# Patient Record
Sex: Male | Born: 1940
Health system: Southern US, Community
[De-identification: ages and names within clinical notes are randomized; demographics above are authoritative.]

## PROBLEM LIST (undated history)

## (undated) DIAGNOSIS — N289 Disorder of kidney and ureter, unspecified: Secondary | ICD-10-CM

## (undated) DIAGNOSIS — Z992 Dependence on renal dialysis: Secondary | ICD-10-CM

## (undated) DIAGNOSIS — I10 Essential (primary) hypertension: Secondary | ICD-10-CM

## (undated) DIAGNOSIS — I509 Heart failure, unspecified: Secondary | ICD-10-CM

## (undated) DIAGNOSIS — K219 Gastro-esophageal reflux disease without esophagitis: Secondary | ICD-10-CM

## (undated) DIAGNOSIS — I639 Cerebral infarction, unspecified: Secondary | ICD-10-CM

## (undated) DIAGNOSIS — I219 Acute myocardial infarction, unspecified: Secondary | ICD-10-CM

## (undated) DIAGNOSIS — H409 Unspecified glaucoma: Secondary | ICD-10-CM

## (undated) DIAGNOSIS — H3530 Unspecified macular degeneration: Secondary | ICD-10-CM

## (undated) DIAGNOSIS — N186 End stage renal disease: Secondary | ICD-10-CM

## (undated) HISTORY — PX: APPENDECTOMY: SHX54

## (undated) HISTORY — PX: OTHER SURGICAL HISTORY: SHX169

## (undated) HISTORY — DX: Unspecified glaucoma: H40.9

## (undated) HISTORY — PX: CORONARY ARTERY BYPASS GRAFT: SHX141

## (undated) HISTORY — DX: Unspecified macular degeneration: H35.30

---

## 1998-01-10 HISTORY — PX: CORONARY ARTERY BYPASS GRAFT: SHX141

## 2010-02-10 HISTORY — PX: ABDOMINAL AORTIC ANEURYSM REPAIR: SUR1152

## 2014-05-08 ENCOUNTER — Other Ambulatory Visit (HOSPITAL_COMMUNITY): Payer: Self-pay | Admitting: Nephrology

## 2014-05-08 DIAGNOSIS — N289 Disorder of kidney and ureter, unspecified: Secondary | ICD-10-CM

## 2014-05-22 ENCOUNTER — Ambulatory Visit (HOSPITAL_COMMUNITY)
Admission: RE | Admit: 2014-05-22 | Discharge: 2014-05-22 | Disposition: A | Discharge status: Home / Self Care | Payer: Medicare Other | Source: Ambulatory Visit | Attending: Nephrology | Admitting: Nephrology

## 2014-05-22 DIAGNOSIS — N400 Enlarged prostate without lower urinary tract symptoms: Secondary | ICD-10-CM | POA: Diagnosis not present

## 2014-05-22 DIAGNOSIS — N289 Disorder of kidney and ureter, unspecified: Secondary | ICD-10-CM

## 2014-05-22 DIAGNOSIS — N183 Chronic kidney disease, stage 3 (moderate): Secondary | ICD-10-CM | POA: Insufficient documentation

## 2014-06-14 DIAGNOSIS — R809 Proteinuria, unspecified: Secondary | ICD-10-CM | POA: Diagnosis not present

## 2014-06-14 DIAGNOSIS — I10 Essential (primary) hypertension: Secondary | ICD-10-CM | POA: Diagnosis not present

## 2014-06-14 DIAGNOSIS — N184 Chronic kidney disease, stage 4 (severe): Secondary | ICD-10-CM | POA: Diagnosis not present

## 2014-06-23 DIAGNOSIS — I10 Essential (primary) hypertension: Secondary | ICD-10-CM | POA: Diagnosis not present

## 2014-06-23 DIAGNOSIS — R972 Elevated prostate specific antigen [PSA]: Secondary | ICD-10-CM | POA: Diagnosis not present

## 2014-06-23 DIAGNOSIS — N189 Chronic kidney disease, unspecified: Secondary | ICD-10-CM | POA: Diagnosis not present

## 2014-06-23 DIAGNOSIS — E785 Hyperlipidemia, unspecified: Secondary | ICD-10-CM | POA: Diagnosis not present

## 2014-06-26 DIAGNOSIS — N184 Chronic kidney disease, stage 4 (severe): Secondary | ICD-10-CM | POA: Diagnosis not present

## 2014-06-26 DIAGNOSIS — N429 Disorder of prostate, unspecified: Secondary | ICD-10-CM | POA: Diagnosis not present

## 2014-06-26 DIAGNOSIS — E785 Hyperlipidemia, unspecified: Secondary | ICD-10-CM | POA: Diagnosis not present

## 2014-06-26 DIAGNOSIS — I10 Essential (primary) hypertension: Secondary | ICD-10-CM | POA: Diagnosis not present

## 2014-09-25 DIAGNOSIS — Z79899 Other long term (current) drug therapy: Secondary | ICD-10-CM | POA: Diagnosis not present

## 2014-09-25 DIAGNOSIS — R809 Proteinuria, unspecified: Secondary | ICD-10-CM | POA: Diagnosis not present

## 2014-09-25 DIAGNOSIS — N183 Chronic kidney disease, stage 3 (moderate): Secondary | ICD-10-CM | POA: Diagnosis not present

## 2014-09-25 DIAGNOSIS — I10 Essential (primary) hypertension: Secondary | ICD-10-CM | POA: Diagnosis not present

## 2014-09-25 DIAGNOSIS — N400 Enlarged prostate without lower urinary tract symptoms: Secondary | ICD-10-CM | POA: Diagnosis not present

## 2014-09-25 DIAGNOSIS — E559 Vitamin D deficiency, unspecified: Secondary | ICD-10-CM | POA: Diagnosis not present

## 2014-09-27 DIAGNOSIS — I10 Essential (primary) hypertension: Secondary | ICD-10-CM | POA: Diagnosis not present

## 2014-09-27 DIAGNOSIS — N184 Chronic kidney disease, stage 4 (severe): Secondary | ICD-10-CM | POA: Diagnosis not present

## 2014-09-27 DIAGNOSIS — R809 Proteinuria, unspecified: Secondary | ICD-10-CM | POA: Diagnosis not present

## 2014-10-30 DIAGNOSIS — I10 Essential (primary) hypertension: Secondary | ICD-10-CM | POA: Diagnosis not present

## 2014-10-30 DIAGNOSIS — E782 Mixed hyperlipidemia: Secondary | ICD-10-CM | POA: Diagnosis not present

## 2014-11-01 DIAGNOSIS — E782 Mixed hyperlipidemia: Secondary | ICD-10-CM | POA: Diagnosis not present

## 2014-11-01 DIAGNOSIS — R972 Elevated prostate specific antigen [PSA]: Secondary | ICD-10-CM | POA: Diagnosis not present

## 2014-11-01 DIAGNOSIS — K210 Gastro-esophageal reflux disease with esophagitis: Secondary | ICD-10-CM | POA: Diagnosis not present

## 2014-11-01 DIAGNOSIS — I10 Essential (primary) hypertension: Secondary | ICD-10-CM | POA: Diagnosis not present

## 2014-11-29 DIAGNOSIS — I10 Essential (primary) hypertension: Secondary | ICD-10-CM | POA: Diagnosis not present

## 2014-11-29 DIAGNOSIS — N400 Enlarged prostate without lower urinary tract symptoms: Secondary | ICD-10-CM | POA: Diagnosis not present

## 2014-11-29 DIAGNOSIS — N184 Chronic kidney disease, stage 4 (severe): Secondary | ICD-10-CM | POA: Diagnosis not present

## 2014-11-29 DIAGNOSIS — R972 Elevated prostate specific antigen [PSA]: Secondary | ICD-10-CM | POA: Diagnosis not present

## 2014-12-06 ENCOUNTER — Ambulatory Visit: Payer: Medicare Other | Admitting: Urology

## 2015-01-01 DIAGNOSIS — R809 Proteinuria, unspecified: Secondary | ICD-10-CM | POA: Diagnosis not present

## 2015-01-01 DIAGNOSIS — N183 Chronic kidney disease, stage 3 (moderate): Secondary | ICD-10-CM | POA: Diagnosis not present

## 2015-01-01 DIAGNOSIS — E559 Vitamin D deficiency, unspecified: Secondary | ICD-10-CM | POA: Diagnosis not present

## 2015-01-01 DIAGNOSIS — D509 Iron deficiency anemia, unspecified: Secondary | ICD-10-CM | POA: Diagnosis not present

## 2015-01-01 DIAGNOSIS — D649 Anemia, unspecified: Secondary | ICD-10-CM | POA: Diagnosis not present

## 2015-01-03 DIAGNOSIS — N184 Chronic kidney disease, stage 4 (severe): Secondary | ICD-10-CM | POA: Diagnosis not present

## 2015-01-03 DIAGNOSIS — I10 Essential (primary) hypertension: Secondary | ICD-10-CM | POA: Diagnosis not present

## 2015-01-03 DIAGNOSIS — E559 Vitamin D deficiency, unspecified: Secondary | ICD-10-CM | POA: Diagnosis not present

## 2015-01-03 DIAGNOSIS — R809 Proteinuria, unspecified: Secondary | ICD-10-CM | POA: Diagnosis not present

## 2015-03-02 DIAGNOSIS — E785 Hyperlipidemia, unspecified: Secondary | ICD-10-CM | POA: Diagnosis not present

## 2015-03-02 DIAGNOSIS — I10 Essential (primary) hypertension: Secondary | ICD-10-CM | POA: Diagnosis not present

## 2015-04-07 DIAGNOSIS — E559 Vitamin D deficiency, unspecified: Secondary | ICD-10-CM | POA: Diagnosis not present

## 2015-04-07 DIAGNOSIS — Z79899 Other long term (current) drug therapy: Secondary | ICD-10-CM | POA: Diagnosis not present

## 2015-04-07 DIAGNOSIS — R809 Proteinuria, unspecified: Secondary | ICD-10-CM | POA: Diagnosis not present

## 2015-04-07 DIAGNOSIS — I10 Essential (primary) hypertension: Secondary | ICD-10-CM | POA: Diagnosis not present

## 2015-04-07 DIAGNOSIS — N183 Chronic kidney disease, stage 3 (moderate): Secondary | ICD-10-CM | POA: Diagnosis not present

## 2015-04-11 DIAGNOSIS — E559 Vitamin D deficiency, unspecified: Secondary | ICD-10-CM | POA: Diagnosis not present

## 2015-04-11 DIAGNOSIS — R809 Proteinuria, unspecified: Secondary | ICD-10-CM | POA: Diagnosis not present

## 2015-04-11 DIAGNOSIS — N184 Chronic kidney disease, stage 4 (severe): Secondary | ICD-10-CM | POA: Diagnosis not present

## 2015-04-11 DIAGNOSIS — I10 Essential (primary) hypertension: Secondary | ICD-10-CM | POA: Diagnosis not present

## 2015-05-05 DIAGNOSIS — N184 Chronic kidney disease, stage 4 (severe): Secondary | ICD-10-CM | POA: Diagnosis not present

## 2015-05-05 DIAGNOSIS — I10 Essential (primary) hypertension: Secondary | ICD-10-CM | POA: Diagnosis not present

## 2015-05-05 DIAGNOSIS — K219 Gastro-esophageal reflux disease without esophagitis: Secondary | ICD-10-CM | POA: Diagnosis not present

## 2015-05-05 DIAGNOSIS — E785 Hyperlipidemia, unspecified: Secondary | ICD-10-CM | POA: Diagnosis not present

## 2015-07-23 DIAGNOSIS — Z79899 Other long term (current) drug therapy: Secondary | ICD-10-CM | POA: Diagnosis not present

## 2015-07-23 DIAGNOSIS — E559 Vitamin D deficiency, unspecified: Secondary | ICD-10-CM | POA: Diagnosis not present

## 2015-07-23 DIAGNOSIS — D509 Iron deficiency anemia, unspecified: Secondary | ICD-10-CM | POA: Diagnosis not present

## 2015-07-23 DIAGNOSIS — R809 Proteinuria, unspecified: Secondary | ICD-10-CM | POA: Diagnosis not present

## 2015-07-23 DIAGNOSIS — N183 Chronic kidney disease, stage 3 (moderate): Secondary | ICD-10-CM | POA: Diagnosis not present

## 2015-07-25 DIAGNOSIS — I10 Essential (primary) hypertension: Secondary | ICD-10-CM | POA: Diagnosis not present

## 2015-07-25 DIAGNOSIS — N184 Chronic kidney disease, stage 4 (severe): Secondary | ICD-10-CM | POA: Diagnosis not present

## 2015-07-25 DIAGNOSIS — R809 Proteinuria, unspecified: Secondary | ICD-10-CM | POA: Diagnosis not present

## 2015-07-25 DIAGNOSIS — E559 Vitamin D deficiency, unspecified: Secondary | ICD-10-CM | POA: Diagnosis not present

## 2015-08-30 DIAGNOSIS — K4020 Bilateral inguinal hernia, without obstruction or gangrene, not specified as recurrent: Secondary | ICD-10-CM | POA: Diagnosis not present

## 2015-08-30 DIAGNOSIS — K4090 Unilateral inguinal hernia, without obstruction or gangrene, not specified as recurrent: Secondary | ICD-10-CM | POA: Diagnosis not present

## 2015-09-11 DIAGNOSIS — K4090 Unilateral inguinal hernia, without obstruction or gangrene, not specified as recurrent: Secondary | ICD-10-CM | POA: Diagnosis not present

## 2015-09-12 ENCOUNTER — Encounter (HOSPITAL_COMMUNITY)
Admission: RE | Admit: 2015-09-12 | Discharge: 2015-09-12 | Disposition: A | Discharge status: Home / Self Care | Payer: Medicare Other | Source: Ambulatory Visit | Attending: Surgery | Admitting: Surgery

## 2015-09-12 ENCOUNTER — Encounter (HOSPITAL_COMMUNITY): Payer: Self-pay

## 2015-09-12 DIAGNOSIS — Z01812 Encounter for preprocedural laboratory examination: Secondary | ICD-10-CM | POA: Insufficient documentation

## 2015-09-12 DIAGNOSIS — Z01810 Encounter for preprocedural cardiovascular examination: Secondary | ICD-10-CM | POA: Diagnosis not present

## 2015-09-12 HISTORY — DX: Cerebral infarction, unspecified: I63.9

## 2015-09-12 HISTORY — DX: Gastro-esophageal reflux disease without esophagitis: K21.9

## 2015-09-12 HISTORY — DX: Essential (primary) hypertension: I10

## 2015-09-12 HISTORY — DX: Acute myocardial infarction, unspecified: I21.9

## 2015-09-12 LAB — COMPREHENSIVE METABOLIC PANEL
ALK PHOS: 63 U/L (ref 38–126)
ALT: 17 U/L (ref 17–63)
ANION GAP: 5 (ref 5–15)
AST: 16 U/L (ref 15–41)
Albumin: 3.7 g/dL (ref 3.5–5.0)
BILIRUBIN TOTAL: 0.5 mg/dL (ref 0.3–1.2)
BUN: 30 mg/dL — ABNORMAL HIGH (ref 6–20)
CALCIUM: 8.9 mg/dL (ref 8.9–10.3)
CO2: 25 mmol/L (ref 22–32)
Chloride: 107 mmol/L (ref 101–111)
Creatinine, Ser: 2.11 mg/dL — ABNORMAL HIGH (ref 0.61–1.24)
GFR, EST AFRICAN AMERICAN: 34 mL/min — AB (ref >60)
GFR, EST NON AFRICAN AMERICAN: 29 mL/min — AB (ref >60)
Glucose, Bld: 84 mg/dL (ref 65–99)
POTASSIUM: 4 mmol/L (ref 3.5–5.1)
Sodium: 137 mmol/L (ref 135–145)
TOTAL PROTEIN: 6.8 g/dL (ref 6.5–8.1)

## 2015-09-12 LAB — CBC WITH DIFFERENTIAL/PLATELET
BASOS PCT: 0 %
Basophils Absolute: 0 10*3/uL (ref 0.0–0.1)
EOS ABS: 0.2 10*3/uL (ref 0.0–0.7)
Eosinophils Relative: 3 %
HEMATOCRIT: 42.3 % (ref 39.0–52.0)
HEMOGLOBIN: 14.4 g/dL (ref 13.0–17.0)
LYMPHS ABS: 1.3 10*3/uL (ref 0.7–4.0)
Lymphocytes Relative: 19 %
MCH: 30.1 pg (ref 26.0–34.0)
MCHC: 34 g/dL (ref 30.0–36.0)
MCV: 88.3 fL (ref 78.0–100.0)
MONO ABS: 0.6 10*3/uL (ref 0.1–1.0)
MONOS PCT: 8 %
NEUTROS ABS: 4.6 10*3/uL (ref 1.7–7.7)
NEUTROS PCT: 70 %
Platelets: 151 10*3/uL (ref 150–400)
RBC: 4.79 MIL/uL (ref 4.22–5.81)
RDW: 14.2 % (ref 11.5–15.5)
WBC: 6.7 10*3/uL (ref 4.0–10.5)

## 2015-09-12 NOTE — Pre-Procedure Instructions (Signed)
Patient in for PAT. He is on plavix and states he was no instructed to stop this. Dr Rosana Hoes called and states he did not know patient was on plavix. Patient took plavix this morning and will need to be rescheduled to next week due to this. Surgery rescheduled until 09/20/2015.  Patient and wife given verbal and written instructions to stop plavix and take no more prior to surgery. Both verbalized understanding of this.

## 2015-09-12 NOTE — Pre-Procedure Instructions (Signed)
Patient given information to sign up for my chart at home. 

## 2015-09-12 NOTE — Patient Instructions (Addendum)
Cornellius Limpert Knickerbocker  09/12/2015     @PREFPERIOPPHARMACY @   Your procedure is scheduled on  8/10 /2017   Report to [facility] at  615  A.M.  Call this number if you have problems the morning of surgery:  [phone]   Remember:  Do not eat food or drink liquids after midnight.  Take these medicines the morning of surgery with A SIP OF WATER :norvasc, coreg, lisinpril, protonix, vesicare, ultram. DO NOT take any more plavix before surgery.   Do not wear jewelry, make-up or nail polish.  Do not wear lotions, powders, or perfumes.  You may wear deoderant.  Do not shave 48 hours prior to surgery.  Men may shave face and neck.  Do not bring valuables to the hospital.  [facility] is not responsible for any belongings or valuables.  Contacts, dentures or bridgework may not be worn into surgery.  Leave your suitcase in the car.  After surgery it may be brought to your room.  For patients admitted to the hospital, discharge time will be determined by your treatment team.  Patients discharged the day of surgery will not be allowed to drive home.   Name and phone number of your driver:   family Special instructions:  none  Please read over the following fact sheets that you were given. Coughing and Deep Breathing, Surgical Site Infection Prevention, Anesthesia Post-op Instructions and Care and Recovery After Surgery      Open Hernia Repair Open hernia repair is surgery to fix a hernia. A hernia occurs when an internal organ or tissue pushes out through a weak spot in the abdominal wall muscles. Hernias commonly occur in the groin and around the navel. Most hernias tend to get worse over time. Surgery is often done to prevent the hernia from getting bigger, becoming uncomfortable, or becoming an emergency. Emergency surgery may be needed if abdominal contents get stuck in the opening (incarcerated hernia) or the blood supply gets cut off (strangulated hernia). In an open  repair, a large cut (incision) is made in the abdomen to perform the surgery. LET [facility] CARE PROVIDER KNOW ABOUT:  Any allergies you have.  All medicines you are taking, including vitamins, herbs, eye drops, creams, and over-the-counter medicines.  Previous problems you or members of your family have had with the use of anesthetics.  Any blood disorders you have.  Previous surgeries you have had.  Medical conditions you have. RISKS AND COMPLICATIONS Generally, this is a safe procedure. However, as with any procedure, complications can occur. Possible complications include:  Infection.  Bleeding.  Nerve injury.  Chronic pain.  The hernia can come back.  Injury to the intestines. BEFORE THE PROCEDURE  Ask your health care provider about changing or stopping any regular medicines. Avoid taking aspirin or blood thinners as directed by your health care provider.  Do noteat or drink anything after midnight the night before surgery.  If you smoke, do not smoke for at least 2 weeks before your surgery.  Do not drink alcohol the day before your surgery.  Let your health care provider know if you develop a cold or any infection before your surgery.  Arrange for someone to drive you home after the procedure or after your hospital stay. Also arrange for someone to help you with activities during recovery. PROCEDURE   Small monitors will be put on your body. They are used to check your heart, blood pressure,  and oxygen level.   An IV access tube will be put into one of your veins. Medicine will be able to flow directly into your body through this IV tube.   You might be given a medicine to help you relax (sedative).   You will be given a medicine to make you sleep (general anesthetic). A breathing tube may be placed into your lungs during the procedure.  A cut (incision) is made over the hernia defect, and the contents are pushed back into the abdomen.  If the hernia  is small, stitches may be used to bring the muscle edges back together.  Typically, a surgeon will place a mesh patch made of man-made material (synthetic) to cover the defect. The mesh is sewn to healthy muscle. This reduces the risk of the hernia coming back.  The tissue and skin over the hernia are then closed with stitches or staples.  If the hernia was large, a drain may be left in place to collect excess fluid where the hernia used to be.  Bandages (dressings) are used to cover the incision. AFTER THE PROCEDURE  You will be taken to a recovery area where your progress will be monitored.  If the hernia was small or in the groin (inguinal) region, you will likely be allowed to go home once you are awake, stable, and taking fluids well.  If the hernia was large, you may have to wait for your bowel function to return. You may need to stay in the hospital for 2-3 days until you can eat and your pain is controlled. A drain may be left in place for 5-7 days. You will be taught how to care for the drain.   This information is not intended to replace advice given to you by your health care provider. Make sure you discuss any questions you have with your health care provider.   Document Released: 07/23/2000 Document Revised: 11/17/2012 Document Reviewed: 09/08/2012 Elsevier Interactive Patient Education 2016 [place].  Open Hernia Repair, Care After These instructions give you information about caring for yourself after your procedure. Your doctor may also give you more specific instructions. Call your doctor if you have any problems or questions after your procedure. HOME CARE  Keep the cut (incision) area clean and dry. You may gently wash the incision area with soap and water 48 hours after surgery. To dry the incision area, gently blot or dab it.  Do not take baths, swim, or use a hot tub for 10 days or until your doctor approves.  Change bandages (dressings) as told by your  doctor.  Check your incision area every day for signs of infection. Watch for:  Redness, swelling, or pain.  Fluid , blood, or pus.  Eat plenty of fruits and vegetables. This helps to prevent constipation.  Drink enough fluid to keep your pee (urine) clear or pale yellow. This also helps to prevent constipation.  Do not drive or operate heavy machinery until your doctor says it is okay.  Do not lift anything that is heavier than 10 lb (4.5 kg) until your doctor approves.  Do not play contact sports for 4 weeks or until your doctor approves.  Take medicines only as told by your doctor.  Keep all follow-up visits as told by your doctor. This is important. Ask your doctor when to make an appointment to have your stitches (sutures) or staples removed. GET HELP IF:  The incision is bleeding more than before.  You have blood  in your poop (stool).  The incision hurts more than before.  You have redness, swelling, or pain in your incision area.  You have fluid, blood, or pus coming from your incision.  You have a fever.  You notice a bad smell coming from the incision area or the dressing. GET HELP RIGHT AWAY IF:  You have a rash.  Your chest hurts.  You are short of breath.  You feel light-headed.  You feel weak and dizzy (feel faint).   This information is not intended to replace advice given to you by your health care provider. Make sure you discuss any questions you have with your health care provider.   Document Released: 02/17/2014 Document Reviewed: 02/17/2014 Elsevier Interactive Patient Education 2016 [employer] PATIENT INSTRUCTIONS POST-ANESTHESIA  IMMEDIATELY FOLLOWING SURGERY:  Do not drive or operate machinery for the first twenty four hours after surgery.  Do not make any important decisions for twenty four hours after surgery or while taking narcotic pain medications or sedatives.  If you develop intractable nausea and vomiting or a severe headache  please notify your doctor immediately.  FOLLOW-UP:  Please make an appointment with your surgeon as instructed. You do not need to follow up with anesthesia unless specifically instructed to do so.  WOUND CARE INSTRUCTIONS (if applicable):  Keep a dry clean dressing on the anesthesia/puncture wound site if there is drainage.  Once the wound has quit draining you may leave it open to air.  Generally you should leave the bandage intact for twenty four hours unless there is drainage.  If the epidural site drains for more than 36-48 hours please call the anesthesia department.  QUESTIONS?:  Please feel free to call your physician or the hospital operator if you have any questions, and they will be happy to assist you.

## 2015-09-18 NOTE — H&P (Signed)
Surgical History and Physical  Patient Name: Ronald [name] Date of Birth: 1940/04/02  Subjective: This 75 year old male presents for followup. During his first (previous) visit for evaluation of a painful Right inguinal hernia, he expressed relief to know and to be shown that his hernia could easily be reduced and self-reduced. Since that visit, patient reports he's been reducing his hernia frequently and hasn't experienced any further pain associated with the hernia, but he says that he is tired of reducing it so frequently and just wants it fixed. Patient denies any change in bowel function or fever/chills. Though he underwent CABG 18 years ago, he denies any CP or SOB with walking his dog for what he describes to be up to 1-2 miles and describes that he hasn't seen a cardiologist for >3 years.   (Prior Subjective) 75 year old male presents for Right groin pain x 3 days that began after some heavy lifting in his yard. He reports that he's never before experienced pain like this and that nothing has helped improve the pain, while straining, moving, and lifting all worsen the pain. He denies any nausea/vomiting or fever/chills during this time and reports +flatus and normal BM. Patient saw his PCP this morning and was referred for further evaluation.Patient denies any constipation or straining with bowel movements and denies any recent coughing. He does report some recent straining with urination, but states that it resolved with an over-the-counter medication of which he coulnd't remember its name. He also reports that he has never had a colonoscopy.   Review of Symptoms:  Constitutional:No fevers, chills, or unexplained weight loss Head:Atraumatic; no masses; no abnormalities Eyes:No visual changes or eye pain Nose/Mouth/Throat:No nasal congestion, rhinorrhea, oral lesions, postnasal drip or sore throat  Cardiovascular: No chest pain or palpitations.  Respiratory:No cough,  shortness of breath or wheezing   Right groin pain as per HPI Genitourinary:No urinary frequency, hematuria, incontinence, or dysuria Musculoskeletal:No arthalgias, myalgias or joint swelling Skin:No rash or bothersome skin lesions Hematolgic/Lymphatic:No easy bruising, easy bleeding or swollen glands   Past Medical History:Reviewed  Past Medical History  Surgical History:  CABG (1999), an unspecified catheter-based vascular procedure through B/L groin incisions that was reportedly aborted after patient reportedly experienced an intra-procedural stroke Medical Problems:  CAD, CKD (stage 4), HTN, hyperlipidemia, GERD, BPH, elevated PSA   Social History:Reviewed  Social History  Preferred Language: English Race:  White Ethnicity: Not Hispanic / Latino Age: 37 year Marital Status:  M Smoking Status: Current every day smoker reviewed on 08/30/2015 Started Date: [birth year]30/1953  Functional Status ------------------------------------------------ Bathing: Normal Cooking: Normal Dressing: Normal Driving: Normal Eating: Normal Managing Meds: Normal Oral Care: Normal Shopping: Normal Toileting: Normal Transferring: Normal Walking: Normal  Cognitive Status ------------------------------------------------ Attention: Normal Decision Making: Normal Language: Normal Memory: Normal Motor: Normal Perception: Normal Problem Solving: Normal Visual and Spatial: Normal   Family History:Reviewed  Family Health History Both of patient's parents are deceased, but Family History is otherwise unknown  Vital Signs as of [id number]:  Systolic [id number]: Diastolic 70: Heart Rate 59: Temp 36.89C (Temporal) Height 185CM: Weight 82.1KG: Pain Level 2   BMI: 23.88 kg/m2  Physical Exam: General:Well appearing, well nourished in no distress. Skin:no rash or prominent lesions Head:Atraumatic; no masses; no abnormalities Eyes:conjunctiva clear, EOM intact, PERRL Mouth:Mucous  membranes moist, no mucosal lesions. Throat:no erythema, exudates or lesions. Neck:Supple without lymphadenopathy.  Heart:RRR, no murmur Lungs:CTA bilaterally, no wheezes, rhonchi, rales.  Breathing unlabored. Abdomen:Soft, NT/ND, no HSM, no masses.Easily reduced Right inguinal hernia,  tender prior to reduction, completely non-tender even with firm pressure applied following reduction, felt on exam to be mostly likely consistent with an indirect hernia Extremities:No deformities, clubbing, cyanosis, or edema.   Assessment:  75 year old Male with symptomatic (painful), though easily reducible, Right inguinal hernia, complicated by history including CAD s/p CABG 18 years ago, but no CP, SOB, or recent cardiac or coronary studies or interventions.  Plan:       - screening colonoscopy long overdue and recommended      - hold Plavix x 5 - 7 days prior to elective inguinal hernia repair      - call office or go to ED if can't reduce hernia or if pain + loss of bowel function      - all risks, benefits, and alternatives to above elective procedure were discussed with the patient, who elects to proceed, all of his questions were answered to his expressed satisfaction, and informed consent was obtained      - patient states that he smokes very little any longer, but complete smoking cessation again reiterated, both for long-term health benefits and to reduce peri-operative risk of pulmonary and wound complications      - will plan for Right inguinal hernia repair with mesh next week      - surgical follow-up 2 weeks after planned surgery  -- Corene Cornea E. Rosana Hoes, MD, [place]: [place] [facility] Office #: [phone]

## 2015-09-20 ENCOUNTER — Ambulatory Visit (HOSPITAL_COMMUNITY)
Admission: RE | Admit: 2015-09-20 | Discharge: 2015-09-20 | Disposition: A | Discharge status: Home / Self Care | Payer: Medicare Other | Source: Ambulatory Visit | Attending: Surgery | Admitting: Surgery

## 2015-09-20 ENCOUNTER — Encounter (HOSPITAL_COMMUNITY)
Admission: RE | Disposition: A | Discharge status: Home / Self Care | Payer: Self-pay | Source: Ambulatory Visit | Attending: Surgery

## 2015-09-20 ENCOUNTER — Ambulatory Visit (HOSPITAL_COMMUNITY): Payer: Medicare Other | Admitting: Anesthesiology

## 2015-09-20 ENCOUNTER — Encounter (HOSPITAL_COMMUNITY): Payer: Self-pay | Admitting: *Deleted

## 2015-09-20 DIAGNOSIS — I129 Hypertensive chronic kidney disease with stage 1 through stage 4 chronic kidney disease, or unspecified chronic kidney disease: Secondary | ICD-10-CM | POA: Insufficient documentation

## 2015-09-20 DIAGNOSIS — N184 Chronic kidney disease, stage 4 (severe): Secondary | ICD-10-CM | POA: Insufficient documentation

## 2015-09-20 DIAGNOSIS — I252 Old myocardial infarction: Secondary | ICD-10-CM | POA: Insufficient documentation

## 2015-09-20 DIAGNOSIS — K4090 Unilateral inguinal hernia, without obstruction or gangrene, not specified as recurrent: Secondary | ICD-10-CM | POA: Diagnosis not present

## 2015-09-20 DIAGNOSIS — I2510 Atherosclerotic heart disease of native coronary artery without angina pectoris: Secondary | ICD-10-CM | POA: Insufficient documentation

## 2015-09-20 DIAGNOSIS — F17210 Nicotine dependence, cigarettes, uncomplicated: Secondary | ICD-10-CM | POA: Insufficient documentation

## 2015-09-20 DIAGNOSIS — K219 Gastro-esophageal reflux disease without esophagitis: Secondary | ICD-10-CM | POA: Insufficient documentation

## 2015-09-20 DIAGNOSIS — I739 Peripheral vascular disease, unspecified: Secondary | ICD-10-CM | POA: Insufficient documentation

## 2015-09-20 DIAGNOSIS — E785 Hyperlipidemia, unspecified: Secondary | ICD-10-CM | POA: Diagnosis not present

## 2015-09-20 DIAGNOSIS — Z951 Presence of aortocoronary bypass graft: Secondary | ICD-10-CM | POA: Diagnosis not present

## 2015-09-20 DIAGNOSIS — Z7902 Long term (current) use of antithrombotics/antiplatelets: Secondary | ICD-10-CM | POA: Diagnosis not present

## 2015-09-20 DIAGNOSIS — Z8673 Personal history of transient ischemic attack (TIA), and cerebral infarction without residual deficits: Secondary | ICD-10-CM | POA: Insufficient documentation

## 2015-09-20 HISTORY — PX: INGUINAL HERNIA REPAIR: SHX194

## 2015-09-20 SURGERY — REPAIR, HERNIA, INGUINAL, ADULT
Anesthesia: General | Site: Groin | Laterality: Right

## 2015-09-20 MED ORDER — BUPIVACAINE HCL (PF) 0.5 % IJ SOLN
INTRAMUSCULAR | Status: AC
  Filled 2015-09-20: qty 30

## 2015-09-20 MED ORDER — FENTANYL CITRATE (PF) 100 MCG/2ML IJ SOLN
INTRAMUSCULAR | Status: AC
  Filled 2015-09-20: qty 2

## 2015-09-20 MED ORDER — HYDRALAZINE HCL 20 MG/ML IJ SOLN
INTRAMUSCULAR | Status: AC
  Filled 2015-09-20: qty 1

## 2015-09-20 MED ORDER — LIDOCAINE HCL (CARDIAC) 20 MG/ML IV SOLN
INTRAVENOUS | Status: DC | PRN
  Administered 2015-09-20: 25 mg via INTRAVENOUS

## 2015-09-20 MED ORDER — ONDANSETRON HCL 4 MG/2ML IJ SOLN: 4.0000 mg | Freq: Once | INTRAMUSCULAR | Status: DC | PRN

## 2015-09-20 MED ORDER — SUCCINYLCHOLINE CHLORIDE 20 MG/ML IJ SOLN
INTRAMUSCULAR | Status: AC
  Filled 2015-09-20: qty 1

## 2015-09-20 MED ORDER — LACTATED RINGERS IV SOLN
INTRAVENOUS | Status: DC
  Administered 2015-09-20: 1000 mL via INTRAVENOUS

## 2015-09-20 MED ORDER — SODIUM CHLORIDE 0.9 % IR SOLN
Status: DC | PRN
  Administered 2015-09-20: 500 mL

## 2015-09-20 MED ORDER — CHLORHEXIDINE GLUCONATE CLOTH 2 % EX PADS: 6.0000 | MEDICATED_PAD | Freq: Once | CUTANEOUS | Status: DC

## 2015-09-20 MED ORDER — FENTANYL CITRATE (PF) 100 MCG/2ML IJ SOLN
25.0000 ug | INTRAMUSCULAR | Status: DC | PRN
  Administered 2015-09-20 (×2): 50 ug via INTRAVENOUS

## 2015-09-20 MED ORDER — CEFAZOLIN SODIUM-DEXTROSE 2-4 GM/100ML-% IV SOLN
2.0000 g | INTRAVENOUS | Status: AC
  Administered 2015-09-20: 2 g via INTRAVENOUS

## 2015-09-20 MED ORDER — HYDRALAZINE HCL 20 MG/ML IJ SOLN
5.0000 mg | Freq: Once | INTRAMUSCULAR | Status: AC
  Administered 2015-09-20: 5 mg via INTRAVENOUS

## 2015-09-20 MED ORDER — LABETALOL HCL 5 MG/ML IV SOLN
5.0000 mg | Freq: Once | INTRAVENOUS | Status: AC
  Administered 2015-09-20: 5 mg via INTRAVENOUS

## 2015-09-20 MED ORDER — EPHEDRINE SULFATE 50 MG/ML IJ SOLN
INTRAMUSCULAR | Status: AC
  Filled 2015-09-20: qty 1

## 2015-09-20 MED ORDER — GLYCOPYRROLATE 0.2 MG/ML IJ SOLN
INTRAMUSCULAR | Status: DC | PRN
  Administered 2015-09-20: 0.2 mg via INTRAVENOUS

## 2015-09-20 MED ORDER — PHENYLEPHRINE 40 MCG/ML (10ML) SYRINGE FOR IV PUSH (FOR BLOOD PRESSURE SUPPORT)
PREFILLED_SYRINGE | INTRAVENOUS | Status: AC
  Filled 2015-09-20: qty 10

## 2015-09-20 MED ORDER — TRAMADOL HCL 50 MG PO TABS: 50.0000 mg | ORAL_TABLET | Freq: Four times a day (QID) | ORAL | 0 refills | Status: DC | PRN

## 2015-09-20 MED ORDER — CEFAZOLIN SODIUM-DEXTROSE 2-4 GM/100ML-% IV SOLN
INTRAVENOUS | Status: AC
  Filled 2015-09-20: qty 100

## 2015-09-20 MED ORDER — MIDAZOLAM HCL 2 MG/2ML IJ SOLN
INTRAMUSCULAR | Status: AC
  Filled 2015-09-20: qty 2

## 2015-09-20 MED ORDER — PROPOFOL 10 MG/ML IV BOLUS
INTRAVENOUS | Status: AC
  Filled 2015-09-20: qty 40

## 2015-09-20 MED ORDER — LABETALOL HCL 5 MG/ML IV SOLN
INTRAVENOUS | Status: AC
  Filled 2015-09-20: qty 4

## 2015-09-20 MED ORDER — LIDOCAINE HCL (PF) 1 % IJ SOLN
INTRAMUSCULAR | Status: AC
  Filled 2015-09-20: qty 5

## 2015-09-20 MED ORDER — SODIUM CHLORIDE 0.9 % IJ SOLN
INTRAMUSCULAR | Status: AC
  Filled 2015-09-20: qty 10

## 2015-09-20 MED ORDER — LACTATED RINGERS IV SOLN
INTRAVENOUS | Status: DC | PRN
  Administered 2015-09-20: 07:00:00 via INTRAVENOUS

## 2015-09-20 MED ORDER — FENTANYL CITRATE (PF) 100 MCG/2ML IJ SOLN
INTRAMUSCULAR | Status: DC | PRN
  Administered 2015-09-20 (×2): 25 ug via INTRAVENOUS
  Administered 2015-09-20: 50 ug via INTRAVENOUS

## 2015-09-20 MED ORDER — LIDOCAINE HCL 1 % IJ SOLN
INTRAMUSCULAR | Status: DC | PRN
  Administered 2015-09-20: 20 mL via INTRAMUSCULAR

## 2015-09-20 MED ORDER — PROPOFOL 10 MG/ML IV BOLUS
INTRAVENOUS | Status: DC | PRN
  Administered 2015-09-20: 130 mg via INTRAVENOUS

## 2015-09-20 MED ORDER — LIDOCAINE HCL (PF) 1 % IJ SOLN
INTRAMUSCULAR | Status: AC
  Filled 2015-09-20: qty 30

## 2015-09-20 MED ORDER — MIDAZOLAM HCL 2 MG/2ML IJ SOLN
1.0000 mg | INTRAMUSCULAR | Status: DC | PRN
Start: 2015-09-20 — End: 2015-09-22
  Administered 2015-09-20: 2 mg via INTRAVENOUS

## 2015-09-20 MED ORDER — FENTANYL CITRATE (PF) 100 MCG/2ML IJ SOLN
25.0000 ug | INTRAMUSCULAR | Status: AC | PRN
  Administered 2015-09-20 (×2): 25 ug via INTRAVENOUS

## 2015-09-20 SURGICAL SUPPLY — 42 items
BAG HAMPER (MISCELLANEOUS) ×2 IMPLANT
BLADE SURG SZ10 CARB STEEL (BLADE) ×2 IMPLANT
CHLORAPREP W/TINT 26ML (MISCELLANEOUS) ×4 IMPLANT
CLOTH BEACON ORANGE TIMEOUT ST (SAFETY) ×2 IMPLANT
COVER LIGHT HANDLE STERIS (MISCELLANEOUS) ×4 IMPLANT
DECANTER SPIKE VIAL GLASS SM (MISCELLANEOUS) ×4 IMPLANT
DERMABOND ADVANCED (GAUZE/BANDAGES/DRESSINGS) ×1
DERMABOND ADVANCED .7 DNX12 (GAUZE/BANDAGES/DRESSINGS) ×1 IMPLANT
DRAIN PENROSE 18X.75 LTX STRL (MISCELLANEOUS) ×2 IMPLANT
ELECT REM PT RETURN 9FT ADLT (ELECTROSURGICAL) ×2
ELECTRODE REM PT RTRN 9FT ADLT (ELECTROSURGICAL) ×1 IMPLANT
FORMALIN 10 PREFIL 120ML (MISCELLANEOUS) ×2 IMPLANT
GLOVE BIOGEL PI IND STRL 7.0 (GLOVE) ×1 IMPLANT
GLOVE BIOGEL PI IND STRL 7.5 (GLOVE) ×1 IMPLANT
GLOVE BIOGEL PI INDICATOR 7.0 (GLOVE) ×1
GLOVE BIOGEL PI INDICATOR 7.5 (GLOVE) ×1
GLOVE ECLIPSE 6.5 STRL STRAW (GLOVE) ×2 IMPLANT
GLOVE ECLIPSE 7.0 STRL STRAW (GLOVE) ×2 IMPLANT
GLOVE ECLIPSE 8.0 STRL XLNG CF (GLOVE) ×2 IMPLANT
GOWN STRL REUS W/TWL LRG LVL3 (GOWN DISPOSABLE) ×6 IMPLANT
INST SET MINOR GENERAL (KITS) ×2 IMPLANT
KIT ROOM TURNOVER APOR (KITS) ×2 IMPLANT
MANIFOLD NEPTUNE II (INSTRUMENTS) ×2 IMPLANT
MESH MARLEX PLUG MEDIUM (Mesh General) ×2 IMPLANT
NS IRRIG 1000ML POUR BTL (IV SOLUTION) ×2 IMPLANT
PACK MINOR (CUSTOM PROCEDURE TRAY) ×2 IMPLANT
PAD ARMBOARD 7.5X6 YLW CONV (MISCELLANEOUS) ×2 IMPLANT
SET BASIN LINEN APH (SET/KITS/TRAYS/PACK) ×2 IMPLANT
STRIP CLOSURE SKIN 1/2X4 (GAUZE/BANDAGES/DRESSINGS) IMPLANT
SUT ETHIBOND CT1 BRD 2-0 30IN (SUTURE) IMPLANT
SUT ETHIBOND NAB MO 7 #0 18IN (SUTURE) ×2 IMPLANT
SUT MNCRL AB 4-0 PS2 18 (SUTURE) IMPLANT
SUT SILK 2 0 (SUTURE)
SUT SILK 2-0 18XBRD TIE 12 (SUTURE) IMPLANT
SUT VIC AB 2-0 CT1 27 (SUTURE) ×1
SUT VIC AB 2-0 CT1 TAPERPNT 27 (SUTURE) ×1 IMPLANT
SUT VIC AB 3-0 SH 27 (SUTURE) ×1
SUT VIC AB 3-0 SH 27X BRD (SUTURE) ×1 IMPLANT
SUT VIC AB 4-0 PS2 27 (SUTURE) ×2 IMPLANT
SUT VICRYL AB 3 0 TIES (SUTURE) IMPLANT
SYR BULB IRRIGATION 50ML (SYRINGE) IMPLANT
SYR CONTROL 10ML LL (SYRINGE) ×2 IMPLANT

## 2015-09-20 NOTE — Transfer of Care (Signed)
Immediate Anesthesia Transfer of Care Note  Patient: Ronald [name]  Procedure(s) Performed: Procedure(s): REPAIR RIGHT INGUINAL HERNIA  WITH MESH (Right)  Patient Location: PACU  Anesthesia Type:General  Level of Consciousness: awake, alert , oriented and patient cooperative  Airway & Oxygen Therapy: Patient Spontanous Breathing and Patient connected to face mask oxygen  Post-op Assessment: Report given to RN and Post -op Vital signs reviewed and stable  Post vital signs: Reviewed and stable  Last Vitals:  Vitals:   09/20/15 0640  BP: (!) 180/77  Pulse: (!) 57  Resp: (!) 21  Temp: 36.4 C    Last Pain:  Vitals:   09/20/15 0643  TempSrc:   PainSc: 2          Complications: No apparent anesthesia complications

## 2015-09-20 NOTE — Discharge Instructions (Signed)
In addition to included general post-operative instructions for Inguinal Hernia Repair with Mesh,  Diet: Resume home heart healthy diet.   Activity: No heavy lifting (children, pets, laundry) or strenuous activity until follow-up, but light activity and walking are encouraged. Do not drive or drink alcohol if taking narcotic pain medications.   Wound care: 2 days after surgery (Saturday, 8/12), may shower/get incision wet with soapy water and pat dry (do not rub incisions), but no baths or submerging incision underwater until follow-up.   Medications: Resume all home medications. For mild to moderate pain: acetaminophen (Tylenol) or ibuprofen (if no kidney disease). Narcotic pain medications, if prescribed, can be used for severe pain, though may cause nausea, constipation, and drowsiness. Do not combine Tylenol and Percocet within a 6 hour period as Percocet contains Tylenol.  Call office [phone]) at any time if any questions, worsening pain, fevers/chills, bleeding, drainage from incision site, or other concerns.

## 2015-09-20 NOTE — Op Note (Signed)
SURGICAL OPERATIVE REPORT  DATE OF PROCEDURE: 09/20/2015  ATTENDING Surgeon(s): Vickie Epley, MD   ANESTHESIA: GETA  PRE-OPERATIVE DIAGNOSIS: Symptomatic (painful) reducible Right inguinal hernia  POST-OPERATIVE DIAGNOSIS: Symptomatic (painful) reducible Right inguinal hernia  PROCEDURE(S):  1.) Repair of symptomatic (painful) reducible Right inguinal hernia with mesh  INTRAOPERATIVE FINDINGS: Right inguinal hernia containing peritoneal fat at the time of surgery  INTRAVENOUS FLUIDS: 600 mL crystalloid   ESTIMATED BLOOD LOSS: Minimal (<20 mL)  URINE OUTPUT: No foley  SPECIMENS: None  IMPLANTS: Bard PerFix mesh plug and Bard Soft polypropylene mesh patch  DRAINS: None   COMPLICATIONS: None apparent   CONDITION AT END OF PROCEDURE: Hemodynamically stable and extubated   DISPOSITION OF PATIENT: PACU  INDICATIONS FOR PROCEDURE:   Patient is a 75 y.o. male who presented with Right groin pain x many years that has recently been causing him more discomfort. On exam, an easily reducible Right inguinal hernia was identified, contributing factors were assessed, patient was taught to self-reduce the hernia, and operative repair was offered. Patient is also now scheduled for a long-overdue colonoscopy. All risks, benefits, and alternatives to above procedure were discussed with the patient, all of patient's questions were answered to his expressed satisfaction, and informed consent was obtained and documented.  DETAILS OF PROCEDURE: Patient was brought to the operating suite and appropriately identified. General anesthesia was administered along with appropriate pre-operative antibiotics, and endotracheal intubation was performed by anesthetist. In supine position, operative site was prepped and draped in the usual sterile fashion, and following a brief time out, local anesthetic was injected over the planned incision site and initial transverse skin incision was made along a  natural skin crease using a #15 blade scalpel 1-2 cm superior to the inguinal ligament as determined by identifying and marking the anterior superior iliac spine and pubic tubercle. This incision was then extended deep through subcutaneous tissues, Scarpa's and Camper's fascia, using electrocautery until external oblique aponeurosis was encountered and the external ring was exposed. A small incision was made in the mid- external oblique aponeurosis in the direction of its fibers, elevated off underlying structures, and extended medially and laterally. The ilioinguinal nerve was identified and protected throughout the remainder of dissection and procedure, and superior and inferior external oblique flaps were developed bluntly.  The spermatic cord was safely encircled with a penrose drain and carefully dissected free from the anteromedial hernia sac, which was reduced. Attention was then directed to the floor of the inguinal canal, which appeared only somewhat weakened without a well-defined defect. A polypropylene mesh plug was inserted into the indirect hernia defect, and an appropriately fashioned mesh patch was sutured inferiorly to the shelving edge of the inguinal ligament, starting medially at the pubic tubercle, using interrupted Ethibond suture and superiorly interrupted Ethibond suture(s) to the conjoint tendon/transversalis muscle. Care was taken to assure the mesh was placed in a relaxed fashion without excessive tension and without any neurovascular structures incorporated into the repair. Laterally, the tails of the mesh were then crossed to recreate the internal ring. Hemostasis was confirmed, and the incision was closed in multiple layers using a running 2-0 Vicryl suture to re-approximate the external oblique aponeurosis, running 3-0 Vicryl suture to re-approximate Scarpa's fascia, interrupted 3-0 Vicryl deep dermal sutures, and running 4-0 Vicryl subcuticular suture to re-approximate epidermis.  The skin was then cleaned, dried, and sterile skin glue was applied. Patient was then safely able to be extubated, awaken, and transferred to PACU for post-operative monitoring and care.  I was present for all aspects of the above procedure, and there were no complications apparent.

## 2015-09-20 NOTE — Anesthesia Postprocedure Evaluation (Signed)
Anesthesia Post Note  Patient: Ronald [name]  Procedure(s) Performed: Procedure(s) (LRB): REPAIR RIGHT INGUINAL HERNIA  WITH MESH (Right)  Patient location during evaluation: PACU Level of consciousness: awake and alert and oriented Pain management: pain level controlled Vital Signs Assessment: post-procedure vital signs reviewed and stable Respiratory status: spontaneous breathing and patient connected to face mask oxygen Cardiovascular status: stable Postop Assessment: no signs of nausea or vomiting Anesthetic complications: no    Last Vitals:  Vitals:   09/20/15 0640 09/20/15 0930  BP: (!) 180/77   Pulse: (!) 57   Resp: (!) 21   Temp: 36.4 C 36.4 C    Last Pain:  Vitals:   09/20/15 0930  TempSrc:   PainSc: 5                  ADAMS, AMY A

## 2015-09-20 NOTE — Interval H&P Note (Deleted)
History and Physical Interval Note:  09/20/2015 7:26 AM  Ronald [name]  has presented today for surgery, with the diagnosis of inguinal hernia  The various methods of treatment have been discussed with the patient and family. After consideration of risks, benefits and other options for treatment, the patient has consented to  Procedure(s): REPAIR RIGHT INGUINAL HERNIA  WITH MESH (Right) as a surgical intervention .  The patient's history has been reviewed, patient examined, no change in status, stable for surgery.  I have reviewed the patient's chart and labs.  Questions were answered to the patient's satisfaction.     Vickie Epley

## 2015-09-20 NOTE — Interval H&P Note (Signed)
History and Physical Interval Note:  09/20/2015 7:26 AM  Ronald [name]  has presented today for surgery, with the diagnosis of inguinal hernia  The various methods of treatment have been discussed with the patient and family. After consideration of risks, benefits and other options for treatment, the patient has consented to  Procedure(s): REPAIR RIGHT INGUINAL HERNIA  WITH MESH (Right) as a surgical intervention .  The patient's history has been reviewed, patient examined, no change in status, stable for surgery.  I have reviewed the patient's chart and labs.  Questions were answered to the patient's satisfaction.     Vickie Epley

## 2015-09-20 NOTE — Anesthesia Preprocedure Evaluation (Signed)
Anesthesia Evaluation  Patient identified by MRN, date of birth, ID band Patient awake    Reviewed: Allergy & Precautions, NPO status , Patient's Chart, lab work & pertinent test results  Airway Mallampati: II  TM Distance: >3 FB     Dental  (+) Poor Dentition   Pulmonary Current Smoker (100 pk-yrs),    breath sounds clear to auscultation       Cardiovascular hypertension, + CAD, + Past MI, + CABG and + Peripheral Vascular Disease   Rhythm:Regular Rate:Bradycardia     Neuro/Psych CVA, No Residual Symptoms    GI/Hepatic GERD  Controlled and Medicated,  Endo/Other    Renal/GU      Musculoskeletal   Abdominal   Peds  Hematology   Anesthesia Other Findings   Reproductive/Obstetrics                             Anesthesia Physical Anesthesia Plan  ASA: III  Anesthesia Plan: General   Post-op Pain Management:    Induction: Intravenous  Airway Management Planned: LMA  Additional Equipment:   Intra-op Plan:   Post-operative Plan: Extubation in OR  Informed Consent: I have reviewed the patients History and Physical, chart, labs and discussed the procedure including the risks, benefits and alternatives for the proposed anesthesia with the patient or authorized representative who has indicated his/her understanding and acceptance.     Plan Discussed with:   Anesthesia Plan Comments:         Anesthesia Quick Evaluation

## 2015-09-20 NOTE — Anesthesia Procedure Notes (Signed)
Procedure Name: LMA Insertion Date/Time: 09/20/2015 7:44 AM Performed by: Andree Elk, AMY A Pre-anesthesia Checklist: Patient identified, Timeout performed, Emergency Drugs available, Suction available and Patient being monitored Patient Re-evaluated:Patient Re-evaluated prior to inductionOxygen Delivery Method: Circle system utilized Preoxygenation: Pre-oxygenation with 100% oxygen Intubation Type: IV induction Ventilation: Mask ventilation without difficulty LMA: LMA inserted LMA Size: 5.0 Number of attempts: 1 Placement Confirmation: positive ETCO2 and breath sounds checked- equal and bilateral Tube secured with: Tape Dental Injury: Teeth and Oropharynx as per pre-operative assessment

## 2015-09-25 ENCOUNTER — Encounter (HOSPITAL_COMMUNITY): Payer: Self-pay | Admitting: Surgery

## 2015-11-08 DIAGNOSIS — R972 Elevated prostate specific antigen [PSA]: Secondary | ICD-10-CM | POA: Diagnosis not present

## 2015-11-08 DIAGNOSIS — I10 Essential (primary) hypertension: Secondary | ICD-10-CM | POA: Diagnosis not present

## 2015-11-21 DIAGNOSIS — G4700 Insomnia, unspecified: Secondary | ICD-10-CM | POA: Diagnosis not present

## 2015-11-21 DIAGNOSIS — I10 Essential (primary) hypertension: Secondary | ICD-10-CM | POA: Diagnosis not present

## 2015-11-21 DIAGNOSIS — E785 Hyperlipidemia, unspecified: Secondary | ICD-10-CM | POA: Diagnosis not present

## 2015-11-21 DIAGNOSIS — N184 Chronic kidney disease, stage 4 (severe): Secondary | ICD-10-CM | POA: Diagnosis not present

## 2015-11-21 DIAGNOSIS — N400 Enlarged prostate without lower urinary tract symptoms: Secondary | ICD-10-CM | POA: Diagnosis not present

## 2015-11-21 DIAGNOSIS — Z0000 Encounter for general adult medical examination without abnormal findings: Secondary | ICD-10-CM | POA: Diagnosis not present

## 2015-12-03 DIAGNOSIS — R809 Proteinuria, unspecified: Secondary | ICD-10-CM | POA: Diagnosis not present

## 2015-12-03 DIAGNOSIS — I10 Essential (primary) hypertension: Secondary | ICD-10-CM | POA: Diagnosis not present

## 2015-12-03 DIAGNOSIS — D509 Iron deficiency anemia, unspecified: Secondary | ICD-10-CM | POA: Diagnosis not present

## 2015-12-03 DIAGNOSIS — E559 Vitamin D deficiency, unspecified: Secondary | ICD-10-CM | POA: Diagnosis not present

## 2015-12-03 DIAGNOSIS — N183 Chronic kidney disease, stage 3 (moderate): Secondary | ICD-10-CM | POA: Diagnosis not present

## 2015-12-03 DIAGNOSIS — Z79899 Other long term (current) drug therapy: Secondary | ICD-10-CM | POA: Diagnosis not present

## 2015-12-19 DIAGNOSIS — E870 Hyperosmolality and hypernatremia: Secondary | ICD-10-CM | POA: Diagnosis not present

## 2015-12-19 DIAGNOSIS — R809 Proteinuria, unspecified: Secondary | ICD-10-CM | POA: Diagnosis not present

## 2015-12-19 DIAGNOSIS — N184 Chronic kidney disease, stage 4 (severe): Secondary | ICD-10-CM | POA: Diagnosis not present

## 2015-12-19 DIAGNOSIS — E559 Vitamin D deficiency, unspecified: Secondary | ICD-10-CM | POA: Diagnosis not present

## 2016-02-19 DIAGNOSIS — H5203 Hypermetropia, bilateral: Secondary | ICD-10-CM | POA: Diagnosis not present

## 2016-02-19 DIAGNOSIS — H524 Presbyopia: Secondary | ICD-10-CM | POA: Diagnosis not present

## 2016-03-17 DIAGNOSIS — N183 Chronic kidney disease, stage 3 (moderate): Secondary | ICD-10-CM | POA: Diagnosis not present

## 2016-03-17 DIAGNOSIS — R809 Proteinuria, unspecified: Secondary | ICD-10-CM | POA: Diagnosis not present

## 2016-03-17 DIAGNOSIS — I10 Essential (primary) hypertension: Secondary | ICD-10-CM | POA: Diagnosis not present

## 2016-03-17 DIAGNOSIS — E559 Vitamin D deficiency, unspecified: Secondary | ICD-10-CM | POA: Diagnosis not present

## 2016-03-17 DIAGNOSIS — Z79899 Other long term (current) drug therapy: Secondary | ICD-10-CM | POA: Diagnosis not present

## 2016-03-17 DIAGNOSIS — D509 Iron deficiency anemia, unspecified: Secondary | ICD-10-CM | POA: Diagnosis not present

## 2016-03-19 DIAGNOSIS — R809 Proteinuria, unspecified: Secondary | ICD-10-CM | POA: Diagnosis not present

## 2016-03-19 DIAGNOSIS — I10 Essential (primary) hypertension: Secondary | ICD-10-CM | POA: Diagnosis not present

## 2016-03-19 DIAGNOSIS — N184 Chronic kidney disease, stage 4 (severe): Secondary | ICD-10-CM | POA: Diagnosis not present

## 2016-03-19 DIAGNOSIS — L559 Sunburn, unspecified: Secondary | ICD-10-CM | POA: Diagnosis not present

## 2016-04-07 DIAGNOSIS — I10 Essential (primary) hypertension: Secondary | ICD-10-CM | POA: Diagnosis not present

## 2016-04-07 DIAGNOSIS — G4700 Insomnia, unspecified: Secondary | ICD-10-CM | POA: Diagnosis not present

## 2016-04-07 DIAGNOSIS — N184 Chronic kidney disease, stage 4 (severe): Secondary | ICD-10-CM | POA: Diagnosis not present

## 2016-06-27 DIAGNOSIS — E785 Hyperlipidemia, unspecified: Secondary | ICD-10-CM | POA: Diagnosis not present

## 2016-07-01 DIAGNOSIS — I10 Essential (primary) hypertension: Secondary | ICD-10-CM | POA: Diagnosis not present

## 2016-07-01 DIAGNOSIS — R972 Elevated prostate specific antigen [PSA]: Secondary | ICD-10-CM | POA: Diagnosis not present

## 2016-07-01 DIAGNOSIS — D696 Thrombocytopenia, unspecified: Secondary | ICD-10-CM | POA: Diagnosis not present

## 2016-07-01 DIAGNOSIS — F5101 Primary insomnia: Secondary | ICD-10-CM | POA: Diagnosis not present

## 2016-07-01 DIAGNOSIS — N184 Chronic kidney disease, stage 4 (severe): Secondary | ICD-10-CM | POA: Diagnosis not present

## 2016-07-02 ENCOUNTER — Other Ambulatory Visit (HOSPITAL_COMMUNITY): Payer: Self-pay | Admitting: Nephrology

## 2016-07-02 DIAGNOSIS — N183 Chronic kidney disease, stage 3 unspecified: Secondary | ICD-10-CM

## 2016-07-02 DIAGNOSIS — N184 Chronic kidney disease, stage 4 (severe): Secondary | ICD-10-CM | POA: Diagnosis not present

## 2016-07-02 DIAGNOSIS — N179 Acute kidney failure, unspecified: Secondary | ICD-10-CM | POA: Diagnosis not present

## 2016-07-02 DIAGNOSIS — Z720 Tobacco use: Secondary | ICD-10-CM | POA: Diagnosis not present

## 2016-07-02 DIAGNOSIS — I10 Essential (primary) hypertension: Secondary | ICD-10-CM | POA: Diagnosis not present

## 2016-07-02 DIAGNOSIS — R809 Proteinuria, unspecified: Secondary | ICD-10-CM

## 2016-07-14 ENCOUNTER — Ambulatory Visit (HOSPITAL_COMMUNITY)
Admission: RE | Admit: 2016-07-14 | Discharge: 2016-07-14 | Disposition: A | Discharge status: Home / Self Care | Payer: Medicare Other | Source: Ambulatory Visit | Attending: Nephrology | Admitting: Nephrology

## 2016-07-14 DIAGNOSIS — N183 Chronic kidney disease, stage 3 unspecified: Secondary | ICD-10-CM

## 2016-07-14 DIAGNOSIS — R809 Proteinuria, unspecified: Secondary | ICD-10-CM | POA: Diagnosis not present

## 2016-07-14 DIAGNOSIS — N189 Chronic kidney disease, unspecified: Secondary | ICD-10-CM | POA: Insufficient documentation

## 2016-07-14 DIAGNOSIS — N281 Cyst of kidney, acquired: Secondary | ICD-10-CM | POA: Diagnosis not present

## 2016-07-14 DIAGNOSIS — I10 Essential (primary) hypertension: Secondary | ICD-10-CM | POA: Diagnosis not present

## 2016-07-14 DIAGNOSIS — E559 Vitamin D deficiency, unspecified: Secondary | ICD-10-CM | POA: Diagnosis not present

## 2016-07-14 DIAGNOSIS — Z79899 Other long term (current) drug therapy: Secondary | ICD-10-CM | POA: Diagnosis not present

## 2016-07-16 DIAGNOSIS — R809 Proteinuria, unspecified: Secondary | ICD-10-CM | POA: Diagnosis not present

## 2016-07-16 DIAGNOSIS — N179 Acute kidney failure, unspecified: Secondary | ICD-10-CM | POA: Diagnosis not present

## 2016-07-16 DIAGNOSIS — E559 Vitamin D deficiency, unspecified: Secondary | ICD-10-CM | POA: Diagnosis not present

## 2016-07-16 DIAGNOSIS — N184 Chronic kidney disease, stage 4 (severe): Secondary | ICD-10-CM | POA: Diagnosis not present

## 2016-07-28 NOTE — Progress Notes (Deleted)
Cardiology Office Note   Date:  07/28/2016   ID:  Ronald [name], DOB 03-24-1940, MRN [id number]  PCP:  Celene Squibb, MD  Cardiologist:   Jenkins Rouge, MD   No chief complaint on file.     History of Present Illness: Ronald [name] is a 76 y.o. male who presents for evaluation of CAD. Referred by Dr [place] Crane History Of CRF Cr around 3.2 Smoking and HTN.  There are vague notes in primary note regarding CAD with stroke During catheterization but PMH indicates stroke occurred trying to put stents in legs AAA repair in [place] 2012  CABG over 15 years ago Has yet to f/u with Dr Hinda Lenis renal Concern to make sure heart stable as patient May need dialysis in near future   Past Medical History:  Diagnosis Date  . GERD (gastroesophageal reflux disease)   . Hypertension   . Myocardial infarction    1999  . Stroke [facility])    no deficits; had stroke while trying to place "stents in legs".    Past Surgical History:  Procedure Laterality Date  . ABDOMINAL AORTIC ANEURYSM REPAIR  2012   IN [place]  . APPENDECTOMY    . CORONARY ARTERY BYPASS GRAFT     3 vessels 18 years ago  . INGUINAL HERNIA REPAIR Right 09/20/2015   Procedure: REPAIR RIGHT INGUINAL HERNIA  WITH MESH;  Surgeon: Vickie Epley, MD;  Location: [facility];  Service: General;  Laterality: Right;  . 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 amLODipine (NORVASC) 10 MG tablet Take 5 mg by mouth daily.    Marland Kitchen Bioflavonoid Products (ESTER C PO) Take 3,000 mg by mouth daily.    . carvedilol (COREG) 12.5 MG tablet Take 12.5 mg by mouth 2 (two) times daily with a meal.    . CHANTIX STARTING MONTH PAK 0.5 MG X 11 & 1 MG X 42 tablet Take 0.5 mg by mouth daily.    . clopidogrel (PLAVIX) 75 MG tablet Take 37.5 mg by mouth daily.    Marland Kitchen lisinopril (PRINIVIL,ZESTRIL) 10 MG tablet Take 10 mg by mouth daily.    . pantoprazole (PROTONIX) 40 MG tablet Take 40 mg by mouth  daily.    . rosuvastatin (CRESTOR) 20 MG tablet Take 10 mg by mouth daily.    . solifenacin (VESICARE) 5 MG tablet Take 5 mg by mouth daily.    . traMADol (ULTRAM) 50 MG tablet Take 50 mg by mouth every 6 (six) hours as needed for moderate pain.    . traMADol (ULTRAM) 50 MG tablet Take 1 tablet (50 mg total) by mouth every 6 (six) hours as needed for severe pain. 30 tablet 0  . Zn-Pyg Afri-Nettle-Saw Palmet (SAW PALMETTO COMPLEX PO) Take 1 tablet by mouth daily.    Marland Kitchen zolpidem (AMBIEN) 10 MG tablet Take 10 mg by mouth at bedtime.     No current facility-administered medications for this visit.     Allergies:   Patient has no known allergies.    Social History:  The patient  reports that he has been smoking Cigarettes.  He has a 100.00 pack-year smoking history. He has never used smokeless tobacco. He reports that he does not drink alcohol or use drugs.   Family History:  The patient's family history is not on file.    ROS:  Please see the history of present illness.   Otherwise,  review of systems are positive for none.   All other systems are reviewed and negative.    PHYSICAL EXAM: VS:  There were no vitals taken for this visit. , BMI There is no height or weight on file to calculate BMI. Affect appropriate Healthy:  appears stated age 81: normal Neck supple with no adenopathy JVP normal no bruits no thyromegaly Lungs clear with no wheezing and good diaphragmatic motion Heart:  S1/S2 no murmur, no rub, gallop or click PMI normal Abdomen: benighn, BS positve, no tenderness, no AAA no bruit.  No HSM or HJR Distal pulses intact with no bruits No edema Neuro non-focal Skin warm and dry No muscular weakness    EKG:  09/12/15 IVCD SR no acute ST changes    Recent Labs: 09/12/2015: ALT 17; BUN 30; Creatinine, Ser 2.11; Hemoglobin 14.4; Platelets 151; Potassium 4.0; Sodium 137    Lipid Panel No results found for: CHOL, TRIG, HDL, CHOLHDL, VLDL, LDLCALC, LDLDIRECT    Wt  Readings from Last 3 Encounters:  09/12/15 85.7 kg (189 lb)      Other studies Reviewed: Additional studies/ records that were reviewed today include: Notes Dr [place] Crane primary Epic Notes ECG .    ASSESSMENT AND PLAN:  1.  CAD/CABG:  Distant  2. CRF 3. HTN 4. Cholesterol 5. AAA 6. Smoking    Current medicines are reviewed at length with the patient today.  The patient does not have concerns regarding medicines.  The following changes have been made:  no change  Labs/ tests ordered today include: *** No orders of the defined types were placed in this encounter.    Disposition:   FU with ***     Signed, Jenkins Rouge, MD  07/28/2016 12:06 PM    [facility] [address], [place], [place]  [address] Phone: [phone]; Fax: [phone]

## 2016-07-29 ENCOUNTER — Ambulatory Visit: Payer: Medicare Other | Admitting: Cardiovascular Disease

## 2016-08-06 ENCOUNTER — Encounter: Payer: Self-pay | Admitting: *Deleted

## 2016-08-06 NOTE — Progress Notes (Signed)
Cardiology Office Note   Date:  08/07/2016   ID:  Ronald [name], DOB Dec 23, 1940, MRN [id number]  PCP:  Celene Squibb, MD  Cardiologist:   Jenkins Rouge, MD   No chief complaint on file.     History of Present Illness: Ronald [name] is a 76 y.o. male who presents for consultation regarding CAD. Referred by Dr [place] Crane [facility] includes CRF with baseline Cr around 3.2 sees [facility] at [facility]. ? 7 years ago had cath with aborted intervention because of CVA during procedure. Recently quit smoking using Chantix Review of over 20 pages of records from [facility]. AAA repair 6.4 cm aneurysm 01/29/12 Noted op report small type 2 endoleak at end of procedure Used a Gore excluder Device In regard to CAD minimal records. CABG 1999 Cath note 2009 notes LIMA patent SVG circumflex patent with native circumflex to RCA collaterals with total occlusion of native coronary arteries   He understands he will likely need dialysis. Very little urine output Cr did improve slightly off ACE  He is ordinally from [place] Lived in [place] before coming here Had a limousine surface for years and still likes cars Has a daughter in [place] and a son who is a Engineer, structural in [place]  Past Medical History:  Diagnosis Date  . GERD (gastroesophageal reflux disease)   . Hypertension   . Myocardial infarction ([place])    1999  . Stroke [facility])    no deficits; had stroke while trying to place "stents in legs".    Past Surgical History:  Procedure Laterality Date  . ABDOMINAL AORTIC ANEURYSM REPAIR  2012   IN [place]  . APPENDECTOMY    . CORONARY ARTERY BYPASS GRAFT     3 vessels 18 years ago  . INGUINAL HERNIA REPAIR Right 09/20/2015   Procedure: REPAIR RIGHT INGUINAL HERNIA  WITH MESH;  Surgeon: Vickie Epley, MD;  Location: [facility];  Service: General;  Laterality: Right;  . 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 amLODipine (NORVASC) 10  MG tablet Take 5 mg by mouth daily.    Marland Kitchen Bioflavonoid Products (ESTER C PO) Take 3,000 mg by mouth daily.    . carvedilol (COREG) 12.5 MG tablet Take 12.5 mg by mouth 2 (two) times daily with a meal.    . clopidogrel (PLAVIX) 75 MG tablet Take 37.5 mg by mouth daily.    . pantoprazole (PROTONIX) 40 MG tablet Take 40 mg by mouth daily.    . rosuvastatin (CRESTOR) 20 MG tablet Take 10 mg by mouth daily.    . solifenacin (VESICARE) 5 MG tablet Take 5 mg by mouth daily.    . traMADol (ULTRAM) 50 MG tablet Take 50 mg by mouth every 6 (six) hours as needed for moderate pain.    Marland Kitchen Zn-Pyg Afri-Nettle-Saw Palmet (SAW PALMETTO COMPLEX PO) Take 1 tablet by mouth daily.    Marland Kitchen zolpidem (AMBIEN) 10 MG tablet Take 10 mg by mouth at bedtime.     No current facility-administered medications for this visit.     Allergies:   Patient has no known allergies.    Social History:  The patient  reports that he quit smoking about 3 months ago. His smoking use included Cigarettes. He started smoking about 59 years ago. He has a 100.00 pack-year smoking history. He has never used smokeless tobacco. He reports that he does not drink alcohol or  use drugs.   Family History:  The patient's family history includes Cancer in his father.    ROS:  Please see the history of present illness.   Otherwise, review of systems are positive for none.   All other systems are reviewed and negative.    PHYSICAL EXAM: VS:  BP 140/62 (BP Location: Right Arm)   Pulse (!) 57   Ht 6\' 2"  (1.88 m)   Wt 86.2 kg (190 lb)   SpO2 97%   BMI 24.39 kg/m  , BMI Body mass index is 24.39 kg/m. Affect appropriate Healthy:  appears stated age 60: normal Neck supple with no adenopathy JVP normal no bruits no thyromegaly Lungs clear with no wheezing and good diaphragmatic motion Heart:  S1/S2 no murmur, no rub, gallop or click PMI normal Abdomen: benighn, BS positve, no tenderness, no AAA no bruit.  No HSM or HJR Distal pulses intact  with no bruits No edema Neuro non-focal Skin warm and dry No muscular weakness    EKG:   NSR LAD normal ST segments    Recent Labs: 09/12/2015: ALT 17; BUN 30; Creatinine, Ser 2.11; Hemoglobin 14.4; Platelets 151; Potassium 4.0; Sodium 137    Lipid Panel No results found for: CHOL, TRIG, HDL, CHOLHDL, VLDL, LDLCALC, LDLDIRECT    Wt Readings from Last 3 Encounters:  08/07/16 86.2 kg (190 lb)  09/12/15 85.7 kg (189 lb)      Other studies Reviewed: Additional studies/ records that were reviewed today include: Notes from Dr [place] Crane Primary notes [facility] AAA surgical report and cath report.    ASSESSMENT AND PLAN:  1.  CAD/CABG distant likely need for dialysis and surgery for fistula f/u exercise myvoue  2. AAA Endovascular Repair no palpable mass f/u [facility] for CT to follow endoleak/ residual AAA 3. CRF f/u Dr Hinda Lenis ACE stopped  4. HTN Well controlled.  Continue current medications and low sodium Dash type diet.   5. Smoking counseled on cessation  6. Cholesterol continue statin labs with primary    Current medicines are reviewed at length with the patient today.  The patient does not have concerns regarding medicines.  The following changes have been made:  no change  Labs/ tests ordered today include: Ex Myovue   Orders Placed This Encounter  Procedures  . NM Myocar Multi W/Spect W/Wall Motion / EF  . EKG 12-Lead     Disposition:   FU with cardiology in 6 months      Signed, Jenkins Rouge, MD  08/07/2016 3:36 PM    [facility] [address], [place], [place]  [address] Phone: [phone]; Fax: [phone]

## 2016-08-07 ENCOUNTER — Telehealth: Payer: Self-pay | Admitting: Cardiovascular Disease

## 2016-08-07 ENCOUNTER — Encounter: Payer: Self-pay | Admitting: Cardiovascular Disease

## 2016-08-07 ENCOUNTER — Ambulatory Visit (INDEPENDENT_AMBULATORY_CARE_PROVIDER_SITE_OTHER): Payer: Medicare Other | Admitting: Cardiovascular Disease

## 2016-08-07 VITALS — BP 140/62 | HR 57 | Ht 74.0 in | Wt 190.0 lb

## 2016-08-07 DIAGNOSIS — I2510 Atherosclerotic heart disease of native coronary artery without angina pectoris: Secondary | ICD-10-CM | POA: Diagnosis not present

## 2016-08-07 DIAGNOSIS — I10 Essential (primary) hypertension: Secondary | ICD-10-CM | POA: Diagnosis not present

## 2016-08-07 DIAGNOSIS — Z01818 Encounter for other preprocedural examination: Secondary | ICD-10-CM

## 2016-08-07 NOTE — Patient Instructions (Addendum)
Your physician wants you to follow-up in: 6 months with Dr Blima Singer will receive a reminder letter in the mail two months in advance. If you don't receive a letter, please call our office to schedule the follow-up appointment.   Your physician has requested that you have en exercise stress myoview. For further information please visit [email or web address]. Please follow instruction sheet, as given.HOLD THE COREG THE NIGHT BEFORE AND THE MORNING OF TEST    Your physician recommends that you continue on your current medications as directed. Please refer to the Current Medication list given to you today.     Thank you for choosing [facility] !

## 2016-08-07 NOTE — Telephone Encounter (Signed)
Pre-cert Verification for the following procedure   Exercise myoview   CP  Dr. Johnsie Cancel set for 08/12/16 at [facility]

## 2016-08-12 ENCOUNTER — Encounter (HOSPITAL_COMMUNITY)
Admission: RE | Admit: 2016-08-12 | Discharge: 2016-08-12 | Disposition: A | Discharge status: Home / Self Care | Payer: Medicare Other | Source: Ambulatory Visit | Attending: Cardiovascular Disease | Admitting: Cardiovascular Disease

## 2016-08-12 ENCOUNTER — Encounter (HOSPITAL_COMMUNITY): Payer: Self-pay

## 2016-08-12 ENCOUNTER — Encounter (HOSPITAL_BASED_OUTPATIENT_CLINIC_OR_DEPARTMENT_OTHER)
Admission: RE | Admit: 2016-08-12 | Discharge: 2016-08-12 | Disposition: A | Discharge status: Home / Self Care | Payer: Medicare Other | Source: Ambulatory Visit | Attending: Cardiovascular Disease | Admitting: Cardiovascular Disease

## 2016-08-12 DIAGNOSIS — Z01818 Encounter for other preprocedural examination: Secondary | ICD-10-CM | POA: Diagnosis not present

## 2016-08-12 DIAGNOSIS — I2510 Atherosclerotic heart disease of native coronary artery without angina pectoris: Secondary | ICD-10-CM | POA: Insufficient documentation

## 2016-08-12 HISTORY — DX: Disorder of kidney and ureter, unspecified: N28.9

## 2016-08-12 LAB — NM MYOCAR MULTI W/SPECT W/WALL MOTION / EF
CHL CUP NUCLEAR SDS: 0
CHL CUP NUCLEAR SRS: 2
CHL CUP NUCLEAR SSS: 2
CSEPED: 7 min
CSEPEDS: 56 s
CSEPEW: 10.1 METS
CSEPPHR: 118 {beats}/min
LV dias vol: 128 mL (ref 62–150)
LVSYSVOL: 56 mL
MPHR: 146 {beats}/min
NUC STRESS TID: 0.92
Percent HR: 80 %
RATE: 0.34
RPE: 17
Rest HR: 50 {beats}/min

## 2016-08-12 MED ORDER — TECHNETIUM TC 99M TETROFOSMIN IV KIT
30.0000 | PACK | Freq: Once | INTRAVENOUS | Status: AC | PRN
  Administered 2016-08-12: 28.9 via INTRAVENOUS

## 2016-08-12 MED ORDER — REGADENOSON 0.4 MG/5ML IV SOLN
INTRAVENOUS | Status: AC
  Administered 2016-08-12: 0.4 mg via INTRAVENOUS
  Filled 2016-08-12: qty 5

## 2016-08-12 MED ORDER — SODIUM CHLORIDE 0.9% FLUSH
INTRAVENOUS | Status: AC
  Administered 2016-08-12: 10 mL via INTRAVENOUS
  Filled 2016-08-12: qty 10

## 2016-08-12 MED ORDER — TECHNETIUM TC 99M TETROFOSMIN IV KIT
10.0000 | PACK | Freq: Once | INTRAVENOUS | Status: AC | PRN
  Administered 2016-08-12: 10.3 via INTRAVENOUS

## 2016-09-29 DIAGNOSIS — D509 Iron deficiency anemia, unspecified: Secondary | ICD-10-CM | POA: Diagnosis not present

## 2016-09-29 DIAGNOSIS — N183 Chronic kidney disease, stage 3 (moderate): Secondary | ICD-10-CM | POA: Diagnosis not present

## 2016-09-29 DIAGNOSIS — Z79899 Other long term (current) drug therapy: Secondary | ICD-10-CM | POA: Diagnosis not present

## 2016-09-29 DIAGNOSIS — I10 Essential (primary) hypertension: Secondary | ICD-10-CM | POA: Diagnosis not present

## 2016-09-29 DIAGNOSIS — E559 Vitamin D deficiency, unspecified: Secondary | ICD-10-CM | POA: Diagnosis not present

## 2016-09-29 DIAGNOSIS — R809 Proteinuria, unspecified: Secondary | ICD-10-CM | POA: Diagnosis not present

## 2016-10-01 DIAGNOSIS — N184 Chronic kidney disease, stage 4 (severe): Secondary | ICD-10-CM | POA: Diagnosis not present

## 2016-10-01 DIAGNOSIS — R809 Proteinuria, unspecified: Secondary | ICD-10-CM | POA: Diagnosis not present

## 2016-10-01 DIAGNOSIS — I10 Essential (primary) hypertension: Secondary | ICD-10-CM | POA: Diagnosis not present

## 2016-10-01 DIAGNOSIS — N250 Renal osteodystrophy: Secondary | ICD-10-CM | POA: Diagnosis not present

## 2017-01-05 DIAGNOSIS — I10 Essential (primary) hypertension: Secondary | ICD-10-CM | POA: Diagnosis not present

## 2017-01-05 DIAGNOSIS — E782 Mixed hyperlipidemia: Secondary | ICD-10-CM | POA: Diagnosis not present

## 2017-01-14 DIAGNOSIS — D696 Thrombocytopenia, unspecified: Secondary | ICD-10-CM | POA: Diagnosis not present

## 2017-01-14 DIAGNOSIS — N184 Chronic kidney disease, stage 4 (severe): Secondary | ICD-10-CM | POA: Diagnosis not present

## 2017-01-23 DIAGNOSIS — E870 Hyperosmolality and hypernatremia: Secondary | ICD-10-CM | POA: Diagnosis not present

## 2017-01-23 DIAGNOSIS — N184 Chronic kidney disease, stage 4 (severe): Secondary | ICD-10-CM | POA: Diagnosis not present

## 2017-01-23 DIAGNOSIS — I10 Essential (primary) hypertension: Secondary | ICD-10-CM | POA: Diagnosis not present

## 2017-01-23 DIAGNOSIS — R809 Proteinuria, unspecified: Secondary | ICD-10-CM | POA: Diagnosis not present

## 2017-04-28 DIAGNOSIS — E785 Hyperlipidemia, unspecified: Secondary | ICD-10-CM | POA: Diagnosis not present

## 2017-04-28 DIAGNOSIS — R972 Elevated prostate specific antigen [PSA]: Secondary | ICD-10-CM | POA: Diagnosis not present

## 2017-04-28 DIAGNOSIS — I10 Essential (primary) hypertension: Secondary | ICD-10-CM | POA: Diagnosis not present

## 2017-04-30 DIAGNOSIS — M545 Low back pain: Secondary | ICD-10-CM | POA: Diagnosis not present

## 2017-04-30 DIAGNOSIS — I10 Essential (primary) hypertension: Secondary | ICD-10-CM | POA: Diagnosis not present

## 2017-04-30 DIAGNOSIS — N184 Chronic kidney disease, stage 4 (severe): Secondary | ICD-10-CM | POA: Diagnosis not present

## 2017-04-30 DIAGNOSIS — F5101 Primary insomnia: Secondary | ICD-10-CM | POA: Diagnosis not present

## 2017-04-30 DIAGNOSIS — R972 Elevated prostate specific antigen [PSA]: Secondary | ICD-10-CM | POA: Diagnosis not present

## 2017-05-19 DIAGNOSIS — H40013 Open angle with borderline findings, low risk, bilateral: Secondary | ICD-10-CM | POA: Diagnosis not present

## 2017-05-19 DIAGNOSIS — H401130 Primary open-angle glaucoma, bilateral, stage unspecified: Secondary | ICD-10-CM | POA: Diagnosis not present

## 2017-05-22 DIAGNOSIS — H401132 Primary open-angle glaucoma, bilateral, moderate stage: Secondary | ICD-10-CM | POA: Diagnosis not present

## 2017-06-01 DIAGNOSIS — I10 Essential (primary) hypertension: Secondary | ICD-10-CM | POA: Diagnosis not present

## 2017-06-01 DIAGNOSIS — N183 Chronic kidney disease, stage 3 (moderate): Secondary | ICD-10-CM | POA: Diagnosis not present

## 2017-06-01 DIAGNOSIS — Z79899 Other long term (current) drug therapy: Secondary | ICD-10-CM | POA: Diagnosis not present

## 2017-06-01 DIAGNOSIS — D509 Iron deficiency anemia, unspecified: Secondary | ICD-10-CM | POA: Diagnosis not present

## 2017-06-01 DIAGNOSIS — E559 Vitamin D deficiency, unspecified: Secondary | ICD-10-CM | POA: Diagnosis not present

## 2017-06-03 DIAGNOSIS — N184 Chronic kidney disease, stage 4 (severe): Secondary | ICD-10-CM | POA: Diagnosis not present

## 2017-06-03 DIAGNOSIS — I10 Essential (primary) hypertension: Secondary | ICD-10-CM | POA: Diagnosis not present

## 2017-06-03 DIAGNOSIS — R809 Proteinuria, unspecified: Secondary | ICD-10-CM | POA: Diagnosis not present

## 2017-06-03 DIAGNOSIS — E559 Vitamin D deficiency, unspecified: Secondary | ICD-10-CM | POA: Diagnosis not present

## 2017-06-11 DIAGNOSIS — I10 Essential (primary) hypertension: Secondary | ICD-10-CM | POA: Diagnosis not present

## 2017-06-11 DIAGNOSIS — R972 Elevated prostate specific antigen [PSA]: Secondary | ICD-10-CM | POA: Diagnosis not present

## 2017-06-11 DIAGNOSIS — N184 Chronic kidney disease, stage 4 (severe): Secondary | ICD-10-CM | POA: Diagnosis not present

## 2017-06-11 DIAGNOSIS — M545 Low back pain: Secondary | ICD-10-CM | POA: Diagnosis not present

## 2017-06-11 DIAGNOSIS — K5900 Constipation, unspecified: Secondary | ICD-10-CM | POA: Diagnosis not present

## 2017-06-23 ENCOUNTER — Ambulatory Visit: Payer: Medicare Other | Admitting: Urology

## 2017-06-23 DIAGNOSIS — R972 Elevated prostate specific antigen [PSA]: Secondary | ICD-10-CM

## 2017-06-23 DIAGNOSIS — C61 Malignant neoplasm of prostate: Secondary | ICD-10-CM | POA: Diagnosis not present

## 2017-06-25 DIAGNOSIS — H2513 Age-related nuclear cataract, bilateral: Secondary | ICD-10-CM | POA: Diagnosis not present

## 2017-06-25 DIAGNOSIS — H401132 Primary open-angle glaucoma, bilateral, moderate stage: Secondary | ICD-10-CM | POA: Diagnosis not present

## 2017-07-14 ENCOUNTER — Encounter (HOSPITAL_COMMUNITY): Payer: Self-pay

## 2017-07-14 ENCOUNTER — Other Ambulatory Visit: Payer: Self-pay | Admitting: Urology

## 2017-07-14 ENCOUNTER — Ambulatory Visit (HOSPITAL_COMMUNITY)
Admission: RE | Admit: 2017-07-14 | Discharge: 2017-07-14 | Disposition: A | Discharge status: Home / Self Care | Payer: Medicare Other | Source: Ambulatory Visit | Attending: Urology | Admitting: Urology

## 2017-07-14 DIAGNOSIS — C61 Malignant neoplasm of prostate: Secondary | ICD-10-CM | POA: Diagnosis not present

## 2017-07-14 DIAGNOSIS — R972 Elevated prostate specific antigen [PSA]: Secondary | ICD-10-CM | POA: Diagnosis not present

## 2017-07-14 MED ORDER — GENTAMICIN SULFATE 40 MG/ML IJ SOLN
160.0000 mg | Freq: Once | INTRAMUSCULAR | Status: AC
  Administered 2017-07-14: 160 mg via INTRAMUSCULAR

## 2017-07-14 MED ORDER — LIDOCAINE HCL (PF) 2 % IJ SOLN
10.0000 mL | Freq: Once | INTRAMUSCULAR | Status: AC
  Administered 2017-07-14: 10 mL

## 2017-07-14 MED ORDER — GENTAMICIN SULFATE 40 MG/ML IJ SOLN
INTRAMUSCULAR | Status: AC
  Administered 2017-07-14: 160 mg via INTRAMUSCULAR
  Filled 2017-07-14: qty 4

## 2017-07-14 MED ORDER — LIDOCAINE HCL (PF) 2 % IJ SOLN
INTRAMUSCULAR | Status: AC
  Administered 2017-07-14: 10 mL
  Filled 2017-07-14: qty 10

## 2017-07-14 NOTE — Consent Form (Signed)
CLINICAL DATA:    Ultrasound was provided for use by the ordering physician, and a technical  charge was applied by the performing facility.  No radiologist  interpretation/professional services rendered.

## 2017-07-20 DIAGNOSIS — H2512 Age-related nuclear cataract, left eye: Secondary | ICD-10-CM | POA: Diagnosis not present

## 2017-07-20 DIAGNOSIS — Z01818 Encounter for other preprocedural examination: Secondary | ICD-10-CM | POA: Diagnosis not present

## 2017-07-20 DIAGNOSIS — H25812 Combined forms of age-related cataract, left eye: Secondary | ICD-10-CM | POA: Diagnosis not present

## 2017-07-20 DIAGNOSIS — H2511 Age-related nuclear cataract, right eye: Secondary | ICD-10-CM | POA: Diagnosis not present

## 2017-08-03 DIAGNOSIS — H35352 Cystoid macular degeneration, left eye: Secondary | ICD-10-CM | POA: Diagnosis not present

## 2017-08-03 DIAGNOSIS — H2511 Age-related nuclear cataract, right eye: Secondary | ICD-10-CM | POA: Diagnosis not present

## 2017-08-03 DIAGNOSIS — H25811 Combined forms of age-related cataract, right eye: Secondary | ICD-10-CM | POA: Diagnosis not present

## 2017-08-04 ENCOUNTER — Other Ambulatory Visit: Payer: Self-pay | Admitting: Urology

## 2017-08-04 DIAGNOSIS — C61 Malignant neoplasm of prostate: Secondary | ICD-10-CM

## 2017-08-07 ENCOUNTER — Encounter (HOSPITAL_COMMUNITY)
Admission: RE | Admit: 2017-08-07 | Discharge: 2017-08-07 | Disposition: A | Discharge status: Home / Self Care | Payer: Medicare Other | Source: Ambulatory Visit | Attending: Urology | Admitting: Urology

## 2017-08-07 DIAGNOSIS — C61 Malignant neoplasm of prostate: Secondary | ICD-10-CM | POA: Insufficient documentation

## 2017-08-07 MED ORDER — TECHNETIUM TC 99M MEDRONATE IV KIT
20.0000 | PACK | Freq: Once | INTRAVENOUS | Status: AC | PRN
  Administered 2017-08-07: 20 via INTRAVENOUS

## 2017-08-18 DIAGNOSIS — H35032 Hypertensive retinopathy, left eye: Secondary | ICD-10-CM | POA: Diagnosis not present

## 2017-08-18 DIAGNOSIS — H35352 Cystoid macular degeneration, left eye: Secondary | ICD-10-CM | POA: Diagnosis not present

## 2017-08-28 ENCOUNTER — Ambulatory Visit (HOSPITAL_COMMUNITY): Payer: Medicare HMO

## 2017-09-04 ENCOUNTER — Ambulatory Visit (HOSPITAL_COMMUNITY)
Admission: RE | Admit: 2017-09-04 | Discharge: 2017-09-04 | Disposition: A | Discharge status: Home / Self Care | Payer: Medicare HMO | Source: Ambulatory Visit | Attending: Urology | Admitting: Urology

## 2017-09-04 ENCOUNTER — Other Ambulatory Visit: Payer: Self-pay | Admitting: Urology

## 2017-09-04 DIAGNOSIS — N400 Enlarged prostate without lower urinary tract symptoms: Secondary | ICD-10-CM | POA: Insufficient documentation

## 2017-09-04 DIAGNOSIS — Z95828 Presence of other vascular implants and grafts: Secondary | ICD-10-CM | POA: Diagnosis not present

## 2017-09-04 DIAGNOSIS — I700 Atherosclerosis of aorta: Secondary | ICD-10-CM | POA: Insufficient documentation

## 2017-09-04 DIAGNOSIS — I714 Abdominal aortic aneurysm, without rupture: Secondary | ICD-10-CM | POA: Diagnosis not present

## 2017-09-04 DIAGNOSIS — C61 Malignant neoplasm of prostate: Secondary | ICD-10-CM | POA: Diagnosis not present

## 2017-09-04 DIAGNOSIS — K4090 Unilateral inguinal hernia, without obstruction or gangrene, not specified as recurrent: Secondary | ICD-10-CM | POA: Diagnosis not present

## 2017-09-04 LAB — POCT I-STAT CREATININE: Creatinine, Ser: 3.7 mg/dL — ABNORMAL HIGH (ref 0.61–1.24)

## 2017-09-04 MED ORDER — IOPAMIDOL (ISOVUE-300) INJECTION 61%: 100.0000 mL | Freq: Once | INTRAVENOUS | Status: DC | PRN

## 2017-10-05 DIAGNOSIS — R809 Proteinuria, unspecified: Secondary | ICD-10-CM | POA: Diagnosis not present

## 2017-10-05 DIAGNOSIS — N183 Chronic kidney disease, stage 3 (moderate): Secondary | ICD-10-CM | POA: Diagnosis not present

## 2017-10-05 DIAGNOSIS — E559 Vitamin D deficiency, unspecified: Secondary | ICD-10-CM | POA: Diagnosis not present

## 2017-10-05 DIAGNOSIS — Z79899 Other long term (current) drug therapy: Secondary | ICD-10-CM | POA: Diagnosis not present

## 2017-10-05 DIAGNOSIS — D509 Iron deficiency anemia, unspecified: Secondary | ICD-10-CM | POA: Diagnosis not present

## 2017-10-05 DIAGNOSIS — I10 Essential (primary) hypertension: Secondary | ICD-10-CM | POA: Diagnosis not present

## 2017-10-16 DIAGNOSIS — G4700 Insomnia, unspecified: Secondary | ICD-10-CM | POA: Diagnosis not present

## 2017-10-16 DIAGNOSIS — Z720 Tobacco use: Secondary | ICD-10-CM | POA: Diagnosis not present

## 2017-10-16 DIAGNOSIS — I10 Essential (primary) hypertension: Secondary | ICD-10-CM | POA: Diagnosis not present

## 2017-10-16 DIAGNOSIS — R972 Elevated prostate specific antigen [PSA]: Secondary | ICD-10-CM | POA: Diagnosis not present

## 2017-10-16 DIAGNOSIS — Z6825 Body mass index (BMI) 25.0-25.9, adult: Secondary | ICD-10-CM | POA: Diagnosis not present

## 2017-10-16 DIAGNOSIS — N184 Chronic kidney disease, stage 4 (severe): Secondary | ICD-10-CM | POA: Diagnosis not present

## 2017-10-27 ENCOUNTER — Institutional Professional Consult (permissible substitution) (INDEPENDENT_AMBULATORY_CARE_PROVIDER_SITE_OTHER): Payer: Medicare HMO | Admitting: Urology

## 2017-10-27 DIAGNOSIS — C61 Malignant neoplasm of prostate: Secondary | ICD-10-CM | POA: Diagnosis not present

## 2017-10-29 DIAGNOSIS — C61 Malignant neoplasm of prostate: Secondary | ICD-10-CM | POA: Diagnosis not present

## 2017-11-10 DIAGNOSIS — I2510 Atherosclerotic heart disease of native coronary artery without angina pectoris: Secondary | ICD-10-CM | POA: Diagnosis not present

## 2017-11-10 DIAGNOSIS — Z955 Presence of coronary angioplasty implant and graft: Secondary | ICD-10-CM | POA: Diagnosis not present

## 2017-11-10 DIAGNOSIS — R69 Illness, unspecified: Secondary | ICD-10-CM | POA: Diagnosis not present

## 2017-11-10 DIAGNOSIS — I252 Old myocardial infarction: Secondary | ICD-10-CM | POA: Diagnosis not present

## 2017-11-10 DIAGNOSIS — N289 Disorder of kidney and ureter, unspecified: Secondary | ICD-10-CM | POA: Diagnosis not present

## 2017-11-10 DIAGNOSIS — C61 Malignant neoplasm of prostate: Secondary | ICD-10-CM | POA: Insufficient documentation

## 2017-11-10 DIAGNOSIS — I509 Heart failure, unspecified: Secondary | ICD-10-CM | POA: Diagnosis not present

## 2017-11-24 ENCOUNTER — Other Ambulatory Visit: Payer: Self-pay | Admitting: Urology

## 2017-11-24 DIAGNOSIS — C61 Malignant neoplasm of prostate: Secondary | ICD-10-CM

## 2017-12-15 ENCOUNTER — Ambulatory Visit (HOSPITAL_COMMUNITY)
Admission: RE | Admit: 2017-12-15 | Discharge: 2017-12-15 | Disposition: A | Discharge status: Home / Self Care | Payer: Medicare HMO | Source: Ambulatory Visit | Attending: Urology | Admitting: Urology

## 2017-12-15 ENCOUNTER — Ambulatory Visit (INDEPENDENT_AMBULATORY_CARE_PROVIDER_SITE_OTHER): Payer: Medicare HMO | Admitting: Urology

## 2017-12-15 DIAGNOSIS — C61 Malignant neoplasm of prostate: Secondary | ICD-10-CM

## 2017-12-15 MED ORDER — GENTAMICIN SULFATE 40 MG/ML IJ SOLN
160.0000 mg | Freq: Once | INTRAMUSCULAR | Status: AC
  Administered 2017-12-15: 160 mg via INTRAMUSCULAR

## 2017-12-15 MED ORDER — LIDOCAINE HCL (PF) 2 % IJ SOLN
INTRAMUSCULAR | Status: AC
  Administered 2017-12-15: 10 mL
  Filled 2017-12-15: qty 10

## 2017-12-15 MED ORDER — GENTAMICIN SULFATE 40 MG/ML IJ SOLN
INTRAMUSCULAR | Status: AC
  Administered 2017-12-15: 160 mg via INTRAMUSCULAR
  Filled 2017-12-15: qty 4

## 2017-12-16 DIAGNOSIS — E785 Hyperlipidemia, unspecified: Secondary | ICD-10-CM | POA: Diagnosis not present

## 2017-12-16 DIAGNOSIS — N184 Chronic kidney disease, stage 4 (severe): Secondary | ICD-10-CM | POA: Diagnosis not present

## 2017-12-16 DIAGNOSIS — I10 Essential (primary) hypertension: Secondary | ICD-10-CM | POA: Diagnosis not present

## 2017-12-16 DIAGNOSIS — K219 Gastro-esophageal reflux disease without esophagitis: Secondary | ICD-10-CM | POA: Diagnosis not present

## 2018-01-11 DIAGNOSIS — E785 Hyperlipidemia, unspecified: Secondary | ICD-10-CM | POA: Diagnosis not present

## 2018-01-11 DIAGNOSIS — I10 Essential (primary) hypertension: Secondary | ICD-10-CM | POA: Diagnosis not present

## 2018-01-13 DIAGNOSIS — C61 Malignant neoplasm of prostate: Secondary | ICD-10-CM | POA: Diagnosis not present

## 2018-01-23 DIAGNOSIS — N400 Enlarged prostate without lower urinary tract symptoms: Secondary | ICD-10-CM | POA: Diagnosis not present

## 2018-01-23 DIAGNOSIS — E785 Hyperlipidemia, unspecified: Secondary | ICD-10-CM | POA: Diagnosis not present

## 2018-01-23 DIAGNOSIS — I10 Essential (primary) hypertension: Secondary | ICD-10-CM | POA: Diagnosis not present

## 2018-01-23 DIAGNOSIS — R972 Elevated prostate specific antigen [PSA]: Secondary | ICD-10-CM | POA: Diagnosis not present

## 2018-01-25 DIAGNOSIS — G4700 Insomnia, unspecified: Secondary | ICD-10-CM | POA: Diagnosis not present

## 2018-01-25 DIAGNOSIS — I10 Essential (primary) hypertension: Secondary | ICD-10-CM | POA: Diagnosis not present

## 2018-01-25 DIAGNOSIS — I2510 Atherosclerotic heart disease of native coronary artery without angina pectoris: Secondary | ICD-10-CM | POA: Diagnosis not present

## 2018-01-25 DIAGNOSIS — C61 Malignant neoplasm of prostate: Secondary | ICD-10-CM | POA: Diagnosis not present

## 2018-01-25 DIAGNOSIS — N400 Enlarged prostate without lower urinary tract symptoms: Secondary | ICD-10-CM | POA: Diagnosis not present

## 2018-01-25 DIAGNOSIS — R000 Tachycardia, unspecified: Secondary | ICD-10-CM | POA: Diagnosis not present

## 2018-01-25 DIAGNOSIS — Z955 Presence of coronary angioplasty implant and graft: Secondary | ICD-10-CM | POA: Diagnosis not present

## 2018-01-25 DIAGNOSIS — I252 Old myocardial infarction: Secondary | ICD-10-CM | POA: Diagnosis not present

## 2018-01-25 DIAGNOSIS — Z720 Tobacco use: Secondary | ICD-10-CM | POA: Diagnosis not present

## 2018-01-25 DIAGNOSIS — R69 Illness, unspecified: Secondary | ICD-10-CM | POA: Diagnosis not present

## 2018-01-25 DIAGNOSIS — I509 Heart failure, unspecified: Secondary | ICD-10-CM | POA: Diagnosis not present

## 2018-01-25 DIAGNOSIS — E782 Mixed hyperlipidemia: Secondary | ICD-10-CM | POA: Diagnosis not present

## 2018-01-25 DIAGNOSIS — M545 Low back pain: Secondary | ICD-10-CM | POA: Diagnosis not present

## 2018-01-25 DIAGNOSIS — N289 Disorder of kidney and ureter, unspecified: Secondary | ICD-10-CM | POA: Diagnosis not present

## 2018-01-25 DIAGNOSIS — N184 Chronic kidney disease, stage 4 (severe): Secondary | ICD-10-CM | POA: Diagnosis not present

## 2018-01-26 ENCOUNTER — Ambulatory Visit: Payer: Medicare HMO | Admitting: Urology

## 2018-01-26 DIAGNOSIS — C61 Malignant neoplasm of prostate: Secondary | ICD-10-CM | POA: Diagnosis not present

## 2018-02-08 DIAGNOSIS — C61 Malignant neoplasm of prostate: Secondary | ICD-10-CM | POA: Diagnosis not present

## 2018-02-09 DIAGNOSIS — N289 Disorder of kidney and ureter, unspecified: Secondary | ICD-10-CM | POA: Diagnosis not present

## 2018-02-09 DIAGNOSIS — I509 Heart failure, unspecified: Secondary | ICD-10-CM | POA: Diagnosis not present

## 2018-02-09 DIAGNOSIS — I2510 Atherosclerotic heart disease of native coronary artery without angina pectoris: Secondary | ICD-10-CM | POA: Diagnosis not present

## 2018-02-09 DIAGNOSIS — I252 Old myocardial infarction: Secondary | ICD-10-CM | POA: Diagnosis not present

## 2018-02-09 DIAGNOSIS — R69 Illness, unspecified: Secondary | ICD-10-CM | POA: Diagnosis not present

## 2018-02-09 DIAGNOSIS — Z955 Presence of coronary angioplasty implant and graft: Secondary | ICD-10-CM | POA: Diagnosis not present

## 2018-02-09 DIAGNOSIS — C61 Malignant neoplasm of prostate: Secondary | ICD-10-CM | POA: Diagnosis not present

## 2018-02-10 DIAGNOSIS — C61 Malignant neoplasm of prostate: Secondary | ICD-10-CM

## 2018-02-10 HISTORY — DX: Malignant neoplasm of prostate: C61

## 2018-02-12 DIAGNOSIS — Z510 Encounter for antineoplastic radiation therapy: Secondary | ICD-10-CM | POA: Diagnosis not present

## 2018-02-16 DIAGNOSIS — Z510 Encounter for antineoplastic radiation therapy: Secondary | ICD-10-CM | POA: Diagnosis not present

## 2018-02-17 DIAGNOSIS — Z510 Encounter for antineoplastic radiation therapy: Secondary | ICD-10-CM | POA: Diagnosis not present

## 2018-02-18 DIAGNOSIS — Z510 Encounter for antineoplastic radiation therapy: Secondary | ICD-10-CM | POA: Diagnosis not present

## 2018-02-19 DIAGNOSIS — Z510 Encounter for antineoplastic radiation therapy: Secondary | ICD-10-CM | POA: Diagnosis not present

## 2018-02-20 DIAGNOSIS — N183 Chronic kidney disease, stage 3 (moderate): Secondary | ICD-10-CM | POA: Diagnosis not present

## 2018-02-20 DIAGNOSIS — Z79899 Other long term (current) drug therapy: Secondary | ICD-10-CM | POA: Diagnosis not present

## 2018-02-20 DIAGNOSIS — I10 Essential (primary) hypertension: Secondary | ICD-10-CM | POA: Diagnosis not present

## 2018-02-20 DIAGNOSIS — D509 Iron deficiency anemia, unspecified: Secondary | ICD-10-CM | POA: Diagnosis not present

## 2018-02-20 DIAGNOSIS — R809 Proteinuria, unspecified: Secondary | ICD-10-CM | POA: Diagnosis not present

## 2018-02-20 DIAGNOSIS — E559 Vitamin D deficiency, unspecified: Secondary | ICD-10-CM | POA: Diagnosis not present

## 2018-02-22 DIAGNOSIS — Z510 Encounter for antineoplastic radiation therapy: Secondary | ICD-10-CM | POA: Diagnosis not present

## 2018-02-23 DIAGNOSIS — Z510 Encounter for antineoplastic radiation therapy: Secondary | ICD-10-CM | POA: Diagnosis not present

## 2018-02-24 DIAGNOSIS — N184 Chronic kidney disease, stage 4 (severe): Secondary | ICD-10-CM | POA: Diagnosis not present

## 2018-02-24 DIAGNOSIS — R809 Proteinuria, unspecified: Secondary | ICD-10-CM | POA: Diagnosis not present

## 2018-02-24 DIAGNOSIS — Z510 Encounter for antineoplastic radiation therapy: Secondary | ICD-10-CM | POA: Diagnosis not present

## 2018-02-24 DIAGNOSIS — E559 Vitamin D deficiency, unspecified: Secondary | ICD-10-CM | POA: Diagnosis not present

## 2018-02-25 DIAGNOSIS — Z510 Encounter for antineoplastic radiation therapy: Secondary | ICD-10-CM | POA: Diagnosis not present

## 2018-02-26 DIAGNOSIS — Z510 Encounter for antineoplastic radiation therapy: Secondary | ICD-10-CM | POA: Diagnosis not present

## 2018-03-02 DIAGNOSIS — Z510 Encounter for antineoplastic radiation therapy: Secondary | ICD-10-CM | POA: Diagnosis not present

## 2018-03-03 DIAGNOSIS — Z510 Encounter for antineoplastic radiation therapy: Secondary | ICD-10-CM | POA: Diagnosis not present

## 2018-03-04 DIAGNOSIS — Z510 Encounter for antineoplastic radiation therapy: Secondary | ICD-10-CM | POA: Diagnosis not present

## 2018-03-05 DIAGNOSIS — Z510 Encounter for antineoplastic radiation therapy: Secondary | ICD-10-CM | POA: Diagnosis not present

## 2018-03-08 DIAGNOSIS — Z510 Encounter for antineoplastic radiation therapy: Secondary | ICD-10-CM | POA: Diagnosis not present

## 2018-03-09 DIAGNOSIS — Z510 Encounter for antineoplastic radiation therapy: Secondary | ICD-10-CM | POA: Diagnosis not present

## 2018-03-10 DIAGNOSIS — Z510 Encounter for antineoplastic radiation therapy: Secondary | ICD-10-CM | POA: Diagnosis not present

## 2018-03-11 DIAGNOSIS — Z510 Encounter for antineoplastic radiation therapy: Secondary | ICD-10-CM | POA: Diagnosis not present

## 2018-03-12 DIAGNOSIS — Z510 Encounter for antineoplastic radiation therapy: Secondary | ICD-10-CM | POA: Diagnosis not present

## 2018-03-16 DIAGNOSIS — Z510 Encounter for antineoplastic radiation therapy: Secondary | ICD-10-CM | POA: Diagnosis not present

## 2018-03-17 DIAGNOSIS — Z510 Encounter for antineoplastic radiation therapy: Secondary | ICD-10-CM | POA: Diagnosis not present

## 2018-03-18 DIAGNOSIS — Z510 Encounter for antineoplastic radiation therapy: Secondary | ICD-10-CM | POA: Diagnosis not present

## 2018-03-19 DIAGNOSIS — Z510 Encounter for antineoplastic radiation therapy: Secondary | ICD-10-CM | POA: Diagnosis not present

## 2018-03-22 DIAGNOSIS — Z510 Encounter for antineoplastic radiation therapy: Secondary | ICD-10-CM | POA: Diagnosis not present

## 2018-03-23 DIAGNOSIS — Z510 Encounter for antineoplastic radiation therapy: Secondary | ICD-10-CM | POA: Diagnosis not present

## 2018-03-24 DIAGNOSIS — Z510 Encounter for antineoplastic radiation therapy: Secondary | ICD-10-CM | POA: Diagnosis not present

## 2018-03-25 ENCOUNTER — Ambulatory Visit (INDEPENDENT_AMBULATORY_CARE_PROVIDER_SITE_OTHER): Payer: Medicare HMO | Admitting: Internal Medicine

## 2018-03-25 DIAGNOSIS — Z510 Encounter for antineoplastic radiation therapy: Secondary | ICD-10-CM | POA: Diagnosis not present

## 2018-03-26 DIAGNOSIS — Z510 Encounter for antineoplastic radiation therapy: Secondary | ICD-10-CM | POA: Diagnosis not present

## 2018-03-29 ENCOUNTER — Ambulatory Visit (INDEPENDENT_AMBULATORY_CARE_PROVIDER_SITE_OTHER): Payer: Medicare Other | Admitting: Internal Medicine

## 2018-03-29 ENCOUNTER — Encounter (INDEPENDENT_AMBULATORY_CARE_PROVIDER_SITE_OTHER): Payer: Self-pay | Admitting: Internal Medicine

## 2018-03-29 ENCOUNTER — Ambulatory Visit (HOSPITAL_COMMUNITY)
Admission: RE | Admit: 2018-03-29 | Discharge: 2018-03-29 | Disposition: A | Discharge status: Home / Self Care | Payer: Medicare Other | Source: Ambulatory Visit | Attending: Internal Medicine | Admitting: Internal Medicine

## 2018-03-29 VITALS — BP 153/70 | HR 76 | Temp 97.7°F | Ht 73.0 in | Wt 192.0 lb

## 2018-03-29 DIAGNOSIS — K5900 Constipation, unspecified: Secondary | ICD-10-CM | POA: Insufficient documentation

## 2018-03-29 DIAGNOSIS — Z510 Encounter for antineoplastic radiation therapy: Secondary | ICD-10-CM | POA: Diagnosis not present

## 2018-03-29 NOTE — Patient Instructions (Signed)
Will get a KUB.

## 2018-03-29 NOTE — Progress Notes (Addendum)
Subjective:    Patient ID: Ronald [name], male    DOB: 03-11-1940, 78 y.o.   MRN: [id number]  HPI Referred by Dr. Hinda Lenis for constipation.   He is undergoing radiation 5 days a week in [place] for his prostate cancer. This is week 7 of 9.  He states he usually has 1-2 BM a week. His last BM was 2 weeks ago. He however did have diarrhea 1 week ago. He has a lot of gas and takes Gas X. He is drinking a lot of water.  His appetite is okay. No weight loss. He states he does not have the desire to have a BM.  Did use an enema the first 2 weeks of radiation with no results.  Eating 3 meals a day.  Hx of constipation.    Never undergone a colonoscopy in the past.    Hx of CKD stage 4. Hx of Prostate cancer, stage 3.  Followed by Dr. Diona Fanti.  Hx of CAD, CABG x 3 in [place] in 2000.     Review of Systems  Past Medical History:  Diagnosis Date  . GERD (gastroesophageal reflux disease)   . Hypertension   . Myocardial infarction ([place])    1999  . Renal insufficiency   . Stroke [facility])    no deficits; had stroke while trying to place "stents in legs".    Past Surgical History:  Procedure Laterality Date  . ABDOMINAL AORTIC ANEURYSM REPAIR  2012   IN [place]  . APPENDECTOMY    . CORONARY ARTERY BYPASS GRAFT     3 vessels 18 years ago  . INGUINAL HERNIA REPAIR Right 09/20/2015   Procedure: REPAIR RIGHT INGUINAL HERNIA  WITH MESH;  Surgeon: Vickie Epley, MD;  Location: [facility];  Service: General;  Laterality: Right;  . 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 amLODipine (NORVASC) 10 MG tablet Take 5 mg by mouth daily.    . carvedilol (COREG) 12.5 MG tablet Take 12.5 mg by mouth 2 (two) times daily with a meal.    . clopidogrel (PLAVIX) 75 MG tablet Take 37.5 mg by mouth daily.    . pantoprazole (PROTONIX) 40 MG tablet Take 40 mg by mouth daily.    . rosuvastatin  (CRESTOR) 20 MG tablet Take 10 mg by mouth daily.    . tamsulosin (FLOMAX) 0.4 MG CAPS capsule Take 0.4 mg by mouth.    . traMADol (ULTRAM) 50 MG tablet Take by mouth every 6 (six) hours as needed.    . zolpidem (AMBIEN) 10 MG tablet Take 10 mg by mouth at bedtime.     No current facility-administered medications on file prior to visit.         Objective:   Physical Exam Blood pressure (!) 153/70, pulse 76, temperature 97.7 F (36.5 C), height 6\' 1"  (1.854 m), weight 192 lb (87.1 kg). Alert and oriented. Skin warm and dry. Oral mucosa is moist.   . Sclera anicteric, conjunctivae is pink. Thyroid not enlarged. No cervical lymphadenopathy. Lungs clear. Heart regular rate and rhythm.  Abdomen is soft. Bowel sounds are positive. No hepatomegaly. No abdominal masses felt. No tenderness.  No edema to lower extremities.  Rectal exam. No masses. No stool in rectum.         Assessment & Plan:  Constipation. Am going to get a KUB. Further recommendations  to follow.

## 2018-03-30 DIAGNOSIS — Z510 Encounter for antineoplastic radiation therapy: Secondary | ICD-10-CM | POA: Diagnosis not present

## 2018-03-31 DIAGNOSIS — Z510 Encounter for antineoplastic radiation therapy: Secondary | ICD-10-CM | POA: Diagnosis not present

## 2018-04-01 DIAGNOSIS — Z510 Encounter for antineoplastic radiation therapy: Secondary | ICD-10-CM | POA: Diagnosis not present

## 2018-04-02 DIAGNOSIS — Z510 Encounter for antineoplastic radiation therapy: Secondary | ICD-10-CM | POA: Diagnosis not present

## 2018-04-05 DIAGNOSIS — Z510 Encounter for antineoplastic radiation therapy: Secondary | ICD-10-CM | POA: Diagnosis not present

## 2018-04-06 DIAGNOSIS — Z510 Encounter for antineoplastic radiation therapy: Secondary | ICD-10-CM | POA: Diagnosis not present

## 2018-04-07 DIAGNOSIS — Z510 Encounter for antineoplastic radiation therapy: Secondary | ICD-10-CM | POA: Diagnosis not present

## 2018-04-08 DIAGNOSIS — Z510 Encounter for antineoplastic radiation therapy: Secondary | ICD-10-CM | POA: Diagnosis not present

## 2018-04-09 DIAGNOSIS — Z510 Encounter for antineoplastic radiation therapy: Secondary | ICD-10-CM | POA: Diagnosis not present

## 2018-04-12 DIAGNOSIS — Z955 Presence of coronary angioplasty implant and graft: Secondary | ICD-10-CM | POA: Diagnosis not present

## 2018-04-12 DIAGNOSIS — I252 Old myocardial infarction: Secondary | ICD-10-CM | POA: Diagnosis not present

## 2018-04-12 DIAGNOSIS — N289 Disorder of kidney and ureter, unspecified: Secondary | ICD-10-CM | POA: Diagnosis not present

## 2018-04-12 DIAGNOSIS — I509 Heart failure, unspecified: Secondary | ICD-10-CM | POA: Diagnosis not present

## 2018-04-12 DIAGNOSIS — I2510 Atherosclerotic heart disease of native coronary artery without angina pectoris: Secondary | ICD-10-CM | POA: Diagnosis not present

## 2018-04-13 DIAGNOSIS — Z955 Presence of coronary angioplasty implant and graft: Secondary | ICD-10-CM | POA: Diagnosis not present

## 2018-04-13 DIAGNOSIS — I509 Heart failure, unspecified: Secondary | ICD-10-CM | POA: Diagnosis not present

## 2018-04-13 DIAGNOSIS — N289 Disorder of kidney and ureter, unspecified: Secondary | ICD-10-CM | POA: Diagnosis not present

## 2018-04-13 DIAGNOSIS — I252 Old myocardial infarction: Secondary | ICD-10-CM | POA: Diagnosis not present

## 2018-04-13 DIAGNOSIS — I2510 Atherosclerotic heart disease of native coronary artery without angina pectoris: Secondary | ICD-10-CM | POA: Diagnosis not present

## 2018-04-14 DIAGNOSIS — I509 Heart failure, unspecified: Secondary | ICD-10-CM | POA: Diagnosis not present

## 2018-04-14 DIAGNOSIS — I252 Old myocardial infarction: Secondary | ICD-10-CM | POA: Diagnosis not present

## 2018-04-14 DIAGNOSIS — N289 Disorder of kidney and ureter, unspecified: Secondary | ICD-10-CM | POA: Diagnosis not present

## 2018-04-14 DIAGNOSIS — Z955 Presence of coronary angioplasty implant and graft: Secondary | ICD-10-CM | POA: Diagnosis not present

## 2018-04-14 DIAGNOSIS — I2510 Atherosclerotic heart disease of native coronary artery without angina pectoris: Secondary | ICD-10-CM | POA: Diagnosis not present

## 2018-04-19 DIAGNOSIS — I509 Heart failure, unspecified: Secondary | ICD-10-CM | POA: Diagnosis not present

## 2018-04-19 DIAGNOSIS — Z955 Presence of coronary angioplasty implant and graft: Secondary | ICD-10-CM | POA: Diagnosis not present

## 2018-04-19 DIAGNOSIS — I2510 Atherosclerotic heart disease of native coronary artery without angina pectoris: Secondary | ICD-10-CM | POA: Diagnosis not present

## 2018-04-19 DIAGNOSIS — I252 Old myocardial infarction: Secondary | ICD-10-CM | POA: Diagnosis not present

## 2018-04-19 DIAGNOSIS — N289 Disorder of kidney and ureter, unspecified: Secondary | ICD-10-CM | POA: Diagnosis not present

## 2018-04-20 DIAGNOSIS — N289 Disorder of kidney and ureter, unspecified: Secondary | ICD-10-CM | POA: Diagnosis not present

## 2018-04-20 DIAGNOSIS — Z955 Presence of coronary angioplasty implant and graft: Secondary | ICD-10-CM | POA: Diagnosis not present

## 2018-04-20 DIAGNOSIS — I509 Heart failure, unspecified: Secondary | ICD-10-CM | POA: Diagnosis not present

## 2018-04-20 DIAGNOSIS — I252 Old myocardial infarction: Secondary | ICD-10-CM | POA: Diagnosis not present

## 2018-04-20 DIAGNOSIS — I2510 Atherosclerotic heart disease of native coronary artery without angina pectoris: Secondary | ICD-10-CM | POA: Diagnosis not present

## 2018-04-21 DIAGNOSIS — I509 Heart failure, unspecified: Secondary | ICD-10-CM | POA: Diagnosis not present

## 2018-04-21 DIAGNOSIS — N289 Disorder of kidney and ureter, unspecified: Secondary | ICD-10-CM | POA: Diagnosis not present

## 2018-04-21 DIAGNOSIS — Z955 Presence of coronary angioplasty implant and graft: Secondary | ICD-10-CM | POA: Diagnosis not present

## 2018-04-21 DIAGNOSIS — I252 Old myocardial infarction: Secondary | ICD-10-CM | POA: Diagnosis not present

## 2018-04-21 DIAGNOSIS — I2510 Atherosclerotic heart disease of native coronary artery without angina pectoris: Secondary | ICD-10-CM | POA: Diagnosis not present

## 2018-04-22 DIAGNOSIS — I2510 Atherosclerotic heart disease of native coronary artery without angina pectoris: Secondary | ICD-10-CM | POA: Diagnosis not present

## 2018-04-22 DIAGNOSIS — Z955 Presence of coronary angioplasty implant and graft: Secondary | ICD-10-CM | POA: Diagnosis not present

## 2018-04-22 DIAGNOSIS — N289 Disorder of kidney and ureter, unspecified: Secondary | ICD-10-CM | POA: Diagnosis not present

## 2018-04-22 DIAGNOSIS — I252 Old myocardial infarction: Secondary | ICD-10-CM | POA: Diagnosis not present

## 2018-04-22 DIAGNOSIS — I509 Heart failure, unspecified: Secondary | ICD-10-CM | POA: Diagnosis not present

## 2018-04-23 DIAGNOSIS — Z955 Presence of coronary angioplasty implant and graft: Secondary | ICD-10-CM | POA: Diagnosis not present

## 2018-04-23 DIAGNOSIS — I2510 Atherosclerotic heart disease of native coronary artery without angina pectoris: Secondary | ICD-10-CM | POA: Diagnosis not present

## 2018-04-23 DIAGNOSIS — I252 Old myocardial infarction: Secondary | ICD-10-CM | POA: Diagnosis not present

## 2018-04-23 DIAGNOSIS — N289 Disorder of kidney and ureter, unspecified: Secondary | ICD-10-CM | POA: Diagnosis not present

## 2018-04-23 DIAGNOSIS — I509 Heart failure, unspecified: Secondary | ICD-10-CM | POA: Diagnosis not present

## 2018-05-24 DIAGNOSIS — M545 Low back pain: Secondary | ICD-10-CM | POA: Diagnosis not present

## 2018-05-24 DIAGNOSIS — G894 Chronic pain syndrome: Secondary | ICD-10-CM | POA: Diagnosis not present

## 2018-05-24 DIAGNOSIS — K5900 Constipation, unspecified: Secondary | ICD-10-CM | POA: Diagnosis not present

## 2018-06-08 ENCOUNTER — Ambulatory Visit (INDEPENDENT_AMBULATORY_CARE_PROVIDER_SITE_OTHER): Payer: Medicare Other | Admitting: Urology

## 2018-06-08 DIAGNOSIS — C61 Malignant neoplasm of prostate: Secondary | ICD-10-CM | POA: Diagnosis not present

## 2018-06-08 DIAGNOSIS — Z79899 Other long term (current) drug therapy: Secondary | ICD-10-CM | POA: Diagnosis not present

## 2018-06-16 DIAGNOSIS — N184 Chronic kidney disease, stage 4 (severe): Secondary | ICD-10-CM | POA: Diagnosis not present

## 2018-06-16 DIAGNOSIS — I10 Essential (primary) hypertension: Secondary | ICD-10-CM | POA: Diagnosis not present

## 2018-06-16 DIAGNOSIS — E785 Hyperlipidemia, unspecified: Secondary | ICD-10-CM | POA: Diagnosis not present

## 2018-06-16 DIAGNOSIS — K219 Gastro-esophageal reflux disease without esophagitis: Secondary | ICD-10-CM | POA: Diagnosis not present

## 2018-06-17 DIAGNOSIS — Z0000 Encounter for general adult medical examination without abnormal findings: Secondary | ICD-10-CM | POA: Diagnosis not present

## 2018-08-25 DIAGNOSIS — I2510 Atherosclerotic heart disease of native coronary artery without angina pectoris: Secondary | ICD-10-CM | POA: Diagnosis not present

## 2018-08-25 DIAGNOSIS — I509 Heart failure, unspecified: Secondary | ICD-10-CM | POA: Diagnosis not present

## 2018-08-25 DIAGNOSIS — Z955 Presence of coronary angioplasty implant and graft: Secondary | ICD-10-CM | POA: Diagnosis not present

## 2018-08-25 DIAGNOSIS — N289 Disorder of kidney and ureter, unspecified: Secondary | ICD-10-CM | POA: Diagnosis not present

## 2018-08-25 DIAGNOSIS — I252 Old myocardial infarction: Secondary | ICD-10-CM | POA: Diagnosis not present

## 2018-10-12 ENCOUNTER — Ambulatory Visit (INDEPENDENT_AMBULATORY_CARE_PROVIDER_SITE_OTHER): Payer: Medicare Other | Admitting: Urology

## 2018-10-12 DIAGNOSIS — R3912 Poor urinary stream: Secondary | ICD-10-CM

## 2018-10-12 DIAGNOSIS — N401 Enlarged prostate with lower urinary tract symptoms: Secondary | ICD-10-CM | POA: Diagnosis not present

## 2018-10-12 DIAGNOSIS — C61 Malignant neoplasm of prostate: Secondary | ICD-10-CM | POA: Diagnosis not present

## 2018-10-31 DIAGNOSIS — N184 Chronic kidney disease, stage 4 (severe): Secondary | ICD-10-CM | POA: Diagnosis not present

## 2018-10-31 DIAGNOSIS — K219 Gastro-esophageal reflux disease without esophagitis: Secondary | ICD-10-CM | POA: Diagnosis not present

## 2018-10-31 DIAGNOSIS — I10 Essential (primary) hypertension: Secondary | ICD-10-CM | POA: Diagnosis not present

## 2018-10-31 DIAGNOSIS — E785 Hyperlipidemia, unspecified: Secondary | ICD-10-CM | POA: Diagnosis not present

## 2018-12-21 DIAGNOSIS — E785 Hyperlipidemia, unspecified: Secondary | ICD-10-CM | POA: Diagnosis not present

## 2018-12-21 DIAGNOSIS — N184 Chronic kidney disease, stage 4 (severe): Secondary | ICD-10-CM | POA: Diagnosis not present

## 2018-12-21 DIAGNOSIS — I10 Essential (primary) hypertension: Secondary | ICD-10-CM | POA: Diagnosis not present

## 2018-12-27 DIAGNOSIS — M545 Low back pain: Secondary | ICD-10-CM | POA: Diagnosis not present

## 2018-12-27 DIAGNOSIS — I10 Essential (primary) hypertension: Secondary | ICD-10-CM | POA: Diagnosis not present

## 2018-12-27 DIAGNOSIS — N184 Chronic kidney disease, stage 4 (severe): Secondary | ICD-10-CM | POA: Diagnosis not present

## 2018-12-27 DIAGNOSIS — E782 Mixed hyperlipidemia: Secondary | ICD-10-CM | POA: Diagnosis not present

## 2018-12-29 DIAGNOSIS — E785 Hyperlipidemia, unspecified: Secondary | ICD-10-CM | POA: Diagnosis not present

## 2018-12-29 DIAGNOSIS — N184 Chronic kidney disease, stage 4 (severe): Secondary | ICD-10-CM | POA: Diagnosis not present

## 2018-12-29 DIAGNOSIS — I10 Essential (primary) hypertension: Secondary | ICD-10-CM | POA: Diagnosis not present

## 2018-12-29 DIAGNOSIS — K219 Gastro-esophageal reflux disease without esophagitis: Secondary | ICD-10-CM | POA: Diagnosis not present

## 2019-01-26 DIAGNOSIS — E785 Hyperlipidemia, unspecified: Secondary | ICD-10-CM | POA: Diagnosis not present

## 2019-01-26 DIAGNOSIS — N184 Chronic kidney disease, stage 4 (severe): Secondary | ICD-10-CM | POA: Diagnosis not present

## 2019-01-26 DIAGNOSIS — K219 Gastro-esophageal reflux disease without esophagitis: Secondary | ICD-10-CM | POA: Diagnosis not present

## 2019-01-26 DIAGNOSIS — I129 Hypertensive chronic kidney disease with stage 1 through stage 4 chronic kidney disease, or unspecified chronic kidney disease: Secondary | ICD-10-CM | POA: Diagnosis not present

## 2019-02-01 ENCOUNTER — Encounter: Payer: Self-pay | Admitting: Urology

## 2019-03-01 ENCOUNTER — Telehealth: Payer: Self-pay | Admitting: Urology

## 2019-03-01 NOTE — Telephone Encounter (Signed)
Note made in error

## 2019-03-11 DIAGNOSIS — E785 Hyperlipidemia, unspecified: Secondary | ICD-10-CM | POA: Diagnosis not present

## 2019-03-11 DIAGNOSIS — K219 Gastro-esophageal reflux disease without esophagitis: Secondary | ICD-10-CM | POA: Diagnosis not present

## 2019-03-11 DIAGNOSIS — N184 Chronic kidney disease, stage 4 (severe): Secondary | ICD-10-CM | POA: Diagnosis not present

## 2019-03-11 DIAGNOSIS — I129 Hypertensive chronic kidney disease with stage 1 through stage 4 chronic kidney disease, or unspecified chronic kidney disease: Secondary | ICD-10-CM | POA: Diagnosis not present

## 2019-03-15 ENCOUNTER — Other Ambulatory Visit: Payer: Self-pay

## 2019-03-15 ENCOUNTER — Ambulatory Visit (INDEPENDENT_AMBULATORY_CARE_PROVIDER_SITE_OTHER): Payer: Medicare Other | Admitting: Urology

## 2019-03-15 VITALS — BP 128/78 | HR 65 | Temp 98.2°F | Ht 74.0 in | Wt 200.0 lb

## 2019-03-15 DIAGNOSIS — C61 Malignant neoplasm of prostate: Secondary | ICD-10-CM

## 2019-03-15 DIAGNOSIS — L299 Pruritus, unspecified: Secondary | ICD-10-CM

## 2019-03-15 LAB — POCT URINALYSIS DIPSTICK
Bilirubin, UA: NEGATIVE
Glucose, UA: NEGATIVE
Ketones, UA: NEGATIVE
Leukocytes, UA: NEGATIVE
Nitrite, UA: NEGATIVE
Protein, UA: POSITIVE — AB
Spec Grav, UA: 1.025 (ref 1.010–1.025)
Urobilinogen, UA: 0.2 E.U./dL
pH, UA: 5 (ref 5.0–8.0)

## 2019-03-15 MED ORDER — CIPROFLOXACIN HCL 500 MG PO TABS
500.0000 mg | ORAL_TABLET | Freq: Once | ORAL | Status: AC
  Administered 2019-03-15: 16:00:00 500 mg via ORAL

## 2019-03-15 NOTE — Progress Notes (Signed)
Urological Symptom Review  Patient is experiencing the following symptoms: Get up at night to urinate   Review of Systems  Gastrointestinal (upper)  : Negative for upper GI symptoms  Gastrointestinal (lower) : Negative for lower GI symptoms  Constitutional : Negative for symptoms  Skin: Itching  Eyes: Negative for eye symptoms  Ear/Nose/Throat : Negative for Ear/Nose/Throat symptoms  Hematologic/Lymphatic: Bruise/bleed easily  Cardiovascular : Negative for cardiovascular symptoms  Respiratory : Negative for respiratory symptoms  Endocrine: Negative for endocrine symptoms  Musculoskeletal: Negative for musculoskeletal symptoms  Neurological: Negative for neurological symptoms  Psychologic: Negative for psychiatric symptoms

## 2019-03-15 NOTE — Progress Notes (Signed)
theH&P  Chief Complaint: Prostate Cancer  History of Present Illness:   2.2.2021: Here today for follow-up. He has some difficulties reporting his interval hx as his memory has gotten significantly worse since last visit. This impairment to his memory is becoming very bothersome and has become a big worry for him. Additionally, he has been having very frequent hot flashes and sudden episodes of severe itching. He has been so far self-medicating w/ pain medication. Of note, his kidney fxn has apparently been very poor -- he especially c/o the build up of fluid on his legs even early in the day. He denies any blood per urine or stool. He also continues to c/o weak stream and other urinary sx's despite continuing on tamsulosin. He states that it would take him 15 minutes to even fill up a cup.   (below copied from [place] records):  Ronald [name] is a 79 year-old male established patient who is here evaluation for treatment of prostate cancer.  His prostate cancer was diagnosed 07/14/2017. H ae does have the pathology report from his biopsy. His most recent PSA is 21.3.   He has undergone External Beam Radiation Therapy for treatment. He has undergone Hormonal Therapy for treatment.   He does not have urinary incontinence. He has not recently had unwanted weight loss. He is not having pain in new locations.   ~ 20 years ago in [place] had what sounds like a TURP. He is not sure if PCa was diagnosed at that time, but no real treatments were needed in the few years he followed up after that. PSA in 2016 was around 8, recently 21.3 in March, 2019.  6.4.2019: TRUS/Bx --volume 61 ml. 11/12 cores positive;  7 with GS 4+3 pattern, 4 with GS 3+4 pattern.  CT (7.26)/bone scan(6.28) negative for metastatic disease.   IPSS 14  QOL score 3   11.5.2019: He was initiated on ADT with Firmagon 240 mg. Fiducial markers were placed in advance of EBRT by Dr Harlen Labs.   12.17.2019: Here for 4 mo Lupron. PSA 1.2. T  < 10.   4.28.2020: He completed EBRT by Dr Francesca Jewett  (Prostate + SV + Pelvic LNs IMRT 6 MV 180 cGy 25 4500 cGy 01.07.2020-  02.13.2020 Prostate + SV IMRT 6 MV 180 cGy 19 3420 cGy 02.14.2020-03.13.2020). PSA 0.1, T<10.   9.1.2020: Worsened LUTS. Only on 1 flomax/day. Does c/o low energy. No blood in urine or stool. Most recent PSA 0.1, testosterone level less than 10.   Previous Labs:    06/08/18 01/26/18 10/29/17 05/08/17 09/24/13  PSA  Total PSA 0.1 ng/dl 1.2 ng/dl 24.8 ng/dl 21.3 ng/dl 8.31 ng/dl     06/08/18 01/26/18  Hormones  Testosterone, Total < 10 pg/dL < 10 pg/dL    Past Medical History:  Diagnosis Date  . GERD (gastroesophageal reflux disease)   . Hypertension   . Myocardial infarction ([place])    1999  . Renal insufficiency   . Stroke [facility])    no deficits; had stroke while trying to place "stents in legs".    Past Surgical History:  Procedure Laterality Date  . ABDOMINAL AORTIC ANEURYSM REPAIR  2012   IN [place]  . APPENDECTOMY    . CORONARY ARTERY BYPASS GRAFT     3 vessels 18 years ago  . INGUINAL HERNIA REPAIR Right 09/20/2015   Procedure: REPAIR RIGHT INGUINAL HERNIA  WITH MESH;  Surgeon: Vickie Epley, MD;  Location: [facility];  Service: General;  Laterality: Right;  .  tumor removal from bladder      Home Medications:  Allergies as of 03/15/2019   No Known Allergies     Medication List       Accurate as of March 15, 2019  2:57 PM. If you have any questions, ask your nurse or doctor.        amLODipine 10 MG tablet Commonly known as: NORVASC Take 5 mg by mouth daily.   amLODipine 10 MG tablet Commonly known as: NORVASC Take by mouth.   carvedilol 12.5 MG tablet Commonly known as: COREG Take 12.5 mg by mouth 2 (two) times daily with a meal.   carvedilol 12.5 MG tablet Commonly known as: COREG Take by mouth.   Chantix Continuing Month Pak 1 MG tablet Generic drug: varenicline Take 1 mg by mouth 2 (two) times daily.   clopidogrel  75 MG tablet Commonly known as: PLAVIX Take 37.5 mg by mouth daily.   clopidogrel 75 MG tablet Commonly known as: PLAVIX Take by mouth.   latanoprost 0.005 % ophthalmic solution Commonly known as: XALATAN PLACE 1 DROP INTO BOTH EYES Q EVENING   pantoprazole 40 MG tablet Commonly known as: PROTONIX Take 40 mg by mouth daily.   pantoprazole 40 MG tablet Commonly known as: PROTONIX Take by mouth.   rosuvastatin 20 MG tablet Commonly known as: CRESTOR Take 10 mg by mouth daily.   rosuvastatin 20 MG tablet Commonly known as: CRESTOR Take by mouth.   Simbrinza 1-0.2 % Susp Generic drug: Brinzolamide-Brimonidine PLACE 1 DROP INTO BOTH EYES BID   solifenacin 5 MG tablet Commonly known as: VESICARE Take by mouth.   tamsulosin 0.4 MG Caps capsule Commonly known as: FLOMAX Take 0.4 mg by mouth.   tamsulosin 0.4 MG Caps capsule Commonly known as: FLOMAX Take by mouth.   traMADol 50 MG tablet Commonly known as: ULTRAM Take by mouth every 6 (six) hours as needed.   traMADol 50 MG tablet Commonly known as: ULTRAM Take by mouth.   zolpidem 10 MG tablet Commonly known as: AMBIEN Take 10 mg by mouth at bedtime.   zolpidem 10 MG tablet Commonly known as: AMBIEN TAKE 1 TABLET BY MOUTH IN THE EVENING       Allergies: No Known Allergies  Family History  Problem Relation Age of Onset  . Cancer Father     Social History:  reports that he quit smoking about 2 years ago. His smoking use included cigarettes. He started smoking about 61 years ago. He has a 100.00 pack-year smoking history. He has never used smokeless tobacco. He reports that he does not drink alcohol or use drugs.  ROS: A complete review of systems was performed.  All systems are negative except for pertinent findings as noted.  Physical Exam:  Vital signs in last 24 hours: BP 128/78   Pulse 65   Temp 98.2 F (36.8 C)   Ht 6\' 2"  (1.88 m)   Wt 200 lb (90.7 kg)   BMI 25.68 kg/m  Constitutional:   Alert and oriented, No acute distress Cardiovascular: Regular rate  Respiratory: Normal respiratory effort GI: Abdomen is soft, nontender, nondistended, no abdominal masses. No CVAT.  Genitourinary: Normal male phallus, testes are descended bilaterally and non-tender and without masses, scrotum is normal in appearance without lesions or masses, perineum is normal on inspection. Lymphatic: No lymphadenopathy Neurologic: Grossly intact, no focal deficits. Memory impairment.  Psychiatric: Normal mood and affect  Laboratory Data:  No results for input(s): WBC, HGB, HCT, PLT in the  last 72 hours.  No results for input(s): NA, K, CL, GLUCOSE, BUN, CALCIUM, CREATININE in the last 72 hours.  Invalid input(s): CO3   Results for orders placed or performed in visit on 03/15/19 (from the past 24 hour(s))  POCT urinalysis dipstick     Status: Abnormal   Collection Time: 03/15/19  2:40 PM  Result Value Ref Range   Color, UA yellow    Clarity, UA     Glucose, UA Negative Negative   Bilirubin, UA neg    Ketones, UA neg    Spec Grav, UA 1.025 1.010 - 1.025   Blood, UA +    pH, UA 5.0 5.0 - 8.0   Protein, UA Positive (A) Negative   Urobilinogen, UA 0.2 0.2 or 1.0 E.U./dL   Nitrite, UA neg    Leukocytes, UA Negative Negative   Appearance clear    Odor     No results found for this or any previous visit (from the past 240 hour(s)).  Renal Function: No results for input(s): CREATININE in the last 168 hours. CrCl cannot be calculated (Patient's most recent lab result is older than the maximum 21 days allowed.).  Radiologic Imaging: No results found.  Impression/Assessment:  He is due labs today. He seems to be struggling quite a bit of cognitive decline that could be due to being on ADT -- we will not administer a shot today, check labs, and consider this course of STADT complete if his labs seem okay today.   He is still having very bothersome urinary sx's. Cysto today shows prostatic  obstruction -- tamsulosin does not seem to be helping him all that much. Prostate 2 yrs ago was 61 mL. His best options at this point, outside of remaining on current medication, would be either to repeat a TURP or try him on a urolift.  Plan:  1. PSA and testosterone today -- will forward results.   2. If his labs look good today, we will not administer another 4 mo lupron and consider this course of STADT completed.   3. Return for OV in 6 mo's w/ labs prior.   4. Recommend that he consult w/ dermatologist about his episodes of severely "itchty" skin -- will set up consult for him.  5. Information given on TURP and urolift -- he will return in 6 wks to check-in with him and to discuss scheduling one of these if desired.

## 2019-03-19 LAB — TESTOSTERONE: Testosterone: <10 ng/dL — ABNORMAL LOW (ref 250–827)

## 2019-03-19 LAB — TEST AUTHORIZATION

## 2019-03-19 LAB — PSA: PSA: <0.1 ng/mL (ref <=4.0)

## 2019-03-21 ENCOUNTER — Encounter: Payer: Self-pay | Admitting: Urology

## 2019-03-22 ENCOUNTER — Telehealth: Payer: Self-pay

## 2019-03-22 NOTE — Telephone Encounter (Signed)
Wife notified of results and will relay message to pt.

## 2019-03-22 NOTE — Telephone Encounter (Signed)
-----   Message from Franchot Gallo, MD sent at 03/21/2019  8:51 AM EST -----  Notify patient that his PSA is 0

## 2019-03-24 DIAGNOSIS — E785 Hyperlipidemia, unspecified: Secondary | ICD-10-CM | POA: Diagnosis not present

## 2019-03-24 DIAGNOSIS — K219 Gastro-esophageal reflux disease without esophagitis: Secondary | ICD-10-CM | POA: Diagnosis not present

## 2019-03-24 DIAGNOSIS — I129 Hypertensive chronic kidney disease with stage 1 through stage 4 chronic kidney disease, or unspecified chronic kidney disease: Secondary | ICD-10-CM | POA: Diagnosis not present

## 2019-03-24 DIAGNOSIS — N184 Chronic kidney disease, stage 4 (severe): Secondary | ICD-10-CM | POA: Diagnosis not present

## 2019-03-28 DIAGNOSIS — I10 Essential (primary) hypertension: Secondary | ICD-10-CM | POA: Diagnosis not present

## 2019-03-28 DIAGNOSIS — K4020 Bilateral inguinal hernia, without obstruction or gangrene, not specified as recurrent: Secondary | ICD-10-CM | POA: Diagnosis not present

## 2019-03-28 DIAGNOSIS — N184 Chronic kidney disease, stage 4 (severe): Secondary | ICD-10-CM | POA: Diagnosis not present

## 2019-03-28 DIAGNOSIS — E785 Hyperlipidemia, unspecified: Secondary | ICD-10-CM | POA: Diagnosis not present

## 2019-03-28 DIAGNOSIS — E782 Mixed hyperlipidemia: Secondary | ICD-10-CM | POA: Diagnosis not present

## 2019-03-28 DIAGNOSIS — M545 Low back pain: Secondary | ICD-10-CM | POA: Diagnosis not present

## 2019-03-28 DIAGNOSIS — K5900 Constipation, unspecified: Secondary | ICD-10-CM | POA: Diagnosis not present

## 2019-04-29 DIAGNOSIS — D631 Anemia in chronic kidney disease: Secondary | ICD-10-CM | POA: Diagnosis not present

## 2019-04-29 DIAGNOSIS — R809 Proteinuria, unspecified: Secondary | ICD-10-CM | POA: Diagnosis not present

## 2019-04-29 DIAGNOSIS — Z79899 Other long term (current) drug therapy: Secondary | ICD-10-CM | POA: Diagnosis not present

## 2019-04-29 DIAGNOSIS — E559 Vitamin D deficiency, unspecified: Secondary | ICD-10-CM | POA: Diagnosis not present

## 2019-04-29 DIAGNOSIS — N184 Chronic kidney disease, stage 4 (severe): Secondary | ICD-10-CM | POA: Diagnosis not present

## 2019-05-04 DIAGNOSIS — I10 Essential (primary) hypertension: Secondary | ICD-10-CM | POA: Insufficient documentation

## 2019-05-04 DIAGNOSIS — N184 Chronic kidney disease, stage 4 (severe): Secondary | ICD-10-CM | POA: Insufficient documentation

## 2019-05-06 ENCOUNTER — Other Ambulatory Visit (HOSPITAL_COMMUNITY): Payer: Self-pay | Admitting: Nephrology

## 2019-05-06 DIAGNOSIS — E211 Secondary hyperparathyroidism, not elsewhere classified: Secondary | ICD-10-CM

## 2019-05-06 DIAGNOSIS — N184 Chronic kidney disease, stage 4 (severe): Secondary | ICD-10-CM | POA: Diagnosis not present

## 2019-05-06 DIAGNOSIS — R809 Proteinuria, unspecified: Secondary | ICD-10-CM | POA: Diagnosis not present

## 2019-05-06 DIAGNOSIS — E559 Vitamin D deficiency, unspecified: Secondary | ICD-10-CM | POA: Diagnosis not present

## 2019-05-06 DIAGNOSIS — D631 Anemia in chronic kidney disease: Secondary | ICD-10-CM | POA: Diagnosis not present

## 2019-05-11 ENCOUNTER — Ambulatory Visit (HOSPITAL_COMMUNITY)
Admission: RE | Admit: 2019-05-11 | Discharge: 2019-05-11 | Disposition: A | Discharge status: Home / Self Care | Payer: Medicare Other | Source: Ambulatory Visit | Attending: Nephrology | Admitting: Nephrology

## 2019-05-11 ENCOUNTER — Other Ambulatory Visit: Payer: Self-pay

## 2019-05-11 ENCOUNTER — Other Ambulatory Visit (HOSPITAL_COMMUNITY): Payer: Medicare Other

## 2019-05-11 DIAGNOSIS — I119 Hypertensive heart disease without heart failure: Secondary | ICD-10-CM | POA: Insufficient documentation

## 2019-05-11 DIAGNOSIS — Z8673 Personal history of transient ischemic attack (TIA), and cerebral infarction without residual deficits: Secondary | ICD-10-CM | POA: Diagnosis not present

## 2019-05-11 DIAGNOSIS — E211 Secondary hyperparathyroidism, not elsewhere classified: Secondary | ICD-10-CM | POA: Insufficient documentation

## 2019-05-11 DIAGNOSIS — N289 Disorder of kidney and ureter, unspecified: Secondary | ICD-10-CM | POA: Diagnosis not present

## 2019-05-11 DIAGNOSIS — I351 Nonrheumatic aortic (valve) insufficiency: Secondary | ICD-10-CM | POA: Diagnosis not present

## 2019-05-11 DIAGNOSIS — K219 Gastro-esophageal reflux disease without esophagitis: Secondary | ICD-10-CM | POA: Diagnosis not present

## 2019-05-11 DIAGNOSIS — I252 Old myocardial infarction: Secondary | ICD-10-CM | POA: Insufficient documentation

## 2019-05-11 NOTE — Progress Notes (Signed)
*  PRELIMINARY RESULTS* Echocardiogram 2D Echocardiogram has been performed.  Ronald [name] 05/11/2019, 9:50 AM

## 2019-05-17 ENCOUNTER — Other Ambulatory Visit: Payer: Self-pay

## 2019-05-17 ENCOUNTER — Encounter: Payer: Self-pay | Admitting: Urology

## 2019-05-17 ENCOUNTER — Ambulatory Visit (INDEPENDENT_AMBULATORY_CARE_PROVIDER_SITE_OTHER): Payer: Medicare Other | Admitting: Urology

## 2019-05-17 VITALS — BP 152/73 | HR 68 | Temp 96.4°F | Ht 74.0 in | Wt 203.8 lb

## 2019-05-17 DIAGNOSIS — C61 Malignant neoplasm of prostate: Secondary | ICD-10-CM | POA: Diagnosis not present

## 2019-05-17 LAB — POCT URINALYSIS DIPSTICK
Bilirubin, UA: NEGATIVE
Blood, UA: NEGATIVE
Glucose, UA: NEGATIVE
Ketones, UA: NEGATIVE
Leukocytes, UA: NEGATIVE
Nitrite, UA: NEGATIVE
Protein, UA: POSITIVE — AB
Spec Grav, UA: 1.01 (ref 1.010–1.025)
Urobilinogen, UA: 0.2 E.U./dL
pH, UA: 5 (ref 5.0–8.0)

## 2019-05-17 NOTE — Progress Notes (Signed)

## 2019-05-17 NOTE — Progress Notes (Signed)
H&P  Chief Complaint: Prostate Cancer  History of Present Illness:   4.6.2021: Here today for follow-up. Most recent PSA undetectable with T levels remaining castrate. He remains on tamsulosin 1x daily. He reports today that he is no longer having issues with urination and is no longer considering surgical management.   IPSS Questionnaire (AUA-7): Over the past month.   1)  How often have you had a sensation of not emptying your bladder completely after you finish urinating?  0 - Not at all  2)  How often have you had to urinate again less than two hours after you finished urinating? 0 - Not at all  3)  How often have you found you stopped and started again several times when you urinated?  2 - Less than half the time  4) How difficult have you found it to postpone urination?  0 - Not at all  5) How often have you had a weak urinary stream?  1 - Less than 1 time in 5  6) How often have you had to push or strain to begin urination?  0 - Not at all  7) How many times did you most typically get up to urinate from the time you went to bed until the time you got up in the morning?  3 - 3 times  Total score:  0-7 mildly symptomatic   8-19 moderately symptomatic   20-35 severely symptomatic   Total: 6 QoL: 1  Past Medical History:  Diagnosis Date  . GERD (gastroesophageal reflux disease)   . Hypertension   . Myocardial infarction ([place])    1999  . Renal insufficiency   . Stroke [facility])    no deficits; had stroke while trying to place "stents in legs".    Past Surgical History:  Procedure Laterality Date  . ABDOMINAL AORTIC ANEURYSM REPAIR  2012   IN [place]  . APPENDECTOMY    . CORONARY ARTERY BYPASS GRAFT     3 vessels 18 years ago  . INGUINAL HERNIA REPAIR Right 09/20/2015   Procedure: REPAIR RIGHT INGUINAL HERNIA  WITH MESH;  Surgeon: Vickie Epley, MD;  Location: [facility];  Service: General;  Laterality: Right;  . tumor removal from bladder      Home Medications:    Allergies as of 05/17/2019   No Known Allergies     Medication List       Accurate as of May 17, 2019  9:25 AM. If you have any questions, ask your nurse or doctor.        amLODipine 10 MG tablet Commonly known as: NORVASC Take 5 mg by mouth daily. What changed: Another medication with the same name was removed. Continue taking this medication, and follow the directions you see here. Changed by: Jorja Loa, MD   carvedilol 12.5 MG tablet Commonly known as: COREG Take 12.5 mg by mouth 2 (two) times daily with a meal. What changed: Another medication with the same name was removed. Continue taking this medication, and follow the directions you see here. Changed by: Jorja Loa, MD   Chantix Continuing Month Pak 1 MG tablet Generic drug: varenicline Take 1 mg by mouth 2 (two) times daily. What changed: Another medication with the same name was removed. Continue taking this medication, and follow the directions you see here. Changed by: Jorja Loa, MD   clopidogrel 75 MG tablet Commonly known as: PLAVIX Take 37.5 mg by mouth daily.   clopidogrel 75 MG  tablet Commonly known as: PLAVIX Take by mouth.   ergocalciferol 1.25 MG (50000 UT) capsule Commonly known as: VITAMIN D2 Take by mouth.   Vitamin D (Ergocalciferol) 1.25 MG (50000 UNIT) Caps capsule Commonly known as: DRISDOL Take 50,000 Units by mouth once a week.   furosemide 40 MG tablet Commonly known as: LASIX Take 40 mg by mouth 2 (two) times daily.   latanoprost 0.005 % ophthalmic solution Commonly known as: XALATAN PLACE 1 DROP INTO BOTH EYES Q EVENING   pantoprazole 40 MG tablet Commonly known as: PROTONIX Take 40 mg by mouth daily.   pantoprazole 40 MG tablet Commonly known as: PROTONIX Take by mouth.   rosuvastatin 20 MG tablet Commonly known as: CRESTOR Take 10 mg by mouth daily. What changed: Another medication with the same name was removed. Continue taking this  medication, and follow the directions you see here. Changed by: Jorja Loa, MD   Simbrinza 1-0.2 % Susp Generic drug: Brinzolamide-Brimonidine PLACE 1 DROP INTO BOTH EYES BID   solifenacin 5 MG tablet Commonly known as: VESICARE Take by mouth.   tamsulosin 0.4 MG Caps capsule Commonly known as: FLOMAX Take 0.4 mg by mouth. What changed: Another medication with the same name was removed. Continue taking this medication, and follow the directions you see here. Changed by: Jorja Loa, MD   traMADol 50 MG tablet Commonly known as: ULTRAM Take by mouth every 6 (six) hours as needed. What changed: Another medication with the same name was removed. Continue taking this medication, and follow the directions you see here. Changed by: Jorja Loa, MD   zolpidem 10 MG tablet Commonly known as: AMBIEN Take 10 mg by mouth at bedtime. What changed: Another medication with the same name was removed. Continue taking this medication, and follow the directions you see here. Changed by: Jorja Loa, MD       Allergies: No Known Allergies  Family History  Problem Relation Age of Onset  . Cancer Father     Social History:  reports that he quit smoking about 3 years ago. His smoking use included cigarettes. He started smoking about 61 years ago. He has a 100.00 pack-year smoking history. He has never used smokeless tobacco. He reports that he does not drink alcohol or use drugs.  ROS: A complete review of systems was performed.  All systems are negative except for pertinent findings as noted.  Physical Exam:  Vital signs in last 24 hours: BP (!) 152/73   Pulse 68   Temp (!) 96.4 F (35.8 C)   Ht 6\' 2"  (1.88 m)   Wt 203 lb 12.8 oz (92.4 kg)   BMI 26.17 kg/m  Constitutional:  Alert and oriented, No acute distress Cardiovascular: Regular rate  Respiratory: Normal respiratory effort Genitourinary: Not completed Neurologic: Grossly intact, no focal  deficits Psychiatric: Normal mood and affect  Laboratory Data:  No results for input(s): WBC, HGB, HCT, PLT in the last 72 hours.  No results for input(s): NA, K, CL, GLUCOSE, BUN, CALCIUM, CREATININE in the last 72 hours.  Invalid input(s): CO3   Results for orders placed or performed in visit on 05/17/19 (from the past 24 hour(s))  POCT urinalysis dipstick     Status: Abnormal   Collection Time: 05/17/19  9:17 AM  Result Value Ref Range   Color, UA yellow    Clarity, UA     Glucose, UA Negative Negative   Bilirubin, UA neg    Ketones, UA neg  Spec Grav, UA 1.010 1.010 - 1.025   Blood, UA neg    pH, UA 5.0 5.0 - 8.0   Protein, UA Positive (A) Negative   Urobilinogen, UA 0.2 0.2 or 1.0 E.U./dL   Nitrite, UA neg    Leukocytes, UA Negative Negative   Appearance clear    Odor     No results found for this or any previous visit (from the past 240 hour(s)).  I have reviewed prior pt notes  I have reviewed notes from referring/previous physicians  I have reviewed urinalysis results  I have reviewed prior PSA results  Impression/Assessment:  He is no longer having any difficulties with urination, which is quite a significant change from last visit. I do not think he requires any further management of this at present.   His PSA and testosterone have also remained stable and low -- we will continue to see him on a q 4 mo basis and gradually space this out pending response.  Plan:  1. Return in 4 mo for OV w/ labs prior (keep scheduled appointment).  2. We discussed options for management of his urinary sx's -- he is no longer interested in surgical management.  3. Continue on tamsulosin.

## 2019-05-19 DIAGNOSIS — E559 Vitamin D deficiency, unspecified: Secondary | ICD-10-CM | POA: Diagnosis not present

## 2019-05-19 DIAGNOSIS — N189 Chronic kidney disease, unspecified: Secondary | ICD-10-CM | POA: Diagnosis not present

## 2019-05-19 DIAGNOSIS — N184 Chronic kidney disease, stage 4 (severe): Secondary | ICD-10-CM | POA: Diagnosis not present

## 2019-05-19 DIAGNOSIS — E211 Secondary hyperparathyroidism, not elsewhere classified: Secondary | ICD-10-CM | POA: Diagnosis not present

## 2019-05-19 DIAGNOSIS — R809 Proteinuria, unspecified: Secondary | ICD-10-CM | POA: Diagnosis not present

## 2019-05-20 DIAGNOSIS — N170 Acute kidney failure with tubular necrosis: Secondary | ICD-10-CM | POA: Diagnosis not present

## 2019-05-20 DIAGNOSIS — N184 Chronic kidney disease, stage 4 (severe): Secondary | ICD-10-CM | POA: Diagnosis not present

## 2019-05-20 DIAGNOSIS — I5032 Chronic diastolic (congestive) heart failure: Secondary | ICD-10-CM | POA: Diagnosis not present

## 2019-05-20 DIAGNOSIS — D631 Anemia in chronic kidney disease: Secondary | ICD-10-CM | POA: Diagnosis not present

## 2019-05-20 DIAGNOSIS — R001 Bradycardia, unspecified: Secondary | ICD-10-CM | POA: Diagnosis not present

## 2019-05-27 ENCOUNTER — Other Ambulatory Visit: Payer: Self-pay

## 2019-05-27 DIAGNOSIS — C61 Malignant neoplasm of prostate: Secondary | ICD-10-CM

## 2019-05-27 DIAGNOSIS — R001 Bradycardia, unspecified: Secondary | ICD-10-CM | POA: Diagnosis not present

## 2019-05-27 DIAGNOSIS — N170 Acute kidney failure with tubular necrosis: Secondary | ICD-10-CM | POA: Diagnosis not present

## 2019-05-27 DIAGNOSIS — N189 Chronic kidney disease, unspecified: Secondary | ICD-10-CM | POA: Diagnosis not present

## 2019-05-27 DIAGNOSIS — N184 Chronic kidney disease, stage 4 (severe): Secondary | ICD-10-CM | POA: Diagnosis not present

## 2019-05-27 DIAGNOSIS — I5032 Chronic diastolic (congestive) heart failure: Secondary | ICD-10-CM | POA: Diagnosis not present

## 2019-05-27 MED ORDER — TAMSULOSIN HCL 0.4 MG PO CAPS: 0.4000 mg | ORAL_CAPSULE | Freq: Every day | ORAL | 3 refills | Status: DC

## 2019-05-27 MED ORDER — TAMSULOSIN HCL 0.4 MG PO CAPS: 0.4000 mg | ORAL_CAPSULE | Freq: Every day | ORAL | 11 refills | Status: DC

## 2019-06-09 DIAGNOSIS — D631 Anemia in chronic kidney disease: Secondary | ICD-10-CM | POA: Diagnosis not present

## 2019-06-09 DIAGNOSIS — I5032 Chronic diastolic (congestive) heart failure: Secondary | ICD-10-CM | POA: Diagnosis not present

## 2019-06-09 DIAGNOSIS — N185 Chronic kidney disease, stage 5: Secondary | ICD-10-CM | POA: Diagnosis not present

## 2019-06-09 DIAGNOSIS — N170 Acute kidney failure with tubular necrosis: Secondary | ICD-10-CM | POA: Diagnosis not present

## 2019-06-09 DIAGNOSIS — E211 Secondary hyperparathyroidism, not elsewhere classified: Secondary | ICD-10-CM | POA: Diagnosis not present

## 2019-06-13 NOTE — Progress Notes (Addendum)
Cardiology Office Note  Date: 06/14/2019   ID: Ronald [name], DOB 08-23-1940, MRN [id number]  PCP:  Celene Squibb, MD  Cardiologist:  Jenkins Rouge, MD Electrophysiologist:  None   Chief Complaint: F/U CAD (status post CABG 1999), CVA, S/P AAA with repair 6.4 cm aneurysm 01/29/2012, PAD  History of Present Illness: Ronald [name] is a 79 y.o. male with a history of CAD (status post CABG 1999.  Cardiac catheterization 2009 noted LIMA patent, SVG to circumflex patent with native circumflex to RCA collaterals total occlusion of native coronary arteries).  Near ESRD and was seeing Dr. Lowanda Foster previously but now sees Dr. Theador Hawthorne.  Hemodialysis was anticipated in the near future.  100-pack-year smoking history. Hx of PAD. S/P CVA after attempted peripheral vascular catheterization / PCI.  Last seen by Dr. Johnsie Cancel 08/07/2016.  Recommendation was to have follow-up exercise Myoview.  He was to follow-up with vein and vascular surgery per CT to follow endoleak 2/2 abdominal aortic aneurysm repair.  Blood pressure was well controlled.  Patient was counseled on smoking cessation.  He was continuing statin medication and follow-up labs with primary care provider.  Patient has end-stage renal disease and has a pending appointment for vascular surgery consult for placement of AV fistula to start hemodialysis.  Patient has not been seen in quite some time since June 2018.  He denies any particular issues.  States he is not very active at all.  He does have some bilateral lower extremity edema.  He takes maximum dose of amlodipine.  This may be contributing to the edema.  He denies any anginal or exertional symptoms but is very inactive.  Denies any orthostatic symptoms, presyncopal or syncopal symptoms, CVA or TIA-like symptoms, palpitations or arrhythmias, blood in stool or urine.  No claudication-like issues, DVT or PE-like symptoms.    Had a recent echocardiogram ordered by Dr. Theador Hawthorne which showed  an LVEF of 55 to 60%.  Mild concentric LVH, grade 2 diastolic dysfunction, left atrium mild to moderately dilated, trivial mitral regurgitation, mild aortic valve regurgitation.  Past Medical History:  Diagnosis Date  . GERD (gastroesophageal reflux disease)   . Hypertension   . Myocardial infarction ([place])    1999  . Renal insufficiency   . Stroke [facility])    no deficits; had stroke while trying to place "stents in legs".    Past Surgical History:  Procedure Laterality Date  . ABDOMINAL AORTIC ANEURYSM REPAIR  2012   IN [place]  . APPENDECTOMY    . CORONARY ARTERY BYPASS GRAFT     3 vessels 18 years ago  . INGUINAL HERNIA REPAIR Right 09/20/2015   Procedure: REPAIR RIGHT INGUINAL HERNIA  WITH MESH;  Surgeon: Vickie Epley, MD;  Location: [facility];  Service: General;  Laterality: Right;  . 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 carvedilol (COREG) 12.5 MG tablet Take 6.25 mg by mouth 2 (two) times daily.    . clopidogrel (PLAVIX) 75 MG tablet Take 75 mg by mouth daily.     . furosemide (LASIX) 40 MG tablet Take 40 mg by mouth daily.     . rosuvastatin (CRESTOR) 20 MG tablet Take 20 mg by mouth daily.     . tamsulosin (FLOMAX) 0.4 MG CAPS capsule Take 1 capsule (0.4 mg total) by mouth daily after supper. 90 capsule 3  . traMADol (ULTRAM) 50 MG tablet Take by mouth every 6 (  six) hours as needed.    . Vitamin D, Ergocalciferol, (DRISDOL) 1.25 MG (50000 UNIT) CAPS capsule Take 50,000 Units by mouth once a week.    . zolpidem (AMBIEN) 10 MG tablet Take 10 mg by mouth at bedtime.    Marland Kitchen losartan (COZAAR) 25 MG tablet Take 1 tablet (25 mg total) by mouth daily. 30 tablet 1   No current facility-administered medications for this visit.   Allergies:  Patient has no known allergies.   Social History: The patient  reports that he has been smoking cigarettes. He started smoking about 61 years ago. He has a 100.00 pack-year smoking  history. He has never used smokeless tobacco. He reports that he does not drink alcohol or use drugs.   Family History: The patient's family history includes Cancer in his father.   ROS:  Please see the history of present illness. Otherwise, complete review of systems is positive for none.  All other systems are reviewed and negative.   Physical Exam: VS:  BP (!) 150/60   Pulse 60   Ht 6\' 2"  (1.88 m)   Wt 202 lb 12.8 oz (92 kg)   SpO2 98%   BMI 26.04 kg/m , BMI Body mass index is 26.04 kg/m.  Wt Readings from Last 3 Encounters:  06/14/19 202 lb 12.8 oz (92 kg)  05/17/19 203 lb 12.8 oz (92.4 kg)  03/15/19 200 lb (90.7 kg)    General: Patient appears comfortable at rest. Neck: Supple, no elevated JVP or carotid bruits, no thyromegaly. Lungs: Clear to auscultation, nonlabored breathing at rest. Cardiac: Regular rate and rhythm, no S3 or significant systolic murmur, no pericardial rub. Extremities: Bilateral 1+ pitting edema, distal pulses 2+. Skin: Warm and dry. Musculoskeletal: No kyphosis. Neuropsychiatric: Alert and oriented x3, affect grossly appropriate.  ECG:  An ECG dated 06/14/2019 was personally reviewed today and demonstrated:  Sinus rhythm rate of 66 occasional PAC  Recent Labwork: No results found for requested labs within last 8760 hours.  No results found for: CHOL, TRIG, HDL, CHOLHDL, VLDL, LDLCALC, LDLDIRECT  Other Studies Reviewed Today:   Nuclear stress Myoview 08/22/2016  Blood pressure demonstrated a hypertensive response to exercise.  There was no ST segment deviation noted during stress.  Defect 1: There is a medium defect of mild severity present in the basal inferior, mid inferior and apical inferior location. This appears to be due to myocardial scar. No ischemic territories.  This is a low risk study.  Nuclear stress EF: 56%.  Echocardiogram 05/11/2019 ordered by Dr. Theador Hawthorne  1. Left ventricular ejection fraction, by estimation, is 55 to 60%.  The left ventricle has normal function. The left ventricle has no regional wall motion abnormalities. There is mild concentric left ventricular hypertrophy. Left ventricular diastolic parameters are consistent with Grade II diastolic dysfunction (pseudonormalization). 2. Right ventricular systolic function is low normal. The right ventricular size is normal. There is normal pulmonary artery systolic pressure. 3. Left atrial size was mild to moderately dilated. 4. The mitral valve is grossly normal. Trivial mitral valve regurgitation. 5. The aortic valve is tricuspid. Aortic valve regurgitation is mild. No aortic stenosis is present. 6. The inferior vena cava is dilated in size with >50% respiratory variability, suggesting right atrial pressure of 8 mmHg.  Assessment and Plan:  1. CAD in native artery   2. S/P AAA (abdominal aortic aneurysm) repair   3. CRF (chronic renal failure), stage 5 ([place])   4. Essential hypertension   5. Mixed hyperlipidemia  1. CAD in native artery Status post bypass in 1999.  Patient denies any recent progressive anginal or exertional symptoms.  Continue carvedilol 12.5 mg p.o. twice daily, Plavix 75 mg daily.  2. S/P AAA (abdominal aortic aneurysm) repair Status post aortic aneurysm repair of a 6.4 cm aneurysm in 01/29/2012.  Patient having no issues.  3. CRF (chronic renal failure), stage 5 (HCC) Patient has end-stage renal disease and sees Dr. Theador Hawthorne.  He has a pending vascular consult for AV fistula placement to start hemodialysis.  4. Essential hypertension Blood pressure today is 150/60 with a heart rate of 60.  He has some lower extremity edema which may be attributed to amlodipine at Ronald dosage of 10 mg.  We will stop amlodipine and start losartan 25 mg daily.  He is to start taking his blood pressures once daily after starting the medication.  Follow-up in 2 weeks with a nursing visit and bring a log of blood pressures with him during that visit.    5. Mixed hyperlipidemia Continue statin therapy with Crestor 20 mg daily.  No recent lipid panel available.  We will attempt to obtain a recent lipid study from PCP.  Medication Adjustments/Labs and Tests Ordered: Current medicines are reviewed at length with the patient today.  Concerns regarding medicines are outlined above.   Disposition: Follow-up with Dr. Johnsie Cancel or APP in 1 month  Signed, Levell July, NP 06/14/2019 1:00 PM    [facility], [place], [place] [address] Phone: [phone]; Fax: [phone]

## 2019-06-14 ENCOUNTER — Encounter: Payer: Self-pay | Admitting: Family Medicine

## 2019-06-14 ENCOUNTER — Ambulatory Visit (INDEPENDENT_AMBULATORY_CARE_PROVIDER_SITE_OTHER): Payer: Medicare Other | Admitting: Family Medicine

## 2019-06-14 ENCOUNTER — Other Ambulatory Visit: Payer: Self-pay

## 2019-06-14 VITALS — BP 150/60 | HR 60 | Ht 74.0 in | Wt 202.8 lb

## 2019-06-14 DIAGNOSIS — I2510 Atherosclerotic heart disease of native coronary artery without angina pectoris: Secondary | ICD-10-CM | POA: Diagnosis not present

## 2019-06-14 DIAGNOSIS — Z9889 Other specified postprocedural states: Secondary | ICD-10-CM

## 2019-06-14 DIAGNOSIS — Z8679 Personal history of other diseases of the circulatory system: Secondary | ICD-10-CM

## 2019-06-14 DIAGNOSIS — I10 Essential (primary) hypertension: Secondary | ICD-10-CM | POA: Diagnosis not present

## 2019-06-14 DIAGNOSIS — N185 Chronic kidney disease, stage 5: Secondary | ICD-10-CM

## 2019-06-14 DIAGNOSIS — E782 Mixed hyperlipidemia: Secondary | ICD-10-CM | POA: Diagnosis not present

## 2019-06-14 MED ORDER — LOSARTAN POTASSIUM 25 MG PO TABS: 25.0000 mg | ORAL_TABLET | Freq: Every day | ORAL | 1 refills | Status: DC

## 2019-06-14 MED ORDER — LOSARTAN POTASSIUM 25 MG PO TABS: 25.0000 mg | ORAL_TABLET | Freq: Every day | ORAL | 3 refills | Status: DC

## 2019-06-14 NOTE — Patient Instructions (Addendum)
Medication Instructions:   Your physician has recommended you make the following change in your medication:   Stop amlodipine  Start losartan 25 mg by mouth daily  Continue other medications the same  Labwork:  NONE  Testing/Procedures:  NONE  Follow-Up:  Your physician recommends that you schedule a follow-up appointment in: 1 month (office)  Your physician recommends that you schedule a follow-up appointment in: 2 weeks for a nurse visit to recheck your blood pressure.  Any Other Special Instructions Will Be Listed Below (If Applicable). Your physician has requested that you regularly monitor and record your blood pressure readings at home. Please use the same machine at the same time of day to check your readings and record them to bring to your nurse visit.  If you need a refill on your cardiac medications before your next appointment, please call your pharmacy.

## 2019-06-27 DIAGNOSIS — N189 Chronic kidney disease, unspecified: Secondary | ICD-10-CM | POA: Diagnosis not present

## 2019-06-27 DIAGNOSIS — E211 Secondary hyperparathyroidism, not elsewhere classified: Secondary | ICD-10-CM | POA: Diagnosis not present

## 2019-06-27 DIAGNOSIS — I5032 Chronic diastolic (congestive) heart failure: Secondary | ICD-10-CM | POA: Diagnosis not present

## 2019-06-27 DIAGNOSIS — N185 Chronic kidney disease, stage 5: Secondary | ICD-10-CM | POA: Diagnosis not present

## 2019-06-27 DIAGNOSIS — N170 Acute kidney failure with tubular necrosis: Secondary | ICD-10-CM | POA: Diagnosis not present

## 2019-06-29 ENCOUNTER — Ambulatory Visit: Payer: Medicare Other | Admitting: *Deleted

## 2019-06-29 ENCOUNTER — Other Ambulatory Visit: Payer: Self-pay

## 2019-06-29 DIAGNOSIS — I10 Essential (primary) hypertension: Secondary | ICD-10-CM

## 2019-06-29 NOTE — Progress Notes (Signed)
Can you increase dose of Losartan to 50 mg daily. His BP needs to come down.  Tell him to do the same thing with his blood pressure after he starts taking the new dose and bring a log of the blood pressures in 2 weeks just like he did today to see if we need to adjust or add on another medication.  Thanks

## 2019-06-29 NOTE — Progress Notes (Signed)
Pt here for BP check per recent office appt with Katina Dung, NP - pt stopped amlodipine and started losartan 25 mg daily - denies any symptoms or complaints - has been taking losartan 25 mg daily - brought in home BP readings - BP by nurse check is 148/78 HR 61   Home BP readings given to provider - pt says he check BP 1-2 hours after taking medications

## 2019-06-30 DIAGNOSIS — E559 Vitamin D deficiency, unspecified: Secondary | ICD-10-CM | POA: Diagnosis not present

## 2019-06-30 DIAGNOSIS — D631 Anemia in chronic kidney disease: Secondary | ICD-10-CM | POA: Diagnosis not present

## 2019-06-30 DIAGNOSIS — N185 Chronic kidney disease, stage 5: Secondary | ICD-10-CM | POA: Diagnosis not present

## 2019-06-30 DIAGNOSIS — I5032 Chronic diastolic (congestive) heart failure: Secondary | ICD-10-CM | POA: Diagnosis not present

## 2019-06-30 DIAGNOSIS — E211 Secondary hyperparathyroidism, not elsewhere classified: Secondary | ICD-10-CM | POA: Diagnosis not present

## 2019-06-30 NOTE — Addendum Note (Signed)
Addended by: Julian Hy T on: 06/30/2019 11:34 AM   Modules accepted: Orders

## 2019-06-30 NOTE — Progress Notes (Signed)
Pt voiced understanding - updated medication list and pt will call or bring in BP readings in 2 weeks

## 2019-07-04 DIAGNOSIS — Z0001 Encounter for general adult medical examination with abnormal findings: Secondary | ICD-10-CM | POA: Diagnosis not present

## 2019-07-04 DIAGNOSIS — M545 Low back pain: Secondary | ICD-10-CM | POA: Diagnosis not present

## 2019-07-04 DIAGNOSIS — E782 Mixed hyperlipidemia: Secondary | ICD-10-CM | POA: Diagnosis not present

## 2019-07-04 DIAGNOSIS — N184 Chronic kidney disease, stage 4 (severe): Secondary | ICD-10-CM | POA: Diagnosis not present

## 2019-07-04 DIAGNOSIS — I10 Essential (primary) hypertension: Secondary | ICD-10-CM | POA: Diagnosis not present

## 2019-07-05 DIAGNOSIS — N184 Chronic kidney disease, stage 4 (severe): Secondary | ICD-10-CM | POA: Diagnosis not present

## 2019-07-05 DIAGNOSIS — I10 Essential (primary) hypertension: Secondary | ICD-10-CM | POA: Diagnosis not present

## 2019-07-05 DIAGNOSIS — I129 Hypertensive chronic kidney disease with stage 1 through stage 4 chronic kidney disease, or unspecified chronic kidney disease: Secondary | ICD-10-CM | POA: Diagnosis not present

## 2019-07-05 DIAGNOSIS — E785 Hyperlipidemia, unspecified: Secondary | ICD-10-CM | POA: Diagnosis not present

## 2019-07-05 DIAGNOSIS — K219 Gastro-esophageal reflux disease without esophagitis: Secondary | ICD-10-CM | POA: Diagnosis not present

## 2019-07-06 ENCOUNTER — Other Ambulatory Visit: Payer: Self-pay | Admitting: Family Medicine

## 2019-07-06 ENCOUNTER — Other Ambulatory Visit: Payer: Self-pay | Admitting: *Deleted

## 2019-07-06 ENCOUNTER — Telehealth: Payer: Self-pay | Admitting: Family Medicine

## 2019-07-06 MED ORDER — LOSARTAN POTASSIUM 50 MG PO TABS: 50.0000 mg | ORAL_TABLET | Freq: Every day | ORAL | 6 refills | Status: DC

## 2019-07-15 ENCOUNTER — Ambulatory Visit: Payer: Medicare Other | Admitting: Family Medicine

## 2019-07-15 DIAGNOSIS — E785 Hyperlipidemia, unspecified: Secondary | ICD-10-CM | POA: Diagnosis not present

## 2019-07-15 DIAGNOSIS — I10 Essential (primary) hypertension: Secondary | ICD-10-CM | POA: Diagnosis not present

## 2019-07-15 DIAGNOSIS — N184 Chronic kidney disease, stage 4 (severe): Secondary | ICD-10-CM | POA: Diagnosis not present

## 2019-07-15 DIAGNOSIS — I129 Hypertensive chronic kidney disease with stage 1 through stage 4 chronic kidney disease, or unspecified chronic kidney disease: Secondary | ICD-10-CM | POA: Diagnosis not present

## 2019-07-15 DIAGNOSIS — K219 Gastro-esophageal reflux disease without esophagitis: Secondary | ICD-10-CM | POA: Diagnosis not present

## 2019-07-26 ENCOUNTER — Other Ambulatory Visit: Payer: Self-pay | Admitting: *Deleted

## 2019-07-26 DIAGNOSIS — N184 Chronic kidney disease, stage 4 (severe): Secondary | ICD-10-CM

## 2019-08-03 ENCOUNTER — Other Ambulatory Visit (HOSPITAL_COMMUNITY): Payer: Medicare Other

## 2019-08-03 ENCOUNTER — Encounter: Payer: Medicare Other | Admitting: Vascular Surgery

## 2019-08-03 ENCOUNTER — Encounter (HOSPITAL_COMMUNITY): Payer: Medicare Other

## 2019-08-10 ENCOUNTER — Other Ambulatory Visit: Payer: Self-pay

## 2019-08-10 ENCOUNTER — Other Ambulatory Visit (HOSPITAL_COMMUNITY)
Admission: RE | Admit: 2019-08-10 | Discharge: 2019-08-10 | Disposition: A | Discharge status: Home / Self Care | Payer: Medicare Other | Source: Ambulatory Visit | Attending: Nephrology | Admitting: Nephrology

## 2019-08-10 DIAGNOSIS — I5032 Chronic diastolic (congestive) heart failure: Secondary | ICD-10-CM | POA: Insufficient documentation

## 2019-08-10 DIAGNOSIS — N185 Chronic kidney disease, stage 5: Secondary | ICD-10-CM | POA: Insufficient documentation

## 2019-08-10 LAB — CBC
HCT: 40.6 % (ref 39.0–52.0)
Hemoglobin: 12.9 g/dL — ABNORMAL LOW (ref 13.0–17.0)
MCH: 28.7 pg (ref 26.0–34.0)
MCHC: 31.8 g/dL (ref 30.0–36.0)
MCV: 90.4 fL (ref 80.0–100.0)
Platelets: 160 10*3/uL (ref 150–400)
RBC: 4.49 MIL/uL (ref 4.22–5.81)
RDW: 14.6 % (ref 11.5–15.5)
WBC: 4.7 10*3/uL (ref 4.0–10.5)
nRBC: 0 % (ref 0.0–0.2)

## 2019-08-10 LAB — RENAL FUNCTION PANEL
Albumin: 3.5 g/dL (ref 3.5–5.0)
Anion gap: 11 (ref 5–15)
BUN: 47 mg/dL — ABNORMAL HIGH (ref 8–23)
CO2: 25 mmol/L (ref 22–32)
Calcium: 8.5 mg/dL — ABNORMAL LOW (ref 8.9–10.3)
Chloride: 104 mmol/L (ref 98–111)
Creatinine, Ser: 4.15 mg/dL — ABNORMAL HIGH (ref 0.61–1.24)
GFR calc Af Amer: 15 mL/min — ABNORMAL LOW (ref >60)
GFR calc non Af Amer: 13 mL/min — ABNORMAL LOW (ref >60)
Glucose, Bld: 105 mg/dL — ABNORMAL HIGH (ref 70–99)
Phosphorus: 4.4 mg/dL (ref 2.5–4.6)
Potassium: 3.6 mmol/L (ref 3.5–5.1)
Sodium: 140 mmol/L (ref 135–145)

## 2019-08-10 LAB — PROTEIN / CREATININE RATIO, URINE
Creatinine, Urine: 117.04 mg/dL
Protein Creatinine Ratio: 1.27 mg/mg{Cre} — ABNORMAL HIGH (ref 0.00–0.15)
Total Protein, Urine: 149 mg/dL

## 2019-08-10 LAB — VITAMIN D 25 HYDROXY (VIT D DEFICIENCY, FRACTURES): Vit D, 25-Hydroxy: 30.38 ng/mL (ref 30–100)

## 2019-08-11 DIAGNOSIS — I5032 Chronic diastolic (congestive) heart failure: Secondary | ICD-10-CM | POA: Diagnosis not present

## 2019-08-11 DIAGNOSIS — R809 Proteinuria, unspecified: Secondary | ICD-10-CM | POA: Diagnosis not present

## 2019-08-11 DIAGNOSIS — D631 Anemia in chronic kidney disease: Secondary | ICD-10-CM | POA: Diagnosis not present

## 2019-08-11 DIAGNOSIS — E211 Secondary hyperparathyroidism, not elsewhere classified: Secondary | ICD-10-CM | POA: Diagnosis not present

## 2019-08-11 DIAGNOSIS — N185 Chronic kidney disease, stage 5: Secondary | ICD-10-CM | POA: Diagnosis not present

## 2019-08-18 DIAGNOSIS — K219 Gastro-esophageal reflux disease without esophagitis: Secondary | ICD-10-CM | POA: Diagnosis not present

## 2019-08-18 DIAGNOSIS — N184 Chronic kidney disease, stage 4 (severe): Secondary | ICD-10-CM | POA: Diagnosis not present

## 2019-08-18 DIAGNOSIS — E785 Hyperlipidemia, unspecified: Secondary | ICD-10-CM | POA: Diagnosis not present

## 2019-08-18 DIAGNOSIS — I129 Hypertensive chronic kidney disease with stage 1 through stage 4 chronic kidney disease, or unspecified chronic kidney disease: Secondary | ICD-10-CM | POA: Diagnosis not present

## 2019-09-08 ENCOUNTER — Other Ambulatory Visit: Payer: Self-pay

## 2019-09-08 DIAGNOSIS — C61 Malignant neoplasm of prostate: Secondary | ICD-10-CM

## 2019-09-13 NOTE — Addendum Note (Signed)
Addended by: Tommy Rainwater on: 09/13/2019 01:15 PM   Modules accepted: Orders

## 2019-09-14 ENCOUNTER — Other Ambulatory Visit: Payer: Self-pay | Admitting: *Deleted

## 2019-09-14 DIAGNOSIS — N184 Chronic kidney disease, stage 4 (severe): Secondary | ICD-10-CM

## 2019-09-19 ENCOUNTER — Other Ambulatory Visit: Payer: Medicare Other

## 2019-09-20 ENCOUNTER — Ambulatory Visit: Payer: Medicare Other | Admitting: Urology

## 2019-09-23 ENCOUNTER — Other Ambulatory Visit: Payer: Self-pay

## 2019-09-23 ENCOUNTER — Other Ambulatory Visit: Payer: Medicare Other

## 2019-09-23 DIAGNOSIS — C61 Malignant neoplasm of prostate: Secondary | ICD-10-CM

## 2019-09-24 LAB — TESTOSTERONE: Testosterone: 139 ng/dL — ABNORMAL LOW (ref 264–916)

## 2019-09-24 LAB — PSA: Prostate Specific Ag, Serum: <0.1 ng/mL (ref 0.0–4.0)

## 2019-09-26 DIAGNOSIS — E559 Vitamin D deficiency, unspecified: Secondary | ICD-10-CM | POA: Diagnosis not present

## 2019-09-27 ENCOUNTER — Encounter: Payer: Self-pay | Admitting: Urology

## 2019-09-27 ENCOUNTER — Other Ambulatory Visit: Payer: Self-pay

## 2019-09-27 ENCOUNTER — Ambulatory Visit (INDEPENDENT_AMBULATORY_CARE_PROVIDER_SITE_OTHER): Payer: Medicare Other | Admitting: Urology

## 2019-09-27 VITALS — BP 154/78 | HR 62 | Temp 97.9°F | Ht 74.0 in | Wt 202.8 lb

## 2019-09-27 DIAGNOSIS — I5032 Chronic diastolic (congestive) heart failure: Secondary | ICD-10-CM | POA: Diagnosis not present

## 2019-09-27 DIAGNOSIS — R809 Proteinuria, unspecified: Secondary | ICD-10-CM | POA: Diagnosis not present

## 2019-09-27 DIAGNOSIS — C61 Malignant neoplasm of prostate: Secondary | ICD-10-CM

## 2019-09-27 DIAGNOSIS — N185 Chronic kidney disease, stage 5: Secondary | ICD-10-CM | POA: Diagnosis not present

## 2019-09-27 DIAGNOSIS — E211 Secondary hyperparathyroidism, not elsewhere classified: Secondary | ICD-10-CM | POA: Diagnosis not present

## 2019-09-27 DIAGNOSIS — N189 Chronic kidney disease, unspecified: Secondary | ICD-10-CM | POA: Diagnosis not present

## 2019-09-27 LAB — MICROSCOPIC EXAMINATION
Bacteria, UA: NONE SEEN
Epithelial Cells (non renal): NONE SEEN /hpf (ref 0–10)
Renal Epithel, UA: NONE SEEN /hpf
WBC, UA: NONE SEEN /hpf (ref 0–5)

## 2019-09-27 LAB — URINALYSIS, ROUTINE W REFLEX MICROSCOPIC
Bilirubin, UA: NEGATIVE
Glucose, UA: NEGATIVE
Ketones, UA: NEGATIVE
Leukocytes,UA: NEGATIVE
Nitrite, UA: NEGATIVE
Specific Gravity, UA: 1.02 (ref 1.005–1.030)
Urobilinogen, Ur: 0.2 mg/dL (ref 0.2–1.0)
pH, UA: 6 (ref 5.0–7.5)

## 2019-09-27 IMAGING — CT CT PELVIS W/O CM
2 of 3 series · 14 of 46 positions shown, 16 images · non-contrast
Comparison: 08/07/2017 bone scan.

CLINICAL DATA: Malignant neoplasm of prostate.

EXAM:
CT PELVIS WITHOUT CONTRAST
TECHNIQUE: Multidetector CT imaging of the pelvis was performed following the
standard protocol without intravenous contrast. Elevated creatinine
3.7.

[Series 3: axial st · axial · 0.81mm/px · z∈[-571,-323]mm · 11 of 144 slices shown, 13 images]
[im 10/144  soft-tissue]
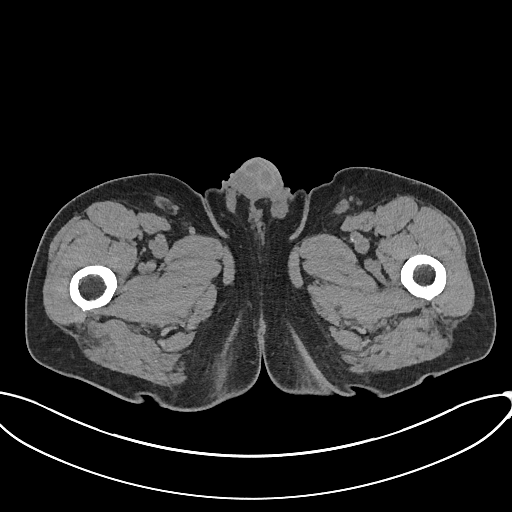
[im 10/144  bone]
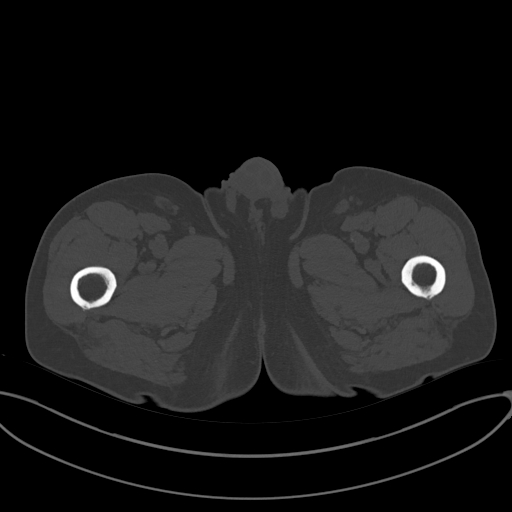
[im 24/144  soft-tissue]
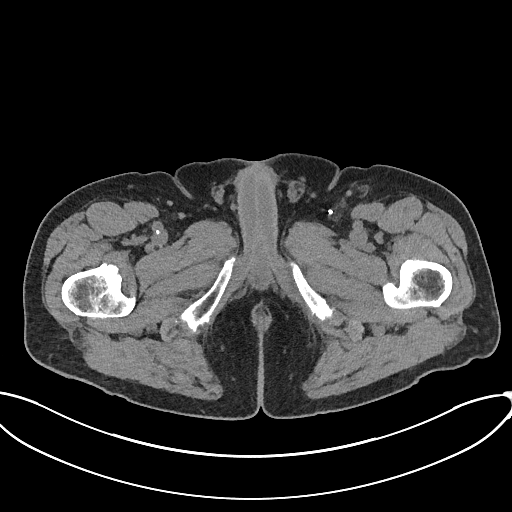
[im 33/144  soft-tissue]
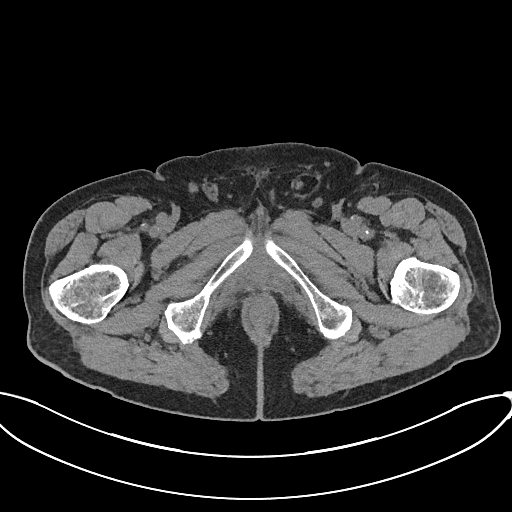
[im 47/144  soft-tissue]
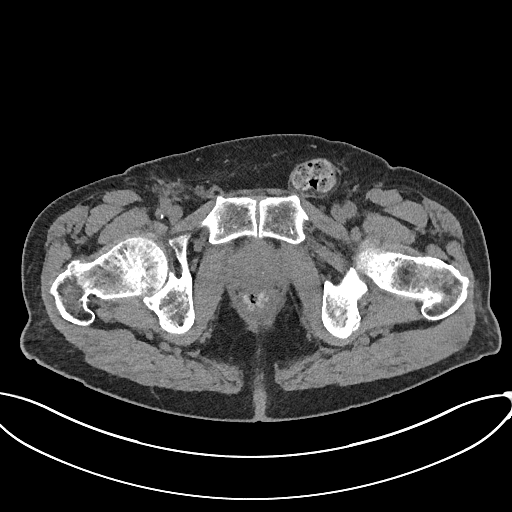
[im 60/144  soft-tissue]
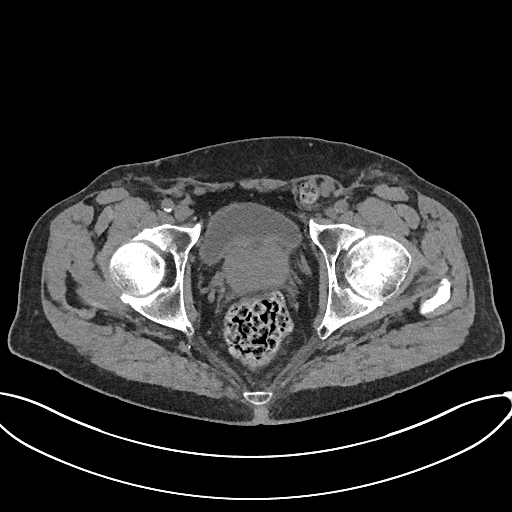
[im 74/144  soft-tissue]
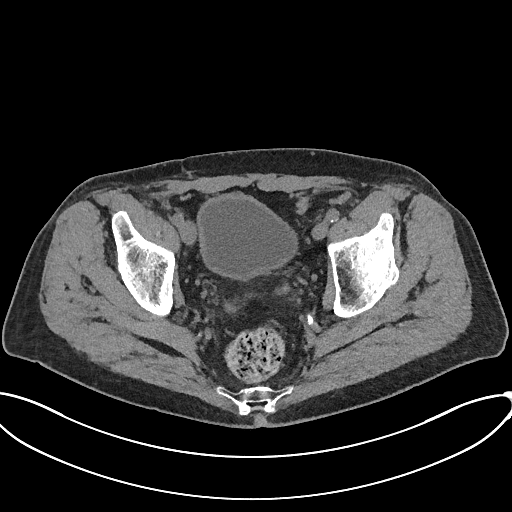
[im 84/144  soft-tissue]
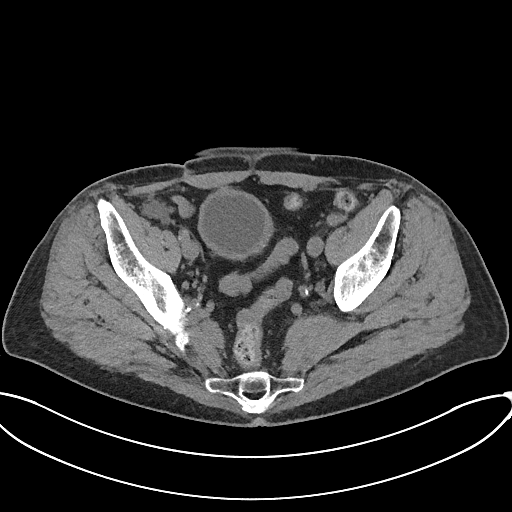
[im 97/144  soft-tissue]
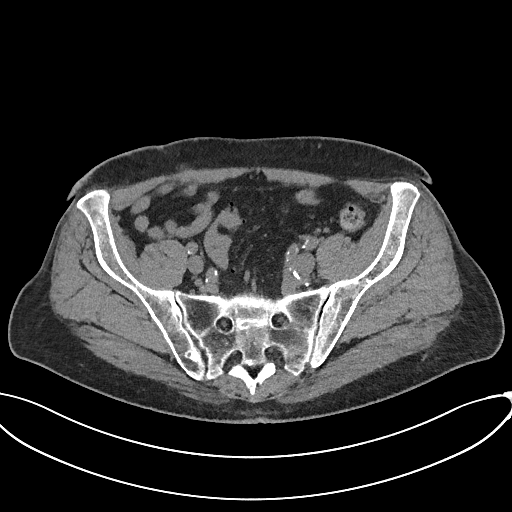
[im 111/144  soft-tissue]
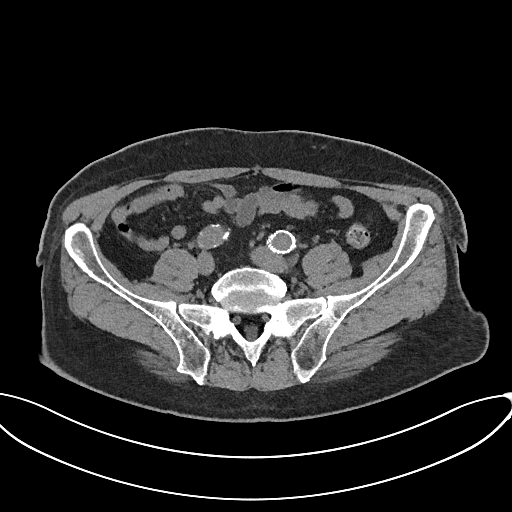
[im 111/144  bone]
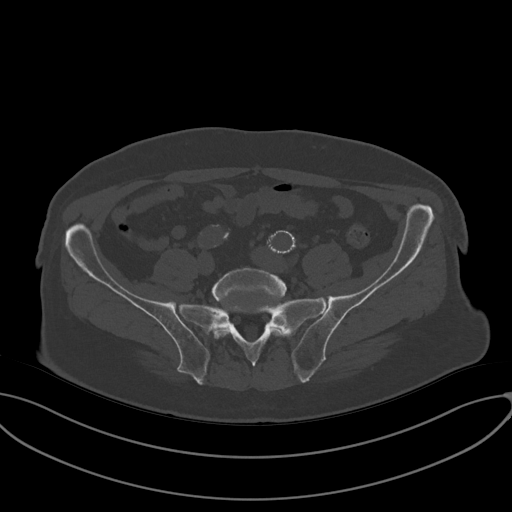
[im 120/144  soft-tissue]
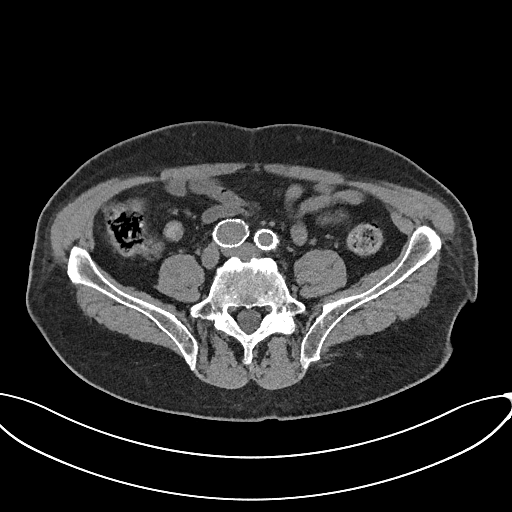
[im 134/144  soft-tissue]
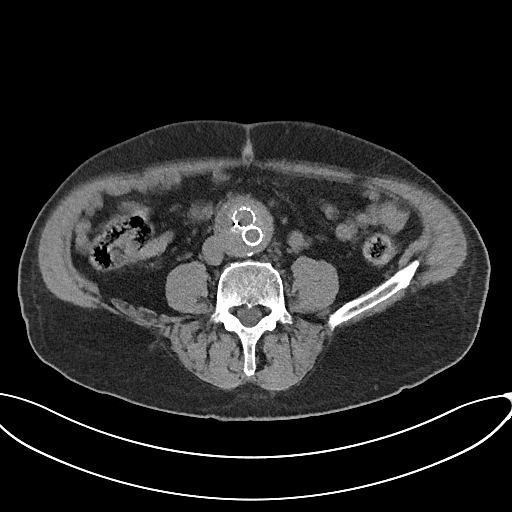

[Series 8: coronal st · coronal · 0.56mm/px · 3 of 122 slices shown]
[im 41/122  soft-tissue]
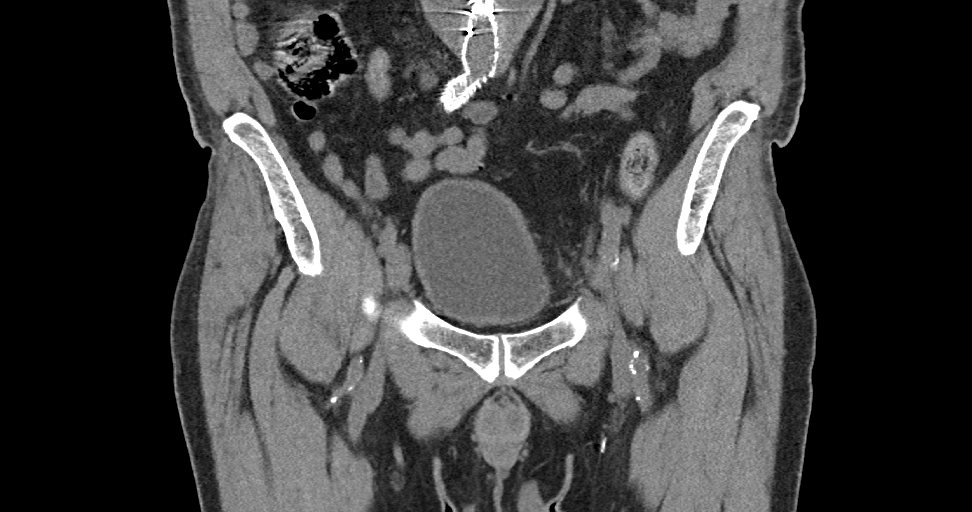
[im 54/122  soft-tissue]
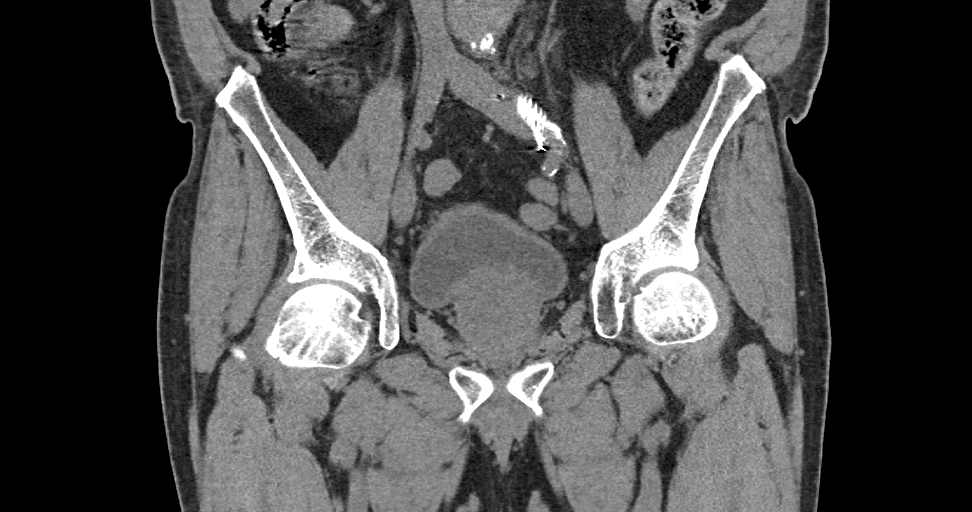
[im 68/122  soft-tissue]
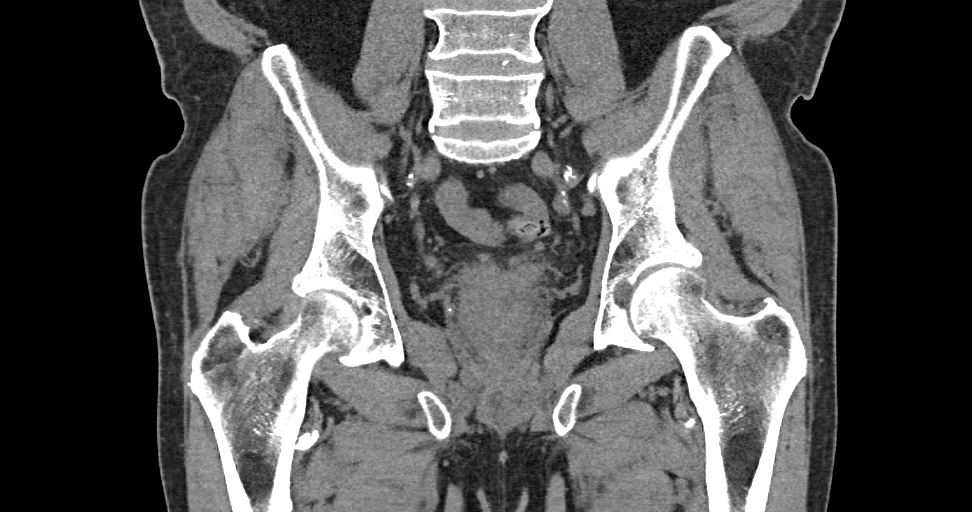

[14 of 46 positions shown; findings below may reference images not displayed]

FINDINGS: Urinary Tract: Mild diffuse bladder wall thickening. Bladder mildly
distended.

Bowel: Moderate left inguinal hernia contains a portion of the
proximal sigmoid colon. No bowel wall thickening, dilatation or
pneumatosis.

Vascular/Lymphatic: Abdominal aortic and iliofemoral
atherosclerosis. Partially visualized infrarenal abdominal aortic
aneurysm measuring at least 7.0 cm, with partially visualized aorto
bi-iliac stent graft. No pathologically enlarged pelvic lymph nodes.

Reproductive: Moderately enlarged prostate. Prostate dimensions
x 4.6 x 5.5 cm (volume = 61 cm^3). No discrete prostatic mass on
this noncontrast scan.

Other:  No pneumoperitoneum, ascites or focal fluid collection.

Musculoskeletal: Mild lower lumbar spondylosis. No aggressive
appearing focal osseous lesions.
IMPRESSION: 1. No lymphadenopathy or other findings of metastatic disease in the
pelvis.
2. Moderately enlarged prostate.
3. Nonspecific mild diffuse bladder wall thickening, probably due to
chronic bladder outlet obstruction by the enlarged prostate.
4. Moderate left inguinal hernia contains a portion of the proximal
sigmoid colon, with no evidence of bowel complication.
5.  Aortic Atherosclerosis ([id number]-R28.8).
6. Partially visualized abdominal aortic aneurysm status post aorto
bi-iliac stent graft repair.

## 2019-09-27 NOTE — Progress Notes (Signed)

## 2019-09-27 NOTE — Progress Notes (Signed)
H&P  Chief Complaint: Prostate Cancer  History of Present Illness:  8.17.2021: PSA <0.01 Testosterone: 140.   Pt denies any gross hematuria or blood in stool. Pt reports that he is no longer experiencing hot flashes. Pt submits no prostate related concerns or complaints at this time. Pt is concerned about his diminishing kidney function.  IPSS Questionnaire (AUA-7): Over the past month   1)  How often have you had a sensation of not emptying your bladder completely after you finish urinating?  0 - Not at all  2)  How often have you had to urinate again less than two hours after you finished urinating? 1 - Less than 1 time in 5  3)  How often have you found you stopped and started again several times when you urinated?  2 - Less than half the time  4) How difficult have you found it to postpone urination?  0 - Not at all  5) How often have you had a weak urinary stream?  4 - More than half the time  6) How often have you had to push or strain to begin urination?  0 - Not at all  7) How many times did you most typically get up to urinate from the time you went to bed until the time you got up in the morning?  3 - 3 times  Total score:  0-7 mildly symptomatic   8-19 moderately symptomatic   20-35 severely symptomatic   QOL score: 1  (below copied from [facility] records):  Roosevelt Eimers is a 79 year-old male established patient who is here evaluation for treatment of prostate cancer.  His prostate cancer was diagnosed 07/14/2017. He does have the pathology report from his biopsy. His most recent PSA is 21.3.   He has undergone External Beam Radiation Therapy for treatment. He has undergone Hormonal Therapy for treatment.   He does not have urinary incontinence. He has not recently had unwanted weight loss. He is not having pain in new locations.   ~ 20 years ago in [place] had what sounds like a TURP. He is not sure if PCa was diagnosed at that time, but no real treatments were needed in the  few years he followed up after that. PSA in 2016 was around 8, recently 21.3 in March, 2019.  6.4.2019: TRUS/Bx --volume 61 ml. 11/12 cores positive;  7 with GS 4+3 pattern, 4 with GS 3+4 pattern.  CT (7.26)/bone scan(6.28) negative for metastatic disease.   IPSS 14  QOL score 3   11.5.2019: He was initiated on ADT with Firmagon 240 mg. Fiducial markers were placed in advance of EBRT by Dr Harlen Labs.   12.17.2019: Here for 4 mo Lupron. PSA 1.2. T < 10.   4.28.2020: He completed EBRT by Dr Francesca Jewett  (Prostate + SV + Pelvic LNs IMRT 6 MV 180 cGy 25 4500 cGy 01.07.2020-  02.13.2020 Prostate + SV IMRT 6 MV 180 cGy 19 3420 cGy 02.14.2020-03.13.2020). PSA 0.1, T<10.   9.1.2020: Worsened LUTS. Only on 1 flomax/day. Does c/o low energy. No blood in urine or stool. Most recent PSA 0.1, testosterone level less than 10.   4.6.2021: Here today for follow-up. Most recent PSA undetectable with T levels remaining castrate. He remains on tamsulosin 1x daily. He reports today that he is no longer having issues with urination and is no longer considering surgical management.    Past Medical History:  Diagnosis Date   GERD (gastroesophageal reflux disease)    Hypertension  Myocardial infarction [facility])    1999   Renal insufficiency    Stroke [facility])    no deficits; had stroke while trying to place "stents in legs".    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 If you have any questions, ask  your nurse or doctor.        carvedilol 12.5 MG tablet Commonly known as: COREG Take 6.25 mg by mouth 2 (two) times daily.   clopidogrel 75 MG tablet Commonly known as: PLAVIX Take 75 mg by mouth daily.   furosemide 40 MG tablet Commonly known as: LASIX Take 40 mg by mouth daily.   losartan 50 MG tablet Commonly known as: COZAAR Take 1 tablet (50 mg total) by mouth daily.   rosuvastatin 20 MG tablet Commonly known as: CRESTOR Take 20 mg by mouth daily.   tamsulosin 0.4 MG Caps capsule Commonly known as: FLOMAX Take 1 capsule (0.4 mg total) by mouth daily after supper.   traMADol 50 MG tablet Commonly known as: ULTRAM Take by mouth every 6 (six) hours as needed.   Vitamin D (Ergocalciferol) 1.25 MG (50000 UNIT) Caps capsule Commonly known as: DRISDOL Take 50,000 Units by mouth once a week.   zolpidem 10 MG tablet Commonly known as: AMBIEN Take 10 mg by mouth at bedtime.       Allergies: No Known Allergies  Family History  Problem Relation Age of Onset   Cancer Father     Social History:  reports that he has been smoking cigarettes. He started smoking about 62 years ago. He has a 100.00 pack-year smoking history. He has never used smokeless tobacco. He reports that he does not drink alcohol and does not use drugs.  ROS: A complete review of systems was performed.  All systems are negative except for pertinent findings as noted.  Physical Exam:  Vital signs in last 24 hours: There were no vitals taken for this visit. Constitutional:  Alert and oriented, No acute distress Cardiovascular: Regular rate  Respiratory: Normal respiratory effort GI: Abdomen is soft, nontender, nondistended, no abdominal masses. No CVAT. No hernias Genitourinary: Uncircumcised male phallus, Atrophic testes are descended bilaterally and non-tender and without masses, scrotum is normal in appearance without lesions or masses, perineum is normal on inspection. Prostate is smooth and  flat. Lymphatic: No lymphadenopathy Neurologic: Grossly intact, no focal deficits Psychiatric: Normal mood and affect  I have reviewed prior pt notes  I have reviewed notes from referring/previous physicians  I have reviewed urinalysis results  I have independently reviewed prior surgical pathology  I have reviewed prior PSA/testosterone results   Impression/Assessment:  Prostate cancer, unfavorable intermediate risk group, status post combination external beam radiotherapy and long-term androgen deprivation therapy with excellent PSA response with minimal sequelae from radiotherapy  Plan:  1. Pt advised regarding his diminished kidney function and the prospect of dialysis.  2. F/U in 6 months for OV, PSA, and Total Testosterone.  CC: Dr. [place] Crane

## 2019-09-28 ENCOUNTER — Encounter (HOSPITAL_COMMUNITY): Payer: Medicare Other

## 2019-09-28 ENCOUNTER — Encounter: Payer: Medicare Other | Admitting: Vascular Surgery

## 2019-09-28 ENCOUNTER — Inpatient Hospital Stay (HOSPITAL_COMMUNITY): Admission: RE | Admit: 2019-09-28 | Payer: Medicare Other | Source: Ambulatory Visit

## 2019-09-29 DIAGNOSIS — D631 Anemia in chronic kidney disease: Secondary | ICD-10-CM | POA: Diagnosis not present

## 2019-09-29 DIAGNOSIS — I129 Hypertensive chronic kidney disease with stage 1 through stage 4 chronic kidney disease, or unspecified chronic kidney disease: Secondary | ICD-10-CM | POA: Diagnosis not present

## 2019-09-29 DIAGNOSIS — N185 Chronic kidney disease, stage 5: Secondary | ICD-10-CM | POA: Diagnosis not present

## 2019-09-29 DIAGNOSIS — E211 Secondary hyperparathyroidism, not elsewhere classified: Secondary | ICD-10-CM | POA: Diagnosis not present

## 2019-09-29 DIAGNOSIS — I5032 Chronic diastolic (congestive) heart failure: Secondary | ICD-10-CM | POA: Diagnosis not present

## 2019-10-06 DIAGNOSIS — Z0000 Encounter for general adult medical examination without abnormal findings: Secondary | ICD-10-CM | POA: Diagnosis not present

## 2019-10-06 DIAGNOSIS — D692 Other nonthrombocytopenic purpura: Secondary | ICD-10-CM | POA: Diagnosis not present

## 2019-10-06 DIAGNOSIS — I129 Hypertensive chronic kidney disease with stage 1 through stage 4 chronic kidney disease, or unspecified chronic kidney disease: Secondary | ICD-10-CM | POA: Diagnosis not present

## 2019-10-06 DIAGNOSIS — M545 Low back pain: Secondary | ICD-10-CM | POA: Diagnosis not present

## 2019-10-11 DIAGNOSIS — E782 Mixed hyperlipidemia: Secondary | ICD-10-CM | POA: Diagnosis not present

## 2019-10-11 DIAGNOSIS — M545 Low back pain: Secondary | ICD-10-CM | POA: Diagnosis not present

## 2019-10-11 DIAGNOSIS — N184 Chronic kidney disease, stage 4 (severe): Secondary | ICD-10-CM | POA: Diagnosis not present

## 2019-10-11 DIAGNOSIS — I10 Essential (primary) hypertension: Secondary | ICD-10-CM | POA: Diagnosis not present

## 2019-11-10 DIAGNOSIS — M545 Low back pain: Secondary | ICD-10-CM | POA: Diagnosis not present

## 2019-11-10 DIAGNOSIS — I129 Hypertensive chronic kidney disease with stage 1 through stage 4 chronic kidney disease, or unspecified chronic kidney disease: Secondary | ICD-10-CM | POA: Diagnosis not present

## 2019-11-10 DIAGNOSIS — E782 Mixed hyperlipidemia: Secondary | ICD-10-CM | POA: Diagnosis not present

## 2019-11-10 DIAGNOSIS — N184 Chronic kidney disease, stage 4 (severe): Secondary | ICD-10-CM | POA: Diagnosis not present

## 2019-11-11 DIAGNOSIS — E211 Secondary hyperparathyroidism, not elsewhere classified: Secondary | ICD-10-CM | POA: Diagnosis not present

## 2019-11-11 DIAGNOSIS — N185 Chronic kidney disease, stage 5: Secondary | ICD-10-CM | POA: Diagnosis not present

## 2019-11-11 DIAGNOSIS — I5032 Chronic diastolic (congestive) heart failure: Secondary | ICD-10-CM | POA: Diagnosis not present

## 2019-11-11 DIAGNOSIS — N189 Chronic kidney disease, unspecified: Secondary | ICD-10-CM | POA: Diagnosis not present

## 2019-11-11 DIAGNOSIS — I129 Hypertensive chronic kidney disease with stage 1 through stage 4 chronic kidney disease, or unspecified chronic kidney disease: Secondary | ICD-10-CM | POA: Diagnosis not present

## 2019-11-14 DIAGNOSIS — D631 Anemia in chronic kidney disease: Secondary | ICD-10-CM | POA: Diagnosis not present

## 2019-11-14 DIAGNOSIS — I120 Hypertensive chronic kidney disease with stage 5 chronic kidney disease or end stage renal disease: Secondary | ICD-10-CM | POA: Diagnosis not present

## 2019-11-14 DIAGNOSIS — N185 Chronic kidney disease, stage 5: Secondary | ICD-10-CM | POA: Diagnosis not present

## 2019-11-17 ENCOUNTER — Other Ambulatory Visit: Payer: Self-pay

## 2019-11-17 DIAGNOSIS — I129 Hypertensive chronic kidney disease with stage 1 through stage 4 chronic kidney disease, or unspecified chronic kidney disease: Secondary | ICD-10-CM | POA: Diagnosis not present

## 2019-11-17 DIAGNOSIS — E211 Secondary hyperparathyroidism, not elsewhere classified: Secondary | ICD-10-CM | POA: Diagnosis not present

## 2019-11-17 DIAGNOSIS — I5032 Chronic diastolic (congestive) heart failure: Secondary | ICD-10-CM | POA: Diagnosis not present

## 2019-11-17 DIAGNOSIS — N184 Chronic kidney disease, stage 4 (severe): Secondary | ICD-10-CM

## 2019-11-17 DIAGNOSIS — N185 Chronic kidney disease, stage 5: Secondary | ICD-10-CM | POA: Diagnosis not present

## 2019-11-17 DIAGNOSIS — D631 Anemia in chronic kidney disease: Secondary | ICD-10-CM | POA: Diagnosis not present

## 2019-11-24 ENCOUNTER — Ambulatory Visit (HOSPITAL_COMMUNITY): Admission: RE | Admit: 2019-11-24 | Payer: Medicare Other | Source: Ambulatory Visit

## 2019-11-28 ENCOUNTER — Ambulatory Visit: Payer: Medicare Other | Admitting: Vascular Surgery

## 2019-11-28 ENCOUNTER — Encounter: Payer: Self-pay | Admitting: Vascular Surgery

## 2019-11-28 ENCOUNTER — Other Ambulatory Visit: Payer: Self-pay

## 2019-11-28 VITALS — BP 147/80 | HR 52 | Temp 97.7°F | Resp 14 | Ht 74.0 in | Wt 199.0 lb

## 2019-11-28 DIAGNOSIS — N184 Chronic kidney disease, stage 4 (severe): Secondary | ICD-10-CM | POA: Diagnosis not present

## 2019-11-28 NOTE — Progress Notes (Signed)
[facility] [place]  Patient name: Ronald [name] MRN: [id number] DOB: [birth year] Sex: male  REASON FOR CONSULT: Discuss access for hemodialysis  HPI: BAYDEN [name] is a 79 y.o. male, here today for discussion of access for hemodialysis.  He is here today with his wife.  He has progressive renal insufficiency and is approaching need for dialysis.  He is stage V.  He has no history of pacemaker.  No history of anticoagulation and has had no prior arm access.  Past Medical History:  Diagnosis Date  . GERD (gastroesophageal reflux disease)   . Hypertension   . Myocardial infarction ([place])    1999  . Renal insufficiency   . Stroke [facility])    no deficits; had stroke while trying to place "stents in legs".    Family History  Problem Relation Age of Onset  . Cancer Father     SOCIAL HISTORY: Social History   Socioeconomic History  . Marital status: Married    Spouse name: Not on file  . Number of children: Not on file  . Years of education: Not on file  . Highest education level: Not on file  Occupational History  . Not on file  Tobacco Use  . Smoking status: Current Every Day Smoker    Packs/day: 2.00    Years: 50.00    Pack years: 100.00    Types: Cigarettes    Start date: 08/07/1957  . Smokeless tobacco: Never Used  . Tobacco comment: on chantix now  Vaping Use  . Vaping Use: Never used  Substance and Sexual Activity  . Alcohol use: No  . Drug use: No  . Sexual activity: Yes  Other Topics Concern  . Not on file  Social History Narrative  . Not on file   Social Determinants of Health   Financial Resource Strain:   . Difficulty of Paying Living Expenses: Not on file  Food Insecurity:   . Worried About Charity fundraiser in the Last Year: Not on file  . Ran Out of Food in the Last Year: Not on file  Transportation Needs:   . Lack of Transportation (Medical): Not on file  . Lack of Transportation (Non-Medical): Not on  file  Physical Activity:   . Days of Exercise per Week: Not on file  . Minutes of Exercise per Session: Not on file  Stress:   . Feeling of Stress : Not on file  Social Connections:   . Frequency of Communication with Friends and Family: Not on file  . Frequency of Social Gatherings with Friends and Family: Not on file  . Attends Religious Services: Not on file  . Active Member of Clubs or Organizations: Not on file  . Attends Archivist Meetings: Not on file  . Marital Status: Not on file  Intimate Partner Violence:   . Fear of Current or Ex-Partner: Not on file  . Emotionally Abused: Not on file  . Physically Abused: Not on file  . Sexually Abused: Not on file    No Known Allergies  Current Outpatient Medications  Medication Sig Dispense Refill  . atorvastatin (LIPITOR) 80 MG tablet Take 80 mg by mouth daily.    . carvedilol (COREG) 12.5 MG tablet Take 6.25 mg by mouth 2 (two) times daily.    . clopidogrel (PLAVIX) 75 MG tablet Take 75 mg by mouth daily.     . furosemide (LASIX) 40 MG tablet Take 40 mg by  mouth daily.     Marland Kitchen losartan (COZAAR) 50 MG tablet Take 1 tablet (50 mg total) by mouth daily. 30 tablet 6  . rosuvastatin (CRESTOR) 20 MG tablet Take 20 mg by mouth daily.     . tamsulosin (FLOMAX) 0.4 MG CAPS capsule Take 1 capsule (0.4 mg total) by mouth daily after supper. 90 capsule 3  . traMADol (ULTRAM) 50 MG tablet Take by mouth every 6 (six) hours as needed.    . traZODone (DESYREL) 50 MG tablet TAKE 1 TABLET (50 MG TOTAL) BY MOUTH EVERY NIGHT    . Vitamin D, Ergocalciferol, (DRISDOL) 1.25 MG (50000 UNIT) CAPS capsule Take 50,000 Units by mouth once a week.    . zolpidem (AMBIEN) 10 MG tablet Take 10 mg by mouth at bedtime.     No current facility-administered medications for this visit.    REVIEW OF SYSTEMS:  [X]  denotes positive finding, [ ]  denotes negative finding Cardiac  Comments:  Chest pain or chest pressure:    Shortness of breath upon  exertion:    Short of breath when lying flat:    Irregular heart rhythm:        Vascular    Pain in calf, thigh, or hip brought on by ambulation:    Pain in feet at night that wakes you up from your sleep:     Blood clot in your veins:    Leg swelling:         Pulmonary    Oxygen at home:    Productive cough:     Wheezing:         Neurologic    Sudden weakness in arms or legs:     Sudden numbness in arms or legs:     Sudden onset of difficulty speaking or slurred speech:    Temporary loss of vision in one eye:     Problems with dizziness:         Gastrointestinal    Blood in stool:     Vomited blood:         Genitourinary    Burning when urinating:     Blood in urine:        Psychiatric    Major depression:         Hematologic    Bleeding problems:    Problems with blood clotting too easily:        Skin    Rashes or ulcers:        Constitutional    Fever or chills:      PHYSICAL EXAM: Vitals:   11/28/19 1130  BP: (!) 147/80  Pulse: (!) 52  Resp: 14  Temp: 97.7 F (36.5 C)  TempSrc: Other (Comment)  SpO2: 96%  Weight: 199 lb (90.3 kg)  Height: 6\' 2"  (1.88 m)    GENERAL: The patient is a well-nourished male, in no acute distress. The vital signs are documented above. CARDIAC: There is a regular rate and rhythm.  VASCULAR: 2+ radial pulses bilaterally.  Moderate cephalic vein size at both wrists and left forearm. PULMONARY: There is good air exchange bilaterally without wheezing or rales. ABDOMEN: Soft and non-tender with normal pitched bowel sounds.  MUSCULOSKELETAL: There are no major deformities or cyanosis. NEUROLOGIC: No focal weakness or paresthesias are detected. SKIN: There are no ulcers or rashes noted. PSYCHIATRIC: The patient has a normal affect.  DATA:   He did not have a formal venous duplex prior to seeing me.  I did image his  left arm veins with SonoSite ultrasound.  This reveals good caliber cephalic vein throughout his left forearm  and left upper arm.  He does have branching above the wrist with 2 moderate sized arteries and then becoming larger above the branch.  MEDICAL ISSUES:  Had a long discussion with the patient and his wife.  Discussed options for catheter for acute hemodialysis and also AV graft and AV fistula.  Discussed the advantages and disadvantages of each of these.  He does appear to be a candidate for a left arm AV fistula since he is right arm dominant.  Does appear that he would be a candidate for a radiocephalic fistula.  He does have some branching but does appear to have adequate size for left arm AV fistula.  He reports that he does have an appointment to see Dr.Bhutani in December.  He feels that his renal function has been fluctuating and he wishes to defer placement of his access until his December visit.  We are certainly available for access placement at any time at [facility].  I explained that he does not need to necessarily see me again in the office unless he wishes to.  We can simply schedule left arm access when he is agreeable   Curt Jews [facility] [place] phone [phone]

## 2019-12-05 DIAGNOSIS — N184 Chronic kidney disease, stage 4 (severe): Secondary | ICD-10-CM | POA: Diagnosis not present

## 2019-12-05 DIAGNOSIS — E782 Mixed hyperlipidemia: Secondary | ICD-10-CM | POA: Diagnosis not present

## 2019-12-05 DIAGNOSIS — I129 Hypertensive chronic kidney disease with stage 1 through stage 4 chronic kidney disease, or unspecified chronic kidney disease: Secondary | ICD-10-CM | POA: Diagnosis not present

## 2020-01-03 DIAGNOSIS — I10 Essential (primary) hypertension: Secondary | ICD-10-CM | POA: Diagnosis not present

## 2020-01-03 DIAGNOSIS — I129 Hypertensive chronic kidney disease with stage 1 through stage 4 chronic kidney disease, or unspecified chronic kidney disease: Secondary | ICD-10-CM | POA: Diagnosis not present

## 2020-01-03 DIAGNOSIS — E785 Hyperlipidemia, unspecified: Secondary | ICD-10-CM | POA: Diagnosis not present

## 2020-01-03 DIAGNOSIS — K219 Gastro-esophageal reflux disease without esophagitis: Secondary | ICD-10-CM | POA: Diagnosis not present

## 2020-01-03 DIAGNOSIS — N184 Chronic kidney disease, stage 4 (severe): Secondary | ICD-10-CM | POA: Diagnosis not present

## 2020-01-12 DIAGNOSIS — I129 Hypertensive chronic kidney disease with stage 1 through stage 4 chronic kidney disease, or unspecified chronic kidney disease: Secondary | ICD-10-CM | POA: Diagnosis not present

## 2020-01-12 DIAGNOSIS — Z0000 Encounter for general adult medical examination without abnormal findings: Secondary | ICD-10-CM | POA: Diagnosis not present

## 2020-01-12 DIAGNOSIS — E211 Secondary hyperparathyroidism, not elsewhere classified: Secondary | ICD-10-CM | POA: Diagnosis not present

## 2020-01-12 DIAGNOSIS — H9193 Unspecified hearing loss, bilateral: Secondary | ICD-10-CM | POA: Diagnosis not present

## 2020-01-12 DIAGNOSIS — D692 Other nonthrombocytopenic purpura: Secondary | ICD-10-CM | POA: Diagnosis not present

## 2020-01-12 DIAGNOSIS — N185 Chronic kidney disease, stage 5: Secondary | ICD-10-CM | POA: Diagnosis not present

## 2020-01-12 DIAGNOSIS — I5032 Chronic diastolic (congestive) heart failure: Secondary | ICD-10-CM | POA: Diagnosis not present

## 2020-01-12 DIAGNOSIS — N189 Chronic kidney disease, unspecified: Secondary | ICD-10-CM | POA: Diagnosis not present

## 2020-01-18 DIAGNOSIS — I10 Essential (primary) hypertension: Secondary | ICD-10-CM | POA: Diagnosis not present

## 2020-01-18 DIAGNOSIS — E782 Mixed hyperlipidemia: Secondary | ICD-10-CM | POA: Diagnosis not present

## 2020-01-18 DIAGNOSIS — I5032 Chronic diastolic (congestive) heart failure: Secondary | ICD-10-CM | POA: Diagnosis not present

## 2020-01-18 DIAGNOSIS — N185 Chronic kidney disease, stage 5: Secondary | ICD-10-CM | POA: Diagnosis not present

## 2020-01-18 DIAGNOSIS — E211 Secondary hyperparathyroidism, not elsewhere classified: Secondary | ICD-10-CM | POA: Diagnosis not present

## 2020-01-18 DIAGNOSIS — N184 Chronic kidney disease, stage 4 (severe): Secondary | ICD-10-CM | POA: Diagnosis not present

## 2020-01-18 DIAGNOSIS — I129 Hypertensive chronic kidney disease with stage 1 through stage 4 chronic kidney disease, or unspecified chronic kidney disease: Secondary | ICD-10-CM | POA: Diagnosis not present

## 2020-01-18 DIAGNOSIS — D631 Anemia in chronic kidney disease: Secondary | ICD-10-CM | POA: Diagnosis not present

## 2020-02-01 ENCOUNTER — Other Ambulatory Visit: Payer: Self-pay

## 2020-02-16 DIAGNOSIS — N184 Chronic kidney disease, stage 4 (severe): Secondary | ICD-10-CM | POA: Diagnosis not present

## 2020-02-16 DIAGNOSIS — D692 Other nonthrombocytopenic purpura: Secondary | ICD-10-CM | POA: Diagnosis not present

## 2020-02-16 DIAGNOSIS — Z720 Tobacco use: Secondary | ICD-10-CM | POA: Diagnosis not present

## 2020-02-16 DIAGNOSIS — N2581 Secondary hyperparathyroidism of renal origin: Secondary | ICD-10-CM | POA: Diagnosis not present

## 2020-02-16 DIAGNOSIS — R600 Localized edema: Secondary | ICD-10-CM | POA: Diagnosis not present

## 2020-02-16 DIAGNOSIS — R000 Tachycardia, unspecified: Secondary | ICD-10-CM | POA: Diagnosis not present

## 2020-02-16 DIAGNOSIS — G4700 Insomnia, unspecified: Secondary | ICD-10-CM | POA: Diagnosis not present

## 2020-02-16 DIAGNOSIS — H9193 Unspecified hearing loss, bilateral: Secondary | ICD-10-CM | POA: Diagnosis not present

## 2020-02-16 DIAGNOSIS — I10 Essential (primary) hypertension: Secondary | ICD-10-CM | POA: Diagnosis not present

## 2020-02-16 DIAGNOSIS — E782 Mixed hyperlipidemia: Secondary | ICD-10-CM | POA: Diagnosis not present

## 2020-02-20 NOTE — Patient Instructions (Signed)
Your procedure is scheduled on: 02/23/2020  Report to [facility] at   6:15  AM.  Call this number if you have problems the morning of surgery: [phone]   Remember:   Do not Eat or Drink after midnight         No Smoking the morning of surgery  :  Take these medicines the morning of surgery with A SIP OF WATER: Carvedilol, flomax, and tramadol if needed   Do not wear jewelry, make-up or nail polish.  Do not wear lotions, powders, or perfumes. You may wear deodorant.  Do not shave 48 hours prior to surgery. Men may shave face and neck.  Do not bring valuables to the hospital.  Contacts, dentures or bridgework may not be worn into surgery.  Leave suitcase in the car. After surgery it may be brought to your room.  For patients admitted to the hospital, checkout time is 11:00 AM the day of discharge.   Patients discharged the day of surgery will not be allowed to drive home.    Special Instructions: Shower using CHG night before surgery and shower the day of surgery use CHG.  Use special wash - you have one bottle of CHG for all showers.  You should use approximately 1/2 of the bottle for each shower.  How to Use Chlorhexidine for Bathing Chlorhexidine gluconate (CHG) is a germ-killing (antiseptic) solution that is used to clean the skin. It can get rid of the bacteria that normally live on the skin and can keep them away for about 24 hours. To clean your skin with CHG, you may be given:  A CHG solution to use in the shower or as part of a sponge bath.  A prepackaged cloth that contains CHG. Cleaning your skin with CHG may help lower the risk for infection:  While you are staying in the intensive care unit of the hospital.  If you have a vascular access, such as a central line, to provide short-term or long-term access to your veins.  If you have a catheter to drain urine from your bladder.  If you are on a ventilator. A ventilator is a machine that helps you breathe by moving  air in and out of your lungs.  After surgery. What are the risks? Risks of using CHG include:  A skin reaction.  Hearing loss, if CHG gets in your ears.  Eye injury, if CHG gets in your eyes and is not rinsed out.  The CHG product catching fire. Make sure that you avoid smoking and flames after applying CHG to your skin. Do not use CHG:  If you have a chlorhexidine allergy or have previously reacted to chlorhexidine.  On babies younger than 35 months of age. How to use CHG solution  Use CHG only as told by your health care provider, and follow the instructions on the label.  Use the full amount of CHG as directed. Usually, this is one bottle. During a shower Follow these steps when using CHG solution during a shower (unless your health care provider gives you different instructions): 1. Start the shower. 2. Use your normal soap and shampoo to wash your face and hair. 3. Turn off the shower or move out of the shower stream. 4. Pour the CHG onto a clean washcloth. Do not use any type of brush or rough-edged sponge. 5. Starting at your neck, lather your body down to your toes. Make sure you follow these instructions: ? If you will be  having surgery, pay special attention to the part of your body where you will be having surgery. Scrub this area for at least 1 minute. ? Do not use CHG on your head or face. If the solution gets into your ears or eyes, rinse them well with water. ? Avoid your genital area. ? Avoid any areas of skin that have broken skin, cuts, or scrapes. ? Scrub your back and under your arms. Make sure to wash skin folds. 6. Let the lather sit on your skin for 1-2 minutes or as long as told by your health care provider. 7. Thoroughly rinse your entire body in the shower. Make sure that all body creases and crevices are rinsed well. 8. Dry off with a clean towel. Do not put any substances on your body afterward--such as powder, lotion, or perfume--unless you are told  to do so by your health care provider. Only use lotions that are recommended by the manufacturer. 9. Put on clean clothes or pajamas. 10. If it is the night before your surgery, sleep in clean sheets.   During a sponge bath Follow these steps when using CHG solution during a sponge bath (unless your health care provider gives you different instructions): 1. Use your normal soap and shampoo to wash your face and hair. 2. Pour the CHG onto a clean washcloth. 3. Starting at your neck, lather your body down to your toes. Make sure you follow these instructions: ? If you will be having surgery, pay special attention to the part of your body where you will be having surgery. Scrub this area for at least 1 minute. ? Do not use CHG on your head or face. If the solution gets into your ears or eyes, rinse them well with water. ? Avoid your genital area. ? Avoid any areas of skin that have broken skin, cuts, or scrapes. ? Scrub your back and under your arms. Make sure to wash skin folds. 4. Let the lather sit on your skin for 1-2 minutes or as long as told by your health care provider. 5. Using a different clean, wet washcloth, thoroughly rinse your entire body. Make sure that all body creases and crevices are rinsed well. 6. Dry off with a clean towel. Do not put any substances on your body afterward--such as powder, lotion, or perfume--unless you are told to do so by your health care provider. Only use lotions that are recommended by the manufacturer. 7. Put on clean clothes or pajamas. 8. If it is the night before your surgery, sleep in clean sheets. How to use CHG prepackaged cloths  Only use CHG cloths as told by your health care provider, and follow the instructions on the label.  Use the CHG cloth on clean, dry skin.  Do not use the CHG cloth on your head or face unless your health care provider tells you to.  When washing with the CHG cloth: ? Avoid your genital area. ? Avoid any areas of  skin that have broken skin, cuts, or scrapes. Before surgery Follow these steps when using a CHG cloth to clean before surgery (unless your health care provider gives you different instructions): 1. Using the CHG cloth, vigorously scrub the part of your body where you will be having surgery. Scrub using a back-and-forth motion for 3 minutes. The area on your body should be completely wet with CHG when you are done scrubbing. 2. Do not rinse. Discard the cloth and let the area air-dry. Do not put  any substances on the area afterward, such as powder, lotion, or perfume. 3. Put on clean clothes or pajamas. 4. If it is the night before your surgery, sleep in clean sheets.   For general bathing Follow these steps when using CHG cloths for general bathing (unless your health care provider gives you different instructions). 1. Use a separate CHG cloth for each area of your body. Make sure you wash between any folds of skin and between your fingers and toes. Wash your body in the following order, switching to a new cloth after each step: ? The front of your neck, shoulders, and chest. ? Both of your arms, under your arms, and your hands. ? Your stomach and groin area, avoiding the genitals. ? Your right leg and foot. ? Your left leg and foot. ? The back of your neck, your back, and your buttocks. 2. Do not rinse. Discard the cloth and let the area air-dry. Do not put any substances on your body afterward--such as powder, lotion, or perfume--unless you are told to do so by your health care provider. Only use lotions that are recommended by the manufacturer. 3. Put on clean clothes or pajamas. Contact a health care provider if:  Your skin gets irritated after scrubbing.  You have questions about using your solution or cloth. Get help right away if:  Your eyes become very red or swollen.  Your eyes itch badly.  Your skin itches badly and is red or swollen.  Your hearing changes.  You have  trouble seeing.  You have swelling or tingling in your mouth or throat.  You have trouble breathing.  You swallow any chlorhexidine. Summary  Chlorhexidine gluconate (CHG) is a germ-killing (antiseptic) solution that is used to clean the skin. Cleaning your skin with CHG may help to lower your risk for infection.  You may be given CHG to use for bathing. It may be in a bottle or in a prepackaged cloth to use on your skin. Carefully follow your health care provider's instructions and the instructions on the product label.  Do not use CHG if you have a chlorhexidine allergy.  Contact your health care provider if your skin gets irritated after scrubbing. This information is not intended to replace advice given to you by your health care provider. Make sure you discuss any questions you have with your health care provider. Document Revised: 07/15/2019 Document Reviewed: 07/15/2019 Elsevier Patient Education  2021 [place].  AV Fistula Placement, Care After The following information offers guidance on how to care for yourself after your procedure. Your health care provider may also give you more specific instructions. If you have problems or questions, contact your health care provider. What can I expect after the procedure? After the procedure, it is common to have:  Soreness at the fistula site.  Vibration (thrill) over the fistula. Follow these instructions at home: Medicines  Take over-the-counter and prescription medicines only as told by your health care provider.  Ask your health care provider if the medicine prescribed to you can cause constipation. You may need to take these actions to prevent or treat constipation: ? Drink enough fluid to keep your urine pale yellow. ? Take over-the-counter or prescription medicines. ? Eat foods that are high in fiber, such as beans, whole grains, and fresh fruits and vegetables. ? Limit foods that are high in fat and processed sugars,  such as fried or sweet foods. Incision care Follow instructions from your health care provider about how  to take care of your incision. Make sure you:  Wash your hands with soap and water for at least 20 seconds before and after you change your bandage (dressing). If soap and water are not available, use hand sanitizer.  Change your dressing as told by your health care provider.  Leave stitches (sutures), skin glue, or adhesive strips in place. These skin closures may need to stay in place for 2 weeks or longer. If adhesive strip edges start to loosen and curl up, you may trim the loose edges. Do not remove adhesive strips completely unless your health care provider tells you to do that.   Fistula care  Check your fistula site every day to make sure the thrill feels the same.  Check your fistula site every day for signs of infection. Check for: ? More redness, swelling, or pain. ? Fluid or blood. ? Warmth. ? Pus or a bad smell.  Raise (elevate) the affected area above the level of your heart while you are sitting or lying down.  Do not lift anything that is heavier than 10 lb (4.5 kg), or the limit that you are told, until your health care provider says that it is safe.  Do not lie down on your fistula arm.  Do not let anyone draw blood or take a blood pressure reading on your fistula arm. This is important.  Do not wear tight jewelry or clothing over your fistula arm.   Bathing  Do not take baths, swim, or use a hot tub until your health care provider approves. Ask your health care provider if you may take showers. You may only be allowed to take sponge baths.  Keep the area around your incision clean and dry. General instructions  Rest at home for a day or two.  If you were given a sedative during the procedure, it can affect you for several hours. Do not drive or operate machinery until your health care provider says that it is safe.  Return to your normal activities as  told. Ask your health care provider what activities are safe for you.  Keep all follow-up visits. This is important. Contact a health care provider if:  You have more redness, swelling, or pain around your fistula site.  Your fistula site feels warm to the touch.  You have pus or a bad smell coming from your fistula site.  You have a fever or chills.  You feel numb or cold in your arm or your fistula site.  You feel a decrease or a change in the thrill over the fistula. Get help right away if:  You have bleeding from your fistula site that will not stop.  You have chest pain.  You have trouble breathing. These symptoms may represent a serious problem that is an emergency. Do not wait to see if the symptoms will go away. Get medical help right away. Call your local emergency services (911 in the [place] Do not drive yourself to the hospital. Summary  Follow instructions from your health care provider about how to take care of your incision.  Do not let anyone draw blood or take a blood pressure reading on your fistula arm. This is important.  Contact a health care provider if you have a change in the thrill or have any signs of infection at your fistula site.  Keep all follow-up visits. This is important. This information is not intended to replace advice given to you by your health care provider. Make sure  you discuss any questions you have with your health care provider. Document Revised: 09/07/2019 Document Reviewed: 09/07/2019 Elsevier Patient Education  2021 [place] After This sheet gives you information about how to care for yourself after your procedure. Your health care provider may also give you more specific instructions. If you have problems or questions, contact your health care provider. What can I expect after the procedure? After the procedure, it is common to have:  Tiredness.  Forgetfulness about what happened after  the procedure.  Impaired judgment for important decisions.  Nausea or vomiting.  Some difficulty with balance. Follow these instructions at home: For the time period you were told by your health care provider:  Rest as needed.  Do not participate in activities where you could fall or become injured.  Do not drive or use machinery.  Do not drink alcohol.  Do not take sleeping pills or medicines that cause drowsiness.  Do not make important decisions or sign legal documents.  Do not take care of children on your own.      Eating and drinking  Follow the diet that is recommended by your health care provider.  Drink enough fluid to keep your urine pale yellow.  If you vomit: ? Drink water, juice, or soup when you can drink without vomiting. ? Make sure you have little or no nausea before eating solid foods. General instructions  Have a responsible adult stay with you for the time you are told. It is important to have someone help care for you until you are awake and alert.  Take over-the-counter and prescription medicines only as told by your health care provider.  If you have sleep apnea, surgery and certain medicines can increase your risk for breathing problems. Follow instructions from your health care provider about wearing your sleep device: ? Anytime you are sleeping, including during daytime naps. ? While taking prescription pain medicines, sleeping medicines, or medicines that make you drowsy.  Avoid smoking.  Keep all follow-up visits as told by your health care provider. This is important. Contact a health care provider if:  You keep feeling nauseous or you keep vomiting.  You feel light-headed.  You are still sleepy or having trouble with balance after 24 hours.  You develop a rash.  You have a fever.  You have redness or swelling around the IV site. Get help right away if:  You have trouble breathing.  You have new-onset confusion at  home. Summary  For several hours after your procedure, you may feel tired. You may also be forgetful and have poor judgment.  Have a responsible adult stay with you for the time you are told. It is important to have someone help care for you until you are awake and alert.  Rest as told. Do not drive or operate machinery. Do not drink alcohol or take sleeping pills.  Get help right away if you have trouble breathing, or if you suddenly become confused. This information is not intended to replace advice given to you by your health care provider. Make sure you discuss any questions you have with your health care provider. Document Revised: 10/13/2019 Document Reviewed: 12/30/2018 Elsevier Patient Education  2021 [employer].

## 2020-02-21 ENCOUNTER — Telehealth: Payer: Self-pay

## 2020-02-21 ENCOUNTER — Encounter (HOSPITAL_COMMUNITY)
Admission: RE | Admit: 2020-02-21 | Discharge: 2020-02-21 | Disposition: A | Discharge status: Home / Self Care | Payer: Medicare Other | Source: Ambulatory Visit | Attending: Vascular Surgery | Admitting: Vascular Surgery

## 2020-02-21 ENCOUNTER — Other Ambulatory Visit (HOSPITAL_COMMUNITY): Admission: RE | Admit: 2020-02-21 | Payer: Medicare Other | Source: Ambulatory Visit

## 2020-02-21 ENCOUNTER — Encounter (HOSPITAL_COMMUNITY): Payer: Medicare Other

## 2020-02-21 ENCOUNTER — Encounter (HOSPITAL_COMMUNITY): Payer: Self-pay

## 2020-02-21 NOTE — Telephone Encounter (Signed)
Received a call from Maudie Mercury, nurse at [facility] reporting pt no showed for covid test and pre-admission testing for upcoming surgery on 02/23/20.  Spoke with pt's wife who stated she called last week to cancel surgery appt due to rise in covid cases. Stated she will call back when pt ready to reschedule.

## 2020-02-21 NOTE — Pre-Procedure Instructions (Signed)
Left message on Dr Luther Parody schedulers answering machine that patient was a no show foe his preop.

## 2020-02-23 ENCOUNTER — Encounter (HOSPITAL_COMMUNITY): Admission: RE | Payer: Self-pay | Source: Home / Self Care

## 2020-02-23 ENCOUNTER — Ambulatory Visit (HOSPITAL_COMMUNITY): Admission: RE | Admit: 2020-02-23 | Payer: Medicare Other | Source: Home / Self Care | Admitting: Vascular Surgery

## 2020-02-23 SURGERY — ARTERIOVENOUS (AV) FISTULA CREATION
Anesthesia: Choice | Laterality: Left

## 2020-03-10 DIAGNOSIS — N184 Chronic kidney disease, stage 4 (severe): Secondary | ICD-10-CM | POA: Diagnosis not present

## 2020-03-10 DIAGNOSIS — R000 Tachycardia, unspecified: Secondary | ICD-10-CM | POA: Diagnosis not present

## 2020-03-10 DIAGNOSIS — D692 Other nonthrombocytopenic purpura: Secondary | ICD-10-CM | POA: Diagnosis not present

## 2020-03-10 DIAGNOSIS — R600 Localized edema: Secondary | ICD-10-CM | POA: Diagnosis not present

## 2020-03-10 DIAGNOSIS — Z720 Tobacco use: Secondary | ICD-10-CM | POA: Diagnosis not present

## 2020-03-10 DIAGNOSIS — E782 Mixed hyperlipidemia: Secondary | ICD-10-CM | POA: Diagnosis not present

## 2020-03-10 DIAGNOSIS — G4700 Insomnia, unspecified: Secondary | ICD-10-CM | POA: Diagnosis not present

## 2020-03-10 DIAGNOSIS — I10 Essential (primary) hypertension: Secondary | ICD-10-CM | POA: Diagnosis not present

## 2020-03-10 DIAGNOSIS — H9193 Unspecified hearing loss, bilateral: Secondary | ICD-10-CM | POA: Diagnosis not present

## 2020-03-10 DIAGNOSIS — N2581 Secondary hyperparathyroidism of renal origin: Secondary | ICD-10-CM | POA: Diagnosis not present

## 2020-03-22 DIAGNOSIS — I10 Essential (primary) hypertension: Secondary | ICD-10-CM | POA: Diagnosis not present

## 2020-03-22 DIAGNOSIS — G4452 New daily persistent headache (NDPH): Secondary | ICD-10-CM | POA: Diagnosis not present

## 2020-03-22 DIAGNOSIS — Z79899 Other long term (current) drug therapy: Secondary | ICD-10-CM | POA: Diagnosis not present

## 2020-03-26 ENCOUNTER — Other Ambulatory Visit: Payer: Medicare Other

## 2020-03-26 ENCOUNTER — Other Ambulatory Visit: Payer: Self-pay

## 2020-03-26 DIAGNOSIS — C61 Malignant neoplasm of prostate: Secondary | ICD-10-CM

## 2020-03-27 ENCOUNTER — Encounter: Payer: Self-pay | Admitting: Urology

## 2020-03-27 ENCOUNTER — Ambulatory Visit (INDEPENDENT_AMBULATORY_CARE_PROVIDER_SITE_OTHER): Payer: Medicare Other | Admitting: Urology

## 2020-03-27 VITALS — BP 180/74 | HR 84 | Temp 99.1°F | Ht 74.0 in | Wt 203.0 lb

## 2020-03-27 DIAGNOSIS — N3001 Acute cystitis with hematuria: Secondary | ICD-10-CM | POA: Diagnosis not present

## 2020-03-27 DIAGNOSIS — C61 Malignant neoplasm of prostate: Secondary | ICD-10-CM

## 2020-03-27 DIAGNOSIS — R310 Gross hematuria: Secondary | ICD-10-CM | POA: Diagnosis not present

## 2020-03-27 LAB — MICROSCOPIC EXAMINATION
RBC, Urine: >30 /hpf — AB (ref 0–2)
Renal Epithel, UA: NONE SEEN /hpf
WBC, UA: >30 /hpf — AB (ref 0–5)

## 2020-03-27 LAB — URINALYSIS, ROUTINE W REFLEX MICROSCOPIC
Bilirubin, UA: NEGATIVE
Glucose, UA: NEGATIVE
Ketones, UA: NEGATIVE
Nitrite, UA: NEGATIVE
Specific Gravity, UA: 1.02 (ref 1.005–1.030)
Urobilinogen, Ur: 0.2 mg/dL (ref 0.2–1.0)
pH, UA: 5.5 (ref 5.0–7.5)

## 2020-03-27 LAB — PSA: Prostate Specific Ag, Serum: 0.2 ng/mL (ref 0.0–4.0)

## 2020-03-27 LAB — BLADDER SCAN AMB NON-IMAGING: Scan Result: 24

## 2020-03-27 LAB — TESTOSTERONE: Testosterone: 124 ng/dL — ABNORMAL LOW (ref 264–916)

## 2020-03-27 MED ORDER — CEPHALEXIN 250 MG PO CAPS: 250.0000 mg | ORAL_CAPSULE | Freq: Two times a day (BID) | ORAL | 0 refills | Status: DC

## 2020-03-27 NOTE — Progress Notes (Signed)
H&P  Chief Complaint: PCa  History of Present Illness:  Ronald [name] is a 80 year-old male established patient who is here evaluation for treatment of prostate cancer.  His prostate cancer was diagnosed 07/14/2017. He does have the pathology report from his biopsy. His most recent PSA is 21.3.   He has undergone External Beam Radiation Therapy for treatment. He has undergone Hormonal Therapy for treatment.   He does not have urinary incontinence. He has not recently had unwanted weight loss. He is not having pain in new locations.   ~ 20 years ago in [place] had what sounds like a TURP. He is not sure if PCa was diagnosed at that time, but no real treatments were needed in the few years he followed up after that. PSA in 2016 was around 8, recently 21.3 in March, 2019.  6.4.2019: TRUS/Bx --volume 61 ml. 11/12 cores positive;  7 with GS 4+3 pattern, 4 with GS 3+4 pattern.  CT (7.26)/bone scan(6.28) negative for metastatic disease.   11.5.2019: He was initiated on ADT with Firmagon 240 mg. Fiducial markers were placed in advance of EBRT by Dr Harlen Labs.   12.17.2019: Here for 4 mo Lupron. PSA 1.2. T < 10.   4.28.2020: He completed EBRT by Dr Francesca Jewett  (Prostate + SV + Pelvic LNs IMRT 6 MV 180 cGy 25 4500 cGy 01.07.2020-  02.13.2020 Prostate + SV IMRT 6 MV 180 cGy 19 3420 cGy 02.14.2020-03.13.2020). PSA 0.1, T<10.   9.1.2020: Worsened LUTS. Only on 1 flomax/day. Does c/o low energy. No blood in urine or stool. Most recent PSA 0.1, testosterone level less than 10.   4.6.2021: Here today for follow-up. Most recent PSA undetectable with T levels remaining castrate. He remains on tamsulosin 1x daily. He reports today that he is no longer having issues with urination and is no longer considering surgical management.  8.17.2021: PSA <0.01 Testosterone: 140.   Pt denies any gross hematuria or blood in stool. Pt reports that he is no longer experiencing hot flashes. Pt submits no  prostate related concerns or complaints at this time. Pt is concerned about his diminishing kidney function.  2.15.2022: PSA 0.2 Testosterone: 120 Ronald [name] is here for f/u of his urinary symptoms. On Sunday (2.13.2022) he experienced an episode of gross hematuria w/ clots. His symptoms were accompanied by pain, frequency, and urgency, but no foul smell. His gross hematuria resolved after approximately 1 day and he reports that prior to this episode, his urinary symptoms were minimal. He feels that he is able to empty his bladder completely. His PSA remains low, but his testosterone is recovering.  IPSS Questionnaire (AUA-7): Over the past month.   1)  How often have you had a sensation of not emptying your bladder completely after you finish urinating?  5 - Almost always  2)  How often have you had to urinate again less than two hours after you finished urinating? 5 - Almost always  3)  How often have you found you stopped and started again several times when you urinated?  5 - Almost always  4) How difficult have you found it to postpone urination?  5 - Almost always  5) How often have you had a weak urinary stream?  5 - Almost always  6) How often have you had to push or strain to begin urination?  5 - Almost always  7) How many times did you most typically get up to urinate from the time you went to bed until the  time you got up in the morning?  5 - 5+ times  Total score:  0-7 mildly symptomatic   8-19 moderately symptomatic   20-35 severely symptomatic  QoL Score: 6   Past Medical History:  Diagnosis Date  . GERD (gastroesophageal reflux disease)   . Hypertension   . Myocardial infarction ([place])    1999  . Renal insufficiency   . Stroke [facility])    no deficits; had stroke while trying to place "stents in legs".    Past Surgical History:  Procedure Laterality Date  . ABDOMINAL AORTIC ANEURYSM REPAIR  2012   IN [place]  . APPENDECTOMY    . CORONARY ARTERY BYPASS GRAFT     3  vessels 18 years ago  . INGUINAL HERNIA REPAIR Right 09/20/2015   Procedure: REPAIR RIGHT INGUINAL HERNIA  WITH MESH;  Surgeon: Vickie Epley, MD;  Location: [facility];  Service: General;  Laterality: Right;  . tumor removal from bladder      Home Medications:  Allergies as of 03/27/2020   No Known Allergies     Medication List       Accurate as of March 27, 2020  9:01 AM. If you have any questions, ask your nurse or doctor.        atorvastatin 80 MG tablet Commonly known as: LIPITOR Take 80 mg by mouth daily.   carvedilol 12.5 MG tablet Commonly known as: COREG Take 6.25 mg by mouth 2 (two) times daily.   clopidogrel 75 MG tablet Commonly known as: PLAVIX Take 75 mg by mouth daily.   furosemide 40 MG tablet Commonly known as: LASIX Take 40 mg by mouth daily.   losartan 50 MG tablet Commonly known as: COZAAR Take 1 tablet (50 mg total) by mouth daily.   rosuvastatin 20 MG tablet Commonly known as: CRESTOR Take 20 mg by mouth daily.   tamsulosin 0.4 MG Caps capsule Commonly known as: FLOMAX Take 1 capsule (0.4 mg total) by mouth daily after supper.   traMADol 50 MG tablet Commonly known as: ULTRAM Take by mouth every 6 (six) hours as needed.   traZODone 50 MG tablet Commonly known as: DESYREL TAKE 1 TABLET (50 MG TOTAL) BY MOUTH EVERY NIGHT   Vitamin D (Ergocalciferol) 1.25 MG (50000 UNIT) Caps capsule Commonly known as: DRISDOL Take 50,000 Units by mouth once a week.   zolpidem 10 MG tablet Commonly known as: AMBIEN Take 10 mg by mouth at bedtime.       Allergies: No Known Allergies  Family History  Problem Relation Age of Onset  . Cancer Father     Social History:  reports that he has been smoking cigarettes. He started smoking about 62 years ago. He has a 100.00 pack-year smoking history. He has never used smokeless tobacco. He reports that he does not drink alcohol and does not use drugs.  ROS: A complete review of systems was performed.   All systems are negative except for pertinent findings as noted.  Physical Exam:  Vital signs in last 24 hours: There were no vitals taken for this visit. Constitutional:  Alert and oriented, No acute distress Cardiovascular: Regular rate  Neurologic: Grossly intact, no focal deficits Psychiatric: Normal mood and affect  I have reviewed prior pt notes  I have reviewed notes from referring/previous physicians  I have reviewed urinalysis results  I have reviewed prior PSA results  PVR 27 mL      Impression/Assessment:  Cystitis (w/ hematuria) - Pt UA  reviewed and symptomatic bacterial presence noted.  PCA--s/p EBRT/ADT w/ adequate PSA response  Plan:  1. Pt urine sent for culture. He was started on cephalexin (7 day regimen). Regimen will be adjusted appropriately pending culture results.  2. F/U in 1 month for OV and symptom recheck.  CC: Dr. Allyn Kenner

## 2020-03-27 NOTE — Progress Notes (Signed)
Urological Symptom Review  Patient is experiencing the following symptoms: none   Review of Systems  Gastrointestinal (upper)  : Negative for upper GI symptoms  Gastrointestinal (lower) : Negative for lower GI symptoms  Constitutional : Negative for symptoms  Skin: Itching  Eyes: Negative for eye symptoms  Ear/Nose/Throat : Negative for Ear/Nose/Throat symptoms  Hematologic/Lymphatic: Negative for Hematologic/Lymphatic symptoms  Cardiovascular : Negative for cardiovascular symptoms  Respiratory : Negative for respiratory symptoms  Endocrine: Negative for endocrine symptoms  Musculoskeletal: Negative for musculoskeletal symptoms  Neurological: Negative for neurological symptoms  Psychologic: Negative for psychiatric symptoms

## 2020-03-30 LAB — URINE CULTURE

## 2020-03-30 NOTE — Progress Notes (Signed)
Letter sent via mail 

## 2020-04-03 ENCOUNTER — Telehealth: Payer: Self-pay

## 2020-04-03 NOTE — Telephone Encounter (Signed)
Spoke with pts wife and notified her urine culture positive.She states pt finished antibiotics last night. Will call if needed.

## 2020-04-03 NOTE — Telephone Encounter (Signed)
-----   Message from Franchot Gallo, MD sent at 04/02/2020  8:12 PM EST ----- Notify pt--culture + cont all of prescribed abx ----- Message ----- From: Dorisann Frames, RN Sent: 04/02/2020   8:42 AM EST To: Franchot Gallo, MD  Please review

## 2020-04-03 NOTE — Telephone Encounter (Signed)
Patient wife called and made aware.

## 2020-04-09 ENCOUNTER — Telehealth: Payer: Self-pay

## 2020-04-09 DIAGNOSIS — R600 Localized edema: Secondary | ICD-10-CM | POA: Diagnosis not present

## 2020-04-09 DIAGNOSIS — R000 Tachycardia, unspecified: Secondary | ICD-10-CM | POA: Diagnosis not present

## 2020-04-09 DIAGNOSIS — E782 Mixed hyperlipidemia: Secondary | ICD-10-CM | POA: Diagnosis not present

## 2020-04-09 DIAGNOSIS — G4700 Insomnia, unspecified: Secondary | ICD-10-CM | POA: Diagnosis not present

## 2020-04-09 DIAGNOSIS — H9193 Unspecified hearing loss, bilateral: Secondary | ICD-10-CM | POA: Diagnosis not present

## 2020-04-09 DIAGNOSIS — I10 Essential (primary) hypertension: Secondary | ICD-10-CM | POA: Diagnosis not present

## 2020-04-09 DIAGNOSIS — M545 Low back pain, unspecified: Secondary | ICD-10-CM | POA: Diagnosis not present

## 2020-04-09 DIAGNOSIS — N3001 Acute cystitis with hematuria: Secondary | ICD-10-CM

## 2020-04-09 DIAGNOSIS — N184 Chronic kidney disease, stage 4 (severe): Secondary | ICD-10-CM | POA: Diagnosis not present

## 2020-04-09 DIAGNOSIS — D692 Other nonthrombocytopenic purpura: Secondary | ICD-10-CM | POA: Diagnosis not present

## 2020-04-09 DIAGNOSIS — Z720 Tobacco use: Secondary | ICD-10-CM | POA: Diagnosis not present

## 2020-04-09 DIAGNOSIS — N2581 Secondary hyperparathyroidism of renal origin: Secondary | ICD-10-CM | POA: Diagnosis not present

## 2020-04-10 MED ORDER — NITROFURANTOIN MONOHYD MACRO 100 MG PO CAPS: 100.0000 mg | ORAL_CAPSULE | Freq: Two times a day (BID) | ORAL | 0 refills | Status: DC

## 2020-04-10 NOTE — Telephone Encounter (Signed)
Wife called this morning to check on status of treatment. Dr. Diona Fanti made aware and N.O. sent for Macrobid bid x 7 days. Wife called and made aware.

## 2020-04-12 ENCOUNTER — Telehealth: Payer: Self-pay

## 2020-04-12 NOTE — Telephone Encounter (Signed)
Pts wife called saying pt last urinated this morning and it was only a few drops. She said he is in pain. I explained our doctor had left for the day so if he was hurting a lot he needed to go to the ER. If they could wait until morning to call back about 8:30 and we would see him.

## 2020-04-13 ENCOUNTER — Encounter: Payer: Self-pay | Admitting: Urology

## 2020-04-13 ENCOUNTER — Ambulatory Visit (INDEPENDENT_AMBULATORY_CARE_PROVIDER_SITE_OTHER): Payer: Medicare Other | Admitting: Urology

## 2020-04-13 ENCOUNTER — Other Ambulatory Visit: Payer: Self-pay

## 2020-04-13 ENCOUNTER — Telehealth: Payer: Self-pay

## 2020-04-13 VITALS — BP 139/60 | HR 69 | Ht 72.0 in | Wt 190.0 lb

## 2020-04-13 DIAGNOSIS — N410 Acute prostatitis: Secondary | ICD-10-CM

## 2020-04-13 DIAGNOSIS — R310 Gross hematuria: Secondary | ICD-10-CM | POA: Diagnosis not present

## 2020-04-13 LAB — BLADDER SCAN AMB NON-IMAGING

## 2020-04-13 MED ORDER — CEFTRIAXONE SODIUM 1 G IJ SOLR: 1.0000 g | Freq: Once | INTRAMUSCULAR | Status: DC

## 2020-04-13 MED ORDER — CEFTRIAXONE SODIUM 500 MG IJ SOLR
1000.0000 mg | Freq: Once | INTRAMUSCULAR | Status: AC
  Administered 2020-04-13: 1000 mg via INTRAMUSCULAR

## 2020-04-13 MED ORDER — SULFAMETHOXAZOLE-TRIMETHOPRIM 800-160 MG PO TABS: 1.0000 | ORAL_TABLET | Freq: Two times a day (BID) | ORAL | 0 refills | Status: DC

## 2020-04-13 NOTE — Progress Notes (Signed)
04/13/2020 10:15 AM   Ronald [name] Jan 19, 1941 [id number]  Referring provider: Celene Squibb, MD [address] [name],  [place] [address]  Gross hematuria  HPI: [name] is a 80yo here for evaluation of difficulty urinating a gross hematuria. His hematuria started 3 weeks ago. He was evaluated on 03/27/2020 and was placed on kelfex and then macrobid. He has worsening pelvic and perineal pain. He has a weak urinary stream, urinary urgency, frequency and dribbling. He has pelvic pain with sitting. No other complaints today   PMH: Past Medical History:  Diagnosis Date  . GERD (gastroesophageal reflux disease)   . Hypertension   . Myocardial infarction ([place])    1999  . Renal insufficiency   . Stroke [facility])    no deficits; had stroke while trying to place "stents in legs".    Surgical History: Past Surgical History:  Procedure Laterality Date  . ABDOMINAL AORTIC ANEURYSM REPAIR  2012   IN [place]  . APPENDECTOMY    . CORONARY ARTERY BYPASS GRAFT     3 vessels 18 years ago  . INGUINAL HERNIA REPAIR Right 09/20/2015   Procedure: REPAIR RIGHT INGUINAL HERNIA  WITH MESH;  Surgeon: Vickie Epley, MD;  Location: [facility];  Service: General;  Laterality: Right;  . tumor removal from bladder      Home Medications:  Allergies as of 04/13/2020   No Known Allergies     Medication List       Accurate as of April 13, 2020 10:15 AM. If you have any questions, ask your nurse or doctor.        ascorbic acid 1000 MG tablet Commonly known as: VITAMIN C Take by mouth.   atorvastatin 80 MG tablet Commonly known as: LIPITOR Take 80 mg by mouth daily.   carvedilol 12.5 MG tablet Commonly known as: COREG Take 6.25 mg by mouth 2 (two) times daily.   cephALEXin 250 MG capsule Commonly known as: Keflex Take 1 capsule (250 mg total) by mouth 2 (two) times daily.   clopidogrel 75 MG tablet Commonly known as: PLAVIX Take 75 mg by mouth daily.   furosemide 40 MG  tablet Commonly known as: LASIX Take 40 mg by mouth daily.   losartan 50 MG tablet Commonly known as: COZAAR Take 1 tablet (50 mg total) by mouth daily.   nitrofurantoin (macrocrystal-monohydrate) 100 MG capsule Commonly known as: MACROBID Take 1 capsule (100 mg total) by mouth 2 (two) times daily.   rosuvastatin 20 MG tablet Commonly known as: CRESTOR Take 20 mg by mouth daily.   tamsulosin 0.4 MG Caps capsule Commonly known as: FLOMAX Take 1 capsule (0.4 mg total) by mouth daily after supper.   topiramate 25 MG tablet Commonly known as: TOPAMAX Take 25 mg by mouth 2 (two) times daily.   traMADol 50 MG tablet Commonly known as: ULTRAM Take by mouth every 6 (six) hours as needed.   traZODone 50 MG tablet Commonly known as: DESYREL TAKE 1 TABLET (50 MG TOTAL) BY MOUTH EVERY NIGHT   varenicline 1 MG tablet Commonly known as: CHANTIX   Vitamin D (Ergocalciferol) 1.25 MG (50000 UNIT) Caps capsule Commonly known as: DRISDOL Take 50,000 Units by mouth once a week.   zolpidem 10 MG tablet Commonly known as: AMBIEN Take 10 mg by mouth at bedtime.       Allergies: No Known Allergies  Family History: Family History  Problem Relation Age of Onset  . Cancer Father  Social History:  reports that he has been smoking cigarettes. He started smoking about 62 years ago. He has a 100.00 pack-year smoking history. He has never used smokeless tobacco. He reports that he does not drink alcohol and does not use drugs.  ROS: All other review of systems were reviewed and are negative except what is noted above in HPI  Physical Exam: BP 139/60   Pulse 69   Ht 6' (1.829 m)   Wt 190 lb (86.2 kg)   BMI 25.77 kg/m   Constitutional:  Alert and oriented, No acute distress. HEENT: [place] AT, moist mucus membranes.  Trachea midline, no masses. Cardiovascular: No clubbing, cyanosis, or edema. Respiratory: Normal respiratory effort, no increased work of breathing. GI: Abdomen is  soft, nontender, nondistended, no abdominal masses GU: No CVA tenderness. Circumcised phallus. No masses/lesions on penis, testis, scrotum. Prostate 30g smooth no nodules. tender  Lymph: No cervical or inguinal lymphadenopathy. Skin: No rashes, bruises or suspicious lesions. Neurologic: Grossly intact, no focal deficits, moving all 4 extremities. Psychiatric: Normal mood and affect.  Laboratory Data: Lab Results  Component Value Date   WBC 4.7 08/10/2019   HGB 12.9 (L) 08/10/2019   HCT 40.6 08/10/2019   MCV 90.4 08/10/2019   PLT 160 08/10/2019    Lab Results  Component Value Date   CREATININE 4.15 (H) 08/10/2019    Lab Results  Component Value Date   PSA <0.1 03/15/2019    Lab Results  Component Value Date   TESTOSTERONE 124 (L) 03/26/2020    No results found for: HGBA1C  Urinalysis    Component Value Date/Time   APPEARANCEUR Cloudy (A) 03/27/2020 0923   GLUCOSEU Negative 03/27/2020 0923   BILIRUBINUR Negative 03/27/2020 0923   PROTEINUR 3+ (A) 03/27/2020 0923   UROBILINOGEN 0.2 05/17/2019 0917   NITRITE Negative 03/27/2020 0923   LEUKOCYTESUR 3+ (A) 03/27/2020 0923    Lab Results  Component Value Date   LABMICR See below: 03/27/2020   WBCUA >30 (A) 03/27/2020   LABEPIT 0-10 03/27/2020   MUCUS Present 03/27/2020   BACTERIA Many (A) 03/27/2020    Pertinent Imaging:  Results for orders placed during the hospital encounter of 03/29/18  DG Abd 1 View  Narrative CLINICAL DATA:  Constipation for a few weeks.  EXAM: ABDOMEN - 1 VIEW  COMPARISON:  None.  FINDINGS: Normal bowel gas pattern without evidence of obstruction.  Mild generalized increased colonic stool burden.  No evidence of renal or ureteral stones.  Aortic stent graft extends from the lower aspect of L2 to common iliac artery arms over the upper sacrum.  Skeletal structures intact.  IMPRESSION: 1. No acute findings.  No evidence of bowel obstruction. 2. Mild generalized increased  colonic stool burden.   Electronically Signed By: Lajean Manes M.D. On: 03/30/2018 08:52  No results found for this or any previous visit.  No results found for this or any previous visit.  No results found for this or any previous visit.  Results for orders placed during the hospital encounter of 07/14/16  US Renal  Narrative CLINICAL DATA:  Protein urea, chronic renal insufficiency, assess postvoid residual volume  EXAM: RENAL / URINARY TRACT ULTRASOUND COMPLETE  COMPARISON:  Renal ultrasound of December 22, 2014  FINDINGS: Right Kidney:  Length: 12.2 cm. The renal cortical echotexture is somewhat greater than that of the adjacent liver. Multiple cysts of present 2.7 in diameter. There is no hydronephrosis.  Left Kidney:  Length: 11.3 cm. The renal cortical echotexture is  similar to that of the right kidney. There are multiple cystic structures measuring up to 1.4 cm in diameter. There is no hydronephrosis.  Bladder:  Urinary bladder exhibits diffuse wall thickening. The prostate gland produces a prominent impression upon the bladder base. The prevoid volume was 187 cc. The postvoid volume is 28 cc.  IMPRESSION: Multiple simple appearing renal cysts more conspicuous than on the previous study. Increased renal cortical echotexture consistent with medical renal disease. No hydronephrosis.  Thickened gallbladder wall.  28 cc postvoid residual volume.   Electronically Signed By: David  Martinique M.D. On: 07/14/2016 11:40  No results found for this or any previous visit.  No results found for this or any previous visit.  No results found for this or any previous visit.   Assessment & Plan:    1. Gross hematuria -likely related to acute prostatitis and cystitis - BLADDER SCAN AMB NON-IMAGING  2. Acute prostatitis -rocephin 1g today. Bactrim DS BID for 21 days   No follow-ups on file.  Nicolette Bang, MD  [facility] [place]

## 2020-04-13 NOTE — Patient Instructions (Signed)
Prostatitis  Prostatitis is swelling of the prostate gland, also called the prostate. This gland is about 1.5 inches wide and 1 inch high, and it helps to make a fluid called semen. The prostate is below a man's bladder, in front of the butt (rectum). There are different types of prostatitis. What are the causes? One type of prostatitis is caused by an infection from germs (bacteria). Another type is not caused by germs. It may be caused by:  Things having to do with the nervous system. This system includes thebrain, spinal cord, and nerves.  An autoimmune response. This happens when the body's disease-fighting system attacks healthy tissue in the body by mistake.  Psychological factors. These have to do with how the mind works. The causes of other types of prostatitis are normally not known. What are the signs or symptoms? Symptoms of this condition depend on the type of prostatitis you have. If your condition is caused by germs:  You may feel pain or burning when you pee (urinate).  You may pee often and all of a sudden.  You may have problems starting to pee.  You may have trouble emptying your bladder when you pee.  You may have fever or chills.  You may feel pain in your muscles, joints, low back, or lower belly. If you have other types of prostatitis:  You may pee often or all of a sudden.  You may have trouble starting to pee.  You may have a weak flow when you pee.  You may leak pee after using the bathroom.  You may have other problems, such as: ? Abnormal fluid coming from the penis. ? Pain in the testicles or penis. ? Pain between the butt and the testicles. ? Pain when fluid comes out of the penis during sex. How is this treated? Treatment for this condition depends on the type of prostatitis. Treatment may include:  Medicines. These may treat pain or swelling, or they may help relax muscles.  Exercises to help you move better or get stronger (physical  therapy).  Heat therapy.  Techniques to help you control some of the ways that your body works.  Exercises to help you relax.  Antibiotic medicine, if your condition is caused by germs.  Warm water baths (sitz baths) to relax muscles. Follow these instructions at home: Medicines  Take over-the-counter and prescription medicines only as told by your doctor.  If you were prescribed an antibiotic medicine, take it as told by your doctor. Do not stop using the antibiotic even if you start to feel better. Managing pain and swelling  Take sitz baths as told by your doctor. For a sitz bath, sit in warm water that is deep enough to cover your hips and butt.  If told, put heat on the painful area. Do this as often as told by your doctor. Use the heat source that your doctor recommends, such as a moist heat pack or a heating pad. ? Place a towel between your skin and the heat source. ? Leave the heat on for 20-30 minutes. ? Take off the heat if your skin turns bright red. This is very important if you are unable to feel pain, heat, or cold. You may have a greater risk of getting burned.   General instructions  Do exercises as told by your doctor, if your doctor prescribed them.  Keep all follow-up visits as told by your doctor. This is important. Where to find more information  [employer]  of Diabetes and Digestive and Kidney Diseases: http://www.bass.com/ Contact a doctor if:  Your symptoms get worse.  You have a fever. Get help right away if:  You have chills.  You feel light-headed.  You feel like you may faint.  You cannot pee.  You have blood or clumps of blood (blood clots) in your pee. Summary  Prostatitis is swelling of the prostate gland.  There are different types of prostatitis. Treatment depends on the type that you have.  Take over-the-counter and prescription medicines only as told by your doctor.  Get help right away of you have chills, feel  light-headed, or feel like you may faint. Also get help right away if you cannot pee or you have blood or clumps of blood in your pee. This information is not intended to replace advice given to you by your health care provider. Make sure you discuss any questions you have with your health care provider. Document Revised: 03/04/2019 Document Reviewed: 03/04/2019 Elsevier Patient Education  2021 [employer].

## 2020-04-13 NOTE — Progress Notes (Signed)
Urological Symptom Review  Patient is experiencing the following symptoms: Blood in urine   Review of Systems  Gastrointestinal (upper)  : Nausea  Gastrointestinal (lower) : Negative for lower GI symptoms  Constitutional : Fatigue  Skin: Negative for skin symptoms  Eyes: Negative for eye symptoms  Ear/Nose/Throat : Negative for Ear/Nose/Throat symptoms  Hematologic/Lymphatic: Negative for Hematologic/Lymphatic symptoms  Cardiovascular : Negative for cardiovascular symptoms  Respiratory : Negative for respiratory symptoms  Endocrine: Negative for endocrine symptoms  Musculoskeletal: Negative for musculoskeletal symptoms  Neurological: Headaches  Psychologic: Negative for psychiatric symptoms

## 2020-04-13 NOTE — Progress Notes (Signed)
IM Injection  Patient is present today for an IM Injection  Drug: Rocephin Dose:1GM (500mg  x 2) Location: Left and right buttocks upper outer  Lot: [id number] Exp:05/2022 Patient tolerated well, no complications were noted  Preformed by: Fonnie Jarvis, CMA

## 2020-04-16 NOTE — Telephone Encounter (Signed)
Pt was seen next day.

## 2020-04-20 ENCOUNTER — Other Ambulatory Visit: Payer: Self-pay

## 2020-04-20 ENCOUNTER — Ambulatory Visit (INDEPENDENT_AMBULATORY_CARE_PROVIDER_SITE_OTHER): Payer: Medicare Other | Admitting: Urology

## 2020-04-20 ENCOUNTER — Encounter: Payer: Self-pay | Admitting: Urology

## 2020-04-20 VITALS — BP 174/73 | HR 61 | Temp 98.3°F | Ht 72.0 in | Wt 190.0 lb

## 2020-04-20 DIAGNOSIS — N410 Acute prostatitis: Secondary | ICD-10-CM | POA: Diagnosis not present

## 2020-04-20 DIAGNOSIS — C61 Malignant neoplasm of prostate: Secondary | ICD-10-CM

## 2020-04-20 LAB — URINALYSIS, ROUTINE W REFLEX MICROSCOPIC
Bilirubin, UA: NEGATIVE
Glucose, UA: NEGATIVE
Ketones, UA: NEGATIVE
Leukocytes,UA: NEGATIVE
Nitrite, UA: NEGATIVE
Specific Gravity, UA: 1.015 (ref 1.005–1.030)
Urobilinogen, Ur: 0.2 mg/dL (ref 0.2–1.0)
pH, UA: 6.5 (ref 5.0–7.5)

## 2020-04-20 LAB — MICROSCOPIC EXAMINATION

## 2020-04-20 LAB — BLADDER SCAN AMB NON-IMAGING: Scan Result: 96

## 2020-04-20 MED ORDER — TAMSULOSIN HCL 0.4 MG PO CAPS: 0.4000 mg | ORAL_CAPSULE | Freq: Every day | ORAL | 3 refills | Status: DC

## 2020-04-20 NOTE — Progress Notes (Signed)
04/20/2020 10:11 AM   Ronald [name] October 16, 1940 [id number]  Referring provider: Celene Squibb, MD [address] Ronald [name],  [place] [address]  followup prostatitis  HPI: [name] is a 80yo here for followup for acute prostatitis. He is currently on bactrim DS BID and has 1 week of the medication left. His dysuria and pelvic pain has resolved. The urine stream is stronger. Nocturia, urinary urgency, urinary frequency resolved. Overall he is very happy with his repsonse to therapy. No complaints today.    PMH: Past Medical History:  Diagnosis Date  . GERD (gastroesophageal reflux disease)   . Hypertension   . Myocardial infarction ([place])    1999  . Renal insufficiency   . Stroke [facility])    no deficits; had stroke while trying to place "stents in legs".    Surgical History: Past Surgical History:  Procedure Laterality Date  . ABDOMINAL AORTIC ANEURYSM REPAIR  2012   IN [place]  . APPENDECTOMY    . CORONARY ARTERY BYPASS GRAFT     3 vessels 18 years ago  . INGUINAL HERNIA REPAIR Right 09/20/2015   Procedure: REPAIR RIGHT INGUINAL HERNIA  WITH MESH;  Surgeon: Vickie Epley, MD;  Location: [facility];  Service: General;  Laterality: Right;  . tumor removal from bladder      Home Medications:  Allergies as of 04/20/2020   No Known Allergies     Medication List       Accurate as of April 20, 2020 10:11 AM. If you have any questions, ask your nurse or doctor.        amLODipine 5 MG tablet Commonly known as: NORVASC Take 5 mg by mouth daily.   ascorbic acid 1000 MG tablet Commonly known as: VITAMIN C Take by mouth.   atorvastatin 80 MG tablet Commonly known as: LIPITOR Take 80 mg by mouth daily.   carvedilol 12.5 MG tablet Commonly known as: COREG Take 6.25 mg by mouth 2 (two) times daily.   cephALEXin 250 MG capsule Commonly known as: Keflex Take 1 capsule (250 mg total) by mouth 2 (two) times daily.   clopidogrel 75 MG tablet Commonly known  as: PLAVIX Take 75 mg by mouth daily.   furosemide 40 MG tablet Commonly known as: LASIX Take 40 mg by mouth daily.   losartan 50 MG tablet Commonly known as: COZAAR Take 1 tablet (50 mg total) by mouth daily.   nitrofurantoin (macrocrystal-monohydrate) 100 MG capsule Commonly known as: MACROBID Take 1 capsule (100 mg total) by mouth 2 (two) times daily.   rosuvastatin 20 MG tablet Commonly known as: CRESTOR Take 20 mg by mouth daily.   sulfamethoxazole-trimethoprim 800-160 MG tablet Commonly known as: BACTRIM DS Take 1 tablet by mouth 2 (two) times daily.   tamsulosin 0.4 MG Caps capsule Commonly known as: FLOMAX Take 1 capsule (0.4 mg total) by mouth daily after supper.   topiramate 25 MG tablet Commonly known as: TOPAMAX Take 25 mg by mouth 2 (two) times daily.   traMADol 50 MG tablet Commonly known as: ULTRAM Take by mouth every 6 (six) hours as needed.   traZODone 50 MG tablet Commonly known as: DESYREL TAKE 1 TABLET (50 MG TOTAL) BY MOUTH EVERY NIGHT   varenicline 1 MG tablet Commonly known as: CHANTIX   Vitamin D (Ergocalciferol) 1.25 MG (50000 UNIT) Caps capsule Commonly known as: DRISDOL Take 50,000 Units by mouth once a week.   zolpidem 10 MG tablet Commonly known as: AMBIEN  Take 10 mg by mouth at bedtime.       Allergies: No Known Allergies  Family History: Family History  Problem Relation Age of Onset  . Cancer Father     Social History:  reports that he has been smoking cigarettes. He started smoking about 62 years ago. He has a 100.00 pack-year smoking history. He has never used smokeless tobacco. He reports that he does not drink alcohol and does not use drugs.  ROS: All other review of systems were reviewed and are negative except what is noted above in HPI  Physical Exam: BP (!) 174/73   Pulse 61   Temp 98.3 F (36.8 C)   Ht 6' (1.829 m)   Wt 190 lb (86.2 kg)   BMI 25.77 kg/m   Constitutional:  Alert and oriented, No acute  distress. HEENT:  AT, moist mucus membranes.  Trachea midline, no masses. Cardiovascular: No clubbing, cyanosis, or edema. Respiratory: Normal respiratory effort, no increased work of breathing. GI: Abdomen is soft, nontender, nondistended, no abdominal masses GU: No CVA tenderness.  Lymph: No cervical or inguinal lymphadenopathy. Skin: No rashes, bruises or suspicious lesions. Neurologic: Grossly intact, no focal deficits, moving all 4 extremities. Psychiatric: Normal mood and affect.  Laboratory Data: Lab Results  Component Value Date   WBC 4.7 08/10/2019   HGB 12.9 (L) 08/10/2019   HCT 40.6 08/10/2019   MCV 90.4 08/10/2019   PLT 160 08/10/2019    Lab Results  Component Value Date   CREATININE 4.15 (H) 08/10/2019    Lab Results  Component Value Date   PSA <0.1 03/15/2019    Lab Results  Component Value Date   TESTOSTERONE 124 (L) 03/26/2020    No results found for: HGBA1C  Urinalysis    Component Value Date/Time   APPEARANCEUR Cloudy (A) 03/27/2020 0923   GLUCOSEU Negative 03/27/2020 0923   BILIRUBINUR Negative 03/27/2020 0923   PROTEINUR 3+ (A) 03/27/2020 0923   UROBILINOGEN 0.2 05/17/2019 0917   NITRITE Negative 03/27/2020 0923   LEUKOCYTESUR 3+ (A) 03/27/2020 0923    Lab Results  Component Value Date   LABMICR See below: 03/27/2020   WBCUA >30 (A) 03/27/2020   LABEPIT 0-10 03/27/2020   MUCUS Present 03/27/2020   BACTERIA Many (A) 03/27/2020    Pertinent Imaging:  Results for orders placed during the hospital encounter of 03/29/18  DG Abd 1 View  Narrative CLINICAL DATA:  Constipation for a few weeks.  EXAM: ABDOMEN - 1 VIEW  COMPARISON:  None.  FINDINGS: Normal bowel gas pattern without evidence of obstruction.  Mild generalized increased colonic stool burden.  No evidence of renal or ureteral stones.  Aortic stent graft extends from the lower aspect of L2 to common iliac artery arms over the upper sacrum.  Skeletal structures  intact.  IMPRESSION: 1. No acute findings.  No evidence of bowel obstruction. 2. Mild generalized increased colonic stool burden.   Electronically Signed By: Lajean Manes M.D. On: 03/30/2018 08:52  No results found for this or any previous visit.  No results found for this or any previous visit.  No results found for this or any previous visit.  Results for orders placed during the hospital encounter of 07/14/16  US Renal  Narrative CLINICAL DATA:  Protein urea, chronic renal insufficiency, assess postvoid residual volume  EXAM: RENAL / URINARY TRACT ULTRASOUND COMPLETE  COMPARISON:  Renal ultrasound of December 22, 2014  FINDINGS: Right Kidney:  Length: 12.2 cm. The renal cortical echotexture is somewhat  greater than that of the adjacent liver. Multiple cysts of present 2.7 in diameter. There is no hydronephrosis.  Left Kidney:  Length: 11.3 cm. The renal cortical echotexture is similar to that of the right kidney. There are multiple cystic structures measuring up to 1.4 cm in diameter. There is no hydronephrosis.  Bladder:  Urinary bladder exhibits diffuse wall thickening. The prostate gland produces a prominent impression upon the bladder base. The prevoid volume was 187 cc. The postvoid volume is 28 cc.  IMPRESSION: Multiple simple appearing renal cysts more conspicuous than on the previous study. Increased renal cortical echotexture consistent with medical renal disease. No hydronephrosis.  Thickened gallbladder wall.  28 cc postvoid residual volume.   Electronically Signed By: David  Martinique M.D. On: 07/14/2016 11:40  No results found for this or any previous visit.  No results found for this or any previous visit.  No results found for this or any previous visit.   Assessment & Plan:    1. Acute prostatitis -resolved - Urinalysis, Routine w reflex microscopic - BLADDER SCAN AMB NON-IMAGING -continue flomax 0.4mg  daily   No follow-ups  on file.  Nicolette Bang, MD  [facility] [place]

## 2020-04-20 NOTE — Patient Instructions (Signed)
Prostatitis  Prostatitis is swelling of the prostate gland, also called the prostate. This gland is about 1.5 inches wide and 1 inch high, and it helps to make a fluid called semen. The prostate is below a man's bladder, in front of the butt (rectum). There are different types of prostatitis. What are the causes? One type of prostatitis is caused by an infection from germs (bacteria). Another type is not caused by germs. It may be caused by:  Things having to do with the nervous system. This system includes thebrain, spinal cord, and nerves.  An autoimmune response. This happens when the body's disease-fighting system attacks healthy tissue in the body by mistake.  Psychological factors. These have to do with how the mind works. The causes of other types of prostatitis are normally not known. What are the signs or symptoms? Symptoms of this condition depend on the type of prostatitis you have. If your condition is caused by germs:  You may feel pain or burning when you pee (urinate).  You may pee often and all of a sudden.  You may have problems starting to pee.  You may have trouble emptying your bladder when you pee.  You may have fever or chills.  You may feel pain in your muscles, joints, low back, or lower belly. If you have other types of prostatitis:  You may pee often or all of a sudden.  You may have trouble starting to pee.  You may have a weak flow when you pee.  You may leak pee after using the bathroom.  You may have other problems, such as: ? Abnormal fluid coming from the penis. ? Pain in the testicles or penis. ? Pain between the butt and the testicles. ? Pain when fluid comes out of the penis during sex. How is this treated? Treatment for this condition depends on the type of prostatitis. Treatment may include:  Medicines. These may treat pain or swelling, or they may help relax muscles.  Exercises to help you move better or get stronger (physical  therapy).  Heat therapy.  Techniques to help you control some of the ways that your body works.  Exercises to help you relax.  Antibiotic medicine, if your condition is caused by germs.  Warm water baths (sitz baths) to relax muscles. Follow these instructions at home: Medicines  Take over-the-counter and prescription medicines only as told by your doctor.  If you were prescribed an antibiotic medicine, take it as told by your doctor. Do not stop using the antibiotic even if you start to feel better. Managing pain and swelling  Take sitz baths as told by your doctor. For a sitz bath, sit in warm water that is deep enough to cover your hips and butt.  If told, put heat on the painful area. Do this as often as told by your doctor. Use the heat source that your doctor recommends, such as a moist heat pack or a heating pad. ? Place a towel between your skin and the heat source. ? Leave the heat on for 20-30 minutes. ? Take off the heat if your skin turns bright red. This is very important if you are unable to feel pain, heat, or cold. You may have a greater risk of getting burned.   General instructions  Do exercises as told by your doctor, if your doctor prescribed them.  Keep all follow-up visits as told by your doctor. This is important. Where to find more information  [employer]  of Diabetes and Digestive and Kidney Diseases: http://www.bass.com/ Contact a doctor if:  Your symptoms get worse.  You have a fever. Get help right away if:  You have chills.  You feel light-headed.  You feel like you may faint.  You cannot pee.  You have blood or clumps of blood (blood clots) in your pee. Summary  Prostatitis is swelling of the prostate gland.  There are different types of prostatitis. Treatment depends on the type that you have.  Take over-the-counter and prescription medicines only as told by your doctor.  Get help right away of you have chills, feel  light-headed, or feel like you may faint. Also get help right away if you cannot pee or you have blood or clumps of blood in your pee. This information is not intended to replace advice given to you by your health care provider. Make sure you discuss any questions you have with your health care provider. Document Revised: 03/04/2019 Document Reviewed: 03/04/2019 Elsevier Patient Education  2021 [employer].

## 2020-04-20 NOTE — Progress Notes (Signed)
Bladder Scan Patient can void: 96 ml Performed By: Durenda Guthrie, lpn    Urological Symptom Review  Patient is experiencing the following symptoms: none   Review of Systems  Gastrointestinal (upper)  : Negative for upper GI symptoms  Gastrointestinal (lower) : Negative for lower GI symptoms  Constitutional : Fatigue  Skin: Negative for skin symptoms  Eyes: Negative for eye symptoms  Ear/Nose/Throat : Negative for Ear/Nose/Throat symptoms  Hematologic/Lymphatic: Negative for Hematologic/Lymphatic symptoms  Cardiovascular : Negative for cardiovascular symptoms  Respiratory : Negative for respiratory symptoms  Endocrine: Negative for endocrine symptoms  Musculoskeletal: Negative for musculoskeletal symptoms  Neurological: Negative for neurological symptoms  Psychologic: Negative for psychiatric symptoms

## 2020-04-30 DIAGNOSIS — N2581 Secondary hyperparathyroidism of renal origin: Secondary | ICD-10-CM | POA: Diagnosis not present

## 2020-04-30 DIAGNOSIS — E785 Hyperlipidemia, unspecified: Secondary | ICD-10-CM | POA: Diagnosis not present

## 2020-04-30 DIAGNOSIS — I10 Essential (primary) hypertension: Secondary | ICD-10-CM | POA: Diagnosis not present

## 2020-04-30 DIAGNOSIS — I519 Heart disease, unspecified: Secondary | ICD-10-CM | POA: Diagnosis not present

## 2020-04-30 DIAGNOSIS — I129 Hypertensive chronic kidney disease with stage 1 through stage 4 chronic kidney disease, or unspecified chronic kidney disease: Secondary | ICD-10-CM | POA: Diagnosis not present

## 2020-05-03 DIAGNOSIS — G4700 Insomnia, unspecified: Secondary | ICD-10-CM | POA: Diagnosis not present

## 2020-05-03 DIAGNOSIS — R600 Localized edema: Secondary | ICD-10-CM | POA: Diagnosis not present

## 2020-05-03 DIAGNOSIS — E782 Mixed hyperlipidemia: Secondary | ICD-10-CM | POA: Diagnosis not present

## 2020-05-03 DIAGNOSIS — I10 Essential (primary) hypertension: Secondary | ICD-10-CM | POA: Diagnosis not present

## 2020-05-03 DIAGNOSIS — R000 Tachycardia, unspecified: Secondary | ICD-10-CM | POA: Diagnosis not present

## 2020-05-03 DIAGNOSIS — H9193 Unspecified hearing loss, bilateral: Secondary | ICD-10-CM | POA: Diagnosis not present

## 2020-05-03 DIAGNOSIS — D692 Other nonthrombocytopenic purpura: Secondary | ICD-10-CM | POA: Diagnosis not present

## 2020-05-03 DIAGNOSIS — N184 Chronic kidney disease, stage 4 (severe): Secondary | ICD-10-CM | POA: Diagnosis not present

## 2020-05-03 DIAGNOSIS — Z720 Tobacco use: Secondary | ICD-10-CM | POA: Diagnosis not present

## 2020-05-03 DIAGNOSIS — N2581 Secondary hyperparathyroidism of renal origin: Secondary | ICD-10-CM | POA: Diagnosis not present

## 2020-05-08 ENCOUNTER — Ambulatory Visit: Payer: Medicare Other | Admitting: Urology

## 2020-05-09 DIAGNOSIS — Z720 Tobacco use: Secondary | ICD-10-CM | POA: Diagnosis not present

## 2020-05-09 DIAGNOSIS — N185 Chronic kidney disease, stage 5: Secondary | ICD-10-CM | POA: Diagnosis not present

## 2020-05-09 DIAGNOSIS — I5032 Chronic diastolic (congestive) heart failure: Secondary | ICD-10-CM | POA: Diagnosis not present

## 2020-05-09 DIAGNOSIS — I129 Hypertensive chronic kidney disease with stage 1 through stage 4 chronic kidney disease, or unspecified chronic kidney disease: Secondary | ICD-10-CM | POA: Diagnosis not present

## 2020-05-09 DIAGNOSIS — I10 Essential (primary) hypertension: Secondary | ICD-10-CM | POA: Diagnosis not present

## 2020-05-09 DIAGNOSIS — N189 Chronic kidney disease, unspecified: Secondary | ICD-10-CM | POA: Diagnosis not present

## 2020-05-09 DIAGNOSIS — M545 Low back pain, unspecified: Secondary | ICD-10-CM | POA: Diagnosis not present

## 2020-05-09 DIAGNOSIS — R600 Localized edema: Secondary | ICD-10-CM | POA: Diagnosis not present

## 2020-05-09 DIAGNOSIS — E782 Mixed hyperlipidemia: Secondary | ICD-10-CM | POA: Diagnosis not present

## 2020-05-09 DIAGNOSIS — E872 Acidosis: Secondary | ICD-10-CM | POA: Diagnosis not present

## 2020-05-09 DIAGNOSIS — D631 Anemia in chronic kidney disease: Secondary | ICD-10-CM | POA: Diagnosis not present

## 2020-05-09 DIAGNOSIS — N2581 Secondary hyperparathyroidism of renal origin: Secondary | ICD-10-CM | POA: Diagnosis not present

## 2020-05-09 DIAGNOSIS — N184 Chronic kidney disease, stage 4 (severe): Secondary | ICD-10-CM | POA: Diagnosis not present

## 2020-05-09 DIAGNOSIS — N170 Acute kidney failure with tubular necrosis: Secondary | ICD-10-CM | POA: Diagnosis not present

## 2020-05-09 DIAGNOSIS — E211 Secondary hyperparathyroidism, not elsewhere classified: Secondary | ICD-10-CM | POA: Diagnosis not present

## 2020-05-09 DIAGNOSIS — G4700 Insomnia, unspecified: Secondary | ICD-10-CM | POA: Diagnosis not present

## 2020-05-09 DIAGNOSIS — D692 Other nonthrombocytopenic purpura: Secondary | ICD-10-CM | POA: Diagnosis not present

## 2020-05-09 DIAGNOSIS — H9193 Unspecified hearing loss, bilateral: Secondary | ICD-10-CM | POA: Diagnosis not present

## 2020-05-17 ENCOUNTER — Telehealth: Payer: Self-pay

## 2020-05-17 NOTE — Telephone Encounter (Signed)
Spoke with patient's wife who says patient does not wish to schedule access surgery currently. He was last seen by Dr. Donnetta Hutching 11/28/2019. He will likely need an OV prior to scheduling surgery when he wishes to do so.

## 2020-05-30 DIAGNOSIS — N189 Chronic kidney disease, unspecified: Secondary | ICD-10-CM | POA: Diagnosis not present

## 2020-05-30 DIAGNOSIS — I5032 Chronic diastolic (congestive) heart failure: Secondary | ICD-10-CM | POA: Diagnosis not present

## 2020-05-30 DIAGNOSIS — E211 Secondary hyperparathyroidism, not elsewhere classified: Secondary | ICD-10-CM | POA: Diagnosis not present

## 2020-05-30 DIAGNOSIS — N185 Chronic kidney disease, stage 5: Secondary | ICD-10-CM | POA: Diagnosis not present

## 2020-05-30 DIAGNOSIS — I129 Hypertensive chronic kidney disease with stage 1 through stage 4 chronic kidney disease, or unspecified chronic kidney disease: Secondary | ICD-10-CM | POA: Diagnosis not present

## 2020-06-06 ENCOUNTER — Other Ambulatory Visit: Payer: Self-pay

## 2020-06-06 ENCOUNTER — Encounter: Payer: Self-pay | Admitting: Urology

## 2020-06-06 ENCOUNTER — Telehealth (INDEPENDENT_AMBULATORY_CARE_PROVIDER_SITE_OTHER): Payer: Medicare Other | Admitting: Urology

## 2020-06-06 DIAGNOSIS — N138 Other obstructive and reflux uropathy: Secondary | ICD-10-CM

## 2020-06-06 DIAGNOSIS — C61 Malignant neoplasm of prostate: Secondary | ICD-10-CM

## 2020-06-06 DIAGNOSIS — N185 Chronic kidney disease, stage 5: Secondary | ICD-10-CM | POA: Diagnosis not present

## 2020-06-06 DIAGNOSIS — I129 Hypertensive chronic kidney disease with stage 1 through stage 4 chronic kidney disease, or unspecified chronic kidney disease: Secondary | ICD-10-CM | POA: Diagnosis not present

## 2020-06-06 DIAGNOSIS — I5032 Chronic diastolic (congestive) heart failure: Secondary | ICD-10-CM | POA: Diagnosis not present

## 2020-06-06 DIAGNOSIS — N401 Enlarged prostate with lower urinary tract symptoms: Secondary | ICD-10-CM

## 2020-06-06 DIAGNOSIS — R3912 Poor urinary stream: Secondary | ICD-10-CM

## 2020-06-06 DIAGNOSIS — N189 Chronic kidney disease, unspecified: Secondary | ICD-10-CM | POA: Diagnosis not present

## 2020-06-06 DIAGNOSIS — D631 Anemia in chronic kidney disease: Secondary | ICD-10-CM | POA: Diagnosis not present

## 2020-06-06 DIAGNOSIS — E211 Secondary hyperparathyroidism, not elsewhere classified: Secondary | ICD-10-CM | POA: Diagnosis not present

## 2020-06-06 MED ORDER — TAMSULOSIN HCL 0.4 MG PO CAPS: 0.4000 mg | ORAL_CAPSULE | Freq: Every day | ORAL | 3 refills | Status: DC

## 2020-06-06 NOTE — Progress Notes (Signed)
06/06/2020 10:10 AM   Ronald [name] [birth year] [id number]  Referring provider: Celene Squibb, MD [address] [name],  [place] [address]  Patient location: home Physician location: office I connected with  Ronald [name] on 06/19/20 by a video enabled telemedicine application and verified that I am speaking with the correct person using two identifiers.   I discussed the limitations of evaluation and management by telemedicine. The patient expressed understanding and agreed to proceed.   Followup BPH  HPI: [name] is a 80yo here for followup for BPH. No worsening LUTS since last visit. No dysuria. No straining to urinate. Overall he is happy with his progress after antibiotic and alpha blocker therapy. He remains on flomax 0.4mg  daily   PMH: Past Medical History:  Diagnosis Date  . GERD (gastroesophageal reflux disease)   . Hypertension   . Myocardial infarction ([place])    1999  . Renal insufficiency   . Stroke [facility])    no deficits; had stroke while trying to place "stents in legs".    Surgical History: Past Surgical History:  Procedure Laterality Date  . ABDOMINAL AORTIC ANEURYSM REPAIR  2012   IN [place]  . APPENDECTOMY    . CORONARY ARTERY BYPASS GRAFT     3 vessels 18 years ago  . INGUINAL HERNIA REPAIR Right 09/20/2015   Procedure: REPAIR RIGHT INGUINAL HERNIA  WITH MESH;  Surgeon: Vickie Epley, MD;  Location: [facility];  Service: General;  Laterality: Right;  . tumor removal from bladder      Home Medications:  Allergies as of 06/06/2020   No Known Allergies     Medication List       Accurate as of June 06, 2020 10:10 AM. If you have any questions, ask your nurse or doctor.        amLODipine 5 MG tablet Commonly known as: NORVASC Take 5 mg by mouth daily.   ascorbic acid 1000 MG tablet Commonly known as: VITAMIN C Take by mouth.   atorvastatin 80 MG tablet Commonly known as: LIPITOR Take 80 mg by mouth daily.    carvedilol 12.5 MG tablet Commonly known as: COREG Take 6.25 mg by mouth 2 (two) times daily.   cephALEXin 250 MG capsule Commonly known as: Keflex Take 1 capsule (250 mg total) by mouth 2 (two) times daily.   clopidogrel 75 MG tablet Commonly known as: PLAVIX Take 75 mg by mouth daily.   furosemide 40 MG tablet Commonly known as: LASIX Take 40 mg by mouth daily.   losartan 50 MG tablet Commonly known as: COZAAR Take 1 tablet (50 mg total) by mouth daily.   nitrofurantoin (macrocrystal-monohydrate) 100 MG capsule Commonly known as: MACROBID Take 1 capsule (100 mg total) by mouth 2 (two) times daily.   rosuvastatin 20 MG tablet Commonly known as: CRESTOR Take 20 mg by mouth daily.   sulfamethoxazole-trimethoprim 800-160 MG tablet Commonly known as: BACTRIM DS Take 1 tablet by mouth 2 (two) times daily.   tamsulosin 0.4 MG Caps capsule Commonly known as: FLOMAX Take 1 capsule (0.4 mg total) by mouth daily after supper.   topiramate 25 MG tablet Commonly known as: TOPAMAX Take 25 mg by mouth 2 (two) times daily.   traMADol 50 MG tablet Commonly known as: ULTRAM Take by mouth every 6 (six) hours as needed.   traZODone 50 MG tablet Commonly known as: DESYREL TAKE 1 TABLET (50 MG TOTAL) BY MOUTH EVERY NIGHT   varenicline 1  MG tablet Commonly known as: CHANTIX   Vitamin D (Ergocalciferol) 1.25 MG (50000 UNIT) Caps capsule Commonly known as: DRISDOL Take 50,000 Units by mouth once a week.   zolpidem 10 MG tablet Commonly known as: AMBIEN Take 10 mg by mouth at bedtime.       Allergies: No Known Allergies  Family History: Family History  Problem Relation Age of Onset  . Cancer Father     Social History:  reports that he has been smoking cigarettes. He started smoking about 62 years ago. He has a 100.00 pack-year smoking history. He has never used smokeless tobacco. He reports that he does not drink alcohol and does not use drugs.  ROS: All other  review of systems were reviewed and are negative except what is noted above in HPI  Physical Exam: There were no vitals taken for this visit.  Constitutional:  Alert and oriented, No acute distress. HEENT: [place] AT, moist mucus membranes.  Trachea midline, no masses. Cardiovascular: No clubbing, cyanosis, or edema. Respiratory: Normal respiratory effort, no increased work of breathing. GI: Abdomen is soft, nontender, nondistended, no abdominal masses GU: No CVA tenderness.  Lymph: No cervical or inguinal lymphadenopathy. Skin: No rashes, bruises or suspicious lesions. Neurologic: Grossly intact, no focal deficits, moving all 4 extremities. Psychiatric: Normal mood and affect.  Laboratory Data: Lab Results  Component Value Date   WBC 4.7 08/10/2019   HGB 12.9 (L) 08/10/2019   HCT 40.6 08/10/2019   MCV 90.4 08/10/2019   PLT 160 08/10/2019    Lab Results  Component Value Date   CREATININE 4.15 (H) 08/10/2019    Lab Results  Component Value Date   PSA <0.1 03/15/2019    Lab Results  Component Value Date   TESTOSTERONE 124 (L) 03/26/2020    No results found for: HGBA1C  Urinalysis    Component Value Date/Time   APPEARANCEUR Clear 04/20/2020 0952   GLUCOSEU Negative 04/20/2020 0952   BILIRUBINUR Negative 04/20/2020 0952   PROTEINUR 3+ (A) 04/20/2020 0952   UROBILINOGEN 0.2 05/17/2019 0917   NITRITE Negative 04/20/2020 0952   LEUKOCYTESUR Negative 04/20/2020 0952    Lab Results  Component Value Date   LABMICR See below: 04/20/2020   WBCUA 6-10 (A) 04/20/2020   LABEPIT 0-10 04/20/2020   MUCUS Present 03/27/2020   BACTERIA Few 04/20/2020    Pertinent Imaging:  Results for orders placed during the hospital encounter of 03/29/18  DG Abd 1 View  Narrative CLINICAL DATA:  Constipation for a few weeks.  EXAM: ABDOMEN - 1 VIEW  COMPARISON:  None.  FINDINGS: Normal bowel gas pattern without evidence of obstruction.  Mild generalized increased colonic stool  burden.  No evidence of renal or ureteral stones.  Aortic stent graft extends from the lower aspect of L2 to common iliac artery arms over the upper sacrum.  Skeletal structures intact.  IMPRESSION: 1. No acute findings.  No evidence of bowel obstruction. 2. Mild generalized increased colonic stool burden.   Electronically Signed By: Lajean Manes M.D. On: 03/30/2018 08:52  No results found for this or any previous visit.  No results found for this or any previous visit.  No results found for this or any previous visit.  Results for orders placed during the hospital encounter of 07/14/16  US Renal  Narrative CLINICAL DATA:  Protein urea, chronic renal insufficiency, assess postvoid residual volume  EXAM: RENAL / URINARY TRACT ULTRASOUND COMPLETE  COMPARISON:  Renal ultrasound of December 22, 2014  FINDINGS: Right  Kidney:  Length: 12.2 cm. The renal cortical echotexture is somewhat greater than that of the adjacent liver. Multiple cysts of present 2.7 in diameter. There is no hydronephrosis.  Left Kidney:  Length: 11.3 cm. The renal cortical echotexture is similar to that of the right kidney. There are multiple cystic structures measuring up to 1.4 cm in diameter. There is no hydronephrosis.  Bladder:  Urinary bladder exhibits diffuse wall thickening. The prostate gland produces a prominent impression upon the bladder base. The prevoid volume was 187 cc. The postvoid volume is 28 cc.  IMPRESSION: Multiple simple appearing renal cysts more conspicuous than on the previous study. Increased renal cortical echotexture consistent with medical renal disease. No hydronephrosis.  Thickened gallbladder wall.  28 cc postvoid residual volume.   Electronically Signed By: David  Martinique M.D. On: 07/14/2016 11:40  No results found for this or any previous visit.  No results found for this or any previous visit.  No results found for this or any previous  visit.   Assessment & Plan:    1. Benign prostatic hyperplasia with urinary obstruction -continue flomax 0.99mng daily  2. Weak urinary stream -continue flomax 0.4mg  daily  Followup 6 months with a PSA   No follow-ups on file.  Nicolette Bang, MD  [facility] [place]

## 2020-06-06 NOTE — Patient Instructions (Signed)

## 2020-06-28 DIAGNOSIS — N185 Chronic kidney disease, stage 5: Secondary | ICD-10-CM | POA: Diagnosis not present

## 2020-06-28 DIAGNOSIS — N189 Chronic kidney disease, unspecified: Secondary | ICD-10-CM | POA: Diagnosis not present

## 2020-06-28 DIAGNOSIS — I5032 Chronic diastolic (congestive) heart failure: Secondary | ICD-10-CM | POA: Diagnosis not present

## 2020-06-28 DIAGNOSIS — E211 Secondary hyperparathyroidism, not elsewhere classified: Secondary | ICD-10-CM | POA: Diagnosis not present

## 2020-07-05 DIAGNOSIS — N185 Chronic kidney disease, stage 5: Secondary | ICD-10-CM | POA: Diagnosis not present

## 2020-07-05 DIAGNOSIS — I5032 Chronic diastolic (congestive) heart failure: Secondary | ICD-10-CM | POA: Diagnosis not present

## 2020-07-05 DIAGNOSIS — I129 Hypertensive chronic kidney disease with stage 1 through stage 4 chronic kidney disease, or unspecified chronic kidney disease: Secondary | ICD-10-CM | POA: Diagnosis not present

## 2020-07-05 DIAGNOSIS — N189 Chronic kidney disease, unspecified: Secondary | ICD-10-CM | POA: Diagnosis not present

## 2020-07-05 DIAGNOSIS — E211 Secondary hyperparathyroidism, not elsewhere classified: Secondary | ICD-10-CM | POA: Diagnosis not present

## 2020-07-05 DIAGNOSIS — D631 Anemia in chronic kidney disease: Secondary | ICD-10-CM | POA: Diagnosis not present

## 2020-07-12 ENCOUNTER — Other Ambulatory Visit: Payer: Self-pay

## 2020-07-12 DIAGNOSIS — N184 Chronic kidney disease, stage 4 (severe): Secondary | ICD-10-CM

## 2020-07-16 ENCOUNTER — Ambulatory Visit (INDEPENDENT_AMBULATORY_CARE_PROVIDER_SITE_OTHER): Payer: Medicare Other | Admitting: Vascular Surgery

## 2020-07-16 ENCOUNTER — Other Ambulatory Visit: Payer: Self-pay

## 2020-07-16 ENCOUNTER — Encounter: Payer: Self-pay | Admitting: Vascular Surgery

## 2020-07-16 ENCOUNTER — Ambulatory Visit (HOSPITAL_COMMUNITY)
Admission: RE | Admit: 2020-07-16 | Discharge: 2020-07-16 | Disposition: A | Discharge status: Home / Self Care | Payer: Medicare Other | Source: Ambulatory Visit | Attending: Vascular Surgery | Admitting: Vascular Surgery

## 2020-07-16 VITALS — BP 164/76 | HR 63 | Temp 97.6°F | Resp 14 | Ht 72.0 in | Wt 198.0 lb

## 2020-07-16 DIAGNOSIS — N184 Chronic kidney disease, stage 4 (severe): Secondary | ICD-10-CM

## 2020-07-16 DIAGNOSIS — Z01818 Encounter for other preprocedural examination: Secondary | ICD-10-CM | POA: Diagnosis not present

## 2020-07-16 NOTE — H&P (View-Only) (Signed)
[facility] [place]  Patient name: Ronald [name] MRN: [id number] DOB: 06-Oct-1940 Sex: male  REASON FOR VISIT: Discuss access for hemodialysis  Nephrologist: Theador Hawthorne  HPI: Ronald [name] is a 80 y.o. male here for discussion of access for hemodialysis.  He is here today with his wife.  I had an office visit with him in the follow-up 2021.  At that time he was stage IV kidney disease.  He had been scheduled for a left arm AV fistula creation.  He canceled this in January due to concerns of COVID resurgence.  He now is seen again.  He has had progression and is now approaching need for hemodialysis.  We have been asked to place a catheter and permanent access.  He denies any uremic symptoms.  He is not sick to his stomach.  He does have some swelling in his lower extremities.  He denies any shortness of breath.  Past Medical History:  Diagnosis Date  . GERD (gastroesophageal reflux disease)   . Hypertension   . Myocardial infarction ([place])    1999  . Renal insufficiency   . Stroke [facility])    no deficits; had stroke while trying to place "stents in legs".    Family History  Problem Relation Age of Onset  . Cancer Father     SOCIAL HISTORY: Social History   Tobacco Use  . Smoking status: Current Every Day Smoker    Packs/day: 2.00    Years: 50.00    Pack years: 100.00    Types: Cigarettes    Start date: 08/07/1957  . Smokeless tobacco: Never Used  . Tobacco comment: on chantix now  Substance Use Topics  . Alcohol use: No    No Known Allergies  Current Outpatient Medications  Medication Sig Dispense Refill  . amLODipine (NORVASC) 5 MG tablet Take 5 mg by mouth daily.    Marland Kitchen ascorbic acid (VITAMIN C) 1000 MG tablet Take by mouth.    Marland Kitchen atorvastatin (LIPITOR) 80 MG tablet Take 80 mg by mouth daily.    . carvedilol (COREG) 12.5 MG tablet Take 6.25 mg by mouth 2 (two) times daily.    . clopidogrel (PLAVIX) 75  MG tablet Take 75 mg by mouth daily.     . furosemide (LASIX) 40 MG tablet Take 40 mg by mouth daily.     . tamsulosin (FLOMAX) 0.4 MG CAPS capsule Take 1 capsule (0.4 mg total) by mouth daily after supper. 90 capsule 3  . traMADol (ULTRAM) 50 MG tablet Take by mouth every 6 (six) hours as needed.    . varenicline (CHANTIX) 1 MG tablet     . zolpidem (AMBIEN) 10 MG tablet Take 10 mg by mouth at bedtime.     No current facility-administered medications for this visit.    REVIEW OF SYSTEMS:  [X]  denotes positive finding, [ ]  denotes negative finding Cardiac  Comments:  Chest pain or chest pressure:    Shortness of breath upon exertion:    Short of breath when lying flat:    Irregular heart rhythm:        Vascular    Pain in calf, thigh, or hip brought on by ambulation:    Pain in feet at night that wakes you up from your sleep:     Blood clot in your veins:    Leg swelling:  x         PHYSICAL EXAM: Vitals:   07/16/20 1419  BP: Marland Kitchen)  164/76  Pulse: 63  Resp: 14  Temp: 97.6 F (36.4 C)  TempSrc: Other (Comment)  SpO2: 96%  Weight: 198 lb (89.8 kg)  Height: 6' (1.829 m)    GENERAL: The patient is a well-nourished male, in no acute distress. The vital signs are documented above. CARDIOVASCULAR: 2+ radial pulses bilaterally.  Very well-developed left arm service vein and the cephalic vein.  This does extend in the usual course but then more on the dorsum of his wrist. PULMONARY: There is good air exchange  MUSCULOSKELETAL: There are no major deformities or cyanosis. NEUROLOGIC: No focal weakness or paresthesias are detected. SKIN: There are no ulcers or rashes noted.  Multiple bruises on both upper extremities PSYCHIATRIC: The patient has a normal affect.  DATA:  None  MEDICAL ISSUES: I again discussed the need for hemodialysis access.  He now is progressing to need for hemodialysis and we have been quested to place catheter and fistula.  I discussed both of these at length  with the patient and his wife including of potential limitations with infection of the catheter and also not maturation and thromboses of the fistula.  He does have a very nice vein so I think he has a high likelihood of success.  We will communicate with Dr. Toya Smothers office.  We will plan for surgery on August 01, 2020 at [facility].  This is the next available date at [facility].  If he needs surgery prior to this, can proceed at [place], MD [facility] Tel [phone]  Note: Portions of this report may have been transcribed using voice recognition software.  Every effort has been made to ensure accuracy; however, inadvertent computerized transcription errors may still be present.

## 2020-07-16 NOTE — Progress Notes (Signed)
[facility] [place]  Patient name: Ronald [name] MRN: [id number] DOB: [birth year] Sex: male  REASON FOR VISIT: Discuss access for hemodialysis  Nephrologist: Theador Hawthorne  HPI: Ronald [name] is a 80 y.o. male here for discussion of access for hemodialysis.  He is here today with his wife.  I had an office visit with him in the follow-up 2021.  At that time he was stage IV kidney disease.  He had been scheduled for a left arm AV fistula creation.  He canceled this in January due to concerns of COVID resurgence.  He now is seen again.  He has had progression and is now approaching need for hemodialysis.  We have been asked to place a catheter and permanent access.  He denies any uremic symptoms.  He is not sick to his stomach.  He does have some swelling in his lower extremities.  He denies any shortness of breath.  Past Medical History:  Diagnosis Date  . GERD (gastroesophageal reflux disease)   . Hypertension   . Myocardial infarction ([place])    1999  . Renal insufficiency   . Stroke [facility])    no deficits; had stroke while trying to place "stents in legs".    Family History  Problem Relation Age of Onset  . Cancer Father     SOCIAL HISTORY: Social History   Tobacco Use  . Smoking status: Current Every Day Smoker    Packs/day: 2.00    Years: 50.00    Pack years: 100.00    Types: Cigarettes    Start date: 08/07/1957  . Smokeless tobacco: Never Used  . Tobacco comment: on chantix now  Substance Use Topics  . Alcohol use: No    No Known Allergies  Current Outpatient Medications  Medication Sig Dispense Refill  . amLODipine (NORVASC) 5 MG tablet Take 5 mg by mouth daily.    Marland Kitchen ascorbic acid (VITAMIN C) 1000 MG tablet Take by mouth.    Marland Kitchen atorvastatin (LIPITOR) 80 MG tablet Take 80 mg by mouth daily.    . carvedilol (COREG) 12.5 MG tablet Take 6.25 mg by mouth 2 (two) times daily.    . clopidogrel (PLAVIX) 75  MG tablet Take 75 mg by mouth daily.     . furosemide (LASIX) 40 MG tablet Take 40 mg by mouth daily.     . tamsulosin (FLOMAX) 0.4 MG CAPS capsule Take 1 capsule (0.4 mg total) by mouth daily after supper. 90 capsule 3  . traMADol (ULTRAM) 50 MG tablet Take by mouth every 6 (six) hours as needed.    . varenicline (CHANTIX) 1 MG tablet     . zolpidem (AMBIEN) 10 MG tablet Take 10 mg by mouth at bedtime.     No current facility-administered medications for this visit.    REVIEW OF SYSTEMS:  [X]  denotes positive finding, [ ]  denotes negative finding Cardiac  Comments:  Chest pain or chest pressure:    Shortness of breath upon exertion:    Short of breath when lying flat:    Irregular heart rhythm:        Vascular    Pain in calf, thigh, or hip brought on by ambulation:    Pain in feet at night that wakes you up from your sleep:     Blood clot in your veins:    Leg swelling:  x         PHYSICAL EXAM: Vitals:   07/16/20 1419  BP: Marland Kitchen)  164/76  Pulse: 63  Resp: 14  Temp: 97.6 F (36.4 C)  TempSrc: Other (Comment)  SpO2: 96%  Weight: 198 lb (89.8 kg)  Height: 6' (1.829 m)    GENERAL: The patient is a well-nourished male, in no acute distress. The vital signs are documented above. CARDIOVASCULAR: 2+ radial pulses bilaterally.  Very well-developed left arm service vein and the cephalic vein.  This does extend in the usual course but then more on the dorsum of his wrist. PULMONARY: There is good air exchange  MUSCULOSKELETAL: There are no major deformities or cyanosis. NEUROLOGIC: No focal weakness or paresthesias are detected. SKIN: There are no ulcers or rashes noted.  Multiple bruises on both upper extremities PSYCHIATRIC: The patient has a normal affect.  DATA:  None  MEDICAL ISSUES: I again discussed the need for hemodialysis access.  He now is progressing to need for hemodialysis and we have been quested to place catheter and fistula.  I discussed both of these at length  with the patient and his wife including of potential limitations with infection of the catheter and also not maturation and thromboses of the fistula.  He does have a very nice vein so I think he has a high likelihood of success.  We will communicate with Dr. Toya Smothers office.  We will plan for surgery on August 01, 2020 at [facility].  This is the next available date at [facility].  If he needs surgery prior to this, can proceed at [place], MD [facility] Tel [phone]  Note: Portions of this report may have been transcribed using voice recognition software.  Every effort has been made to ensure accuracy; however, inadvertent computerized transcription errors may still be present.

## 2020-07-17 ENCOUNTER — Other Ambulatory Visit: Payer: Self-pay

## 2020-07-30 ENCOUNTER — Other Ambulatory Visit: Payer: Self-pay

## 2020-07-30 ENCOUNTER — Ambulatory Visit
Admission: EM | Admit: 2020-07-30 | Discharge: 2020-07-30 | Disposition: A | Discharge status: Home / Self Care | Payer: Medicare Other | Attending: Family Medicine | Admitting: Family Medicine

## 2020-07-30 ENCOUNTER — Encounter (HOSPITAL_COMMUNITY)
Admission: RE | Admit: 2020-07-30 | Discharge: 2020-07-30 | Disposition: A | Discharge status: Home / Self Care | Payer: Medicare Other | Source: Ambulatory Visit | Attending: Vascular Surgery | Admitting: Vascular Surgery

## 2020-07-30 ENCOUNTER — Encounter (HOSPITAL_COMMUNITY): Payer: Self-pay

## 2020-07-30 DIAGNOSIS — Z1152 Encounter for screening for COVID-19: Secondary | ICD-10-CM

## 2020-07-30 NOTE — ED Triage Notes (Signed)
Needs covid test for procedure  

## 2020-07-31 LAB — NOVEL CORONAVIRUS, NAA: SARS-CoV-2, NAA: NOT DETECTED

## 2020-07-31 LAB — SARS-COV-2, NAA 2 DAY TAT

## 2020-08-02 ENCOUNTER — Ambulatory Visit (HOSPITAL_COMMUNITY): Payer: Medicare Other | Admitting: Anesthesiology

## 2020-08-02 ENCOUNTER — Ambulatory Visit (HOSPITAL_COMMUNITY)
Admission: RE | Admit: 2020-08-02 | Discharge: 2020-08-02 | Disposition: A | Discharge status: Home / Self Care | Payer: Medicare Other | Attending: Vascular Surgery | Admitting: Vascular Surgery

## 2020-08-02 ENCOUNTER — Other Ambulatory Visit: Payer: Self-pay

## 2020-08-02 ENCOUNTER — Encounter (HOSPITAL_COMMUNITY): Payer: Self-pay | Admitting: Vascular Surgery

## 2020-08-02 ENCOUNTER — Ambulatory Visit (HOSPITAL_COMMUNITY): Payer: Medicare Other

## 2020-08-02 ENCOUNTER — Encounter (HOSPITAL_COMMUNITY)
Admission: RE | Disposition: A | Discharge status: Home / Self Care | Payer: Self-pay | Source: Home / Self Care | Attending: Vascular Surgery

## 2020-08-02 ENCOUNTER — Telehealth: Payer: Self-pay | Admitting: Cardiovascular Disease

## 2020-08-02 DIAGNOSIS — Z7902 Long term (current) use of antithrombotics/antiplatelets: Secondary | ICD-10-CM | POA: Diagnosis not present

## 2020-08-02 DIAGNOSIS — F17210 Nicotine dependence, cigarettes, uncomplicated: Secondary | ICD-10-CM | POA: Diagnosis not present

## 2020-08-02 DIAGNOSIS — I120 Hypertensive chronic kidney disease with stage 5 chronic kidney disease or end stage renal disease: Secondary | ICD-10-CM | POA: Diagnosis not present

## 2020-08-02 DIAGNOSIS — Z79899 Other long term (current) drug therapy: Secondary | ICD-10-CM | POA: Insufficient documentation

## 2020-08-02 DIAGNOSIS — Z4902 Encounter for fitting and adjustment of peritoneal dialysis catheter: Secondary | ICD-10-CM

## 2020-08-02 DIAGNOSIS — I4891 Unspecified atrial fibrillation: Secondary | ICD-10-CM | POA: Insufficient documentation

## 2020-08-02 DIAGNOSIS — N186 End stage renal disease: Secondary | ICD-10-CM

## 2020-08-02 DIAGNOSIS — Z452 Encounter for adjustment and management of vascular access device: Secondary | ICD-10-CM | POA: Diagnosis not present

## 2020-08-02 HISTORY — PX: INSERTION OF DIALYSIS CATHETER: SHX1324

## 2020-08-02 LAB — POCT I-STAT, CHEM 8
BUN: 76 mg/dL — ABNORMAL HIGH (ref 8–23)
Calcium, Ion: 1 mmol/L — ABNORMAL LOW (ref 1.15–1.40)
Chloride: 104 mmol/L (ref 98–111)
Creatinine, Ser: 7.7 mg/dL — ABNORMAL HIGH (ref 0.61–1.24)
Glucose, Bld: 93 mg/dL (ref 70–99)
HCT: 34 % — ABNORMAL LOW (ref 39.0–52.0)
Hemoglobin: 11.6 g/dL — ABNORMAL LOW (ref 13.0–17.0)
Potassium: 3.8 mmol/L (ref 3.5–5.1)
Sodium: 139 mmol/L (ref 135–145)
TCO2: 21 mmol/L — ABNORMAL LOW (ref 22–32)

## 2020-08-02 SURGERY — INSERTION OF DIALYSIS CATHETER
Anesthesia: General | Laterality: Right

## 2020-08-02 MED ORDER — ONDANSETRON HCL 4 MG/2ML IJ SOLN: 4.0000 mg | Freq: Once | INTRAMUSCULAR | Status: DC | PRN

## 2020-08-02 MED ORDER — FENTANYL CITRATE (PF) 100 MCG/2ML IJ SOLN
INTRAMUSCULAR | Status: DC | PRN
  Administered 2020-08-02 (×2): 50 ug via INTRAVENOUS

## 2020-08-02 MED ORDER — LIDOCAINE-EPINEPHRINE 0.5 %-1:200000 IJ SOLN
INTRAMUSCULAR | Status: AC
  Filled 2020-08-02: qty 1

## 2020-08-02 MED ORDER — HEPARIN SODIUM (PORCINE) 1000 UNIT/ML IJ SOLN
INTRAMUSCULAR | Status: DC | PRN
  Administered 2020-08-02: 4000 [IU]

## 2020-08-02 MED ORDER — LIDOCAINE-EPINEPHRINE 0.5 %-1:200000 IJ SOLN
INTRAMUSCULAR | Status: DC | PRN
  Administered 2020-08-02: 3 mL

## 2020-08-02 MED ORDER — CHLORHEXIDINE GLUCONATE 4 % EX LIQD: 60.0000 mL | Freq: Once | CUTANEOUS | Status: DC

## 2020-08-02 MED ORDER — PROPOFOL 10 MG/ML IV BOLUS
INTRAVENOUS | Status: DC | PRN
  Administered 2020-08-02: 30 mg via INTRAVENOUS
  Administered 2020-08-02: 50 mg via INTRAVENOUS

## 2020-08-02 MED ORDER — FENTANYL CITRATE (PF) 100 MCG/2ML IJ SOLN
INTRAMUSCULAR | Status: AC
  Filled 2020-08-02: qty 2

## 2020-08-02 MED ORDER — ORAL CARE MOUTH RINSE: 15.0000 mL | Freq: Once | OROMUCOSAL | Status: AC

## 2020-08-02 MED ORDER — 0.9 % SODIUM CHLORIDE (POUR BTL) OPTIME
TOPICAL | Status: DC | PRN
  Administered 2020-08-02: 1000 mL

## 2020-08-02 MED ORDER — SODIUM CHLORIDE 0.9 % IV SOLN
INTRAVENOUS | Status: DC | PRN
  Administered 2020-08-02: 500 mL

## 2020-08-02 MED ORDER — PHENYLEPHRINE HCL-NACL 10-0.9 MG/250ML-% IV SOLN
INTRAVENOUS | Status: AC
  Filled 2020-08-02: qty 250

## 2020-08-02 MED ORDER — CEFAZOLIN SODIUM-DEXTROSE 2-4 GM/100ML-% IV SOLN
2.0000 g | INTRAVENOUS | Status: AC
  Administered 2020-08-02: 2 g via INTRAVENOUS
  Filled 2020-08-02: qty 100

## 2020-08-02 MED ORDER — MIDAZOLAM HCL 2 MG/2ML IJ SOLN
INTRAMUSCULAR | Status: AC
  Filled 2020-08-02: qty 2

## 2020-08-02 MED ORDER — PROPOFOL 10 MG/ML IV BOLUS
INTRAVENOUS | Status: AC
  Filled 2020-08-02: qty 40

## 2020-08-02 MED ORDER — LACTATED RINGERS IV SOLN: INTRAVENOUS | Status: DC

## 2020-08-02 MED ORDER — SODIUM CHLORIDE 0.9 % IV SOLN: INTRAVENOUS | Status: DC

## 2020-08-02 MED ORDER — FENTANYL CITRATE (PF) 100 MCG/2ML IJ SOLN: 25.0000 ug | INTRAMUSCULAR | Status: DC | PRN

## 2020-08-02 MED ORDER — ONDANSETRON HCL 4 MG/2ML IJ SOLN
INTRAMUSCULAR | Status: AC
  Filled 2020-08-02: qty 2

## 2020-08-02 MED ORDER — MIDAZOLAM HCL 5 MG/5ML IJ SOLN
INTRAMUSCULAR | Status: DC | PRN
  Administered 2020-08-02: 1 mg via INTRAVENOUS

## 2020-08-02 MED ORDER — CHLORHEXIDINE GLUCONATE 0.12 % MT SOLN
15.0000 mL | Freq: Once | OROMUCOSAL | Status: AC
  Administered 2020-08-02: 15 mL via OROMUCOSAL

## 2020-08-02 MED ORDER — HEPARIN SODIUM (PORCINE) 1000 UNIT/ML IJ SOLN
INTRAMUSCULAR | Status: AC
  Filled 2020-08-02: qty 6

## 2020-08-02 SURGICAL SUPPLY — 55 items
ADH SKN CLS APL DERMABOND .7 (GAUZE/BANDAGES/DRESSINGS) ×3
ARMBAND PINK RESTRICT EXTREMIT (MISCELLANEOUS) ×4 IMPLANT
BAG DECANTER FOR FLEXI CONT (MISCELLANEOUS) ×4 IMPLANT
BAG HAMPER (MISCELLANEOUS) ×4 IMPLANT
BIOPATCH RED 1 DISK 7.0 (GAUZE/BANDAGES/DRESSINGS) ×4 IMPLANT
CANNULA VESSEL 3MM 2 BLNT TIP (CANNULA) ×4 IMPLANT
CATH PALINDROME-P 23CM W/VT (CATHETERS) ×4 IMPLANT
CLIP LIGATING EXTRA MED SLVR (CLIP) ×4 IMPLANT
CLIP LIGATING EXTRA SM BLUE (MISCELLANEOUS) ×4 IMPLANT
COVER LIGHT HANDLE STERIS (MISCELLANEOUS) ×8 IMPLANT
COVER MAYO STAND XLG (MISCELLANEOUS) ×4 IMPLANT
COVER PROBE U/S 5X48 (MISCELLANEOUS) ×4 IMPLANT
COVER PROBE W GEL 5X96 (DRAPES) ×4 IMPLANT
COVER SURGICAL LIGHT HANDLE (MISCELLANEOUS) ×4 IMPLANT
COVER WAND RF STERILE (DRAPES) ×4 IMPLANT
DECANTER SPIKE VIAL GLASS SM (MISCELLANEOUS) ×4 IMPLANT
DERMABOND ADVANCED (GAUZE/BANDAGES/DRESSINGS) ×1
DERMABOND ADVANCED .7 DNX12 (GAUZE/BANDAGES/DRESSINGS) ×3 IMPLANT
DRAPE C-ARM FOLDED MOBILE STRL (DRAPES) ×4 IMPLANT
DRAPE CHEST BREAST 15X10 FENES (DRAPES) ×4 IMPLANT
ELECT REM PT RETURN 9FT ADLT (ELECTROSURGICAL) ×4
ELECTRODE REM PT RTRN 9FT ADLT (ELECTROSURGICAL) ×3 IMPLANT
GAUZE 4X4 16PLY RFD (DISPOSABLE) ×4 IMPLANT
GAUZE SPONGE 4X4 12PLY STRL (GAUZE/BANDAGES/DRESSINGS) ×4 IMPLANT
GLOVE SS BIOGEL STRL SZ 7.5 (GLOVE) ×3 IMPLANT
GLOVE SUPERSENSE BIOGEL SZ 7.5 (GLOVE) ×1
GLOVE SURG UNDER POLY LF SZ7 (GLOVE) ×12 IMPLANT
GOWN STRL REUS W/TWL LRG LVL3 (GOWN DISPOSABLE) ×16 IMPLANT
IV NS 500ML (IV SOLUTION) ×4
IV NS 500ML BAXH (IV SOLUTION) ×3 IMPLANT
KIT BLADEGUARD II DBL (SET/KITS/TRAYS/PACK) ×4 IMPLANT
KIT TURNOVER KIT A (KITS) ×4 IMPLANT
MANIFOLD NEPTUNE II (INSTRUMENTS) ×4 IMPLANT
MARKER SKIN DUAL TIP RULER LAB (MISCELLANEOUS) ×8 IMPLANT
NEEDLE 22X1 1/2 (OR ONLY) (NEEDLE) IMPLANT
NEEDLE HYPO 18GX1.5 BLUNT FILL (NEEDLE) ×4 IMPLANT
NEEDLE HYPO 25GX1X1/2 BEV (NEEDLE) ×4 IMPLANT
NS IRRIG 1000ML POUR BTL (IV SOLUTION) ×4 IMPLANT
PACK CV ACCESS (CUSTOM PROCEDURE TRAY) ×4 IMPLANT
PACK SURGICAL SETUP 50X90 (CUSTOM PROCEDURE TRAY) ×4 IMPLANT
PAD ARMBOARD 7.5X6 YLW CONV (MISCELLANEOUS) ×8 IMPLANT
SET BASIN LINEN APH (SET/KITS/TRAYS/PACK) ×4 IMPLANT
SOAP 2 % CHG 4 OZ (WOUND CARE) ×4 IMPLANT
SOL PREP PROV IODINE SCRUB 4OZ (MISCELLANEOUS) ×4 IMPLANT
SUT ETHILON 3 0 PS 1 (SUTURE) ×4 IMPLANT
SUT PROLENE 6 0 CC (SUTURE) ×4 IMPLANT
SUT VIC AB 3-0 SH 27 (SUTURE) ×4
SUT VIC AB 3-0 SH 27X BRD (SUTURE) ×3 IMPLANT
SUT VICRYL 4-0 PS2 18IN ABS (SUTURE) ×4 IMPLANT
SYR 10ML LL (SYRINGE) ×4 IMPLANT
SYR 5ML LL (SYRINGE) ×8 IMPLANT
SYR BULB IRRIG 60ML STRL (SYRINGE) ×4 IMPLANT
SYR CONTROL 10ML LL (SYRINGE) ×4 IMPLANT
TOWEL GREEN STERILE (TOWEL DISPOSABLE) ×4 IMPLANT
UNDERPAD 30X36 HEAVY ABSORB (UNDERPADS AND DIAPERS) ×4 IMPLANT

## 2020-08-02 NOTE — Anesthesia Procedure Notes (Signed)
Date/Time: 08/02/2020 9:50 AM Performed by: Vista Deck, CRNA Pre-anesthesia Checklist: Patient identified, Emergency Drugs available, Suction available, Timeout performed and Patient being monitored Patient Re-evaluated:Patient Re-evaluated prior to induction Oxygen Delivery Method: Nasal Cannula

## 2020-08-02 NOTE — Anesthesia Postprocedure Evaluation (Signed)
Anesthesia Post Note  Patient: Ronald [name]  Procedure(s) Performed: INSERTION OF RIGHT TUNNELED INTERNAL JUGULAR  DIALYSIS CATHETER (Right) CANCELLED PROCEDURE-(LEFT ARM ARTERIOVENOUS FISTULA VERSUS GRAFT)  Patient location during evaluation: PACU Anesthesia Type: General Level of consciousness: awake and alert and oriented Pain management: pain level controlled Vital Signs Assessment: post-procedure vital signs reviewed and stable Respiratory status: spontaneous breathing and respiratory function stable Cardiovascular status: blood pressure returned to baseline and stable Postop Assessment: no apparent nausea or vomiting Anesthetic complications: no Comments: Episodes of atrial fib in pre op EKG, discussed with Dr. Donnetta Hutching, AV fistula was cancelled and dialysis catheter was inserted, patient will get cardiology evaluation before AV fistula procedure.   No notable events documented.   Last Vitals:  Vitals:   08/02/20 1130 08/02/20 1150  BP: (!) 199/86 (!) 194/83  Pulse: (!) 53 60  Resp: 13 18  Temp:  36.6 C  SpO2: 99% 96%    Last Pain:  Vitals:   08/02/20 1150  TempSrc: Oral  PainSc: 0-No pain                 [name] C Patrizia Paule

## 2020-08-02 NOTE — Interval H&P Note (Signed)
History and Physical Interval Note:  08/02/2020 9:06 AM  Ronald [name]  has presented today for surgery, with the diagnosis of CHRONIC KIDNEY DISEASE STAGE IV.  The various methods of treatment have been discussed with the patient and family. After consideration of risks, benefits and other options for treatment, the patient has consented to  Procedure(s): LEFT ARM ARTERIOVENOUS (AV) FISTULA VERSUS GRAFT CREATION (Left) INSERTION OF TUNNELED DIALYSIS CATHETER (N/A) as a surgical intervention.  The patient's history has been reviewed, patient examined, no change in status, stable for surgery.  I have reviewed the patient's chart and labs.  Questions were answered to the patient's satisfaction.     Curt Jews

## 2020-08-02 NOTE — Transfer of Care (Signed)
Immediate Anesthesia Transfer of Care Note  Patient: Ronald [name]  Procedure(s) Performed: INSERTION OF TUNNELED DIALYSIS CATHETER  Patient Location: PACU  Anesthesia Type:General  Level of Consciousness: awake and patient cooperative  Airway & Oxygen Therapy: Patient Spontanous Breathing and Patient connected to nasal cannula oxygen  Post-op Assessment: Report given to RN and Post -op Vital signs reviewed and stable  Post vital signs: Reviewed and stable  Last Vitals:  Vitals Value Taken Time  BP 182/86 08/02/20 1104  Temp    Pulse 49 08/02/20 1105  Resp 13 08/02/20 1106  SpO2 81 % 08/02/20 1105  Vitals shown include unvalidated device data.  Last Pain:  Vitals:   08/02/20 0844  TempSrc: Oral  PainSc: 0-No pain         Complications: No notable events documented.

## 2020-08-02 NOTE — Anesthesia Preprocedure Evaluation (Addendum)
Anesthesia Evaluation  Patient identified by MRN, date of birth, ID band Patient awake    Reviewed: Allergy & Precautions, NPO status , Patient's Chart, lab work & pertinent test results, reviewed documented beta blocker date and time   History of Anesthesia Complications Negative for: history of anesthetic complications  Airway Mallampati: II  TM Distance: >3 FB Neck ROM: Full    Dental  (+) Dental Advisory Given, Missing, Chipped, Poor Dentition   Pulmonary former smoker (100 pack years),    Pulmonary exam normal breath sounds clear to auscultation       Cardiovascular Exercise Tolerance: Good hypertension, Pt. on medications and Pt. on home beta blockers + Past MI, + CABG and + Peripheral Vascular Disease (AAA repair)  + dysrhythmias  Rhythm:Irregular Rate:Normal - Systolic murmurs, - Diastolic murmurs, - Friction Rub, - Carotid Bruit, - Peripheral Edema and - Systolic Click 1. Left ventricular ejection fraction, by estimation, is 55 to 60%. The left ventricle has normal function. The left ventricle has no regional wall motion abnormalities. There is mild concentric left ventricular hypertrophy. Left ventricular diastolic parameters are consistent with Grade II diastolic dysfunction (pseudonormalization).  2. Right ventricular systolic function is low normal. The right ventricular size is normal. There is normal pulmonary artery systolic pressure.  3. Left atrial size was mild to moderately dilated.  4. The mitral valve is grossly normal. Trivial mitral valve  regurgitation.  5. The aortic valve is tricuspid. Aortic valve regurgitation is mild. No aortic stenosis is present.  6. The inferior vena cava is dilated in size with >50% respiratory variability, suggesting right atrial pressure of 8 mmHg.   02-Aug-2020 08:47:38 [facility] ROUTINE RECORD [phone] (67 yr) Male Caucasian Vent. rate 74 BPM PR  interval * ms QRS duration 130 ms QT/QTcB 458/508 ms P-R-T axes * -28 93 Atrial fibrillation with premature ventricular or aberrantly conducted complexes Left ventricular hypertrophy with QRS widening ( R in aVL , Cornell product ) Cannot rule out Septal infarct , age undetermined Abnormal ECG   Neuro/Psych CVA, No Residual Symptoms negative psych ROS   GI/Hepatic GERD  Controlled,  Endo/Other  negative endocrine ROS  Renal/GU ESRFRenal disease Bladder dysfunction (bladder tumor)  prostate cancer    Musculoskeletal negative musculoskeletal ROS (+)   Abdominal   Peds  Hematology negative hematology ROS (+)   Anesthesia Other Findings   Reproductive/Obstetrics negative OB ROS                        Anesthesia Physical Anesthesia Plan  ASA: 4  Anesthesia Plan: General   Post-op Pain Management:    Induction: Intravenous  PONV Risk Score and Plan: Propofol infusion and Ondansetron  Airway Management Planned: Nasal Cannula and Natural Airway  Additional Equipment:   Intra-op Plan:   Post-operative Plan:   Informed Consent: I have reviewed the patients History and Physical, chart, labs and discussed the procedure including the risks, benefits and alternatives for the proposed anesthesia with the patient or authorized representative who has indicated his/her understanding and acceptance.     Dental advisory given  Plan Discussed with: CRNA and Surgeon  Anesthesia Plan Comments:        Anesthesia Quick Evaluation

## 2020-08-02 NOTE — Telephone Encounter (Signed)
   [facility] Pre-operative Risk Assessment    Request for surgical clearance:  What type of surgery is being performed?  Hemodialysis Catheter Placement  When is this surgery scheduled?  BTD-ASAP   What type of clearance is required (medical clearance vs. Pharmacy clearance to hold med vs. Both)?  Both   Are there any medications that need to be held prior to surgery and how long? Their office is requesting our recommendation.   Practice name and name of physician performing surgery?  [facility]  Dr. Sherren Mocha Early   What is your office phone number? [phone]    7.   What is your office fax number? [phone]  8.   Anesthesia type (None, local, MAC, general) ?   Jeani Hawking states she is unsure. She states the patient was at their office having the procedure when he developed cardiac arrhythmia. She states the anesthesiologist would not continue the procedure without clearance.   Zara Council 08/02/2020, 12:27 PM  _________________________________________________________________

## 2020-08-02 NOTE — Op Note (Signed)
    OPERATIVE REPORT  DATE OF SURGERY: 08/02/2020  PATIENT: Ronald [name], 80 y.o. male MRN: [id number]  DOB: 02-03-1941  PRE-OPERATIVE DIAGNOSIS: End-stage renal disease  POST-OPERATIVE DIAGNOSIS:  Same  PROCEDURE: Right tunneled IJ hemodialysis catheter with SonoSite visualization  SURGEON:  Curt Jews, M.D.  PHYSICIAN ASSISTANT: Nurse  The assistant was needed for exposure and to expedite the case  ANESTHESIA: Local with sedation  EBL: per anesthesia record  Total I/O In: 300 [I.V.:200; IV Piggyback:100] Out: 50 [Blood:50]  BLOOD ADMINISTERED: none  DRAINS: none  SPECIMEN: none  COUNTS CORRECT:  YES  PATIENT DISPOSITION:  PACU - hemodynamically stable  PROCEDURE DETAILS: The patient was taken operating placed supine position.  He was noted to have moderate to severe hypertension.  He did have some new rapid ventricular response A. fib.  Does have a history of sinus rhythm with occasional atrial fibrillation.  Anesthesia felt it was best to not proceed with a fistula but just placed dialysis catheter at this time and then proceed with fistula after further work-up.  The right and left neck and chest were prepped and draped you sterile fashion.  Patient was placed in Trendelenburg position and using SonoSite visualization the right internal jugular vein was accessed.  A guidewire was passed centrally and this was confirmed with fluoroscopy.  The dilator and peel-away sheath was passed over the guidewire and the dilator and peel-away sheath were removed.  A 23 cm tunneled hemodialysis catheter was passed through the peel-away sheath which was removed.  The catheter had been brought through the subcutaneous tissue through a separate stab incision in the infraclavicular area.  The catheter tips were placed at the level of the distal right atrium.  The catheter aspirated and flushed easily and were locked with 1000 unit/cc heparin.  The catheter was secured to the skin  with a 3-0 nylon stitch.  The entry site was closed with a 4-0 subcuticular Vicryl stitch.  The patient was transferred to the recovery room where chest x-ray is pending  Rosetta Posner, M.D., [facility] 08/02/2020 11:13 AM  Note: Portions of this report may have been transcribed using voice recognition software.  Every effort has been made to ensure accuracy; however, inadvertent computerized transcription errors may still be present.

## 2020-08-03 ENCOUNTER — Encounter (HOSPITAL_COMMUNITY): Payer: Self-pay | Admitting: Vascular Surgery

## 2020-08-03 DIAGNOSIS — N186 End stage renal disease: Secondary | ICD-10-CM | POA: Diagnosis not present

## 2020-08-03 DIAGNOSIS — Z992 Dependence on renal dialysis: Secondary | ICD-10-CM | POA: Diagnosis not present

## 2020-08-06 ENCOUNTER — Other Ambulatory Visit: Payer: Self-pay

## 2020-08-06 ENCOUNTER — Ambulatory Visit (INDEPENDENT_AMBULATORY_CARE_PROVIDER_SITE_OTHER): Payer: Medicare Other | Admitting: Physician Assistant

## 2020-08-06 ENCOUNTER — Encounter: Payer: Self-pay | Admitting: Physician Assistant

## 2020-08-06 VITALS — BP 148/78 | HR 64 | Ht 74.0 in | Wt 192.6 lb

## 2020-08-06 DIAGNOSIS — Z01818 Encounter for other preprocedural examination: Secondary | ICD-10-CM | POA: Diagnosis not present

## 2020-08-06 DIAGNOSIS — I10 Essential (primary) hypertension: Secondary | ICD-10-CM | POA: Diagnosis not present

## 2020-08-06 DIAGNOSIS — Z8679 Personal history of other diseases of the circulatory system: Secondary | ICD-10-CM | POA: Diagnosis not present

## 2020-08-06 DIAGNOSIS — Z9889 Other specified postprocedural states: Secondary | ICD-10-CM | POA: Diagnosis not present

## 2020-08-06 DIAGNOSIS — Z992 Dependence on renal dialysis: Secondary | ICD-10-CM | POA: Diagnosis not present

## 2020-08-06 DIAGNOSIS — E782 Mixed hyperlipidemia: Secondary | ICD-10-CM | POA: Diagnosis not present

## 2020-08-06 DIAGNOSIS — I25810 Atherosclerosis of coronary artery bypass graft(s) without angina pectoris: Secondary | ICD-10-CM | POA: Diagnosis not present

## 2020-08-06 DIAGNOSIS — N186 End stage renal disease: Secondary | ICD-10-CM

## 2020-08-06 MED ORDER — CARVEDILOL 6.25 MG PO TABS: 6.2500 mg | ORAL_TABLET | Freq: Two times a day (BID) | ORAL | 3 refills | Status: DC

## 2020-08-06 NOTE — Patient Instructions (Signed)
Medication Instructions:   INCREASE Coreg to 6.25 mg TWICE A DAY  *If you need a refill on your cardiac medications before your next appointment, please call your pharmacy*   Lab Work: None today  If you have labs (blood work) drawn today and your tests are completely normal, you will receive your results only by: MyChart Message (if you have MyChart) OR A paper copy in the mail If you have any lab test that is abnormal or we need to change your treatment, we will call you to review the results.   Testing/Procedures: Your physician has requested that you have en exercise stress myoview. For further information please visit [email or web address]. Please follow instruction sheet, as given.    Follow-Up: At [facility], you and your health needs are our priority.  As part of our continuing mission to provide you with exceptional heart care, we have created designated Provider Care Teams.  These Care Teams include your primary Cardiologist (physician) and Advanced Practice Providers (APPs -  Physician Assistants and Nurse Practitioners) who all work together to provide you with the care you need, when you need it.  We recommend signing up for the patient portal called "MyChart".  Sign up information is provided on this After Visit Summary.  MyChart is used to connect with patients for Virtual Visits (Telemedicine).  Patients are able to view lab/test results, encounter notes, upcoming appointments, etc.  Non-urgent messages can be sent to your provider as well.   To learn more about what you can do with MyChart, go to [email or web address].    Your next appointment:   6 month(s)  The format for your next appointment:   In Person  Provider:   Jenkins Rouge, MD   Other Instructions None  Two Gram Sodium Diet 2000 mg  What is Sodium? Sodium is a mineral found naturally in many foods. The most significant source of sodium in the diet is table salt, which is about 40% sodium.   Processed, convenience, and preserved foods also contain a large amount of sodium.  The body needs only 500 mg of sodium daily to function,  A normal diet provides more than enough sodium even if you do not use salt.  Why Limit Sodium? A build up of sodium in the body can cause thirst, increased blood pressure, shortness of breath, and water retention.  Decreasing sodium in the diet can reduce edema and risk of heart attack or stroke associated with high blood pressure.  Keep in mind that there are many other factors involved in these health problems.  Heredity, obesity, lack of exercise, cigarette smoking, stress and what you eat all play a role.  General Guidelines: Do not add salt at the table or in cooking.  One teaspoon of salt contains over 2 grams of sodium. Read food labels Avoid processed and convenience foods Ask your dietitian before eating any foods not dicussed in the menu planning guidelines Consult your physician if you wish to use a salt substitute or a sodium containing medication such as antacids.  Limit milk and milk products to 16 oz (2 cups) per day.  Shopping Hints: READ LABELS!! "Dietetic" does not necessarily mean low sodium. Salt and other sodium ingredients are often added to foods during processing.    Menu Planning Guidelines Food Group Choose More Often Avoid  Beverages (see also the milk group All fruit juices, low-sodium, salt-free vegetables juices, low-sodium carbonated beverages Regular vegetable or tomato juices, commercially softened water used for drinking or cooking  Breads and Cereals Enriched white, wheat, rye and pumpernickel bread, hard rolls and dinner rolls; muffins, cornbread and waffles; most dry cereals, cooked cereal without added salt; unsalted crackers and breadsticks; low sodium or homemade bread crumbs Bread, rolls and crackers with salted tops; quick breads; instant hot cereals; pancakes; commercial bread stuffing; self-rising flower and  biscuit mixes; regular bread crumbs or cracker crumbs  Desserts and Sweets Desserts and sweets mad with mild should be within allowance Instant pudding mixes and cake mixes  Fats Butter or margarine; vegetable oils; unsalted salad dressings, regular salad dressings limited to 1 Tbs; light, sour and heavy cream Regular salad dressings containing bacon fat, bacon bits, and salt pork; snack dips made with instant soup mixes or processed cheese; salted nuts  Fruits Most fresh, frozen and canned fruits Fruits processed with salt or sodium-containing ingredient (some dried fruits are processed with sodium sulfites        Vegetables Fresh, frozen vegetables and low- sodium canned vegetables Regular canned vegetables, sauerkraut, pickled vegetables, and others prepared in brine; frozen vegetables in sauces; vegetables seasoned with ham, bacon or salt pork  Condiments, Sauces, Miscellaneous  Salt substitute with physician's approval; pepper, herbs, spices; vinegar, lemon or lime juice; hot pepper sauce; garlic powder, onion powder, low sodium soy sauce (1 Tbs.); low sodium condiments (ketchup, chili sauce, mustard) in limited amounts (1 tsp.) fresh ground horseradish; unsalted tortilla chips, pretzels, potato chips, popcorn, salsa (1/4 cup) Any seasoning made with salt including garlic salt, celery salt, onion salt, and seasoned salt; sea salt, rock salt, kosher salt; meat tenderizers; monosodium glutamate; mustard, regular soy sauce, barbecue, sauce, chili sauce, teriyaki sauce, steak sauce, Worcestershire sauce, and most flavored vinegars; canned gravy and mixes; regular condiments; salted snack foods, olives, picles, relish, horseradish sauce, catsup   Food preparation: Try these seasonings Meats:    Pork Sage, onion Serve with applesauce  Chicken Poultry seasoning, thyme, parsley Serve with cranberry sauce  Lamb Curry powder, rosemary, garlic, thyme Serve with mint sauce or jelly  Veal Marjoram, basil  Serve with current jelly, cranberry sauce  Beef Pepper, bay leaf Serve with dry mustard, unsalted chive butter  Fish [place] leaf, dill Serve with unsalted lemon butter, unsalted parsley butter  Vegetables:    Asparagus Lemon juice   Broccoli Lemon juice   Carrots Mustard dressing parsley, mint, nutmeg, glazed with unsalted butter and sugar   Green beans Marjoram, lemon juice, nutmeg,dill seed   Tomatoes Basil, marjoram, onion   Spice /blend for [employer]" 4 tsp ground thyme 1 tsp ground sage 3 tsp ground rosemary 4 tsp ground marjoram   Test your knowledge A product that says "Salt Free" may still contain sodium. True or False Garlic Powder and Hot Pepper Sauce an be used as alternative seasonings.True or False Processed foods have more sodium than fresh foods.  True or False Canned Vegetables have less sodium than froze True or False   WAYS TO DECREASE YOUR SODIUM INTAKE Avoid the use of added salt in cooking and at the table.  Table salt (and other prepared seasonings which contain salt) is probably one of the greatest sources of sodium in the diet.  Unsalted foods can gain flavor from the sweet, sour, and butter taste sensations of herbs and spices.  Instead of using salt for seasoning, try the following seasonings with the foods listed.  Remember: how you use them to enhance natural food flavors is limited only by your creativity... Allspice-Meat, fish, eggs, fruit, peas, red and  yellow vegetables Almond Extract-Fruit baked goods Anise Seed-Sweet breads, fruit, carrots, beets, cottage cheese, cookies (tastes like licorice) Basil-Meat, fish, eggs, vegetables, rice, vegetables salads, soups, sauces Bay Leaf-Meat, fish, stews, poultry Burnet-Salad, vegetables (cucumber-like flavor) Caraway Seed-Bread, cookies, cottage cheese, meat, vegetables, cheese, rice Cardamon-Baked goods, fruit, soups Celery Powder or seed-Salads, salad dressings, sauces, meatloaf, soup, bread.Do not use  celery  salt Chervil-Meats, salads, fish, eggs, vegetables, cottage cheese (parsley-like flavor) Chili Power-Meatloaf, chicken cheese, corn, eggplant, egg dishes Chives-Salads cottage cheese, egg dishes, soups, vegetables, sauces Cilantro-Salsa, casseroles Cinnamon-Baked goods, fruit, pork, lamb, chicken, carrots Cloves-Fruit, baked goods, fish, pot roast, green beans, beets, carrots Coriander-Pastry, cookies, meat, salads, cheese (lemon-orange flavor) Cumin-Meatloaf, fish,cheese, eggs, cabbage,fruit pie (caraway flavor) [employer], fruit, eggs, fish, poultry, cottage cheese, vegetables Dill Seed-Meat, cottage cheese, poultry, vegetables, fish, salads, bread Fennel Seed-Bread, cookies, apples, pork, eggs, fish, beets, cabbage, cheese, Licorice-like flavor Garlic-(buds or powder) Salads, meat, poultry, fish, bread, butter, vegetables, potatoes.Do not  use garlic salt Ginger-Fruit, vegetables, baked goods, meat, fish, poultry Horseradish Root-Meet, vegetables, butter Lemon Juice or Extract-Vegetables, fruit, tea, baked goods, fish salads Mace-Baked goods fruit, vegetables, fish, poultry (taste like nutmeg) Maple Extract-Syrups Marjoram-Meat, chicken, fish, vegetables, breads, green salads (taste like Sage) Mint-Tea, lamb, sherbet, vegetables, desserts, carrots, cabbage Mustard, Dry or Seed-Cheese, eggs, meats, vegetables, poultry Nutmeg-Baked goods, fruit, chicken, eggs, vegetables, desserts Onion Powder-Meat, fish, poultry, vegetables, cheese, eggs, bread, rice salads (Do not use   Onion salt) Orange Extract-Desserts, baked goods Oregano-Pasta, eggs, cheese, onions, pork, lamb, fish, chicken, vegetables, green salads Paprika-Meat, fish, poultry, eggs, cheese, vegetables Parsley Flakes-Butter, vegetables, meat fish, poultry, eggs, bread, salads (certain forms may   Contain sodium Pepper-Meat fish, poultry, vegetables, eggs Peppermint Extract-Desserts, baked goods Poppy Seed-Eggs, bread,  cheese, fruit dressings, baked goods, noodles, vegetables, cottage  [employer], poultry, meat, fish, cauliflower, turnips,eggs bread Saffron-Rice, bread, veal, chicken, fish, eggs Sage-Meat, fish, poultry, onions, eggplant, tomateos, pork, stews Savory-Eggs, salads, poultry, meat, rice, vegetables, soups, pork Tarragon-Meat, poultry, fish, eggs, butter, vegetables (licorice-like flavor)  Thyme-Meat, poultry, fish, eggs, vegetables, (clover-like flavor), sauces, soups Tumeric-Salads, butter, eggs, fish, rice, vegetables (saffron-like flavor) Vanilla Extract-Baked goods, candy Vinegar-Salads, vegetables, meat marinades Walnut Extract-baked goods, candy   2. Choose your Foods Wisely   The following is a list of foods to avoid which are high in sodium:  Meats-Avoid all smoked, canned, salt cured, dried and kosher meat and fish as well as Anchovies   [employer] meats:Bologna, Liverwurst, Pastrami Canned meat or fish  Marinated herring Caviar    Pepperoni Corned Beef   Pizza Dried chipped beef  Salami Frozen breaded fish or meat Salt pork Frankfurters or hot dogs  Sardines Gefilte fish   Sausage Ham (boiled ham, Proscuitto Smoked butt    spiced ham)   Spam      TV Dinners Vegetables Canned vegetables (Regular) Relish Canned mushrooms  Sauerkraut Olives    Tomato juice Pickles  Bakery and Dessert Products Canned puddings  Cream pies Cheesecake   Decorated cakes Cookies  Beverages/Juices Tomato juice, regular  Gatorade   V-8 vegetable juice, regular  Breads and Cereals Biscuit mixes   Salted potato chips, corn chips, pretzels Bread stuffing mixes  Salted crackers and rolls Pancake and waffle mixes Self-rising flour  Seasonings Accent    Meat sauces Barbecue sauce  Meat tenderizer Catsup    Monosodium glutamate (MSG) Celery salt   Onion salt Chili sauce   Prepared mustard Garlic salt  Salt, seasoned salt, sea  salt Gravy mixes   Soy sauce Horseradish   Steak sauce Ketchup   Tartar sauce Lite salt    Teriyaki sauce Marinade mixes   Worcestershire sauce  Others Baking powder   Cocoa and cocoa mixes Baking soda   Commercial casserole mixes Candy-caramels, chocolate  Dehydrated soups    Bars, fudge,nougats  Instant rice and pasta mixes Canned broth or soup  Maraschino cherries Cheese, aged and processed cheese and cheese spreads  Learning Assessment Quiz  Indicated T (for True) or F (for False) for each of the following statements:  _____ Fresh fruits and vegetables and unprocessed grains are generally low in sodium _____ Water may contain a considerable amount of sodium, depending on the source _____ You can always tell if a food is high in sodium by tasting it _____ Certain laxatives my be high in sodium and should be avoided unless prescribed   by a physician or pharmacist _____ Salt substitutes may be used freely by anyone on a sodium restricted diet _____ Sodium is present in table salt, food additives and as a natural component of   most foods _____ Table salt is approximately 90% sodium _____ Limiting sodium intake may help prevent excess fluid accumulation in the body _____ On a sodium-restricted diet, seasonings such as bouillon soy sauce, and    cooking wine should be used in place of table salt _____ On an ingredient list, a product which lists monosodium glutamate as the first   ingredient is an appropriate food to include on a low sodium diet  Circle the best answer(s) to the following statements (Hint: there may be more than one correct answer)  11. On a low-sodium diet, some acceptable snack items are:    A. Olives  F. Bean dip   K. Grapefruit juice    B. Salted Pretzels G. Commercial Popcorn   L. Canned peaches    C. Carrot Sticks  H. Bouillon   M. Unsalted nuts   D. [place] fries  I. Peanut butter crackers N. Salami   E. Sweet pickles J. Tomato Juice   O. Pizza  12.   Seasonings that may be used freely on a reduced - sodium diet include   A. Lemon wedges F.Monosodium glutamate K. Celery seed    B.Soysauce   G. Pepper   L. Mustard powder   C. Sea salt  H. Cooking wine  M. Onion flakes   D. Vinegar  E. Prepared horseradish N. Salsa   E. Sage   J. Worcestershire sauce  O. Chutney

## 2020-08-06 NOTE — Progress Notes (Signed)
Cardiology Office Note    Date:  08/06/2020   ID:  Ronald [name], DOB 1941-02-01, MRN [id number]   PCP:  Celene Squibb, MD   [place]  Cardiologist:  Jenkins Rouge, MD  Advanced Practice Provider:  No care team member to display Electrophysiologist:  None   [phone]   No chief complaint on file.   History of Present Illness:  Ronald [name] is a 80 y.o. male with history of hypertension, hyperlipidemia, CKD, CAD status post CABG 1999 cath 2009 LIMA patent, SVG to the circumflex patent and native circumflex to RCA collaterals with total occlusion of native coronary arteries.  All done in [place]. Had a mini stroke during a procedure in [place] as well.  Had AAA repair 01/29/2012 op note reported small type II endoleak at end of procedure.  Patient was seen for the first time by Dr. Johnsie Cancel 07/2016 and exercise Myoview ordered because of likely need for dialysis and surgery for fistula.  Patient had hypertensive response to exercise prior scar no ischemia LVEF 56% low risk study.  2D echo 05/11/2019 LVEF 55 to 60% grade 2 DD  Patient referred for preoperative clearance before undergoing hemodialysis catheter placement by Dr. Curt Jews. Patient and wife say his BP was 198 when they put in the temporary catheter so they wanted him to be seen here. Today it was 128. Usually 140-150/80-90. Patient was walking 1 mile three times a day but since dog passed away he hasn't walked in 4 months. Denies chest pain, dyspnea, dizziness, presyncope, edema.   Past Medical History:  Diagnosis Date   GERD (gastroesophageal reflux disease)    Hypertension    Myocardial infarction [facility])    1999   Prostate cancer ([place]) 02/2018   Renal insufficiency    Stroke [facility])    no deficits; had stroke while trying to place "stents in legs".    Past Surgical History:  Procedure Laterality Date   ABDOMINAL AORTIC ANEURYSM REPAIR  2012   IN [place]   APPENDECTOMY     CORONARY  ARTERY BYPASS GRAFT  01/1998   3 vessels 18 years ago   INGUINAL HERNIA REPAIR Right 09/20/2015   Procedure: REPAIR RIGHT INGUINAL HERNIA  WITH MESH;  Surgeon: Vickie Epley, MD;  Location: [facility];  Service: General;  Laterality: Right;   INSERTION OF DIALYSIS CATHETER Right 08/02/2020   Procedure: INSERTION OF RIGHT TUNNELED INTERNAL JUGULAR  DIALYSIS CATHETER;  Surgeon: Rosetta Posner, MD;  Location: [facility];  Service: Vascular;  Laterality: Right;   tumor removal from bladder      Current Medications: Current Meds  Medication Sig   amLODipine (NORVASC) 5 MG tablet Take 5 mg by mouth daily.   ascorbic acid (VITAMIN C) 1000 MG tablet Take 1,000 mg by mouth 2 (two) times daily.   atorvastatin (LIPITOR) 80 MG tablet Take 80 mg by mouth daily.   calcium elemental as carbonate (TUMS ULTRA 1000) 400 MG chewable tablet Chew 1,000 mg by mouth in the morning and at bedtime.   carvedilol (COREG) 6.25 MG tablet Take 1 tablet (6.25 mg total) by mouth 2 (two) times daily.   furosemide (LASIX) 40 MG tablet Take 40 mg by mouth daily.    tamsulosin (FLOMAX) 0.4 MG CAPS capsule Take 1 capsule (0.4 mg total) by mouth daily after supper.   traMADol (ULTRAM) 50 MG tablet Take 50 mg by mouth every 6 (six) hours as needed for moderate pain.  zolpidem (AMBIEN) 10 MG tablet Take 10 mg by mouth at bedtime as needed for sleep.   [DISCONTINUED] carvedilol (COREG) 6.25 MG tablet Take 6.25 mg by mouth in the morning.     Allergies:   Patient has no known allergies.   Social History   Socioeconomic History   Marital status: Married    Spouse name: Not on file   Number of children: Not on file   Years of education: Not on file   Highest education level: Not on file  Occupational History   Not on file  Tobacco Use   Smoking status: Former    Packs/day: 2.00    Years: 50.00    Pack years: 100.00    Types: Cigarettes    Start date: 08/07/1957   Smokeless tobacco: Never   Tobacco comments:    on chantix  now  Vaping Use   Vaping Use: Never used  Substance and Sexual Activity   Alcohol use: No   Drug use: No   Sexual activity: Yes  Other Topics Concern   Not on file  Social History Narrative   Not on file   Social Determinants of Health   Financial Resource Strain: Not on file  Food Insecurity: Not on file  Transportation Needs: Not on file  Physical Activity: Not on file  Stress: Not on file  Social Connections: Not on file     Family History:  The patient's  family history includes Cancer in his father.   ROS:   Please see the history of present illness.    ROS All other systems reviewed and are negative.   PHYSICAL EXAM:   VS:  BP (!) 148/78   Pulse 64   Ht 6\' 2"  (1.88 m)   Wt 192 lb 9.6 oz (87.4 kg)   SpO2 98%   BMI 24.73 kg/m   Physical Exam  GEN: Well nourished, well developed, in no acute distress  Neck: no JVD, carotid bruits, or masses Cardiac:RRR; no murmurs, rubs, or gallops  Respiratory:  clear to auscultation bilaterally, normal work of breathing GI: soft, nontender, nondistended, + BS Ext: without cyanosis, clubbing, or edema, Good distal pulses bilaterally Neuro:  Alert and Oriented x 3, Psych: euthymic mood, full affect  Wt Readings from Last 3 Encounters:  08/06/20 192 lb 9.6 oz (87.4 kg)  08/02/20 197 lb 15.6 oz (89.8 kg)  07/16/20 198 lb (89.8 kg)      Studies/Labs Reviewed:   EKG:  EKG is not ordered today.  The ekg reviewed from 08/02/20 NSR with LVH, PVC's, old septal infarct unchanged.  Recent Labs: 08/10/2019: Platelets 160 08/02/2020: BUN 76; Creatinine, Ser 7.70; Hemoglobin 11.6; Potassium 3.8; Sodium 139   Lipid Panel No results found for: CHOL, TRIG, HDL, CHOLHDL, VLDL, LDLCALC, LDLDIRECT  Additional studies/ records that were reviewed today include:  2D echo 05/11/2019 IMPRESSIONS     1. Left ventricular ejection fraction, by estimation, is 55 to 60%. The  left ventricle has normal function. The left ventricle has no  regional  wall motion abnormalities. There is mild concentric left ventricular  hypertrophy. Left ventricular diastolic  parameters are consistent with Grade II diastolic dysfunction  (pseudonormalization).   2. Right ventricular systolic function is low normal. The right  ventricular size is normal. There is normal pulmonary artery systolic  pressure.   3. Left atrial size was mild to moderately dilated.   4. The mitral valve is grossly normal. Trivial mitral valve  regurgitation.   5. The  aortic valve is tricuspid. Aortic valve regurgitation is mild. No  aortic stenosis is present.   6. The inferior vena cava is dilated in size with >50% respiratory  variability, suggesting right atrial pressure of 8 mmHg.   NST 2018 Blood pressure demonstrated a hypertensive response to exercise. There was no ST segment deviation noted during stress. Defect 1: There is a medium defect of mild severity present in the basal inferior, mid inferior and apical inferior location. This appears to be due to myocardial scar. No ischemic territories. This is a low risk study. Nuclear stress EF: 56%.   Abdominal CT 09/04/2017 IMPRESSION: 1. No lymphadenopathy or other findings of metastatic disease in the pelvis. 2. Moderately enlarged prostate. 3. Nonspecific mild diffuse bladder wall thickening, probably due to chronic bladder outlet obstruction by the enlarged prostate. 4. Moderate left inguinal hernia contains a portion of the proximal sigmoid colon, with no evidence of bowel complication. 5.  Aortic Atherosclerosis (ICD10-I70.0). 6. Partially visualized abdominal aortic aneurysm status post aorto bi-iliac stent graft repair.     Electronically Signed   By: Ilona Sorrel M.D.   On: 09/04/2017 16:11   Risk Assessment/Calculations:         ASSESSMENT:    1. Preoperative clearance   2. Coronary artery disease involving coronary bypass graft of native heart without angina pectoris   3. ESRD  (end stage renal disease) ([place])   4. S/P AAA (abdominal aortic aneurysm) repair   5. Essential hypertension   6. Mixed hyperlipidemia      PLAN:  In order of problems listed above:  Preoperative clearance before undergoing fistula placement for ESRD and hemodialysis patient with long history of CAD as discussed below.  Last stress test 2018 low risk, echo 2021 normal LVEF 55 to 60% with grade 2 DD.  Patient was walking a mile 3 times a day up until 4 months ago when his dog passed.  He denies cardiac symptoms.  Bypass surgery was over 23 years ago.  RCRI is 11% so we will order exercise Myoview to rule out ischemia.  Are elevated and will increase carvedilol to 6.25 mg twice daily.  According to the Revised Cardiac Risk Index (RCRI), his Perioperative Risk of Major Cardiac Event is (%): 11  His Functional Capacity in METs is: 7.59 according to the [facility] Activity Status Index (DASI).   CAD status post CABG 1999 cath 2009 LIMA patent, SVG to the circumflex patent and native circumflex to RCA collaterals with total occlusion of native coronary arteries.  Cardiac cath years ago with aborted intervention because of CVA during procedure.  Low risk NST 2018.  No angina.  Exercise Myoview for preop clearance  ESRD with temporary catheter and had 1 HD treatment so far. Waiting for AV fistula  History of AAA endovascular repair with endoleak/residual AAA last CT 2019 and for renal abdominal aortic aneurysm measuring at least 7.0 cm partially visualized aortobiiliac stent graft.  Needs to be followed by Dr. Donnetta Hutching  Hypertension blood pressure was elevated during procedure for temporary catheter placement and is up some at home.  2 g sodium diet.  Increase carvedilol.  Can also increase amlodipine.  Suspect his blood pressure will drop on hemodialysis days.  Hyperlipidemia on Lipitor managed by PCP  Shared Decision Making/Informed Consent   Shared Decision Making/Informed Consent The risks [chest  pain, shortness of breath, cardiac arrhythmias, dizziness, blood pressure fluctuations, myocardial infarction, stroke/transient ischemic attack, nausea, vomiting, allergic reaction, radiation exposure, metallic taste sensation and  life-threatening complications (estimated to be 1 in 10,000)], benefits (risk stratification, diagnosing coronary artery disease, treatment guidance) and alternatives of a nuclear stress test were discussed in detail with Mr. Hengst and he agrees to proceed.    Medication Adjustments/Labs and Tests Ordered: Current medicines are reviewed at length with the patient today.  Concerns regarding medicines are outlined above.  Medication changes, Labs and Tests ordered today are listed in the Patient Instructions below. Patient Instructions  Medication Instructions:   INCREASE Coreg to 6.25 mg TWICE A DAY  *If you need a refill on your cardiac medications before your next appointment, please call your pharmacy*   Lab Work: None today  If you have labs (blood work) drawn today and your tests are completely normal, you will receive your results only by: [place] (if you have MyChart) OR A paper copy in the mail If you have any lab test that is abnormal or we need to change your treatment, we will call you to review the results.   Testing/Procedures: Your physician has requested that you have en exercise stress myoview. For further information please visit [email or web address]. Please follow instruction sheet, as given.    Follow-Up: At [facility], you and your health needs are our priority.  As part of our continuing mission to provide you with exceptional heart care, we have created designated Provider Care Teams.  These Care Teams include your primary Cardiologist (physician) and Advanced Practice Providers (APPs -  Physician Assistants and Nurse Practitioners) who all work together to provide you with the care you need, when you need it.  We recommend  signing up for the patient portal called "MyChart".  Sign up information is provided on this After Visit Summary.  MyChart is used to connect with patients for Virtual Visits (Telemedicine).  Patients are able to view lab/test results, encounter notes, upcoming appointments, etc.  Non-urgent messages can be sent to your provider as well.   To learn more about what you can do with MyChart, go to [email or web address].    Your next appointment:   6 month(s)  The format for your next appointment:   In Person  Provider:   Jenkins Rouge, MD   Other Instructions None  Two Gram Sodium Diet 2000 mg  What is Sodium? Sodium is a mineral found naturally in many foods. The most significant source of sodium in the diet is table salt, which is about 40% sodium.  Processed, convenience, and preserved foods also contain a large amount of sodium.  The body needs only 500 mg of sodium daily to function,  A normal diet provides more than enough sodium even if you do not use salt.  Why Limit Sodium? A build up of sodium in the body can cause thirst, increased blood pressure, shortness of breath, and water retention.  Decreasing sodium in the diet can reduce edema and risk of heart attack or stroke associated with high blood pressure.  Keep in mind that there are many other factors involved in these health problems.  Heredity, obesity, lack of exercise, cigarette smoking, stress and what you eat all play a role.  General Guidelines: Do not add salt at the table or in cooking.  One teaspoon of salt contains over 2 grams of sodium. Read food labels Avoid processed and convenience foods Ask your dietitian before eating any foods not dicussed in the menu planning guidelines Consult your physician if you wish to use a salt substitute or a sodium containing medication such as antacids.  Limit milk and milk products to 16 oz (2 cups) per day.  Shopping Hints: READ LABELS!! "Dietetic" does not necessarily mean low  sodium. Salt and other sodium ingredients are often added to foods during processing.    Menu Planning Guidelines Food Group Choose More Often Avoid  Beverages (see also the milk group All fruit juices, low-sodium, salt-free vegetables juices, low-sodium carbonated beverages Regular vegetable or tomato juices, commercially softened water used for drinking or cooking  Breads and Cereals Enriched white, wheat, rye and pumpernickel bread, hard rolls and dinner rolls; muffins, cornbread and waffles; most dry cereals, cooked cereal without added salt; unsalted crackers and breadsticks; low sodium or homemade bread crumbs Bread, rolls and crackers with salted tops; quick breads; instant hot cereals; pancakes; commercial bread stuffing; self-rising flower and biscuit mixes; regular bread crumbs or cracker crumbs  Desserts and Sweets Desserts and sweets mad with mild should be within allowance Instant pudding mixes and cake mixes  Fats Butter or margarine; vegetable oils; unsalted salad dressings, regular salad dressings limited to 1 Tbs; light, sour and heavy cream Regular salad dressings containing bacon fat, bacon bits, and salt pork; snack dips made with instant soup mixes or processed cheese; salted nuts  Fruits Most fresh, frozen and canned fruits Fruits processed with salt or sodium-containing ingredient (some dried fruits are processed with sodium sulfites        Vegetables Fresh, frozen vegetables and low- sodium canned vegetables Regular canned vegetables, sauerkraut, pickled vegetables, and others prepared in brine; frozen vegetables in sauces; vegetables seasoned with ham, bacon or salt pork  Condiments, Sauces, Miscellaneous  Salt substitute with physician's approval; pepper, herbs, spices; vinegar, lemon or lime juice; hot pepper sauce; garlic powder, onion powder, low sodium soy sauce (1 Tbs.); low sodium condiments (ketchup, chili sauce, mustard) in limited amounts (1 tsp.) fresh ground  horseradish; unsalted tortilla chips, pretzels, potato chips, popcorn, salsa (1/4 cup) Any seasoning made with salt including garlic salt, celery salt, onion salt, and seasoned salt; sea salt, rock salt, kosher salt; meat tenderizers; monosodium glutamate; mustard, regular soy sauce, barbecue, sauce, chili sauce, teriyaki sauce, steak sauce, Worcestershire sauce, and most flavored vinegars; canned gravy and mixes; regular condiments; salted snack foods, olives, picles, relish, horseradish sauce, catsup   Food preparation: Try these seasonings Meats:    Pork Sage, onion Serve with applesauce  Chicken Poultry seasoning, thyme, parsley Serve with cranberry sauce  Lamb Curry powder, rosemary, garlic, thyme Serve with mint sauce or jelly  Veal Marjoram, basil Serve with current jelly, cranberry sauce  Beef Pepper, bay leaf Serve with dry mustard, unsalted chive butter  Fish [place] leaf, dill Serve with unsalted lemon butter, unsalted parsley butter  Vegetables:    Asparagus Lemon juice   Broccoli Lemon juice   Carrots Mustard dressing parsley, mint, nutmeg, glazed with unsalted butter and sugar   Green beans Marjoram, lemon juice, nutmeg,dill seed   Tomatoes Basil, marjoram, onion   Spice /blend for [employer]" 4 tsp ground thyme 1 tsp ground sage 3 tsp ground rosemary 4 tsp ground marjoram   Test your knowledge A product that says "Salt Free" may still contain sodium. True or False Garlic Powder and Hot Pepper Sauce an be used as alternative seasonings.True or False Processed foods have more sodium than fresh foods.  True or False Canned Vegetables have less sodium than froze True or False   WAYS TO DECREASE YOUR SODIUM INTAKE Avoid the use of added salt in cooking and at the table.  Table salt (and other prepared seasonings which contain salt) is probably one of the greatest sources of sodium in the diet.  Unsalted foods can gain flavor from the sweet, sour, and butter taste sensations of  herbs and spices.  Instead of using salt for seasoning, try the following seasonings with the foods listed.  Remember: how you use them to enhance natural food flavors is limited only by your creativity... Allspice-Meat, fish, eggs, fruit, peas, red and yellow vegetables Almond Extract-Fruit baked goods Anise Seed-Sweet breads, fruit, carrots, beets, cottage cheese, cookies (tastes like licorice) Basil-Meat, fish, eggs, vegetables, rice, vegetables salads, soups, sauces Bay Leaf-Meat, fish, stews, poultry Burnet-Salad, vegetables (cucumber-like flavor) Caraway Seed-Bread, cookies, cottage cheese, meat, vegetables, cheese, rice Cardamon-Baked goods, fruit, soups Celery Powder or seed-Salads, salad dressings, sauces, meatloaf, soup, bread.Do not use  celery salt Chervil-Meats, salads, fish, eggs, vegetables, cottage cheese (parsley-like flavor) Chili Power-Meatloaf, chicken cheese, corn, eggplant, egg dishes Chives-Salads cottage cheese, egg dishes, soups, vegetables, sauces Cilantro-Salsa, casseroles Cinnamon-Baked goods, fruit, pork, lamb, chicken, carrots Cloves-Fruit, baked goods, fish, pot roast, green beans, beets, carrots Coriander-Pastry, cookies, meat, salads, cheese (lemon-orange flavor) Cumin-Meatloaf, fish,cheese, eggs, cabbage,fruit pie (caraway flavor) [employer], fruit, eggs, fish, poultry, cottage cheese, vegetables Dill Seed-Meat, cottage cheese, poultry, vegetables, fish, salads, bread Fennel Seed-Bread, cookies, apples, pork, eggs, fish, beets, cabbage, cheese, Licorice-like flavor Garlic-(buds or powder) Salads, meat, poultry, fish, bread, butter, vegetables, potatoes.Do not  use garlic salt Ginger-Fruit, vegetables, baked goods, meat, fish, poultry Horseradish Root-Meet, vegetables, butter Lemon Juice or Extract-Vegetables, fruit, tea, baked goods, fish salads Mace-Baked goods fruit, vegetables, fish, poultry (taste like nutmeg) Maple Extract-Syrups Marjoram-Meat,  chicken, fish, vegetables, breads, green salads (taste like Sage) Mint-Tea, lamb, sherbet, vegetables, desserts, carrots, cabbage Mustard, Dry or Seed-Cheese, eggs, meats, vegetables, poultry Nutmeg-Baked goods, fruit, chicken, eggs, vegetables, desserts Onion Powder-Meat, fish, poultry, vegetables, cheese, eggs, bread, rice salads (Do not use   Onion salt) Orange Extract-Desserts, baked goods Oregano-Pasta, eggs, cheese, onions, pork, lamb, fish, chicken, vegetables, green salads Paprika-Meat, fish, poultry, eggs, cheese, vegetables Parsley Flakes-Butter, vegetables, meat fish, poultry, eggs, bread, salads (certain forms may   Contain sodium Pepper-Meat fish, poultry, vegetables, eggs Peppermint Extract-Desserts, baked goods Poppy Seed-Eggs, bread, cheese, fruit dressings, baked goods, noodles, vegetables, cottage  [employer], poultry, meat, fish, cauliflower, turnips,eggs bread Saffron-Rice, bread, veal, chicken, fish, eggs Sage-Meat, fish, poultry, onions, eggplant, tomateos, pork, stews Savory-Eggs, salads, poultry, meat, rice, vegetables, soups, pork Tarragon-Meat, poultry, fish, eggs, butter, vegetables (licorice-like flavor)  Thyme-Meat, poultry, fish, eggs, vegetables, (clover-like flavor), sauces, soups Tumeric-Salads, butter, eggs, fish, rice, vegetables (saffron-like flavor) Vanilla Extract-Baked goods, candy Vinegar-Salads, vegetables, meat marinades Walnut Extract-baked goods, candy   2. Choose your Foods Wisely   The following is a list of foods to avoid which are high in sodium:  Meats-Avoid all smoked, canned, salt cured, dried and kosher meat and fish as well as Anchovies   [employer] meats:Bologna, Liverwurst, Pastrami Canned meat or fish  Marinated herring Caviar    Pepperoni Corned Beef   Pizza Dried chipped beef  Salami Frozen breaded fish or meat Salt pork Frankfurters or hot dogs  Sardines Gefilte  fish   Sausage Ham (boiled ham, Proscuitto Smoked butt    spiced ham)   Spam      TV Dinners Vegetables Canned vegetables (Regular) Relish Canned mushrooms  Sauerkraut Olives    Tomato juice Pickles  Bakery and Dessert Products Canned puddings  Cream pies Cheesecake   Decorated  cakes Cookies  Beverages/Juices Tomato juice, regular  Gatorade   V-8 vegetable juice, regular  Breads and Cereals Biscuit mixes   Salted potato chips, corn chips, pretzels Bread stuffing mixes  Salted crackers and rolls Pancake and waffle mixes Self-rising flour  Seasonings Accent    Meat sauces Barbecue sauce  Meat tenderizer Catsup    Monosodium glutamate (MSG) Celery salt   Onion salt Chili sauce   Prepared mustard Garlic salt   Salt, seasoned salt, sea salt Gravy mixes   Soy sauce Horseradish   Steak sauce Ketchup   Tartar sauce Lite salt    Teriyaki sauce Marinade mixes   Worcestershire sauce  Others Baking powder   Cocoa and cocoa mixes Baking soda   Commercial casserole mixes Candy-caramels, chocolate  Dehydrated soups    Bars, fudge,nougats  Instant rice and pasta mixes Canned broth or soup  Maraschino cherries Cheese, aged and processed cheese and cheese spreads  Learning Assessment Quiz  Indicated T (for True) or F (for False) for each of the following statements:  _____ Fresh fruits and vegetables and unprocessed grains are generally low in sodium _____ Water may contain a considerable amount of sodium, depending on the source _____ You can always tell if a food is high in sodium by tasting it _____ Certain laxatives my be high in sodium and should be avoided unless prescribed   by a physician or pharmacist _____ Salt substitutes may be used freely by anyone on a sodium restricted diet _____ Sodium is present in table salt, food additives and as a natural component of   most foods _____ Table salt is approximately 90% sodium _____ Limiting sodium intake may help prevent  excess fluid accumulation in the body _____ On a sodium-restricted diet, seasonings such as bouillon soy sauce, and    cooking wine should be used in place of table salt _____ On an ingredient list, a product which lists monosodium glutamate as the first   ingredient is an appropriate food to include on a low sodium diet  Circle the best answer(s) to the following statements (Hint: there may be more than one correct answer)  11. On a low-sodium diet, some acceptable snack items are:    A. Olives  F. Bean dip   K. Grapefruit juice    B. Salted Pretzels G. Commercial Popcorn   L. Canned peaches    C. Carrot Sticks  H. Bouillon   M. Unsalted nuts   D. [place] fries  I. Peanut butter crackers N. Salami   E. Sweet pickles J. Tomato Juice   O. Pizza  12.  Seasonings that may be used freely on a reduced - sodium diet include   A. Lemon wedges F.Monosodium glutamate K. Celery seed    B.Soysauce   G. Pepper   L. Mustard powder   C. Sea salt  H. Cooking wine  M. Onion flakes   D. Vinegar  E. Prepared horseradish N. Salsa   E. Sage   J. Worcestershire sauce  O. Chutney    Signed, Ermalinda Barrios, PA-C  08/06/2020 1:41 PM    [facility] [place], [place], [place]  [address] Phone: [phone]; Fax: [phone]

## 2020-08-08 ENCOUNTER — Telehealth: Payer: Self-pay | Admitting: *Deleted

## 2020-08-08 DIAGNOSIS — N186 End stage renal disease: Secondary | ICD-10-CM | POA: Diagnosis not present

## 2020-08-08 DIAGNOSIS — Z992 Dependence on renal dialysis: Secondary | ICD-10-CM | POA: Diagnosis not present

## 2020-08-08 MED ORDER — CARVEDILOL 12.5 MG PO TABS: 12.5000 mg | ORAL_TABLET | Freq: Two times a day (BID) | ORAL | 3 refills | Status: DC

## 2020-08-08 NOTE — Telephone Encounter (Signed)
-----   Message from Imogene Burn, PA-C sent at 08/08/2020  2:55 PM EDT ----- Regarding: RE: Coreg Ok thanks, let's increase it to 12.5 mg one tablet twice a day and let [facility] he was only taking 6.25 mg bid thanks ----- Message ----- From: Levonne Hubert, LPN Sent: 2/62/0355   2:50 PM EDT To: Imogene Burn, PA-C Subject: RE: Coreg                                      Spoke with pt's wife who states that the patient is given a 12.5 tablet the they cut in half. So he takes 1/2 tablet 2 times daily.  ----- Message ----- From: Imogene Burn, PA-C Sent: 08/08/2020   1:55 PM EDT To: Levonne Hubert, LPN Subject: RE: Coreg                                      Can you call his pharmacy and patient to verify? If he was on 12.5 mg bid increase to 25 mg bid but that's not what they told us at [place]. Thanks, Selinda Eon ----- Message ----- From: Levonne Hubert, LPN Sent: 9/74/1638   1:16 PM EDT To: Imogene Burn, PA-C Subject: Coreg                                          Received a fax from [facility] stating to confirm the correct dose of Carvedilol. It was sent in with a note stating increase to 6.25 mg BID. They state that the patient was on 12.5 mg BID and that this was a decrease. Please advise.

## 2020-08-08 NOTE — Telephone Encounter (Signed)
Spoke with wife and instructions given to increase Coreg to 12.5 mg Two Times Daily.

## 2020-08-09 DIAGNOSIS — N186 End stage renal disease: Secondary | ICD-10-CM | POA: Diagnosis not present

## 2020-08-09 DIAGNOSIS — Z992 Dependence on renal dialysis: Secondary | ICD-10-CM | POA: Diagnosis not present

## 2020-08-10 DIAGNOSIS — Z992 Dependence on renal dialysis: Secondary | ICD-10-CM | POA: Diagnosis not present

## 2020-08-10 DIAGNOSIS — N186 End stage renal disease: Secondary | ICD-10-CM | POA: Diagnosis not present

## 2020-08-13 DIAGNOSIS — N186 End stage renal disease: Secondary | ICD-10-CM | POA: Diagnosis not present

## 2020-08-13 DIAGNOSIS — Z992 Dependence on renal dialysis: Secondary | ICD-10-CM | POA: Diagnosis not present

## 2020-08-14 ENCOUNTER — Telehealth: Payer: Self-pay

## 2020-08-14 NOTE — Telephone Encounter (Signed)
Nuclear Med. Dept. Called and stated patient's test this week was canceled due to patient not able to do a Exercise myoview due to having dialysis on Mondays, Wednesdays, and Fridays and feeling too tired to go test. Will send message to Ermalinda Barrios PA to see if patient's test can be switched to [facility].

## 2020-08-15 DIAGNOSIS — N2581 Secondary hyperparathyroidism of renal origin: Secondary | ICD-10-CM | POA: Diagnosis not present

## 2020-08-15 DIAGNOSIS — N186 End stage renal disease: Secondary | ICD-10-CM | POA: Diagnosis not present

## 2020-08-15 DIAGNOSIS — Z992 Dependence on renal dialysis: Secondary | ICD-10-CM | POA: Diagnosis not present

## 2020-08-15 NOTE — Telephone Encounter (Signed)
I spoke with Thurmond Butts in nuclear medicine.He states Mrs.Griego just called and said Mr.Ronald [name] does not want lexiscan either. He is too tired on the days he doesn't have dialysis. Thurmond Butts is going to cancel appointment tomorrow.    I will [place] [facility]

## 2020-08-15 NOTE — Telephone Encounter (Signed)
Order has already been changed to [place].

## 2020-08-16 ENCOUNTER — Encounter (HOSPITAL_COMMUNITY): Payer: Medicare Other

## 2020-08-16 ENCOUNTER — Ambulatory Visit (HOSPITAL_COMMUNITY): Payer: Medicare Other

## 2020-08-17 ENCOUNTER — Telehealth: Payer: Self-pay

## 2020-08-17 DIAGNOSIS — N186 End stage renal disease: Secondary | ICD-10-CM | POA: Diagnosis not present

## 2020-08-17 DIAGNOSIS — Z992 Dependence on renal dialysis: Secondary | ICD-10-CM | POA: Diagnosis not present

## 2020-08-17 NOTE — Telephone Encounter (Signed)
Noted pt cancelled stress test scheduled for 08/16/20. Spoke with pt and advised test will be needed for cardiac clearance in order to proceed with left arm fistula placement. Pt verbalized understanding and will contact cardiology office to reschedule.

## 2020-08-20 ENCOUNTER — Telehealth: Payer: Self-pay | Admitting: Cardiovascular Disease

## 2020-08-20 DIAGNOSIS — Z992 Dependence on renal dialysis: Secondary | ICD-10-CM | POA: Diagnosis not present

## 2020-08-20 DIAGNOSIS — N2581 Secondary hyperparathyroidism of renal origin: Secondary | ICD-10-CM | POA: Diagnosis not present

## 2020-08-20 DIAGNOSIS — Z01818 Encounter for other preprocedural examination: Secondary | ICD-10-CM

## 2020-08-20 DIAGNOSIS — D509 Iron deficiency anemia, unspecified: Secondary | ICD-10-CM | POA: Diagnosis not present

## 2020-08-20 DIAGNOSIS — I25810 Atherosclerosis of coronary artery bypass graft(s) without angina pectoris: Secondary | ICD-10-CM

## 2020-08-20 DIAGNOSIS — N186 End stage renal disease: Secondary | ICD-10-CM | POA: Diagnosis not present

## 2020-08-20 NOTE — Telephone Encounter (Signed)
New message:     Patient wife calling to speak with a nurse concering patient up coming appt. Patient need a Nuclear stress test instead of the treadmill. Please advise.

## 2020-08-20 NOTE — Telephone Encounter (Signed)
Wife calling to request that pt is scheduled for a nuclear stress test. Wife states that pt is unable to walk on the treadmill. She states she would like the provider to decide on the test. Please advise.

## 2020-08-21 NOTE — Telephone Encounter (Signed)
Wife notified that test has been changed to [place]. Wife voiced understanding.

## 2020-08-22 ENCOUNTER — Ambulatory Visit: Payer: Medicare Other | Admitting: Vascular Surgery

## 2020-08-22 ENCOUNTER — Other Ambulatory Visit: Payer: Medicare Other

## 2020-08-22 DIAGNOSIS — Z992 Dependence on renal dialysis: Secondary | ICD-10-CM | POA: Diagnosis not present

## 2020-08-22 DIAGNOSIS — N186 End stage renal disease: Secondary | ICD-10-CM | POA: Diagnosis not present

## 2020-08-24 DIAGNOSIS — Z992 Dependence on renal dialysis: Secondary | ICD-10-CM | POA: Diagnosis not present

## 2020-08-24 DIAGNOSIS — N186 End stage renal disease: Secondary | ICD-10-CM | POA: Diagnosis not present

## 2020-08-27 DIAGNOSIS — N186 End stage renal disease: Secondary | ICD-10-CM | POA: Diagnosis not present

## 2020-08-27 DIAGNOSIS — Z992 Dependence on renal dialysis: Secondary | ICD-10-CM | POA: Diagnosis not present

## 2020-08-29 DIAGNOSIS — N186 End stage renal disease: Secondary | ICD-10-CM | POA: Diagnosis not present

## 2020-08-29 DIAGNOSIS — Z992 Dependence on renal dialysis: Secondary | ICD-10-CM | POA: Diagnosis not present

## 2020-08-31 DIAGNOSIS — Z992 Dependence on renal dialysis: Secondary | ICD-10-CM | POA: Diagnosis not present

## 2020-08-31 DIAGNOSIS — N186 End stage renal disease: Secondary | ICD-10-CM | POA: Diagnosis not present

## 2020-09-03 DIAGNOSIS — N186 End stage renal disease: Secondary | ICD-10-CM | POA: Diagnosis not present

## 2020-09-03 DIAGNOSIS — N2581 Secondary hyperparathyroidism of renal origin: Secondary | ICD-10-CM | POA: Diagnosis not present

## 2020-09-03 DIAGNOSIS — Z992 Dependence on renal dialysis: Secondary | ICD-10-CM | POA: Diagnosis not present

## 2020-09-05 DIAGNOSIS — Z992 Dependence on renal dialysis: Secondary | ICD-10-CM | POA: Diagnosis not present

## 2020-09-05 DIAGNOSIS — N2581 Secondary hyperparathyroidism of renal origin: Secondary | ICD-10-CM | POA: Diagnosis not present

## 2020-09-05 DIAGNOSIS — N186 End stage renal disease: Secondary | ICD-10-CM | POA: Diagnosis not present

## 2020-09-07 DIAGNOSIS — N186 End stage renal disease: Secondary | ICD-10-CM | POA: Diagnosis not present

## 2020-09-07 DIAGNOSIS — Z992 Dependence on renal dialysis: Secondary | ICD-10-CM | POA: Diagnosis not present

## 2020-09-07 DIAGNOSIS — N2581 Secondary hyperparathyroidism of renal origin: Secondary | ICD-10-CM | POA: Diagnosis not present

## 2020-09-09 DIAGNOSIS — N186 End stage renal disease: Secondary | ICD-10-CM | POA: Diagnosis not present

## 2020-09-09 DIAGNOSIS — Z992 Dependence on renal dialysis: Secondary | ICD-10-CM | POA: Diagnosis not present

## 2020-09-10 DIAGNOSIS — Z992 Dependence on renal dialysis: Secondary | ICD-10-CM | POA: Diagnosis not present

## 2020-09-10 DIAGNOSIS — N186 End stage renal disease: Secondary | ICD-10-CM | POA: Diagnosis not present

## 2020-09-12 ENCOUNTER — Encounter (HOSPITAL_COMMUNITY)
Admission: RE | Admit: 2020-09-12 | Discharge: 2020-09-12 | Disposition: A | Discharge status: Home / Self Care | Payer: Medicare Other | Source: Ambulatory Visit | Attending: Physician Assistant | Admitting: Physician Assistant

## 2020-09-12 ENCOUNTER — Telehealth: Payer: Self-pay | Admitting: *Deleted

## 2020-09-12 ENCOUNTER — Ambulatory Visit (HOSPITAL_COMMUNITY)
Admission: RE | Admit: 2020-09-12 | Discharge: 2020-09-12 | Disposition: A | Discharge status: Home / Self Care | Payer: Medicare Other | Source: Ambulatory Visit | Attending: Physician Assistant | Admitting: Physician Assistant

## 2020-09-12 ENCOUNTER — Other Ambulatory Visit: Payer: Self-pay

## 2020-09-12 DIAGNOSIS — Z01818 Encounter for other preprocedural examination: Secondary | ICD-10-CM | POA: Diagnosis not present

## 2020-09-12 DIAGNOSIS — Z992 Dependence on renal dialysis: Secondary | ICD-10-CM | POA: Diagnosis not present

## 2020-09-12 DIAGNOSIS — Z01810 Encounter for preprocedural cardiovascular examination: Secondary | ICD-10-CM | POA: Diagnosis not present

## 2020-09-12 DIAGNOSIS — N186 End stage renal disease: Secondary | ICD-10-CM | POA: Diagnosis not present

## 2020-09-12 DIAGNOSIS — I25810 Atherosclerosis of coronary artery bypass graft(s) without angina pectoris: Secondary | ICD-10-CM | POA: Insufficient documentation

## 2020-09-12 DIAGNOSIS — I10 Essential (primary) hypertension: Secondary | ICD-10-CM

## 2020-09-12 LAB — NM MYOCAR MULTI W/SPECT W/WALL MOTION / EF
LV dias vol: 156 mL (ref 62–150)
LV sys vol: 108 mL
Peak HR: 133 {beats}/min
RATE: 0.37
Rest HR: 73 {beats}/min
SDS: 2
SRS: 9
SSS: 11
TID: 1.26

## 2020-09-12 MED ORDER — TECHNETIUM TC 99M TETROFOSMIN IV KIT
10.0000 | PACK | Freq: Once | INTRAVENOUS | Status: AC | PRN
  Administered 2020-09-12: 10.8 via INTRAVENOUS

## 2020-09-12 MED ORDER — REGADENOSON 0.4 MG/5ML IV SOLN
INTRAVENOUS | Status: AC
  Administered 2020-09-12: 0.4 mg via INTRAVENOUS
  Filled 2020-09-12: qty 5

## 2020-09-12 MED ORDER — TECHNETIUM TC 99M TETROFOSMIN IV KIT
30.0000 | PACK | Freq: Once | INTRAVENOUS | Status: AC | PRN
  Administered 2020-09-12: 30 via INTRAVENOUS

## 2020-09-12 MED ORDER — SODIUM CHLORIDE FLUSH 0.9 % IV SOLN
INTRAVENOUS | Status: AC
  Administered 2020-09-12: 10 mL via INTRAVENOUS
  Filled 2020-09-12: qty 10

## 2020-09-12 NOTE — Telephone Encounter (Signed)
-----   Message from Imogene Burn, PA-C sent at 09/12/2020  3:35 PM EDT ----- Stress test the patient had burst of SVT and atrial tachycardia.  He should be on carvedilol 6.25 mg twice daily but [place] indicated that he was not on this.  Can you verify and ask him if he is taking it and to please take it if he is not.  Heart function 31% Dr. Domenic Polite recommends he get a 2D echo to further assess his heart function.  Please order ASAP as this patient is waiting for clearance.  Thank you

## 2020-09-12 NOTE — Telephone Encounter (Signed)
Wife notified of plan of care and voiced understanding

## 2020-09-13 ENCOUNTER — Ambulatory Visit (HOSPITAL_COMMUNITY)
Admission: RE | Admit: 2020-09-13 | Discharge: 2020-09-13 | Disposition: A | Discharge status: Home / Self Care | Payer: Medicare Other | Source: Ambulatory Visit | Attending: Physician Assistant | Admitting: Physician Assistant

## 2020-09-13 DIAGNOSIS — Z01818 Encounter for other preprocedural examination: Secondary | ICD-10-CM | POA: Insufficient documentation

## 2020-09-13 DIAGNOSIS — I351 Nonrheumatic aortic (valve) insufficiency: Secondary | ICD-10-CM

## 2020-09-13 DIAGNOSIS — I10 Essential (primary) hypertension: Secondary | ICD-10-CM | POA: Diagnosis not present

## 2020-09-13 LAB — ECHOCARDIOGRAM COMPLETE
Area-P 1/2: 4.19 cm2
P 1/2 time: 545 msec
S' Lateral: 4.2 cm
Single Plane A4C EF: 46 %

## 2020-09-13 NOTE — Progress Notes (Signed)
  Echocardiogram 2D Echocardiogram has been performed.  Ronald [name] 09/13/2020, 11:15 AM

## 2020-09-14 DIAGNOSIS — Z992 Dependence on renal dialysis: Secondary | ICD-10-CM | POA: Diagnosis not present

## 2020-09-14 DIAGNOSIS — N186 End stage renal disease: Secondary | ICD-10-CM | POA: Diagnosis not present

## 2020-09-17 DIAGNOSIS — N2581 Secondary hyperparathyroidism of renal origin: Secondary | ICD-10-CM | POA: Diagnosis not present

## 2020-09-17 DIAGNOSIS — Z992 Dependence on renal dialysis: Secondary | ICD-10-CM | POA: Diagnosis not present

## 2020-09-17 DIAGNOSIS — N186 End stage renal disease: Secondary | ICD-10-CM | POA: Diagnosis not present

## 2020-09-19 ENCOUNTER — Other Ambulatory Visit: Payer: Self-pay

## 2020-09-19 DIAGNOSIS — N186 End stage renal disease: Secondary | ICD-10-CM | POA: Diagnosis not present

## 2020-09-19 DIAGNOSIS — Z992 Dependence on renal dialysis: Secondary | ICD-10-CM | POA: Diagnosis not present

## 2020-09-21 DIAGNOSIS — Z992 Dependence on renal dialysis: Secondary | ICD-10-CM | POA: Diagnosis not present

## 2020-09-21 DIAGNOSIS — N186 End stage renal disease: Secondary | ICD-10-CM | POA: Diagnosis not present

## 2020-09-24 ENCOUNTER — Encounter (HOSPITAL_COMMUNITY): Payer: Medicare Other

## 2020-09-26 DIAGNOSIS — Z992 Dependence on renal dialysis: Secondary | ICD-10-CM | POA: Diagnosis not present

## 2020-09-26 DIAGNOSIS — N186 End stage renal disease: Secondary | ICD-10-CM | POA: Diagnosis not present

## 2020-09-28 DIAGNOSIS — Z992 Dependence on renal dialysis: Secondary | ICD-10-CM | POA: Diagnosis not present

## 2020-09-28 DIAGNOSIS — N186 End stage renal disease: Secondary | ICD-10-CM | POA: Diagnosis not present

## 2020-10-01 ENCOUNTER — Ambulatory Visit: Payer: Medicare Other | Admitting: Cardiovascular Disease

## 2020-10-01 DIAGNOSIS — Z992 Dependence on renal dialysis: Secondary | ICD-10-CM | POA: Diagnosis not present

## 2020-10-01 DIAGNOSIS — N186 End stage renal disease: Secondary | ICD-10-CM | POA: Diagnosis not present

## 2020-10-01 DIAGNOSIS — N2581 Secondary hyperparathyroidism of renal origin: Secondary | ICD-10-CM | POA: Diagnosis not present

## 2020-10-05 DIAGNOSIS — N186 End stage renal disease: Secondary | ICD-10-CM | POA: Diagnosis not present

## 2020-10-05 DIAGNOSIS — Z992 Dependence on renal dialysis: Secondary | ICD-10-CM | POA: Diagnosis not present

## 2020-10-05 DIAGNOSIS — N2581 Secondary hyperparathyroidism of renal origin: Secondary | ICD-10-CM | POA: Diagnosis not present

## 2020-10-08 DIAGNOSIS — N186 End stage renal disease: Secondary | ICD-10-CM | POA: Diagnosis not present

## 2020-10-08 DIAGNOSIS — Z992 Dependence on renal dialysis: Secondary | ICD-10-CM | POA: Diagnosis not present

## 2020-10-10 DIAGNOSIS — I10 Essential (primary) hypertension: Secondary | ICD-10-CM | POA: Diagnosis not present

## 2020-10-10 DIAGNOSIS — K219 Gastro-esophageal reflux disease without esophagitis: Secondary | ICD-10-CM | POA: Diagnosis not present

## 2020-10-10 DIAGNOSIS — N186 End stage renal disease: Secondary | ICD-10-CM | POA: Diagnosis not present

## 2020-10-10 DIAGNOSIS — Z992 Dependence on renal dialysis: Secondary | ICD-10-CM | POA: Diagnosis not present

## 2020-10-12 ENCOUNTER — Encounter (HOSPITAL_COMMUNITY)
Admission: RE | Admit: 2020-10-12 | Discharge: 2020-10-12 | Disposition: A | Discharge status: Home / Self Care | Payer: Medicare Other | Source: Ambulatory Visit | Attending: Vascular Surgery | Admitting: Vascular Surgery

## 2020-10-12 ENCOUNTER — Other Ambulatory Visit: Payer: Self-pay

## 2020-10-12 DIAGNOSIS — Z992 Dependence on renal dialysis: Secondary | ICD-10-CM | POA: Diagnosis not present

## 2020-10-12 DIAGNOSIS — N186 End stage renal disease: Secondary | ICD-10-CM | POA: Diagnosis not present

## 2020-10-15 DIAGNOSIS — Z992 Dependence on renal dialysis: Secondary | ICD-10-CM | POA: Diagnosis not present

## 2020-10-15 DIAGNOSIS — N186 End stage renal disease: Secondary | ICD-10-CM | POA: Diagnosis not present

## 2020-10-16 ENCOUNTER — Ambulatory Visit (HOSPITAL_COMMUNITY)
Admission: RE | Admit: 2020-10-16 | Discharge: 2020-10-16 | Disposition: A | Discharge status: Home / Self Care | Payer: Medicare Other | Attending: Vascular Surgery | Admitting: Vascular Surgery

## 2020-10-16 ENCOUNTER — Ambulatory Visit (HOSPITAL_COMMUNITY): Payer: Medicare Other | Admitting: Anesthesiology

## 2020-10-16 ENCOUNTER — Encounter (HOSPITAL_COMMUNITY)
Admission: RE | Disposition: A | Discharge status: Home / Self Care | Payer: Self-pay | Source: Home / Self Care | Attending: Vascular Surgery

## 2020-10-16 ENCOUNTER — Encounter (HOSPITAL_COMMUNITY): Payer: Self-pay | Admitting: Vascular Surgery

## 2020-10-16 DIAGNOSIS — I120 Hypertensive chronic kidney disease with stage 5 chronic kidney disease or end stage renal disease: Secondary | ICD-10-CM | POA: Insufficient documentation

## 2020-10-16 DIAGNOSIS — Z79899 Other long term (current) drug therapy: Secondary | ICD-10-CM | POA: Diagnosis not present

## 2020-10-16 DIAGNOSIS — F17210 Nicotine dependence, cigarettes, uncomplicated: Secondary | ICD-10-CM | POA: Diagnosis not present

## 2020-10-16 DIAGNOSIS — Z7902 Long term (current) use of antithrombotics/antiplatelets: Secondary | ICD-10-CM | POA: Insufficient documentation

## 2020-10-16 DIAGNOSIS — N186 End stage renal disease: Secondary | ICD-10-CM

## 2020-10-16 DIAGNOSIS — Z992 Dependence on renal dialysis: Secondary | ICD-10-CM

## 2020-10-16 HISTORY — PX: AV FISTULA PLACEMENT: SHX1204

## 2020-10-16 LAB — POCT I-STAT, CHEM 8
BUN: 35 mg/dL — ABNORMAL HIGH (ref 8–23)
Calcium, Ion: 0.93 mmol/L — ABNORMAL LOW (ref 1.15–1.40)
Chloride: 102 mmol/L (ref 98–111)
Creatinine, Ser: 6.5 mg/dL — ABNORMAL HIGH (ref 0.61–1.24)
Glucose, Bld: 89 mg/dL (ref 70–99)
HCT: 35 % — ABNORMAL LOW (ref 39.0–52.0)
Hemoglobin: 11.9 g/dL — ABNORMAL LOW (ref 13.0–17.0)
Potassium: 4.2 mmol/L (ref 3.5–5.1)
Sodium: 138 mmol/L (ref 135–145)
TCO2: 29 mmol/L (ref 22–32)

## 2020-10-16 SURGERY — ARTERIOVENOUS (AV) FISTULA CREATION
Anesthesia: General | Site: Arm Lower | Laterality: Left

## 2020-10-16 MED ORDER — FENTANYL CITRATE PF 50 MCG/ML IJ SOSY: 25.0000 ug | PREFILLED_SYRINGE | INTRAMUSCULAR | Status: DC | PRN

## 2020-10-16 MED ORDER — ONDANSETRON HCL 4 MG/2ML IJ SOLN: 4.0000 mg | Freq: Once | INTRAMUSCULAR | Status: DC | PRN

## 2020-10-16 MED ORDER — FENTANYL CITRATE (PF) 100 MCG/2ML IJ SOLN
INTRAMUSCULAR | Status: DC | PRN
  Administered 2020-10-16: 50 ug via INTRAVENOUS

## 2020-10-16 MED ORDER — MIDAZOLAM HCL 5 MG/5ML IJ SOLN
INTRAMUSCULAR | Status: DC | PRN
  Administered 2020-10-16: 1 mg via INTRAVENOUS

## 2020-10-16 MED ORDER — HEPARIN 6000 UNIT IRRIGATION SOLUTION
Status: DC | PRN
  Administered 2020-10-16: 1

## 2020-10-16 MED ORDER — 0.9 % SODIUM CHLORIDE (POUR BTL) OPTIME
TOPICAL | Status: DC | PRN
  Administered 2020-10-16: 1000 mL

## 2020-10-16 MED ORDER — KETAMINE HCL 10 MG/ML IJ SOLN
INTRAMUSCULAR | Status: DC | PRN
  Administered 2020-10-16 (×6): 5 mg via INTRAVENOUS

## 2020-10-16 MED ORDER — PROPOFOL 10 MG/ML IV BOLUS
INTRAVENOUS | Status: AC
  Filled 2020-10-16: qty 40

## 2020-10-16 MED ORDER — CHLORHEXIDINE GLUCONATE 4 % EX LIQD: 60.0000 mL | Freq: Once | CUTANEOUS | Status: DC

## 2020-10-16 MED ORDER — PROPOFOL 500 MG/50ML IV EMUL
INTRAVENOUS | Status: DC | PRN
  Administered 2020-10-16: 50 ug/kg/min via INTRAVENOUS

## 2020-10-16 MED ORDER — CEFAZOLIN SODIUM-DEXTROSE 2-4 GM/100ML-% IV SOLN
2.0000 g | INTRAVENOUS | Status: AC
  Administered 2020-10-16: 2 g via INTRAVENOUS

## 2020-10-16 MED ORDER — CHLORHEXIDINE GLUCONATE 0.12 % MT SOLN
15.0000 mL | Freq: Once | OROMUCOSAL | Status: AC
  Administered 2020-10-16: 15 mL via OROMUCOSAL

## 2020-10-16 MED ORDER — FENTANYL CITRATE (PF) 100 MCG/2ML IJ SOLN
INTRAMUSCULAR | Status: AC
  Filled 2020-10-16: qty 2

## 2020-10-16 MED ORDER — LIDOCAINE HCL (PF) 2 % IJ SOLN
INTRAMUSCULAR | Status: AC
  Filled 2020-10-16: qty 5

## 2020-10-16 MED ORDER — DIPHENHYDRAMINE HCL 50 MG/ML IJ SOLN
INTRAMUSCULAR | Status: AC
  Filled 2020-10-16: qty 1

## 2020-10-16 MED ORDER — MIDAZOLAM HCL 2 MG/2ML IJ SOLN
INTRAMUSCULAR | Status: AC
  Filled 2020-10-16: qty 2

## 2020-10-16 MED ORDER — HEPARIN SODIUM (PORCINE) 1000 UNIT/ML IJ SOLN
INTRAMUSCULAR | Status: AC
  Filled 2020-10-16: qty 6

## 2020-10-16 MED ORDER — LIDOCAINE-EPINEPHRINE 0.5 %-1:200000 IJ SOLN
INTRAMUSCULAR | Status: AC
  Filled 2020-10-16: qty 1

## 2020-10-16 MED ORDER — KETAMINE HCL 50 MG/5ML IJ SOSY
PREFILLED_SYRINGE | INTRAMUSCULAR | Status: AC
  Filled 2020-10-16: qty 5

## 2020-10-16 MED ORDER — CEFAZOLIN SODIUM-DEXTROSE 2-4 GM/100ML-% IV SOLN
INTRAVENOUS | Status: AC
  Filled 2020-10-16: qty 100

## 2020-10-16 MED ORDER — SODIUM CHLORIDE 0.9 % IV SOLN: INTRAVENOUS | Status: DC

## 2020-10-16 MED ORDER — ORAL CARE MOUTH RINSE: 15.0000 mL | Freq: Once | OROMUCOSAL | Status: AC

## 2020-10-16 MED ORDER — DIPHENHYDRAMINE HCL 50 MG/ML IJ SOLN
INTRAMUSCULAR | Status: DC | PRN
  Administered 2020-10-16: 6.25 mg via INTRAVENOUS

## 2020-10-16 MED ORDER — LIDOCAINE-EPINEPHRINE 0.5 %-1:200000 IJ SOLN
INTRAMUSCULAR | Status: DC | PRN
  Administered 2020-10-16: 5 mL

## 2020-10-16 MED ORDER — SODIUM CHLORIDE 0.9 % IV SOLN
INTRAVENOUS | Status: DC
  Administered 2020-10-16: 1000 mL via INTRAVENOUS

## 2020-10-16 SURGICAL SUPPLY — 39 items
ARMBAND PINK RESTRICT EXTREMIT (MISCELLANEOUS) ×2 IMPLANT
BAG HAMPER (MISCELLANEOUS) ×2 IMPLANT
CANNULA VESSEL 3MM 2 BLNT TIP (CANNULA) ×2 IMPLANT
CLIP LIGATING EXTRA MED SLVR (CLIP) ×2 IMPLANT
CLIP LIGATING EXTRA SM BLUE (MISCELLANEOUS) ×2 IMPLANT
COVER LIGHT HANDLE STERIS (MISCELLANEOUS) ×4 IMPLANT
COVER MAYO STAND XLG (MISCELLANEOUS) ×2 IMPLANT
COVER PROBE U/S 5X48 (MISCELLANEOUS) IMPLANT
DECANTER SPIKE VIAL GLASS SM (MISCELLANEOUS) ×2 IMPLANT
DERMABOND ADVANCED (GAUZE/BANDAGES/DRESSINGS) ×1
DERMABOND ADVANCED .7 DNX12 (GAUZE/BANDAGES/DRESSINGS) ×1 IMPLANT
ELECT REM PT RETURN 9FT ADLT (ELECTROSURGICAL) ×2
ELECTRODE REM PT RTRN 9FT ADLT (ELECTROSURGICAL) ×1 IMPLANT
GAUZE SPONGE 4X4 12PLY STRL (GAUZE/BANDAGES/DRESSINGS) ×2 IMPLANT
GLOVE SS BIOGEL STRL SZ 7.5 (GLOVE) ×1 IMPLANT
GLOVE SUPERSENSE BIOGEL SZ 7.5 (GLOVE) ×1
GLOVE SURG UNDER POLY LF SZ7 (GLOVE) ×6 IMPLANT
GOWN STRL REUS W/TWL LRG LVL3 (GOWN DISPOSABLE) ×6 IMPLANT
IV NS 500ML (IV SOLUTION) ×1
IV NS 500ML BAXH (IV SOLUTION) ×1 IMPLANT
KIT BLADEGUARD II DBL (SET/KITS/TRAYS/PACK) ×2 IMPLANT
KIT TURNOVER KIT A (KITS) ×2 IMPLANT
MANIFOLD NEPTUNE II (INSTRUMENTS) ×2 IMPLANT
MARKER SKIN DUAL TIP RULER LAB (MISCELLANEOUS) ×4 IMPLANT
NEEDLE HYPO 18GX1.5 BLUNT FILL (NEEDLE) ×2 IMPLANT
NS IRRIG 1000ML POUR BTL (IV SOLUTION) ×2 IMPLANT
PACK CV ACCESS (CUSTOM PROCEDURE TRAY) ×2 IMPLANT
PAD ARMBOARD 7.5X6 YLW CONV (MISCELLANEOUS) ×2 IMPLANT
SET BASIN LINEN APH (SET/KITS/TRAYS/PACK) ×2 IMPLANT
SOL PREP POV-IOD 4OZ 10% (MISCELLANEOUS) ×2 IMPLANT
SOL PREP PROV IODINE SCRUB 4OZ (MISCELLANEOUS) ×2 IMPLANT
SPONGE T-LAP 18X18 ~~LOC~~+RFID (SPONGE) ×2 IMPLANT
SUT PROLENE 6 0 CC (SUTURE) ×2 IMPLANT
SUT SILK 2 0 SH (SUTURE) IMPLANT
SUT VIC AB 3-0 SH 27 (SUTURE) ×1
SUT VIC AB 3-0 SH 27X BRD (SUTURE) ×1 IMPLANT
SYR 10ML LL (SYRINGE) ×2 IMPLANT
SYR 50ML LL SCALE MARK (SYRINGE) ×2 IMPLANT
UNDERPAD 30X36 HEAVY ABSORB (UNDERPADS AND DIAPERS) ×2 IMPLANT

## 2020-10-16 NOTE — Anesthesia Preprocedure Evaluation (Signed)
Anesthesia Evaluation  Patient identified by MRN, date of birth, ID band Patient awake    Reviewed: Allergy & Precautions, NPO status , Patient's Chart, lab work & pertinent test results, reviewed documented beta blocker date and time   History of Anesthesia Complications Negative for: history of anesthetic complications  Airway Mallampati: II  TM Distance: >3 FB Neck ROM: Full    Dental  (+) Dental Advisory Given, Chipped, Missing, Poor Dentition   Pulmonary former smoker,    Pulmonary exam normal breath sounds clear to auscultation       Cardiovascular Exercise Tolerance: Good hypertension, Pt. on medications and Pt. on home beta blockers + Past MI, + CABG and + Peripheral Vascular Disease (AAA repair)  Normal cardiovascular exam+ dysrhythmias Atrial Fibrillation  Rhythm:Regular Rate:Normal - Systolic murmurs, - Diastolic murmurs, - Friction Rub, - Carotid Bruit, - Peripheral Edema and - Systolic Click 1. Left ventricular ejection fraction, by estimation, is 55 to 60%. The left ventricle has normal function. The left ventricle has no regional wall motion abnormalities. There is mild concentric left ventricular hypertrophy. Left ventricular diastolic parameters are consistent with Grade II diastolic dysfunction (pseudonormalization).  2. Right ventricular systolic function is low normal. The right ventricular size is normal. There is normal pulmonary artery systolic pressure.  3. Left atrial size was mild to moderately dilated.  4. The mitral valve is grossly normal. Trivial mitral valve  regurgitation.  5. The aortic valve is tricuspid. Aortic valve regurgitation is mild. No aortic stenosis is present.  6. The inferior vena cava is dilated in size with >50% respiratory variability, suggesting right atrial pressure of 8 mmHg.   02-Aug-2020 08:47:38 [facility] ROUTINE RECORD [phone] (41 yr) Male  Caucasian Vent. rate 74 BPM PR interval * ms QRS duration 130 ms QT/QTcB 458/508 ms P-R-T axes * -28 93 Atrial fibrillation with premature ventricular or aberrantly conducted complexes Left ventricular hypertrophy with QRS widening ( R in aVL , Cornell product ) Cannot rule out Septal infarct , age undetermined Abnormal ECG   Neuro/Psych CVA, No Residual Symptoms negative psych ROS   GI/Hepatic GERD  Medicated and Controlled,  Endo/Other  negative endocrine ROS  Renal/GU ESRF and DialysisRenal disease Bladder dysfunction (bladder tumor)  prostate cancer    Musculoskeletal negative musculoskeletal ROS (+)   Abdominal   Peds  Hematology negative hematology ROS (+)   Anesthesia Other Findings   Reproductive/Obstetrics negative OB ROS                            Anesthesia Physical  Anesthesia Plan  ASA: 3  Anesthesia Plan: General   Post-op Pain Management:    Induction: Intravenous  PONV Risk Score and Plan: Propofol infusion  Airway Management Planned: Nasal Cannula, Natural Airway and Simple Face Mask  Additional Equipment:   Intra-op Plan:   Post-operative Plan:   Informed Consent: I have reviewed the patients History and Physical, chart, labs and discussed the procedure including the risks, benefits and alternatives for the proposed anesthesia with the patient or authorized representative who has indicated his/her understanding and acceptance.     Dental advisory given  Plan Discussed with: CRNA and Surgeon  Anesthesia Plan Comments: (Possible GA with airway was explained.)        Anesthesia Quick Evaluation

## 2020-10-16 NOTE — Op Note (Signed)
    OPERATIVE REPORT  DATE OF SURGERY: 10/16/2020  PATIENT: Ronald [name], 80 y.o. male MRN: [id number]  DOB: 02-03-1941  PRE-OPERATIVE DIAGNOSIS: Stage renal disease  POST-OPERATIVE DIAGNOSIS:  Same  PROCEDURE: Left radiocephalic AV fistula creation  SURGEON:  Curt Jews, M.D.  PHYSICIAN ASSISTANT: Nurse  The assistant was needed for exposure and to expedite the case  ANESTHESIA: Local with sedation  EBL: per anesthesia record  Total I/O In: 700 [I.V.:600; IV Piggyback:100] Out: 5 [Blood:5]  BLOOD ADMINISTERED: none  DRAINS: none  SPECIMEN: none  COUNTS CORRECT:  YES  PATIENT DISPOSITION:  PACU - hemodynamically stable  PROCEDURE DETAILS: The patient was taken operating placed supine position where the area of the left arm prepped draped you sterile fashion.  SonoSite ultrasound was used to visualize the vein.  The patient did have a moderate size cephalic vein at the wrist.  Incision was made using local anesthesia between the radial artery and the cephalic vein.  The vein was mobilized circumferentially and tributary branches were ligated with 3-0 and 4-0 silk ties and divided.  The vein branch above the level of the wrist and the larger of these 2 was used for fistula.  The radial artery was exposed through the same incision.  There was mild to moderate atherosclerotic change but no evidence of severe disease.  The artery was occluded proximally distally with Serafin clamps and opened with an 11 blade and sent longitudinally with Potts scissors.  The vein was cut to the appropriate length and was spatulated and sewn end-to-side to the artery with a running 6-0 Prolene suture.  A 2-1/2 dilator passed easily through the anastomosis proximally distally prior to completion of the anastomosis.  The anastomosis was completed and clamps were removed.  There was an excellent thrill noted.  The wound was irrigated with saline.  Hemostasis electrocautery.  The wound was closed  with 3-0 Vicryl in the subcutaneous septic tissue.  Sterile dressing was applied and the patient was transferred to the recovery room in stable condition   Rosetta Posner, M.D., [facility] 10/16/2020 9:10 AM  Note: Portions of this report may have been transcribed using voice recognition software.  Every effort has been made to ensure accuracy; however, inadvertent computerized transcription errors may still be present.

## 2020-10-16 NOTE — Discharge Instructions (Addendum)
[facility]  Discharge Instructions  AV Fistula or Graft Surgery for Dialysis Access  Please refer to the following instructions for your post-procedure care. Your surgeon or physician assistant will discuss any changes with you.  Activity  You may drive the day following your surgery, if you are comfortable and no longer taking prescription pain medication. Resume full activity as the soreness in your incision resolves.  Bathing/Showering  You may shower after you go home. Keep your incision dry for 48 hours. Do not soak in a bathtub, hot tub, or swim until the incision heals completely. You may not shower if you have a hemodialysis catheter.  Incision Care  Clean your incision with mild soap and water after 48 hours. Pat the area dry with a clean towel. You do not need a bandage unless otherwise instructed. Do not apply any ointments or creams to your incision. You may have skin glue on your incision. Do not peel it off. It will come off on its own in about one week. Your arm may swell a bit after surgery. To reduce swelling use pillows to elevate your arm so it is above your heart. Your doctor will tell you if you need to lightly wrap your arm with an ACE bandage.  Diet  Resume your normal diet. There are not special food restrictions following this procedure. In order to heal from your surgery, it is CRITICAL to get adequate nutrition. Your body requires vitamins, minerals, and protein. Vegetables are the best source of vitamins and minerals. Vegetables also provide the perfect balance of protein. Processed food has little nutritional value, so try to avoid this.  Medications  Resume taking all of your medications. If your incision is causing pain, you may take over-the counter pain relievers such as acetaminophen (Tylenol). If you were prescribed a stronger pain medication, please be aware these medications can cause nausea and constipation. Prevent  nausea by taking the medication with a snack or meal. Avoid constipation by drinking plenty of fluids and eating foods with high amount of fiber, such as fruits, vegetables, and grains.  Do not take Tylenol if you are taking prescription pain medications.  Follow up Your surgeon may want to see you in the office following your access surgery. If so, this will be arranged at the time of your surgery.  Please call us immediately for any of the following conditions:  Increased pain, redness, drainage (pus) from your incision site Fever of 101 degrees or higher Severe or worsening pain at your incision site Hand pain or numbness.  Reduce your risk of vascular disease:  Stop smoking. If you would like help, call [facility] at 1-800-QUIT-NOW (267) 414-3069) or [place] at [place] your cholesterol Maintain a desired weight Control your diabetes Keep your blood pressure down  Dialysis  It will take several weeks to several months for your new dialysis access to be ready for use. Your surgeon will determine when it is okay to use it. Your nephrologist will continue to direct your dialysis. You can continue to use your Permcath until your new access is ready for use.   10/16/2020 Ronald [name] [id number] [birth year]  Surgeon(s): Early, Arvilla Meres, MD  Procedure(s): LEFT ARM ARTERIOVENOUS (AV) FISTULA CREATION   May stick graft immediately   May stick graft on designated area only:    Do not stick fistula for 12 weeks    If you have any questions, please call the office at [phone].

## 2020-10-16 NOTE — H&P (Signed)
Office Visit 07/16/2020 [facility] Alizabeth Antonio, Arvilla Meres, MD Vascular Surgery Chronic kidney disease (CKD), stage IV (severe) [facility]) Dx New Patient (Initial Visit); Referred by Celene Squibb, MD Reason for Visit   Additional Documentation  Vitals:  BP 164/76 Important   Pulse 63 Temp 97.6 F (36.4 C) (Other (Comment)) Resp 14 Ht 6' (1.829 m) Wt 89.8 kg SpO2 96% BMI 26.85 kg/m BSA 2.14 m  More Vitals  Flowsheets:  MEWS Score, Anthropometrics, NEWS, Infectious Disease Screening, Vital Signs, Method of Visit   Encounter Info:  Billing Info, History, Allergies, Detailed Report    All Notes   Progress Notes by Rosetta Posner, MD at 07/16/2020 2:40 PM  Author: Rosetta Posner, MD Author Type: Physician Filed: 07/16/2020  2:52 PM  Note Status: Signed Cosign: Cosign Not Required Encounter Date: 07/16/2020  Editor: Rosetta Posner, MD (Physician)                                                    [facility] of [place]   Patient name: Ronald [name] MRN: [id number]        DOB: 07/27/1940          Sex: male   REASON FOR VISIT: Discuss access for hemodialysis   Nephrologist: Theador Hawthorne   HPI: Ronald [name] is a 80 y.o. male here for discussion of access for hemodialysis.  He is here today with his wife.  I had an office visit with him in the follow-up 2021.  At that time he was stage IV kidney disease.  He had been scheduled for a left arm AV fistula creation.  He canceled this in January due to concerns of COVID resurgence.  He now is seen again.  He has had progression and is now approaching need for hemodialysis.  We have been asked to place a catheter and permanent access.  He denies any uremic symptoms.  He is not sick to his stomach.  He does have some swelling in his lower extremities.  He denies any shortness of breath.       Past Medical History:  Diagnosis Date   GERD (gastroesophageal reflux disease)     Hypertension      Myocardial infarction [facility])      1999   Renal insufficiency     Stroke ([place])      no deficits; had stroke while trying to place "stents in legs".           Family History  Problem Relation Age of Onset   Cancer Father        SOCIAL HISTORY: Social History         Tobacco Use   Smoking status: Current Every Day Smoker      Packs/day: 2.00      Years: 50.00      Pack years: 100.00      Types: Cigarettes      Start date: 08/07/1957   Smokeless tobacco: Never Used   Tobacco comment: on chantix now  Substance Use Topics   Alcohol use: No      No Known Allergies         Current Outpatient Medications  Medication Sig Dispense Refill   amLODipine (NORVASC) 5 MG tablet Take 5 mg by mouth daily.       ascorbic  acid (VITAMIN C) 1000 MG tablet Take by mouth.       atorvastatin (LIPITOR) 80 MG tablet Take 80 mg by mouth daily.       carvedilol (COREG) 12.5 MG tablet Take 6.25 mg by mouth 2 (two) times daily.       clopidogrel (PLAVIX) 75 MG tablet Take 75 mg by mouth daily.        furosemide (LASIX) 40 MG tablet Take 40 mg by mouth daily.        tamsulosin (FLOMAX) 0.4 MG CAPS capsule Take 1 capsule (0.4 mg total) by mouth daily after supper. 90 capsule 3   traMADol (ULTRAM) 50 MG tablet Take by mouth every 6 (six) hours as needed.       varenicline (CHANTIX) 1 MG tablet         zolpidem (AMBIEN) 10 MG tablet Take 10 mg by mouth at bedtime.        No current facility-administered medications for this visit.      REVIEW OF SYSTEMS:  [X]  denotes positive finding, [ ]  denotes negative finding Cardiac   Comments:  Chest pain or chest pressure:      Shortness of breath upon exertion:      Short of breath when lying flat:      Irregular heart rhythm:             Vascular      Pain in calf, thigh, or hip brought on by ambulation:      Pain in feet at night that wakes you up from your sleep:       Blood clot in your veins:      Leg swelling:  x               PHYSICAL  EXAM:    Vitals:    07/16/20 1419  BP: (!) 164/76  Pulse: 63  Resp: 14  Temp: 97.6 F (36.4 C)  TempSrc: Other (Comment)  SpO2: 96%  Weight: 198 lb (89.8 kg)  Height: 6' (1.829 m)      GENERAL: The patient is a well-nourished male, in no acute distress. The vital signs are documented above. CARDIOVASCULAR: 2+ radial pulses bilaterally.  Very well-developed left arm service vein and the cephalic vein.  This does extend in the usual course but then more on the dorsum of his wrist. PULMONARY: There is good air exchange  MUSCULOSKELETAL: There are no major deformities or cyanosis. NEUROLOGIC: No focal weakness or paresthesias are detected. SKIN: There are no ulcers or rashes noted.  Multiple bruises on both upper extremities PSYCHIATRIC: The patient has a normal affect.   DATA:  None   MEDICAL ISSUES: I again discussed the need for hemodialysis access.  He now is progressing to need for hemodialysis and we have been quested to place catheter and fistula.  I discussed both of these at length with the patient and his wife including of potential limitations with infection of the catheter and also not maturation and thromboses of the fistula.  He does have a very nice vein so I think he has a high likelihood of success.  We will communicate with Dr. Toya Smothers office.  We will plan for surgery on August 01, 2020 at [facility].  This is the next available date at [facility].  If he needs surgery prior to this, can proceed at [place]. Zohair Epp, MD [facility] [place]  Office Tel [phone]   Note: Portions of this report may have been transcribed using voice recognition software.  Every effort has been made to ensure accuracy; however, inadvertent computerized transcription errors may still be present.      The patient has been cleared from cardiac standpoint  Addendum:  The patient has been re-examined and re-evaluated.  The  patient's history and physical has been reviewed and is unchanged.    Ronald [name] is a 80 y.o. male is being admitted with ESRD. All the risks, benefits and other treatment options have been discussed with the patient. The patient has consented to proceed with Procedure(s): LEFT ARM ARTERIOVENOUS (AV) FISTULA CREATION VERSUS GRAFT PLACEMENT as a surgical intervention.  Pranavi Aure 10/16/2020 7:14 AM Vascular and Vein Surgery

## 2020-10-16 NOTE — Anesthesia Postprocedure Evaluation (Signed)
Anesthesia Post Note  Patient: Ronald [name]  Procedure(s) Performed: LEFT ARM ARTERIOVENOUS (AV) FISTULA CREATION (Left: Arm Lower)  Patient location during evaluation: PACU Anesthesia Type: General Level of consciousness: awake and alert and oriented Pain management: pain level controlled Vital Signs Assessment: post-procedure vital signs reviewed and stable Respiratory status: spontaneous breathing and respiratory function stable Cardiovascular status: blood pressure returned to baseline and stable Postop Assessment: no apparent nausea or vomiting Anesthetic complications: no   No notable events documented.   Last Vitals:  Vitals:   10/16/20 0931 10/16/20 0941  BP: (!) 145/60 (!) 160/66  Pulse: (!) 52 62  Resp: 15 18  Temp: 36.8 C 37 C  SpO2: 98% 98%    Last Pain:  Vitals:   10/16/20 0941  TempSrc: Oral  PainSc: 0-No pain                 Johanthan Kneeland C Jari Carollo

## 2020-10-16 NOTE — Transfer of Care (Signed)
Immediate Anesthesia Transfer of Care Note  Patient: Ronald [name]  Procedure(s) Performed: LEFT ARM ARTERIOVENOUS (AV) FISTULA CREATION (Left: Arm Lower)  Patient Location: PACU  Anesthesia Type:MAC  Level of Consciousness: awake, alert  and oriented  Airway & Oxygen Therapy: Patient Spontanous Breathing  Post-op Assessment: Report given to RN and Post -op Vital signs reviewed and stable  Post vital signs: Reviewed and stable  Last Vitals:  Vitals Value Taken Time  BP 134/64 10/16/20 0857  Temp    Pulse 56 10/16/20 0858  Resp 21 10/16/20 0858  SpO2 97 % 10/16/20 0858  Vitals shown include unvalidated device data.  Last Pain:  Vitals:   10/16/20 0654  TempSrc: Oral  PainSc: 0-No pain      Patients Stated Pain Goal: 5 (83/16/74 2552)  Complications: No notable events documented.

## 2020-10-17 ENCOUNTER — Encounter (HOSPITAL_COMMUNITY): Payer: Self-pay | Admitting: Vascular Surgery

## 2020-10-17 DIAGNOSIS — N2581 Secondary hyperparathyroidism of renal origin: Secondary | ICD-10-CM | POA: Diagnosis not present

## 2020-10-17 DIAGNOSIS — N186 End stage renal disease: Secondary | ICD-10-CM | POA: Diagnosis not present

## 2020-10-17 DIAGNOSIS — Z992 Dependence on renal dialysis: Secondary | ICD-10-CM | POA: Diagnosis not present

## 2020-10-17 NOTE — Progress Notes (Signed)
Cardiology Office Note    Date:  10/23/2020   ID:  Ronald [name], DOB Dec 07, 1940, MRN [id number]   PCP:  Celene Squibb, MD   [place]  Cardiologist:  Jenkins Rouge, MD  Advanced Practice Provider:  No care team member to display Electrophysiologist:  None   [phone]   No chief complaint on file.   History of Present Illness:  Ronald [name] is a 80 y.o. male with history of hypertension, hyperlipidemia, CKD, CAD status post CABG 1999 cath 2009 LIMA patent, SVG to the circumflex patent and native circumflex to RCA collaterals with total occlusion of native coronary arteries.  All done in [place]. Had a mini stroke during a procedure in [place] as well.  Had AAA repair 01/29/2012 op note reported small type II endoleak at end of procedure.  Patient was seen for the first time by me 07/2016 and exercise Myoview ordered because of likely need for dialysis and surgery for fistula.  Patient had hypertensive response to exercise prior scar no ischemia LVEF 56% low risk study.  2D echo 05/11/2019 LVEF 55 to 60% grade 2 DD  Seen by PA 08/06/20 BP up had central catheter for dialysis access Subsequently had left radio-cephalic AV fistula with Dr Donnetta Hutching 10/16/20 Previously had right tunneled IJ catheter on 08/02/20 K 4.2 Cr 6.5   TTE 09/13/20 EF 50-55% mild AR Myovue 09/12/20 fixed defect in mid/apical anteroseptal wall and apical to basal inferior lateral wall no ischemia EF poorly estimated 31% frequent PAC short bursts of SVT Was not taking coreg at time   Biggest issue is not being able to take a shower with dialysis catheter. Dialysis going well  From [place] Lived in [place] Has two older children son in [place] and daughter in [place] Wife Is younger and healthy   Past Medical History:  Diagnosis Date   GERD (gastroesophageal reflux disease)    Hypertension    Myocardial infarction [facility])    1999   Prostate cancer ([place]) 02/2018   Renal insufficiency    Stroke ([place])    no  deficits; had stroke while trying to place "stents in legs".    Past Surgical History:  Procedure Laterality Date   ABDOMINAL AORTIC ANEURYSM REPAIR  2012   IN [place]   APPENDECTOMY     AV FISTULA PLACEMENT Left 10/16/2020   Procedure: LEFT ARM ARTERIOVENOUS (AV) FISTULA CREATION;  Surgeon: Rosetta Posner, MD;  Location: [facility];  Service: Vascular;  Laterality: Left;   CORONARY ARTERY BYPASS GRAFT  01/1998   3 vessels 18 years ago   [place] Right 09/20/2015   Procedure: REPAIR RIGHT INGUINAL HERNIA  WITH MESH;  Surgeon: Vickie Epley, MD;  Location: [facility];  Service: General;  Laterality: Right;   INSERTION OF DIALYSIS CATHETER Right 08/02/2020   Procedure: INSERTION OF RIGHT TUNNELED INTERNAL JUGULAR  DIALYSIS CATHETER;  Surgeon: Rosetta Posner, MD;  Location: [facility];  Service: Vascular;  Laterality: Right;   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   carvedilol (COREG) 6.25 MG tablet Take 6.25 mg by mouth 2 (two) times daily.   MAGNESIUM CITRATE PO Take 400 mg by mouth at bedtime.   MAGNESIUM-POTASSIUM PO Take 1 tablet by mouth daily.   tamsulosin (FLOMAX) 0.4 MG CAPS capsule Take 1 capsule (0.4 mg total) by  mouth daily after supper.   traMADol (ULTRAM) 50 MG tablet Take 50 mg by mouth 2 (two) times daily as needed for moderate pain.   varenicline (CHANTIX) 0.5 MG tablet Take 0.5 mg by mouth 2 (two) times daily.   zolpidem (AMBIEN) 10 MG tablet Take 10 mg by mouth at bedtime as needed for sleep.     Allergies:   Tums [calcium carbonate]   Social History   Socioeconomic History   Marital status: Married    Spouse name: Not on file   Number of children: Not on file   Years of education: Not on file   Highest education level: Not on file  Occupational History   Not on file  Tobacco Use   Smoking status: Former    Packs/day: 2.00    Years: 50.00     Pack years: 100.00    Types: Cigarettes    Start date: 08/07/1957   Smokeless tobacco: Never   Tobacco comments:    on chantix now  Vaping Use   Vaping Use: Never used  Substance and Sexual Activity   Alcohol use: No   Drug use: No   Sexual activity: Yes  Other Topics Concern   Not on file  Social History Narrative   Not on file   Social Determinants of Health   Financial Resource Strain: Not on file  Food Insecurity: Not on file  Transportation Needs: Not on file  Physical Activity: Not on file  Stress: Not on file  Social Connections: Not on file     Family History:  The patient's  family history includes Cancer in his father.   ROS:   Please see the history of present illness.    ROS All other systems reviewed and are negative.   PHYSICAL EXAM:   VS:  BP 122/70   Pulse 61   Ht 6\' 2"  (1.88 m)   Wt 89.8 kg   SpO2 95%   BMI 25.42 kg/m   Physical Exam   Affect appropriate Healthy:  appears stated age 81: normal Neck right IJ tunneled catheter  JVP normal no bruits no thyromegaly Lungs clear with no wheezing and good diaphragmatic motion Heart:  S1/S2 SEM murmur, no rub, gallop or click PMI normal Abdomen: benighn, BS positve, no tenderness, no AAA no bruit.  No HSM or HJR Distal pulses intact with no bruits No edema Neuro non-focal Skin warm and dry No muscular weakness New left arm fistula with thrill    Wt Readings from Last 3 Encounters:  10/23/20 89.8 kg  08/06/20 87.4 kg  08/02/20 89.8 kg      Studies/Labs Reviewed:   EKG:   SR PaCls LVH rate 74 08/02/20   Recent Labs: 10/16/2020: BUN 35; Creatinine, Ser 6.50; Hemoglobin 11.9; Potassium 4.2; Sodium 138   Lipid Panel No results found for: CHOL, TRIG, HDL, CHOLHDL, VLDL, LDLCALC, LDLDIRECT  Additional studies/ records that were reviewed today include:   Echo:  09/13/20:  IMPRESSIONS     1. Left ventricular ejection fraction, by estimation, is 50 to 55%. The  left ventricle has low  normal function. The left ventricle demonstrates  regional wall motion abnormalities (see scoring diagram/findings for  description). The left ventricular  internal cavity size was mildly dilated. There is mild left ventricular  hypertrophy. Left ventricular diastolic parameters are consistent with  Grade I diastolic dysfunction (impaired relaxation).   2. Right ventricular systolic function is normal. The right ventricular  size is normal. Tricuspid regurgitation signal is  inadequate for assessing  PA pressure.   3. The mitral valve is grossly normal. No evidence of mitral valve  regurgitation.   4. The aortic valve is tricuspid. Aortic valve regurgitation is mild.  Aortic regurgitation PHT measures 545 msec.   5. The inferior vena cava is normal in size with greater than 50%  respiratory variability, suggesting right atrial pressure of 3 mmHg.   Comparison(s): Prior images reviewed side by side. LVEF now low normal  range at 50-55%. Basal inferior hypokinesis looks to be old.   Myovue :  Study Result  Narrative & Impression  No diagnostic ST segment changes to indicate ischemia. Frequent PACs and bursts of SVT/atrial tachycardia noted throughout the study. Patient asymptomatic in terms of palpitations. Small to medium sized, moderate intensity, fixed defects involving the mid to apical anteroseptal wall and apical to basal inferolateral wall. No large ischemic territories are demonstrated. Most consistent with infarct scar. This is a high risk study based on calculated LVEF. Nuclear stress EF: 31%. Suggest echocardiogram for more complete assessment of LVEF and possibility of ischemic cardiomyopathy.       Risk Assessment/Calculations:         ASSESSMENT:    No diagnosis found.    PLAN:   CAD : status post CABG 1999 cath 2009 LIMA patent, SVG to the circumflex patent and native circumflex to RCA collaterals with total occlusion of native coronary arteries.  Cardiac cath  years ago with aborted intervention because of CVA during procedure.  Non ischemic myovue 8/3/222 continue medical Rx  ESRD has right IJ tunneled catheter and new fistula in LUE f/u nephrology and Dr Donnetta Hutching [facility]   AAA : endovascular repair with endoleak/residual AAA last CT 2019 and for renal abdominal aortic aneurysm measuring at least 7.0 cm partially visualized aortobiiliac stent graft.  Needs to be followed by Dr. Donnetta Hutching  Hypertension Well controlled.  Continue current medications and low sodium Dash type diet.    Hyperlipidemia on Lipitor managed by PCP  SVT:  with PACls continue beta blocker    F/U Dr Donnetta Hutching regarding dialysis catheter removal once fistula mature F/U Dr Early imaging of endovascular stent graft AAA F/U Cardiology in a year     Signed, Jenkins Rouge, MD  10/23/2020 11:11 AM    [name], [place], [place]  [address] Phone: [phone]; Fax: [phone]

## 2020-10-18 ENCOUNTER — Encounter (HOSPITAL_COMMUNITY): Payer: Self-pay | Admitting: Vascular Surgery

## 2020-10-19 DIAGNOSIS — N186 End stage renal disease: Secondary | ICD-10-CM | POA: Diagnosis not present

## 2020-10-19 DIAGNOSIS — Z992 Dependence on renal dialysis: Secondary | ICD-10-CM | POA: Diagnosis not present

## 2020-10-22 DIAGNOSIS — N2581 Secondary hyperparathyroidism of renal origin: Secondary | ICD-10-CM | POA: Diagnosis not present

## 2020-10-22 DIAGNOSIS — N186 End stage renal disease: Secondary | ICD-10-CM | POA: Diagnosis not present

## 2020-10-22 DIAGNOSIS — Z992 Dependence on renal dialysis: Secondary | ICD-10-CM | POA: Diagnosis not present

## 2020-10-23 ENCOUNTER — Ambulatory Visit (INDEPENDENT_AMBULATORY_CARE_PROVIDER_SITE_OTHER): Payer: Medicare Other | Admitting: Cardiovascular Disease

## 2020-10-23 ENCOUNTER — Other Ambulatory Visit: Payer: Self-pay

## 2020-10-23 ENCOUNTER — Encounter: Payer: Self-pay | Admitting: Cardiovascular Disease

## 2020-10-23 VITALS — BP 122/70 | HR 61 | Ht 74.0 in | Wt 198.0 lb

## 2020-10-23 DIAGNOSIS — N186 End stage renal disease: Secondary | ICD-10-CM

## 2020-10-23 DIAGNOSIS — Z951 Presence of aortocoronary bypass graft: Secondary | ICD-10-CM

## 2020-10-23 DIAGNOSIS — I2510 Atherosclerotic heart disease of native coronary artery without angina pectoris: Secondary | ICD-10-CM | POA: Diagnosis not present

## 2020-10-23 DIAGNOSIS — I471 Supraventricular tachycardia: Secondary | ICD-10-CM

## 2020-10-23 DIAGNOSIS — I10 Essential (primary) hypertension: Secondary | ICD-10-CM | POA: Diagnosis not present

## 2020-10-23 DIAGNOSIS — I714 Abdominal aortic aneurysm, without rupture, unspecified: Secondary | ICD-10-CM

## 2020-10-23 DIAGNOSIS — E782 Mixed hyperlipidemia: Secondary | ICD-10-CM

## 2020-10-23 NOTE — Patient Instructions (Signed)
Medication Instructions:  Your physician recommends that you continue on your current medications as directed. Please refer to the Current Medication list given to you today.  *If you need a refill on your cardiac medications before your next appointment, please call your pharmacy*   Lab Work: NONE   If you have labs (blood work) drawn today and your tests are completely normal, you will receive your results only by: . MyChart Message (if you have MyChart) OR . A paper copy in the mail If you have any lab test that is abnormal or we need to change your treatment, we will call you to review the results.   Testing/Procedures: NONE    Follow-Up: At [facility], you and your health needs are our priority.  As part of our continuing mission to provide you with exceptional heart care, we have created designated Provider Care Teams.  These Care Teams include your primary Cardiologist (physician) and Advanced Practice Providers (APPs -  Physician Assistants and Nurse Practitioners) who all work together to provide you with the care you need, when you need it.  We recommend signing up for the patient portal called "MyChart".  Sign up information is provided on this After Visit Summary.  MyChart is used to connect with patients for Virtual Visits (Telemedicine).  Patients are able to view lab/test results, encounter notes, upcoming appointments, etc.  Non-urgent messages can be sent to your provider as well.   To learn more about what you can do with MyChart, go to [email or web address].    Your next appointment:   1 year(s)  The format for your next appointment:   In Person  Provider:   Peter Nishan, MD   Other Instructions Thank you for choosing [facility]!    

## 2020-10-24 DIAGNOSIS — Z992 Dependence on renal dialysis: Secondary | ICD-10-CM | POA: Diagnosis not present

## 2020-10-24 DIAGNOSIS — N186 End stage renal disease: Secondary | ICD-10-CM | POA: Diagnosis not present

## 2020-10-26 DIAGNOSIS — Z992 Dependence on renal dialysis: Secondary | ICD-10-CM | POA: Diagnosis not present

## 2020-10-26 DIAGNOSIS — N186 End stage renal disease: Secondary | ICD-10-CM | POA: Diagnosis not present

## 2020-10-29 DIAGNOSIS — Z992 Dependence on renal dialysis: Secondary | ICD-10-CM | POA: Diagnosis not present

## 2020-10-29 DIAGNOSIS — N186 End stage renal disease: Secondary | ICD-10-CM | POA: Diagnosis not present

## 2020-10-31 DIAGNOSIS — N186 End stage renal disease: Secondary | ICD-10-CM | POA: Diagnosis not present

## 2020-10-31 DIAGNOSIS — Z992 Dependence on renal dialysis: Secondary | ICD-10-CM | POA: Diagnosis not present

## 2020-11-02 DIAGNOSIS — Z992 Dependence on renal dialysis: Secondary | ICD-10-CM | POA: Diagnosis not present

## 2020-11-02 DIAGNOSIS — N186 End stage renal disease: Secondary | ICD-10-CM | POA: Diagnosis not present

## 2020-11-05 DIAGNOSIS — N186 End stage renal disease: Secondary | ICD-10-CM | POA: Diagnosis not present

## 2020-11-05 DIAGNOSIS — Z992 Dependence on renal dialysis: Secondary | ICD-10-CM | POA: Diagnosis not present

## 2020-11-07 DIAGNOSIS — N186 End stage renal disease: Secondary | ICD-10-CM | POA: Diagnosis not present

## 2020-11-07 DIAGNOSIS — Z992 Dependence on renal dialysis: Secondary | ICD-10-CM | POA: Diagnosis not present

## 2020-11-09 DIAGNOSIS — K219 Gastro-esophageal reflux disease without esophagitis: Secondary | ICD-10-CM | POA: Diagnosis not present

## 2020-11-09 DIAGNOSIS — I10 Essential (primary) hypertension: Secondary | ICD-10-CM | POA: Diagnosis not present

## 2020-11-09 DIAGNOSIS — N186 End stage renal disease: Secondary | ICD-10-CM | POA: Diagnosis not present

## 2020-11-09 DIAGNOSIS — Z992 Dependence on renal dialysis: Secondary | ICD-10-CM | POA: Diagnosis not present

## 2020-11-12 DIAGNOSIS — Z992 Dependence on renal dialysis: Secondary | ICD-10-CM | POA: Diagnosis not present

## 2020-11-12 DIAGNOSIS — N186 End stage renal disease: Secondary | ICD-10-CM | POA: Diagnosis not present

## 2020-11-14 DIAGNOSIS — Z992 Dependence on renal dialysis: Secondary | ICD-10-CM | POA: Diagnosis not present

## 2020-11-14 DIAGNOSIS — N186 End stage renal disease: Secondary | ICD-10-CM | POA: Diagnosis not present

## 2020-11-16 DIAGNOSIS — N186 End stage renal disease: Secondary | ICD-10-CM | POA: Diagnosis not present

## 2020-11-16 DIAGNOSIS — Z992 Dependence on renal dialysis: Secondary | ICD-10-CM | POA: Diagnosis not present

## 2020-11-19 DIAGNOSIS — N186 End stage renal disease: Secondary | ICD-10-CM | POA: Diagnosis not present

## 2020-11-19 DIAGNOSIS — Z992 Dependence on renal dialysis: Secondary | ICD-10-CM | POA: Diagnosis not present

## 2020-11-19 DIAGNOSIS — N2581 Secondary hyperparathyroidism of renal origin: Secondary | ICD-10-CM | POA: Diagnosis not present

## 2020-11-19 DIAGNOSIS — D509 Iron deficiency anemia, unspecified: Secondary | ICD-10-CM | POA: Diagnosis not present

## 2020-11-21 ENCOUNTER — Other Ambulatory Visit: Payer: Self-pay

## 2020-11-21 ENCOUNTER — Ambulatory Visit (INDEPENDENT_AMBULATORY_CARE_PROVIDER_SITE_OTHER): Payer: Medicare Other | Admitting: Vascular Surgery

## 2020-11-21 ENCOUNTER — Encounter: Payer: Self-pay | Admitting: Vascular Surgery

## 2020-11-21 VITALS — BP 163/66 | HR 51 | Temp 97.6°F | Resp 18 | Ht 74.0 in | Wt 202.0 lb

## 2020-11-21 DIAGNOSIS — N186 End stage renal disease: Secondary | ICD-10-CM

## 2020-11-21 DIAGNOSIS — Z992 Dependence on renal dialysis: Secondary | ICD-10-CM | POA: Diagnosis not present

## 2020-11-21 NOTE — Progress Notes (Signed)
   [facility] [place]  Patient name: Ronald [name] MRN: [id number] DOB: October 16, 1940 Sex: male  REASON FOR VISIT: Follow-up left radiocephalic fistula  HPI: Ronald [name] is a 80 y.o. male here today for follow-up.  He currently has dialysis via right IJ catheter and reports no difficulty with this.  He underwent left radiocephalic AV fistula creation by myself on 10/16/2020.  He has had no difficulty with this.  He reports no steal symptoms.  Current Outpatient Medications  Medication Sig Dispense Refill   atorvastatin (LIPITOR) 80 MG tablet Take 80 mg by mouth daily.     carvedilol (COREG) 6.25 MG tablet Take 6.25 mg by mouth 2 (two) times daily.     MAGNESIUM CITRATE PO Take 400 mg by mouth at bedtime.     MAGNESIUM-POTASSIUM PO Take 1 tablet by mouth daily.     tamsulosin (FLOMAX) 0.4 MG CAPS capsule Take 1 capsule (0.4 mg total) by mouth daily after supper. 90 capsule 3   traMADol (ULTRAM) 50 MG tablet Take 50 mg by mouth 2 (two) times daily as needed for moderate pain.     varenicline (CHANTIX) 0.5 MG tablet Take 0.5 mg by mouth 2 (two) times daily.     zolpidem (AMBIEN) 10 MG tablet Take 10 mg by mouth at bedtime as needed for sleep.     No current facility-administered medications for this visit.     PHYSICAL EXAM: Vitals:   11/21/20 1316  BP: (!) 163/66  Pulse: (!) 51  Resp: 18  Temp: 97.6 F (36.4 C)  TempSrc: Temporal  SpO2: 99%  Weight: 202 lb (91.6 kg)  Height: 6\' 2"  (1.88 m)    GENERAL: The patient is a well-nourished male, in no acute distress. The vital signs are documented above. Excellent Harriette Tovey maturation with very large caliber cephalic vein throughout the forearm.  The vein is dilated into the basilic and cephalic veins at the antecubital space and proximally.  MEDICAL ISSUES: Excellent Miyo Aina maturation of left radiocephalic fistula.  Size is adequate for access currently but would prefer  to wait a total of 12 weeks if possible for maturation of the fistula.  Should be able to use the fistula at that time.  If he has catheter malfunction could use the fistula at 10 weeks if necessary.  He will see [place] again on an as-needed basis.  I am available for catheter removal at [facility] after he is successfully used his fistula   Rosetta Posner, MD FACS [facility] [place] Office Tel [phone]  Note: Portions of this report may have been transcribed using voice recognition software.  Every effort has been made to ensure accuracy; however, inadvertent computerized transcription errors may still be present.

## 2020-11-23 DIAGNOSIS — N186 End stage renal disease: Secondary | ICD-10-CM | POA: Diagnosis not present

## 2020-11-23 DIAGNOSIS — Z992 Dependence on renal dialysis: Secondary | ICD-10-CM | POA: Diagnosis not present

## 2020-11-26 DIAGNOSIS — N2581 Secondary hyperparathyroidism of renal origin: Secondary | ICD-10-CM | POA: Diagnosis not present

## 2020-11-26 DIAGNOSIS — N186 End stage renal disease: Secondary | ICD-10-CM | POA: Diagnosis not present

## 2020-11-26 DIAGNOSIS — Z992 Dependence on renal dialysis: Secondary | ICD-10-CM | POA: Diagnosis not present

## 2020-11-28 DIAGNOSIS — Z992 Dependence on renal dialysis: Secondary | ICD-10-CM | POA: Diagnosis not present

## 2020-11-28 DIAGNOSIS — N186 End stage renal disease: Secondary | ICD-10-CM | POA: Diagnosis not present

## 2020-11-30 ENCOUNTER — Other Ambulatory Visit: Payer: Medicare Other

## 2020-11-30 ENCOUNTER — Other Ambulatory Visit: Payer: Self-pay

## 2020-11-30 DIAGNOSIS — Z992 Dependence on renal dialysis: Secondary | ICD-10-CM | POA: Diagnosis not present

## 2020-11-30 DIAGNOSIS — N186 End stage renal disease: Secondary | ICD-10-CM | POA: Diagnosis not present

## 2020-11-30 DIAGNOSIS — C61 Malignant neoplasm of prostate: Secondary | ICD-10-CM

## 2020-12-01 LAB — PSA: Prostate Specific Ag, Serum: 0.5 ng/mL (ref 0.0–4.0)

## 2020-12-03 DIAGNOSIS — Z992 Dependence on renal dialysis: Secondary | ICD-10-CM | POA: Diagnosis not present

## 2020-12-03 DIAGNOSIS — N186 End stage renal disease: Secondary | ICD-10-CM | POA: Diagnosis not present

## 2020-12-05 DIAGNOSIS — N186 End stage renal disease: Secondary | ICD-10-CM | POA: Diagnosis not present

## 2020-12-05 DIAGNOSIS — Z992 Dependence on renal dialysis: Secondary | ICD-10-CM | POA: Diagnosis not present

## 2020-12-07 ENCOUNTER — Other Ambulatory Visit: Payer: Self-pay

## 2020-12-07 ENCOUNTER — Ambulatory Visit (INDEPENDENT_AMBULATORY_CARE_PROVIDER_SITE_OTHER): Payer: Medicare Other | Admitting: Urology

## 2020-12-07 ENCOUNTER — Encounter: Payer: Self-pay | Admitting: Urology

## 2020-12-07 VITALS — BP 138/63 | HR 78 | Temp 98.6°F

## 2020-12-07 DIAGNOSIS — R3912 Poor urinary stream: Secondary | ICD-10-CM | POA: Diagnosis not present

## 2020-12-07 DIAGNOSIS — Z992 Dependence on renal dialysis: Secondary | ICD-10-CM | POA: Diagnosis not present

## 2020-12-07 DIAGNOSIS — N138 Other obstructive and reflux uropathy: Secondary | ICD-10-CM | POA: Diagnosis not present

## 2020-12-07 DIAGNOSIS — C61 Malignant neoplasm of prostate: Secondary | ICD-10-CM

## 2020-12-07 DIAGNOSIS — N401 Enlarged prostate with lower urinary tract symptoms: Secondary | ICD-10-CM | POA: Diagnosis not present

## 2020-12-07 DIAGNOSIS — N186 End stage renal disease: Secondary | ICD-10-CM | POA: Diagnosis not present

## 2020-12-07 LAB — MICROSCOPIC EXAMINATION
Bacteria, UA: NONE SEEN
Renal Epithel, UA: NONE SEEN /hpf
WBC, UA: NONE SEEN /hpf (ref 0–5)

## 2020-12-07 LAB — URINALYSIS, ROUTINE W REFLEX MICROSCOPIC
Bilirubin, UA: NEGATIVE
Glucose, UA: NEGATIVE
Ketones, UA: NEGATIVE
Leukocytes,UA: NEGATIVE
Nitrite, UA: NEGATIVE
Specific Gravity, UA: 1.015 (ref 1.005–1.030)
Urobilinogen, Ur: 0.2 mg/dL (ref 0.2–1.0)
pH, UA: 7 (ref 5.0–7.5)

## 2020-12-07 MED ORDER — TAMSULOSIN HCL 0.4 MG PO CAPS: 0.4000 mg | ORAL_CAPSULE | Freq: Every day | ORAL | 3 refills | Status: DC

## 2020-12-07 NOTE — Progress Notes (Signed)
Urological Symptom Review ° °Patient is experiencing the following symptoms: °Trouble starting stream ° ° °Review of Systems ° °Gastrointestinal (upper)  : °Negative for upper GI symptoms ° °Gastrointestinal (lower) : °Negative for lower GI symptoms ° °Constitutional : °Fatigue ° °Skin: °Negative for skin symptoms ° °Eyes: °Negative for eye symptoms ° °Ear/Nose/Throat : °Negative for Ear/Nose/Throat symptoms ° °Hematologic/Lymphatic: °Negative for Hematologic/Lymphatic symptoms ° °Cardiovascular : °Negative for cardiovascular symptoms ° °Respiratory : °Negative for respiratory symptoms ° °Endocrine: °Negative for endocrine symptoms ° °Musculoskeletal: °Negative for musculoskeletal symptoms ° °Neurological: °Negative for neurological symptoms ° °Psychologic: °Negative for psychiatric symptoms ° °

## 2020-12-07 NOTE — Patient Instructions (Signed)
Benign Prostatic Hyperplasia Benign prostatic hyperplasia (BPH) is an enlarged prostate gland that is caused by the normal aging process and not by cancer. The prostate is a walnut-sized gland that is involved in the production of semen. It is located in front of the rectum and below the bladder. The bladder stores urine and the urethra is the tube that carries the urine out of the body. The prostate may get bigger as a man gets older. An enlarged prostate can press on the urethra. This can make it harder to pass urine. The build-up of urine in the bladder can cause infection. Back pressure and infection may progress to bladder damage and kidney (renal) failure. What are the causes? This condition is part of a normal aging process. However, not all men develop problems from this condition. If the prostate enlarges away from the urethra, urine flow will not be blocked. If it enlarges toward the urethra and compresses it, there will be problems passing urine. What increases the risk? This condition is more likely to develop in men over the age of 50 years. What are the signs or symptoms? Symptoms of this condition include: Getting up often during the night to urinate. Needing to urinate frequently during the day. Difficulty starting urine flow. Decrease in size and strength of your urine stream. Leaking (dribbling) after urinating. Inability to pass urine. This needs immediate treatment. Inability to completely empty your bladder. Pain when you pass urine. This is more common if there is also an infection. Urinary tract infection (UTI). How is this diagnosed? This condition is diagnosed based on your medical history, a physical exam, and your symptoms. Tests will also be done, such as: A post-void bladder scan. This measures any amount of urine that may remain in your bladder after you finish urinating. A digital rectal exam. In a rectal exam, your health care provider checks your prostate by  putting a lubricated, gloved finger into your rectum to feel the back of your prostate gland. This exam detects the size of your gland and any abnormal lumps or growths. An exam of your urine (urinalysis). A prostate specific antigen (PSA) screening. This is a blood test used to screen for prostate cancer. An ultrasound. This test uses sound waves to electronically produce a picture of your prostate gland. Your health care provider may refer you to a specialist in kidney and prostate diseases (urologist). How is this treated? Once symptoms begin, your health care provider will monitor your condition (active surveillance or watchful waiting). Treatment for this condition will depend on the severity of your condition. Treatment may include: Observation and yearly exams. This may be the only treatment needed if your condition and symptoms are mild. Medicines to relieve your symptoms, including: Medicines to shrink the prostate. Medicines to relax the muscle of the prostate. Surgery in severe cases. Surgery may include: Prostatectomy. In this procedure, the prostate tissue is removed completely through an open incision or with a laparoscope or robotics. Transurethral resection of the prostate (TURP). In this procedure, a tool is inserted through the opening at the tip of the penis (urethra). It is used to cut away tissue of the inner core of the prostate. The pieces are removed through the same opening of the penis. This removes the blockage. Transurethral incision (TUIP). In this procedure, small cuts are made in the prostate. This lessens the prostate's pressure on the urethra. Transurethral microwave thermotherapy (TUMT). This procedure uses microwaves to create heat. The heat destroys and removes a   small amount of prostate tissue. Transurethral needle ablation (TUNA). This procedure uses radio frequencies to destroy and remove a small amount of prostate tissue. Interstitial laser coagulation (ILC).  This procedure uses a laser to destroy and remove a small amount of prostate tissue. Transurethral electrovaporization (TUVP). This procedure uses electrodes to destroy and remove a small amount of prostate tissue. Prostatic urethral lift. This procedure inserts an implant to push the lobes of the prostate away from the urethra. Follow these instructions at home: Take over-the-counter and prescription medicines only as told by your health care provider. Monitor your symptoms for any changes. Contact your health care provider with any changes. Avoid drinking large amounts of liquid before going to bed or out in public. Avoid or reduce how much caffeine or alcohol you drink. Give yourself time when you urinate. Keep all follow-up visits as told by your health care provider. This is important. Contact a health care provider if: You have unexplained back pain. Your symptoms do not get better with treatment. You develop side effects from the medicine you are taking. Your urine becomes very dark or has a bad smell. Your lower abdomen becomes distended and you have trouble passing your urine. Get help right away if: You have a fever or chills. You suddenly cannot urinate. You feel lightheaded, or very dizzy, or you faint. There are large amounts of blood or clots in the urine. Your urinary problems become hard to manage. You develop moderate to severe low back or flank pain. The flank is the side of your body between the ribs and the hip. These symptoms may represent a serious problem that is an emergency. Do not wait to see if the symptoms will go away. Get medical help right away. Call your local emergency services (911 in the [place] Do not drive yourself to the hospital. Summary Benign prostatic hyperplasia (BPH) is an enlarged prostate that is caused by the normal aging process and not by cancer. An enlarged prostate can press on the urethra. This can make it hard to pass urine. This  condition is part of a normal aging process and is more likely to develop in men over the age of 50 years. Get help right away if you suddenly cannot urinate. This information is not intended to replace advice given to you by your health care provider. Make sure you discuss any questions you have with your health care provider. Document Revised: 05/09/2020 Document Reviewed: 10/06/2019 Elsevier Patient Education  2022 [employer]  

## 2020-12-07 NOTE — Progress Notes (Signed)
12/07/2020 10:51 AM   Ronald [name] [birth year] [id number]  Referring provider: Celene Squibb, MD [address],  [place] [address]  Followup BPh and Prostate cancer   HPI: [name] is 80yo here for followup for BPH and prostate cancer. He stopped flomax 0.4mg  yesterday. PSa 0.5 up from 0.2. IPSS 9 QOl 2. He was happy on his flomax but ran out of the prescription. Urine stream strong. Nocturia 1-2x. No straining to urinate.    PMH: Past Medical History:  Diagnosis Date   GERD (gastroesophageal reflux disease)    Hypertension    Myocardial infarction [facility])    1999   Prostate cancer ([place]) 02/2018   Renal insufficiency    Stroke [facility])    no deficits; had stroke while trying to place "stents in legs".    Surgical History: Past Surgical History:  Procedure Laterality Date   ABDOMINAL AORTIC ANEURYSM REPAIR  2012   IN [place]   APPENDECTOMY     AV FISTULA PLACEMENT Left 10/16/2020   Procedure: LEFT ARM ARTERIOVENOUS (AV) FISTULA CREATION;  Surgeon: Rosetta Posner, MD;  Location: [facility];  Service: Vascular;  Laterality: Left;   CORONARY ARTERY BYPASS GRAFT  01/1998   3 vessels 18 years ago   [place] Right 09/20/2015   Procedure: REPAIR RIGHT INGUINAL HERNIA  WITH MESH;  Surgeon: Vickie Epley, MD;  Location: [facility];  Service: General;  Laterality: Right;   INSERTION OF DIALYSIS CATHETER Right 08/02/2020   Procedure: INSERTION OF RIGHT TUNNELED INTERNAL JUGULAR  DIALYSIS CATHETER;  Surgeon: Rosetta Posner, MD;  Location: [facility];  Service: Vascular;  Laterality: Right;   tumor removal from bladder      Home Medications:  Allergies as of 12/07/2020       Reactions   Tums [calcium Carbonate]    cramps        Medication List        Accurate as of December 07, 2020 10:51 AM. If you have any questions, ask your nurse or doctor.          STOP taking these medications    traMADol 50 MG tablet Commonly known as:  ULTRAM Stopped by: Nicolette Bang, MD       TAKE these medications    atorvastatin 80 MG tablet Commonly known as: LIPITOR Take 80 mg by mouth daily.   carvedilol 6.25 MG tablet Commonly known as: COREG Take 6.25 mg by mouth 2 (two) times daily.   MAGNESIUM CITRATE PO Take 400 mg by mouth at bedtime.   MAGNESIUM-POTASSIUM PO Take 1 tablet by mouth daily.   tamsulosin 0.4 MG Caps capsule Commonly known as: FLOMAX Take 1 capsule (0.4 mg total) by mouth daily after supper.   varenicline 0.5 MG tablet Commonly known as: CHANTIX Take 0.5 mg by mouth 2 (two) times daily.   zolpidem 10 MG tablet Commonly known as: AMBIEN Take 10 mg by mouth at bedtime as needed for sleep.        Allergies:  Allergies  Allergen Reactions   Tums [Calcium Carbonate]     cramps    Family History: Family History  Problem Relation Age of Onset   Cancer Father     Social History:  reports that he has quit smoking. His smoking use included cigarettes. He started smoking about 63 years ago. He has a 100.00 pack-year smoking history. He has never used smokeless tobacco. He reports that he does not drink  alcohol and does not use drugs.  ROS: All other review of systems were reviewed and are negative except what is noted above in HPI  Physical Exam: BP 138/63   Pulse 78   Temp 98.6 F (37 C)   Constitutional:  Alert and oriented, No acute distress. HEENT: [place] AT, moist mucus membranes.  Trachea midline, no masses. Cardiovascular: No clubbing, cyanosis, or edema. Respiratory: Normal respiratory effort, no increased work of breathing. GI: Abdomen is soft, nontender, nondistended, no abdominal masses GU: No CVA tenderness.  Lymph: No cervical or inguinal lymphadenopathy. Skin: No rashes, bruises or suspicious lesions. Neurologic: Grossly intact, no focal deficits, moving all 4 extremities. Psychiatric: Normal mood and affect.  Laboratory Data: Lab Results  Component Value Date    WBC 4.7 08/10/2019   HGB 11.9 (L) 10/16/2020   HCT 35.0 (L) 10/16/2020   MCV 90.4 08/10/2019   PLT 160 08/10/2019    Lab Results  Component Value Date   CREATININE 6.50 (H) 10/16/2020    Lab Results  Component Value Date   PSA <0.1 03/15/2019    Lab Results  Component Value Date   TESTOSTERONE 124 (L) 03/26/2020    No results found for: HGBA1C  Urinalysis    Component Value Date/Time   APPEARANCEUR Clear 04/20/2020 0952   GLUCOSEU Negative 04/20/2020 0952   BILIRUBINUR Negative 04/20/2020 0952   PROTEINUR 3+ (A) 04/20/2020 0952   UROBILINOGEN 0.2 05/17/2019 0917   NITRITE Negative 04/20/2020 0952   LEUKOCYTESUR Negative 04/20/2020 0952    Lab Results  Component Value Date   LABMICR See below: 04/20/2020   WBCUA 6-10 (A) 04/20/2020   LABEPIT 0-10 04/20/2020   MUCUS Present 03/27/2020   BACTERIA Few 04/20/2020    Pertinent Imaging:  Results for orders placed during the hospital encounter of 03/29/18  DG Abd 1 View  Narrative CLINICAL DATA:  Constipation for a few weeks.  EXAM: ABDOMEN - 1 VIEW  COMPARISON:  None.  FINDINGS: Normal bowel gas pattern without evidence of obstruction.  Mild generalized increased colonic stool burden.  No evidence of renal or ureteral stones.  Aortic stent graft extends from the lower aspect of L2 to common iliac artery arms over the upper sacrum.  Skeletal structures intact.  IMPRESSION: 1. No acute findings.  No evidence of bowel obstruction. 2. Mild generalized increased colonic stool burden.   Electronically Signed By: Lajean Manes M.D. On: 03/30/2018 08:52  No results found for this or any previous visit.  No results found for this or any previous visit.  No results found for this or any previous visit.  Results for orders placed during the hospital encounter of 07/14/16  US Renal  Narrative CLINICAL DATA:  Protein urea, chronic renal insufficiency, assess postvoid residual  volume  EXAM: RENAL / URINARY TRACT ULTRASOUND COMPLETE  COMPARISON:  Renal ultrasound of December 22, 2014  FINDINGS: Right Kidney:  Length: 12.2 cm. The renal cortical echotexture is somewhat greater than that of the adjacent liver. Multiple cysts of present 2.7 in diameter. There is no hydronephrosis.  Left Kidney:  Length: 11.3 cm. The renal cortical echotexture is similar to that of the right kidney. There are multiple cystic structures measuring up to 1.4 cm in diameter. There is no hydronephrosis.  Bladder:  Urinary bladder exhibits diffuse wall thickening. The prostate gland produces a prominent impression upon the bladder base. The prevoid volume was 187 cc. The postvoid volume is 28 cc.  IMPRESSION: Multiple simple appearing renal cysts more conspicuous  than on the previous study. Increased renal cortical echotexture consistent with medical renal disease. No hydronephrosis.  Thickened gallbladder wall.  28 cc postvoid residual volume.   Electronically Signed By: David  Martinique M.D. On: 07/14/2016 11:40  No results found for this or any previous visit.  No results found for this or any previous visit.  No results found for this or any previous visit.   Assessment & Plan:    1. Weak urinary stream -restart flomax 0.4mg  daily - Urinalysis, Routine w reflex microscopic  2. Benign prostatic hyperplasia with urinary obstruction -Restart flomax 0.4mg  daily  3. Prostate cancer ([place]) -RTC 6 months with PSA - tamsulosin (FLOMAX) 0.4 MG CAPS capsule; Take 1 capsule (0.4 mg total) by mouth daily after supper.  Dispense: 90 capsule; Refill: 3   No follow-ups on file.  Nicolette Bang, MD  [facility] [place]

## 2020-12-10 DIAGNOSIS — Z992 Dependence on renal dialysis: Secondary | ICD-10-CM | POA: Diagnosis not present

## 2020-12-10 DIAGNOSIS — K219 Gastro-esophageal reflux disease without esophagitis: Secondary | ICD-10-CM | POA: Diagnosis not present

## 2020-12-10 DIAGNOSIS — I10 Essential (primary) hypertension: Secondary | ICD-10-CM | POA: Diagnosis not present

## 2020-12-10 DIAGNOSIS — N186 End stage renal disease: Secondary | ICD-10-CM | POA: Diagnosis not present

## 2020-12-12 DIAGNOSIS — N2581 Secondary hyperparathyroidism of renal origin: Secondary | ICD-10-CM | POA: Diagnosis not present

## 2020-12-12 DIAGNOSIS — Z992 Dependence on renal dialysis: Secondary | ICD-10-CM | POA: Diagnosis not present

## 2020-12-12 DIAGNOSIS — N186 End stage renal disease: Secondary | ICD-10-CM | POA: Diagnosis not present

## 2020-12-14 DIAGNOSIS — Z992 Dependence on renal dialysis: Secondary | ICD-10-CM | POA: Diagnosis not present

## 2020-12-14 DIAGNOSIS — N186 End stage renal disease: Secondary | ICD-10-CM | POA: Diagnosis not present

## 2020-12-17 DIAGNOSIS — Z992 Dependence on renal dialysis: Secondary | ICD-10-CM | POA: Diagnosis not present

## 2020-12-17 DIAGNOSIS — N186 End stage renal disease: Secondary | ICD-10-CM | POA: Diagnosis not present

## 2020-12-17 DIAGNOSIS — N2581 Secondary hyperparathyroidism of renal origin: Secondary | ICD-10-CM | POA: Diagnosis not present

## 2020-12-19 DIAGNOSIS — N186 End stage renal disease: Secondary | ICD-10-CM | POA: Diagnosis not present

## 2020-12-19 DIAGNOSIS — Z992 Dependence on renal dialysis: Secondary | ICD-10-CM | POA: Diagnosis not present

## 2020-12-21 DIAGNOSIS — N186 End stage renal disease: Secondary | ICD-10-CM | POA: Diagnosis not present

## 2020-12-21 DIAGNOSIS — Z992 Dependence on renal dialysis: Secondary | ICD-10-CM | POA: Diagnosis not present

## 2020-12-25 DIAGNOSIS — Z992 Dependence on renal dialysis: Secondary | ICD-10-CM | POA: Diagnosis not present

## 2020-12-25 DIAGNOSIS — N2581 Secondary hyperparathyroidism of renal origin: Secondary | ICD-10-CM | POA: Diagnosis not present

## 2020-12-25 DIAGNOSIS — N186 End stage renal disease: Secondary | ICD-10-CM | POA: Diagnosis not present

## 2020-12-27 DIAGNOSIS — N186 End stage renal disease: Secondary | ICD-10-CM | POA: Diagnosis not present

## 2020-12-27 DIAGNOSIS — Z992 Dependence on renal dialysis: Secondary | ICD-10-CM | POA: Diagnosis not present

## 2021-01-01 DIAGNOSIS — Z992 Dependence on renal dialysis: Secondary | ICD-10-CM | POA: Diagnosis not present

## 2021-01-01 DIAGNOSIS — N186 End stage renal disease: Secondary | ICD-10-CM | POA: Diagnosis not present

## 2021-01-03 DIAGNOSIS — N186 End stage renal disease: Secondary | ICD-10-CM | POA: Diagnosis not present

## 2021-01-03 DIAGNOSIS — Z992 Dependence on renal dialysis: Secondary | ICD-10-CM | POA: Diagnosis not present

## 2021-01-05 DIAGNOSIS — N186 End stage renal disease: Secondary | ICD-10-CM | POA: Diagnosis not present

## 2021-01-05 DIAGNOSIS — Z992 Dependence on renal dialysis: Secondary | ICD-10-CM | POA: Diagnosis not present

## 2021-01-06 NOTE — Telephone Encounter (Signed)
Recommedations documented in chart.

## 2021-01-08 DIAGNOSIS — N186 End stage renal disease: Secondary | ICD-10-CM | POA: Diagnosis not present

## 2021-01-08 DIAGNOSIS — Z992 Dependence on renal dialysis: Secondary | ICD-10-CM | POA: Diagnosis not present

## 2021-01-08 DIAGNOSIS — N2581 Secondary hyperparathyroidism of renal origin: Secondary | ICD-10-CM | POA: Diagnosis not present

## 2021-01-09 DIAGNOSIS — Z992 Dependence on renal dialysis: Secondary | ICD-10-CM | POA: Diagnosis not present

## 2021-01-09 DIAGNOSIS — N186 End stage renal disease: Secondary | ICD-10-CM | POA: Diagnosis not present

## 2021-01-10 DIAGNOSIS — Z23 Encounter for immunization: Secondary | ICD-10-CM | POA: Diagnosis not present

## 2021-01-10 DIAGNOSIS — N186 End stage renal disease: Secondary | ICD-10-CM | POA: Diagnosis not present

## 2021-01-10 DIAGNOSIS — Z992 Dependence on renal dialysis: Secondary | ICD-10-CM | POA: Diagnosis not present

## 2021-01-12 DIAGNOSIS — Z992 Dependence on renal dialysis: Secondary | ICD-10-CM | POA: Diagnosis not present

## 2021-01-12 DIAGNOSIS — Z23 Encounter for immunization: Secondary | ICD-10-CM | POA: Diagnosis not present

## 2021-01-12 DIAGNOSIS — N186 End stage renal disease: Secondary | ICD-10-CM | POA: Diagnosis not present

## 2021-01-15 DIAGNOSIS — Z992 Dependence on renal dialysis: Secondary | ICD-10-CM | POA: Diagnosis not present

## 2021-01-15 DIAGNOSIS — N186 End stage renal disease: Secondary | ICD-10-CM | POA: Diagnosis not present

## 2021-01-15 DIAGNOSIS — Z23 Encounter for immunization: Secondary | ICD-10-CM | POA: Diagnosis not present

## 2021-01-17 DIAGNOSIS — Z23 Encounter for immunization: Secondary | ICD-10-CM | POA: Diagnosis not present

## 2021-01-17 DIAGNOSIS — Z992 Dependence on renal dialysis: Secondary | ICD-10-CM | POA: Diagnosis not present

## 2021-01-17 DIAGNOSIS — N186 End stage renal disease: Secondary | ICD-10-CM | POA: Diagnosis not present

## 2021-01-19 DIAGNOSIS — N186 End stage renal disease: Secondary | ICD-10-CM | POA: Diagnosis not present

## 2021-01-19 DIAGNOSIS — Z992 Dependence on renal dialysis: Secondary | ICD-10-CM | POA: Diagnosis not present

## 2021-01-19 DIAGNOSIS — Z23 Encounter for immunization: Secondary | ICD-10-CM | POA: Diagnosis not present

## 2021-01-22 DIAGNOSIS — N186 End stage renal disease: Secondary | ICD-10-CM | POA: Diagnosis not present

## 2021-01-22 DIAGNOSIS — Z992 Dependence on renal dialysis: Secondary | ICD-10-CM | POA: Diagnosis not present

## 2021-01-22 DIAGNOSIS — Z23 Encounter for immunization: Secondary | ICD-10-CM | POA: Diagnosis not present

## 2021-01-22 DIAGNOSIS — N2581 Secondary hyperparathyroidism of renal origin: Secondary | ICD-10-CM | POA: Diagnosis not present

## 2021-01-24 DIAGNOSIS — Z992 Dependence on renal dialysis: Secondary | ICD-10-CM | POA: Diagnosis not present

## 2021-01-24 DIAGNOSIS — N186 End stage renal disease: Secondary | ICD-10-CM | POA: Diagnosis not present

## 2021-01-24 DIAGNOSIS — Z23 Encounter for immunization: Secondary | ICD-10-CM | POA: Diagnosis not present

## 2021-01-26 DIAGNOSIS — Z23 Encounter for immunization: Secondary | ICD-10-CM | POA: Diagnosis not present

## 2021-01-26 DIAGNOSIS — N186 End stage renal disease: Secondary | ICD-10-CM | POA: Diagnosis not present

## 2021-01-26 DIAGNOSIS — Z992 Dependence on renal dialysis: Secondary | ICD-10-CM | POA: Diagnosis not present

## 2021-01-28 ENCOUNTER — Ambulatory Visit: Payer: Medicare Other | Admitting: Cardiovascular Disease

## 2021-01-29 DIAGNOSIS — N186 End stage renal disease: Secondary | ICD-10-CM | POA: Diagnosis not present

## 2021-01-29 DIAGNOSIS — Z23 Encounter for immunization: Secondary | ICD-10-CM | POA: Diagnosis not present

## 2021-01-29 DIAGNOSIS — Z992 Dependence on renal dialysis: Secondary | ICD-10-CM | POA: Diagnosis not present

## 2021-01-31 DIAGNOSIS — Z23 Encounter for immunization: Secondary | ICD-10-CM | POA: Diagnosis not present

## 2021-01-31 DIAGNOSIS — N186 End stage renal disease: Secondary | ICD-10-CM | POA: Diagnosis not present

## 2021-01-31 DIAGNOSIS — Z992 Dependence on renal dialysis: Secondary | ICD-10-CM | POA: Diagnosis not present

## 2021-02-05 DIAGNOSIS — Z992 Dependence on renal dialysis: Secondary | ICD-10-CM | POA: Diagnosis not present

## 2021-02-05 DIAGNOSIS — N2581 Secondary hyperparathyroidism of renal origin: Secondary | ICD-10-CM | POA: Diagnosis not present

## 2021-02-05 DIAGNOSIS — N186 End stage renal disease: Secondary | ICD-10-CM | POA: Diagnosis not present

## 2021-02-05 DIAGNOSIS — Z23 Encounter for immunization: Secondary | ICD-10-CM | POA: Diagnosis not present

## 2021-02-07 DIAGNOSIS — Z23 Encounter for immunization: Secondary | ICD-10-CM | POA: Diagnosis not present

## 2021-02-07 DIAGNOSIS — Z992 Dependence on renal dialysis: Secondary | ICD-10-CM | POA: Diagnosis not present

## 2021-02-07 DIAGNOSIS — N186 End stage renal disease: Secondary | ICD-10-CM | POA: Diagnosis not present

## 2021-02-09 DIAGNOSIS — N186 End stage renal disease: Secondary | ICD-10-CM | POA: Diagnosis not present

## 2021-02-09 DIAGNOSIS — Z23 Encounter for immunization: Secondary | ICD-10-CM | POA: Diagnosis not present

## 2021-02-09 DIAGNOSIS — Z992 Dependence on renal dialysis: Secondary | ICD-10-CM | POA: Diagnosis not present

## 2021-03-13 ENCOUNTER — Telehealth: Payer: Self-pay

## 2021-03-13 NOTE — Telephone Encounter (Signed)
Ramiro Harvest, PA from [facility] called to schedule pt for catheter removal with Dr. Donnetta Hutching.

## 2021-03-14 ENCOUNTER — Other Ambulatory Visit: Payer: Self-pay

## 2021-03-14 DIAGNOSIS — I714 Abdominal aortic aneurysm, without rupture, unspecified: Secondary | ICD-10-CM | POA: Insufficient documentation

## 2021-03-14 DIAGNOSIS — G894 Chronic pain syndrome: Secondary | ICD-10-CM | POA: Insufficient documentation

## 2021-03-14 DIAGNOSIS — R197 Diarrhea, unspecified: Secondary | ICD-10-CM | POA: Insufficient documentation

## 2021-03-14 DIAGNOSIS — M545 Low back pain, unspecified: Secondary | ICD-10-CM | POA: Insufficient documentation

## 2021-03-14 DIAGNOSIS — I2510 Atherosclerotic heart disease of native coronary artery without angina pectoris: Secondary | ICD-10-CM | POA: Insufficient documentation

## 2021-03-14 DIAGNOSIS — N2581 Secondary hyperparathyroidism of renal origin: Secondary | ICD-10-CM | POA: Insufficient documentation

## 2021-03-14 DIAGNOSIS — N186 End stage renal disease: Secondary | ICD-10-CM | POA: Diagnosis present

## 2021-03-14 DIAGNOSIS — G8929 Other chronic pain: Secondary | ICD-10-CM | POA: Insufficient documentation

## 2021-03-14 DIAGNOSIS — K219 Gastro-esophageal reflux disease without esophagitis: Secondary | ICD-10-CM | POA: Insufficient documentation

## 2021-03-14 DIAGNOSIS — F5104 Psychophysiologic insomnia: Secondary | ICD-10-CM | POA: Insufficient documentation

## 2021-03-14 DIAGNOSIS — E782 Mixed hyperlipidemia: Secondary | ICD-10-CM | POA: Insufficient documentation

## 2021-03-19 ENCOUNTER — Ambulatory Visit (HOSPITAL_COMMUNITY)
Admission: RE | Admit: 2021-03-19 | Discharge: 2021-03-19 | Disposition: A | Discharge status: Home / Self Care | Payer: Medicare Other | Attending: Vascular Surgery | Admitting: Vascular Surgery

## 2021-03-19 ENCOUNTER — Encounter (HOSPITAL_COMMUNITY)
Admission: RE | Disposition: A | Discharge status: Home / Self Care | Payer: Self-pay | Source: Home / Self Care | Attending: Vascular Surgery

## 2021-03-19 DIAGNOSIS — Z992 Dependence on renal dialysis: Secondary | ICD-10-CM

## 2021-03-19 DIAGNOSIS — N186 End stage renal disease: Secondary | ICD-10-CM | POA: Diagnosis not present

## 2021-03-19 DIAGNOSIS — Z4901 Encounter for fitting and adjustment of extracorporeal dialysis catheter: Secondary | ICD-10-CM

## 2021-03-19 HISTORY — PX: REMOVAL OF A DIALYSIS CATHETER: SHX6053

## 2021-03-19 SURGERY — REMOVAL, DIALYSIS CATHETER
Anesthesia: LOCAL

## 2021-03-19 MED ORDER — LIDOCAINE HCL (PF) 1 % IJ SOLN
INTRAMUSCULAR | Status: AC
  Filled 2021-03-19: qty 30

## 2021-03-19 SURGICAL SUPPLY — 8 items
APPLICATOR CHLORAPREP 10.5 ORG (MISCELLANEOUS) ×2 IMPLANT
GLOVE SURG POLYISO LF SZ7.5 (GLOVE) ×1 IMPLANT
GLOVE SURG UNDER POLY LF SZ7 (GLOVE) ×1 IMPLANT
NDL HYPO 25X1 1.5 SAFETY (NEEDLE) ×1 IMPLANT
NEEDLE HYPO 25X1 1.5 SAFETY (NEEDLE) ×2 IMPLANT
SPONGE GAUZE 2X2 8PLY STRL LF (GAUZE/BANDAGES/DRESSINGS) ×2 IMPLANT
SYR CONTROL 10ML LL (SYRINGE) ×2 IMPLANT
TOWEL OR 17X26 4PK STRL BLUE (TOWEL DISPOSABLE) ×2 IMPLANT

## 2021-03-19 NOTE — Op Note (Signed)
° ° °  OPERATIVE REPORT  DATE OF SURGERY: 03/19/2021  PATIENT: Ronald [name], 81 y.o. male MRN: [id number]  DOB: Nov 02, 1940  PRE-OPERATIVE DIAGNOSIS: End-stage renal disease with functioning right arm fistula  POST-OPERATIVE DIAGNOSIS:  Same  PROCEDURE: Removal of tunneled hemodialysis catheter right IJ  SURGEON:  Curt Jews, M.D.  PHYSICIAN ASSISTANT: Nurse  The assistant was needed for exposure and to expedite the case  ANESTHESIA: 1% lidocaine local  EBL: per anesthesia record  No intake/output data recorded.  BLOOD ADMINISTERED: none  DRAINS: none  SPECIMEN: none  COUNTS CORRECT:  YES  PATIENT DISPOSITION:  PACU - hemodynamically stable  PROCEDURE DETAILS: The area of the neck and chest around the right IJ tunneled catheter was prepped in the usual sterile fashion.  1% lidocaine local was used to instill the exit site and the area around the felt cuff on the catheter.  Traction on the catheter was placed and the hemostat was used to mobilize the felt cuff in the tunnel.  The catheter was removed in its entirety and pressure was held for hemostasis.  A sterile dressing was applied and the patient was discharged to home   Rosetta Posner, M.D., [facility] 03/19/2021 12:55 PM  Note: Portions of this report may have been transcribed using voice recognition software.  Every effort has been made to ensure accuracy; however, inadvertent computerized transcription errors may still be present.

## 2021-03-20 ENCOUNTER — Encounter (HOSPITAL_COMMUNITY): Payer: Self-pay | Admitting: Vascular Surgery

## 2021-03-20 NOTE — H&P (Signed)
Office Visit 11/21/2020 [facility] Braleigh Massoud, Arvilla Meres, MD Vascular Surgery ESRD on dialysis [facility]) Dx Routine Post Op ; Referred by Celene Squibb, MD Reason for Visit   Additional Documentation  Vitals:  BP 163/66 Important   (BP Location: Right Arm, Patient Position: Sitting, Cuff Size: Normal) Pulse 51 Important   Temp 97.6 F (36.4 C) (Temporal) Resp 18 Ht 6\' 2"  (1.88 m) Wt 91.6 kg SpO2 99% BMI 25.94 kg/m BSA 2.19 m  Flowsheets:  Infectious Disease Screening, Clinical Intake, Vital Signs, NEWS, MEWS Score, Anthropometrics, Method of Visit   Encounter Info:  Billing Info, History, Allergies, Detailed Report    All Notes   Progress Notes by Rosetta Posner, MD at 11/21/2020 1:00 PM  Author: Rosetta Posner, MD Author Type: Physician Filed: 11/21/2020  1:41 PM  Note Status: Signed Cosign: Cosign Not Required Encounter Date: 11/21/2020  Editor: Rosetta Posner, MD (Physician)                                                  [facility] [place]   Patient name: Ronald [name] MRN: [id number]        DOB: May 23, 1940          Sex: male   REASON FOR VISIT: Follow-up left radiocephalic fistula   HPI: Ronald [name] is a 81 y.o. male here today for follow-up.  He currently has dialysis via right IJ catheter and reports no difficulty with this.  He underwent left radiocephalic AV fistula creation by myself on 10/16/2020.  He has had no difficulty with this.  He reports no steal symptoms.         Current Outpatient Medications  Medication Sig Dispense Refill   atorvastatin (LIPITOR) 80 MG tablet Take 80 mg by mouth daily.       carvedilol (COREG) 6.25 MG tablet Take 6.25 mg by mouth 2 (two) times daily.       MAGNESIUM CITRATE PO Take 400 mg by mouth at bedtime.       MAGNESIUM-POTASSIUM PO Take 1 tablet by mouth daily.       tamsulosin (FLOMAX) 0.4 MG CAPS capsule Take 1 capsule (0.4 mg total) by mouth daily after  supper. 90 capsule 3   traMADol (ULTRAM) 50 MG tablet Take 50 mg by mouth 2 (two) times daily as needed for moderate pain.       varenicline (CHANTIX) 0.5 MG tablet Take 0.5 mg by mouth 2 (two) times daily.       zolpidem (AMBIEN) 10 MG tablet Take 10 mg by mouth at bedtime as needed for sleep.        No current facility-administered medications for this visit.        PHYSICAL EXAM:    Vitals:    11/21/20 1316  BP: (!) 163/66  Pulse: (!) 51  Resp: 18  Temp: 97.6 F (36.4 C)  TempSrc: Temporal  SpO2: 99%  Weight: 202 lb (91.6 kg)  Height: 6\' 2"  (1.88 m)      GENERAL: The patient is a well-nourished male, in no acute distress. The vital signs are documented above. Excellent Ronald [name] maturation with very large caliber cephalic vein throughout the forearm.  The vein is dilated into the basilic and cephalic veins at the antecubital space and proximally.   MEDICAL  ISSUES: Excellent Ronald [name] maturation of left radiocephalic fistula.  Size is adequate for access currently but would prefer to wait a total of 12 weeks if possible for maturation of the fistula.  Should be able to use the fistula at that time.  If he has catheter malfunction could use the fistula at 10 weeks if necessary.  He will see [place] again on an as-needed basis.  I am available for catheter removal at [facility] after he is successfully used his fistula     Rosetta Posner, MD FACS [facility] [place] Office Tel [phone]   Note: Portions of this report may have been transcribed using voice recognition software.  Every effort has been made to ensure accuracy; however, inadvertent computerized transcription errors may still be present.        Addendum:  The patient has been re-examined and re-evaluated.  The patient's history and physical has been reviewed and is unchanged.    Ronald [name] is a 82 y.o. male is being admitted with ESRD. All the risks, benefits and other  treatment options have been discussed with the patient. The patient has consented to proceed with Procedure(s): MINOR REMOVAL OF A TUNNELED DIALYSIS CATHETER as a surgical intervention.  Ronald [name] 03/20/2021 12:32 PM Vascular and Vein Surgery

## 2021-05-11 DIAGNOSIS — Z992 Dependence on renal dialysis: Secondary | ICD-10-CM | POA: Diagnosis not present

## 2021-05-11 DIAGNOSIS — N186 End stage renal disease: Secondary | ICD-10-CM | POA: Diagnosis not present

## 2021-05-14 DIAGNOSIS — Z992 Dependence on renal dialysis: Secondary | ICD-10-CM | POA: Diagnosis not present

## 2021-05-14 DIAGNOSIS — N186 End stage renal disease: Secondary | ICD-10-CM | POA: Diagnosis not present

## 2021-05-16 DIAGNOSIS — N186 End stage renal disease: Secondary | ICD-10-CM | POA: Diagnosis not present

## 2021-05-16 DIAGNOSIS — Z992 Dependence on renal dialysis: Secondary | ICD-10-CM | POA: Diagnosis not present

## 2021-05-18 DIAGNOSIS — N186 End stage renal disease: Secondary | ICD-10-CM | POA: Diagnosis not present

## 2021-05-18 DIAGNOSIS — Z992 Dependence on renal dialysis: Secondary | ICD-10-CM | POA: Diagnosis not present

## 2021-05-21 DIAGNOSIS — Z992 Dependence on renal dialysis: Secondary | ICD-10-CM | POA: Diagnosis not present

## 2021-05-21 DIAGNOSIS — N186 End stage renal disease: Secondary | ICD-10-CM | POA: Diagnosis not present

## 2021-05-23 DIAGNOSIS — Z992 Dependence on renal dialysis: Secondary | ICD-10-CM | POA: Diagnosis not present

## 2021-05-23 DIAGNOSIS — N186 End stage renal disease: Secondary | ICD-10-CM | POA: Diagnosis not present

## 2021-05-25 DIAGNOSIS — N186 End stage renal disease: Secondary | ICD-10-CM | POA: Diagnosis not present

## 2021-05-25 DIAGNOSIS — Z992 Dependence on renal dialysis: Secondary | ICD-10-CM | POA: Diagnosis not present

## 2021-05-28 DIAGNOSIS — N186 End stage renal disease: Secondary | ICD-10-CM | POA: Diagnosis not present

## 2021-05-28 DIAGNOSIS — Z992 Dependence on renal dialysis: Secondary | ICD-10-CM | POA: Diagnosis not present

## 2021-05-30 DIAGNOSIS — Z992 Dependence on renal dialysis: Secondary | ICD-10-CM | POA: Diagnosis not present

## 2021-05-30 DIAGNOSIS — N186 End stage renal disease: Secondary | ICD-10-CM | POA: Diagnosis not present

## 2021-06-01 DIAGNOSIS — Z992 Dependence on renal dialysis: Secondary | ICD-10-CM | POA: Diagnosis not present

## 2021-06-01 DIAGNOSIS — N186 End stage renal disease: Secondary | ICD-10-CM | POA: Diagnosis not present

## 2021-06-04 DIAGNOSIS — N186 End stage renal disease: Secondary | ICD-10-CM | POA: Diagnosis not present

## 2021-06-04 DIAGNOSIS — Z992 Dependence on renal dialysis: Secondary | ICD-10-CM | POA: Diagnosis not present

## 2021-06-07 ENCOUNTER — Other Ambulatory Visit: Payer: Medicare Other

## 2021-06-08 DIAGNOSIS — Z992 Dependence on renal dialysis: Secondary | ICD-10-CM | POA: Diagnosis not present

## 2021-06-08 DIAGNOSIS — N186 End stage renal disease: Secondary | ICD-10-CM | POA: Diagnosis not present

## 2021-06-08 LAB — PSA: Prostate Specific Ag, Serum: 0.9 ng/mL (ref 0.0–4.0)

## 2021-06-09 DIAGNOSIS — N186 End stage renal disease: Secondary | ICD-10-CM | POA: Diagnosis not present

## 2021-06-09 DIAGNOSIS — Z992 Dependence on renal dialysis: Secondary | ICD-10-CM | POA: Diagnosis not present

## 2021-06-11 DIAGNOSIS — Z992 Dependence on renal dialysis: Secondary | ICD-10-CM | POA: Diagnosis not present

## 2021-06-11 DIAGNOSIS — N186 End stage renal disease: Secondary | ICD-10-CM | POA: Diagnosis not present

## 2021-06-13 DIAGNOSIS — N186 End stage renal disease: Secondary | ICD-10-CM | POA: Diagnosis not present

## 2021-06-13 DIAGNOSIS — Z992 Dependence on renal dialysis: Secondary | ICD-10-CM | POA: Diagnosis not present

## 2021-06-14 ENCOUNTER — Encounter: Payer: Self-pay | Admitting: Urology

## 2021-06-14 ENCOUNTER — Ambulatory Visit (INDEPENDENT_AMBULATORY_CARE_PROVIDER_SITE_OTHER): Payer: Medicare Other | Admitting: Urology

## 2021-06-14 VITALS — BP 173/64 | HR 80

## 2021-06-14 DIAGNOSIS — N401 Enlarged prostate with lower urinary tract symptoms: Secondary | ICD-10-CM | POA: Diagnosis not present

## 2021-06-14 DIAGNOSIS — R7301 Impaired fasting glucose: Secondary | ICD-10-CM | POA: Diagnosis not present

## 2021-06-14 DIAGNOSIS — R197 Diarrhea, unspecified: Secondary | ICD-10-CM | POA: Diagnosis not present

## 2021-06-14 DIAGNOSIS — E782 Mixed hyperlipidemia: Secondary | ICD-10-CM | POA: Diagnosis not present

## 2021-06-14 DIAGNOSIS — C61 Malignant neoplasm of prostate: Secondary | ICD-10-CM

## 2021-06-14 DIAGNOSIS — N138 Other obstructive and reflux uropathy: Secondary | ICD-10-CM

## 2021-06-14 DIAGNOSIS — R3912 Poor urinary stream: Secondary | ICD-10-CM | POA: Diagnosis not present

## 2021-06-14 DIAGNOSIS — Z8546 Personal history of malignant neoplasm of prostate: Secondary | ICD-10-CM | POA: Diagnosis not present

## 2021-06-14 LAB — MICROSCOPIC EXAMINATION
Bacteria, UA: NONE SEEN
Renal Epithel, UA: NONE SEEN /hpf
WBC, UA: NONE SEEN /hpf (ref 0–5)

## 2021-06-14 LAB — URINALYSIS, ROUTINE W REFLEX MICROSCOPIC
Bilirubin, UA: NEGATIVE
Glucose, UA: NEGATIVE
Ketones, UA: NEGATIVE
Leukocytes,UA: NEGATIVE
Nitrite, UA: NEGATIVE
Specific Gravity, UA: 1.015 (ref 1.005–1.030)
Urobilinogen, Ur: 0.2 mg/dL (ref 0.2–1.0)
pH, UA: 7.5 (ref 5.0–7.5)

## 2021-06-14 MED ORDER — TAMSULOSIN HCL 0.4 MG PO CAPS: 0.4000 mg | ORAL_CAPSULE | Freq: Every day | ORAL | 3 refills | Status: DC

## 2021-06-14 NOTE — Progress Notes (Signed)
? ?06/14/2021 ?9:22 AM  ? ?Ronald [name] ?[birth year] ?[id number] ? ?Referring provider: Celene Squibb, MD ?[address] ?Ronald [name] ?[place],  [place] [address] ? ?Followup BPH and prostate cancer ? ? ?HPI: ?Ronald [name] is a 81yo here for followup for BPH and prostate cancer. IPSS 13 QOl 2 on flomax 0.'4mg'$  daily. Urine stream improved on flomax 0.'4mg'$  daily. Nocturia 2-3 depending on fluid consumption. PSa increased to 0.9 from 0.5. No other complaints today ? ? ?PMH: ?Past Medical History:  ?Diagnosis Date  ? GERD (gastroesophageal reflux disease)   ? Hypertension   ? Myocardial infarction [facility])   ? 1999  ? Prostate cancer ([place]) 02/2018  ? Renal insufficiency   ? Stroke [facility])   ? no deficits; had stroke while trying to place "stents in legs".  ? ? ?Surgical History: ?Past Surgical History:  ?Procedure Laterality Date  ? ABDOMINAL AORTIC ANEURYSM REPAIR  2012  ? IN [place]  ? APPENDECTOMY    ? AV FISTULA PLACEMENT Left 10/16/2020  ? Procedure: LEFT ARM ARTERIOVENOUS (AV) FISTULA CREATION;  Surgeon: Rosetta Posner, MD;  Location: [facility];  Service: Vascular;  Laterality: Left;  ? CORONARY ARTERY BYPASS GRAFT  01/1998  ? 3 vessels 18 years ago  ? INGUINAL HERNIA REPAIR Right 09/20/2015  ? Procedure: REPAIR RIGHT INGUINAL HERNIA  WITH MESH;  Surgeon: Vickie Epley, MD;  Location: [facility];  Service: General;  Laterality: Right;  ? INSERTION OF DIALYSIS CATHETER Right 08/02/2020  ? Procedure: INSERTION OF RIGHT TUNNELED INTERNAL JUGULAR  DIALYSIS CATHETER;  Surgeon: Rosetta Posner, MD;  Location: [facility];  Service: Vascular;  Laterality: Right;  ? REMOVAL OF A DIALYSIS CATHETER N/A 03/19/2021  ? Procedure: MINOR REMOVAL OF A TUNNELED DIALYSIS CATHETER;  Surgeon: Rosetta Posner, MD;  Location: [facility];  Service: Vascular;  Laterality: N/A;  ? tumor removal from bladder    ? ? ?Home Medications:  ?Allergies as of 06/14/2021   ? ?   Reactions  ? Tums [calcium Carbonate] Other (See Comments)  ? Abdominal cramps  ? ?  ? ?  ?Medication  List  ?  ? ?  ? Accurate as of Jun 14, 2021  9:22 AM. If you have any questions, ask your nurse or doctor.  ?  ?  ? ?  ? ?atorvastatin 80 MG tablet ?Commonly known as: LIPITOR ?Take 80 mg by mouth in the morning. ?  ?calcium carbonate 1500 (600 Ca) MG Tabs tablet ?Commonly known as: OSCAL ?Take 600 mg by mouth in the morning. ?  ?carvedilol 12.5 MG tablet ?Commonly known as: COREG ?Take 6.25 mg by mouth 2 (two) times daily with a meal. ?  ?lidocaine-prilocaine cream ?Commonly known as: EMLA ?Apply 1 application topically as needed (prior to dialysis). ?  ?magnesium oxide 400 MG tablet ?Commonly known as: MAG-OX ?Take 400 mg by mouth every evening. ?  ?MAGNESIUM-POTASSIUM PO ?Take 1 tablet by mouth in the morning. 210 mg-99 mg ?  ?tamsulosin 0.4 MG Caps capsule ?Commonly known as: FLOMAX ?Take 1 capsule (0.4 mg total) by mouth daily after supper. ?  ?traMADol 50 MG tablet ?Commonly known as: ULTRAM ?Take 50 mg by mouth 3 (three) times daily as needed (pain.). ?  ?zolpidem 10 MG tablet ?Commonly known as: AMBIEN ?Take 10 mg by mouth at bedtime. ?  ? ?  ? ? ?Allergies:  ?Allergies  ?Allergen Reactions  ? Tums [Calcium Carbonate] Other (See Comments)  ?  Abdominal cramps  ? ? ?  Family History: ?Family History  ?Problem Relation Age of Onset  ? Cancer Father   ? ? ?Social History:  reports that he has quit smoking. His smoking use included cigarettes. He started smoking about 63 years ago. He has a 100.00 pack-year smoking history. He has never used smokeless tobacco. He reports that he does not drink alcohol and does not use drugs. ? ?ROS: ?All other review of systems were reviewed and are negative except what is noted above in HPI ? ?Physical Exam: ?BP (!) 173/64   Pulse 80   ?Constitutional:  Alert and oriented, No acute distress. ?HEENT: [place] AT, moist mucus membranes.  Trachea midline, no masses. ?Cardiovascular: No clubbing, cyanosis, or edema. ?Respiratory: Normal respiratory effort, no increased work of  breathing. ?GI: Abdomen is soft, nontender, nondistended, no abdominal masses ?GU: No CVA tenderness.  ?Lymph: No cervical or inguinal lymphadenopathy. ?Skin: No rashes, bruises or suspicious lesions. ?Neurologic: Grossly intact, no focal deficits, moving all 4 extremities. ?Psychiatric: Normal mood and affect. ? ?Laboratory Data: ?Lab Results  ?Component Value Date  ? WBC 4.7 08/10/2019  ? HGB 11.9 (L) 10/16/2020  ? HCT 35.0 (L) 10/16/2020  ? MCV 90.4 08/10/2019  ? PLT 160 08/10/2019  ? ? ?Lab Results  ?Component Value Date  ? CREATININE 6.50 (H) 10/16/2020  ? ? ?Lab Results  ?Component Value Date  ? PSA <0.1 03/15/2019  ? ? ?Lab Results  ?Component Value Date  ? TESTOSTERONE 124 (L) 03/26/2020  ? ? ?No results found for: HGBA1C ? ?Urinalysis ?   ?Component Value Date/Time  ? APPEARANCEUR Clear 12/07/2020 1018  ? GLUCOSEU Negative 12/07/2020 1018  ? BILIRUBINUR Negative 12/07/2020 1018  ? PROTEINUR 2+ (A) 12/07/2020 1018  ? UROBILINOGEN 0.2 05/17/2019 0917  ? NITRITE Negative 12/07/2020 1018  ? LEUKOCYTESUR Negative 12/07/2020 1018  ? ? ?Lab Results  ?Component Value Date  ? LABMICR See below: 12/07/2020  ? [place] None seen 12/07/2020  ? LABEPIT 0-10 12/07/2020  ? MUCUS Present 12/07/2020  ? BACTERIA None seen 12/07/2020  ? ? ?Pertinent Imaging: ? ?Results for orders placed during the hospital encounter of 03/29/18 ? ?DG Abd 1 View ? ?Narrative ?CLINICAL DATA:  Constipation for a few weeks. ? ?EXAM: ?ABDOMEN - 1 VIEW ? ?COMPARISON:  None. ? ?FINDINGS: ?Normal bowel gas pattern without evidence of obstruction. ? ?Mild generalized increased colonic stool burden. ? ?No evidence of renal or ureteral stones. ? ?Aortic stent graft extends from the lower aspect of L2 to common ?iliac artery arms over the upper sacrum. ? ?Skeletal structures intact. ? ?IMPRESSION: ?1. No acute findings.  No evidence of bowel obstruction. ?2. Mild generalized increased colonic stool burden. ? ? ?Electronically Signed ?By: Lajean Manes  M.D. ?On: 03/30/2018 08:52 ? ?No results found for this or any previous visit. ? ?No results found for this or any previous visit. ? ?No results found for this or any previous visit. ? ?Results for orders placed during the hospital encounter of 07/14/16 ? ?US Renal ? ?Narrative ?CLINICAL DATA:  Protein urea, chronic renal insufficiency, assess ?postvoid residual volume ? ?EXAM: ?RENAL / URINARY TRACT ULTRASOUND COMPLETE ? ?COMPARISON:  Renal ultrasound of December 22, 2014 ? ?FINDINGS: ?Right Kidney: ? ?Length: 12.2 cm. The renal cortical echotexture is somewhat greater ?than that of the adjacent liver. Multiple cysts of present 2.7 in ?diameter. There is no hydronephrosis. ? ?Left Kidney: ? ?Length: 11.3 cm. The renal cortical echotexture is similar to that ?of the right kidney. There are multiple  cystic structures measuring ?up to 1.4 cm in diameter. There is no hydronephrosis. ? ?Bladder: ? ?Urinary bladder exhibits diffuse wall thickening. The prostate gland ?produces a prominent impression upon the bladder base. The prevoid ?volume was 187 cc. The postvoid volume is 28 cc. ? ?IMPRESSION: ?Multiple simple appearing renal cysts more conspicuous than on the ?previous study. Increased renal cortical echotexture consistent with ?medical renal disease. No hydronephrosis. ? ?Thickened gallbladder wall.  28 cc postvoid residual volume. ? ? ?Electronically Signed ?By: David  Martinique M.D. ?On: 07/14/2016 11:40 ? ?No results found for this or any previous visit. ? ?No results found for this or any previous visit. ? ?No results found for this or any previous visit. ? ? ?Assessment & Plan:   ? ?1. Benign prostatic hyperplasia with urinary obstruction ?-Continue flomax 0.'4mg'$  daily ?- Urinalysis, Routine w reflex microscopic ? ?2. Weak urinary stream ?-continue flomax 0.'4mg'$  daily ? ?3. Prostate cancer ([place]) ?-continue surveillance. RTC 6 months with PSA ? ? ?No follow-ups on file. ? ?Nicolette Bang, MD ? ?[facility]  [place] ?  ?

## 2021-06-14 NOTE — Patient Instructions (Signed)

## 2021-06-18 DIAGNOSIS — N186 End stage renal disease: Secondary | ICD-10-CM | POA: Diagnosis not present

## 2021-06-18 DIAGNOSIS — Z992 Dependence on renal dialysis: Secondary | ICD-10-CM | POA: Diagnosis not present

## 2021-06-20 DIAGNOSIS — Z992 Dependence on renal dialysis: Secondary | ICD-10-CM | POA: Diagnosis not present

## 2021-06-20 DIAGNOSIS — N186 End stage renal disease: Secondary | ICD-10-CM | POA: Diagnosis not present

## 2021-06-21 DIAGNOSIS — Z992 Dependence on renal dialysis: Secondary | ICD-10-CM | POA: Diagnosis not present

## 2021-06-21 DIAGNOSIS — M545 Low back pain, unspecified: Secondary | ICD-10-CM | POA: Diagnosis not present

## 2021-06-21 DIAGNOSIS — I714 Abdominal aortic aneurysm, without rupture, unspecified: Secondary | ICD-10-CM | POA: Diagnosis not present

## 2021-06-21 DIAGNOSIS — I2510 Atherosclerotic heart disease of native coronary artery without angina pectoris: Secondary | ICD-10-CM | POA: Diagnosis not present

## 2021-06-21 DIAGNOSIS — E782 Mixed hyperlipidemia: Secondary | ICD-10-CM | POA: Diagnosis not present

## 2021-06-21 DIAGNOSIS — K219 Gastro-esophageal reflux disease without esophagitis: Secondary | ICD-10-CM | POA: Diagnosis not present

## 2021-06-21 DIAGNOSIS — N186 End stage renal disease: Secondary | ICD-10-CM | POA: Diagnosis not present

## 2021-06-21 DIAGNOSIS — E211 Secondary hyperparathyroidism, not elsewhere classified: Secondary | ICD-10-CM | POA: Diagnosis not present

## 2021-06-22 DIAGNOSIS — N186 End stage renal disease: Secondary | ICD-10-CM | POA: Diagnosis not present

## 2021-06-22 DIAGNOSIS — Z992 Dependence on renal dialysis: Secondary | ICD-10-CM | POA: Diagnosis not present

## 2021-06-25 DIAGNOSIS — N186 End stage renal disease: Secondary | ICD-10-CM | POA: Diagnosis not present

## 2021-06-25 DIAGNOSIS — Z992 Dependence on renal dialysis: Secondary | ICD-10-CM | POA: Diagnosis not present

## 2021-06-27 ENCOUNTER — Encounter: Payer: Self-pay | Admitting: Physician Assistant

## 2021-06-27 DIAGNOSIS — N186 End stage renal disease: Secondary | ICD-10-CM | POA: Diagnosis not present

## 2021-06-27 DIAGNOSIS — Z992 Dependence on renal dialysis: Secondary | ICD-10-CM | POA: Diagnosis not present

## 2021-06-29 DIAGNOSIS — N186 End stage renal disease: Secondary | ICD-10-CM | POA: Diagnosis not present

## 2021-06-29 DIAGNOSIS — Z992 Dependence on renal dialysis: Secondary | ICD-10-CM | POA: Diagnosis not present

## 2021-07-02 DIAGNOSIS — Z992 Dependence on renal dialysis: Secondary | ICD-10-CM | POA: Diagnosis not present

## 2021-07-02 DIAGNOSIS — N186 End stage renal disease: Secondary | ICD-10-CM | POA: Diagnosis not present

## 2021-07-04 DIAGNOSIS — N186 End stage renal disease: Secondary | ICD-10-CM | POA: Diagnosis not present

## 2021-07-04 DIAGNOSIS — Z992 Dependence on renal dialysis: Secondary | ICD-10-CM | POA: Diagnosis not present

## 2021-07-05 ENCOUNTER — Ambulatory Visit (INDEPENDENT_AMBULATORY_CARE_PROVIDER_SITE_OTHER): Payer: Medicare Other | Admitting: Physician Assistant

## 2021-07-05 ENCOUNTER — Encounter: Payer: Self-pay | Admitting: Physician Assistant

## 2021-07-05 DIAGNOSIS — R4189 Other symptoms and signs involving cognitive functions and awareness: Secondary | ICD-10-CM | POA: Insufficient documentation

## 2021-07-05 DIAGNOSIS — G3184 Mild cognitive impairment, so stated: Secondary | ICD-10-CM | POA: Insufficient documentation

## 2021-07-05 DIAGNOSIS — R0989 Other specified symptoms and signs involving the circulatory and respiratory systems: Secondary | ICD-10-CM | POA: Diagnosis not present

## 2021-07-05 DIAGNOSIS — R413 Other amnesia: Secondary | ICD-10-CM | POA: Diagnosis not present

## 2021-07-05 NOTE — Patient Instructions (Addendum)
It was a pleasure to see you today at our office.   Recommendations:  Neurocognitive evaluation at our office MRI of the brain, the radiology office will call you to arrange you appointment Check labs today  Try hard to decrease the tobacco  Carotid ultrasound  Follow up up with  eye doctor  Follow up in 1 month Mo, June 26 at 11:30   Whom to call:  Memory  decline, memory medications: Call out office [phone]   For psychiatric meds, mood meds: Please have your primary care physician manage these medications.    For assessment of decision of mental capacity and competency:  Call Dr. Anthoney Harada, geriatric psychiatrist at [phone]  For guidance in geriatric dementia issues please call [facility] [phone]  For guidance regarding [facility] and if placement were needed at the facility, contact Arnell Asal, Social Worker tel: [phone]  If you have any severe symptoms of a stroke, or other severe issues such as confusion,severe chills or fever, etc call 911 or go to the ER as you may need to be evaluate further       RECOMMENDATIONS FOR ALL PATIENTS WITH MEMORY PROBLEMS: 1. Continue to exercise (Recommend 30 minutes of walking everyday, or 3 hours every week) 2. Increase social interactions - continue going to [place] and enjoy social gatherings with friends and family 3. Eat healthy, avoid fried foods and eat more fruits and vegetables 4. Maintain adequate blood pressure, blood sugar, and blood cholesterol level. Reducing the risk of stroke and cardiovascular disease also helps promoting better memory. 5. Avoid stressful situations. Live a simple life and avoid aggravations. Organize your time and prepare for the next day in anticipation. 6. Sleep well, avoid any interruptions of sleep and avoid any distractions in the bedroom that may interfere with adequate sleep quality 7. Avoid sugar, avoid sweets as there is a strong link  between excessive sugar intake, diabetes, and cognitive impairment We discussed the Mediterranean diet, which has been shown to help patients reduce the risk of progressive memory disorders and reduces cardiovascular risk. This includes eating fish, eat fruits and green leafy vegetables, nuts like almonds and hazelnuts, walnuts, and also use olive oil. Avoid fast foods and fried foods as much as possible. Avoid sweets and sugar as sugar use has been linked to worsening of memory function.  There is always a concern of gradual progression of memory problems. If this is the case, then we may need to adjust level of care according to patient needs. Support, both to the patient and caregiver, should then be put into place.      You have been referred for a neuropsychological evaluation (i.e., evaluation of memory and thinking abilities). Please bring someone with you to this appointment if possible, as it is helpful for the doctor to hear from both you and another adult who knows you well. Please bring eyeglasses and hearing aids if you wear them.    The evaluation will take approximately 3 hours and has two parts:   The first part is a clinical interview with the neuropsychologist (Dr. Melvyn Novas or Dr. Nicole Kindred). During the interview, the neuropsychologist will speak with you and the individual you brought to the appointment.    The second part of the evaluation is testing with the doctor's technician Hinton Dyer or Maudie Mercury). During the testing, the technician will ask you to remember different types of material, solve problems, and answer some questionnaires. Your family member will not be present  for this portion of the evaluation.   Please note: We must reserve several hours of the neuropsychologist's time and the psychometrician's time for your evaluation appointment. As such, there is a No-Show fee of $100. If you are unable to attend any of your appointments, please contact our office as soon as possible to  reschedule.    FALL PRECAUTIONS: Be cautious when walking. Scan the area for obstacles that may increase the risk of trips and falls. When getting up in the mornings, sit up at the edge of the bed for a few minutes before getting out of bed. Consider elevating the bed at the head end to avoid drop of blood pressure when getting up. Walk always in a well-lit room (use night lights in the walls). Avoid area rugs or power cords from appliances in the middle of the walkways. Use a walker or a cane if necessary and consider physical therapy for balance exercise. Get your eyesight checked regularly.  FINANCIAL OVERSIGHT: Supervision, especially oversight when making financial decisions or transactions is also recommended.  HOME SAFETY: Consider the safety of the kitchen when operating appliances like stoves, microwave oven, and blender. Consider having supervision and share cooking responsibilities until no longer able to participate in those. Accidents with firearms and other hazards in the house should be identified and addressed as well.   ABILITY TO BE LEFT ALONE: If patient is unable to contact 911 operator, consider using LifeLine, or when the need is there, arrange for someone to stay with patients. Smoking is a fire hazard, consider supervision or cessation. Risk of wandering should be assessed by caregiver and if detected at any point, supervision and safe proof recommendations should be instituted.  MEDICATION SUPERVISION: Inability to self-administer medication needs to be constantly addressed. Implement a mechanism to ensure safe administration of the medications.   DRIVING: Regarding driving, in patients with progressive memory problems, driving will be impaired. We advise to have someone else do the driving if trouble finding directions or if minor accidents are reported. Independent driving assessment is available to determine safety of driving.   If you are interested in the driving  assessment, you can contact the following:  [employer] in [place]  [place] [phone] or [phone]    [place] refers to food and lifestyle choices that are based on the traditions of countries located on the [employer]. This way of eating has been shown to help prevent certain conditions and improve outcomes for people who have chronic diseases, like kidney disease and heart disease. What are tips for following this plan? Lifestyle  Cook and eat meals together with your family, when possible. Drink enough fluid to keep your urine clear or pale yellow. Be physically active every day. This includes: Aerobic exercise like running or swimming. Leisure activities like gardening, walking, or housework. Get 7-8 hours of sleep each night. If recommended by your health care provider, drink red wine in moderation. This means 1 glass a day for nonpregnant women and 2 glasses a day for men. A glass of wine equals 5 oz (150 mL). Reading food labels  Check the serving size of packaged foods. For foods such as rice and pasta, the serving size refers to the amount of cooked product, not dry. Check the total fat in packaged foods. Avoid foods that have saturated fat or trans fats. Check the ingredients list for added sugars, such  as corn syrup. Shopping  At the grocery store, buy most of your food from the areas near the walls of the store. This includes: Fresh fruits and vegetables (produce). Grains, beans, nuts, and seeds. Some of these may be available in unpackaged forms or large amounts (in bulk). Fresh seafood. Poultry and eggs. Low-fat dairy products. Buy whole ingredients instead of prepackaged foods. Buy fresh fruits and vegetables in-season from local farmers markets. Buy frozen fruits and vegetables in resealable bags. If  you do not have access to quality fresh seafood, buy precooked frozen shrimp or canned fish, such as tuna, salmon, or sardines. Buy small amounts of raw or cooked vegetables, salads, or olives from the deli or salad bar at your store. Stock your pantry so you always have certain foods on hand, such as olive oil, canned tuna, canned tomatoes, rice, pasta, and beans. Cooking  Cook foods with extra-virgin olive oil instead of using butter or other vegetable oils. Have meat as a side dish, and have vegetables or grains as your main dish. This means having meat in small portions or adding small amounts of meat to foods like pasta or stew. Use beans or vegetables instead of meat in common dishes like chili or lasagna. Experiment with different cooking methods. Try roasting or broiling vegetables instead of steaming or sauteing them. Add frozen vegetables to soups, stews, pasta, or rice. Add nuts or seeds for added healthy fat at each meal. You can add these to yogurt, salads, or vegetable dishes. Marinate fish or vegetables using olive oil, lemon juice, garlic, and fresh herbs. Meal planning  Plan to eat 1 vegetarian meal one day each week. Try to work up to 2 vegetarian meals, if possible. Eat seafood 2 or more times a week. Have healthy snacks readily available, such as: Vegetable sticks with hummus. Greek yogurt. Fruit and nut trail mix. Eat balanced meals throughout the week. This includes: Fruit: 2-3 servings a day Vegetables: 4-5 servings a day Low-fat dairy: 2 servings a day Fish, poultry, or lean meat: 1 serving a day Beans and legumes: 2 or more servings a week Nuts and seeds: 1-2 servings a day Whole grains: 6-8 servings a day Extra-virgin olive oil: 3-4 servings a day Limit red meat and sweets to only a few servings a month What are my food choices? Mediterranean diet Recommended Grains: Whole-grain pasta. Brown rice. Bulgar wheat. Polenta. Couscous. Whole-wheat bread. [place]. Vegetables: Artichokes. Beets. Broccoli. Cabbage. Carrots. Eggplant. Green beans. Chard. Kale. Spinach. Onions. Leeks. Peas. Squash. Tomatoes. Peppers. Radishes. Fruits: Apples. Apricots. Avocado. Berries. Bananas. Cherries. Dates. Figs. Grapes. Lemons. Melon. Oranges. Peaches. Plums. Pomegranate. Meats and other protein foods: Beans. Almonds. Sunflower seeds. Pine nuts. Peanuts. [place]. Salmon. Scallops. Shrimp. [place]. Tilapia. Clams. Oysters. Eggs. Dairy: Low-fat milk. Cheese. Greek yogurt. Beverages: Water. Red wine. Herbal tea. Fats and oils: Extra virgin olive oil. Avocado oil. Grape seed oil. Sweets and desserts: [place] yogurt with honey. Baked apples. Poached pears. Trail mix. Seasoning and other foods: Basil. Cilantro. Coriander. Cumin. Mint. Parsley. Sage. Rosemary. Tarragon. Garlic. Oregano. Thyme. Pepper. Balsalmic vinegar. Tahini. Hummus. Tomato sauce. Olives. Mushrooms. Limit these Grains: Prepackaged pasta or rice dishes. Prepackaged cereal with added sugar. Vegetables: Deep fried potatoes (french fries). Fruits: Fruit canned in syrup. Meats and other protein foods: Beef. Pork. Lamb. Poultry with skin. Hot dogs. Berniece Salines. Dairy: Ice cream. Sour cream. Whole milk. Beverages: Juice. Sugar-sweetened soft drinks. Beer. Liquor and spirits. Fats and oils: Butter. Canola oil. Vegetable oil. Beef fat (tallow).  Lard. Sweets and desserts: Cookies. Cakes. Pies. Candy. Seasoning and other foods: Mayonnaise. Premade sauces and marinades. The items listed may not be a complete list. Talk with your dietitian about what dietary choices are right for you. Summary The Mediterranean diet includes both food and lifestyle choices. Eat a variety of fresh fruits and vegetables, beans, nuts, seeds, and whole grains. Limit the amount of red meat and sweets that you eat. Talk with your health care provider about whether it is safe for you to drink red wine in moderation. This means 1 glass a day for  nonpregnant women and 2 glasses a day for men. A glass of wine equals 5 oz (150 mL). This information is not intended to replace advice given to you by your health care provider. Make sure you discuss any questions you have with your health care provider. Document Released: 09/20/2015 Document Revised: 10/23/2015 Document Reviewed: 09/20/2015 Elsevier Interactive Patient Education  2017 [place]    1 mo  We have sent a referral to [facility] for your MRI and cartoid they will call you directly to schedule your appointment. They are located at [address]. If you need to contact them directly please call [phone].  Your provider has requested that you have labwork completed today. Please go to [facility] ([id number]) on the second floor of this building before leaving the office today. You do not need to check in. If you are not called within 15 minutes please check with the front desk.

## 2021-07-05 NOTE — Progress Notes (Signed)
Assessment/Plan:     The patient is seen in neurologic consultation at the request of  Alcide Clever, PA-C for the evaluation of memory difficulties.  Ronald [name] is a very pleasant 81 y.o. year old RH male originally from [place], with a history of ESRD on hemodialysis since 07/2020, prostate cancer diagnosed in 2019 s/p hormone therapy and radiation (Jan-Mar 2020), CAD s/p MI sp CABG, s/p AAA repair, hypertension, hyperlipidemia, history of perioperative stroke 20 years ago, seen today for evaluation of memory loss. MoCA today is 21/30, with deficiencies in visuospatial executive, language, delayed recall 0/5.  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  He also reports that his memory issues began when he began dialysis, therefore other causes, including those reversible such as anemia, or metabolic, medicines, needs to be further investigated.     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 Referral to neuropsychology for neurocognitive testing, to further evaluate other causes of memory loss including depression, anxiety, insomnia, as well for evaluation of disease trajectory and clarity of the diagnosis Check carotid ultrasound, the patient was found to have left carotid bruit and he is a heavy smoker [place] up in 1 month  Subjective:     The patient is accompanied by his wife who supplements the history.   How long did patient have memory difficulties?  Wife reports that "has been going on for about 1 year, worse over the last 3 months". "He doesn't remember his daughter's name or even a neighbor he sees all the time ".  If he is on a phone conversation , in the middle of it he may stop and say"who is this  again?".  Social worker Health and safety inspector witnessed the patient contacting his wife during the treatment he was receiving instructions, because "If I do not call my wife right away, I will forget what the social worker told me ". He attributes this to age and to recent dialysis.  He does dialysis Tuesdays Thursdays and Saturdays, and he feels that those are the days in which his memory is worse.  He is a's "I take 2 pills that I do not know what they are but may make my memory worse ". Patient lives with: Spouse who noticed changes as well.  "He is a very bright man, and for the last year he has these memory problems " repeats oneself?   Disoriented when walking into a room?  Patient denies   Leaving objects in unusual places?  Patient denies   Ambulates  with difficulty?   Patient denies   Recent falls?  Patient denies   Any head injuries?  Remotely, he hit his head in the right parietal occipital area, without loss of consciousness or any other residuals. History of seizures?   Patient denies   Wandering behavior?  Patient denies   Patient drives?   He continues to drive, but there was an episode when driving home from dialysis, he became lost, and called his wife for directions.  Following right, he preferred to take public transportation Any mood changes?  Patient denies   Any history of depression?:  Patient denies   Hallucinations?  Patient denies   Paranoia?  Patient denies   Patient reports that  he sleeps well without vivid dreams, REM behavior or sleepwalking    History of sleep apnea?  Patient denies   Any hygiene concerns?  Patient denies   Independent of bathing and dressing?  Endorsed  Does the patient needs help with medications?  His wife prepares the pillbox with the medications.   Who is in charge of the finances?  Patient is in charge  Any changes in appetite?  Patient denies   Patient have trouble swallowing? Patient denies   Does the patient cook?  Patient denies   Any  kitchen accidents such as leaving the stove on? Patient denies   Any headaches?  Patient denies   Any double vision? Patient denies.  The patient has a history of cataracts Any focal numbness or tingling?  Patient denies   Chronic back pain Patient denies   Unilateral weakness?  Patient denies   Any tremors?  Patient denies   Any history of anosmia?  Patient denies   Any incontinence of urine?  He has weak urinary stream in the setting of ESRD requiring HD.  He is on Flomax 0.4 mg daily. Any bowel dysfunction?   Patient denies   History of heavy alcohol intake?  Patient denies   History of heavy tobacco use?  Patient has been smoking most of his life 1 to 2 packs a day of cigarettes.   Family history of dementia?  Patient denies  Any other concerns?  The patient complains of bilateral ringing in his ears, described as a constant sound bilaterally, but due to language barrier, he cannot describe any further details.  He reports that this is present for several years.  He denies taking aspirin.  He denies working in loud noise environments.  He feels that at times he hears his "neck noise ". He is married for 7 years, has 2 children, he is retired Geophysicist/field seismologist, he moved from [place] in 2017 to [place].   Allergies  Allergen Reactions   Tums [Calcium Carbonate] Other (See Comments)    Abdominal cramps    Current Outpatient Medications  Medication Instructions   atorvastatin (LIPITOR) 80 mg, Oral, Every morning   calcium carbonate (OSCAL) 600 mg, Oral, Every morning   carvedilol (COREG) 6.25 mg, Oral, 2 times daily with meals   lidocaine-prilocaine (EMLA) cream 1 application., Topical, As needed   magnesium oxide (MAG-OX) 400 mg, Oral, Every evening   MAGNESIUM-POTASSIUM PO 1 tablet, Oral, Every morning, 210 mg-99 mg   tamsulosin (FLOMAX) 0.4 mg, Oral, Daily after supper   traMADol (ULTRAM) 50 mg, Oral, 3 times daily PRN   zolpidem (AMBIEN) 10 mg, Oral, Daily at bedtime      VITALS:  There were no vitals filed for this visit.     View : No data to display.          PHYSICAL EXAM   HEENT:  Normocephalic, atraumatic. The mucous membranes are moist. The superficial temporal arteries are without ropiness or tenderness. Cardiovascular: Regular rate and rhythm without murmurs rubs or gallops Lungs: Clear to auscultation bilaterally. Neck: There are left carotid bruits noted, harsh.  No apparent carotid bruits on the right. NEUROLOGICAL:    07/05/2021    3:00 PM  Montreal Cognitive Assessment   Visuospatial/ Executive (0/5) 4  Naming (0/3) 3  Attention: Read list of digits (0/2) 2  Attention: Read list of letters (0/1) 1  Attention: Serial 7 subtraction starting at 100 (0/3) 3  Language: Repeat phrase (0/2) 2  Language :  Fluency (0/1) 0  Abstraction (0/2) 0  Delayed Recall (0/5) 0  Orientation (0/6) 6  Total 21  Adjusted Score (based on education) 21        View : No data to display.           Orientation:  Alert and oriented to person, place and time. No aphasia or dysarthria. Fund of knowledge is appropriate. Recent memory impaired and remote memory intact.  Attention and concentration are normal.  Able to name objects and repeat phrases. Delayed recall  0/5 Cranial nerves: There is good facial symmetry. Extraocular muscles are intact and visual fields are full to confrontational testing. Speech is fluent and clear. Soft palate rises symmetrically and there is no tongue deviation. Hearing is intact to conversational tone. Tone: Tone is good throughout. Sensation: Sensation is intact to light touch and pinprick throughout. Vibration is intact at the bilateral big toe.There is no extinction with double simultaneous stimulation. There is no sensory dermatomal level identified. Coordination: The patient has no difficulty with RAM's or FNF bilaterally. Normal finger to nose  Motor: Strength is 5/5 in the bilateral upper and lower extremities.  There is no pronator drift. There are no fasciculations noted. DTR's: Deep tendon reflexes are 2/4 at the bilateral biceps, triceps, brachioradialis, patella and achilles.  Plantar responses are downgoing bilaterally. Gait and Station: The patient is able to ambulate without difficulty.The patient is able to heel toe walk without any difficulty.The patient is able to ambulate in a tandem fashion. The patient is able to stand in the Romberg position.     Thank you for allowing [place] the opportunity to participate in the care of this nice patient. Please do not hesitate to contact us for any questions or concerns.   Total time spent on today's visit was 65 minutes dedicated to this patient today, preparing to see patient, examining the patient, ordering tests and/or medications and counseling the patient, documenting clinical information in the EHR or other health record, independently interpreting results and communicating results to the patient/family, discussing treatment and goals, answering patient's questions and coordinating care.  Cc:  Celene Squibb, MD  Sharene Butters 07/05/2021 3:33 PM

## 2021-07-06 DIAGNOSIS — N186 End stage renal disease: Secondary | ICD-10-CM | POA: Diagnosis not present

## 2021-07-06 DIAGNOSIS — Z992 Dependence on renal dialysis: Secondary | ICD-10-CM | POA: Diagnosis not present

## 2021-07-09 DIAGNOSIS — N186 End stage renal disease: Secondary | ICD-10-CM | POA: Diagnosis not present

## 2021-07-09 DIAGNOSIS — Z992 Dependence on renal dialysis: Secondary | ICD-10-CM | POA: Diagnosis not present

## 2021-07-10 DIAGNOSIS — Z992 Dependence on renal dialysis: Secondary | ICD-10-CM | POA: Diagnosis not present

## 2021-07-10 DIAGNOSIS — N186 End stage renal disease: Secondary | ICD-10-CM | POA: Diagnosis not present

## 2021-07-11 DIAGNOSIS — N186 End stage renal disease: Secondary | ICD-10-CM | POA: Diagnosis not present

## 2021-07-11 DIAGNOSIS — Z992 Dependence on renal dialysis: Secondary | ICD-10-CM | POA: Diagnosis not present

## 2021-07-13 DIAGNOSIS — N186 End stage renal disease: Secondary | ICD-10-CM | POA: Diagnosis not present

## 2021-07-13 DIAGNOSIS — Z992 Dependence on renal dialysis: Secondary | ICD-10-CM | POA: Diagnosis not present

## 2021-07-16 DIAGNOSIS — Z992 Dependence on renal dialysis: Secondary | ICD-10-CM | POA: Diagnosis not present

## 2021-07-16 DIAGNOSIS — N186 End stage renal disease: Secondary | ICD-10-CM | POA: Diagnosis not present

## 2021-07-18 DIAGNOSIS — N186 End stage renal disease: Secondary | ICD-10-CM | POA: Diagnosis not present

## 2021-07-18 DIAGNOSIS — Z992 Dependence on renal dialysis: Secondary | ICD-10-CM | POA: Diagnosis not present

## 2021-07-20 DIAGNOSIS — N186 End stage renal disease: Secondary | ICD-10-CM | POA: Diagnosis not present

## 2021-07-20 DIAGNOSIS — Z992 Dependence on renal dialysis: Secondary | ICD-10-CM | POA: Diagnosis not present

## 2021-07-23 ENCOUNTER — Ambulatory Visit (HOSPITAL_COMMUNITY)
Admission: RE | Admit: 2021-07-23 | Discharge: 2021-07-23 | Disposition: A | Discharge status: Home / Self Care | Payer: Medicare Other | Source: Ambulatory Visit | Attending: Physician Assistant | Admitting: Physician Assistant

## 2021-07-23 DIAGNOSIS — G319 Degenerative disease of nervous system, unspecified: Secondary | ICD-10-CM | POA: Diagnosis not present

## 2021-07-23 DIAGNOSIS — R413 Other amnesia: Secondary | ICD-10-CM | POA: Insufficient documentation

## 2021-07-23 DIAGNOSIS — I6523 Occlusion and stenosis of bilateral carotid arteries: Secondary | ICD-10-CM | POA: Diagnosis not present

## 2021-07-23 DIAGNOSIS — R0989 Other specified symptoms and signs involving the circulatory and respiratory systems: Secondary | ICD-10-CM | POA: Diagnosis not present

## 2021-07-25 DIAGNOSIS — Z992 Dependence on renal dialysis: Secondary | ICD-10-CM | POA: Diagnosis not present

## 2021-07-25 DIAGNOSIS — N186 End stage renal disease: Secondary | ICD-10-CM | POA: Diagnosis not present

## 2021-07-27 DIAGNOSIS — N186 End stage renal disease: Secondary | ICD-10-CM | POA: Diagnosis not present

## 2021-07-27 DIAGNOSIS — Z992 Dependence on renal dialysis: Secondary | ICD-10-CM | POA: Diagnosis not present

## 2021-07-30 DIAGNOSIS — N186 End stage renal disease: Secondary | ICD-10-CM | POA: Diagnosis not present

## 2021-07-30 DIAGNOSIS — Z992 Dependence on renal dialysis: Secondary | ICD-10-CM | POA: Diagnosis not present

## 2021-08-01 DIAGNOSIS — N186 End stage renal disease: Secondary | ICD-10-CM | POA: Diagnosis not present

## 2021-08-01 DIAGNOSIS — Z992 Dependence on renal dialysis: Secondary | ICD-10-CM | POA: Diagnosis not present

## 2021-08-03 DIAGNOSIS — N186 End stage renal disease: Secondary | ICD-10-CM | POA: Diagnosis not present

## 2021-08-03 DIAGNOSIS — Z992 Dependence on renal dialysis: Secondary | ICD-10-CM | POA: Diagnosis not present

## 2021-08-05 ENCOUNTER — Ambulatory Visit: Payer: Medicare Other | Admitting: Physician Assistant

## 2021-08-05 ENCOUNTER — Encounter: Payer: Self-pay | Admitting: Physician Assistant

## 2021-08-05 VITALS — BP 181/71 | HR 66 | Resp 18 | Ht 74.0 in | Wt 208.0 lb

## 2021-08-05 DIAGNOSIS — I6522 Occlusion and stenosis of left carotid artery: Secondary | ICD-10-CM | POA: Diagnosis not present

## 2021-08-05 DIAGNOSIS — G3184 Mild cognitive impairment, so stated: Secondary | ICD-10-CM | POA: Diagnosis not present

## 2021-08-05 MED ORDER — MEMANTINE HCL 5 MG PO TABS: 5.0000 mg | ORAL_TABLET | Freq: Two times a day (BID) | ORAL | 11 refills | Status: DC

## 2021-08-05 NOTE — Progress Notes (Addendum)
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  Ronald [name] is a very pleasant 81 y.o. year old RH male originally from [place], with a history of ESRD on hemodialysis since 07/2020, prostate cancer diagnosed in 2019 s/p hormone therapy and radiation (Jan-Mar 2020), CAD s/p MI sp CABG, s/p AAA repair, hypertension, hyperlipidemia, history of perioperative stroke 20 years ago, seen today for evaluation of memory loss. MoCA on 07/05/2021 was 21/30, with deficiencies in visuospatial executive, language, delayed recall 0/5.   He also reports that his memory issues began when he began dialysis, therefore other causes, including those reversible such as anemia, or metabolic, medicines, needs to be further investigated.   MRI of the brain 07/2021 was remarkable for small chronic cortically-based infarcts within the left occipital lobe (PCA vascular territory), mild-to-moderate chronic small vessel ischemic changes within the cerebral white matter and tiny chronic infarct within the left cerebellar hemisphere as well ad mild for age generalized cerebral and cerebellar atrophy. 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    Recommendations:    Continue donepezil 10 mg daily Side effects were discussed Discontinue tobacco abuse Resume Plavix for stroke prevention   Start Memantine 5 mg: Take 1 tablet (5 mg at night) for 2 weeks, then increase to 1 tablet (5 mg) twice a day   Neuropsych testing for clarity of diagnosis and disease trajectory   Referral to [facility] for L ICA stenosis Recommend good control of all cardiovascular risk factors  Follow up in 3 months.   Case discussed with Dr. Delice Lesch who agrees with the plan     Subjective:    Ronald [name] is a very  pleasant 81 y.o. RH male  seen today in follow up for memory loss. This patient is accompanied in the office by his wife who supplements the history.  Previous records as well as any outside records available were reviewed prior to todays visit.  Patient was last seen at our office on 07/05/2021 at which time his MoCA was 21/30    Any changes in memory since last visit?  He reports no changes since his last visit.  His short-term memory is worse than his long-term memory.  He has significant trouble with numbers and dates. Patient lives with: Spouse who noticed changes as well.   repeats oneself?  He denies Disoriented when walking into a room?  Patient denies   Leaving objects in unusual places?  Patient denies   Ambulates  with difficulty?   Patient denies   Recent falls?  Patient denies   Any head injuries?  Patient denies   History of seizures?   Patient denies   Wandering behavior?  Patient denies   Patient drives?   No difficulties with driving at present. Any mood changes such irritability agitation?  Patient denies   Any history of depression?:  Patient denies   Hallucinations?  Patient denies   Paranoia?  Patient denies   Patient reports that he sleeps well without vivid dreams, REM behavior or sleepwalking   History of sleep apnea?  Patient denies   Any hygiene concerns?  Patient denies   Independent of bathing and dressing?  Endorsed  Does the patient needs help with medications?  pillbox pill pack  Patient denies   Who is in charge  of the finances?  Patient is in charge   Any changes in appetite?  Patient denies   Patient have trouble swallowing? Patient denies   Does the patient cook?  Patient denies   Any kitchen accidents such as leaving the stove on? Patient denies   Any headaches?  Patient denies   The double vision? Patient denies, but "sometimes I have blurriness in my eyes". He has a history of cataracts as well and is due to see an ophthalmologist.   Any focal  numbness or tingling?  Patient denies   Chronic back pain Patient denies   Unilateral weakness?  Patient denies   Any tremors?  Patient denies   Any history of anosmia?  Patient denies   Any incontinence of urine?  Patient denies   Any bowel dysfunction?   Patient denies   Initial visit 07/05/21  How long did patient have memory difficulties?  Wife reports that "has been going on for about 1 year, worse over the last 3 months". "He doesn't remember his daughter's name or even a neighbor he sees all the time ".  If he is on a phone conversation , in the middle of it he may stop and say"who is this again?".  Social worker Health and safety inspector witnessed the patient contacting his wife during the treatment he was receiving instructions, because "If I do not call my wife right away, I will forget what the social worker told me ". He attributes this to age and to recent dialysis.  He does dialysis Tuesdays Thursdays and Saturdays, and he feels that those are the days in which his memory is worse.  He is a's "I take 2 pills that I do not know what they are but may make my memory worse ". Patient lives with: Spouse who noticed changes as well.  "He is a very bright man, and for the last year he has these memory problems " repeats oneself?   Disoriented when walking into a room?  Patient denies   Leaving objects in unusual places?  Patient denies   Ambulates  with difficulty?   Patient denies   Recent falls?  Patient denies   Any head injuries?  Remotely, he hit his head in the right parietal occipital area, without loss of consciousness or any other residuals. History of seizures?   Patient denies   Wandering behavior?  Patient denies   Patient drives?   He continues to drive, but there was an episode when driving home from dialysis, he became lost, and called his wife for directions.  Following right, he preferred to take public transportation Any mood changes?  Patient denies   Any history of depression?:   Patient denies   Hallucinations?  Patient denies   Paranoia?  Patient denies   Patient reports that he sleeps well without vivid dreams, REM behavior or sleepwalking    History of sleep apnea?  Patient denies   Any hygiene concerns?  Patient denies   Independent of bathing and dressing?  Endorsed  Does the patient needs help with medications?  His wife prepares the pillbox with the medications.   Who is in charge of the finances?  Patient is in charge  Any changes in appetite?  Patient denies   Patient have trouble swallowing? Patient denies   Does the patient cook?  Patient denies   Any kitchen accidents such as leaving the stove on? Patient denies   Any headaches?  Patient denies   Any double vision? Patient  denies.  The patient has a history of cataracts Any focal numbness or tingling?  Patient denies   Chronic back pain Patient denies   Unilateral weakness?  Patient denies   Any tremors?  Patient denies   Any history of anosmia?  Patient denies   Any incontinence of urine?  He has weak urinary stream in the setting of ESRD requiring HD.  He is on Flomax 0.4 mg daily. Any bowel dysfunction?   Patient denies   History of heavy alcohol intake?  Patient denies   History of heavy tobacco use?  Patient has been smoking most of his life 1 to 2 packs a day of cigarettes.   Family history of dementia?  Patient denies  Any other concerns?  The patient complains of bilateral ringing in his ears, described as a constant sound bilaterally, but due to language barrier, he cannot describe any further details.  He reports that this is present for several years.  He denies taking aspirin.  He denies working in loud noise environments.  He feels that at times he hears his "neck noise ". He is married for 36 years, has 2 children, he is retired Geophysicist/field seismologist, he moved from [place] in 2017 to [place].  MRI of the brain 07/2021 No evidence of acute intracranial abnormality. 2. Small chronic  cortically-based infarcts within the left occipital lobe (PCA vascular territory).3. Mild-to-moderate chronic small vessel ischemic changes within the cerebral white matter.4. Tiny chronic infarct within the left cerebellar hemisphere.5. Mild for age generalized cerebral and cerebellar atrophy.   Carotid Ultrasound 07/2021 R: no significant stenosis, L 50-69 @ stenosis     Past Medical History:  Diagnosis Date   GERD (gastroesophageal reflux disease)    Hypertension    Myocardial infarction [facility])    1999   Prostate cancer ([place]) 02/2018   Renal insufficiency    Stroke ([place])    no deficits; had stroke while trying to place "stents in legs".     Past Surgical History:  Procedure Laterality Date   ABDOMINAL AORTIC ANEURYSM REPAIR  2012   IN [place]   APPENDECTOMY     AV FISTULA PLACEMENT Left 10/16/2020   Procedure: LEFT ARM ARTERIOVENOUS (AV) FISTULA CREATION;  Surgeon: Rosetta Posner, MD;  Location: [facility];  Service: Vascular;  Laterality: Left;   CORONARY ARTERY BYPASS GRAFT  01/1998   3 vessels 18 years ago   [place] Right 09/20/2015   Procedure: REPAIR RIGHT INGUINAL HERNIA  WITH MESH;  Surgeon: Vickie Epley, MD;  Location: [facility];  Service: General;  Laterality: Right;   INSERTION OF DIALYSIS CATHETER Right 08/02/2020   Procedure: INSERTION OF RIGHT TUNNELED INTERNAL JUGULAR  DIALYSIS CATHETER;  Surgeon: Rosetta Posner, MD;  Location: [facility];  Service: Vascular;  Laterality: Right;   REMOVAL OF A DIALYSIS CATHETER N/A 03/19/2021   Procedure: MINOR REMOVAL OF A TUNNELED DIALYSIS CATHETER;  Surgeon: Rosetta Posner, MD;  Location: [facility];  Service: Vascular;  Laterality: N/A;   tumor removal from bladder       PREVIOUS MEDICATIONS:   CURRENT MEDICATIONS:  Outpatient Encounter Medications as of 08/05/2021  Medication Sig   atorvastatin (LIPITOR) 80 MG tablet Take 80 mg by mouth in the morning.   calcium carbonate (OSCAL) 1500 (600 Ca) MG TABS tablet Take 600 mg by  mouth in the morning.   carvedilol (COREG) 12.5 MG tablet Take 6.25 mg by mouth 2 (two) times daily with a meal.  lidocaine-prilocaine (EMLA) cream Apply 1 application topically as needed (prior to dialysis).   magnesium oxide (MAG-OX) 400 MG tablet Take 400 mg by mouth every evening.   MAGNESIUM-POTASSIUM PO Take 1 tablet by mouth in the morning. 210 mg-99 mg   memantine (NAMENDA) 5 MG tablet Take 1 tablet (5 mg total) by mouth 2 (two) times daily.   tamsulosin (FLOMAX) 0.4 MG CAPS capsule Take 1 capsule (0.4 mg total) by mouth daily after supper.   traMADol (ULTRAM) 50 MG tablet Take 50 mg by mouth 3 (three) times daily as needed (pain.).   zolpidem (AMBIEN) 10 MG tablet Take 10 mg by mouth at bedtime.   No facility-administered encounter medications on file as of 08/05/2021.     Objective:     PHYSICAL EXAMINATION:    VITALS:   Vitals:   08/05/21 1143  BP: (!) 181/71  Pulse: 66  Resp: 18  SpO2: 97%  Weight: 208 lb (94.3 kg)  Height: '6\' 2"'$  (1.88 m)    GEN:  The patient appears stated age and is in NAD. HEENT:  Normocephalic, atraumatic.   Neurological examination:  General: NAD, well-groomed, appears stated age. Orientation: The patient is alert. Oriented to person, place and date Cranial nerves: There is good facial symmetry.The speech is fluent and clear. No aphasia or dysarthria. Fund of knowledge is appropriate. Recent memory impaired and remote memory is normal.  Attention and concentration are normal.  Able to name objects and repeat phrases.  Hearing is intact to conversational tone.    Sensation: Sensation is intact to light touch throughout Motor: Strength is at least antigravity x4. Tremors: none  DTR's 2/4 in UE/LE      07/05/2021    3:00 PM  Montreal Cognitive Assessment   Visuospatial/ Executive (0/5) 4  Naming (0/3) 3  Attention: Read list of digits (0/2) 2  Attention: Read list of letters (0/1) 1  Attention: Serial 7 subtraction starting at 100 (0/3)  3  Language: Repeat phrase (0/2) 2  Language : Fluency (0/1) 0  Abstraction (0/2) 0  Delayed Recall (0/5) 0  Orientation (0/6) 6  Total 21  Adjusted Score (based on education) 21        No data to display             Movement examination: Tone: There is normal tone in the UE/LE Abnormal movements:  no tremor.  No myoclonus.  No asterixis.   Coordination:  There is no decremation with RAM's. Normal finger to nose  Gait and Station: The patient has no difficulty arising out of a deep-seated chair without the use of the hands. The patient's stride length is good.  Gait is cautious and narrow.   Thank you for allowing [place] the opportunity to participate in the care of this nice patient. Please do not hesitate to contact us for any questions or concerns.   Total time spent on today's visit was 42 minutes dedicated to this patient today, preparing to see patient, examining the patient, ordering tests and/or medications and counseling the patient, documenting clinical information in the EHR or other health record, independently interpreting results and communicating results to the patient/family, discussing treatment and goals, answering patient's questions and coordinating care.  Cc:  Celene Squibb, MD  Sharene Butters 08/08/2021 7:05 AM   Cc:  Celene Squibb, MD Sharene Butters, PA-C

## 2021-08-08 DIAGNOSIS — N186 End stage renal disease: Secondary | ICD-10-CM | POA: Diagnosis not present

## 2021-08-08 DIAGNOSIS — Z992 Dependence on renal dialysis: Secondary | ICD-10-CM | POA: Diagnosis not present

## 2021-08-08 MED ORDER — MEMANTINE HCL 5 MG PO TABS: ORAL_TABLET | ORAL | 11 refills | Status: DC

## 2021-08-08 NOTE — Addendum Note (Signed)
Addended by: Sharene Butters E on: 08/08/2021 07:06 AM   Modules accepted: Orders

## 2021-08-09 DIAGNOSIS — Z992 Dependence on renal dialysis: Secondary | ICD-10-CM | POA: Diagnosis not present

## 2021-08-09 DIAGNOSIS — N186 End stage renal disease: Secondary | ICD-10-CM | POA: Diagnosis not present

## 2021-08-10 DIAGNOSIS — N186 End stage renal disease: Secondary | ICD-10-CM | POA: Diagnosis not present

## 2021-08-10 DIAGNOSIS — Z992 Dependence on renal dialysis: Secondary | ICD-10-CM | POA: Diagnosis not present

## 2021-08-13 DIAGNOSIS — Z992 Dependence on renal dialysis: Secondary | ICD-10-CM | POA: Diagnosis not present

## 2021-08-13 DIAGNOSIS — N186 End stage renal disease: Secondary | ICD-10-CM | POA: Diagnosis not present

## 2021-08-14 ENCOUNTER — Ambulatory Visit: Payer: Medicare Other | Admitting: Vascular Surgery

## 2021-08-14 ENCOUNTER — Encounter: Payer: Self-pay | Admitting: Vascular Surgery

## 2021-08-14 VITALS — BP 204/83 | HR 67 | Temp 97.0°F | Ht 74.0 in | Wt 211.0 lb

## 2021-08-14 DIAGNOSIS — Z992 Dependence on renal dialysis: Secondary | ICD-10-CM | POA: Diagnosis not present

## 2021-08-14 DIAGNOSIS — I6522 Occlusion and stenosis of left carotid artery: Secondary | ICD-10-CM | POA: Diagnosis not present

## 2021-08-14 DIAGNOSIS — N186 End stage renal disease: Secondary | ICD-10-CM

## 2021-08-14 NOTE — Progress Notes (Signed)
[facility] [place]  Patient name: Ronald [name] MRN: [id number] DOB: Oct 25, 1940 Sex: male  REASON FOR VISIT: Evaluation left carotid stenosis  HPI: Ronald [name] is a 81 y.o. male here today for evaluation of left carotid stenosis discovered on duplex.  He was undergoing work-up for memory loss.  MRI showed old silent CVA.  Carotid duplex revealed moderate left carotid stenosis and no right carotid stenosis.  He is right-handed.  He is known to me from history of left radiocephalic fistula creation and he is on hemodialysis via this.  Past Medical History:  Diagnosis Date   GERD (gastroesophageal reflux disease)    Hypertension    Myocardial infarction [facility])    1999   Prostate cancer ([place]) 02/2018   Renal insufficiency    Stroke [facility])    no deficits; had stroke while trying to place "stents in legs".    Family History  Problem Relation Age of Onset   Cancer Father     SOCIAL HISTORY: Social History   Tobacco Use   Smoking status: Former    Packs/day: 2.00    Years: 50.00    Total pack years: 100.00    Types: Cigarettes    Start date: 08/07/1957   Smokeless tobacco: Never   Tobacco comments:    on chantix now  Substance Use Topics   Alcohol use: No    No Active Allergies  Current Outpatient Medications  Medication Sig Dispense Refill   atorvastatin (LIPITOR) 80 MG tablet Take 80 mg by mouth in the morning.     calcium carbonate (OSCAL) 1500 (600 Ca) MG TABS tablet Take 600 mg by mouth in the morning.     carvedilol (COREG) 12.5 MG tablet Take 6.25 mg by mouth 2 (two) times daily with a meal.     lidocaine-prilocaine (EMLA) cream Apply 1 application topically as needed (prior to dialysis).     magnesium oxide (MAG-OX) 400 MG tablet Take 400 mg by mouth every evening.     MAGNESIUM-POTASSIUM PO Take 1 tablet by mouth in the morning. 210 mg-99 mg     memantine (NAMENDA) 5 MG tablet Take 1  tablet (5 mg at night) for 2 weeks, then increase to 1 tablet (5 mg) twice a day 60 tablet 11   tamsulosin (FLOMAX) 0.4 MG CAPS capsule Take 1 capsule (0.4 mg total) by mouth daily after supper. 90 capsule 3   traMADol (ULTRAM) 50 MG tablet Take 50 mg by mouth 3 (three) times daily as needed (pain.).     zolpidem (AMBIEN) 10 MG tablet Take 10 mg by mouth at bedtime.     No current facility-administered medications for this visit.    REVIEW OF SYSTEMS:  '[X]'$  denotes positive finding, '[ ]'$  denotes negative finding Cardiac  Comments:  Chest pain or chest pressure:    Shortness of breath upon exertion:    Short of breath when lying flat:    Irregular heart rhythm:        Vascular    Pain in calf, thigh, or hip brought on by ambulation:    Pain in feet at night that wakes you up from your sleep:     Blood clot in your veins:    Leg swelling:           PHYSICAL EXAM: Vitals:   08/14/21 1035 08/14/21 1036  BP: (!) 193/86 (!) 204/83  Pulse: 67   Temp: (!) 97 F (36.1 C)   TempSrc:  Temporal   SpO2: 95%   Weight: 211 lb (95.7 kg)   Height: '6\' 2"'$  (1.88 m)     GENERAL: The patient is a well-nourished male, in no acute distress. The vital signs are documented above. CARDIOVASCULAR: I do not appreciate carotid bruits today. He has a excellent thrill and good size maturation and his left arm radiocephalic AV fistula PULMONARY: There is good air exchange  MUSCULOSKELETAL: There are no major deformities or cyanosis. NEUROLOGIC: No focal weakness or paresthesias are detected. SKIN: There are no ulcers or rashes noted. PSYCHIATRIC: The patient has a normal affect.  DATA:  Carotid duplex from 07/23/2021 at [facility] revealed no evidence of stenosis in his right internal carotid artery.  He has a 50 to 69% stenosis in his left internal carotid artery.  MEDICAL ISSUES: I discussed these findings with the patient.  Explained that this would not have any bearing on his cognitive  changes.  He reports that this is very concerning to him.  He will continue his work-up through neurology.  We will see him again in 1 year with repeat carotid duplex for follow-up of his moderate left carotid stenosis  He does have a functional left radiocephalic fistula.  He reports that he had difficulty accessing this on 1 occasion within the last several weeks but otherwise has done fine with this.  He will notify us if he is having more difficulty.  I did explain that he could undergo fistulogram if there is any concern regarding the fistula.  He does have excellent maturation of his left upper arm cephalic vein if he needs conversion.    Rosetta Posner, MD FACS [facility] [phone]  Note: Portions of this report may have been transcribed using voice recognition software.  Every effort has been made to ensure accuracy; however, inadvertent computerized transcription errors may still be present.

## 2021-08-15 DIAGNOSIS — Z992 Dependence on renal dialysis: Secondary | ICD-10-CM | POA: Diagnosis not present

## 2021-08-15 DIAGNOSIS — N186 End stage renal disease: Secondary | ICD-10-CM | POA: Diagnosis not present

## 2021-08-17 DIAGNOSIS — Z992 Dependence on renal dialysis: Secondary | ICD-10-CM | POA: Diagnosis not present

## 2021-08-17 DIAGNOSIS — N186 End stage renal disease: Secondary | ICD-10-CM | POA: Diagnosis not present

## 2021-08-20 DIAGNOSIS — Z992 Dependence on renal dialysis: Secondary | ICD-10-CM | POA: Diagnosis not present

## 2021-08-20 DIAGNOSIS — N186 End stage renal disease: Secondary | ICD-10-CM | POA: Diagnosis not present

## 2021-08-24 DIAGNOSIS — N186 End stage renal disease: Secondary | ICD-10-CM | POA: Diagnosis not present

## 2021-08-24 DIAGNOSIS — Z992 Dependence on renal dialysis: Secondary | ICD-10-CM | POA: Diagnosis not present

## 2021-08-27 DIAGNOSIS — N186 End stage renal disease: Secondary | ICD-10-CM | POA: Diagnosis not present

## 2021-08-27 DIAGNOSIS — Z992 Dependence on renal dialysis: Secondary | ICD-10-CM | POA: Diagnosis not present

## 2021-08-29 DIAGNOSIS — Z992 Dependence on renal dialysis: Secondary | ICD-10-CM | POA: Diagnosis not present

## 2021-08-29 DIAGNOSIS — N186 End stage renal disease: Secondary | ICD-10-CM | POA: Diagnosis not present

## 2021-09-03 DIAGNOSIS — Z992 Dependence on renal dialysis: Secondary | ICD-10-CM | POA: Diagnosis not present

## 2021-09-03 DIAGNOSIS — N186 End stage renal disease: Secondary | ICD-10-CM | POA: Diagnosis not present

## 2021-09-05 DIAGNOSIS — N186 End stage renal disease: Secondary | ICD-10-CM | POA: Diagnosis not present

## 2021-09-05 DIAGNOSIS — Z992 Dependence on renal dialysis: Secondary | ICD-10-CM | POA: Diagnosis not present

## 2021-09-09 DIAGNOSIS — Z992 Dependence on renal dialysis: Secondary | ICD-10-CM | POA: Diagnosis not present

## 2021-09-09 DIAGNOSIS — N186 End stage renal disease: Secondary | ICD-10-CM | POA: Diagnosis not present

## 2021-09-11 DIAGNOSIS — N186 End stage renal disease: Secondary | ICD-10-CM | POA: Diagnosis not present

## 2021-09-11 DIAGNOSIS — Z992 Dependence on renal dialysis: Secondary | ICD-10-CM | POA: Diagnosis not present

## 2021-09-13 DIAGNOSIS — Z992 Dependence on renal dialysis: Secondary | ICD-10-CM | POA: Diagnosis not present

## 2021-09-13 DIAGNOSIS — N186 End stage renal disease: Secondary | ICD-10-CM | POA: Diagnosis not present

## 2021-09-16 DIAGNOSIS — Z992 Dependence on renal dialysis: Secondary | ICD-10-CM | POA: Diagnosis not present

## 2021-09-16 DIAGNOSIS — N186 End stage renal disease: Secondary | ICD-10-CM | POA: Diagnosis not present

## 2021-09-18 DIAGNOSIS — Z992 Dependence on renal dialysis: Secondary | ICD-10-CM | POA: Diagnosis not present

## 2021-09-18 DIAGNOSIS — N186 End stage renal disease: Secondary | ICD-10-CM | POA: Diagnosis not present

## 2021-09-19 DIAGNOSIS — R197 Diarrhea, unspecified: Secondary | ICD-10-CM | POA: Diagnosis not present

## 2021-09-20 DIAGNOSIS — Z992 Dependence on renal dialysis: Secondary | ICD-10-CM | POA: Diagnosis not present

## 2021-09-20 DIAGNOSIS — N186 End stage renal disease: Secondary | ICD-10-CM | POA: Diagnosis not present

## 2021-09-23 DIAGNOSIS — N186 End stage renal disease: Secondary | ICD-10-CM | POA: Diagnosis not present

## 2021-09-23 DIAGNOSIS — Z992 Dependence on renal dialysis: Secondary | ICD-10-CM | POA: Diagnosis not present

## 2021-09-25 DIAGNOSIS — N186 End stage renal disease: Secondary | ICD-10-CM | POA: Diagnosis not present

## 2021-09-25 DIAGNOSIS — Z992 Dependence on renal dialysis: Secondary | ICD-10-CM | POA: Diagnosis not present

## 2021-09-30 DIAGNOSIS — N186 End stage renal disease: Secondary | ICD-10-CM | POA: Diagnosis not present

## 2021-09-30 DIAGNOSIS — Z992 Dependence on renal dialysis: Secondary | ICD-10-CM | POA: Diagnosis not present

## 2021-10-02 DIAGNOSIS — Z992 Dependence on renal dialysis: Secondary | ICD-10-CM | POA: Diagnosis not present

## 2021-10-02 DIAGNOSIS — N186 End stage renal disease: Secondary | ICD-10-CM | POA: Diagnosis not present

## 2021-10-09 DIAGNOSIS — N186 End stage renal disease: Secondary | ICD-10-CM | POA: Diagnosis not present

## 2021-10-09 DIAGNOSIS — Z992 Dependence on renal dialysis: Secondary | ICD-10-CM | POA: Diagnosis not present

## 2021-10-10 DIAGNOSIS — N186 End stage renal disease: Secondary | ICD-10-CM | POA: Diagnosis not present

## 2021-10-10 DIAGNOSIS — Z992 Dependence on renal dialysis: Secondary | ICD-10-CM | POA: Diagnosis not present

## 2021-10-10 DIAGNOSIS — I10 Essential (primary) hypertension: Secondary | ICD-10-CM | POA: Diagnosis not present

## 2021-10-10 DIAGNOSIS — E782 Mixed hyperlipidemia: Secondary | ICD-10-CM | POA: Diagnosis not present

## 2021-10-11 DIAGNOSIS — Z992 Dependence on renal dialysis: Secondary | ICD-10-CM | POA: Diagnosis not present

## 2021-10-11 DIAGNOSIS — N186 End stage renal disease: Secondary | ICD-10-CM | POA: Diagnosis not present

## 2021-10-14 DIAGNOSIS — N186 End stage renal disease: Secondary | ICD-10-CM | POA: Diagnosis not present

## 2021-10-14 DIAGNOSIS — Z992 Dependence on renal dialysis: Secondary | ICD-10-CM | POA: Diagnosis not present

## 2021-10-16 DIAGNOSIS — N186 End stage renal disease: Secondary | ICD-10-CM | POA: Diagnosis not present

## 2021-10-16 DIAGNOSIS — Z992 Dependence on renal dialysis: Secondary | ICD-10-CM | POA: Diagnosis not present

## 2021-10-18 DIAGNOSIS — N186 End stage renal disease: Secondary | ICD-10-CM | POA: Diagnosis not present

## 2021-10-18 DIAGNOSIS — Z992 Dependence on renal dialysis: Secondary | ICD-10-CM | POA: Diagnosis not present

## 2021-10-21 DIAGNOSIS — Z992 Dependence on renal dialysis: Secondary | ICD-10-CM | POA: Diagnosis not present

## 2021-10-21 DIAGNOSIS — N186 End stage renal disease: Secondary | ICD-10-CM | POA: Diagnosis not present

## 2021-10-23 DIAGNOSIS — N186 End stage renal disease: Secondary | ICD-10-CM | POA: Diagnosis not present

## 2021-10-23 DIAGNOSIS — Z992 Dependence on renal dialysis: Secondary | ICD-10-CM | POA: Diagnosis not present

## 2021-10-25 DIAGNOSIS — N186 End stage renal disease: Secondary | ICD-10-CM | POA: Diagnosis not present

## 2021-10-25 DIAGNOSIS — Z992 Dependence on renal dialysis: Secondary | ICD-10-CM | POA: Diagnosis not present

## 2021-10-28 DIAGNOSIS — Z992 Dependence on renal dialysis: Secondary | ICD-10-CM | POA: Diagnosis not present

## 2021-10-28 DIAGNOSIS — N186 End stage renal disease: Secondary | ICD-10-CM | POA: Diagnosis not present

## 2021-10-30 DIAGNOSIS — N186 End stage renal disease: Secondary | ICD-10-CM | POA: Diagnosis not present

## 2021-10-30 DIAGNOSIS — Z992 Dependence on renal dialysis: Secondary | ICD-10-CM | POA: Diagnosis not present

## 2021-11-01 DIAGNOSIS — Z992 Dependence on renal dialysis: Secondary | ICD-10-CM | POA: Diagnosis not present

## 2021-11-01 DIAGNOSIS — N186 End stage renal disease: Secondary | ICD-10-CM | POA: Diagnosis not present

## 2021-11-04 DIAGNOSIS — N186 End stage renal disease: Secondary | ICD-10-CM | POA: Diagnosis not present

## 2021-11-04 DIAGNOSIS — Z992 Dependence on renal dialysis: Secondary | ICD-10-CM | POA: Diagnosis not present

## 2021-11-06 ENCOUNTER — Ambulatory Visit: Payer: Medicare Other | Admitting: Physician Assistant

## 2021-11-06 DIAGNOSIS — Z992 Dependence on renal dialysis: Secondary | ICD-10-CM | POA: Diagnosis not present

## 2021-11-06 DIAGNOSIS — N186 End stage renal disease: Secondary | ICD-10-CM | POA: Diagnosis not present

## 2021-11-08 DIAGNOSIS — N186 End stage renal disease: Secondary | ICD-10-CM | POA: Diagnosis not present

## 2021-11-08 DIAGNOSIS — Z992 Dependence on renal dialysis: Secondary | ICD-10-CM | POA: Diagnosis not present

## 2021-11-09 DIAGNOSIS — N186 End stage renal disease: Secondary | ICD-10-CM | POA: Diagnosis not present

## 2021-11-09 DIAGNOSIS — Z992 Dependence on renal dialysis: Secondary | ICD-10-CM | POA: Diagnosis not present

## 2021-11-11 DIAGNOSIS — Z992 Dependence on renal dialysis: Secondary | ICD-10-CM | POA: Diagnosis not present

## 2021-11-11 DIAGNOSIS — N186 End stage renal disease: Secondary | ICD-10-CM | POA: Diagnosis not present

## 2021-11-15 DIAGNOSIS — Z992 Dependence on renal dialysis: Secondary | ICD-10-CM | POA: Diagnosis not present

## 2021-11-15 DIAGNOSIS — N186 End stage renal disease: Secondary | ICD-10-CM | POA: Diagnosis not present

## 2021-11-18 DIAGNOSIS — N186 End stage renal disease: Secondary | ICD-10-CM | POA: Diagnosis not present

## 2021-11-18 DIAGNOSIS — Z992 Dependence on renal dialysis: Secondary | ICD-10-CM | POA: Diagnosis not present

## 2021-11-22 DIAGNOSIS — N186 End stage renal disease: Secondary | ICD-10-CM | POA: Diagnosis not present

## 2021-11-22 DIAGNOSIS — Z992 Dependence on renal dialysis: Secondary | ICD-10-CM | POA: Diagnosis not present

## 2021-11-25 DIAGNOSIS — N186 End stage renal disease: Secondary | ICD-10-CM | POA: Diagnosis not present

## 2021-11-25 DIAGNOSIS — Z992 Dependence on renal dialysis: Secondary | ICD-10-CM | POA: Diagnosis not present

## 2021-11-27 DIAGNOSIS — N186 End stage renal disease: Secondary | ICD-10-CM | POA: Diagnosis not present

## 2021-11-27 DIAGNOSIS — Z992 Dependence on renal dialysis: Secondary | ICD-10-CM | POA: Diagnosis not present

## 2021-11-29 DIAGNOSIS — Z992 Dependence on renal dialysis: Secondary | ICD-10-CM | POA: Diagnosis not present

## 2021-11-29 DIAGNOSIS — N186 End stage renal disease: Secondary | ICD-10-CM | POA: Diagnosis not present

## 2021-12-02 DIAGNOSIS — Z992 Dependence on renal dialysis: Secondary | ICD-10-CM | POA: Diagnosis not present

## 2021-12-02 DIAGNOSIS — N186 End stage renal disease: Secondary | ICD-10-CM | POA: Diagnosis not present

## 2021-12-06 DIAGNOSIS — Z992 Dependence on renal dialysis: Secondary | ICD-10-CM | POA: Diagnosis not present

## 2021-12-06 DIAGNOSIS — N186 End stage renal disease: Secondary | ICD-10-CM | POA: Diagnosis not present

## 2021-12-09 ENCOUNTER — Other Ambulatory Visit: Payer: Medicare Other

## 2021-12-09 DIAGNOSIS — N186 End stage renal disease: Secondary | ICD-10-CM | POA: Diagnosis not present

## 2021-12-09 DIAGNOSIS — Z992 Dependence on renal dialysis: Secondary | ICD-10-CM | POA: Diagnosis not present

## 2021-12-10 DIAGNOSIS — N186 End stage renal disease: Secondary | ICD-10-CM | POA: Diagnosis not present

## 2021-12-10 DIAGNOSIS — Z992 Dependence on renal dialysis: Secondary | ICD-10-CM | POA: Diagnosis not present

## 2021-12-11 DIAGNOSIS — Z992 Dependence on renal dialysis: Secondary | ICD-10-CM | POA: Diagnosis not present

## 2021-12-11 DIAGNOSIS — N186 End stage renal disease: Secondary | ICD-10-CM | POA: Diagnosis not present

## 2021-12-13 DIAGNOSIS — N186 End stage renal disease: Secondary | ICD-10-CM | POA: Diagnosis not present

## 2021-12-13 DIAGNOSIS — Z992 Dependence on renal dialysis: Secondary | ICD-10-CM | POA: Diagnosis not present

## 2021-12-16 DIAGNOSIS — N186 End stage renal disease: Secondary | ICD-10-CM | POA: Diagnosis not present

## 2021-12-16 DIAGNOSIS — Z992 Dependence on renal dialysis: Secondary | ICD-10-CM | POA: Diagnosis not present

## 2021-12-19 ENCOUNTER — Other Ambulatory Visit: Payer: Self-pay | Admitting: Urology

## 2021-12-19 DIAGNOSIS — Z992 Dependence on renal dialysis: Secondary | ICD-10-CM | POA: Diagnosis not present

## 2021-12-19 DIAGNOSIS — N186 End stage renal disease: Secondary | ICD-10-CM | POA: Diagnosis not present

## 2021-12-19 DIAGNOSIS — E782 Mixed hyperlipidemia: Secondary | ICD-10-CM | POA: Diagnosis not present

## 2021-12-19 DIAGNOSIS — C61 Malignant neoplasm of prostate: Secondary | ICD-10-CM

## 2021-12-20 ENCOUNTER — Ambulatory Visit: Payer: Medicare Other | Admitting: Urology

## 2021-12-20 DIAGNOSIS — N186 End stage renal disease: Secondary | ICD-10-CM | POA: Diagnosis not present

## 2021-12-20 DIAGNOSIS — Z992 Dependence on renal dialysis: Secondary | ICD-10-CM | POA: Diagnosis not present

## 2021-12-23 DIAGNOSIS — N186 End stage renal disease: Secondary | ICD-10-CM | POA: Diagnosis not present

## 2021-12-23 DIAGNOSIS — Z992 Dependence on renal dialysis: Secondary | ICD-10-CM | POA: Diagnosis not present

## 2021-12-24 DIAGNOSIS — Z992 Dependence on renal dialysis: Secondary | ICD-10-CM | POA: Diagnosis not present

## 2021-12-24 DIAGNOSIS — E782 Mixed hyperlipidemia: Secondary | ICD-10-CM | POA: Diagnosis not present

## 2021-12-24 DIAGNOSIS — M545 Low back pain, unspecified: Secondary | ICD-10-CM | POA: Diagnosis not present

## 2021-12-24 DIAGNOSIS — E211 Secondary hyperparathyroidism, not elsewhere classified: Secondary | ICD-10-CM | POA: Diagnosis not present

## 2021-12-24 DIAGNOSIS — I714 Abdominal aortic aneurysm, without rupture, unspecified: Secondary | ICD-10-CM | POA: Diagnosis not present

## 2021-12-24 DIAGNOSIS — K219 Gastro-esophageal reflux disease without esophagitis: Secondary | ICD-10-CM | POA: Diagnosis not present

## 2021-12-24 DIAGNOSIS — D649 Anemia, unspecified: Secondary | ICD-10-CM | POA: Diagnosis not present

## 2021-12-24 DIAGNOSIS — I2510 Atherosclerotic heart disease of native coronary artery without angina pectoris: Secondary | ICD-10-CM | POA: Diagnosis not present

## 2021-12-24 DIAGNOSIS — N186 End stage renal disease: Secondary | ICD-10-CM | POA: Diagnosis not present

## 2021-12-25 DIAGNOSIS — N186 End stage renal disease: Secondary | ICD-10-CM | POA: Diagnosis not present

## 2021-12-25 DIAGNOSIS — Z992 Dependence on renal dialysis: Secondary | ICD-10-CM | POA: Diagnosis not present

## 2021-12-27 ENCOUNTER — Ambulatory Visit (INDEPENDENT_AMBULATORY_CARE_PROVIDER_SITE_OTHER): Payer: Medicare Other | Admitting: Urology

## 2021-12-27 VITALS — BP 182/67 | HR 78

## 2021-12-27 DIAGNOSIS — R3912 Poor urinary stream: Secondary | ICD-10-CM | POA: Diagnosis not present

## 2021-12-27 DIAGNOSIS — C61 Malignant neoplasm of prostate: Secondary | ICD-10-CM | POA: Diagnosis not present

## 2021-12-27 DIAGNOSIS — N401 Enlarged prostate with lower urinary tract symptoms: Secondary | ICD-10-CM

## 2021-12-27 DIAGNOSIS — Z992 Dependence on renal dialysis: Secondary | ICD-10-CM | POA: Diagnosis not present

## 2021-12-27 DIAGNOSIS — N186 End stage renal disease: Secondary | ICD-10-CM | POA: Diagnosis not present

## 2021-12-27 DIAGNOSIS — N138 Other obstructive and reflux uropathy: Secondary | ICD-10-CM | POA: Diagnosis not present

## 2021-12-27 LAB — URINALYSIS, ROUTINE W REFLEX MICROSCOPIC
Bilirubin, UA: NEGATIVE
Ketones, UA: NEGATIVE
Leukocytes,UA: NEGATIVE
Nitrite, UA: NEGATIVE
RBC, UA: NEGATIVE
Specific Gravity, UA: 1.025 (ref 1.005–1.030)
Urobilinogen, Ur: 0.2 mg/dL (ref 0.2–1.0)
pH, UA: 8.5 — ABNORMAL HIGH (ref 5.0–7.5)

## 2021-12-27 LAB — MICROSCOPIC EXAMINATION

## 2021-12-27 NOTE — Patient Instructions (Signed)
prodProstate Cancer  The prostate is a small gland that helps make semen. It is located below a man's bladder, in front of the rectum. Prostate cancer is when abnormal cells grow in this gland. What are the causes? The cause of this condition is not known. What increases the risk? Being age 80 or older. Having a family history of prostate cancer. Having a family history of cancer of the breasts or ovaries. Having genes that are passed from parent to child (inherited). Having Lynch syndrome. African American men and men of African descent are diagnosed with prostate cancer at higher rates than other men. What are the signs or symptoms? Problems peeing (urinating). This may include: A stream that is weak, or pee that stops and starts. Trouble starting or stopping your pee. Trouble emptying all of your pee. Needing to pee more often, especially at night. Blood in your pee or semen. Pain in the: Lower back. Lower belly (abdomen). Hips. Trouble getting an erection. Weakness or numbness in the legs or feet. How is this treated? Treatment for this condition depends on: How much the cancer has spread. Your age. The kind of treatment you want. Your health. Treatments include: Being watched. This is called observation. You will be tested from time to time, but you will not get treated. Tests are to make sure that the cancer is not growing. Surgery. This may be done to: Take out (remove) the prostate. Freeze and kill cancer cells. Radiation. This uses a strong beam of energy to kill cancer cells. Chemotherapy. This uses medicines that stop cancer cells from increasing. This kills cancer cells and healthy cells. Targeted therapy. This kills cancer cells only. Healthy cells are not affected. Hormone treatment. This stops the body from making hormones that help the cancer cells grow. Follow these instructions at home: Lifestyle Do not smoke or use any products that contain nicotine or  tobacco. If you need help quitting, ask your doctor. Eat a healthy diet. Treatment may affect your ability to have sex. If you have a partner, touch, hold, hug, and caress your partner to have intimate moments. Get plenty of sleep. Ask your doctor for help to find a support group for men with prostate cancer. General instructions Take over-the-counter and prescription medicines only as told by your doctor. If you have to go to the hospital, let your cancer doctor (oncologist) know. Keep all follow-up visits. Where to find more information American Cancer Society: www.cancer.[place] [employer]: www.cancer.net [employer]: www.cancer.gov Contact a doctor if: You have new or more trouble peeing. You have new or more blood in your pee. You have new or more pain in your hips, back, or chest. Get help right away if: You have weakness in your legs. You lose feeling in your legs. You cannot control your pee or your poop (stool). You have chills or a fever. Summary The prostate is a male gland that helps make semen. Prostate cancer is when abnormal cells grow in this gland. Treatment includes doing surgery, using medicines, using strong beams of energy, or watching without treatment. Ask your doctor for help to find a support group for men with prostate cancer. Contact a doctor if you have problems peeing or have any new pain that you did not have before. This information is not intended to replace advice given to you by your health care provider. Make sure you discuss any questions you have with your health care provider. Document Revised: 04/25/2020 Document Reviewed: 04/25/2020  Elsevier Patient Education  2023 [employer]  

## 2021-12-27 NOTE — Progress Notes (Signed)
12/27/2021 12:17 PM   Ronald [name] December 17, 1940 [id number]  Referring provider: Celene Squibb, MD [address],  [place] [address]  Followup BPH and prostate cancer    HPI: Ronald [name] is a 81yo here for followup for prostate cancer and BPH. PSA increased to 1.5 from 0.9 6 months ago. He had IMRT in 2019 as well as ADT. IPSS 12 QOl 1 on flomax 0.'4mg'$  daily   PMH: Past Medical History:  Diagnosis Date   GERD (gastroesophageal reflux disease)    Hypertension    Myocardial infarction [facility])    1999   Prostate cancer ([place]) 02/2018   Renal insufficiency    Stroke ([place])    no deficits; had stroke while trying to place "stents in legs".    Surgical History: Past Surgical History:  Procedure Laterality Date   ABDOMINAL AORTIC ANEURYSM REPAIR  2012   IN [place]   APPENDECTOMY     AV FISTULA PLACEMENT Left 10/16/2020   Procedure: LEFT ARM ARTERIOVENOUS (AV) FISTULA CREATION;  Surgeon: Rosetta Posner, MD;  Location: [facility];  Service: Vascular;  Laterality: Left;   CORONARY ARTERY BYPASS GRAFT  01/1998   3 vessels 18 years ago   [place] Right 09/20/2015   Procedure: REPAIR RIGHT INGUINAL HERNIA  WITH MESH;  Surgeon: Vickie Epley, MD;  Location: [facility];  Service: General;  Laterality: Right;   INSERTION OF DIALYSIS CATHETER Right 08/02/2020   Procedure: INSERTION OF RIGHT TUNNELED INTERNAL JUGULAR  DIALYSIS CATHETER;  Surgeon: Rosetta Posner, MD;  Location: [facility];  Service: Vascular;  Laterality: Right;   REMOVAL OF A DIALYSIS CATHETER N/A 03/19/2021   Procedure: MINOR REMOVAL OF A TUNNELED DIALYSIS CATHETER;  Surgeon: Rosetta Posner, MD;  Location: [facility];  Service: Vascular;  Laterality: N/A;   tumor removal from bladder      Home Medications:  Allergies as of 12/27/2021   No Active Allergies      Medication List        Accurate as of December 27, 2021 12:17 PM. If you have any questions, ask your nurse or doctor.           atorvastatin 80 MG tablet Commonly known as: LIPITOR Take 80 mg by mouth in the morning.   calcium carbonate 1500 (600 Ca) MG Tabs tablet Commonly known as: OSCAL Take 600 mg by mouth in the morning.   carvedilol 12.5 MG tablet Commonly known as: COREG Take 6.25 mg by mouth 2 (two) times daily with a meal.   lidocaine-prilocaine cream Commonly known as: EMLA Apply 1 application topically as needed (prior to dialysis).   magnesium oxide 400 MG tablet Commonly known as: MAG-OX Take 400 mg by mouth every evening.   MAGNESIUM-POTASSIUM PO Take 1 tablet by mouth in the morning. 210 mg-99 mg   memantine 5 MG tablet Commonly known as: NAMENDA Take 1 tablet (5 mg [name] night) for 2 weeks, then increase to 1 tablet (5 mg) twice a day   tamsulosin 0.4 MG Caps capsule Commonly known as: FLOMAX TAKE 1 CAPSULE BY MOUTH EVERY DAY AFTER SUPPER   traMADol 50 MG tablet Commonly known as: ULTRAM Take 50 mg by mouth 3 (three) times daily as needed (pain.).   zolpidem 10 MG tablet Commonly known as: AMBIEN Take 10 mg by mouth [name] bedtime.        Allergies: No Active Allergies  Family History: Family History  Problem Relation Age of  Onset   Cancer Father     Social History:  reports that he has quit smoking. His smoking use included cigarettes. He started smoking about 64 years ago. He has a 100.00 pack-year smoking history. He has never used smokeless tobacco. He reports that he does not drink alcohol and does not use drugs.  ROS: All other review of systems were reviewed and are negative except what is noted above in HPI  Physical Exam: BP (!) 182/67   Pulse 78   Constitutional:  Alert and oriented, No acute distress. HEENT: Ronald [name], moist mucus membranes.  Trachea midline, no masses. Cardiovascular: No clubbing, cyanosis, or edema. Respiratory: Normal respiratory effort, no increased work of breathing. GI: Abdomen is soft, nontender, nondistended, no abdominal masses GU:  No CVA tenderness.  Lymph: No cervical or inguinal lymphadenopathy. Skin: No rashes, bruises or suspicious lesions. Neurologic: Grossly intact, no focal deficits, moving all 4 extremities. Psychiatric: Normal mood and affect.  Laboratory Data: Lab Results  Component Value Date   WBC 4.7 08/10/2019   HGB 11.9 (L) 10/16/2020   HCT 35.0 (L) 10/16/2020   MCV 90.4 08/10/2019   PLT 160 08/10/2019    Lab Results  Component Value Date   CREATININE 6.50 (H) 10/16/2020    Lab Results  Component Value Date   PSA <0.1 03/15/2019    Lab Results  Component Value Date   TESTOSTERONE 124 (L) 03/26/2020    No results found for: "HGBA1C"  Urinalysis    Component Value Date/Time   APPEARANCEUR Clear 06/14/2021 0944   GLUCOSEU Negative 06/14/2021 0944   BILIRUBINUR Negative 06/14/2021 0944   PROTEINUR 3+ (A) 06/14/2021 0944   UROBILINOGEN 0.2 05/17/2019 0917   NITRITE Negative 06/14/2021 0944   LEUKOCYTESUR Negative 06/14/2021 0944    Lab Results  Component Value Date   LABMICR See below: 06/14/2021   WBCUA None seen 06/14/2021   LABEPIT 0-10 06/14/2021   MUCUS Present 06/14/2021   BACTERIA None seen 06/14/2021    Pertinent Imaging:  Results for orders placed during the hospital encounter of 03/29/18  DG Abd 1 View  Narrative CLINICAL DATA:  Constipation for a few weeks.  EXAM: ABDOMEN - 1 VIEW  COMPARISON:  None.  FINDINGS: Normal bowel gas pattern without evidence of obstruction.  Mild generalized increased colonic stool burden.  No evidence of renal or ureteral stones.  Aortic stent graft extends from the lower aspect of L2 to common iliac artery arms over the upper sacrum.  Skeletal structures intact.  IMPRESSION: 1. No acute findings.  No evidence of bowel obstruction. 2. Mild generalized increased colonic stool burden.   Electronically Signed By: Lajean Manes M.D. On: 03/30/2018 08:52  No results found for this or any previous visit.  No  results found for this or any previous visit.  No results found for this or any previous visit.  Results for orders placed during the hospital encounter of 07/14/16  US Renal  Narrative CLINICAL DATA:  Protein urea, chronic renal insufficiency, assess postvoid residual volume  EXAM: RENAL / URINARY TRACT ULTRASOUND COMPLETE  COMPARISON:  Renal ultrasound of December 22, 2014  FINDINGS: Right Kidney:  Length: 12.2 cm. The renal cortical echotexture is somewhat greater than that of the adjacent liver. Multiple cysts of present 2.7 in diameter. There is no hydronephrosis.  Left Kidney:  Length: 11.3 cm. The renal cortical echotexture is similar to that of the right kidney. There are multiple cystic structures measuring up to 1.4 cm in diameter.  There is no hydronephrosis.  Bladder:  Urinary bladder exhibits diffuse wall thickening. The prostate gland produces a prominent impression upon the bladder base. The prevoid volume was 187 cc. The postvoid volume is 28 cc.  IMPRESSION: Multiple simple appearing renal cysts more conspicuous than on the previous study. Increased renal cortical echotexture consistent with medical renal disease. No hydronephrosis.  Thickened gallbladder wall.  28 cc postvoid residual volume.   Electronically Signed By: David  Martinique M.D. On: 07/14/2016 11:40  No valid procedures specified. No results found for this or any previous visit.  No results found for this or any previous visit.   Assessment & Plan:    1. Benign prostatic hyperplasia with urinary obstruction Continue flomax 0.'4mg'$  daily - Urinalysis, Routine w reflex microscopic  2. Weak urinary stream Continue flomax 0.'4mg'$  daily  3. Prostate cancer ([place]) Followup 3 months with PSA   No follow-ups on file.  Nicolette Bang, MD  [facility] [place]

## 2021-12-30 DIAGNOSIS — Z992 Dependence on renal dialysis: Secondary | ICD-10-CM | POA: Diagnosis not present

## 2021-12-30 DIAGNOSIS — N186 End stage renal disease: Secondary | ICD-10-CM | POA: Diagnosis not present

## 2022-01-01 DIAGNOSIS — N186 End stage renal disease: Secondary | ICD-10-CM | POA: Diagnosis not present

## 2022-01-01 DIAGNOSIS — Z992 Dependence on renal dialysis: Secondary | ICD-10-CM | POA: Diagnosis not present

## 2022-01-05 ENCOUNTER — Encounter: Payer: Self-pay | Admitting: Urology

## 2022-01-05 MED ORDER — TAMSULOSIN HCL 0.4 MG PO CAPS: 0.4000 mg | ORAL_CAPSULE | Freq: Every day | ORAL | 3 refills | Status: DC

## 2022-01-06 DIAGNOSIS — Z992 Dependence on renal dialysis: Secondary | ICD-10-CM | POA: Diagnosis not present

## 2022-01-06 DIAGNOSIS — N186 End stage renal disease: Secondary | ICD-10-CM | POA: Diagnosis not present

## 2022-01-08 DIAGNOSIS — N186 End stage renal disease: Secondary | ICD-10-CM | POA: Diagnosis not present

## 2022-01-08 DIAGNOSIS — Z992 Dependence on renal dialysis: Secondary | ICD-10-CM | POA: Diagnosis not present

## 2022-01-09 DIAGNOSIS — Z992 Dependence on renal dialysis: Secondary | ICD-10-CM | POA: Diagnosis not present

## 2022-01-09 DIAGNOSIS — N186 End stage renal disease: Secondary | ICD-10-CM | POA: Diagnosis not present

## 2022-01-10 DIAGNOSIS — N186 End stage renal disease: Secondary | ICD-10-CM | POA: Diagnosis not present

## 2022-01-10 DIAGNOSIS — Z992 Dependence on renal dialysis: Secondary | ICD-10-CM | POA: Diagnosis not present

## 2022-01-13 DIAGNOSIS — Z992 Dependence on renal dialysis: Secondary | ICD-10-CM | POA: Diagnosis not present

## 2022-01-13 DIAGNOSIS — N186 End stage renal disease: Secondary | ICD-10-CM | POA: Diagnosis not present

## 2022-01-15 DIAGNOSIS — N186 End stage renal disease: Secondary | ICD-10-CM | POA: Diagnosis not present

## 2022-01-15 DIAGNOSIS — Z992 Dependence on renal dialysis: Secondary | ICD-10-CM | POA: Diagnosis not present

## 2022-01-17 DIAGNOSIS — N186 End stage renal disease: Secondary | ICD-10-CM | POA: Diagnosis not present

## 2022-01-17 DIAGNOSIS — Z992 Dependence on renal dialysis: Secondary | ICD-10-CM | POA: Diagnosis not present

## 2022-01-20 DIAGNOSIS — Z992 Dependence on renal dialysis: Secondary | ICD-10-CM | POA: Diagnosis not present

## 2022-01-20 DIAGNOSIS — N186 End stage renal disease: Secondary | ICD-10-CM | POA: Diagnosis not present

## 2022-01-24 DIAGNOSIS — Z992 Dependence on renal dialysis: Secondary | ICD-10-CM | POA: Diagnosis not present

## 2022-01-24 DIAGNOSIS — N186 End stage renal disease: Secondary | ICD-10-CM | POA: Diagnosis not present

## 2022-01-27 DIAGNOSIS — Z992 Dependence on renal dialysis: Secondary | ICD-10-CM | POA: Diagnosis not present

## 2022-01-27 DIAGNOSIS — N186 End stage renal disease: Secondary | ICD-10-CM | POA: Diagnosis not present

## 2022-01-29 DIAGNOSIS — Z992 Dependence on renal dialysis: Secondary | ICD-10-CM | POA: Diagnosis not present

## 2022-01-29 DIAGNOSIS — N186 End stage renal disease: Secondary | ICD-10-CM | POA: Diagnosis not present

## 2022-01-29 NOTE — Progress Notes (Signed)
Cardiology Office Note    Date:  02/06/2022   ID:  Ronald [name], DOB [birth year], MRN [id number]   PCP:  Celene Squibb, [place]  Cardiologist:  Jenkins Rouge, MD  Advanced Practice Provider:  No care team member to display Electrophysiologist:  None     History of Present Illness:  Ronald [name] is a 80 y.o. male with history of hypertension, hyperlipidemia, CKD, CAD status post CABG 1999 cath 2009 LIMA patent, SVG to the circumflex patent and native circumflex to RCA collaterals with total occlusion of native coronary arteries.  All done in [place]. Had a mini stroke during a procedure in [place] as well.  Had AAA repair 01/29/2012 op note reported small type II endoleak at end of procedure.  Patient was seen for the first time by me 07/2016 and exercise Myoview ordered because of likely need for dialysis and surgery for fistula.  Patient had hypertensive response to exercise prior scar no ischemia LVEF 56% low risk study.  2D echo 05/11/2019 LVEF 55 to 60% grade 2 DD  Seen by PA 08/06/20 BP up had central catheter for dialysis access Subsequently had left radio-cephalic AV fistula with Dr Donnetta Hutching 10/16/20 Previously had right tunneled IJ catheter on 08/02/20 K 4.2 Cr 6.5   TTE 09/13/20 EF 50-55% mild AR Myovue 09/12/20 fixed defect in mid/apical anteroseptal wall and apical to basal inferior lateral wall no ischemia EF poorly estimated 31% frequent PAC short bursts of SVT Was not taking coreg at time    From [place] Lived in [place] Has two older children son in [place] and daughter in [place] Wife Is younger and healthy   Dialysis going well Sees Early and has left radiocephalic fistula now had some memory issues and noted 50-69% left ICA stenosis 07/23/21 Has not mentioned f/u for his abdominal stent graft  Memory seems worse today no cardiac symptoms   Past Medical History:  Diagnosis Date   GERD (gastroesophageal reflux disease)    Hypertension    Myocardial  infarction ([place])    1999   Prostate cancer ([place]) 02/2018   Renal insufficiency    Stroke ([place])    no deficits; had stroke while trying to place "stents in legs".    Past Surgical History:  Procedure Laterality Date   ABDOMINAL AORTIC ANEURYSM REPAIR  2012   IN [place]   APPENDECTOMY     AV FISTULA PLACEMENT Left 10/16/2020   Procedure: LEFT ARM ARTERIOVENOUS (AV) FISTULA CREATION;  Surgeon: Rosetta Posner, MD;  Location: [facility];  Service: Vascular;  Laterality: Left;   CORONARY ARTERY BYPASS GRAFT  01/1998   3 vessels 18 years ago   [place] Right 09/20/2015   Procedure: REPAIR RIGHT INGUINAL HERNIA  WITH MESH;  Surgeon: Vickie Epley, MD;  Location: [facility];  Service: General;  Laterality: Right;   INSERTION OF DIALYSIS CATHETER Right 08/02/2020   Procedure: INSERTION OF RIGHT TUNNELED INTERNAL JUGULAR  DIALYSIS CATHETER;  Surgeon: Rosetta Posner, MD;  Location: [facility];  Service: Vascular;  Laterality: Right;   REMOVAL OF A DIALYSIS CATHETER N/A 03/19/2021   Procedure: MINOR REMOVAL OF A TUNNELED DIALYSIS CATHETER;  Surgeon: Rosetta Posner, MD;  Location: [facility];  Service: Vascular;  Laterality: N/A;   tumor removal from bladder      Current Medications: No outpatient medications have been marked as taking for the 02/06/22 encounter (Office Visit) with Josue Hector, MD.  Allergies:   Patient has no active allergies.   Social History   Socioeconomic History   Marital status: Married    Spouse name: Not on file   Number of children: Not on file   Years of education: Not on file   Highest education level: Not on file  Occupational History   Not on file  Tobacco Use   Smoking status: Former    Packs/day: 2.00    Years: 50.00    Total pack years: 100.00    Types: Cigarettes    Start date: 08/07/1957   Smokeless tobacco: Never   Tobacco comments:    on chantix now  Vaping Use   Vaping Use: Never used  Substance and Sexual Activity   Alcohol use: No    Drug use: No   Sexual activity: Yes  Other Topics Concern   Not on file  Social History Narrative   Right handed   Drinks caffeine   One story home   Social Determinants of Health   Financial Resource Strain: Not on file  Food Insecurity: Not on file  Transportation Needs: Not on file  Physical Activity: Not on file  Stress: Not on file  Social Connections: Not on file     Family History:  The patient's  family history includes Cancer in his father.   ROS:   Please see the history of present illness.    ROS All other systems reviewed and are negative.   PHYSICAL EXAM:   VS:  There were no vitals taken for this visit.  Physical Exam   Affect appropriate Healthy:  appears stated age 55: normal Neck right IJ tunneled catheter removed  JVP normal no bruits no thyromegaly Lungs clear with no wheezing and good diaphragmatic motion Heart:  S1/S2 SEM murmur, no rub, gallop or click PMI normal Abdomen: benighn, BS positve, no tenderness, no AAA no bruit.  No HSM or HJR Distal pulses intact with no bruits No edema Neuro non-focal Skin warm and dry No muscular weakness Left arm fistula with nice thrill    Wt Readings from Last 3 Encounters:  08/14/21 211 lb (95.7 kg)  08/05/21 208 lb (94.3 kg)  11/21/20 202 lb (91.6 kg)      Studies/Labs Reviewed:   EKG:   SR PaCls LVH rate 74 08/02/20 02/06/2022 SR rate 58 PAC LAD/RBBB   Recent Labs: No results found for requested labs within last 365 days.   Lipid Panel No results found for: "CHOL", "TRIG", "HDL", "CHOLHDL", "VLDL", "[place]", "LDLDIRECT"  Additional studies/ records that were reviewed today include:   Echo:  09/13/20:  IMPRESSIONS     1. Left ventricular ejection fraction, by estimation, is 50 to 55%. The  left ventricle has low normal function. The left ventricle demonstrates  regional wall motion abnormalities (see scoring diagram/findings for  description). The left ventricular  internal cavity  size was mildly dilated. There is mild left ventricular  hypertrophy. Left ventricular diastolic parameters are consistent with  Grade I diastolic dysfunction (impaired relaxation).   2. Right ventricular systolic function is normal. The right ventricular  size is normal. Tricuspid regurgitation signal is inadequate for assessing  PA pressure.   3. The mitral valve is grossly normal. No evidence of mitral valve  regurgitation.   4. The aortic valve is tricuspid. Aortic valve regurgitation is mild.  Aortic regurgitation PHT measures 545 msec.   5. The inferior vena cava is normal in size with greater than 50%  respiratory variability, suggesting  right atrial pressure of 3 mmHg.   Comparison(s): Prior images reviewed side by side. LVEF now low normal  range at 50-55%. Basal inferior hypokinesis looks to be old.   Myovue :  Study Result  Narrative & Impression  No diagnostic ST segment changes to indicate ischemia. Frequent PACs and bursts of SVT/atrial tachycardia noted throughout the study. Patient asymptomatic in terms of palpitations. Small to medium sized, moderate intensity, fixed defects involving the mid to apical anteroseptal wall and apical to basal inferolateral wall. No large ischemic territories are demonstrated. Most consistent with infarct scar. This is a high risk study based on calculated LVEF. Nuclear stress EF: 31%. Suggest echocardiogram for more complete assessment of LVEF and possibility of ischemic cardiomyopathy.       Risk Assessment/Calculations:         ASSESSMENT:    1. Hx of CABG       PLAN:   CAD : status post CABG 1999 cath 2009 LIMA patent, SVG to the circumflex patent and native circumflex to RCA collaterals with total occlusion of native coronary arteries.  Cardiac cath years ago with aborted intervention because of CVA during procedure.  Non ischemic myovue 8/3/222 continue medical Rx  ESRD has right IJ tunneled catheter and now fistula in  LUE f/u nephrology and Dr Donnetta Hutching [facility]   AAA : endovascular repair with endoleak/residual AAA last CT 2019 and for renal abdominal aortic aneurysm measuring at least 7.0 cm partially visualized aortobiiliac stent graft.  Needs to be followed by Dr. Donnetta Hutching  Hypertension Well controlled.  Continue current medications and low sodium Dash type diet.    Hyperlipidemia on Lipitor managed by PCP  SVT:  with PACls continue beta blocker   Carotid:  16-94% LICA f/u duplex June 2024    Carotid duplex June 2024 Abdominal/Pelvic CT with contrast    F/U Cardiology in a year     Signed, Jenkins Rouge, MD  02/06/2022 2:08 PM    [facility] [address], [place], [place]  [address] Phone: [phone]; Fax: [phone]

## 2022-02-01 DIAGNOSIS — Z992 Dependence on renal dialysis: Secondary | ICD-10-CM | POA: Diagnosis not present

## 2022-02-01 DIAGNOSIS — N186 End stage renal disease: Secondary | ICD-10-CM | POA: Diagnosis not present

## 2022-02-06 ENCOUNTER — Encounter: Payer: Self-pay | Admitting: Cardiovascular Disease

## 2022-02-06 ENCOUNTER — Other Ambulatory Visit
Admission: RE | Admit: 2022-02-06 | Discharge: 2022-02-06 | Disposition: A | Discharge status: Home / Self Care | Payer: Medicare Other | Source: Ambulatory Visit | Attending: Cardiovascular Disease | Admitting: Cardiovascular Disease

## 2022-02-06 ENCOUNTER — Ambulatory Visit (INDEPENDENT_AMBULATORY_CARE_PROVIDER_SITE_OTHER): Payer: Medicare Other | Admitting: Cardiovascular Disease

## 2022-02-06 VITALS — BP 164/80 | HR 58 | Ht 74.0 in | Wt 208.6 lb

## 2022-02-06 DIAGNOSIS — I714 Abdominal aortic aneurysm, without rupture, unspecified: Secondary | ICD-10-CM | POA: Insufficient documentation

## 2022-02-06 DIAGNOSIS — Z951 Presence of aortocoronary bypass graft: Secondary | ICD-10-CM | POA: Diagnosis not present

## 2022-02-06 DIAGNOSIS — I10 Essential (primary) hypertension: Secondary | ICD-10-CM

## 2022-02-06 DIAGNOSIS — E782 Mixed hyperlipidemia: Secondary | ICD-10-CM

## 2022-02-06 DIAGNOSIS — N186 End stage renal disease: Secondary | ICD-10-CM

## 2022-02-06 DIAGNOSIS — Z9889 Other specified postprocedural states: Secondary | ICD-10-CM

## 2022-02-06 DIAGNOSIS — Z8679 Personal history of other diseases of the circulatory system: Secondary | ICD-10-CM | POA: Diagnosis not present

## 2022-02-06 LAB — BASIC METABOLIC PANEL
Anion gap: 12 (ref 5–15)
BUN: 34 mg/dL — ABNORMAL HIGH (ref 8–23)
CO2: 26 mmol/L (ref 22–32)
Calcium: 8.3 mg/dL — ABNORMAL LOW (ref 8.9–10.3)
Chloride: 98 mmol/L (ref 98–111)
Creatinine, Ser: 6.63 mg/dL — ABNORMAL HIGH (ref 0.61–1.24)
GFR, Estimated: 8 mL/min — ABNORMAL LOW (ref >60)
Glucose, Bld: 87 mg/dL (ref 70–99)
Potassium: 4.2 mmol/L (ref 3.5–5.1)
Sodium: 136 mmol/L (ref 135–145)

## 2022-02-06 NOTE — Patient Instructions (Signed)
Medication Instructions:  Your physician recommends that you continue on your current medications as directed. Please refer to the Current Medication list given to you today.    Labwork: BMET  Testing/Procedures: Abdominal/Pelvic CT  Follow-Up: Follow up with Dr. Donnetta Hutching after CT. Follow up with Dr. Johnsie Cancel in 1 year.   Any Other Special Instructions Will Be Listed Below (If Applicable).     If you need a refill on your cardiac medications before your next appointment, please call your pharmacy.

## 2022-02-07 DIAGNOSIS — N186 End stage renal disease: Secondary | ICD-10-CM | POA: Diagnosis not present

## 2022-02-07 DIAGNOSIS — Z992 Dependence on renal dialysis: Secondary | ICD-10-CM | POA: Diagnosis not present

## 2022-02-09 DIAGNOSIS — Z992 Dependence on renal dialysis: Secondary | ICD-10-CM | POA: Diagnosis not present

## 2022-02-09 DIAGNOSIS — N186 End stage renal disease: Secondary | ICD-10-CM | POA: Diagnosis not present

## 2022-02-12 DIAGNOSIS — Z992 Dependence on renal dialysis: Secondary | ICD-10-CM | POA: Diagnosis not present

## 2022-02-12 DIAGNOSIS — N186 End stage renal disease: Secondary | ICD-10-CM | POA: Diagnosis not present

## 2022-02-14 DIAGNOSIS — N186 End stage renal disease: Secondary | ICD-10-CM | POA: Diagnosis not present

## 2022-02-14 DIAGNOSIS — Z992 Dependence on renal dialysis: Secondary | ICD-10-CM | POA: Diagnosis not present

## 2022-02-17 DIAGNOSIS — N186 End stage renal disease: Secondary | ICD-10-CM | POA: Diagnosis not present

## 2022-02-17 DIAGNOSIS — Z992 Dependence on renal dialysis: Secondary | ICD-10-CM | POA: Diagnosis not present

## 2022-02-19 DIAGNOSIS — Z992 Dependence on renal dialysis: Secondary | ICD-10-CM | POA: Diagnosis not present

## 2022-02-19 DIAGNOSIS — N186 End stage renal disease: Secondary | ICD-10-CM | POA: Diagnosis not present

## 2022-02-21 DIAGNOSIS — N186 End stage renal disease: Secondary | ICD-10-CM | POA: Diagnosis not present

## 2022-02-21 DIAGNOSIS — Z992 Dependence on renal dialysis: Secondary | ICD-10-CM | POA: Diagnosis not present

## 2022-02-24 DIAGNOSIS — Z992 Dependence on renal dialysis: Secondary | ICD-10-CM | POA: Diagnosis not present

## 2022-02-24 DIAGNOSIS — N186 End stage renal disease: Secondary | ICD-10-CM | POA: Diagnosis not present

## 2022-02-26 DIAGNOSIS — Z992 Dependence on renal dialysis: Secondary | ICD-10-CM | POA: Diagnosis not present

## 2022-02-26 DIAGNOSIS — N186 End stage renal disease: Secondary | ICD-10-CM | POA: Diagnosis not present

## 2022-02-28 DIAGNOSIS — N186 End stage renal disease: Secondary | ICD-10-CM | POA: Diagnosis not present

## 2022-02-28 DIAGNOSIS — Z992 Dependence on renal dialysis: Secondary | ICD-10-CM | POA: Diagnosis not present

## 2022-03-03 DIAGNOSIS — H4312 Vitreous hemorrhage, left eye: Secondary | ICD-10-CM | POA: Diagnosis not present

## 2022-03-04 ENCOUNTER — Ambulatory Visit (INDEPENDENT_AMBULATORY_CARE_PROVIDER_SITE_OTHER): Payer: 59 | Admitting: Ophthalmology

## 2022-03-04 ENCOUNTER — Encounter (INDEPENDENT_AMBULATORY_CARE_PROVIDER_SITE_OTHER): Payer: Self-pay | Admitting: Ophthalmology

## 2022-03-04 DIAGNOSIS — H353132 Nonexudative age-related macular degeneration, bilateral, intermediate dry stage: Secondary | ICD-10-CM | POA: Diagnosis not present

## 2022-03-04 DIAGNOSIS — H35033 Hypertensive retinopathy, bilateral: Secondary | ICD-10-CM | POA: Diagnosis not present

## 2022-03-04 DIAGNOSIS — I10 Essential (primary) hypertension: Secondary | ICD-10-CM

## 2022-03-04 DIAGNOSIS — Z961 Presence of intraocular lens: Secondary | ICD-10-CM

## 2022-03-04 DIAGNOSIS — H401132 Primary open-angle glaucoma, bilateral, moderate stage: Secondary | ICD-10-CM | POA: Diagnosis not present

## 2022-03-04 DIAGNOSIS — H4312 Vitreous hemorrhage, left eye: Secondary | ICD-10-CM | POA: Diagnosis not present

## 2022-03-04 MED ORDER — DORZOLAMIDE HCL-TIMOLOL MAL 2-0.5 % OP SOLN: 1.0000 [drp] | Freq: Two times a day (BID) | OPHTHALMIC | 5 refills | Status: DC

## 2022-03-04 MED ORDER — BRIMONIDINE TARTRATE 0.2 % OP SOLN: 1.0000 [drp] | Freq: Two times a day (BID) | OPHTHALMIC | 5 refills | Status: DC

## 2022-03-04 NOTE — Progress Notes (Signed)
[facility] Note  03/04/2022     CHIEF COMPLAINT Patient presents for Retina Evaluation   HISTORY OF PRESENT ILLNESS: Ronald [name] is a 82 y.o. male who presents to the clinic today for:   HPI     Retina Evaluation   In left eye.  This started 1 day ago.  Duration of 1 day.  Associated Symptoms Floaters.  Context:  distance vision and mid-range vision.  I, the attending physician,  performed the HPI with the patient and updated documentation appropriately.        Comments   Retina eval per Dr Jorja Loa for vit hem os pt is reporting blurred vision in the left eye he sees several floaters but denies flashes of light       Last edited by Bernarda Caffey, MD on 03/06/2022  5:21 PM.    Pt is here on the referral of Dr. Jorja Loa for vitreous hemorrhage OS, pt states he cannot remember exactly when he lost vision, pt states he cannot see clearly, especially out of the left eye, he states he is seeing hair-like strands in his vision, he has not been doing any physical activities or straining, pt does not take blood thinners, but he is on dialysis (M,W,F)  Referring physician: Madelin Headings, DO [address] [place],  [place] [address]  HISTORICAL INFORMATION:   Selected notes from the MEDICAL RECORD NUMBER Referred by Dr. Jorja Loa for [facility] LEE:  Ocular Hx- PMH-    CURRENT MEDICATIONS: Current Outpatient Medications (Ophthalmic Drugs)  Medication Sig   brimonidine (ALPHAGAN) 0.2 % ophthalmic solution Place 1 drop into the right eye 2 (two) times daily.   dorzolamide-timolol (COSOPT) 2-0.5 % ophthalmic solution Place 1 drop into the right eye 2 (two) times daily.   No current facility-administered medications for this visit. (Ophthalmic Drugs)   Current Outpatient Medications (Other)  Medication Sig   atorvastatin (LIPITOR) 80 MG tablet Take 80 mg by mouth in the morning.   carvedilol (COREG) 12.5 MG tablet Take 6.25 mg by mouth 2 (two) times daily  with a meal.   magnesium oxide (MAG-OX) 400 MG tablet Take 400 mg by mouth every evening.   memantine (NAMENDA) 5 MG tablet Take 1 tablet (5 mg at night) for 2 weeks, then increase to 1 tablet (5 mg) twice a day   tamsulosin (FLOMAX) 0.4 MG CAPS capsule Take 1 capsule (0.4 mg total) by mouth daily after supper.   traMADol (ULTRAM) 50 MG tablet Take 50 mg by mouth 3 (three) times daily as needed (pain.).   zolpidem (AMBIEN) 10 MG tablet Take 10 mg by mouth at bedtime.   No current facility-administered medications for this visit. (Other)   REVIEW OF SYSTEMS: ROS   Positive for: Eyes Last edited by Bernarda Caffey, MD on 03/06/2022  5:21 PM.     ALLERGIES No Active Allergies  PAST MEDICAL HISTORY Past Medical History:  Diagnosis Date   GERD (gastroesophageal reflux disease)    Hypertension    Myocardial infarction [facility])    1999   Prostate cancer ([place]) 02/2018   Renal insufficiency    Stroke ([place])    no deficits; had stroke while trying to place "stents in legs".   Past Surgical History:  Procedure Laterality Date   ABDOMINAL AORTIC ANEURYSM REPAIR  2012   IN [place]   APPENDECTOMY     AV FISTULA PLACEMENT Left 10/16/2020   Procedure: LEFT ARM ARTERIOVENOUS (AV) FISTULA CREATION;  Surgeon:  Rosetta Posner, MD;  Location: [facility];  Service: Vascular;  Laterality: Left;   CORONARY ARTERY BYPASS GRAFT  01/1998   3 vessels 18 years ago   [place] Right 09/20/2015   Procedure: REPAIR RIGHT INGUINAL HERNIA  WITH MESH;  Surgeon: Vickie Epley, MD;  Location: [facility];  Service: General;  Laterality: Right;   INSERTION OF DIALYSIS CATHETER Right 08/02/2020   Procedure: INSERTION OF RIGHT TUNNELED INTERNAL JUGULAR  DIALYSIS CATHETER;  Surgeon: Rosetta Posner, MD;  Location: [facility];  Service: Vascular;  Laterality: Right;   REMOVAL OF A DIALYSIS CATHETER N/A 03/19/2021   Procedure: MINOR REMOVAL OF A TUNNELED DIALYSIS CATHETER;  Surgeon: Rosetta Posner, MD;  Location: [facility];   Service: Vascular;  Laterality: N/A;   tumor removal from bladder     FAMILY HISTORY Family History  Problem Relation Age of Onset   Cancer Father    SOCIAL HISTORY Social History   Tobacco Use   Smoking status: Former    Packs/day: 2.00    Years: 50.00    Total pack years: 100.00    Types: Cigarettes    Start date: 08/07/1957   Smokeless tobacco: Never   Tobacco comments:    on chantix now  Vaping Use   Vaping Use: Never used  Substance Use Topics   Alcohol use: No   Drug use: No       OPHTHALMIC EXAM:  Base Eye Exam     Visual Acuity (Snellen - Linear)       Right Left   Dist [place] 20/30 -2 20/150 -2   Dist ph [place] NI NI         Tonometry (Tonopen, 8:24 AM)       Right Left   Pressure 22 12         Pupils       Pupils Dark Light Shape React APD   Right PERRL 3 2 Round Sluggish None   Left PERRL 2 1 Round Sluggish None         Visual Fields       Left Right    Full Full         Extraocular Movement       Right Left    Full, Ortho Full, Ortho         Dilation     Both eyes: 2.5% Phenylephrine @ 8:25 AM           Slit Lamp and Fundus Exam     Slit Lamp Exam       Right Left   Lids/Lashes Dermatochalasis - upper lid, mild MGD Dermatochalasis - upper lid, mild MGD   Conjunctiva/Sclera White and quiet White and quiet   Cornea 2+ inferior Punctate epithelial erosions, mild arcus, well healed cataract wound 2+ inferior Punctate epithelial erosions, mild arcus, well healed cataract wound   Anterior Chamber deep and clear deep and clear   Iris Round and dilated Round and dilated   Lens PC IOL in good position PC IOL in good position, 1+ Posterior capsular opacification   Anterior Vitreous mild syneresis +RBC and Vitreous syneresis in anterior vit, blood stained vitreous condensations greatest centrally         Fundus Exam       Right Left   Disc +Pallor, +cupping, very thin rims obscured by heme   C/D Ratio 0.85 0.85   Macula  Flat, Blunted foveal reflex, Drusen, central PED, mild ERM, focal blot  heme SN mac obscured by heme, grossly flat   Vessels mild attenuation, mild tortuosity, mild copper wiring mild attenuation, mild tortuosity, mild copper wiring   Periphery Attached Attached, Scattered pre-retinal heme greatest inferiorly           IMAGING AND PROCEDURES  Imaging and Procedures for 03/04/2022  OCT, Retina - OU - Both Eyes       Right Eye Quality was good. Central Foveal Thickness: 253. Progression has no prior data. Findings include normal foveal contour, no IRF, no SRF, retinal drusen , pigment epithelial detachment.   Left Eye Quality was poor. Progression has no prior data.   Notes *Images captured and stored on drive  Diagnosis / Impression:  OD: non-exu ARMD with central drusen and PED OS: no view  Clinical management:  See below  Abbreviations: NFP - Normal foveal profile. CME - cystoid macular edema. PED - pigment epithelial detachment. IRF - intraretinal fluid. SRF - subretinal fluid. EZ - ellipsoid zone. ERM - epiretinal membrane. ORA - outer retinal atrophy. ORT - outer retinal tubulation. SRHM - subretinal hyper-reflective material. IRHM - intraretinal hyper-reflective material            ASSESSMENT/PLAN:    ICD-10-CM   1. Vitreous hemorrhage of left eye (HCC)  H43.12     2. Intermediate stage nonexudative age-related macular degeneration of both eyes  H35.3132 OCT, Retina - OU - Both Eyes    3. Essential hypertension  I10     4. Hypertensive retinopathy of both eyes  H35.033     5. Pseudophakia, both eyes  Z96.1     6. Primary open angle glaucoma (POAG) of both eyes, moderate stage  H40.1132      Vitreous hemorrhage OS  - pt with poor memory, but reports acute floaters, onset less than 1 wk ago  - unclear etiology but risk factors include -- hemorrhagic PVD, htn retinopathy, ESRD on dialysis -- gets heparin during dialysis  - BCVA 20/150 OS  - exam shows blood  stained vit condensations greatest centrally  - discussed findings, prognosis, treatment options  - will hold off on anti-VEGF at this time - VH precautions reviewed -- minimize activities, keep head elevated, avoid ASA/NSAIDs/blood thinners as able - f/u 1 week, DFE, OCT  2. Age related macular degeneration, non-exudative, both eyes  - The incidence, anatomy, and pathology of dry AMD, risk of progression, and the AREDS and AREDS 2 study including smoking risks discussed with patient.  - Recommend amsler grid monitoring  - f/u 3 months  3,4. Hypertensive retinopathy OU - discussed importance of tight BP control - monitor  5. Pseudophakia OU  - s/p CE/IOL  - IOL in good position, doing well  - monitor  6. Glaucoma  - IOP today: 22,12  - disc OD with pallor and cupping; c/d 0.85  - will start Cosopt and brimonidine BID OD only  Ophthalmic Meds Ordered this visit:  Meds ordered this encounter  Medications   dorzolamide-timolol (COSOPT) 2-0.5 % ophthalmic solution    Sig: Place 1 drop into the right eye 2 (two) times daily.    Dispense:  10 mL    Refill:  5   brimonidine (ALPHAGAN) 0.2 % ophthalmic solution    Sig: Place 1 drop into the right eye 2 (two) times daily.    Dispense:  10 mL    Refill:  5     Return in about 1 week (around 03/11/2022) for f/u VH OS, DFE, OCT.  There  are no Patient Instructions on file for this visit.   Explained the diagnoses, plan, and follow up with the patient and they expressed understanding.  Patient expressed understanding of the importance of proper follow up care.   This document serves as a record of services personally performed by Gardiner Sleeper, MD, PhD. It was created on their behalf by San Jetty. Owens Shark, OA an ophthalmic technician. The creation of this record is the provider's dictation and/or activities during the visit.    Electronically signed by: San Jetty. Owens Shark, [place] 01.23.2024 5:23 PM   Gardiner Sleeper, M.D., Ph.D. Diseases  & Surgery of the Retina and Vitreous [facility]  I have reviewed the above documentation for accuracy and completeness, and I agree with the above. Gardiner Sleeper, M.D., Ph.D. 03/06/22 7:51 PM   Abbreviations: M myopia (nearsighted); A astigmatism; H hyperopia (farsighted); P presbyopia; Mrx spectacle prescription;  CTL contact lenses; OD right eye; OS left eye; OU both eyes  XT exotropia; ET esotropia; PEK punctate epithelial keratitis; PEE punctate epithelial erosions; DES dry eye syndrome; MGD meibomian gland dysfunction; ATs artificial tears; PFAT's preservative free artificial tears; [place] nuclear sclerotic cataract; PSC posterior subcapsular cataract; ERM epi-retinal membrane; PVD posterior vitreous detachment; RD retinal detachment; DM diabetes mellitus; DR diabetic retinopathy; NPDR non-proliferative diabetic retinopathy; PDR proliferative diabetic retinopathy; CSME clinically significant macular edema; DME diabetic macular edema; dbh dot blot hemorrhages; CWS cotton wool spot; POAG primary open angle glaucoma; C/D cup-to-disc ratio; HVF humphrey visual field; GVF goldmann visual field; OCT optical coherence tomography; IOP intraocular pressure; BRVO Branch retinal vein occlusion; CRVO central retinal vein occlusion; CRAO central retinal artery occlusion; BRAO branch retinal artery occlusion; RT retinal tear; SB scleral buckle; PPV pars plana vitrectomy; VH Vitreous hemorrhage; PRP panretinal laser photocoagulation; IVK intravitreal kenalog; VMT vitreomacular traction; MH Macular hole;  NVD neovascularization of the disc; NVE neovascularization elsewhere; AREDS age related eye disease study; ARMD age related macular degeneration; POAG primary open angle glaucoma; EBMD epithelial/anterior basement membrane dystrophy; ACIOL anterior chamber intraocular lens; IOL intraocular lens; PCIOL posterior chamber intraocular lens; Phaco/IOL phacoemulsification with intraocular lens  placement; [place] photorefractive keratectomy; LASIK laser assisted in situ keratomileusis; HTN hypertension; DM diabetes mellitus; COPD chronic obstructive pulmonary disease

## 2022-03-05 DIAGNOSIS — N186 End stage renal disease: Secondary | ICD-10-CM | POA: Diagnosis not present

## 2022-03-05 DIAGNOSIS — Z992 Dependence on renal dialysis: Secondary | ICD-10-CM | POA: Diagnosis not present

## 2022-03-07 DIAGNOSIS — N186 End stage renal disease: Secondary | ICD-10-CM | POA: Diagnosis not present

## 2022-03-07 DIAGNOSIS — Z992 Dependence on renal dialysis: Secondary | ICD-10-CM | POA: Diagnosis not present

## 2022-03-10 DIAGNOSIS — N186 End stage renal disease: Secondary | ICD-10-CM | POA: Diagnosis not present

## 2022-03-10 DIAGNOSIS — Z992 Dependence on renal dialysis: Secondary | ICD-10-CM | POA: Diagnosis not present

## 2022-03-11 ENCOUNTER — Ambulatory Visit (INDEPENDENT_AMBULATORY_CARE_PROVIDER_SITE_OTHER): Payer: 59 | Admitting: Ophthalmology

## 2022-03-11 ENCOUNTER — Encounter (INDEPENDENT_AMBULATORY_CARE_PROVIDER_SITE_OTHER): Payer: Self-pay | Admitting: Ophthalmology

## 2022-03-11 DIAGNOSIS — I10 Essential (primary) hypertension: Secondary | ICD-10-CM

## 2022-03-11 DIAGNOSIS — H35033 Hypertensive retinopathy, bilateral: Secondary | ICD-10-CM | POA: Diagnosis not present

## 2022-03-11 DIAGNOSIS — H4312 Vitreous hemorrhage, left eye: Secondary | ICD-10-CM | POA: Diagnosis not present

## 2022-03-11 DIAGNOSIS — Z961 Presence of intraocular lens: Secondary | ICD-10-CM

## 2022-03-11 DIAGNOSIS — H401132 Primary open-angle glaucoma, bilateral, moderate stage: Secondary | ICD-10-CM | POA: Diagnosis not present

## 2022-03-11 DIAGNOSIS — H353132 Nonexudative age-related macular degeneration, bilateral, intermediate dry stage: Secondary | ICD-10-CM | POA: Diagnosis not present

## 2022-03-11 MED ORDER — BEVACIZUMAB CHEMO INJECTION 1.25MG/0.05ML SYRINGE FOR KALEIDOSCOPE
1.2500 mg | INTRAVITREAL | Status: AC | PRN
  Administered 2022-03-11: 1.25 mg via INTRAVITREAL

## 2022-03-11 NOTE — Progress Notes (Addendum)
[facility] Note  03/11/2022     CHIEF COMPLAINT Patient presents for Retina Follow Up   HISTORY OF PRESENT ILLNESS: Ronald [name] is a 82 y.o. male who presents to the clinic today for:   HPI     Retina Follow Up   Patient presents with  Other.  In left eye.  This started weeks ago.  Duration of 1 week.  I, the attending physician,  performed the HPI with the patient and updated documentation appropriately.        Comments   Patient feels like the vision is not good. He is using Cosopt OD BID and Alphagan OD BID.       Last edited by Bernarda Caffey, MD on 03/11/2022 12:29 PM.     Referring physician: Celene Squibb, MD [place],  [place] [address]  HISTORICAL INFORMATION:   Selected notes from the MEDICAL RECORD NUMBER Referred by Dr. Jorja Loa for [facility] LEE:  Ocular Hx- PMH-    CURRENT MEDICATIONS: Current Outpatient Medications (Ophthalmic Drugs)  Medication Sig   brimonidine (ALPHAGAN) 0.2 % ophthalmic solution Place 1 drop into the right eye 2 (two) times daily.   dorzolamide-timolol (COSOPT) 2-0.5 % ophthalmic solution Place 1 drop into the right eye 2 (two) times daily.   No current facility-administered medications for this visit. (Ophthalmic Drugs)   Current Outpatient Medications (Other)  Medication Sig   atorvastatin (LIPITOR) 80 MG tablet Take 80 mg by mouth in the morning.   carvedilol (COREG) 12.5 MG tablet Take 6.25 mg by mouth 2 (two) times daily with a meal.   magnesium oxide (MAG-OX) 400 MG tablet Take 400 mg by mouth every evening.   memantine (NAMENDA) 5 MG tablet Take 1 tablet (5 mg at night) for 2 weeks, then increase to 1 tablet (5 mg) twice a day   tamsulosin (FLOMAX) 0.4 MG CAPS capsule Take 1 capsule (0.4 mg total) by mouth daily after supper.   traMADol (ULTRAM) 50 MG tablet Take 50 mg by mouth 3 (three) times daily as needed (pain.).   zolpidem (AMBIEN) 10 MG tablet Take 10 mg by mouth at  bedtime.   No current facility-administered medications for this visit. (Other)   REVIEW OF SYSTEMS: ROS   Positive for: Eyes Last edited by Annie Paras, COT on 03/11/2022  9:39 AM.     ALLERGIES No Active Allergies  PAST MEDICAL HISTORY Past Medical History:  Diagnosis Date   GERD (gastroesophageal reflux disease)    Hypertension    Myocardial infarction [facility])    1999   Prostate cancer ([place]) 02/2018   Renal insufficiency    Stroke ([place])    no deficits; had stroke while trying to place "stents in legs".   Past Surgical History:  Procedure Laterality Date   ABDOMINAL AORTIC ANEURYSM REPAIR  2012   IN [place]   APPENDECTOMY     AV FISTULA PLACEMENT Left 10/16/2020   Procedure: LEFT ARM ARTERIOVENOUS (AV) FISTULA CREATION;  Surgeon: Rosetta Posner, MD;  Location: [facility];  Service: Vascular;  Laterality: Left;   CORONARY ARTERY BYPASS GRAFT  01/1998   3 vessels 18 years ago   [place] Right 09/20/2015   Procedure: REPAIR RIGHT INGUINAL HERNIA  WITH MESH;  Surgeon: Vickie Epley, MD;  Location: [facility];  Service: General;  Laterality: Right;   INSERTION OF DIALYSIS CATHETER Right 08/02/2020   Procedure: INSERTION OF RIGHT TUNNELED INTERNAL  JUGULAR  DIALYSIS CATHETER;  Surgeon: Rosetta Posner, MD;  Location: [facility];  Service: Vascular;  Laterality: Right;   REMOVAL OF A DIALYSIS CATHETER N/A 03/19/2021   Procedure: MINOR REMOVAL OF A TUNNELED DIALYSIS CATHETER;  Surgeon: Rosetta Posner, MD;  Location: [facility];  Service: Vascular;  Laterality: N/A;   tumor removal from bladder     FAMILY HISTORY Family History  Problem Relation Age of Onset   Cancer Father    SOCIAL HISTORY Social History   Tobacco Use   Smoking status: Former    Packs/day: 2.00    Years: 50.00    Total pack years: 100.00    Types: Cigarettes    Start date: 08/07/1957   Smokeless tobacco: Never   Tobacco comments:    on chantix now  Vaping Use   Vaping Use: Never used   Substance Use Topics   Alcohol use: No   Drug use: No       OPHTHALMIC EXAM:  Base Eye Exam     Visual Acuity (Snellen - Linear)       Right Left   Dist [place] 20/30 20/300 +1   Dist ph [place] NI NI    Correction: Glasses         Tonometry (Tonopen, 9:43 AM)       Right Left   Pressure 11 12         Pupils       Dark Light Shape React APD   Right 3 2 Round Sluggish None   Left 2 1 Round Sluggish None         Visual Fields       Left Right    Full Full         Extraocular Movement       Right Left    Full, Ortho Full, Ortho         Neuro/Psych     Oriented x3: Yes         Dilation     Both eyes: 1.0% Mydriacyl, 2.5% Phenylephrine @ 9:41 AM           Slit Lamp and Fundus Exam     Slit Lamp Exam       Right Left   Lids/Lashes Dermatochalasis - upper lid, mild MGD Dermatochalasis - upper lid, mild MGD, Meibomian gland dysfunction   Conjunctiva/Sclera White and quiet White and quiet   Cornea 2+ inferior Punctate epithelial erosions, mild arcus, well healed cataract wound 2+ inferior Punctate epithelial erosions, mild arcus, well healed cataract wound, Debris in tear film   Anterior Chamber deep and clear deep and clear   Iris Round and dilated Round and dilated   Lens PC IOL in good position PC IOL in good position, 1+ Posterior capsular opacification   Anterior Vitreous mild syneresis +RBC and Vitreous syneresis in anterior vit, blood stained vitreous condensations improved peripherally, but more concentrated centrally/posteriorly         Fundus Exam       Right Left   Disc +Pallor, +cupping, very thin rims Interval increase in heme over disc   C/D Ratio 0.85    Macula Flat, Blunted foveal reflex, Drusen, central PED, mild ERM, focal blot heme SN mac obscured by heme, grossly flat, super, nasal, temporal macula attached   Vessels mild attenuation, mild tortuosity, mild copper wiring mild attenuation, mild tortuosity, mild copper wiring    Periphery Attached Attached, Scattered pre-retinal heme greatest inferiorly,  IMAGING AND PROCEDURES  Imaging and Procedures for 03/11/2022  OCT, Retina - OU - Both Eyes       Right Eye Quality was good. Central Foveal Thickness: 256. Progression has been stable. Findings include normal foveal contour, no IRF, no SRF, retinal drusen , pigment epithelial detachment.   Left Eye Quality was poor. Progression has no prior data. Findings include (No images obtained).   Notes *Images captured and stored on drive  Diagnosis / Impression:  OD: non-exu ARMD with central drusen and PED OS: no images obtained  Clinical management:  See below  Abbreviations: NFP - Normal foveal profile. CME - cystoid macular edema. PED - pigment epithelial detachment. IRF - intraretinal fluid. SRF - subretinal fluid. EZ - ellipsoid zone. ERM - epiretinal membrane. ORA - outer retinal atrophy. ORT - outer retinal tubulation. SRHM - subretinal hyper-reflective material. IRHM - intraretinal hyper-reflective material      Intravitreal Injection, Pharmacologic Agent - OS - Left Eye       Time Out 03/11/2022. 11:09 AM. Confirmed correct patient, procedure, site, and patient consented.   Anesthesia Topical anesthesia was used. Anesthetic medications included Lidocaine 2%, Proparacaine 0.5%.   Procedure Preparation included 5% betadine to ocular surface, eyelid speculum.   Injection: 1.25 mg Bevacizumab 1.44m/0.05ml   Route: Intravitreal, Site: Left Eye   NDC:: 75643-329-51 Lot:: [id number] Expiration date: 04/11/2022   Post-op Post injection exam found visual acuity of at least counting fingers. The patient tolerated the procedure well. There were no complications. The patient received written and verbal post procedure care education.            ASSESSMENT/PLAN:    ICD-10-CM   1. Vitreous hemorrhage of left eye (HCC)  H43.12 OCT, Retina - OU - Both Eyes    Intravitreal Injection,  Pharmacologic Agent - OS - Left Eye    Bevacizumab (AVASTIN) SOLN 1.25 mg    2. Intermediate stage nonexudative age-related macular degeneration of both eyes  H35.3132 OCT, Retina - OU - Both Eyes    3. Essential hypertension  I10     4. Hypertensive retinopathy of both eyes  H35.033     5. Pseudophakia, both eyes  Z96.1     6. Primary open angle glaucoma (POAG) of both eyes, moderate stage  H40.1132      Vitreous hemorrhage OS  - pt with poor memory, but reports acute floaters, onset ~02/25/22 - unclear etiology but risk factors include -- hemorrhagic PVD, htn retinopathy, ESRD on dialysis -- gets heparin during dialysis  - BCVA 20/300 decrease from 20/150 OS  - pt reports difficulty with compliance of keeping head elevated and still and sleeping with head elevated  - exam shows blood stained vit condensations worse centrally / posteriorly, improved peripherally  - discussed findings, prognosis, treatment options  - recommend anti-VEGF therapy, IVA OS today, 01.30.24  - RBA of procedure discussed, questions answered - IVA informed consent obtained and signed (01.30.24) - see procedure note  - VH precautions reviewed -- minimize activities, keep head elevated, avoid ASA/NSAIDs/blood thinners as able - f/u 1 week, DFE, OCT  2. Age related macular degeneration, non-exudative, both eyes - The incidence, anatomy, and pathology of dry AMD, risk of progression, and the AREDS and AREDS 2 study including smoking risks discussed with patient.  - Recommend amsler grid monitoring  - f/u 3 months  3,4. Hypertensive retinopathy OU - discussed importance of tight BP control - continue to monitor  5. Pseudophakia OU  - s/p CE/IOL  -  IOL in good position, doing well  - continue to monitor  6. Glaucoma  - IOP today: 11,12  - disc OD with pallor and cupping; c/d 0.85  - cont Cosopt and Brimonidine BID OD only  Ophthalmic Meds Ordered this visit:  Meds ordered this encounter  Medications    Bevacizumab (AVASTIN) SOLN 1.25 mg     Return in about 1 week (around 03/18/2022) for f/u VH OS , DFE, OCT.  There are no Patient Instructions on file for this visit.   Explained the diagnoses, plan, and follow up with the patient and they expressed understanding.  Patient expressed understanding of the importance of proper follow up care.  This document serves as a record of services personally performed by Gardiner Sleeper, MD, PhD. It was created on their behalf by Renaldo Reel, [place] an ophthalmic technician. The creation of this record is the provider's dictation and/or activities during the visit.    Electronically signed by:  Renaldo Reel, COT  01.30.24 1:09 PM  Gardiner Sleeper, M.D., Ph.D. Diseases & Surgery of the Retina and Vitreous [facility]  I have reviewed the above documentation for accuracy and completeness, and I agree with the above. Gardiner Sleeper, M.D., Ph.D. 03/11/22 1:09 PM   Abbreviations: M myopia (nearsighted); A astigmatism; H hyperopia (farsighted); P presbyopia; Mrx spectacle prescription;  CTL contact lenses; OD right eye; OS left eye; OU both eyes  XT exotropia; ET esotropia; PEK punctate epithelial keratitis; PEE punctate epithelial erosions; DES dry eye syndrome; MGD meibomian gland dysfunction; ATs artificial tears; PFAT's preservative free artificial tears; [place] nuclear sclerotic cataract; PSC posterior subcapsular cataract; ERM epi-retinal membrane; PVD posterior vitreous detachment; RD retinal detachment; DM diabetes mellitus; DR diabetic retinopathy; NPDR non-proliferative diabetic retinopathy; PDR proliferative diabetic retinopathy; CSME clinically significant macular edema; DME diabetic macular edema; dbh dot blot hemorrhages; CWS cotton wool spot; POAG primary open angle glaucoma; C/D cup-to-disc ratio; HVF humphrey visual field; GVF goldmann visual field; OCT optical coherence tomography; IOP intraocular pressure; BRVO  Branch retinal vein occlusion; CRVO central retinal vein occlusion; CRAO central retinal artery occlusion; BRAO branch retinal artery occlusion; RT retinal tear; SB scleral buckle; PPV pars plana vitrectomy; VH Vitreous hemorrhage; PRP panretinal laser photocoagulation; IVK intravitreal kenalog; VMT vitreomacular traction; MH Macular hole;  NVD neovascularization of the disc; NVE neovascularization elsewhere; AREDS age related eye disease study; ARMD age related macular degeneration; POAG primary open angle glaucoma; EBMD epithelial/anterior basement membrane dystrophy; ACIOL anterior chamber intraocular lens; IOL intraocular lens; PCIOL posterior chamber intraocular lens; Phaco/IOL phacoemulsification with intraocular lens placement; [place] photorefractive keratectomy; LASIK laser assisted in situ keratomileusis; HTN hypertension; DM diabetes mellitus; COPD chronic obstructive pulmonary disease

## 2022-03-12 DIAGNOSIS — N186 End stage renal disease: Secondary | ICD-10-CM | POA: Diagnosis not present

## 2022-03-12 DIAGNOSIS — Z992 Dependence on renal dialysis: Secondary | ICD-10-CM | POA: Diagnosis not present

## 2022-03-13 NOTE — Progress Notes (Signed)
[facility] Note  03/27/2022     CHIEF COMPLAINT Patient presents for Retina Follow Up   HISTORY OF PRESENT ILLNESS: Ronald [name] is a 82 y.o. male who presents to the clinic today for:   HPI     Retina Follow Up   Patient presents with  Other.  In left eye.  This started 2 weeks ago.  I, the attending physician,  performed the HPI with the patient and updated documentation appropriately.        Comments   Patient here for 2 weeks retina follow up for VH OS. Patient states vision hopefully good. No eye pain. Using drops.      Last edited by Bernarda Caffey, MD on 03/27/2022 12:29 PM.    Pt states he still cannot see out of his left eye, his friend states he is doing better keeping his head elevated  Referring physician: Celene Squibb, MD [place],  [place] [address]  HISTORICAL INFORMATION:   Selected notes from the MEDICAL RECORD NUMBER Referred by Dr. Jorja Loa for [facility] LEE:  Ocular Hx- PMH-    CURRENT MEDICATIONS: Current Outpatient Medications (Ophthalmic Drugs)  Medication Sig   brimonidine (ALPHAGAN) 0.2 % ophthalmic solution Place 1 drop into the right eye 2 (two) times daily.   dorzolamide-timolol (COSOPT) 2-0.5 % ophthalmic solution Place 1 drop into the right eye 2 (two) times daily.   No current facility-administered medications for this visit. (Ophthalmic Drugs)   Current Outpatient Medications (Other)  Medication Sig   atorvastatin (LIPITOR) 80 MG tablet Take 80 mg by mouth in the morning.   carvedilol (COREG) 12.5 MG tablet Take 6.25 mg by mouth 2 (two) times daily with a meal.   magnesium oxide (MAG-OX) 400 MG tablet Take 400 mg by mouth every evening.   memantine (NAMENDA) 5 MG tablet Take 1 tablet (5 mg at night) for 2 weeks, then increase to 1 tablet (5 mg) twice a day   tamsulosin (FLOMAX) 0.4 MG CAPS capsule Take 1 capsule (0.4 mg total) by mouth daily after supper.   traMADol (ULTRAM) 50 MG tablet  Take 50 mg by mouth 3 (three) times daily as needed (pain.).   zolpidem (AMBIEN) 10 MG tablet Take 10 mg by mouth at bedtime.   No current facility-administered medications for this visit. (Other)   REVIEW OF SYSTEMS: ROS   Positive for: Eyes Last edited by Theodore Demark, COA on 03/27/2022  9:54 AM.     ALLERGIES No Known Allergies  PAST MEDICAL HISTORY Past Medical History:  Diagnosis Date   GERD (gastroesophageal reflux disease)    Glaucoma    Hypertension    Macular degeneration    Myocardial infarction [facility])    1999   Prostate cancer ([place]) 02/2018   Renal insufficiency    Stroke ([place])    no deficits; had stroke while trying to place "stents in legs".   Past Surgical History:  Procedure Laterality Date   ABDOMINAL AORTIC ANEURYSM REPAIR  2012   IN [place]   APPENDECTOMY     AV FISTULA PLACEMENT Left 10/16/2020   Procedure: LEFT ARM ARTERIOVENOUS (AV) FISTULA CREATION;  Surgeon: Rosetta Posner, MD;  Location: [facility];  Service: Vascular;  Laterality: Left;   CORONARY ARTERY BYPASS GRAFT  01/1998   3 vessels 18 years ago   [place] Right 09/20/2015   Procedure: REPAIR RIGHT INGUINAL HERNIA  WITH MESH;  Surgeon: Corene Cornea  Yolanda Bonine, MD;  Location: [facility];  Service: General;  Laterality: Right;   INSERTION OF DIALYSIS CATHETER Right 08/02/2020   Procedure: INSERTION OF RIGHT TUNNELED INTERNAL JUGULAR  DIALYSIS CATHETER;  Surgeon: Rosetta Posner, MD;  Location: [facility];  Service: Vascular;  Laterality: Right;   REMOVAL OF A DIALYSIS CATHETER N/A 03/19/2021   Procedure: MINOR REMOVAL OF A TUNNELED DIALYSIS CATHETER;  Surgeon: Rosetta Posner, MD;  Location: [facility];  Service: Vascular;  Laterality: N/A;   tumor removal from bladder     FAMILY HISTORY Family History  Problem Relation Age of Onset   Cancer Father    SOCIAL HISTORY Social History   Tobacco Use   Smoking status: Former    Packs/day: 0.50    Years: 50.00    Total pack years: 25.00    Types:  Cigarettes    Start date: 08/07/1957   Smokeless tobacco: Never   Tobacco comments:    on chantix now  Vaping Use   Vaping Use: Never used  Substance Use Topics   Alcohol use: No   Drug use: No       OPHTHALMIC EXAM:  Base Eye Exam     Visual Acuity (Snellen - Linear)       Right Left   Dist [place] 20/30 20/250   Dist ph [place]  20/200         Tonometry (Tonopen, 9:51 AM)       Right Left   Pressure 09 09         Pupils       Dark Light Shape React APD   Right 3 2 Round Brisk None   Left 2 1 Round Brisk None         Visual Fields (Counting fingers)       Left Right    Full Full         Extraocular Movement       Right Left    Full, Ortho Full, Ortho         Neuro/Psych     Oriented x3: Yes   Mood/Affect: Normal         Dilation     Both eyes: 1.0% Mydriacyl, 2.5% Phenylephrine @ 9:51 AM           Slit Lamp and Fundus Exam     Slit Lamp Exam       Right Left   Lids/Lashes Dermatochalasis - upper lid, mild MGD Dermatochalasis - upper lid, mild MGD, Meibomian gland dysfunction   Conjunctiva/Sclera White and quiet White and quiet   Cornea 2+ inferior Punctate epithelial erosions, mild arcus, well healed cataract wound 2+ inferior Punctate epithelial erosions, mild arcus, well healed cataract wound, tear film debris   Anterior Chamber deep and clear deep and clear   Iris Round and dilated Round and dilated   Lens PC IOL in good position PC IOL in good position, 1+ Posterior capsular opacification   Anterior Vitreous mild syneresis +RBC and Vitreous syneresis in anterior vit, blood stained vitreous condensations improved -- still fairly dense centrally         Fundus Exam       Right Left   Disc +Pallor, +cupping, very thin rims hazy view; perfused; no details visible   C/D Ratio 0.85    Macula Flat, Blunted foveal reflex, Drusen, central PED, mild ERM, focal blot heme SN mac hazy view, grossly flat, Drusen   Vessels mild attenuation,  mild tortuosity, mild copper  wiring attenuated, Tortuous   Periphery Attached Hazy view; superior, nasal and temporal periphery visible and grossly attached; inferior periphery obscured by [facility]           IMAGING AND PROCEDURES  Imaging and Procedures for 03/27/2022  OCT, Retina - OU - Both Eyes       Right Eye Quality was good. Central Foveal Thickness: 252. Progression has been stable. Findings include normal foveal contour, no IRF, no SRF, retinal drusen , pigment epithelial detachment.   Left Eye Quality was poor. Central Foveal Thickness: 286. Progression has improved. Findings include normal foveal contour, no IRF, no SRF, retinal drusen , pigment epithelial detachment (Interval improvement vitreous opacities).   Notes *Images captured and stored on drive  Diagnosis / Impression:  non-exu ARMD with central drusen and PED OU OS: interval improvement in vitreous opacities  Clinical management:  See below  Abbreviations: NFP - Normal foveal profile. CME - cystoid macular edema. PED - pigment epithelial detachment. IRF - intraretinal fluid. SRF - subretinal fluid. EZ - ellipsoid zone. ERM - epiretinal membrane. ORA - outer retinal atrophy. ORT - outer retinal tubulation. SRHM - subretinal hyper-reflective material. IRHM - intraretinal hyper-reflective material            ASSESSMENT/PLAN:    ICD-10-CM   1. Vitreous hemorrhage of left eye (HCC)  H43.12 OCT, Retina - OU - Both Eyes    2. Intermediate stage nonexudative age-related macular degeneration of both eyes  H35.3132 OCT, Retina - OU - Both Eyes    3. Essential hypertension  I10     4. Hypertensive retinopathy of both eyes  H35.033     5. Pseudophakia, both eyes  Z96.1     6. Primary open angle glaucoma (POAG) of both eyes, moderate stage  H40.1132      Vitreous hemorrhage OS  - pt with poor memory, but reports acute floaters, onset ~02/25/22 - unclear etiology but risk factors include -- hemorrhagic PVD, htn  retinopathy, ESRD on dialysis -- gets heparin during dialysis  - s/p IVA OS (01.30.24)  - pt and friend report improved compliance with VH precautions  - BCVA 20/200 improved from 20/300 OS  - exam shows blood stained vit condensations improving further - IVA informed consent obtained and signed (01.30.24) - VH precautions reviewed -- minimize activities, keep head elevated, avoid ASA/NSAIDs/blood thinners as able - f/u February 27 or later, DFE, OCT, possible injxn  2. Age related macular degeneration, non-exudative, both eyes - The incidence, anatomy, and pathology of dry AMD, risk of progression, and the AREDS and AREDS 2 study including smoking risks discussed with patient.  - Recommend amsler grid monitoring  - f/u 3 months  3,4. Hypertensive retinopathy OU - discussed importance of tight BP control - continue to monitor  5. Pseudophakia OU  - s/p CE/IOL  - IOL in good position, doing well  - continue to monitor  6. Glaucoma  - IOP today: 09 OU  - disc OD with pallor and cupping; c/d 0.85  - cont Cosopt and Brimonidine BID OD only  Ophthalmic Meds Ordered this visit:  No orders of the defined types were placed in this encounter.    Return for f/u Feb. 27 or later, VH OS, DFE, OCT.  There are no Patient Instructions on file for this visit.   Explained the diagnoses, plan, and follow up with the patient and they expressed understanding.  Patient expressed understanding of the importance of proper follow up care.   This  document serves as a record of services personally performed by Gardiner Sleeper, MD, PhD. It was created on their behalf by San Jetty. Owens Shark, OA an ophthalmic technician. The creation of this record is the provider's dictation and/or activities during the visit.    Electronically signed by: San Jetty. Owens Shark, [place] 02.01.2024 12:32 PM  Gardiner Sleeper, M.D., Ph.D. Diseases & Surgery of the Retina and Vitreous [facility]  I have  reviewed the above documentation for accuracy and completeness, and I agree with the above. Gardiner Sleeper, M.D., Ph.D. 03/27/22 12:35 PM   Abbreviations: M myopia (nearsighted); A astigmatism; H hyperopia (farsighted); P presbyopia; Mrx spectacle prescription;  CTL contact lenses; OD right eye; OS left eye; OU both eyes  XT exotropia; ET esotropia; PEK punctate epithelial keratitis; PEE punctate epithelial erosions; DES dry eye syndrome; MGD meibomian gland dysfunction; ATs artificial tears; PFAT's preservative free artificial tears; [place] nuclear sclerotic cataract; PSC posterior subcapsular cataract; ERM epi-retinal membrane; PVD posterior vitreous detachment; RD retinal detachment; DM diabetes mellitus; DR diabetic retinopathy; NPDR non-proliferative diabetic retinopathy; PDR proliferative diabetic retinopathy; CSME clinically significant macular edema; DME diabetic macular edema; dbh dot blot hemorrhages; CWS cotton wool spot; POAG primary open angle glaucoma; C/D cup-to-disc ratio; HVF humphrey visual field; GVF goldmann visual field; OCT optical coherence tomography; IOP intraocular pressure; BRVO Branch retinal vein occlusion; CRVO central retinal vein occlusion; CRAO central retinal artery occlusion; BRAO branch retinal artery occlusion; RT retinal tear; SB scleral buckle; PPV pars plana vitrectomy; VH Vitreous hemorrhage; PRP panretinal laser photocoagulation; IVK intravitreal kenalog; VMT vitreomacular traction; MH Macular hole;  NVD neovascularization of the disc; NVE neovascularization elsewhere; AREDS age related eye disease study; ARMD age related macular degeneration; POAG primary open angle glaucoma; EBMD epithelial/anterior basement membrane dystrophy; ACIOL anterior chamber intraocular lens; IOL intraocular lens; PCIOL posterior chamber intraocular lens; Phaco/IOL phacoemulsification with intraocular lens placement; [place] photorefractive keratectomy; LASIK laser assisted in situ keratomileusis;  HTN hypertension; DM diabetes mellitus; COPD chronic obstructive pulmonary disease

## 2022-03-14 DIAGNOSIS — Z992 Dependence on renal dialysis: Secondary | ICD-10-CM | POA: Diagnosis not present

## 2022-03-14 DIAGNOSIS — N186 End stage renal disease: Secondary | ICD-10-CM | POA: Diagnosis not present

## 2022-03-17 DIAGNOSIS — Z992 Dependence on renal dialysis: Secondary | ICD-10-CM | POA: Diagnosis not present

## 2022-03-17 DIAGNOSIS — N186 End stage renal disease: Secondary | ICD-10-CM | POA: Diagnosis not present

## 2022-03-19 DIAGNOSIS — Z992 Dependence on renal dialysis: Secondary | ICD-10-CM | POA: Diagnosis not present

## 2022-03-19 DIAGNOSIS — N186 End stage renal disease: Secondary | ICD-10-CM | POA: Diagnosis not present

## 2022-03-21 DIAGNOSIS — N186 End stage renal disease: Secondary | ICD-10-CM | POA: Diagnosis not present

## 2022-03-21 DIAGNOSIS — Z992 Dependence on renal dialysis: Secondary | ICD-10-CM | POA: Diagnosis not present

## 2022-03-24 DIAGNOSIS — Z992 Dependence on renal dialysis: Secondary | ICD-10-CM | POA: Diagnosis not present

## 2022-03-24 DIAGNOSIS — N186 End stage renal disease: Secondary | ICD-10-CM | POA: Diagnosis not present

## 2022-03-25 ENCOUNTER — Ambulatory Visit (HOSPITAL_COMMUNITY)
Admission: RE | Admit: 2022-03-25 | Discharge: 2022-03-25 | Disposition: A | Discharge status: Home / Self Care | Payer: 59 | Source: Ambulatory Visit | Attending: Cardiovascular Disease | Admitting: Cardiovascular Disease

## 2022-03-25 ENCOUNTER — Telehealth: Payer: Self-pay | Admitting: Cardiovascular Disease

## 2022-03-25 ENCOUNTER — Other Ambulatory Visit: Payer: Self-pay | Admitting: Cardiovascular Disease

## 2022-03-25 DIAGNOSIS — Z9889 Other specified postprocedural states: Secondary | ICD-10-CM

## 2022-03-25 DIAGNOSIS — I714 Abdominal aortic aneurysm, without rupture, unspecified: Secondary | ICD-10-CM | POA: Diagnosis not present

## 2022-03-25 DIAGNOSIS — I713 Abdominal aortic aneurysm, ruptured, unspecified: Secondary | ICD-10-CM

## 2022-03-25 DIAGNOSIS — I10 Essential (primary) hypertension: Secondary | ICD-10-CM

## 2022-03-25 DIAGNOSIS — N281 Cyst of kidney, acquired: Secondary | ICD-10-CM | POA: Diagnosis not present

## 2022-03-25 DIAGNOSIS — E782 Mixed hyperlipidemia: Secondary | ICD-10-CM

## 2022-03-25 DIAGNOSIS — Z8679 Personal history of other diseases of the circulatory system: Secondary | ICD-10-CM | POA: Diagnosis not present

## 2022-03-25 DIAGNOSIS — Z951 Presence of aortocoronary bypass graft: Secondary | ICD-10-CM

## 2022-03-25 DIAGNOSIS — N186 End stage renal disease: Secondary | ICD-10-CM

## 2022-03-25 LAB — POCT I-STAT CREATININE: Creatinine, Ser: 7.2 mg/dL — ABNORMAL HIGH (ref 0.61–1.24)

## 2022-03-25 MED ORDER — IOHEXOL 350 MG/ML SOLN
100.0000 mL | Freq: Once | INTRAVENOUS | Status: AC | PRN
  Administered 2022-03-25: 100 mL via INTRAVENOUS

## 2022-03-25 NOTE — Telephone Encounter (Signed)
Patient already has appt scheduled for 03/26/22 with Dr Early per Nishan's request. Nothing further to do. Urgent referral cancelled since it was duplicated.

## 2022-03-25 NOTE — Telephone Encounter (Signed)
Nahser, MD received call with Critical results while DOD for CT Angio Abdomen pelvis that Johnsie Cancel, MD ordered 02/06/22. Per Nishan's OV note:  AAA : endovascular repair with endoleak/residual AAA last CT 2019 and for renal abdominal aortic aneurysm measuring at least 7.0 cm partially visualized aortobiiliac stent graft.  Needs to be followed by Dr. Donnetta Hutching   Order placed for urgent referrral to vein and vascular.

## 2022-03-26 ENCOUNTER — Encounter: Payer: Self-pay | Admitting: Vascular Surgery

## 2022-03-26 ENCOUNTER — Ambulatory Visit: Payer: 59 | Admitting: Vascular Surgery

## 2022-03-26 VITALS — BP 173/83 | HR 74 | Ht 74.0 in | Wt 204.8 lb

## 2022-03-26 DIAGNOSIS — I7142 Juxtarenal abdominal aortic aneurysm, without rupture: Secondary | ICD-10-CM

## 2022-03-26 DIAGNOSIS — Z992 Dependence on renal dialysis: Secondary | ICD-10-CM | POA: Diagnosis not present

## 2022-03-26 DIAGNOSIS — N186 End stage renal disease: Secondary | ICD-10-CM | POA: Diagnosis not present

## 2022-03-26 NOTE — Progress Notes (Signed)
[facility] of [place]  Patient name: Ronald [name] MRN: [id number] DOB: 06-19-1940 Sex: male  REASON FOR VISIT: Discuss CT of abdomen and pelvis for follow-up of prior stent graft repair abdominal aortic aneurysm  HPI: Ronald [name] is a 82 y.o. male here today for discussion of recent CT scan to follow-up abdominal aortic aneurysm.  He does have failing health and some difficulty with memory.  He is here today with a neighbor, Donny Pique, who helps with his health care and transportation.  His telephone number [phone].  The patient is end-stage renal disease and has dialysis via a left radiocephalic fistula placed by myself in September 2022.  He has a remote history of stent graft repair of abdominal aortic aneurysm.  Reviewing notes from Dr. Kyla Balzarine initial consultation when he moved to [facility], his stent graft was in 2013.  This was at [facility] in [place].  His aneurysm was 6.4 cm at the time of repair and he had a [employer].  He has not had any recent CT scans of his abdomen and pelvis.  He did have a pelvic CT in 2019  Past Medical History:  Diagnosis Date   GERD (gastroesophageal reflux disease)    Glaucoma    Hypertension    Macular degeneration    Myocardial infarction [facility])    1999   Prostate cancer ([place]) 02/2018   Renal insufficiency    Stroke ([place])    no deficits; had stroke while trying to place "stents in legs".    Family History  Problem Relation Age of Onset   Cancer Father     SOCIAL HISTORY: Social History   Tobacco Use   Smoking status: Former    Packs/day: 0.50    Years: 50.00    Total pack years: 25.00    Types: Cigarettes    Start date: 08/07/1957   Smokeless tobacco: Never   Tobacco comments:    on chantix now  Substance Use Topics   Alcohol use: No    No Known Allergies  Current Outpatient Medications  Medication Sig  Dispense Refill   atorvastatin (LIPITOR) 80 MG tablet Take 80 mg by mouth in the morning.     brimonidine (ALPHAGAN) 0.2 % ophthalmic solution Place 1 drop into the right eye 2 (two) times daily. 10 mL 5   carvedilol (COREG) 12.5 MG tablet Take 6.25 mg by mouth 2 (two) times daily with a meal.     dorzolamide-timolol (COSOPT) 2-0.5 % ophthalmic solution Place 1 drop into the right eye 2 (two) times daily. 10 mL 5   magnesium oxide (MAG-OX) 400 MG tablet Take 400 mg by mouth every evening.     memantine (NAMENDA) 5 MG tablet Take 1 tablet (5 mg at night) for 2 weeks, then increase to 1 tablet (5 mg) twice a day 60 tablet 11   tamsulosin (FLOMAX) 0.4 MG CAPS capsule Take 1 capsule (0.4 mg total) by mouth daily after supper. 90 capsule 3   traMADol (ULTRAM) 50 MG tablet Take 50 mg by mouth 3 (three) times daily as needed (pain.).     zolpidem (AMBIEN) 10 MG tablet Take 10 mg by mouth at bedtime.     No current facility-administered medications for this visit.    REVIEW OF SYSTEMS:  [X]$  denotes positive finding, [ ]$  denotes negative finding Cardiac  Comments:  Chest pain or chest pressure:    Shortness of breath upon exertion:  Short of breath when lying flat:    Irregular heart rhythm:        Vascular    Pain in calf, thigh, or hip brought on by ambulation:    Pain in feet at night that wakes you up from your sleep:     Blood clot in your veins:    Leg swelling:           PHYSICAL EXAM: Vitals:   03/26/22 1317  BP: (!) 173/83  Pulse: 74  SpO2: 96%  Weight: 204 lb 12.8 oz (92.9 kg)  Height: 6' 2"$  (1.88 m)    GENERAL: The patient is a well-nourished male, in no acute distress. The vital signs are documented above. CARDIOVASCULAR: Abdomen soft and nontender.  He has 2-3+ popliteal and 2+ dorsalis pedis pulses bilaterally PULMONARY: There is good air exchange  MUSCULOSKELETAL: There are no major deformities or cyanosis. NEUROLOGIC: No focal weakness or paresthesias are  detected. SKIN: There are no ulcers or rashes noted. PSYCHIATRIC: The patient has a normal affect.  DATA:  CT scan from 03/25/2022 was reviewed with the patient.  This shows a continued expansion of his native aneurysm sac.  This was approximately 7 cm in 2019 and is now 10 cm in diameter.  He does not have an adequate proximal attachment.  He does not have any obvious type I endoleak from a contrast standpoint but his graft is not sealed to the aortic wall and is located inside the mural thrombus.  He has had continued dilatation of his juxtarenal aorta up to 4-1/2 cm.  MEDICAL ISSUES: I discussed this at length with the patient explaining that he is not protected from continued expansion and potential rupture.  I explained that there are no commercially available stent graft for treatment of this.  I have recommended referral to Dr. Vinnie Level and [place].  I explained that he is a Emergency planning/management officer on complex aortic treatment.  We will coordinate referral to Dr. Sammuel Hines for an outpatient visit    Rosetta Posner, MD [facility] Tel [phone]  Note: Portions of this report may have been transcribed using voice recognition software.  Every effort has been made to ensure accuracy; however, inadvertent computerized transcription errors may still be present.

## 2022-03-27 ENCOUNTER — Encounter (INDEPENDENT_AMBULATORY_CARE_PROVIDER_SITE_OTHER): Payer: Self-pay | Admitting: Ophthalmology

## 2022-03-27 ENCOUNTER — Ambulatory Visit (INDEPENDENT_AMBULATORY_CARE_PROVIDER_SITE_OTHER): Payer: 59 | Admitting: Ophthalmology

## 2022-03-27 DIAGNOSIS — H35033 Hypertensive retinopathy, bilateral: Secondary | ICD-10-CM | POA: Diagnosis not present

## 2022-03-27 DIAGNOSIS — Z961 Presence of intraocular lens: Secondary | ICD-10-CM

## 2022-03-27 DIAGNOSIS — H4312 Vitreous hemorrhage, left eye: Secondary | ICD-10-CM

## 2022-03-27 DIAGNOSIS — H401132 Primary open-angle glaucoma, bilateral, moderate stage: Secondary | ICD-10-CM

## 2022-03-27 DIAGNOSIS — I10 Essential (primary) hypertension: Secondary | ICD-10-CM | POA: Diagnosis not present

## 2022-03-27 DIAGNOSIS — H353132 Nonexudative age-related macular degeneration, bilateral, intermediate dry stage: Secondary | ICD-10-CM | POA: Diagnosis not present

## 2022-03-27 NOTE — Progress Notes (Signed)
[facility] Note  04/08/2022     CHIEF COMPLAINT Patient presents for Retina Follow Up   HISTORY OF PRESENT ILLNESS: Ronald [name] is a 82 y.o. male who presents to the clinic today for:   HPI     Retina Follow Up   Patient presents with  Other.  In left eye.  Severity is moderate.  Duration of 2 weeks.  Since onset it is stable.  I, the attending physician,  performed the HPI with the patient and updated documentation appropriately.        Comments   Pt here for 2 wk ret f/u VH OS. Pt states VA about the same, no changes. VA in OS is still very poor.        Last edited by Bernarda Caffey, MD on 04/08/2022 10:41 PM.    Pt states does not feel like vision has improved  Referring physician: Celene Squibb, MD [place],  [place] [address]  HISTORICAL INFORMATION:   Selected notes from the MEDICAL RECORD NUMBER Referred by Dr. Jorja Loa for [facility] LEE:  Ocular Hx- PMH-    CURRENT MEDICATIONS: Current Outpatient Medications (Ophthalmic Drugs)  Medication Sig   brimonidine (ALPHAGAN) 0.2 % ophthalmic solution Place 1 drop into the right eye 2 (two) times daily.   dorzolamide-timolol (COSOPT) 2-0.5 % ophthalmic solution Place 1 drop into the right eye 2 (two) times daily.   No current facility-administered medications for this visit. (Ophthalmic Drugs)   Current Outpatient Medications (Other)  Medication Sig   atorvastatin (LIPITOR) 80 MG tablet Take 80 mg by mouth in the morning.   carvedilol (COREG) 12.5 MG tablet Take 6.25 mg by mouth 2 (two) times daily with a meal.   magnesium oxide (MAG-OX) 400 MG tablet Take 400 mg by mouth every evening.   memantine (NAMENDA) 5 MG tablet Take 1 tablet (5 mg at night) for 2 weeks, then increase to 1 tablet (5 mg) twice a day   tamsulosin (FLOMAX) 0.4 MG CAPS capsule Take 1 capsule (0.4 mg total) by mouth daily after supper.   traMADol (ULTRAM) 50 MG tablet Take 50 mg by mouth 3 (three)  times daily as needed (pain.).   zolpidem (AMBIEN) 10 MG tablet Take 10 mg by mouth at bedtime.   No current facility-administered medications for this visit. (Other)   REVIEW OF SYSTEMS: ROS   Positive for: Cardiovascular, Eyes Negative for: Constitutional, Gastrointestinal, Neurological, Skin, Genitourinary, Musculoskeletal, HENT, Endocrine, Respiratory, Psychiatric, Allergic/Imm, Heme/Lymph Last edited by Kingsley Spittle, COT on 04/08/2022  1:18 PM.      ALLERGIES No Known Allergies  PAST MEDICAL HISTORY Past Medical History:  Diagnosis Date   GERD (gastroesophageal reflux disease)    Glaucoma    Hypertension    Macular degeneration    Myocardial infarction [facility])    1999   Prostate cancer ([place]) 02/2018   Renal insufficiency    Stroke ([place])    no deficits; had stroke while trying to place "stents in legs".   Past Surgical History:  Procedure Laterality Date   ABDOMINAL AORTIC ANEURYSM REPAIR  2012   IN [place]   APPENDECTOMY     AV FISTULA PLACEMENT Left 10/16/2020   Procedure: LEFT ARM ARTERIOVENOUS (AV) FISTULA CREATION;  Surgeon: Rosetta Posner, MD;  Location: [facility];  Service: Vascular;  Laterality: Left;   CORONARY ARTERY BYPASS GRAFT  01/1998   3 vessels 18 years ago   INGUINAL  HERNIA REPAIR Right 09/20/2015   Procedure: REPAIR RIGHT INGUINAL HERNIA  WITH MESH;  Surgeon: Vickie Epley, MD;  Location: [facility];  Service: General;  Laterality: Right;   INSERTION OF DIALYSIS CATHETER Right 08/02/2020   Procedure: INSERTION OF RIGHT TUNNELED INTERNAL JUGULAR  DIALYSIS CATHETER;  Surgeon: Rosetta Posner, MD;  Location: [facility];  Service: Vascular;  Laterality: Right;   REMOVAL OF A DIALYSIS CATHETER N/A 03/19/2021   Procedure: MINOR REMOVAL OF A TUNNELED DIALYSIS CATHETER;  Surgeon: Rosetta Posner, MD;  Location: [facility];  Service: Vascular;  Laterality: N/A;   tumor removal from bladder     FAMILY HISTORY Family History  Problem Relation Age of Onset   Cancer Father     SOCIAL HISTORY Social History   Tobacco Use   Smoking status: Former    Packs/day: 0.50    Years: 50.00    Total pack years: 25.00    Types: Cigarettes    Start date: 08/07/1957   Smokeless tobacco: Never   Tobacco comments:    on chantix now  Vaping Use   Vaping Use: Never used  Substance Use Topics   Alcohol use: No   Drug use: No       OPHTHALMIC EXAM:  Base Eye Exam     Visual Acuity (Snellen - Linear)       Right Left   Dist [place] 20/40 20/200   Dist ph [place] NI 20/150 +2         Tonometry (Tonopen, 1:24 PM)       Right Left   Pressure 10 10         Pupils       Dark Light Shape React APD   Right 3 2 Round Brisk None   Left 2 1 Round Sluggish None         Visual Fields (Counting fingers)       Left Right    Full Full         Extraocular Movement       Right Left    Full, Ortho Full, Ortho         Neuro/Psych     Oriented x3: Yes   Mood/Affect: Normal         Dilation     Both eyes: 1.0% Mydriacyl, 2.5% Phenylephrine @ 1:25 PM           Slit Lamp and Fundus Exam     Slit Lamp Exam       Right Left   Lids/Lashes Dermatochalasis - upper lid, mild MGD Dermatochalasis - upper lid, mild MGD, Meibomian gland dysfunction   Conjunctiva/Sclera White and quiet White and quiet   Cornea 2+ inferior Punctate epithelial erosions, mild arcus, well healed cataract wound 2+ inferior Punctate epithelial erosions, mild arcus, well healed cataract wound, tear film debris   Anterior Chamber deep and clear deep and clear   Iris Round and dilated Round and dilated   Lens PC IOL in good position PC IOL in good position, 1+ Posterior capsular opacification   Anterior Vitreous mild syneresis +RBC and Vitreous syneresis in anterior vit, blood stained vitreous condensations improved -- still fairly dense centrally, inferior blood clots turning white         Fundus Exam       Right Left   Disc +Pallor, +cupping, very thin rim hazy view;  perfused; no details visible, obscured by heme   C/D Ratio 0.85    Macula Flat, Blunted  foveal reflex, Drusen, central PED, mild ERM, focal blot heme SN mac -- improved hazy view, grossly flat, Drusen   Vessels mild attenuation, mild tortuosity, mild copper wiring attenuated, Tortuous   Periphery Attached Hazy view; superior, nasal and temporal periphery visible and grossly attached; inferior periphery obscured by [facility]           IMAGING AND PROCEDURES  Imaging and Procedures for 04/08/2022  OCT, Retina - OU - Both Eyes       Right Eye Quality was good. Central Foveal Thickness: 250. Progression has been stable. Findings include normal foveal contour, no IRF, no SRF, retinal drusen , pigment epithelial detachment.   Left Eye Quality was poor. Central Foveal Thickness: 300. Progression has been stable. Findings include no IRF, no SRF, abnormal foveal contour, retinal drusen , pigment epithelial detachment (Persistent vitreous opacities).   Notes *Images captured and stored on drive  Diagnosis / Impression:  non-exu ARMD with central drusen and PED OU OS: persistent vitreous opacities  Clinical management:  See below  Abbreviations: NFP - Normal foveal profile. CME - cystoid macular edema. PED - pigment epithelial detachment. IRF - intraretinal fluid. SRF - subretinal fluid. EZ - ellipsoid zone. ERM - epiretinal membrane. ORA - outer retinal atrophy. ORT - outer retinal tubulation. SRHM - subretinal hyper-reflective material. IRHM - intraretinal hyper-reflective material      Intravitreal Injection, Pharmacologic Agent - OS - Left Eye       Time Out 04/08/2022. 2:58 PM. Confirmed correct patient, procedure, site, and patient consented.   Anesthesia Topical anesthesia was used. Anesthetic medications included Lidocaine 2%, Proparacaine 0.5%.   Procedure Preparation included 5% betadine to ocular surface, eyelid speculum.   Injection: 1.25 mg Bevacizumab 1.'25mg'$ /0.41m    Route: Intravitreal, Site: Left Eye   NDC: [id number] Expiration date: 06/21/2022   Post-op Post injection exam found visual acuity of at least counting fingers. The patient tolerated the procedure well. There were no complications. The patient received written and verbal post procedure care education.            ASSESSMENT/PLAN:    ICD-10-CM   1. Vitreous hemorrhage of left eye (HCC)  H43.12 OCT, Retina - OU - Both Eyes    Intravitreal Injection, Pharmacologic Agent - OS - Left Eye    Bevacizumab (AVASTIN) SOLN 1.25 mg    2. Intermediate stage nonexudative age-related macular degeneration of both eyes  H35.3132     3. Essential hypertension  I10     4. Hypertensive retinopathy of both eyes  H35.033     5. Pseudophakia, both eyes  Z96.1     6. Primary open angle glaucoma (POAG) of both eyes, moderate stage  H40.1132      Vitreous hemorrhage OS  - pt with poor memory, but reports acute floaters, onset ~02/25/22 - unclear etiology but risk factors include -- hemorrhagic PVD, htn retinopathy, ESRD on dialysis -- gets heparin during dialysis  - s/p IVA OS (01.30.24)  - pt and friend report improved compliance with VH precautions  - BCVA 20/150 improved from 20/200 OS  - exam shows blood stained vit condensations improving slowly  - recommend IVA OS #2 today, 02.27.24  - pt wishes to proceed with injection - IVA informed consent obtained and signed (01.30.24) - VH precautions reviewed -- minimize activities, keep head elevated, avoid ASA/NSAIDs/blood thinners as able - f/u 4 weeks, DFE, OCT, possible injxn  2. Age related macular degeneration, non-exudative, both eyes - The incidence, anatomy, and  pathology of dry AMD, risk of progression, and the AREDS and AREDS 2 study including smoking risks discussed with patient.  - Recommend amsler grid monitoring  - f/u 3 months  3,4. Hypertensive retinopathy OU - discussed importance of tight BP control - continue to  monitor  5. Pseudophakia OU  - s/p CE/IOL  - IOL in good position, doing well  - continue to monitor  6. Glaucoma  - IOP today: 10 OU  - disc OD with pallor and cupping; c/d 0.85  - cont Cosopt and Brimonidine BID OD only  Ophthalmic Meds Ordered this visit:  Meds ordered this encounter  Medications   Bevacizumab (AVASTIN) SOLN 1.25 mg     Return in about 4 weeks (around 05/06/2022) for f/u VH OS, DFE, OCT.  There are no Patient Instructions on file for this visit.   Explained the diagnoses, plan, and follow up with the patient and they expressed understanding.  Patient expressed understanding of the importance of proper follow up care.   This document serves as a record of services personally performed by Gardiner Sleeper, MD, PhD. It was created on their behalf by Renaldo Reel, [place] an ophthalmic technician. The creation of this record is the provider's dictation and/or activities during the visit.    Electronically signed by:  Renaldo Reel, COT  02.15.24 10:41 PM  This document serves as a record of services personally performed by Gardiner Sleeper, MD, PhD. It was created on their behalf by San Jetty. Owens Shark, OA an ophthalmic technician. The creation of this record is the provider's dictation and/or activities during the visit.    Electronically signed by: San Jetty. Owens Shark, [place] 02.27.2024 10:41 PM  Gardiner Sleeper, M.D., Ph.D. Diseases & Surgery of the Retina and Vitreous [facility]  I have reviewed the above documentation for accuracy and completeness, and I agree with the above. Gardiner Sleeper, M.D., Ph.D. 04/08/22 10:43 PM   Abbreviations: M myopia (nearsighted); A astigmatism; H hyperopia (farsighted); P presbyopia; Mrx spectacle prescription;  CTL contact lenses; OD right eye; OS left eye; OU both eyes  XT exotropia; ET esotropia; PEK punctate epithelial keratitis; PEE punctate epithelial erosions; DES dry eye syndrome; MGD meibomian  gland dysfunction; ATs artificial tears; PFAT's preservative free artificial tears; [place] nuclear sclerotic cataract; PSC posterior subcapsular cataract; ERM epi-retinal membrane; PVD posterior vitreous detachment; RD retinal detachment; DM diabetes mellitus; DR diabetic retinopathy; NPDR non-proliferative diabetic retinopathy; PDR proliferative diabetic retinopathy; CSME clinically significant macular edema; DME diabetic macular edema; dbh dot blot hemorrhages; CWS cotton wool spot; POAG primary open angle glaucoma; C/D cup-to-disc ratio; HVF humphrey visual field; GVF goldmann visual field; OCT optical coherence tomography; IOP intraocular pressure; BRVO Branch retinal vein occlusion; CRVO central retinal vein occlusion; CRAO central retinal artery occlusion; BRAO branch retinal artery occlusion; RT retinal tear; SB scleral buckle; PPV pars plana vitrectomy; VH Vitreous hemorrhage; PRP panretinal laser photocoagulation; IVK intravitreal kenalog; VMT vitreomacular traction; MH Macular hole;  NVD neovascularization of the disc; NVE neovascularization elsewhere; AREDS age related eye disease study; ARMD age related macular degeneration; POAG primary open angle glaucoma; EBMD epithelial/anterior basement membrane dystrophy; ACIOL anterior chamber intraocular lens; IOL intraocular lens; PCIOL posterior chamber intraocular lens; Phaco/IOL phacoemulsification with intraocular lens placement; [place] photorefractive keratectomy; LASIK laser assisted in situ keratomileusis; HTN hypertension; DM diabetes mellitus; COPD chronic obstructive pulmonary disease

## 2022-03-28 ENCOUNTER — Other Ambulatory Visit: Payer: Medicare Other

## 2022-03-28 DIAGNOSIS — Z992 Dependence on renal dialysis: Secondary | ICD-10-CM | POA: Diagnosis not present

## 2022-03-28 DIAGNOSIS — N186 End stage renal disease: Secondary | ICD-10-CM | POA: Diagnosis not present

## 2022-03-31 DIAGNOSIS — N186 End stage renal disease: Secondary | ICD-10-CM | POA: Diagnosis not present

## 2022-03-31 DIAGNOSIS — Z992 Dependence on renal dialysis: Secondary | ICD-10-CM | POA: Diagnosis not present

## 2022-04-01 ENCOUNTER — Other Ambulatory Visit: Payer: Self-pay

## 2022-04-01 ENCOUNTER — Other Ambulatory Visit: Payer: Medicare Other

## 2022-04-01 DIAGNOSIS — C61 Malignant neoplasm of prostate: Secondary | ICD-10-CM

## 2022-04-02 ENCOUNTER — Encounter: Payer: Medicare Other | Admitting: Psychology

## 2022-04-02 DIAGNOSIS — N186 End stage renal disease: Secondary | ICD-10-CM | POA: Diagnosis not present

## 2022-04-02 DIAGNOSIS — Z992 Dependence on renal dialysis: Secondary | ICD-10-CM | POA: Diagnosis not present

## 2022-04-02 LAB — PSA: Prostate Specific Ag, Serum: 1.7 ng/mL (ref 0.0–4.0)

## 2022-04-03 DIAGNOSIS — E782 Mixed hyperlipidemia: Secondary | ICD-10-CM | POA: Diagnosis not present

## 2022-04-03 DIAGNOSIS — Z992 Dependence on renal dialysis: Secondary | ICD-10-CM | POA: Diagnosis not present

## 2022-04-03 DIAGNOSIS — N186 End stage renal disease: Secondary | ICD-10-CM | POA: Diagnosis not present

## 2022-04-04 ENCOUNTER — Ambulatory Visit: Payer: Medicare Other | Admitting: Urology

## 2022-04-04 DIAGNOSIS — N186 End stage renal disease: Secondary | ICD-10-CM | POA: Diagnosis not present

## 2022-04-04 DIAGNOSIS — Z992 Dependence on renal dialysis: Secondary | ICD-10-CM | POA: Diagnosis not present

## 2022-04-07 ENCOUNTER — Ambulatory Visit (INDEPENDENT_AMBULATORY_CARE_PROVIDER_SITE_OTHER): Payer: 59 | Admitting: Urology

## 2022-04-07 VITALS — BP 169/69 | HR 74 | Ht 74.0 in | Wt 205.0 lb

## 2022-04-07 DIAGNOSIS — N401 Enlarged prostate with lower urinary tract symptoms: Secondary | ICD-10-CM

## 2022-04-07 DIAGNOSIS — R3912 Poor urinary stream: Secondary | ICD-10-CM | POA: Diagnosis not present

## 2022-04-07 DIAGNOSIS — N138 Other obstructive and reflux uropathy: Secondary | ICD-10-CM | POA: Diagnosis not present

## 2022-04-07 DIAGNOSIS — C61 Malignant neoplasm of prostate: Secondary | ICD-10-CM | POA: Diagnosis not present

## 2022-04-07 MED ORDER — TAMSULOSIN HCL 0.4 MG PO CAPS: 0.4000 mg | ORAL_CAPSULE | Freq: Every day | ORAL | 3 refills | Status: DC

## 2022-04-07 NOTE — Progress Notes (Unsigned)
04/07/2022 11:44 AM   Ronald [name] July 22, 1940 [id number]  Referring provider: Celene Squibb, MD [address] [name],  [place] [address]  No chief complaint on file.   HPI:    PMH: Past Medical History:  Diagnosis Date   GERD (gastroesophageal reflux disease)    Glaucoma    Hypertension    Macular degeneration    Myocardial infarction [facility])    1999   Prostate cancer ([place]) 02/2018   Renal insufficiency    Stroke ([place])    no deficits; had stroke while trying to place "stents in legs".    Surgical History: Past Surgical History:  Procedure Laterality Date   ABDOMINAL AORTIC ANEURYSM REPAIR  2012   IN [place]   APPENDECTOMY     AV FISTULA PLACEMENT Left 10/16/2020   Procedure: LEFT ARM ARTERIOVENOUS (AV) FISTULA CREATION;  Surgeon: Rosetta Posner, MD;  Location: [facility];  Service: Vascular;  Laterality: Left;   CORONARY ARTERY BYPASS GRAFT  01/1998   3 vessels 18 years ago   [place] Right 09/20/2015   Procedure: REPAIR RIGHT INGUINAL HERNIA  WITH MESH;  Surgeon: Vickie Epley, MD;  Location: [facility];  Service: General;  Laterality: Right;   INSERTION OF DIALYSIS CATHETER Right 08/02/2020   Procedure: INSERTION OF RIGHT TUNNELED INTERNAL JUGULAR  DIALYSIS CATHETER;  Surgeon: Rosetta Posner, MD;  Location: [facility];  Service: Vascular;  Laterality: Right;   REMOVAL OF A DIALYSIS CATHETER N/A 03/19/2021   Procedure: MINOR REMOVAL OF A TUNNELED DIALYSIS CATHETER;  Surgeon: Rosetta Posner, MD;  Location: [facility];  Service: Vascular;  Laterality: N/A;   tumor removal from bladder      Home Medications:  Allergies as of 04/07/2022   No Known Allergies      Medication List        Accurate as of April 07, 2022 11:44 AM. If you have any questions, ask your nurse or doctor.          atorvastatin 80 MG tablet Commonly known as: LIPITOR Take 80 mg by mouth in the morning.   brimonidine 0.2 % ophthalmic solution Commonly known as:  ALPHAGAN Place 1 drop into the right eye 2 (two) times daily.   carvedilol 12.5 MG tablet Commonly known as: COREG Take 6.25 mg by mouth 2 (two) times daily with a meal.   dorzolamide-timolol 2-0.5 % ophthalmic solution Commonly known as: COSOPT Place 1 drop into the right eye 2 (two) times daily.   magnesium oxide 400 MG tablet Commonly known as: MAG-OX Take 400 mg by mouth every evening.   memantine 5 MG tablet Commonly known as: NAMENDA Take 1 tablet (5 mg at night) for 2 weeks, then increase to 1 tablet (5 mg) twice a day   tamsulosin 0.4 MG Caps capsule Commonly known as: FLOMAX Take 1 capsule (0.4 mg total) by mouth daily after supper.   traMADol 50 MG tablet Commonly known as: ULTRAM Take 50 mg by mouth 3 (three) times daily as needed (pain.).   zolpidem 10 MG tablet Commonly known as: AMBIEN Take 10 mg by mouth at bedtime.        Allergies: No Known Allergies  Family History: Family History  Problem Relation Age of Onset   Cancer Father     Social History:  reports that he has quit smoking. His smoking use included cigarettes. He started smoking about 64 years ago. He has a 25.00 pack-year smoking history. He  has never used smokeless tobacco. He reports that he does not drink alcohol and does not use drugs.  ROS: All other review of systems were reviewed and are negative except what is noted above in HPI  Physical Exam: BP (!) 169/69   Pulse 74   Ht '6\' 2"'$  (1.88 m)   Wt 205 lb (93 kg)   BMI 26.32 kg/m   Constitutional:  Alert and oriented, No acute distress. HEENT: [place] AT, moist mucus membranes.  Trachea midline, no masses. Cardiovascular: No clubbing, cyanosis, or edema. Respiratory: Normal respiratory effort, no increased work of breathing. GI: Abdomen is soft, nontender, nondistended, no abdominal masses GU: No CVA tenderness.  Lymph: No cervical or inguinal lymphadenopathy. Skin: No rashes, bruises or suspicious lesions. Neurologic: Grossly  intact, no focal deficits, moving all 4 extremities. Psychiatric: Normal mood and affect.  Laboratory Data: Lab Results  Component Value Date   WBC 4.7 08/10/2019   HGB 11.9 (L) 10/16/2020   HCT 35.0 (L) 10/16/2020   MCV 90.4 08/10/2019   PLT 160 08/10/2019    Lab Results  Component Value Date   CREATININE 7.20 (H) 03/25/2022    Lab Results  Component Value Date   PSA <0.1 03/15/2019    Lab Results  Component Value Date   TESTOSTERONE 124 (L) 03/26/2020    No results found for: "HGBA1C"  Urinalysis    Component Value Date/Time   APPEARANCEUR Clear 12/27/2021 1213   GLUCOSEU Trace (A) 12/27/2021 1213   BILIRUBINUR Negative 12/27/2021 1213   PROTEINUR 3+ (A) 12/27/2021 1213   UROBILINOGEN 0.2 05/17/2019 0917   NITRITE Negative 12/27/2021 1213   LEUKOCYTESUR Negative 12/27/2021 1213    Lab Results  Component Value Date   LABMICR See below: 12/27/2021   WBCUA 0-5 12/27/2021   LABEPIT 0-10 12/27/2021   MUCUS Present 06/14/2021   BACTERIA Few (A) 12/27/2021    Pertinent Imaging: *** Results for orders placed during the hospital encounter of 03/29/18  DG Abd 1 View  Narrative CLINICAL DATA:  Constipation for a few weeks.  EXAM: ABDOMEN - 1 VIEW  COMPARISON:  None.  FINDINGS: Normal bowel gas pattern without evidence of obstruction.  Mild generalized increased colonic stool burden.  No evidence of renal or ureteral stones.  Aortic stent graft extends from the lower aspect of L2 to common iliac artery arms over the upper sacrum.  Skeletal structures intact.  IMPRESSION: 1. No acute findings.  No evidence of bowel obstruction. 2. Mild generalized increased colonic stool burden.   Electronically Signed By: Lajean Manes M.D. On: 03/30/2018 08:52  No results found for this or any previous visit.  No results found for this or any previous visit.  No results found for this or any previous visit.  Results for orders placed during the  hospital encounter of 07/14/16  US Renal  Narrative CLINICAL DATA:  Protein urea, chronic renal insufficiency, assess postvoid residual volume  EXAM: RENAL / URINARY TRACT ULTRASOUND COMPLETE  COMPARISON:  Renal ultrasound of December 22, 2014  FINDINGS: Right Kidney:  Length: 12.2 cm. The renal cortical echotexture is somewhat greater than that of the adjacent liver. Multiple cysts of present 2.7 in diameter. There is no hydronephrosis.  Left Kidney:  Length: 11.3 cm. The renal cortical echotexture is similar to that of the right kidney. There are multiple cystic structures measuring up to 1.4 cm in diameter. There is no hydronephrosis.  Bladder:  Urinary bladder exhibits diffuse wall thickening. The prostate gland produces a prominent  impression upon the bladder base. The prevoid volume was 187 cc. The postvoid volume is 28 cc.  IMPRESSION: Multiple simple appearing renal cysts more conspicuous than on the previous study. Increased renal cortical echotexture consistent with medical renal disease. No hydronephrosis.  Thickened gallbladder wall.  28 cc postvoid residual volume.   Electronically Signed By: David  Martinique M.D. On: 07/14/2016 11:40  No valid procedures specified. No results found for this or any previous visit.  No results found for this or any previous visit.   Assessment & Plan:    1. Prostate cancer [facility]) -Patient elects for PSA surveillance. Followup 6 months with PSA - Urinalysis, Routine w reflex microscopic  2. Benign prostatic hyperplasia with urinary obstruction ***  3. Weak urinary stream ***   No follow-ups on file.  Nicolette Bang, MD  [facility] [place]

## 2022-04-08 ENCOUNTER — Encounter (INDEPENDENT_AMBULATORY_CARE_PROVIDER_SITE_OTHER): Payer: Self-pay | Admitting: Ophthalmology

## 2022-04-08 ENCOUNTER — Encounter: Payer: Self-pay | Admitting: Urology

## 2022-04-08 ENCOUNTER — Ambulatory Visit: Payer: Medicare Other | Admitting: Urology

## 2022-04-08 ENCOUNTER — Ambulatory Visit (INDEPENDENT_AMBULATORY_CARE_PROVIDER_SITE_OTHER): Payer: 59 | Admitting: Ophthalmology

## 2022-04-08 DIAGNOSIS — Z961 Presence of intraocular lens: Secondary | ICD-10-CM | POA: Diagnosis not present

## 2022-04-08 DIAGNOSIS — H401132 Primary open-angle glaucoma, bilateral, moderate stage: Secondary | ICD-10-CM

## 2022-04-08 DIAGNOSIS — H4312 Vitreous hemorrhage, left eye: Secondary | ICD-10-CM | POA: Diagnosis not present

## 2022-04-08 DIAGNOSIS — I10 Essential (primary) hypertension: Secondary | ICD-10-CM

## 2022-04-08 DIAGNOSIS — H353132 Nonexudative age-related macular degeneration, bilateral, intermediate dry stage: Secondary | ICD-10-CM

## 2022-04-08 DIAGNOSIS — H35033 Hypertensive retinopathy, bilateral: Secondary | ICD-10-CM

## 2022-04-08 LAB — MICROSCOPIC EXAMINATION: Bacteria, UA: NONE SEEN

## 2022-04-08 LAB — URINALYSIS, ROUTINE W REFLEX MICROSCOPIC
Bilirubin, UA: NEGATIVE
Glucose, UA: NEGATIVE
Ketones, UA: NEGATIVE
Leukocytes,UA: NEGATIVE
Nitrite, UA: NEGATIVE
Specific Gravity, UA: 1.015 (ref 1.005–1.030)
Urobilinogen, Ur: 0.2 mg/dL (ref 0.2–1.0)
pH, UA: 8.5 — ABNORMAL HIGH (ref 5.0–7.5)

## 2022-04-08 MED ORDER — BEVACIZUMAB CHEMO INJECTION 1.25MG/0.05ML SYRINGE FOR KALEIDOSCOPE
1.2500 mg | INTRAVITREAL | Status: AC | PRN
  Administered 2022-04-08: 1.25 mg via INTRAVITREAL

## 2022-04-08 NOTE — Patient Instructions (Signed)

## 2022-04-09 ENCOUNTER — Telehealth: Payer: Self-pay

## 2022-04-09 DIAGNOSIS — D649 Anemia, unspecified: Secondary | ICD-10-CM | POA: Insufficient documentation

## 2022-04-09 DIAGNOSIS — E211 Secondary hyperparathyroidism, not elsewhere classified: Secondary | ICD-10-CM | POA: Diagnosis not present

## 2022-04-09 DIAGNOSIS — L989 Disorder of the skin and subcutaneous tissue, unspecified: Secondary | ICD-10-CM | POA: Diagnosis not present

## 2022-04-09 DIAGNOSIS — Z0001 Encounter for general adult medical examination with abnormal findings: Secondary | ICD-10-CM | POA: Diagnosis not present

## 2022-04-09 DIAGNOSIS — H3530 Unspecified macular degeneration: Secondary | ICD-10-CM | POA: Insufficient documentation

## 2022-04-09 DIAGNOSIS — H409 Unspecified glaucoma: Secondary | ICD-10-CM | POA: Insufficient documentation

## 2022-04-09 DIAGNOSIS — N400 Enlarged prostate without lower urinary tract symptoms: Secondary | ICD-10-CM | POA: Insufficient documentation

## 2022-04-09 DIAGNOSIS — Z992 Dependence on renal dialysis: Secondary | ICD-10-CM | POA: Diagnosis not present

## 2022-04-09 DIAGNOSIS — N186 End stage renal disease: Secondary | ICD-10-CM | POA: Diagnosis not present

## 2022-04-09 NOTE — Telephone Encounter (Signed)
Pt's wife, Marlowe Kays, called requesting an update on the referral to [facility] for AAA. They have not heard anything at this time.  Reviewed pt's chart, no referral in system, no order on blue sheet. Called Dr. Eddie Dibbles office to confirm receipt of any referral via fax, electronic, or phone call. Their office has not received any referral. Urgent referral entered into chart, along with office note and CT results.  Returned call, two identifiers used. Informed her that the referral was sent out today and they should receive a phone call soon. Confirmed understanding.

## 2022-04-09 NOTE — Telephone Encounter (Signed)
[facility]) called [place] to f/u on our call to their office earlier today regarding pt's urgent referral. Lesleigh Noe will f/u with clinic admin and pt's wife to update them on this referral.

## 2022-04-10 DIAGNOSIS — N186 End stage renal disease: Secondary | ICD-10-CM | POA: Diagnosis not present

## 2022-04-10 DIAGNOSIS — Z992 Dependence on renal dialysis: Secondary | ICD-10-CM | POA: Diagnosis not present

## 2022-04-11 ENCOUNTER — Encounter: Payer: Medicare Other | Admitting: Psychology

## 2022-04-11 DIAGNOSIS — Z992 Dependence on renal dialysis: Secondary | ICD-10-CM | POA: Diagnosis not present

## 2022-04-11 DIAGNOSIS — N186 End stage renal disease: Secondary | ICD-10-CM | POA: Diagnosis not present

## 2022-04-14 DIAGNOSIS — N186 End stage renal disease: Secondary | ICD-10-CM | POA: Diagnosis not present

## 2022-04-14 DIAGNOSIS — Z992 Dependence on renal dialysis: Secondary | ICD-10-CM | POA: Diagnosis not present

## 2022-04-17 DIAGNOSIS — I714 Abdominal aortic aneurysm, without rupture, unspecified: Secondary | ICD-10-CM | POA: Diagnosis not present

## 2022-04-18 DIAGNOSIS — N186 End stage renal disease: Secondary | ICD-10-CM | POA: Diagnosis not present

## 2022-04-18 DIAGNOSIS — Z992 Dependence on renal dialysis: Secondary | ICD-10-CM | POA: Diagnosis not present

## 2022-04-21 DIAGNOSIS — Z992 Dependence on renal dialysis: Secondary | ICD-10-CM | POA: Diagnosis not present

## 2022-04-21 DIAGNOSIS — N186 End stage renal disease: Secondary | ICD-10-CM | POA: Diagnosis not present

## 2022-04-23 DIAGNOSIS — N186 End stage renal disease: Secondary | ICD-10-CM | POA: Diagnosis not present

## 2022-04-23 DIAGNOSIS — Z992 Dependence on renal dialysis: Secondary | ICD-10-CM | POA: Diagnosis not present

## 2022-04-23 NOTE — Progress Notes (Signed)
[facility] Note  05/06/2022     CHIEF COMPLAINT Patient presents for Retina Follow Up   HISTORY OF PRESENT ILLNESS: Ronald [name] is a 82 y.o. male who presents to the clinic today for:   HPI     Retina Follow Up   Patient presents with  Other.  In left eye.  Severity is moderate.  Duration of 4 weeks.  Since onset it is gradually worsening.  I, the attending physician,  performed the HPI with the patient and updated documentation appropriately.        Comments   Pt here for 4 wk ret f/u [facility] OS. Pt states he feels VA is not as good as it was @ previous visit.       Last edited by Bernarda Caffey, MD on 05/06/2022  5:07 PM.     Pt states   Referring physician: Celene Squibb, [place],  [place] [address]  HISTORICAL INFORMATION:   Selected notes from the MEDICAL RECORD NUMBER Referred by Dr. Jorja Loa for [facility]:  Ocular Hx- PMH-    CURRENT MEDICATIONS: Current Outpatient Medications (Ophthalmic Drugs)  Medication Sig   brimonidine (ALPHAGAN) 0.2 % ophthalmic solution Place 1 drop into the right eye 2 (two) times daily.   dorzolamide-timolol (COSOPT) 2-0.5 % ophthalmic solution Place 1 drop into the right eye 2 (two) times daily.   No current facility-administered medications for this visit. (Ophthalmic Drugs)   Current Outpatient Medications (Other)  Medication Sig   atorvastatin (LIPITOR) 80 MG tablet Take 80 mg by mouth in the morning.   carvedilol (COREG) 12.5 MG tablet Take 6.25 mg by mouth 2 (two) times daily with a meal.   magnesium oxide (MAG-OX) 400 MG tablet Take 400 mg by mouth every evening.   memantine (NAMENDA) 5 MG tablet Take 1 tablet (5 mg at night) for 2 weeks, then increase to 1 tablet (5 mg) twice a day   tamsulosin (FLOMAX) 0.4 MG CAPS capsule Take 1 capsule (0.4 mg total) by mouth daily after supper.   traMADol (ULTRAM) 50 MG tablet Take 50 mg by mouth 3 (three) times daily as needed (pain.).    zolpidem (AMBIEN) 10 MG tablet Take 10 mg by mouth at bedtime.   No current facility-administered medications for this visit. (Other)   REVIEW OF SYSTEMS: ROS   Positive for: Cardiovascular, Eyes Negative for: Constitutional, Gastrointestinal, Neurological, Skin, Genitourinary, Musculoskeletal, HENT, Endocrine, Respiratory, Psychiatric, Allergic/Imm, Heme/Lymph Last edited by Kingsley Spittle, COT on 05/06/2022  1:23 PM.       ALLERGIES No Known Allergies  PAST MEDICAL HISTORY Past Medical History:  Diagnosis Date   GERD (gastroesophageal reflux disease)    Glaucoma    Hypertension    Macular degeneration    Myocardial infarction [facility])    1999   Prostate cancer ([place]) 02/2018   Renal insufficiency    Stroke ([place])    no deficits; had stroke while trying to place "stents in legs".   Past Surgical History:  Procedure Laterality Date   ABDOMINAL AORTIC ANEURYSM REPAIR  2012   IN [place]   APPENDECTOMY     AV FISTULA PLACEMENT Left 10/16/2020   Procedure: LEFT ARM ARTERIOVENOUS (AV) FISTULA CREATION;  Surgeon: Rosetta Posner, MD;  Location: [facility];  Service: Vascular;  Laterality: Left;   CORONARY ARTERY BYPASS GRAFT  01/1998   3 vessels 18 years ago   [place] Right  09/20/2015   Procedure: REPAIR RIGHT INGUINAL HERNIA  WITH MESH;  Surgeon: Vickie Epley, MD;  Location: [facility];  Service: General;  Laterality: Right;   INSERTION OF DIALYSIS CATHETER Right 08/02/2020   Procedure: INSERTION OF RIGHT TUNNELED INTERNAL JUGULAR  DIALYSIS CATHETER;  Surgeon: Rosetta Posner, MD;  Location: [facility];  Service: Vascular;  Laterality: Right;   REMOVAL OF A DIALYSIS CATHETER N/A 03/19/2021   Procedure: MINOR REMOVAL OF A TUNNELED DIALYSIS CATHETER;  Surgeon: Rosetta Posner, MD;  Location: [facility];  Service: Vascular;  Laterality: N/A;   tumor removal from bladder     FAMILY HISTORY Family History  Problem Relation Age of Onset   Cancer Father    SOCIAL HISTORY Social  History   Tobacco Use   Smoking status: Former    Packs/day: 0.50    Years: 50.00    Additional pack years: 0.00    Total pack years: 25.00    Types: Cigarettes    Start date: 08/07/1957   Smokeless tobacco: Never   Tobacco comments:    on chantix now  Vaping Use   Vaping Use: Never used  Substance Use Topics   Alcohol use: No   Drug use: No       OPHTHALMIC EXAM:  Base Eye Exam     Visual Acuity (Snellen - Linear)       Right Left   Dist [place] 20/60 20/200   Dist ph [place] NI NI         Tonometry (Tonopen, 1:33 PM)       Right Left   Pressure 11 9         Pupils       Dark Light Shape React APD   Right 3 2 Round Brisk None   Left 2 1 Round Sluggish None         Visual Fields (Counting fingers)       Left Right    Full Full         Extraocular Movement       Right Left    Full, Ortho Full, Ortho         Neuro/Psych     Oriented x3: Yes   Mood/Affect: Normal         Dilation     Both eyes: 1.0% Mydriacyl, 2.5% Phenylephrine @ 1:33 PM           Slit Lamp and Fundus Exam     Slit Lamp Exam       Right Left   Lids/Lashes Dermatochalasis - upper lid, mild MGD Dermatochalasis - upper lid, mild MGD, Meibomian gland dysfunction   Conjunctiva/Sclera White and quiet White and quiet   Cornea 2+ inferior Punctate epithelial erosions, mild arcus, well healed cataract wound 2+ inferior Punctate epithelial erosions, mild arcus, well healed cataract wound, tear film debris   Anterior Chamber deep and clear deep and clear   Iris Round and dilated Round and dilated   Lens PC IOL in good position PC IOL in good position, 1+ Posterior capsular opacification   Anterior Vitreous mild syneresis +RBC and Vitreous syneresis in anterior vit, blood stained vitreous condensations clearing and turning white         Fundus Exam       Right Left   Disc +Pallor, +cupping, very thin rim obscured by red heme   C/D Ratio 0.85    Macula Flat, Blunted  foveal reflex, Drusen, central PED, mild ERM, focal blot  heme SN mac -- improved hazy view, grossly flat, Drusen   Vessels attenuated, mild tortuosity attenuated, Tortuous   Periphery Attached Hazy view; superior, nasal and temporal periphery visible and grossly attached; inferior periphery obscured by white VH           Refraction     Manifest Refraction       Sphere Cylinder Dist VA   Right +0.75 Sphere 20/NI   Left +0.50 Sphere 20/NI  UNABLE TO REFRACT BETTER OU          IMAGING AND PROCEDURES  Imaging and Procedures for 05/06/2022  OCT, Retina - OU - Both Eyes       Right Eye Quality was good. Central Foveal Thickness: 249. Progression has been stable. Findings include normal foveal contour, no IRF, no SRF, retinal drusen , pigment epithelial detachment.   Left Eye Quality was poor. Central Foveal Thickness: 340. Progression has improved. Findings include no IRF, no SRF, abnormal foveal contour, retinal drusen , epiretinal membrane, pigment epithelial detachment (Mild interval improvement in vitreous opacities).   Notes *Images captured and stored on drive  Diagnosis / Impression:  non-exu ARMD with central drusen and PED OU OS: Mild interval improvement in vitreous opacities  Clinical management:  See below  Abbreviations: NFP - Normal foveal profile. CME - cystoid macular edema. PED - pigment epithelial detachment. IRF - intraretinal fluid. SRF - subretinal fluid. EZ - ellipsoid zone. ERM - epiretinal membrane. ORA - outer retinal atrophy. ORT - outer retinal tubulation. SRHM - subretinal hyper-reflective material. IRHM - intraretinal hyper-reflective material      Intravitreal Injection, Pharmacologic Agent - OS - Left Eye       Time Out 05/06/2022. 2:37 PM. Confirmed correct patient, procedure, site, and patient consented.   Anesthesia Topical anesthesia was used. Anesthetic medications included Lidocaine 2%, Proparacaine 0.5%.   Procedure Preparation  included 5% betadine to ocular surface, eyelid speculum. A (32g) needle was used.   Injection: 1.25 mg Bevacizumab 1.25mg /0.70ml   Route: Intravitreal, Site: Left Eye   NDC: [id number], Lot: [id number], Expiration date: 08/02/2022   Post-op Post injection exam found visual acuity of at least counting fingers. The patient tolerated the procedure well. There were no complications. The patient received written and verbal post procedure care education.            ASSESSMENT/PLAN:    ICD-10-CM   1. Vitreous hemorrhage of left eye (HCC)  H43.12 OCT, Retina - OU - Both Eyes    Intravitreal Injection, Pharmacologic Agent - OS - Left Eye    Bevacizumab (AVASTIN) SOLN 1.25 mg    2. Intermediate stage nonexudative age-related macular degeneration of both eyes  H35.3132     3. Essential hypertension  I10     4. Hypertensive retinopathy of both eyes  H35.033     5. Pseudophakia, both eyes  Z96.1     6. Primary open angle glaucoma (POAG) of both eyes, moderate stage  H40.1132       Vitreous hemorrhage OS  - pt with poor memory, but reports acute floaters, onset ~02/25/22 - unclear etiology but risk factors include -- hemorrhagic PVD, htn retinopathy, ESRD on dialysis -- gets heparin during dialysis  - s/p IVA OS (01.30.24), #2 (02.27.24)  - pt and friend report improved compliance with VH precautions  - BCVA 20/200 decreased from 20/150 OS  - exam shows blood stained vit condensations improving slowly  - recommend IVA OS #3 today, 03.26.24  - pt wishes to proceed  with injection - IVA informed consent obtained and signed (01.30.24) - VH precautions reviewed -- minimize activities, keep head elevated, avoid ASA/NSAIDs/blood thinners as able - f/u 4 weeks, DFE, OCT, possible injxn  2. Age related macular degeneration, non-exudative, both eyes - The incidence, anatomy, and pathology of dry AMD, risk of progression, and the AREDS and AREDS 2 study including smoking risks discussed with  patient.  - Recommend amsler grid monitoring  - f/u 3 months  3,4. Hypertensive retinopathy OU - discussed importance of tight BP control - continue to monitor  5. Pseudophakia OU  - s/p CE/IOL  - IOL in good position, doing well  - continue to monitor  6. Glaucoma  - IOP today: 11,09  - disc OD with pallor and cupping; c/d 0.85  - cont Cosopt and Brimonidine BID OD only  Ophthalmic Meds Ordered this visit:  Meds ordered this encounter  Medications   Bevacizumab (AVASTIN) SOLN 1.25 mg     Return in about 4 weeks (around 06/03/2022) for f/u VH OS, DFE, OCT.  There are no Patient Instructions on file for this visit.   Explained the diagnoses, plan, and follow up with the patient and they expressed understanding.  Patient expressed understanding of the importance of proper follow up care.   This document serves as a record of services personally performed by Gardiner Sleeper, MD, PhD. It was created on their behalf by Renaldo Reel, [place] an ophthalmic technician. The creation of this record is the provider's dictation and/or activities during the visit.    Electronically signed by:  Renaldo Reel, COT  03.13.24 1:47 AM  This document serves as a record of services personally performed by Gardiner Sleeper, MD, PhD. It was created on their behalf by San Jetty. Owens Shark, OA an ophthalmic technician. The creation of this record is the provider's dictation and/or activities during the visit.    Electronically signed by: San Jetty. Owens Shark, [place] 03.26.2024 1:47 AM  Gardiner Sleeper, M.D., Ph.D. Diseases & Surgery of the Retina and Vitreous [facility]  I have reviewed the above documentation for accuracy and completeness, and I agree with the above. Gardiner Sleeper, M.D., Ph.D. 05/08/22 1:49 AM   Abbreviations: M myopia (nearsighted); A astigmatism; H hyperopia (farsighted); P presbyopia; Mrx spectacle prescription;  CTL contact lenses; OD right eye; OS left  eye; OU both eyes  XT exotropia; ET esotropia; PEK punctate epithelial keratitis; PEE punctate epithelial erosions; DES dry eye syndrome; MGD meibomian gland dysfunction; ATs artificial tears; PFAT's preservative free artificial tears; [place] nuclear sclerotic cataract; PSC posterior subcapsular cataract; ERM epi-retinal membrane; PVD posterior vitreous detachment; RD retinal detachment; DM diabetes mellitus; DR diabetic retinopathy; NPDR non-proliferative diabetic retinopathy; PDR proliferative diabetic retinopathy; CSME clinically significant macular edema; DME diabetic macular edema; dbh dot blot hemorrhages; CWS cotton wool spot; POAG primary open angle glaucoma; C/D cup-to-disc ratio; HVF humphrey visual field; GVF goldmann visual field; OCT optical coherence tomography; IOP intraocular pressure; BRVO Branch retinal vein occlusion; CRVO central retinal vein occlusion; CRAO central retinal artery occlusion; BRAO branch retinal artery occlusion; RT retinal tear; SB scleral buckle; PPV pars plana vitrectomy; VH Vitreous hemorrhage; PRP panretinal laser photocoagulation; IVK intravitreal kenalog; VMT vitreomacular traction; MH Macular hole;  NVD neovascularization of the disc; NVE neovascularization elsewhere; AREDS age related eye disease study; ARMD age related macular degeneration; POAG primary open angle glaucoma; EBMD epithelial/anterior basement membrane dystrophy; ACIOL anterior chamber intraocular lens; IOL intraocular lens; PCIOL posterior chamber intraocular  lens; Phaco/IOL phacoemulsification with intraocular lens placement; [place] photorefractive keratectomy; LASIK laser assisted in situ keratomileusis; HTN hypertension; DM diabetes mellitus; COPD chronic obstructive pulmonary disease

## 2022-04-25 DIAGNOSIS — Z992 Dependence on renal dialysis: Secondary | ICD-10-CM | POA: Diagnosis not present

## 2022-04-25 DIAGNOSIS — N186 End stage renal disease: Secondary | ICD-10-CM | POA: Diagnosis not present

## 2022-04-28 DIAGNOSIS — Z992 Dependence on renal dialysis: Secondary | ICD-10-CM | POA: Diagnosis not present

## 2022-04-28 DIAGNOSIS — N186 End stage renal disease: Secondary | ICD-10-CM | POA: Diagnosis not present

## 2022-05-02 DIAGNOSIS — Z992 Dependence on renal dialysis: Secondary | ICD-10-CM | POA: Diagnosis not present

## 2022-05-02 DIAGNOSIS — N186 End stage renal disease: Secondary | ICD-10-CM | POA: Diagnosis not present

## 2022-05-06 ENCOUNTER — Ambulatory Visit (INDEPENDENT_AMBULATORY_CARE_PROVIDER_SITE_OTHER): Payer: 59 | Admitting: Ophthalmology

## 2022-05-06 ENCOUNTER — Encounter (INDEPENDENT_AMBULATORY_CARE_PROVIDER_SITE_OTHER): Payer: Self-pay | Admitting: Ophthalmology

## 2022-05-06 DIAGNOSIS — Z961 Presence of intraocular lens: Secondary | ICD-10-CM

## 2022-05-06 DIAGNOSIS — H401132 Primary open-angle glaucoma, bilateral, moderate stage: Secondary | ICD-10-CM

## 2022-05-06 DIAGNOSIS — H35033 Hypertensive retinopathy, bilateral: Secondary | ICD-10-CM

## 2022-05-06 DIAGNOSIS — H353132 Nonexudative age-related macular degeneration, bilateral, intermediate dry stage: Secondary | ICD-10-CM

## 2022-05-06 DIAGNOSIS — I10 Essential (primary) hypertension: Secondary | ICD-10-CM | POA: Diagnosis not present

## 2022-05-06 DIAGNOSIS — H4312 Vitreous hemorrhage, left eye: Secondary | ICD-10-CM | POA: Diagnosis not present

## 2022-05-06 MED ORDER — BEVACIZUMAB CHEMO INJECTION 1.25MG/0.05ML SYRINGE FOR KALEIDOSCOPE
1.2500 mg | INTRAVITREAL | Status: AC | PRN
  Administered 2022-05-06: 1.25 mg via INTRAVITREAL

## 2022-05-08 DIAGNOSIS — Z992 Dependence on renal dialysis: Secondary | ICD-10-CM | POA: Diagnosis not present

## 2022-05-08 DIAGNOSIS — N186 End stage renal disease: Secondary | ICD-10-CM | POA: Diagnosis not present

## 2022-05-11 DIAGNOSIS — Z992 Dependence on renal dialysis: Secondary | ICD-10-CM | POA: Diagnosis not present

## 2022-05-11 DIAGNOSIS — N186 End stage renal disease: Secondary | ICD-10-CM | POA: Diagnosis not present

## 2022-05-12 DIAGNOSIS — Z992 Dependence on renal dialysis: Secondary | ICD-10-CM | POA: Diagnosis not present

## 2022-05-12 DIAGNOSIS — N186 End stage renal disease: Secondary | ICD-10-CM | POA: Diagnosis not present

## 2022-05-15 DIAGNOSIS — C44319 Basal cell carcinoma of skin of other parts of face: Secondary | ICD-10-CM | POA: Diagnosis not present

## 2022-05-16 DIAGNOSIS — Z992 Dependence on renal dialysis: Secondary | ICD-10-CM | POA: Diagnosis not present

## 2022-05-16 DIAGNOSIS — N186 End stage renal disease: Secondary | ICD-10-CM | POA: Diagnosis not present

## 2022-05-19 DIAGNOSIS — N186 End stage renal disease: Secondary | ICD-10-CM | POA: Diagnosis not present

## 2022-05-19 DIAGNOSIS — Z992 Dependence on renal dialysis: Secondary | ICD-10-CM | POA: Diagnosis not present

## 2022-05-20 NOTE — Progress Notes (Signed)
[facility] - Clinic Note  06/03/2022     CHIEF COMPLAINT Patient presents for Retina Follow Up   HISTORY OF PRESENT ILLNESS: Ronald [name] is a 82 y.o. male who presents to the clinic today for:   HPI     Retina Follow Up   This started 4 weeks ago.  Duration of 4 weeks.  Since onset it is gradually worsening.  I, the attending physician,  performed the HPI with the patient and updated documentation appropriately.        Comments   4 week retina follow up VH OS and IVA OS pt is reporting left eye does not seems as sharp and may have got a little worse since his last visit pt denies any flashes or floaters       Last edited by Ronald Chris, MD on 06/03/2022  4:25 PM.    Pt feels like vision is worse  Referring physician: [name], MD [address] [place],  [place] [address]  HISTORICAL INFORMATION:   Selected notes from the MEDICAL RECORD NUMBER Referred by Dr. Charise [name] for [facility]:  Ocular Hx- PMH-    CURRENT MEDICATIONS: Current Outpatient Medications (Ophthalmic Drugs)  Medication Sig   brimonidine (ALPHAGAN) 0.2 % ophthalmic solution Place 1 drop into the right eye 2 (two) times daily.   dorzolamide-timolol (COSOPT) 2-0.5 % ophthalmic solution Place 1 drop into the right eye 2 (two) times daily.   No current facility-administered medications for this visit. (Ophthalmic Drugs)   Current Outpatient Medications (Other)  Medication Sig   atorvastatin (LIPITOR) 80 MG tablet Take 80 mg by mouth in the morning.   carvedilol (COREG) 12.5 MG tablet Take 6.25 mg by mouth 2 (two) times daily with a meal.   magnesium oxide (MAG-OX) 400 MG tablet Take 400 mg by mouth every evening.   memantine (NAMENDA) 5 MG tablet Take 1 tablet (5 mg at night) for 2 weeks, then increase to 1 tablet (5 mg) twice a day   tamsulosin (FLOMAX) 0.4 MG CAPS capsule Take 1 capsule (0.4 mg total) by mouth daily after supper.   traMADol (ULTRAM) 50 MG tablet Take  50 mg by mouth 3 (three) times daily as needed (pain.).   zolpidem (AMBIEN) 10 MG tablet Take 10 mg by mouth at bedtime.   No current facility-administered medications for this visit. (Other)   REVIEW OF SYSTEMS: ROS   Positive for: Cardiovascular, Eyes Negative for: Constitutional, Gastrointestinal, Neurological, Skin, Genitourinary, Musculoskeletal, HENT, Endocrine, Respiratory, Psychiatric, Allergic/Imm, Heme/Lymph Last edited by Ronald [name], COT on 06/03/2022  2:49 PM.        ALLERGIES No Known Allergies  PAST MEDICAL HISTORY Past Medical History:  Diagnosis Date   GERD (gastroesophageal reflux disease)    Glaucoma    Hypertension    Macular degeneration    Myocardial infarction    1999   Prostate cancer 02/2018   Renal insufficiency    Stroke    no deficits; had stroke while trying to place "stents in legs".   Past Surgical History:  Procedure Laterality Date   ABDOMINAL AORTIC ANEURYSM REPAIR  2012   IN [place]   APPENDECTOMY     AV FISTULA PLACEMENT Left 10/16/2020   Procedure: LEFT ARM ARTERIOVENOUS (AV) FISTULA CREATION;  Surgeon: Ronald Earthly, MD;  Location: [facility];  Service: Vascular;  Laterality: Left;   CORONARY ARTERY BYPASS GRAFT  01/1998   3 vessels 18 years ago  INGUINAL HERNIA REPAIR Right 09/20/2015   Procedure: REPAIR RIGHT INGUINAL HERNIA  WITH MESH;  Surgeon: Ronald Linsey, MD;  Location: [facility];  Service: General;  Laterality: Right;   INSERTION OF DIALYSIS CATHETER Right 08/02/2020   Procedure: INSERTION OF RIGHT TUNNELED INTERNAL JUGULAR  DIALYSIS CATHETER;  Surgeon: Ronald Earthly, MD;  Location: [facility];  Service: Vascular;  Laterality: Right;   REMOVAL OF A DIALYSIS CATHETER N/A 03/19/2021   Procedure: MINOR REMOVAL OF A TUNNELED DIALYSIS CATHETER;  Surgeon: Ronald Earthly, MD;  Location: [facility];  Service: Vascular;  Laterality: N/A;   tumor removal from bladder     FAMILY HISTORY Family History  Problem Relation Age of Onset    Cancer Father    SOCIAL HISTORY Social History   Tobacco Use   Smoking status: Former    Packs/day: 0.50    Years: 50.00    Additional pack years: 0.00    Total pack years: 25.00    Types: Cigarettes    Start date: 08/07/1957   Smokeless tobacco: Never   Tobacco comments:    on chantix now  Vaping Use   Vaping Use: Never used  Substance Use Topics   Alcohol use: No   Drug use: No       OPHTHALMIC EXAM:  Base Eye Exam     Visual Acuity (Snellen - Linear)       Right Left   Dist [place] 20/40 20/150   Dist ph [place] NI NI         Tonometry (Tonopen, 2:54 PM)       Right Left   Pressure 13 8         Pupils       Pupils Dark Light Shape React APD   Right PERRL 3 2 Round Brisk None   Left PERRL 2 1 Round Sluggish None         Visual Fields       Left Right    Full Full         Extraocular Movement       Right Left    Full, Ortho Full, Ortho         Neuro/Psych     Oriented x3: Yes   Mood/Affect: Normal         Dilation     Both eyes: 2.5% Phenylephrine @ 2:54 PM           Slit Lamp and Fundus Exam     Slit Lamp Exam       Right Left   Lids/Lashes Dermatochalasis - upper lid, mild MGD Dermatochalasis - upper lid, mild MGD, Meibomian gland dysfunction   Conjunctiva/Sclera White and quiet White and quiet   Cornea 2+ inferior Punctate epithelial erosions, mild arcus, well healed cataract wound 2+ inferior Punctate epithelial erosions, mild arcus, well healed cataract wound, tear film debris   Anterior Chamber deep and clear deep and clear   Iris Round and dilated Round and dilated   Lens PC IOL in good position PC IOL in good position, 1+ Posterior capsular opacification   Anterior Vitreous mild syneresis blood stained vitreous condensations clearing and turning white, white blood clots settled inferiorly         Fundus Exam       Right Left   Disc +Pallor, +cupping, very thin rim obscured by red heme, +fibrosis   C/D Ratio 0.85     Macula Flat, Blunted foveal reflex, Drusen, central PED, mild ERM  hazy view -- improved, blunted foveal reflex, central drusen, RPE mottling, focal fibrosis nasal macula   Vessels attenuated, mild tortuosity Severe attenuation, tortuosity   Periphery Attached Hazy view; superior, nasal and temporal periphery visible and grossly attached; inferior periphery obscured by white VH           IMAGING AND PROCEDURES  Imaging and Procedures for 06/03/2022  OCT, Retina - OU - Both Eyes       Right Eye Quality was good. Central Foveal Thickness: 249. Progression has been stable. Findings include normal foveal contour, no IRF, no SRF, retinal drusen , pigment epithelial detachment.   Left Eye Quality was poor. Central Foveal Thickness: 329. Progression has improved. Findings include no IRF, no SRF, abnormal foveal contour, retinal drusen , intraretinal hyper-reflective material, epiretinal membrane, macular pucker, pigment epithelial detachment, outer retinal atrophy (interval improvement in vitreous opacities, ERM with mild pucker, diffuse ORA and central drusen).   Notes *Images captured and stored on drive  Diagnosis / Impression:  non-exu ARMD with central drusen and PED OU OS: interval improvement in vitreous opacities, ERM with mild pucker, diffuse ORA and central drusen  Clinical management:  See below  Abbreviations: NFP - Normal foveal profile. CME - cystoid macular edema. PED - pigment epithelial detachment. IRF - intraretinal fluid. SRF - subretinal fluid. EZ - ellipsoid zone. ERM - epiretinal membrane. ORA - outer retinal atrophy. ORT - outer retinal tubulation. SRHM - subretinal hyper-reflective material. IRHM - intraretinal hyper-reflective material      Intravitreal Injection, Pharmacologic Agent - OS - Left Eye       Time Out 06/03/2022. 3:33 PM. Confirmed correct patient, procedure, site, and patient consented.   Anesthesia Topical anesthesia was used. Anesthetic  medications included Lidocaine 2%, Proparacaine 0.5%.   Procedure Preparation included 5% betadine to ocular surface, eyelid speculum. A supplied (32g) needle was used.   Injection: 1.25 mg Bevacizumab 1.25mg /0.69ml   Route: Intravitreal, Site: Left Eye   NDC: 60454-098-11, Lot: [id number] , Expiration date: 07/11/2022   Post-op Post injection exam found visual acuity of at least counting fingers. The patient tolerated the procedure well. There were no complications. The patient received written and verbal post procedure care education. Post injection medications were not given.            ASSESSMENT/PLAN:    ICD-10-CM   1. Vitreous hemorrhage of left eye  H43.12 OCT, Retina - OU - Both Eyes    Intravitreal Injection, Pharmacologic Agent - OS - Left Eye    Bevacizumab (AVASTIN) SOLN 1.25 mg    2. Intermediate stage nonexudative age-related macular degeneration of both eyes  H35.3132 OCT, Retina - OU - Both Eyes    3. Essential hypertension  I10     4. Hypertensive retinopathy of both eyes  H35.033     5. Pseudophakia, both eyes  Z96.1     6. Primary open angle glaucoma (POAG) of both eyes, moderate stage  H40.1132      Vitreous hemorrhage OS  - pt with poor memory, but reports acute floaters, onset ~02/25/22 - unclear etiology but risk factors include -- hemorrhagic PVD, htn retinopathy, ESRD on dialysis -- gets heparin during dialysis  - s/p IVA OS (01.30.24), #2 (02.27.24), #3 (03.26.24)  - BCVA improved to 20/150 from 20/200 OS  - exam shows blood stained vit condensations clearing, settling inferiorly, and heme turning white  - recommend IVA OS #4 today, 04.23.24  - pt wishes to proceed with injection - IVA informed consent  obtained and signed (01.30.24) - VH precautions reviewed -- minimize activities, keep head elevated, avoid ASA/NSAIDs/blood thinners as able - f/u 4 weeks, DFE, OCT, possible injxn  2. Age related macular degeneration, non-exudative, both eyes -  The incidence, anatomy, and pathology of dry AMD, risk of progression, and the AREDS and AREDS 2 study including smoking risks discussed with patient.  - Recommend amsler grid monitoring  - f/u 3 months  3,4. Hypertensive retinopathy OU - discussed importance of tight BP control - continue to monitor  5. Pseudophakia OU  - s/p CE/IOL  - IOL in good position, doing well  - continue to monitor  6. Glaucoma  - IOP today: 13,08  - disc OD with pallor and cupping; c/d 0.85  - cont Cosopt and Brimonidine BID OD only  Ophthalmic Meds Ordered this visit:  Meds ordered this encounter  Medications   Bevacizumab (AVASTIN) SOLN 1.25 mg     Return in about 4 weeks (around 07/01/2022) for f/u VH OS, DFE, OCT.  There are no Patient Instructions on file for this visit.   Explained the diagnoses, plan, and follow up with the patient and they expressed understanding.  Patient expressed understanding of the importance of proper follow up care.   This document serves as a record of services personally performed by Karie Chimera, MD, PhD. It was created on their behalf by Gerilyn Nestle, COT an ophthalmic technician. The creation of this record is the provider's dictation and/or activities during the visit.    Electronically signed by:  Gerilyn Nestle, COT  04.09.24 4:27 PM  This document serves as a record of services personally performed by Karie Chimera, MD, PhD. It was created on their behalf by Glee Arvin. Manson Passey, OA an ophthalmic technician. The creation of this record is the provider's dictation and/or activities during the visit.    Electronically signed by: Glee Arvin. Manson Passey, [place] 04.23.2024 4:27 PM  Karie Chimera, M.D., Ph.D. Diseases & Surgery of the Retina and Vitreous [facility]  I have reviewed the above documentation for accuracy and completeness, and I agree with the above. Karie Chimera, M.D., Ph.D. 06/03/22 4:28 PM   Abbreviations: M myopia  (nearsighted); A astigmatism; H hyperopia (farsighted); P presbyopia; Mrx spectacle prescription;  CTL contact lenses; OD right eye; OS left eye; OU both eyes  XT exotropia; ET esotropia; PEK punctate epithelial keratitis; PEE punctate epithelial erosions; DES dry eye syndrome; MGD meibomian gland dysfunction; ATs artificial tears; PFAT's preservative free artificial tears; NSC nuclear sclerotic cataract; PSC posterior subcapsular cataract; ERM epi-retinal membrane; PVD posterior vitreous detachment; RD retinal detachment; DM diabetes mellitus; DR diabetic retinopathy; NPDR non-proliferative diabetic retinopathy; PDR proliferative diabetic retinopathy; CSME clinically significant macular edema; DME diabetic macular edema; dbh dot blot hemorrhages; CWS cotton wool spot; POAG primary open angle glaucoma; C/D cup-to-disc ratio; HVF humphrey visual field; GVF goldmann visual field; OCT optical coherence tomography; IOP intraocular pressure; BRVO Branch retinal vein occlusion; CRVO central retinal vein occlusion; CRAO central retinal artery occlusion; BRAO branch retinal artery occlusion; RT retinal tear; SB scleral buckle; PPV pars plana vitrectomy; VH Vitreous hemorrhage; PRP panretinal laser photocoagulation; IVK intravitreal kenalog; VMT vitreomacular traction; MH Macular hole;  NVD neovascularization of the disc; NVE neovascularization elsewhere; AREDS age related eye disease study; ARMD age related macular degeneration; POAG primary open angle glaucoma; EBMD epithelial/anterior basement membrane dystrophy; ACIOL anterior chamber intraocular lens; IOL intraocular lens; PCIOL posterior chamber intraocular lens; Phaco/IOL phacoemulsification with intraocular lens  placement; [place] photorefractive keratectomy; LASIK laser assisted in situ keratomileusis; HTN hypertension; DM diabetes mellitus; COPD chronic obstructive pulmonary disease

## 2022-05-23 DIAGNOSIS — N186 End stage renal disease: Secondary | ICD-10-CM | POA: Diagnosis not present

## 2022-05-23 DIAGNOSIS — Z992 Dependence on renal dialysis: Secondary | ICD-10-CM | POA: Diagnosis not present

## 2022-05-26 DIAGNOSIS — N186 End stage renal disease: Secondary | ICD-10-CM | POA: Diagnosis not present

## 2022-05-26 DIAGNOSIS — Z992 Dependence on renal dialysis: Secondary | ICD-10-CM | POA: Diagnosis not present

## 2022-05-30 DIAGNOSIS — N186 End stage renal disease: Secondary | ICD-10-CM | POA: Diagnosis not present

## 2022-05-30 DIAGNOSIS — Z992 Dependence on renal dialysis: Secondary | ICD-10-CM | POA: Diagnosis not present

## 2022-06-02 DIAGNOSIS — N186 End stage renal disease: Secondary | ICD-10-CM | POA: Diagnosis not present

## 2022-06-02 DIAGNOSIS — Z992 Dependence on renal dialysis: Secondary | ICD-10-CM | POA: Diagnosis not present

## 2022-06-03 ENCOUNTER — Encounter (INDEPENDENT_AMBULATORY_CARE_PROVIDER_SITE_OTHER): Payer: Self-pay | Admitting: Ophthalmology

## 2022-06-03 ENCOUNTER — Ambulatory Visit (INDEPENDENT_AMBULATORY_CARE_PROVIDER_SITE_OTHER): Payer: 59 | Admitting: Ophthalmology

## 2022-06-03 DIAGNOSIS — H353132 Nonexudative age-related macular degeneration, bilateral, intermediate dry stage: Secondary | ICD-10-CM | POA: Diagnosis not present

## 2022-06-03 DIAGNOSIS — H35033 Hypertensive retinopathy, bilateral: Secondary | ICD-10-CM | POA: Diagnosis not present

## 2022-06-03 DIAGNOSIS — I10 Essential (primary) hypertension: Secondary | ICD-10-CM

## 2022-06-03 DIAGNOSIS — Z961 Presence of intraocular lens: Secondary | ICD-10-CM

## 2022-06-03 DIAGNOSIS — H401132 Primary open-angle glaucoma, bilateral, moderate stage: Secondary | ICD-10-CM | POA: Diagnosis not present

## 2022-06-03 DIAGNOSIS — H4312 Vitreous hemorrhage, left eye: Secondary | ICD-10-CM

## 2022-06-03 MED ORDER — BEVACIZUMAB CHEMO INJECTION 1.25MG/0.05ML SYRINGE FOR KALEIDOSCOPE
1.2500 mg | INTRAVITREAL | Status: AC | PRN
  Administered 2022-06-03: 1.25 mg via INTRAVITREAL

## 2022-06-09 DIAGNOSIS — N186 End stage renal disease: Secondary | ICD-10-CM | POA: Diagnosis not present

## 2022-06-09 DIAGNOSIS — Z992 Dependence on renal dialysis: Secondary | ICD-10-CM | POA: Diagnosis not present

## 2022-06-10 DIAGNOSIS — Z992 Dependence on renal dialysis: Secondary | ICD-10-CM | POA: Diagnosis not present

## 2022-06-10 DIAGNOSIS — N186 End stage renal disease: Secondary | ICD-10-CM | POA: Diagnosis not present

## 2022-06-13 DIAGNOSIS — N186 End stage renal disease: Secondary | ICD-10-CM | POA: Diagnosis not present

## 2022-06-13 DIAGNOSIS — Z992 Dependence on renal dialysis: Secondary | ICD-10-CM | POA: Diagnosis not present

## 2022-06-16 DIAGNOSIS — N186 End stage renal disease: Secondary | ICD-10-CM | POA: Diagnosis not present

## 2022-06-16 DIAGNOSIS — Z992 Dependence on renal dialysis: Secondary | ICD-10-CM | POA: Diagnosis not present

## 2022-06-20 DIAGNOSIS — N186 End stage renal disease: Secondary | ICD-10-CM | POA: Diagnosis not present

## 2022-06-20 DIAGNOSIS — Z992 Dependence on renal dialysis: Secondary | ICD-10-CM | POA: Diagnosis not present

## 2022-06-23 ENCOUNTER — Other Ambulatory Visit: Payer: Self-pay | Admitting: Physician Assistant

## 2022-06-23 DIAGNOSIS — Z992 Dependence on renal dialysis: Secondary | ICD-10-CM | POA: Diagnosis not present

## 2022-06-23 DIAGNOSIS — N186 End stage renal disease: Secondary | ICD-10-CM | POA: Diagnosis not present

## 2022-06-27 DIAGNOSIS — N186 End stage renal disease: Secondary | ICD-10-CM | POA: Diagnosis not present

## 2022-06-27 DIAGNOSIS — Z992 Dependence on renal dialysis: Secondary | ICD-10-CM | POA: Diagnosis not present

## 2022-06-30 DIAGNOSIS — N186 End stage renal disease: Secondary | ICD-10-CM | POA: Diagnosis not present

## 2022-06-30 DIAGNOSIS — Z992 Dependence on renal dialysis: Secondary | ICD-10-CM | POA: Diagnosis not present

## 2022-07-01 ENCOUNTER — Encounter (INDEPENDENT_AMBULATORY_CARE_PROVIDER_SITE_OTHER): Payer: 59 | Admitting: Ophthalmology

## 2022-07-01 DIAGNOSIS — H401132 Primary open-angle glaucoma, bilateral, moderate stage: Secondary | ICD-10-CM

## 2022-07-01 DIAGNOSIS — H353132 Nonexudative age-related macular degeneration, bilateral, intermediate dry stage: Secondary | ICD-10-CM

## 2022-07-01 DIAGNOSIS — I10 Essential (primary) hypertension: Secondary | ICD-10-CM

## 2022-07-01 DIAGNOSIS — Z961 Presence of intraocular lens: Secondary | ICD-10-CM

## 2022-07-01 DIAGNOSIS — H4312 Vitreous hemorrhage, left eye: Secondary | ICD-10-CM

## 2022-07-01 DIAGNOSIS — H35033 Hypertensive retinopathy, bilateral: Secondary | ICD-10-CM

## 2022-07-04 DIAGNOSIS — Z992 Dependence on renal dialysis: Secondary | ICD-10-CM | POA: Diagnosis not present

## 2022-07-04 DIAGNOSIS — N186 End stage renal disease: Secondary | ICD-10-CM | POA: Diagnosis not present

## 2022-07-07 DIAGNOSIS — Z992 Dependence on renal dialysis: Secondary | ICD-10-CM | POA: Diagnosis not present

## 2022-07-07 DIAGNOSIS — N186 End stage renal disease: Secondary | ICD-10-CM | POA: Diagnosis not present

## 2022-07-11 DIAGNOSIS — Z992 Dependence on renal dialysis: Secondary | ICD-10-CM | POA: Diagnosis not present

## 2022-07-11 DIAGNOSIS — N186 End stage renal disease: Secondary | ICD-10-CM | POA: Diagnosis not present

## 2022-07-14 DIAGNOSIS — N186 End stage renal disease: Secondary | ICD-10-CM | POA: Diagnosis not present

## 2022-07-14 DIAGNOSIS — Z992 Dependence on renal dialysis: Secondary | ICD-10-CM | POA: Diagnosis not present

## 2022-07-18 DIAGNOSIS — Z992 Dependence on renal dialysis: Secondary | ICD-10-CM | POA: Diagnosis not present

## 2022-07-18 DIAGNOSIS — N186 End stage renal disease: Secondary | ICD-10-CM | POA: Diagnosis not present

## 2022-07-25 DIAGNOSIS — N186 End stage renal disease: Secondary | ICD-10-CM | POA: Diagnosis not present

## 2022-07-25 DIAGNOSIS — Z992 Dependence on renal dialysis: Secondary | ICD-10-CM | POA: Diagnosis not present

## 2022-07-28 DIAGNOSIS — N186 End stage renal disease: Secondary | ICD-10-CM | POA: Diagnosis not present

## 2022-07-28 DIAGNOSIS — Z992 Dependence on renal dialysis: Secondary | ICD-10-CM | POA: Diagnosis not present

## 2022-08-01 ENCOUNTER — Other Ambulatory Visit: Payer: Self-pay | Admitting: Physician Assistant

## 2022-08-01 DIAGNOSIS — Z992 Dependence on renal dialysis: Secondary | ICD-10-CM | POA: Diagnosis not present

## 2022-08-01 DIAGNOSIS — N186 End stage renal disease: Secondary | ICD-10-CM | POA: Diagnosis not present

## 2022-08-04 DIAGNOSIS — N186 End stage renal disease: Secondary | ICD-10-CM | POA: Diagnosis not present

## 2022-08-04 DIAGNOSIS — Z992 Dependence on renal dialysis: Secondary | ICD-10-CM | POA: Diagnosis not present

## 2022-08-08 DIAGNOSIS — N186 End stage renal disease: Secondary | ICD-10-CM | POA: Diagnosis not present

## 2022-08-08 DIAGNOSIS — Z992 Dependence on renal dialysis: Secondary | ICD-10-CM | POA: Diagnosis not present

## 2022-08-08 IMAGING — US US EXTREM UP VEIN MAPPING UNILAT
1 series · 13 of 24 positions shown · non-contrast
Comparison: None.

CLINICAL DATA: Evaluate bilateral upper extremity arterial and
venous systems for potential dialysis access creation.

EXAM:
US EXTREM UP VEIN MAPPING

[Series 1: us ue vein mapping right (pre-op avf) · non-contrast · 13 of 150 slices shown]
[im 1/150]
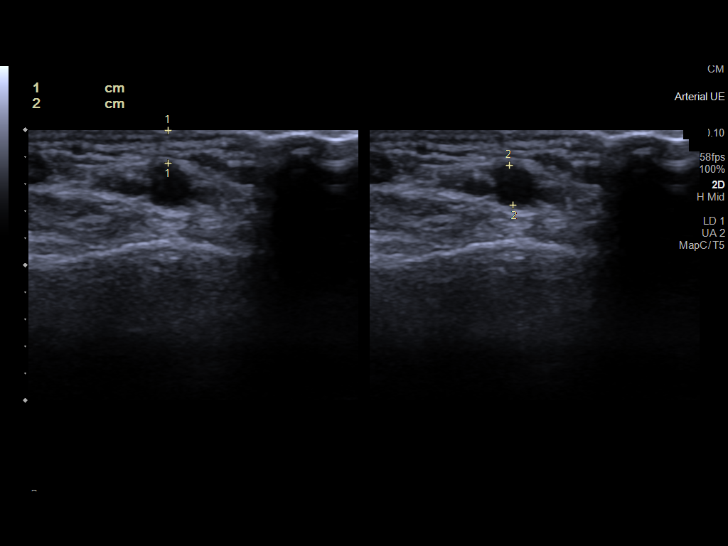
[im 13/150]
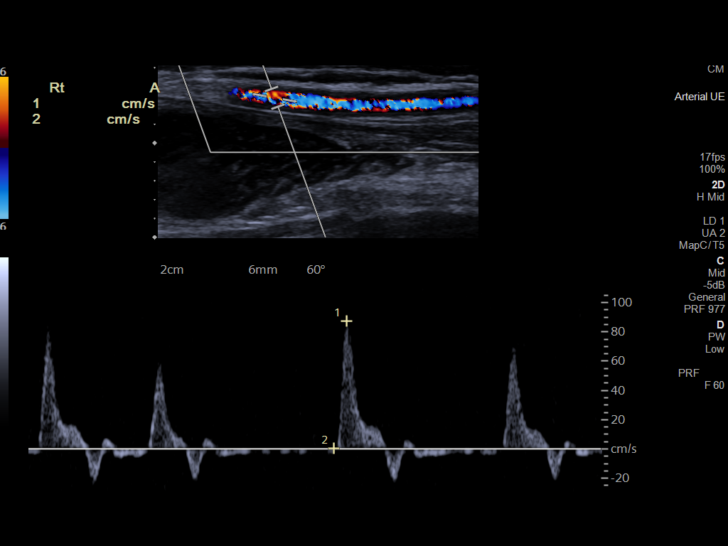
[im 26/150]
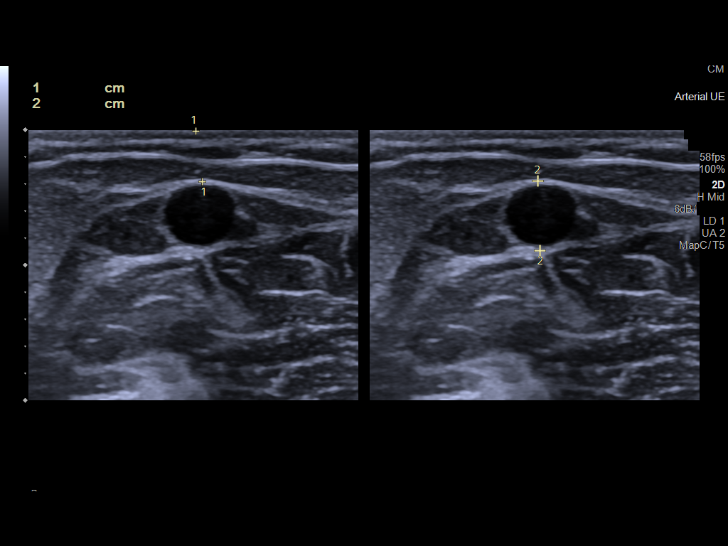
[im 39/150]
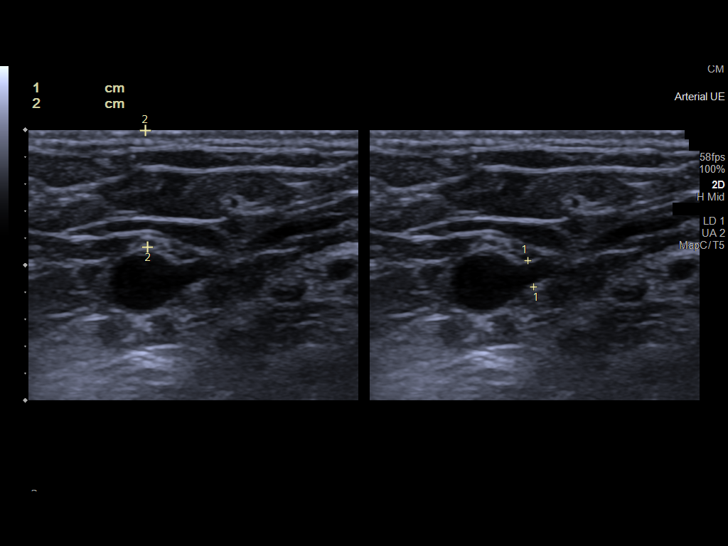
[im 52/150]
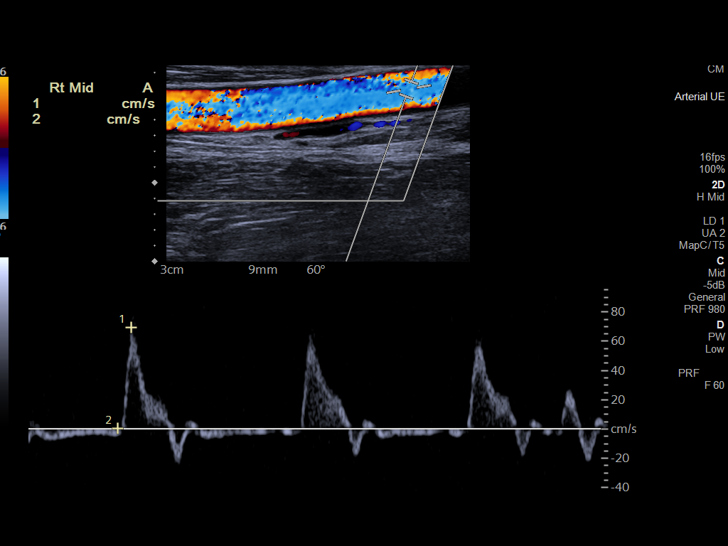
[im 65/150]
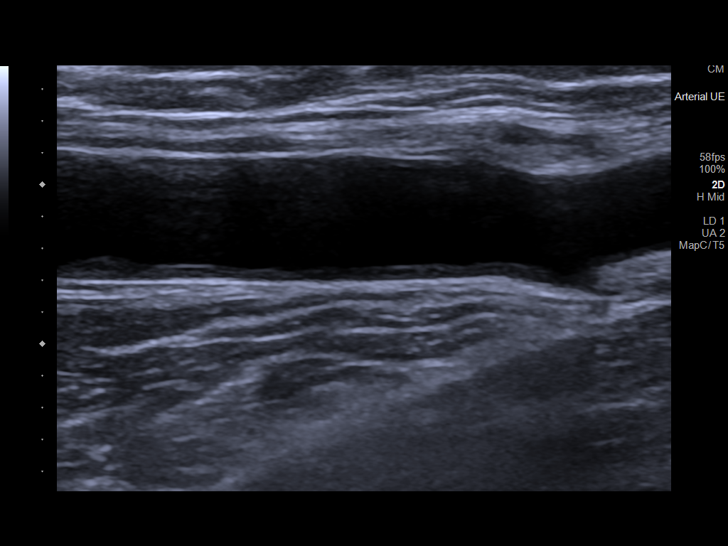
[im 78/150]
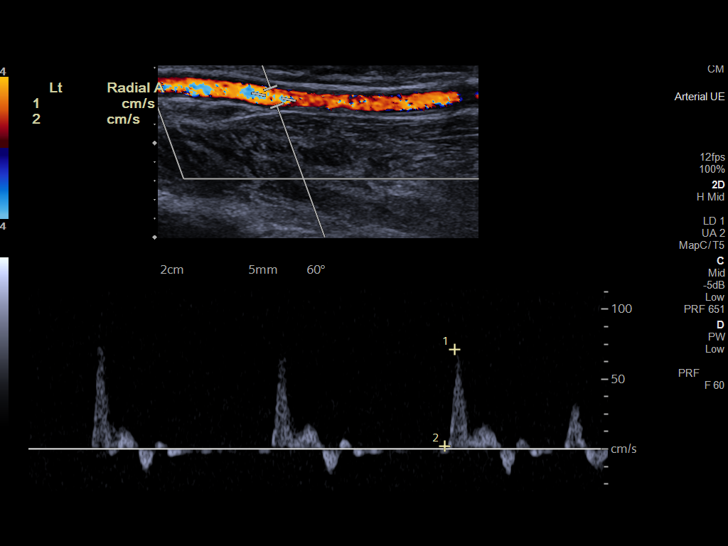
[im 85/150]
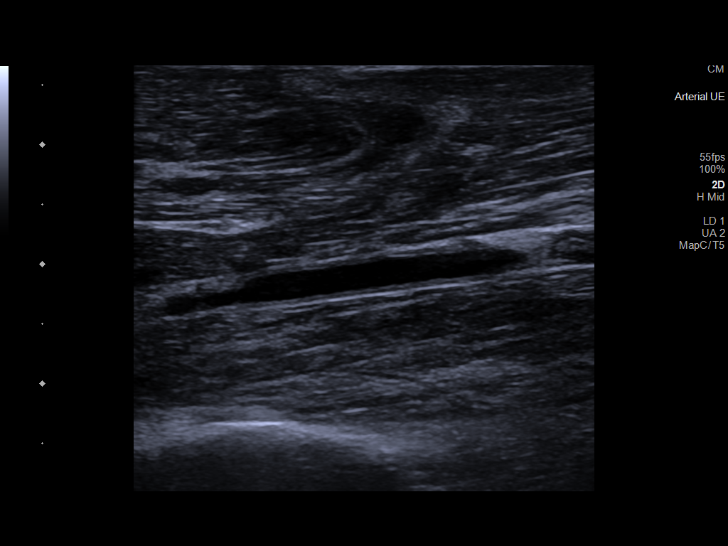
[im 98/150]
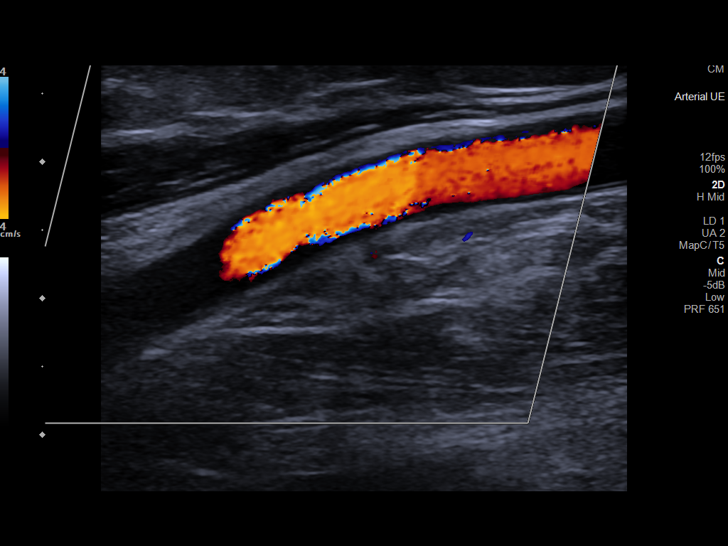
[im 111/150]
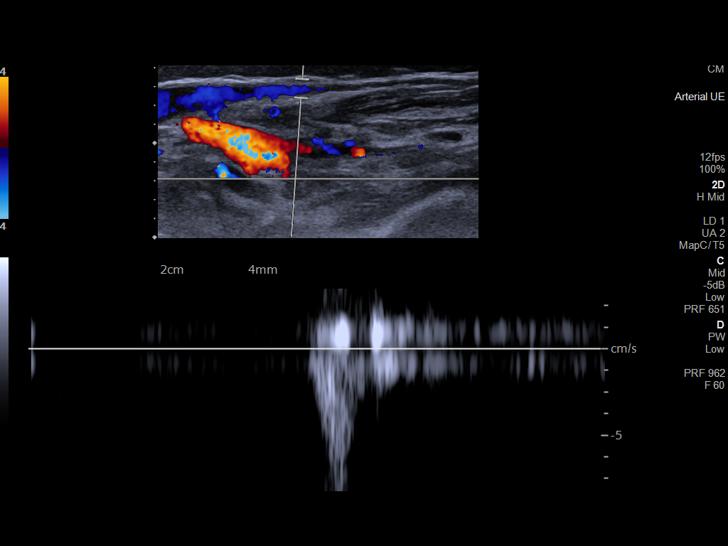
[im 124/150]
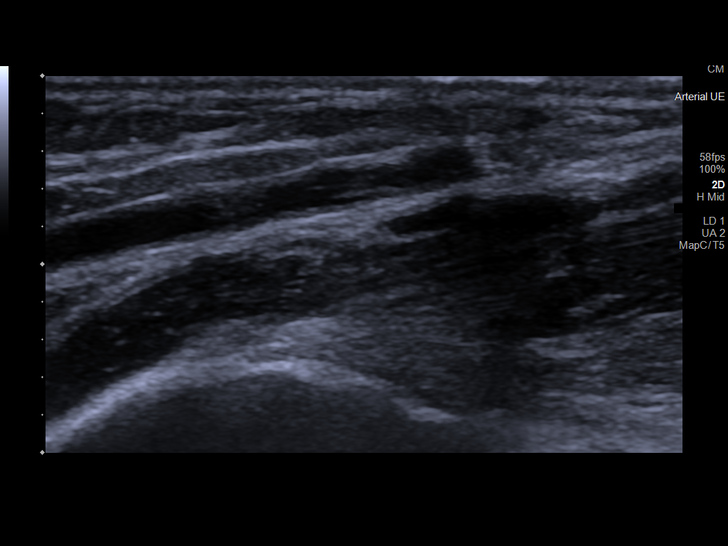
[im 137/150]
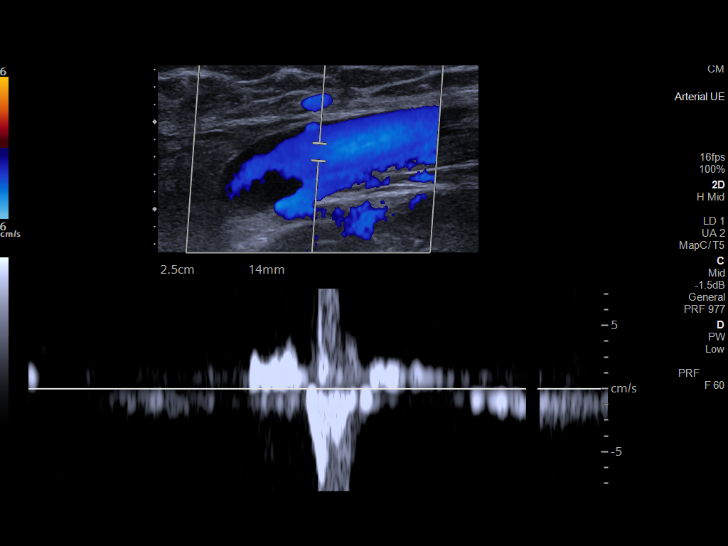
[im 150/150]
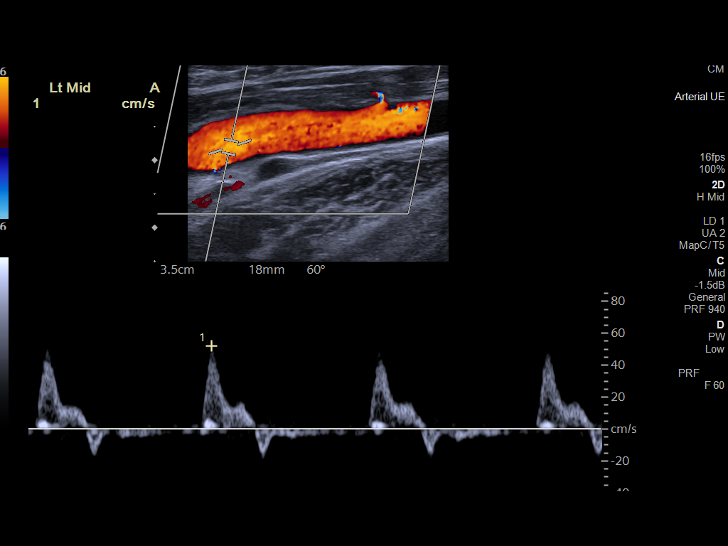

[13 of 24 positions shown; findings below may reference images not displayed]

FINDINGS: RIGHT ARTERIES

Wrist Radial Artery:

Size 3.0 mm Waveform triphasic

Wrist Ulnar Artery:

Size 2.7 mm Waveform Triphasic

Prox. Forearm Radial Artery:

Size 2.6 mm Waveform Triphasic

Upper Arm Brachial Artery:

Size 5.6 mm Waveform Triphasic

RIGHT VEINS

Forearm Cephalic Vein:

Prox 4.8 mm Distal 3.1 mm Depth 1.2 mm

Upper Arm Cephalic Vein:

Prox 4.3 mm Distal 5.2 mm Depth 3.8 mm

Upper Arm Basilic Vein:

Prox 5.5 mm Distal 4.9 mm Depth 8.4 mm

Upper Arm Brachial Vein:

Prox 3.7 mm Distal 2.8 mm Depth 14.2 mm

ADDITIONAL RIGHT VEINS

Axillary Vein: 7.9 mm

Subclavian Vein:

Patent: Yes Respiratory Phasicity: Present

Internal Jugular Vein:

Patent: Yes    Respiratory Phasicity: Present

Branches > 2 mm:

There is an approximately 2 mm branch involving the mid (image 24)
and proximal aspect (image 26) of the forearm segment of the
cephalic vein. Note is made of 2 tiny (less than 2 mm) branches of
the right basilic vein).

All interrogated veins of the right upper extremity widely patent
without evidence of acute or chronic DVT.

_________________________________________________________

LEFT ARTERIES

Wrist Radial Artery:

Size 2.4 mm Waveform Triphasic

Wrist Ulnar Artery:

Size 2.7 mm Waveform Triphasic

Prox. Forearm Radial Artery:

Size 3.3 mm Waveform Triphasic

Upper Arm Brachial Artery:

Size 5.8 mm Waveform Triphasic

LEFT VEINS

Forearm Cephalic Vein:

Prox 2.8 mm Distal 2.5 mm Depth 1.4 mm

Upper Arm Cephalic Vein:

Prox 4.4 mm Distal 5.0 mm Depth 5.1 mm

Upper Arm Basilic Vein:

Prox 2.6 mm Distal - Not Visualized Depth 3.6 mm

Upper Arm Brachial Vein:

Prox 3.0 mm Distal 2.1 mm Depth 12.6 mm

ADDITIONAL LEFT VEINS

Axillary Vein:  12.3mm

Subclavian Vein:

Patent: Yes Respiratory Phasicity: Present

Internal Jugular Vein:

Patent: Yes    Respiratory Phasicity: Present

Branches > 2 mm:

Note is made of an approximately 3.5 mm diameter branch of the
cephalic vein at the level of the antecubital fossa (image 106).
Note is made of a tiny (less than 2 mm) branch involving the mid
humeral aspect of the cephalic vein (image 108).

Note is made of an approximately 2.6 mm diameter branch of the
basilic vein at the level of the antecubital fossa (image 120). Note
is made of a tiny (less than 2 mm) branch involving the brachial
vein (image 111).

All interrogated veins of the left upper extremity are widely patent
without evidence of acute or chronic DVT.
IMPRESSION: 1. Arterial and venous mapping as above.
2. No evidence of acute or chronic DVT within either lower
extremity.

## 2022-08-10 DIAGNOSIS — Z992 Dependence on renal dialysis: Secondary | ICD-10-CM | POA: Diagnosis not present

## 2022-08-10 DIAGNOSIS — N186 End stage renal disease: Secondary | ICD-10-CM | POA: Diagnosis not present

## 2022-08-11 DIAGNOSIS — Z992 Dependence on renal dialysis: Secondary | ICD-10-CM | POA: Diagnosis not present

## 2022-08-11 DIAGNOSIS — N186 End stage renal disease: Secondary | ICD-10-CM | POA: Diagnosis not present

## 2022-08-15 DIAGNOSIS — Z992 Dependence on renal dialysis: Secondary | ICD-10-CM | POA: Diagnosis not present

## 2022-08-15 DIAGNOSIS — N186 End stage renal disease: Secondary | ICD-10-CM | POA: Diagnosis not present

## 2022-08-18 DIAGNOSIS — Z992 Dependence on renal dialysis: Secondary | ICD-10-CM | POA: Diagnosis not present

## 2022-08-18 DIAGNOSIS — N186 End stage renal disease: Secondary | ICD-10-CM | POA: Diagnosis not present

## 2022-08-22 DIAGNOSIS — Z992 Dependence on renal dialysis: Secondary | ICD-10-CM | POA: Diagnosis not present

## 2022-08-22 DIAGNOSIS — N186 End stage renal disease: Secondary | ICD-10-CM | POA: Diagnosis not present

## 2022-08-25 DIAGNOSIS — Z992 Dependence on renal dialysis: Secondary | ICD-10-CM | POA: Diagnosis not present

## 2022-08-25 DIAGNOSIS — N186 End stage renal disease: Secondary | ICD-10-CM | POA: Diagnosis not present

## 2022-08-29 DIAGNOSIS — N186 End stage renal disease: Secondary | ICD-10-CM | POA: Diagnosis not present

## 2022-08-29 DIAGNOSIS — Z992 Dependence on renal dialysis: Secondary | ICD-10-CM | POA: Diagnosis not present

## 2022-09-01 DIAGNOSIS — Z992 Dependence on renal dialysis: Secondary | ICD-10-CM | POA: Diagnosis not present

## 2022-09-01 DIAGNOSIS — N186 End stage renal disease: Secondary | ICD-10-CM | POA: Diagnosis not present

## 2022-09-05 DIAGNOSIS — N186 End stage renal disease: Secondary | ICD-10-CM | POA: Diagnosis not present

## 2022-09-05 DIAGNOSIS — Z992 Dependence on renal dialysis: Secondary | ICD-10-CM | POA: Diagnosis not present

## 2022-09-08 DIAGNOSIS — N186 End stage renal disease: Secondary | ICD-10-CM | POA: Diagnosis not present

## 2022-09-08 DIAGNOSIS — Z992 Dependence on renal dialysis: Secondary | ICD-10-CM | POA: Diagnosis not present

## 2022-09-10 DIAGNOSIS — N186 End stage renal disease: Secondary | ICD-10-CM | POA: Diagnosis not present

## 2022-09-10 DIAGNOSIS — Z992 Dependence on renal dialysis: Secondary | ICD-10-CM | POA: Diagnosis not present

## 2022-09-12 DIAGNOSIS — Z992 Dependence on renal dialysis: Secondary | ICD-10-CM | POA: Diagnosis not present

## 2022-09-12 DIAGNOSIS — N186 End stage renal disease: Secondary | ICD-10-CM | POA: Diagnosis not present

## 2022-09-15 DIAGNOSIS — N186 End stage renal disease: Secondary | ICD-10-CM | POA: Diagnosis not present

## 2022-09-15 DIAGNOSIS — Z992 Dependence on renal dialysis: Secondary | ICD-10-CM | POA: Diagnosis not present

## 2022-09-19 DIAGNOSIS — Z992 Dependence on renal dialysis: Secondary | ICD-10-CM | POA: Diagnosis not present

## 2022-09-19 DIAGNOSIS — N186 End stage renal disease: Secondary | ICD-10-CM | POA: Diagnosis not present

## 2022-09-22 DIAGNOSIS — Z992 Dependence on renal dialysis: Secondary | ICD-10-CM | POA: Diagnosis not present

## 2022-09-22 DIAGNOSIS — N186 End stage renal disease: Secondary | ICD-10-CM | POA: Diagnosis not present

## 2022-09-26 DIAGNOSIS — Z992 Dependence on renal dialysis: Secondary | ICD-10-CM | POA: Diagnosis not present

## 2022-09-26 DIAGNOSIS — N186 End stage renal disease: Secondary | ICD-10-CM | POA: Diagnosis not present

## 2022-09-29 ENCOUNTER — Other Ambulatory Visit: Payer: 59

## 2022-09-29 DIAGNOSIS — N186 End stage renal disease: Secondary | ICD-10-CM | POA: Diagnosis not present

## 2022-09-29 DIAGNOSIS — Z992 Dependence on renal dialysis: Secondary | ICD-10-CM | POA: Diagnosis not present

## 2022-10-02 ENCOUNTER — Other Ambulatory Visit: Payer: 59

## 2022-10-02 DIAGNOSIS — N186 End stage renal disease: Secondary | ICD-10-CM | POA: Diagnosis not present

## 2022-10-02 DIAGNOSIS — Z992 Dependence on renal dialysis: Secondary | ICD-10-CM | POA: Diagnosis not present

## 2022-10-02 DIAGNOSIS — E782 Mixed hyperlipidemia: Secondary | ICD-10-CM | POA: Diagnosis not present

## 2022-10-03 DIAGNOSIS — Z992 Dependence on renal dialysis: Secondary | ICD-10-CM | POA: Diagnosis not present

## 2022-10-03 DIAGNOSIS — N186 End stage renal disease: Secondary | ICD-10-CM | POA: Diagnosis not present

## 2022-10-04 ENCOUNTER — Other Ambulatory Visit: Payer: Self-pay | Admitting: Physician Assistant

## 2022-10-06 ENCOUNTER — Ambulatory Visit: Payer: 59 | Admitting: Urology

## 2022-10-06 DIAGNOSIS — N186 End stage renal disease: Secondary | ICD-10-CM | POA: Diagnosis not present

## 2022-10-06 DIAGNOSIS — Z992 Dependence on renal dialysis: Secondary | ICD-10-CM | POA: Diagnosis not present

## 2022-10-08 DIAGNOSIS — I2510 Atherosclerotic heart disease of native coronary artery without angina pectoris: Secondary | ICD-10-CM | POA: Diagnosis not present

## 2022-10-08 DIAGNOSIS — Z992 Dependence on renal dialysis: Secondary | ICD-10-CM | POA: Diagnosis not present

## 2022-10-08 DIAGNOSIS — M545 Low back pain, unspecified: Secondary | ICD-10-CM | POA: Diagnosis not present

## 2022-10-08 DIAGNOSIS — E211 Secondary hyperparathyroidism, not elsewhere classified: Secondary | ICD-10-CM | POA: Diagnosis not present

## 2022-10-08 DIAGNOSIS — N186 End stage renal disease: Secondary | ICD-10-CM | POA: Diagnosis not present

## 2022-10-08 DIAGNOSIS — K219 Gastro-esophageal reflux disease without esophagitis: Secondary | ICD-10-CM | POA: Diagnosis not present

## 2022-10-08 DIAGNOSIS — E782 Mixed hyperlipidemia: Secondary | ICD-10-CM | POA: Diagnosis not present

## 2022-10-08 DIAGNOSIS — I714 Abdominal aortic aneurysm, without rupture, unspecified: Secondary | ICD-10-CM | POA: Diagnosis not present

## 2022-10-08 DIAGNOSIS — I120 Hypertensive chronic kidney disease with stage 5 chronic kidney disease or end stage renal disease: Secondary | ICD-10-CM | POA: Diagnosis not present

## 2022-10-08 DIAGNOSIS — D649 Anemia, unspecified: Secondary | ICD-10-CM | POA: Diagnosis not present

## 2022-10-10 DIAGNOSIS — Z992 Dependence on renal dialysis: Secondary | ICD-10-CM | POA: Diagnosis not present

## 2022-10-10 DIAGNOSIS — N186 End stage renal disease: Secondary | ICD-10-CM | POA: Diagnosis not present

## 2022-10-11 DIAGNOSIS — N186 End stage renal disease: Secondary | ICD-10-CM | POA: Diagnosis not present

## 2022-10-11 DIAGNOSIS — Z992 Dependence on renal dialysis: Secondary | ICD-10-CM | POA: Diagnosis not present

## 2022-10-13 DIAGNOSIS — N186 End stage renal disease: Secondary | ICD-10-CM | POA: Diagnosis not present

## 2022-10-13 DIAGNOSIS — Z992 Dependence on renal dialysis: Secondary | ICD-10-CM | POA: Diagnosis not present

## 2022-10-17 DIAGNOSIS — Z992 Dependence on renal dialysis: Secondary | ICD-10-CM | POA: Diagnosis not present

## 2022-10-17 DIAGNOSIS — N186 End stage renal disease: Secondary | ICD-10-CM | POA: Diagnosis not present

## 2022-10-21 DIAGNOSIS — Z992 Dependence on renal dialysis: Secondary | ICD-10-CM | POA: Diagnosis not present

## 2022-10-21 DIAGNOSIS — N186 End stage renal disease: Secondary | ICD-10-CM | POA: Diagnosis not present

## 2022-10-23 ENCOUNTER — Other Ambulatory Visit: Payer: 59

## 2022-10-23 DIAGNOSIS — C61 Malignant neoplasm of prostate: Secondary | ICD-10-CM

## 2022-10-24 DIAGNOSIS — N186 End stage renal disease: Secondary | ICD-10-CM | POA: Diagnosis not present

## 2022-10-24 DIAGNOSIS — Z992 Dependence on renal dialysis: Secondary | ICD-10-CM | POA: Diagnosis not present

## 2022-10-24 LAB — PSA: Prostate Specific Ag, Serum: 2.6 ng/mL (ref 0.0–4.0)

## 2022-10-27 DIAGNOSIS — Z992 Dependence on renal dialysis: Secondary | ICD-10-CM | POA: Diagnosis not present

## 2022-10-27 DIAGNOSIS — N186 End stage renal disease: Secondary | ICD-10-CM | POA: Diagnosis not present

## 2022-10-29 ENCOUNTER — Ambulatory Visit: Payer: 59 | Admitting: Urology

## 2022-10-29 VITALS — BP 173/69 | HR 101

## 2022-10-29 DIAGNOSIS — R3912 Poor urinary stream: Secondary | ICD-10-CM

## 2022-10-29 DIAGNOSIS — C61 Malignant neoplasm of prostate: Secondary | ICD-10-CM

## 2022-10-29 DIAGNOSIS — N401 Enlarged prostate with lower urinary tract symptoms: Secondary | ICD-10-CM

## 2022-10-29 DIAGNOSIS — N138 Other obstructive and reflux uropathy: Secondary | ICD-10-CM

## 2022-10-29 LAB — URINALYSIS, ROUTINE W REFLEX MICROSCOPIC
Bilirubin, UA: NEGATIVE
Ketones, UA: NEGATIVE
Leukocytes,UA: NEGATIVE
Nitrite, UA: NEGATIVE
Specific Gravity, UA: 1.02 (ref 1.005–1.030)
Urobilinogen, Ur: 0.2 mg/dL (ref 0.2–1.0)
pH, UA: 8 — ABNORMAL HIGH (ref 5.0–7.5)

## 2022-10-29 LAB — MICROSCOPIC EXAMINATION
Bacteria, UA: NONE SEEN
WBC, UA: NONE SEEN /HPF (ref 0–5)

## 2022-10-29 NOTE — Progress Notes (Signed)
10/29/2022 9:06 AM   Elenore Rota Hoerner Jan 10, 1941 [id number]  Referring provider: [name], MD [address] [place],  [place] [address]  Followup prostate cancer and BPH   HPI: Mr Sandusky is a 82yo here for followup for BPH and prostate cancer. PSA increased to 2.6 from 1.7.  IPSS 2 QOl 0 on flomax 0.4mg  daily. Urine stream strong. No straining to urinate. Nocturia 1x.   PMH: Past Medical History:  Diagnosis Date   GERD (gastroesophageal reflux disease)    Glaucoma    Hypertension    Macular degeneration    Myocardial infarction [facility])    1999   Prostate cancer (HCC) 02/2018   Renal insufficiency    Stroke [facility])    no deficits; had stroke while trying to place "stents in legs".    Surgical History: Past Surgical History:  Procedure Laterality Date   ABDOMINAL AORTIC ANEURYSM REPAIR  2012   IN [place]   APPENDECTOMY     AV FISTULA PLACEMENT Left 10/16/2020   Procedure: LEFT ARM ARTERIOVENOUS (AV) FISTULA CREATION;  Surgeon: Larina Earthly, MD;  Location: [facility];  Service: Vascular;  Laterality: Left;   CORONARY ARTERY BYPASS GRAFT  01/1998   3 vessels 18 years ago   INGUINAL HERNIA REPAIR Right 09/20/2015   Procedure: REPAIR RIGHT INGUINAL HERNIA  WITH MESH;  Surgeon: Ancil Linsey, MD;  Location: [facility];  Service: General;  Laterality: Right;   INSERTION OF DIALYSIS CATHETER Right 08/02/2020   Procedure: INSERTION OF RIGHT TUNNELED INTERNAL JUGULAR  DIALYSIS CATHETER;  Surgeon: Larina Earthly, MD;  Location: [facility];  Service: Vascular;  Laterality: Right;   REMOVAL OF A DIALYSIS CATHETER N/A 03/19/2021   Procedure: MINOR REMOVAL OF A TUNNELED DIALYSIS CATHETER;  Surgeon: Larina Earthly, MD;  Location: [facility];  Service: Vascular;  Laterality: N/A;   tumor removal from bladder      Home Medications:  Allergies as of 10/29/2022   No Known Allergies      Medication List        Accurate as of October 29, 2022  9:06 AM. If you have any questions,  ask your nurse or doctor.          atorvastatin 80 MG tablet Commonly known as: LIPITOR Take 80 mg by mouth in the morning.   brimonidine 0.2 % ophthalmic solution Commonly known as: ALPHAGAN Place 1 drop into the right eye 2 (two) times daily.   carvedilol 12.5 MG tablet Commonly known as: COREG Take 6.25 mg by mouth 2 (two) times daily with a meal.   dorzolamide-timolol 2-0.5 % ophthalmic solution Commonly known as: COSOPT Place 1 drop into the right eye 2 (two) times daily.   magnesium oxide 400 MG tablet Commonly known as: MAG-OX Take 400 mg by mouth every evening.   memantine 5 MG tablet Commonly known as: NAMENDA Take 1 tablet (5 mg at night) for 2 weeks, then increase to 1 tablet (5 mg) twice a day   tamsulosin 0.4 MG Caps capsule Commonly known as: FLOMAX Take 1 capsule (0.4 mg total) by mouth daily after supper.   traMADol 50 MG tablet Commonly known as: ULTRAM Take 50 mg by mouth 3 (three) times daily as needed (pain.).   zolpidem 10 MG tablet Commonly known as: AMBIEN Take 10 mg by mouth at bedtime.        Allergies: No Known Allergies  Family History: Family History  Problem Relation Age of Onset  Cancer Father     Social History:  reports that he has quit smoking. His smoking use included cigarettes. He started smoking about 65 years ago. He has a 32.6 pack-year smoking history. He has never used smokeless tobacco. He reports that he does not drink alcohol and does not use drugs.  ROS: All other review of systems were reviewed and are negative except what is noted above in HPI  Physical Exam: There were no vitals taken for this visit.  Constitutional:  Alert and oriented, No acute distress. HEENT: [place] AT, moist mucus membranes.  Trachea midline, no masses. Cardiovascular: No clubbing, cyanosis, or edema. Respiratory: Normal respiratory effort, no increased work of breathing. GI: Abdomen is soft, nontender, nondistended, no abdominal  masses GU: No CVA tenderness.  Lymph: No cervical or inguinal lymphadenopathy. Skin: No rashes, bruises or suspicious lesions. Neurologic: Grossly intact, no focal deficits, moving all 4 extremities. Psychiatric: Normal mood and affect.  Laboratory Data: Lab Results  Component Value Date   WBC 4.7 08/10/2019   HGB 11.9 (L) 10/16/2020   HCT 35.0 (L) 10/16/2020   MCV 90.4 08/10/2019   PLT 160 08/10/2019    Lab Results  Component Value Date   CREATININE 7.20 (H) 03/25/2022    Lab Results  Component Value Date   PSA <0.1 03/15/2019    Lab Results  Component Value Date   TESTOSTERONE 124 (L) 03/26/2020    No results found for: "HGBA1C"  Urinalysis    Component Value Date/Time   APPEARANCEUR Clear 04/07/2022 1136   GLUCOSEU Negative 04/07/2022 1136   BILIRUBINUR Negative 04/07/2022 1136   PROTEINUR 3+ (A) 04/07/2022 1136   UROBILINOGEN 0.2 05/17/2019 0917   NITRITE Negative 04/07/2022 1136   LEUKOCYTESUR Negative 04/07/2022 1136    Lab Results  Component Value Date   LABMICR See below: 04/07/2022   WBCUA 0-5 04/07/2022   LABEPIT 0-10 04/07/2022   MUCUS Present 06/14/2021   BACTERIA None seen 04/07/2022    Pertinent Imaging:  Results for orders placed during the hospital encounter of 03/29/18  DG Abd 1 View  Narrative CLINICAL DATA:  Constipation for a few weeks.  EXAM: ABDOMEN - 1 VIEW  COMPARISON:  None.  FINDINGS: Normal bowel gas pattern without evidence of obstruction.  Mild generalized increased colonic stool burden.  No evidence of renal or ureteral stones.  Aortic stent graft extends from the lower aspect of L2 to common iliac artery arms over the upper sacrum.  Skeletal structures intact.  IMPRESSION: 1. No acute findings.  No evidence of bowel obstruction. 2. Mild generalized increased colonic stool burden.   Electronically Signed By: Amie Portland M.D. On: 03/30/2018 08:52  No results found for this or any previous  visit.  No results found for this or any previous visit.  No results found for this or any previous visit.  Results for orders placed during the hospital encounter of 07/14/16  US Renal  Narrative CLINICAL DATA:  Protein urea, chronic renal insufficiency, assess postvoid residual volume  EXAM: RENAL / URINARY TRACT ULTRASOUND COMPLETE  COMPARISON:  Renal ultrasound of December 22, 2014  FINDINGS: Right Kidney:  Length: 12.2 cm. The renal cortical echotexture is somewhat greater than that of the adjacent liver. Multiple cysts of present 2.7 in diameter. There is no hydronephrosis.  Left Kidney:  Length: 11.3 cm. The renal cortical echotexture is similar to that of the right kidney. There are multiple cystic structures measuring up to 1.4 cm in diameter. There is no hydronephrosis.  Bladder:  Urinary bladder exhibits diffuse wall thickening. The prostate gland produces a prominent impression upon the bladder base. The prevoid volume was 187 cc. The postvoid volume is 28 cc.  IMPRESSION: Multiple simple appearing renal cysts more conspicuous than on the previous study. Increased renal cortical echotexture consistent with medical renal disease. No hydronephrosis.  Thickened gallbladder wall.  28 cc postvoid residual volume.   Electronically Signed By: David  [place] M.D. On: 07/14/2016 11:40  No valid procedures specified. No results found for this or any previous visit.  No results found for this or any previous visit.   Assessment & Plan:    1. Prostate cancer (HCC) -PSMA PET  2. Benign prostatic hyperplasia with urinary obstruction -continue flomax 0.4mg  daily  3. Weak urinary stream Continue flomax 0.4mg  daily   No follow-ups on file.  Wilkie Aye, MD  [facility] [place]

## 2022-10-31 DIAGNOSIS — Z992 Dependence on renal dialysis: Secondary | ICD-10-CM | POA: Diagnosis not present

## 2022-10-31 DIAGNOSIS — N186 End stage renal disease: Secondary | ICD-10-CM | POA: Diagnosis not present

## 2022-11-03 DIAGNOSIS — Z992 Dependence on renal dialysis: Secondary | ICD-10-CM | POA: Diagnosis not present

## 2022-11-03 DIAGNOSIS — N186 End stage renal disease: Secondary | ICD-10-CM | POA: Diagnosis not present

## 2022-11-04 ENCOUNTER — Encounter: Payer: Self-pay | Admitting: Urology

## 2022-11-04 MED ORDER — TAMSULOSIN HCL 0.4 MG PO CAPS: 0.4000 mg | ORAL_CAPSULE | Freq: Every day | ORAL | 3 refills | Status: DC

## 2022-11-04 NOTE — Patient Instructions (Signed)

## 2022-11-06 ENCOUNTER — Encounter (HOSPITAL_COMMUNITY): Admission: RE | Admit: 2022-11-06 | Payer: 59 | Source: Ambulatory Visit

## 2022-11-07 ENCOUNTER — Emergency Department (HOSPITAL_COMMUNITY)
Admission: EM | Admit: 2022-11-07 | Discharge: 2022-11-07 | Discharge status: Against Medical Advice | Payer: 59 | Attending: Emergency Medicine | Admitting: Emergency Medicine

## 2022-11-07 ENCOUNTER — Emergency Department (HOSPITAL_COMMUNITY): Payer: 59

## 2022-11-07 ENCOUNTER — Other Ambulatory Visit: Payer: Self-pay

## 2022-11-07 ENCOUNTER — Encounter (HOSPITAL_COMMUNITY): Payer: Self-pay | Admitting: *Deleted

## 2022-11-07 DIAGNOSIS — R0600 Dyspnea, unspecified: Secondary | ICD-10-CM | POA: Insufficient documentation

## 2022-11-07 DIAGNOSIS — Z992 Dependence on renal dialysis: Secondary | ICD-10-CM | POA: Insufficient documentation

## 2022-11-07 DIAGNOSIS — J9811 Atelectasis: Secondary | ICD-10-CM | POA: Diagnosis not present

## 2022-11-07 DIAGNOSIS — R0789 Other chest pain: Secondary | ICD-10-CM | POA: Insufficient documentation

## 2022-11-07 DIAGNOSIS — R079 Chest pain, unspecified: Secondary | ICD-10-CM

## 2022-11-07 DIAGNOSIS — N186 End stage renal disease: Secondary | ICD-10-CM | POA: Diagnosis not present

## 2022-11-07 DIAGNOSIS — E8770 Fluid overload, unspecified: Secondary | ICD-10-CM | POA: Insufficient documentation

## 2022-11-07 DIAGNOSIS — Z8546 Personal history of malignant neoplasm of prostate: Secondary | ICD-10-CM | POA: Insufficient documentation

## 2022-11-07 DIAGNOSIS — I517 Cardiomegaly: Secondary | ICD-10-CM | POA: Diagnosis not present

## 2022-11-07 DIAGNOSIS — R0602 Shortness of breath: Secondary | ICD-10-CM | POA: Diagnosis not present

## 2022-11-07 LAB — BASIC METABOLIC PANEL
Anion gap: 17 — ABNORMAL HIGH (ref 5–15)
BUN: 45 mg/dL — ABNORMAL HIGH (ref 8–23)
CO2: 22 mmol/L (ref 22–32)
Calcium: 9.2 mg/dL (ref 8.9–10.3)
Chloride: 96 mmol/L — ABNORMAL LOW (ref 98–111)
Creatinine, Ser: 7.73 mg/dL — ABNORMAL HIGH (ref 0.61–1.24)
GFR, Estimated: 6 mL/min — ABNORMAL LOW (ref >60)
Glucose, Bld: 107 mg/dL — ABNORMAL HIGH (ref 70–99)
Potassium: 4.1 mmol/L (ref 3.5–5.1)
Sodium: 135 mmol/L (ref 135–145)

## 2022-11-07 LAB — CBC
HCT: 39.2 % (ref 39.0–52.0)
Hemoglobin: 12.8 g/dL — ABNORMAL LOW (ref 13.0–17.0)
MCH: 31.5 pg (ref 26.0–34.0)
MCHC: 32.7 g/dL (ref 30.0–36.0)
MCV: 96.6 fL (ref 80.0–100.0)
Platelets: 190 10*3/uL (ref 150–400)
RBC: 4.06 MIL/uL — ABNORMAL LOW (ref 4.22–5.81)
RDW: 15.3 % (ref 11.5–15.5)
WBC: 6.4 10*3/uL (ref 4.0–10.5)
nRBC: 0 % (ref 0.0–0.2)

## 2022-11-07 LAB — TROPONIN I (HIGH SENSITIVITY)
Troponin I (High Sensitivity): 50 ng/L — ABNORMAL HIGH (ref <18)
Troponin I (High Sensitivity): 45 ng/L — ABNORMAL HIGH (ref <18)

## 2022-11-07 LAB — BRAIN NATRIURETIC PEPTIDE: B Natriuretic Peptide: 1655 pg/mL — ABNORMAL HIGH (ref 0.0–100.0)

## 2022-11-07 NOTE — ED Notes (Signed)
Patient denies chest pain at this time 

## 2022-11-07 NOTE — ED Notes (Signed)
Patient insists on leaving AMA. ED provider Excell Seltzer, PA-C at patient bedside.

## 2022-11-07 NOTE — ED Notes (Signed)
Patient ambulated to restroom with no assistance.  

## 2022-11-07 NOTE — ED Triage Notes (Signed)
Pt with CP and SOB unknown for how long, pt with known 12 cm aortic aneurysm. Pt is a dialysis patient as well.  Friend brought pt in.

## 2022-11-07 NOTE — ED Provider Notes (Signed)
[place] EMERGENCY DEPARTMENT AT [facility] Provider Note   CSN: [id number] Arrival date & time: 11/07/22  1348     History  Chief Complaint  Patient presents with   Chest Pain    Ronald [name] is a 82 y.o. male.   Chest Pain   82 year old male presents emergency department with complaints of chest pain and shortness of breath.  Patient experiencing symptoms for the past 2 to 3 days.  Reports left sided chest pain as pressure.  Describes pain as well as shortness of breath worsened with laying flat.  Patient with history of ESRD on hemodialysis receiving dialysis only on Mondays and Fridays with most recent session on Monday.  States he did not go to dialysis today due to not feeling well.  States he is also had cough that has been present for the past 2 to 3 days.  Denies any fever, abdominal pain, nausea, vomit, urinary symptoms, change in bowel habits.  Has been compliant with his at home medications.  Past medical history significant for prostate cancer, GERD, glaucoma, MI, CVA, ESRD on hemodialysis, AAA  Home Medications Prior to Admission medications   Medication Sig Start Date End Date Taking? Authorizing Provider  amLODipine (NORVASC) 5 MG tablet Take 5 mg by mouth at bedtime.    [provider]  atorvastatin (LIPITOR) 80 MG tablet Take 80 mg by mouth in the morning. 07/05/19   [provider]  brimonidine (ALPHAGAN) 0.2 % ophthalmic solution Place 1 drop into the right eye 2 (two) times daily. 03/04/22   Rennis Chris, MD  calcium acetate (PHOSLO) 667 MG capsule Take 1,334 mg by mouth 3 (three) times daily.    [provider]  carvedilol (COREG) 12.5 MG tablet Take 6.25 mg by mouth 2 (two) times daily with a meal.    [provider]  Cholecalciferol 250 MCG (10000 UT) CAPS Take 1 capsule by mouth once a week. 07/04/19   [provider]  dorzolamide-timolol (COSOPT) 2-0.5 % ophthalmic solution Place 1 drop into the  right eye 2 (two) times daily. 03/04/22   Rennis Chris, MD  hydrALAZINE (APRESOLINE) 25 MG tablet Take 25 mg by mouth in the morning and at bedtime. 02/16/20   [provider]  leuprolide (LUPRON DEPOT, 29-MONTH,) 30 MG injection Inject 30 mg into the muscle every 4 (four) months. Via urology. 09/24/17   [provider]  loperamide (IMODIUM A-D) 2 MG tablet Take 2 mg by mouth every 4 (four) hours as needed for diarrhea or loose stools. 04/06/21   [provider]  magnesium oxide (MAG-OX) 400 MG tablet Take 400 mg by mouth every evening.    [provider]  memantine (NAMENDA) 5 MG tablet Take 1 tablet (5 mg at night) for 2 weeks, then increase to 1 tablet (5 mg) twice a day 08/08/21   Marcos Eke, PA-C  tamsulosin (FLOMAX) 0.4 MG CAPS capsule Take 1 capsule (0.4 mg total) by mouth daily after supper. 11/04/22   McKenzie, Mardene Celeste, MD  traMADol (ULTRAM) 50 MG tablet Take 50 mg by mouth 3 (three) times daily as needed (pain.).    [provider]  zolpidem (AMBIEN) 10 MG tablet Take 10 mg by mouth at bedtime.    [provider]      Allergies    Patient has no known allergies.    Review of Systems   Review of Systems  Cardiovascular:  Positive for chest pain.  All other systems  reviewed and are negative.   Physical Exam Updated Vital Signs BP (!) 184/121   Pulse 92   Temp 97.9 F (36.6 C) (Oral)   Resp (!) 25   Ht 6\' 2"  (1.88 m)   Wt 94.3 kg   SpO2 94%   BMI 26.71 kg/m  Physical Exam Vitals and nursing note reviewed.  Constitutional:      General: He is not in acute distress.    Appearance: He is well-developed.  HENT:     Head: Normocephalic and atraumatic.  Eyes:     Conjunctiva/sclera: Conjunctivae normal.  Cardiovascular:     Rate and Rhythm: Normal rate and regular rhythm.  Pulmonary:     Effort: No respiratory distress.     Breath sounds: Rales present.     Comments: Patient tachypneic Abdominal:     Palpations:  Abdomen is soft.     Tenderness: There is no abdominal tenderness. There is no guarding.  Musculoskeletal:     Cervical back: Neck supple.  Skin:    General: Skin is warm and dry.     Capillary Refill: Capillary refill takes less than 2 seconds.  Neurological:     Mental Status: He is alert.  Psychiatric:        Mood and Affect: Mood normal.     ED Results / Procedures / Treatments   Labs (all labs ordered are listed, but only abnormal results are displayed) Labs Reviewed  BASIC METABOLIC PANEL - Abnormal; Notable for the following components:      Result Value   Chloride 96 (*)    Glucose, Bld 107 (*)    BUN 45 (*)    Creatinine, Ser 7.73 (*)    GFR, Estimated 6 (*)    Anion gap 17 (*)    All other components within normal limits  CBC - Abnormal; Notable for the following components:   RBC 4.06 (*)    Hemoglobin 12.8 (*)    All other components within normal limits  BRAIN NATRIURETIC PEPTIDE - Abnormal; Notable for the following components:   B Natriuretic Peptide 1,655.0 (*)    All other components within normal limits  TROPONIN I (HIGH SENSITIVITY) - Abnormal; Notable for the following components:   Troponin I (High Sensitivity) 50 (*)    All other components within normal limits  TROPONIN I (HIGH SENSITIVITY) - Abnormal; Notable for the following components:   Troponin I (High Sensitivity) 45 (*)    All other components within normal limits    EKG EKG Interpretation Date/Time:  Friday November 07 2022 14:05:22 EDT Ventricular Rate:  101 PR Interval:    QRS Duration:  162 QT Interval:  414 QTC Calculation: 537 R Axis:   -75  Text Interpretation: Intraventricular conduction delay Ventricular bigeminy Right bundle branch block LVH with IVCD and secondary repol abnrm Inferior infarct, acute Lateral leads are also involved Prolonged QT interval Confirmed by Vanetta Mulders [phone]) on 11/07/2022 4:33:45 PM  Radiology DG Chest Portable 1 View  Result Date:  11/07/2022 CLINICAL DATA:  Chest pain, shortness of breath. EXAM: PORTABLE CHEST 1 VIEW COMPARISON:  August 02, 2020. FINDINGS: Stable cardiomegaly. Hypoinflation of the lungs is noted with minimal bibasilar subsegmental atelectasis. Sternotomy wires are noted. Bony thorax is unremarkable. IMPRESSION: Hypoinflation of the lungs with minimal bibasilar subsegmental atelectasis. Electronically Signed   By: Lupita Raider M.D.   On: 11/07/2022 16:13    Procedures Procedures    Medications Ordered in ED Medications - No data to  display  ED Course/ Medical Decision Making/ A&P Clinical Course as of 11/07/22 1730  Fri Nov 07, 2022  1722 While awaiting for consult for nephrology for more urgent dialysis, patient decided to leave AGAINST MEDICAL ADVICE. [CR]    Clinical Course User Index [CR] Peter Garter, PA                                 Medical Decision Making Amount and/or Complexity of Data Reviewed Labs: ordered. Radiology: ordered.   This patient presents to the ED for concern of chest pain, this involves an extensive number of treatment options, and is a complaint that carries with it a high risk of complications and morbidity.  The differential diagnosis includes ACS, PE, pneumothorax, CHF, pericarditis/myocarditis/tamponade, aortic dissection/aneurysm, GERD, other   Co morbidities that complicate the patient evaluation  See HPI   Additional history obtained:  Additional history obtained from EMR External records from outside source obtained and reviewed including hospital records   Lab Tests:  I Ordered, and personally interpreted labs.  The pertinent results include: No leukocytosis.  Mild evidence of anemia the hemoglobin 12.8.  Placed within range.  Patient with elevated creatinine as well as BUN of 7.73 as well as BUN of 45 with GFR of 6.  Mild hypochloremia of 96.  Troponin initially 50 with repeat 45.  BNP 1655   Imaging Studies ordered:  I ordered imaging  studies including chest x-ray I independently visualized and interpreted imaging which showed bibasilar atelectasis.  Hyperinflation. I agree with the radiologist interpretation   Cardiac Monitoring: / EKG:  The patient was maintained on a cardiac monitor.  I personally viewed and interpreted the cardiac monitored which showed an underlying rhythm of: Ventricular bigeminy, RBBB, LVH prolonged QT, ST changes  Consultations Obtained:  Consulted with attending physician Dr. Deretha Emory who recommended consultation with nephrology for dialysis.  Problem List / ED Course / Critical interventions / Medication management  Volume overload, chest pain, shortness of breath Reevaluation of the patient showed that the patient stayed the same I have reviewed the patients home medicines and have made adjustments as needed   Social Determinants of Health:  Former cigarette use.  Denies illicit drug use.   Test / Admission - Considered:  Volume overload, chest pain, shortness of breath Vitals signs significant for hypertension blood pressure 184/121, tachypneic with respirate of 25. Otherwise within normal range and stable throughout visit. Laboratory/imaging studies significant for: See above 82 year old male presents emergency department with complaints of left-sided chest pain, shortness of breath for the past 2 to 3 days.  Chest pain as well as shortness of breath exertional related as well as positionally related.  On exam, patient with evidence of volume overload with rales auscultated bilateral lung fields, lower extremity edema as well as BNP elevation of greater than 1600.  Given findings as well as patient missing dialysis since Monday, desired to consult nephrology for urgent dialysis but while waiting, patient decided to leave AGAINST MEDICAL ADVICE.  I discussed the patient regarding increased risk of morbidity/mortality if leaving AGAINST MEDICAL ADVICE and he acknowledged understanding and  still decided to leave.  Will recommend follow-up with dialysis center as soon as able for dialysis session.        Final Clinical Impression(s) / ED Diagnoses Final diagnoses:  Hypervolemia, unspecified hypervolemia type  Chest pain, unspecified type  Dyspnea, unspecified type    Rx / DC  Orders ED Discharge Orders     None         Peter Garter, [place] 11/07/22 1730    Vanetta Mulders, MD 11/08/22 [phone]

## 2022-11-07 NOTE — ED Notes (Signed)
Patient is stable at this time; Patient denies pain, is alert and oriented x4, and resting comfortably at this time. Provider aware patient is waiting to be evaluated.

## 2022-11-07 NOTE — ED Notes (Signed)
ED Provider at bedside. 

## 2022-11-07 NOTE — ED Notes (Signed)
Xray tech at patient bedside with portable Xray.

## 2022-11-08 ENCOUNTER — Encounter (HOSPITAL_COMMUNITY): Payer: Self-pay | Admitting: *Deleted

## 2022-11-08 ENCOUNTER — Emergency Department (HOSPITAL_COMMUNITY)
Admission: EM | Admit: 2022-11-08 | Discharge: 2022-11-08 | Discharge status: Against Medical Advice | Payer: 59 | Attending: Emergency Medicine | Admitting: Emergency Medicine

## 2022-11-08 ENCOUNTER — Other Ambulatory Visit: Payer: Self-pay

## 2022-11-08 ENCOUNTER — Telehealth: Payer: Self-pay | Admitting: Cardiology

## 2022-11-08 DIAGNOSIS — Z5321 Procedure and treatment not carried out due to patient leaving prior to being seen by health care provider: Secondary | ICD-10-CM | POA: Diagnosis not present

## 2022-11-08 DIAGNOSIS — R0602 Shortness of breath: Secondary | ICD-10-CM | POA: Insufficient documentation

## 2022-11-08 NOTE — ED Triage Notes (Addendum)
Pt returns after leaving AMA last night from ED. Pt verbalized was instructed to return for HD. Pt d/w HD RN. HD has no orders and is unfamiliar with plans for pt.

## 2022-11-08 NOTE — Telephone Encounter (Signed)
Outpatient service line: Chief complaint: CHF exacerbation  Patient was seen yesterday at the emergency room found to be overtly volume up and had left AMA.  He was reported that he had missed a dialysis session.  He called today with continued symptoms of shortness of breath, cough, chest pain, orthopnea.  Discussed with patient and wife in much detail the need for an emergency evaluation.  Very reluctant to go given unpleasant experience yesterday.  Reiterated my concerns and recommended emergency evaluation.  They are agreeable to this now.  Will forward this message to primary MD for further follow-up.

## 2022-11-08 NOTE — ED Triage Notes (Signed)
Pt with SOB with laying flat.  Been few days ago since last dialysis and wants dialysis done here today.

## 2022-11-10 DIAGNOSIS — Z992 Dependence on renal dialysis: Secondary | ICD-10-CM | POA: Diagnosis not present

## 2022-11-10 DIAGNOSIS — N186 End stage renal disease: Secondary | ICD-10-CM | POA: Diagnosis not present

## 2022-11-12 ENCOUNTER — Inpatient Hospital Stay (HOSPITAL_COMMUNITY): Payer: 59

## 2022-11-12 ENCOUNTER — Inpatient Hospital Stay (HOSPITAL_COMMUNITY)
Admission: EM | Admit: 2022-11-12 | Discharge: 2022-11-17 | DRG: 280 | Disposition: A | Discharge status: Home / Self Care | Payer: 59 | Attending: Internal Medicine | Admitting: Internal Medicine

## 2022-11-12 ENCOUNTER — Emergency Department (HOSPITAL_COMMUNITY): Payer: 59

## 2022-11-12 ENCOUNTER — Other Ambulatory Visit: Payer: Self-pay

## 2022-11-12 ENCOUNTER — Encounter (HOSPITAL_COMMUNITY): Payer: Self-pay | Admitting: Emergency Medicine

## 2022-11-12 DIAGNOSIS — Z951 Presence of aortocoronary bypass graft: Secondary | ICD-10-CM

## 2022-11-12 DIAGNOSIS — I2510 Atherosclerotic heart disease of native coronary artery without angina pectoris: Secondary | ICD-10-CM | POA: Diagnosis present

## 2022-11-12 DIAGNOSIS — Z992 Dependence on renal dialysis: Secondary | ICD-10-CM

## 2022-11-12 DIAGNOSIS — F03C18 Unspecified dementia, severe, with other behavioral disturbance: Secondary | ICD-10-CM | POA: Diagnosis not present

## 2022-11-12 DIAGNOSIS — I5021 Acute systolic (congestive) heart failure: Secondary | ICD-10-CM | POA: Diagnosis not present

## 2022-11-12 DIAGNOSIS — R4189 Other symptoms and signs involving cognitive functions and awareness: Secondary | ICD-10-CM | POA: Diagnosis present

## 2022-11-12 DIAGNOSIS — N2581 Secondary hyperparathyroidism of renal origin: Secondary | ICD-10-CM | POA: Diagnosis not present

## 2022-11-12 DIAGNOSIS — Z809 Family history of malignant neoplasm, unspecified: Secondary | ICD-10-CM

## 2022-11-12 DIAGNOSIS — Z8673 Personal history of transient ischemic attack (TIA), and cerebral infarction without residual deficits: Secondary | ICD-10-CM

## 2022-11-12 DIAGNOSIS — I517 Cardiomegaly: Secondary | ICD-10-CM | POA: Diagnosis not present

## 2022-11-12 DIAGNOSIS — Z8679 Personal history of other diseases of the circulatory system: Secondary | ICD-10-CM

## 2022-11-12 DIAGNOSIS — Z66 Do not resuscitate: Secondary | ICD-10-CM | POA: Diagnosis not present

## 2022-11-12 DIAGNOSIS — I714 Abdominal aortic aneurysm, without rupture, unspecified: Secondary | ICD-10-CM | POA: Diagnosis not present

## 2022-11-12 DIAGNOSIS — H409 Unspecified glaucoma: Secondary | ICD-10-CM | POA: Diagnosis present

## 2022-11-12 DIAGNOSIS — Z7982 Long term (current) use of aspirin: Secondary | ICD-10-CM

## 2022-11-12 DIAGNOSIS — M898X9 Other specified disorders of bone, unspecified site: Secondary | ICD-10-CM | POA: Diagnosis present

## 2022-11-12 DIAGNOSIS — Z9889 Other specified postprocedural states: Secondary | ICD-10-CM

## 2022-11-12 DIAGNOSIS — I509 Heart failure, unspecified: Secondary | ICD-10-CM | POA: Diagnosis not present

## 2022-11-12 DIAGNOSIS — I6782 Cerebral ischemia: Secondary | ICD-10-CM | POA: Diagnosis not present

## 2022-11-12 DIAGNOSIS — R000 Tachycardia, unspecified: Secondary | ICD-10-CM | POA: Diagnosis not present

## 2022-11-12 DIAGNOSIS — I10 Essential (primary) hypertension: Secondary | ICD-10-CM | POA: Diagnosis not present

## 2022-11-12 DIAGNOSIS — N250 Renal osteodystrophy: Secondary | ICD-10-CM | POA: Diagnosis not present

## 2022-11-12 DIAGNOSIS — E782 Mixed hyperlipidemia: Secondary | ICD-10-CM | POA: Diagnosis not present

## 2022-11-12 DIAGNOSIS — D631 Anemia in chronic kidney disease: Secondary | ICD-10-CM | POA: Diagnosis present

## 2022-11-12 DIAGNOSIS — Z79899 Other long term (current) drug therapy: Secondary | ICD-10-CM

## 2022-11-12 DIAGNOSIS — I252 Old myocardial infarction: Secondary | ICD-10-CM

## 2022-11-12 DIAGNOSIS — R079 Chest pain, unspecified: Secondary | ICD-10-CM | POA: Diagnosis not present

## 2022-11-12 DIAGNOSIS — F03C11 Unspecified dementia, severe, with agitation: Secondary | ICD-10-CM | POA: Diagnosis present

## 2022-11-12 DIAGNOSIS — K219 Gastro-esophageal reflux disease without esophagitis: Secondary | ICD-10-CM | POA: Diagnosis present

## 2022-11-12 DIAGNOSIS — I4891 Unspecified atrial fibrillation: Secondary | ICD-10-CM | POA: Diagnosis not present

## 2022-11-12 DIAGNOSIS — F05 Delirium due to known physiological condition: Secondary | ICD-10-CM | POA: Diagnosis not present

## 2022-11-12 DIAGNOSIS — N186 End stage renal disease: Secondary | ICD-10-CM | POA: Diagnosis present

## 2022-11-12 DIAGNOSIS — I471 Supraventricular tachycardia, unspecified: Secondary | ICD-10-CM | POA: Diagnosis present

## 2022-11-12 DIAGNOSIS — I4510 Unspecified right bundle-branch block: Secondary | ICD-10-CM | POA: Diagnosis not present

## 2022-11-12 DIAGNOSIS — E7849 Other hyperlipidemia: Secondary | ICD-10-CM | POA: Diagnosis not present

## 2022-11-12 DIAGNOSIS — R0602 Shortness of breath: Secondary | ICD-10-CM | POA: Diagnosis not present

## 2022-11-12 DIAGNOSIS — Z720 Tobacco use: Secondary | ICD-10-CM | POA: Diagnosis present

## 2022-11-12 DIAGNOSIS — I480 Paroxysmal atrial fibrillation: Secondary | ICD-10-CM | POA: Diagnosis not present

## 2022-11-12 DIAGNOSIS — I214 Non-ST elevation (NSTEMI) myocardial infarction: Secondary | ICD-10-CM | POA: Diagnosis not present

## 2022-11-12 DIAGNOSIS — I132 Hypertensive heart and chronic kidney disease with heart failure and with stage 5 chronic kidney disease, or end stage renal disease: Secondary | ICD-10-CM | POA: Diagnosis present

## 2022-11-12 DIAGNOSIS — I429 Cardiomyopathy, unspecified: Secondary | ICD-10-CM | POA: Diagnosis not present

## 2022-11-12 DIAGNOSIS — F17210 Nicotine dependence, cigarettes, uncomplicated: Secondary | ICD-10-CM | POA: Diagnosis present

## 2022-11-12 DIAGNOSIS — Z781 Physical restraint status: Secondary | ICD-10-CM

## 2022-11-12 DIAGNOSIS — Z7901 Long term (current) use of anticoagulants: Secondary | ICD-10-CM

## 2022-11-12 DIAGNOSIS — I499 Cardiac arrhythmia, unspecified: Secondary | ICD-10-CM | POA: Diagnosis not present

## 2022-11-12 DIAGNOSIS — I120 Hypertensive chronic kidney disease with stage 5 chronic kidney disease or end stage renal disease: Secondary | ICD-10-CM | POA: Diagnosis not present

## 2022-11-12 DIAGNOSIS — Z8546 Personal history of malignant neoplasm of prostate: Secondary | ICD-10-CM

## 2022-11-12 DIAGNOSIS — J811 Chronic pulmonary edema: Secondary | ICD-10-CM | POA: Diagnosis not present

## 2022-11-12 LAB — BASIC METABOLIC PANEL
Anion gap: 9 (ref 5–15)
BUN: 19 mg/dL (ref 8–23)
CO2: 29 mmol/L (ref 22–32)
Calcium: 8.3 mg/dL — ABNORMAL LOW (ref 8.9–10.3)
Chloride: 97 mmol/L — ABNORMAL LOW (ref 98–111)
Creatinine, Ser: 4.3 mg/dL — ABNORMAL HIGH (ref 0.61–1.24)
GFR, Estimated: 13 mL/min — ABNORMAL LOW (ref >60)
Glucose, Bld: 128 mg/dL — ABNORMAL HIGH (ref 70–99)
Potassium: 3.7 mmol/L (ref 3.5–5.1)
Sodium: 135 mmol/L (ref 135–145)

## 2022-11-12 LAB — TROPONIN I (HIGH SENSITIVITY)
Troponin I (High Sensitivity): 1125 ng/L (ref <18)
Troponin I (High Sensitivity): 2448 ng/L (ref <18)
Troponin I (High Sensitivity): 9187 ng/L (ref <18)

## 2022-11-12 LAB — CBC
HCT: 34.6 % — ABNORMAL LOW (ref 39.0–52.0)
Hemoglobin: 10.8 g/dL — ABNORMAL LOW (ref 13.0–17.0)
MCH: 30.3 pg (ref 26.0–34.0)
MCHC: 31.2 g/dL (ref 30.0–36.0)
MCV: 97.2 fL (ref 80.0–100.0)
Platelets: 175 10*3/uL (ref 150–400)
RBC: 3.56 MIL/uL — ABNORMAL LOW (ref 4.22–5.81)
RDW: 15.1 % (ref 11.5–15.5)
WBC: 6.7 10*3/uL (ref 4.0–10.5)
nRBC: 0 % (ref 0.0–0.2)

## 2022-11-12 LAB — BRAIN NATRIURETIC PEPTIDE: B Natriuretic Peptide: 1510 pg/mL — ABNORMAL HIGH (ref 0.0–100.0)

## 2022-11-12 LAB — MAGNESIUM: Magnesium: 2 mg/dL (ref 1.7–2.4)

## 2022-11-12 MED ORDER — HEPARIN BOLUS VIA INFUSION
4000.0000 [IU] | Freq: Once | INTRAVENOUS | Status: AC
  Administered 2022-11-12: 4000 [IU] via INTRAVENOUS

## 2022-11-12 MED ORDER — ATORVASTATIN CALCIUM 40 MG PO TABS
80.0000 mg | ORAL_TABLET | Freq: Every morning | ORAL | Status: DC
  Administered 2022-11-13 – 2022-11-17 (×5): 80 mg via ORAL
  Filled 2022-11-12 (×5): qty 2

## 2022-11-12 MED ORDER — TAMSULOSIN HCL 0.4 MG PO CAPS
0.4000 mg | ORAL_CAPSULE | Freq: Every day | ORAL | Status: DC
  Administered 2022-11-14 – 2022-11-16 (×3): 0.4 mg via ORAL
  Filled 2022-11-12 (×3): qty 1

## 2022-11-12 MED ORDER — CALCIUM ACETATE (PHOS BINDER) 667 MG PO CAPS
1334.0000 mg | ORAL_CAPSULE | Freq: Three times a day (TID) | ORAL | Status: DC
  Administered 2022-11-13 – 2022-11-17 (×12): 1334 mg via ORAL
  Filled 2022-11-12 (×13): qty 2

## 2022-11-12 MED ORDER — CHLORHEXIDINE GLUCONATE CLOTH 2 % EX PADS
6.0000 | MEDICATED_PAD | Freq: Every day | CUTANEOUS | Status: DC
  Administered 2022-11-12 – 2022-11-16 (×5): 6 via TOPICAL

## 2022-11-12 MED ORDER — DILTIAZEM HCL-DEXTROSE 125-5 MG/125ML-% IV SOLN (PREMIX)
5.0000 mg/h | INTRAVENOUS | Status: DC
  Administered 2022-11-12: 5 mg/h via INTRAVENOUS
  Administered 2022-11-13: 10 mg/h via INTRAVENOUS
  Filled 2022-11-12 (×2): qty 125

## 2022-11-12 MED ORDER — ZOLPIDEM TARTRATE 5 MG PO TABS
10.0000 mg | ORAL_TABLET | Freq: Every day | ORAL | Status: DC
  Administered 2022-11-12 – 2022-11-13 (×2): 10 mg via ORAL
  Filled 2022-11-12 (×2): qty 2

## 2022-11-12 MED ORDER — LORAZEPAM 2 MG/ML PO CONC
0.5000 mg | Freq: Once | ORAL | Status: DC
Start: 2022-11-12 — End: 2022-11-12

## 2022-11-12 MED ORDER — ACETAMINOPHEN 650 MG RE SUPP: 650.0000 mg | Freq: Four times a day (QID) | RECTAL | Status: DC | PRN

## 2022-11-12 MED ORDER — ACETAMINOPHEN 325 MG PO TABS: 650.0000 mg | ORAL_TABLET | Freq: Four times a day (QID) | ORAL | Status: DC | PRN

## 2022-11-12 MED ORDER — HEPARIN (PORCINE) 25000 UT/250ML-% IV SOLN
1400.0000 [IU]/h | INTRAVENOUS | Status: DC
  Administered 2022-11-12: 1150 [IU]/h via INTRAVENOUS
  Administered 2022-11-15: 1400 [IU]/h via INTRAVENOUS
  Filled 2022-11-12 (×3): qty 250

## 2022-11-12 MED ORDER — LORAZEPAM 2 MG/ML IJ SOLN
2.0000 mg | Freq: Once | INTRAMUSCULAR | Status: DC
  Filled 2022-11-12: qty 1

## 2022-11-12 MED ORDER — POLYETHYLENE GLYCOL 3350 17 G PO PACK: 17.0000 g | PACK | Freq: Every day | ORAL | Status: DC | PRN

## 2022-11-12 MED ORDER — METOPROLOL TARTRATE 5 MG/5ML IV SOLN
5.0000 mg | INTRAVENOUS | Status: AC | PRN
  Administered 2022-11-12 – 2022-11-13 (×2): 5 mg via INTRAVENOUS
  Filled 2022-11-12 (×2): qty 5

## 2022-11-12 MED ORDER — DILTIAZEM LOAD VIA INFUSION
10.0000 mg | Freq: Once | INTRAVENOUS | Status: AC
  Administered 2022-11-12: 10 mg via INTRAVENOUS
  Filled 2022-11-12: qty 10

## 2022-11-12 MED ORDER — HALOPERIDOL LACTATE 5 MG/ML IJ SOLN
2.0000 mg | Freq: Once | INTRAMUSCULAR | Status: AC
  Administered 2022-11-12: 2 mg via INTRAVENOUS
  Filled 2022-11-12: qty 1

## 2022-11-12 NOTE — ED Notes (Signed)
Pt in a fib rvr at this time. EDP aware. Validated with radial pulse as well.

## 2022-11-12 NOTE — ED Notes (Signed)
Pt has removed IV's and is beyond baseline confused.

## 2022-11-12 NOTE — Assessment & Plan Note (Addendum)
Tachycardic to 143, now improved status post Cardizem bolus and now on drip.  Rates in the low 100s.  No history of atrial fibrillation. CHADSVasc - score of 7 (Age, stroke hx, HTN, CHF, Vasc hx).  A-fib with RVR in the setting of CHF, and NSTEMI. -10 mg bolus Cardizem given, continue drip -Echocardiogram -Continue heparin drip - EDP to Dr. Allyson Sabal, team to see here, okay for patient to stay here. - No hx of GI bleeds, No falls but sometimes has problems with balance - Consider PT eval prior to discharge - PRN IV 5mg  metoprolol for sustained heart rate > 120 on cardizem.

## 2022-11-12 NOTE — Assessment & Plan Note (Addendum)
Presenting with chest pains.  Troponin elevated at 1125.  EKG showing atrial fibrillation, history of triple bypass surgery 1999 in [place].  Ongoing tobacco abuse.   - Likely demand ischemia, but with underlying CAD history, I talked to patient and spouse at bedside about admission to [facility], they declined, they would rather patient be admitted here, trend troponins, and have cardiology see in a.m. and decide if transfer is still warranted, as patient does not need a procedure now. -Continue heparin drip - Cardiology okay with patient staying here -Cardiology consult in a.m. - ECHO -Resume atorvastatin - On Cardizem drip so carvedilol held

## 2022-11-12 NOTE — ED Provider Notes (Signed)
[place] EMERGENCY DEPARTMENT AT [facility] Provider Note   CSN: [id number] Arrival date & time: 11/12/22  1436     History  Chief Complaint  Patient presents with   Chest Pain   Emesis    Demaryius Imran Kalt is a 82 y.o. male.  HPI Presents with family members who provide the history. Patient has dementia, but can seemingly answer questions about his current condition appropriately. Family notes that over the past weeks he has had episodes of left-sided chest pain, nausea, vomiting.  He continues to have dialysis including today.  He was seen, evaluated here, 5 days ago, left prior to completing care.  Family is unaware of a diagnosis of atrial fibrillation, cardiology notes reviewed, also no mention of A-fib.  Level 5 [place] secondary to dementia.    Home Medications Prior to Admission medications   Medication Sig Start Date End Date Taking? Authorizing Provider  atorvastatin (LIPITOR) 80 MG tablet Take 80 mg by mouth in the morning. 07/05/19  Yes [provider]  calcium acetate (PHOSLO) 667 MG capsule Take 1,334 mg by mouth 3 (three) times daily. citracal   Yes [provider]  carvedilol (COREG) 12.5 MG tablet Take 6.25 mg by mouth 2 (two) times daily with a meal.   Yes [provider]  Magnesium 250 MG TABS Take 1 tablet by mouth at bedtime.   Yes [provider]  tamsulosin (FLOMAX) 0.4 MG CAPS capsule Take 1 capsule (0.4 mg total) by mouth daily after supper. 11/04/22  Yes McKenzie, Mardene Celeste, MD  traMADol (ULTRAM) 50 MG tablet Take 50 mg by mouth 3 (three) times daily as needed (pain.).   Yes [provider]  zolpidem (AMBIEN) 10 MG tablet Take 10 mg by mouth at bedtime.   Yes [provider]  memantine (NAMENDA) 5 MG tablet Take 1 tablet (5 mg at night) for 2 weeks, then increase to 1 tablet (5 mg) twice a day Patient not taking: Reported on 11/12/2022 08/08/21   Marcos Eke, PA-C      Allergies     Patient has no known allergies.    Review of Systems   Review of Systems  Unable to perform ROS: Dementia    Physical Exam Updated Vital Signs BP (!) 140/77   Pulse (!) 119   Resp 18   Ht 6\' 2"  (1.88 m)   Wt 94.3 kg   SpO2 97%   BMI 26.69 kg/m  Physical Exam Vitals and nursing note reviewed.  Constitutional:      General: He is not in acute distress.    Appearance: He is well-developed.  HENT:     Head: Normocephalic and atraumatic.  Eyes:     Conjunctiva/sclera: Conjunctivae normal.  Cardiovascular:     Rate and Rhythm: Tachycardia present. Rhythm irregular.  Pulmonary:     Effort: Pulmonary effort is normal. No respiratory distress.     Breath sounds: No stridor.  Abdominal:     General: There is no distension.     Tenderness: There is no abdominal tenderness.  Musculoskeletal:     Right lower leg: Edema present.     Left lower leg: Edema present.  Skin:    General: Skin is warm and dry.  Neurological:     Mental Status: He is alert.  Psychiatric:        Behavior: Behavior normal.     ED Results / Procedures / Treatments   Labs (all labs ordered are listed, but  only abnormal results are displayed) Labs Reviewed  BASIC METABOLIC PANEL - Abnormal; Notable for the following components:      Result Value   Chloride 97 (*)    Glucose, Bld 128 (*)    Creatinine, Ser 4.30 (*)    Calcium 8.3 (*)    GFR, Estimated 13 (*)    All other components within normal limits  CBC - Abnormal; Notable for the following components:   RBC 3.56 (*)    Hemoglobin 10.8 (*)    HCT 34.6 (*)    All other components within normal limits  BRAIN NATRIURETIC PEPTIDE - Abnormal; Notable for the following components:   B Natriuretic Peptide 1,510.0 (*)    All other components within normal limits  TROPONIN I (HIGH SENSITIVITY) - Abnormal; Notable for the following components:   Troponin I (High Sensitivity) 1,125 (*)    All other components within normal limits  MAGNESIUM   HEPARIN LEVEL (UNFRACTIONATED)  CBC  TROPONIN I (HIGH SENSITIVITY)    EKG EKG Interpretation Date/Time:  Wednesday November 12 2022 14:59:59 EDT Ventricular Rate:  133 PR Interval:    QRS Duration:  160 QT Interval:  397 QTC Calculation: 591 R Axis:   266  Text Interpretation: Atrial fibrillation RBBB and LAFB LVH by voltage Anterior Q waves, possibly due to LVH Confirmed by Gerhard Munch [phone]) on 11/12/2022 3:10:21 PM  Radiology DG Chest Port 1 View  Result Date: 11/12/2022 CLINICAL DATA:  Chest pain.  Loss of balance after dialysis. EXAM: PORTABLE CHEST 1 VIEW COMPARISON:  One-view chest x-ray 11/07/2022 FINDINGS: Heart is enlarged. Mild interstitial edema is present bilaterally. No focal airspace consolidation is present. Patient is status post median sternotomy. IMPRESSION: Cardiomegaly and mild interstitial edema compatible with congestive heart failure. Electronically Signed   By: Marin Roberts M.D.   On: 11/12/2022 15:58    Procedures Procedures    Medications Ordered in ED Medications  diltiazem (CARDIZEM) 1 mg/mL load via infusion 10 mg (10 mg Intravenous Bolus from Bag 11/12/22 1551)    And  diltiazem (CARDIZEM) 125 mg in dextrose 5% 125 mL (1 mg/mL) infusion (10 mg/hr Intravenous Rate/Dose Change 11/12/22 1658)  heparin bolus via infusion 4,000 Units (4,000 Units Intravenous Bolus from Bag 11/12/22 1652)    Followed by  heparin ADULT infusion 100 units/mL (25000 units/237mL) (1,150 Units/hr Intravenous New Bag/Given 11/12/22 1652)    ED Course/ Medical Decision Making/ A&P                                 Medical Decision Making Elderly male with multiple medical issues including end-stage renal disease, dementia, hypertension, heart failure presents with chest pain, nausea, vomiting.  Patient in A-fib with rapid ventricular response, seemingly first identification this on brief chart review, and review of cardiology notes.  Suspicion for electrolyte  abnormalities versus paroxysmal A-fib contributing to his symptoms. Patient placed on continuous monitoring, labs sent diltiazem started. Cardiac 140s A-fib abnormal Pulse ox 99% room air normal   Amount and/or Complexity of Data Reviewed Independent Historian: spouse External Data Reviewed: notes.    Details: Cardiology note from 6 months ago with history included below. Labs: ordered. Decision-making details documented in ED Course. Radiology: ordered and independent interpretation performed. Decision-making details documented in ED Course. ECG/medicine tests: ordered and independent interpretation performed. Decision-making details documented in ED Course.  Risk Prescription drug management. Decision regarding hospitalization.   Cardiology Clinic Note this  spring  Izekiel Flegel Sonn is a 82 y.o. male with history of hypertension, hyperlipidemia, CKD, CAD status post CABG 1999 cath 2009 LIMA patent, SVG to the circumflex patent and native circumflex to RCA collaterals with total occlusion of native coronary arteries.  All done in [place]. Had a mini stroke during a procedure in [place] as well.  Had AAA repair 01/29/2012 op note reported small type II endoleak at end of procedure.   Patient was seen for the first time by me 07/2016 and exercise Myoview ordered because of likely need for dialysis and surgery for fistula.  Patient had hypertensive response to exercise prior scar no ischemia LVEF 56% low risk study.  2D echo 05/11/2019 LVEF 55 to 60% grade 2 DD   Seen by PA 08/06/20 BP up had central catheter for dialysis access Subsequently had left radio-cephalic AV fistula with Dr Arbie Cookey 10/16/20 Previously had right tunneled IJ catheter on 08/02/20 K 4.2 Cr 6.5    TTE 09/13/20 EF 50-55% mild AR Myovue 09/12/20 fixed defect in mid/apical anteroseptal wall and apical to basal inferior lateral wall no ischemia EF poorly estimated 31% frequent PAC short bursts of SVT Was not taking coreg at time    From  [place] Lived in [place] Has two older children son in [place] and daughter in [place] Wife Is younger and healthy    Dialysis going well Sees Early and has left radiocephalic fistula now had some memory issues and noted 50-69% left ICA stenosis 07/23/21 Has not mentioned f/u for his abdominal stent graft  5:10 PM Patient on heparin, Cardizem, no chest pain.  Heart rate now 110, down from 150 on arrival. I discussed this case with her cardiology colleague, Dr. Allyson Sabal.  Patient's wife aware of all findings, including concern for new A-fib with elevated troponin likely secondary to elevated rate for somewhat of time which may have been intermittent for the past days given his reported illness for about 1 week. With concern for new A-fib, NSTEMI, patient will require admission for ongoing monitoring, management, cardiology is following as a consulting service given the patient's other medical problems including dementia, end-stage renal disease.  He has no new oxygen requirement, has no increased work of breathing, denies any dyspnea, and did have dialysis already today.   Final Clinical Impression(s) / ED Diagnoses Final diagnoses:  NSTEMI (non-ST elevated myocardial infarction) (HCC)  Atrial fibrillation with rapid ventricular response (HCC)  CRITICAL CARE Performed by: Gerhard Munch Total critical care time: 35 minutes Critical care time was exclusive of separately billable procedures and treating other patients. Critical care was necessary to treat or prevent imminent or life-threatening deterioration. Critical care was time spent personally by me on the following activities: development of treatment plan with patient and/or surrogate as well as nursing, discussions with consultants, evaluation of patient's response to treatment, examination of patient, obtaining history from patient or surrogate, ordering and performing treatments and interventions, ordering and review of laboratory studies, ordering and  review of radiographic studies, pulse oximetry and re-evaluation of patient's condition.    Gerhard Munch, MD 11/12/22 [phone]

## 2022-11-12 NOTE — Assessment & Plan Note (Addendum)
Type as yet unspecified.  Chest x-ray with mild interstitial edema, presenting with orthopnea.  BNP elevated at 1510.  Not hypoxic. Dialysis Monday Wednesday Friday. -Per HD - ECHO

## 2022-11-12 NOTE — Assessment & Plan Note (Signed)
Dialysis Monday, Wednesday and Friday.  Has been on HD for about 2 years now. -Completed HD session today -Please consult nephrology in a.m. -Resume renal meds

## 2022-11-12 NOTE — ED Notes (Signed)
Date and time results received: 11/12/22 1600  (use smartphrase ".now" to insert current time)  Test: troponin Critical Value: 1125  Name of Provider Notified: [facility]OCKWOOD  Orders Received? Or Actions Taken?: Orders Received - See Orders for details

## 2022-11-12 NOTE — Progress Notes (Signed)
PHARMACY - ANTICOAGULATION CONSULT NOTE  Pharmacy Consult for Heparin Indication: chest pain/ACS  No Known Allergies  Patient Measurements: Height: 6\' 2"  (188 cm) Weight: 94.3 kg (207 lb 14.3 oz) IBW/kg (Calculated) : 82.2 HEPARIN DW (KG): 94.3   Vital Signs: Temp Source: Oral (10/02 1501) BP: 118/92 (10/02 1600) Pulse Rate: 108 (10/02 1600)  Labs: Recent Labs    11/12/22 1508  HGB 10.8*  HCT 34.6*  PLT 175  CREATININE 4.30*  TROPONINIHS 1,125*    Estimated Creatinine Clearance: 15.7 mL/min (A) (by C-G formula based on SCr of 4.3 mg/dL (H)).   Medical History: Past Medical History:  Diagnosis Date   GERD (gastroesophageal reflux disease)    Glaucoma    Hypertension    Macular degeneration    Myocardial infarction [facility])    1999   Prostate cancer (HCC) 02/2018   Renal insufficiency    Stroke (HCC)    no deficits; had stroke while trying to place "stents in legs".    Medications:  See med rec  Assessment: 82 yo male presented to ED with c/o chest pain post HD today. Patient is in atrial fib  RVR. Elevated troponin. Not on any oral anticoagulants. Pharmacy asked to start heparin  Goal of Therapy:  Heparin level 0.3-0.7 units/ml Monitor platelets by anticoagulation protocol: Yes   Plan:  Give 4000 units bolus x 1 Start heparin infusion at 1150 units/hr Check anti-Xa level in ~8  hours and daily while on heparin Continue to monitor H&H and platelets  Elder Cyphers, BS Pharm D, BCPS Clinical Pharmacist 11/12/2022,4:26 PM

## 2022-11-12 NOTE — ED Notes (Signed)
Dr.Lockwood at bedside  

## 2022-11-12 NOTE — Assessment & Plan Note (Signed)
Ongoing tobacco abuse, smokes at least a pack of cigarettes daily. - Declined nicotine patch due to allergic skin reaction

## 2022-11-12 NOTE — H&P (Addendum)
History and Physical    Ronald [name] ZOX:[id number] DOB: [birth year] DOA: 11/12/2022  PCP: [name], MD   Patient coming from: Home  I have personally briefly reviewed patient's old medical records in [facility]  Chief Complaint: Chest pain, Confusion  HPI: Ronald [name] is a 82 y.o. male with medical history significant for CABG, ESRD, hypertension, stroke. Patient was brought to the ED with reports of confusion, chest pain and vomiting.  At the time of my evaluation, patient is awake alert oriented x 4 and able to answer questions, spouse and a friend is at bedside.  Over the past 4 to 5 days, patient has had intermittent mid and left- sided and chest pains, and left upper abdominal pain.  He has had difficulty breathing worse when lying down.  He has had intermittent episodes of vomiting past few days.  No fevers no chills.  No palpitations.  He denies diagnosis of atrial fibrillation in the past. He went for dialysis today, spouse reports he was not himself, his speech was confused, he vomited 4 times, and he had trouble keeping his balance.  At baseline he has some mild cognitive impairment, he sometimes has problems with balance, but he has not had any falls.  No history of GI bleed.  ED Course: Temperature 97.5.  Tachycardic to 143, respirate rate 13-20.  Blood pressure systolic 96-156.  O2 sats greater than 93% on room air. EKG showing atrial fibrillation Troponin elevated at 1125. BNP elevated at 1510. Chest x-ray with cardiomegaly, mild interstitial edema suggesting CHF. EDP talked to Dr. Allyson Sabal, okay to admit patient here, and cardiology team to see.  Review of Systems: As per HPI all other systems reviewed and negative.  Past Medical History:  Diagnosis Date   GERD (gastroesophageal reflux disease)    Glaucoma    Hypertension    Macular degeneration    Myocardial infarction [facility])    1999   Prostate cancer (HCC) 02/2018   Renal insufficiency     Stroke [facility])    no deficits; had stroke while trying to place "stents in legs".    Past Surgical History:  Procedure Laterality Date   ABDOMINAL AORTIC ANEURYSM REPAIR  2012   IN [place]   APPENDECTOMY     AV FISTULA PLACEMENT Left 10/16/2020   Procedure: LEFT ARM ARTERIOVENOUS (AV) FISTULA CREATION;  Surgeon: Larina Earthly, MD;  Location: [facility] ORS;  Service: Vascular;  Laterality: Left;   CORONARY ARTERY BYPASS GRAFT  01/1998   3 vessels 18 years ago   INGUINAL HERNIA REPAIR Right 09/20/2015   Procedure: REPAIR RIGHT INGUINAL HERNIA  WITH MESH;  Surgeon: Ancil Linsey, MD;  Location: [facility] ORS;  Service: General;  Laterality: Right;   INSERTION OF DIALYSIS CATHETER Right 08/02/2020   Procedure: INSERTION OF RIGHT TUNNELED INTERNAL JUGULAR  DIALYSIS CATHETER;  Surgeon: Larina Earthly, MD;  Location: [facility] ORS;  Service: Vascular;  Laterality: Right;   REMOVAL OF A DIALYSIS CATHETER N/A 03/19/2021   Procedure: MINOR REMOVAL OF A TUNNELED DIALYSIS CATHETER;  Surgeon: Larina Earthly, MD;  Location: [facility] ORS;  Service: Vascular;  Laterality: N/A;   tumor removal from bladder       reports that he has quit smoking. His smoking use included cigarettes. He started smoking about 65 years ago. He has a 32.6 pack-year smoking history. He has never used smokeless tobacco. He reports that he does not drink alcohol and does not use drugs.  No  Known Allergies  Family History  Problem Relation Age of Onset   Cancer Father     Prior to Admission medications   Medication Sig Start Date End Date Taking? Authorizing Provider  atorvastatin (LIPITOR) 80 MG tablet Take 80 mg by mouth in the morning. 07/05/19  Yes [provider]  calcium acetate (PHOSLO) 667 MG capsule Take 1,334 mg by mouth 3 (three) times daily. citracal   Yes [provider]  carvedilol (COREG) 12.5 MG tablet Take 6.25 mg by mouth 2 (two) times daily with a meal.   Yes [provider]  Magnesium 250 MG TABS Take 1  tablet by mouth at bedtime.   Yes [provider]  tamsulosin (FLOMAX) 0.4 MG CAPS capsule Take 1 capsule (0.4 mg total) by mouth daily after supper. 11/04/22  Yes McKenzie, Mardene Celeste, MD  traMADol (ULTRAM) 50 MG tablet Take 50 mg by mouth 3 (three) times daily as needed (pain.).   Yes [provider]  zolpidem (AMBIEN) 10 MG tablet Take 10 mg by mouth at bedtime.   Yes [provider]  memantine (NAMENDA) 5 MG tablet Take 1 tablet (5 mg at night) for 2 weeks, then increase to 1 tablet (5 mg) twice a day Patient not taking: Reported on 11/12/2022 08/08/21   Marcos Eke, PA-C    Physical Exam: Vitals:   11/12/22 1600 11/12/22 1615 11/12/22 1645 11/12/22 1700  BP: (!) 118/92 (!) 118/92 (!) 156/73 (!) 140/77  Pulse: (!) 108 (!) 121 (!) 115 (!) 119  Resp: 17 18 15 18   TempSrc:      SpO2: 96% 93% 94% 97%  Weight: 94.3 kg     Height: 6\' 2"  (1.88 m)       Constitutional: NAD, calm, comfortable Vitals:   11/12/22 1600 11/12/22 1615 11/12/22 1645 11/12/22 1700  BP: (!) 118/92 (!) 118/92 (!) 156/73 (!) 140/77  Pulse: (!) 108 (!) 121 (!) 115 (!) 119  Resp: 17 18 15 18   TempSrc:      SpO2: 96% 93% 94% 97%  Weight: 94.3 kg     Height: 6\' 2"  (1.88 m)      Eyes: PERRL, lids and conjunctivae normal ENMT: Mucous membranes are moist.  Neck: normal, supple, no masses, no thyromegaly Respiratory: Bilateral lower lung crackles.  No wheezing.  Normal respiratory effort. No accessory muscle use.  Cardiovascular: Tachycardic, Irregular rate and rhythm, no murmurs / rubs / gallops. No extremity edema.  Extremities warm. Abdomen: no tenderness, no masses palpated. No hepatosplenomegaly.   Musculoskeletal: no clubbing / cyanosis. No joint deformity upper and lower extremities.  Skin: no rashes, lesions, ulcers. No induration Neurologic: No facial asymmetry, speech fluent, 4+/5 strength in all extremities.  Psychiatric: Normal judgment and insight. Alert and oriented x 4.  Normal mood.  Able to answer questions appropriately.  Labs on Admission: I have personally reviewed following labs and imaging studies  CBC: Recent Labs  Lab 11/07/22 1418 11/12/22 1508  WBC 6.4 6.7  HGB 12.8* 10.8*  HCT 39.2 34.6*  MCV 96.6 97.2  PLT 190 175   Basic Metabolic Panel: Recent Labs  Lab 11/07/22 1418 11/12/22 1508  NA 135 135  K 4.1 3.7  CL 96* 97*  CO2 22 29  GLUCOSE 107* 128*  BUN 45* 19  CREATININE 7.73* 4.30*  CALCIUM 9.2 8.3*  MG  --  2.0   Urine analysis:    Component Value Date/Time   APPEARANCEUR Clear 10/29/2022 0906   GLUCOSEU Trace (  A) 10/29/2022 0906   BILIRUBINUR Negative 10/29/2022 0906   PROTEINUR 3+ (A) 10/29/2022 0906   UROBILINOGEN 0.2 05/17/2019 0917   NITRITE Negative 10/29/2022 0906   LEUKOCYTESUR Negative 10/29/2022 0906    Radiological Exams on Admission: DG Chest Port 1 View  Result Date: 11/12/2022 CLINICAL DATA:  Chest pain.  Loss of balance after dialysis. EXAM: PORTABLE CHEST 1 VIEW COMPARISON:  One-view chest x-ray 11/07/2022 FINDINGS: Heart is enlarged. Mild interstitial edema is present bilaterally. No focal airspace consolidation is present. Patient is status post median sternotomy. IMPRESSION: Cardiomegaly and mild interstitial edema compatible with congestive heart failure. Electronically Signed   By: Marin Roberts M.D.   On: 11/12/2022 15:58    EKG: Independently reviewed.  Atrial fibrillation, rate 133, QTc 591, RBBB, LAFB.  Assessment/Plan Principal Problem:   Atrial fibrillation with RVR (HCC) Active Problems:   NSTEMI (non-ST elevated myocardial infarction) (HCC)   Acute congestive heart failure (HCC)   Tobacco abuse   Hx of CABG   ESRD (end stage renal disease) (HCC)   Assessment and Plan: * Atrial fibrillation with RVR (HCC) Tachycardic to 143, now improved status post Cardizem bolus and now on drip.  Rates in the low 100s.  No history of atrial fibrillation. CHADSVasc - score of 7 (Age,  stroke hx, HTN, CHF, Vasc hx).  A-fib with RVR in the setting of CHF, and NSTEMI. -10 mg bolus Cardizem given, continue drip -Echocardiogram -Continue heparin drip - EDP to Dr. Allyson Sabal, team to see here, okay for patient to stay here. - No hx of GI bleeds, No falls but sometimes has problems with balance - Consider PT eval prior to discharge - PRN IV 5mg  metoprolol for sustained heart rate > 120 on cardizem.  Acute congestive heart failure (HCC) Type as yet unspecified.  Chest x-ray with mild interstitial edema, presenting with orthopnea.  BNP elevated at 1510.  Not hypoxic. Dialysis Monday Wednesday Friday. -Per HD - ECHO  NSTEMI (non-ST elevated myocardial infarction) [facility]) Presenting with chest pains.  Troponin elevated at 1125.  EKG showing atrial fibrillation, history of triple bypass surgery 1999 in [place].  Ongoing tobacco abuse.   - Likely demand ischemia, but with underlying CAD history, I talked to patient and spouse at bedside about admission to [facility], they declined, they would rather patient be admitted here, trend troponins, and have cardiology see in a.m. and decide if transfer is still warranted, as patient does not need a procedure now. -Continue heparin drip - Cardiology okay with patient staying here -Cardiology consult in a.m. - ECHO -Resume atorvastatin - On Cardizem drip so carvedilol held   Tobacco abuse Ongoing tobacco abuse, smokes at least a pack of cigarettes daily. - Declined nicotine patch due to allergic skin reaction  ESRD (end stage renal disease) (HCC) Dialysis Monday, Wednesday and Friday.  Has been on HD for about 2 years now. -Completed HD session today -Please consult nephrology in a.m. -Resume renal meds   DVT prophylaxis: heparin gtt Code Status: FULL code-confirmed with patient, spouse and family friend at bedside.  Family Communication: Spouse and family friend at bedside. Patient and spouse are from [place].  Disposition Plan: > 2  days Consults called: CArdiology Admission status: INpt Stepdown I certify that at the point of admission it is my clinical judgment that the patient will require inpatient hospital care spanning beyond 2 midnights from the point of admission due to high intensity of service, high risk for further deterioration and high frequency of  surveillance required.   Author: Onnie Boer, MD 11/12/2022 6:46 PM  For on call review [email or web address].

## 2022-11-12 NOTE — ED Notes (Signed)
Wife at bedside who states when pt got home from dialysis he was totally different "talking nonsense and couldn't remember how he got home or anything" pt baseline slightly confused per wife but not like this. Wife states he started c/o chest pain so she called dialysis to ask them what happened to him while he was there so they called ems. Pt takes dialysis only on Monday and Fridays per wife.

## 2022-11-12 NOTE — ED Notes (Signed)
Patient transported to CT 

## 2022-11-12 NOTE — ED Triage Notes (Signed)
Pt was at dialysis today and finished tx. Dialysis center , [facility], called EMS after discharging pt to home stating pt lost balance and thought he might have had a stroke and to get pt from home-info per rcems. Pt denies any dizziness, denies weakness on one side, smile symmetrical. Moving all extremities. Pt is alert oriented to person, time, place but not situation.  When ems arrived to home pt c/o chest pain. Upon triage pt stated he had some pain and pointed to epigastric area. Pt states pain is going down left arm. Can not describe pain. Color wnl. Non diaphoretic. Pt states he vomited x 4 times today.

## 2022-11-13 ENCOUNTER — Encounter (HOSPITAL_COMMUNITY): Payer: 59

## 2022-11-13 ENCOUNTER — Inpatient Hospital Stay (HOSPITAL_COMMUNITY): Payer: 59

## 2022-11-13 ENCOUNTER — Other Ambulatory Visit (HOSPITAL_COMMUNITY): Payer: Self-pay | Admitting: *Deleted

## 2022-11-13 DIAGNOSIS — N186 End stage renal disease: Secondary | ICD-10-CM | POA: Diagnosis not present

## 2022-11-13 DIAGNOSIS — I509 Heart failure, unspecified: Secondary | ICD-10-CM

## 2022-11-13 DIAGNOSIS — I4891 Unspecified atrial fibrillation: Secondary | ICD-10-CM | POA: Diagnosis not present

## 2022-11-13 DIAGNOSIS — I214 Non-ST elevation (NSTEMI) myocardial infarction: Secondary | ICD-10-CM | POA: Diagnosis not present

## 2022-11-13 LAB — BASIC METABOLIC PANEL
Anion gap: 13 (ref 5–15)
BUN: 27 mg/dL — ABNORMAL HIGH (ref 8–23)
CO2: 24 mmol/L (ref 22–32)
Calcium: 8.1 mg/dL — ABNORMAL LOW (ref 8.9–10.3)
Chloride: 95 mmol/L — ABNORMAL LOW (ref 98–111)
Creatinine, Ser: 5.19 mg/dL — ABNORMAL HIGH (ref 0.61–1.24)
GFR, Estimated: 10 mL/min — ABNORMAL LOW (ref >60)
Glucose, Bld: 135 mg/dL — ABNORMAL HIGH (ref 70–99)
Potassium: 4 mmol/L (ref 3.5–5.1)
Sodium: 132 mmol/L — ABNORMAL LOW (ref 135–145)

## 2022-11-13 LAB — CBC
HCT: 35.9 % — ABNORMAL LOW (ref 39.0–52.0)
Hemoglobin: 11.5 g/dL — ABNORMAL LOW (ref 13.0–17.0)
MCH: 31.1 pg (ref 26.0–34.0)
MCHC: 32 g/dL (ref 30.0–36.0)
MCV: 97 fL (ref 80.0–100.0)
Platelets: 200 10*3/uL (ref 150–400)
RBC: 3.7 MIL/uL — ABNORMAL LOW (ref 4.22–5.81)
RDW: 15.2 % (ref 11.5–15.5)
WBC: 8.4 10*3/uL (ref 4.0–10.5)
nRBC: 0 % (ref 0.0–0.2)

## 2022-11-13 LAB — HEPARIN LEVEL (UNFRACTIONATED)
Heparin Unfractionated: 0.2 [IU]/mL — ABNORMAL LOW (ref 0.30–0.70)
Heparin Unfractionated: 0.33 [IU]/mL (ref 0.30–0.70)
Heparin Unfractionated: 0.4 [IU]/mL (ref 0.30–0.70)

## 2022-11-13 LAB — ECHOCARDIOGRAM COMPLETE
AR max vel: 1.07 cm2
AV Area VTI: 1.1 cm2
AV Area mean vel: 1.19 cm2
AV Mean grad: 18.8 mm[Hg]
AV Peak grad: 44.4 mm[Hg]
Ao pk vel: 3.33 m/s
Area-P 1/2: 4.8 cm2
Height: 74 in
MV M vel: 5.3 m/s
MV Peak grad: 112.4 mm[Hg]
S' Lateral: 4 cm
Weight: 3266.34 [oz_av]

## 2022-11-13 LAB — MRSA NEXT GEN BY PCR, NASAL: MRSA by PCR Next Gen: NOT DETECTED

## 2022-11-13 LAB — HEPATITIS B SURFACE ANTIGEN: Hepatitis B Surface Ag: NONREACTIVE

## 2022-11-13 MED ORDER — MEMANTINE HCL 10 MG PO TABS
5.0000 mg | ORAL_TABLET | Freq: Two times a day (BID) | ORAL | Status: DC
  Administered 2022-11-13 – 2022-11-17 (×9): 5 mg via ORAL
  Filled 2022-11-13 (×9): qty 1

## 2022-11-13 MED ORDER — HALOPERIDOL LACTATE 5 MG/ML IJ SOLN
2.0000 mg | Freq: Four times a day (QID) | INTRAMUSCULAR | Status: DC | PRN
  Administered 2022-11-13 – 2022-11-14 (×2): 2 mg via INTRAVENOUS
  Filled 2022-11-13 (×2): qty 1

## 2022-11-13 MED ORDER — DIAZEPAM 5 MG/ML IJ SOLN
5.0000 mg | Freq: Four times a day (QID) | INTRAMUSCULAR | Status: DC | PRN
  Administered 2022-11-13 – 2022-11-17 (×5): 5 mg via INTRAVENOUS
  Filled 2022-11-13 (×5): qty 2

## 2022-11-13 MED ORDER — CARVEDILOL 12.5 MG PO TABS
12.5000 mg | ORAL_TABLET | Freq: Two times a day (BID) | ORAL | Status: DC
  Administered 2022-11-14 – 2022-11-15 (×3): 12.5 mg via ORAL
  Filled 2022-11-13 (×3): qty 1

## 2022-11-13 MED ORDER — CHLORHEXIDINE GLUCONATE CLOTH 2 % EX PADS
6.0000 | MEDICATED_PAD | Freq: Every day | CUTANEOUS | Status: DC
  Administered 2022-11-13 – 2022-11-16 (×4): 6 via TOPICAL

## 2022-11-13 MED ORDER — DIAZEPAM 5 MG/ML IJ SOLN
5.0000 mg | Freq: Once | INTRAMUSCULAR | Status: AC
  Administered 2022-11-13: 5 mg via INTRAVENOUS
  Filled 2022-11-13: qty 2

## 2022-11-13 MED ORDER — METOPROLOL TARTRATE 5 MG/5ML IV SOLN
INTRAVENOUS | Status: AC
  Filled 2022-11-13: qty 5

## 2022-11-13 MED ORDER — ORAL CARE MOUTH RINSE: 15.0000 mL | OROMUCOSAL | Status: DC | PRN

## 2022-11-13 MED ORDER — ASPIRIN 81 MG PO TBEC
81.0000 mg | DELAYED_RELEASE_TABLET | Freq: Every day | ORAL | Status: DC
  Administered 2022-11-13 – 2022-11-17 (×5): 81 mg via ORAL
  Filled 2022-11-13 (×5): qty 1

## 2022-11-13 NOTE — Progress Notes (Signed)
Pt lives with wife and is fairly independent with ADLs. No home health prior to admission. TOC will follow.    11/13/22 0749  TOC Brief Assessment  Insurance and Status Reviewed  Patient has primary care physician Yes  Home environment has been reviewed Lives with wife.  Prior level of function: Fairly independent.  Prior/Current Home Services No current home services  Social Determinants of Health Reivew SDOH reviewed no interventions necessary  Readmission risk has been reviewed Yes  Transition of care needs no transition of care needs at this time

## 2022-11-13 NOTE — Progress Notes (Signed)
PROGRESS NOTE  Ronald [name] VHQ:[id number] DOB: [birth year] DOA: 11/12/2022 PCP: [name], MD  Brief History:  82 year old male with a history of coronary disease status post CABG, ESRD, hypertension, stroke, chronic impairment presenting with 4 to 5-day history of chest pain, orthopnea, and associated nausea and vomiting.  The patient went to dialysis on 11/12/22 and stayed the whole time.  The patient was confused at this time.  Spouse states that at baseline the patient has some cognitive impairment.  There is not been any fevers, chills, headache, neck pain abdominal pain.  There is no hematochezia or melena. In the ED, the patient was afebrile and hemodynamically stable.  Oxygen saturation 100% room air.  The patient was noted to have atrial fibrillation with RVR with heart rate in the 140s.  He was started on diltiazem drip. WBC 6.7, hemoglobin 10.8, platelets 200.  Sodium 132, potassium 4.0, bicarbonate 24, serum creatinine 5.19.  Cardiology and nephrology were consulted to assist with management.   Assessment/Plan: NSTEMI -Troponins  1115>>[id number] -Continue IV heparin -Cardiology consult -Echocardiogram -spouse prefers non-invasive management  Atrial fibrillation with RVR -Initially started on IV diltiazem -Patient has converted back to sinus -TSH -Echo  ESRD -Patient dialyzes Monday, Wednesday, Friday -Last dialysis 11/12/22 -Nephrology consulted for maintenance dialysis -Continue phosphate binders  Essential hypertension -Patient on carvedilol at home--6.25 mg twice daily -Defer changes to cardiology at this time as there may be some changes due to the patient's atrial fibrillation  Major neurocognitive disorder -Spouse endorses gradual chronic decline over the last 6 months -Continue Namenda  Mixed hyperlipidemia -Continue statin  Tobacco abuse -Tobacco cessation discussed       Family Communication:   spouse updated  Consultants:   cardiology, nephrology  Code Status:  DNR--confirmed with spouse on 10/3  DVT Prophylaxis:  IV Heparin    Procedures: As Listed in Progress Note Above  Antibiotics: None       Subjective: Patient denies fevers, chills, headache, chest pain, dyspnea, nausea, vomiting, diarrhea, abdominal pain  Objective: Vitals:   11/13/22 0400 11/13/22 0453 11/13/22 0500 11/13/22 0642  BP: (!) 135/97   (!) 152/80  Pulse: 83   85  Resp: (!) 22   (!) 23  Temp:  98.2 F (36.8 C)    TempSrc:  Oral    SpO2: 99%   100%  Weight:   92.6 kg   Height:        Intake/Output Summary (Last 24 hours) at 11/13/2022 0747 Last data filed at 11/13/2022 0429 Gross per 24 hour  Intake 215.46 ml  Output --  Net 215.46 ml   Weight change:  Exam:  General:  Pt is alert, follows commands appropriately, not in acute distress HEENT: No icterus, No thrush, No neck mass, [place]/AT Cardiovascular: RRR, S1/S2, no rubs, no gallops Respiratory: fine bibasilar crackles.  No wheeze Abdomen: Soft/+BS, non tender, non distended, no guarding Extremities: No edema, No lymphangitis, No petechiae, No rashes, no synovitis   Data Reviewed: I have personally reviewed following labs and imaging studies Basic Metabolic Panel: Recent Labs  Lab 11/07/22 1418 11/12/22 1508 11/13/22 0313  NA 135 135 132*  K 4.1 3.7 4.0  CL 96* 97* 95*  CO2 22 29 24   GLUCOSE 107* 128* 135*  BUN 45* 19 27*  CREATININE 7.73* 4.30* 5.19*  CALCIUM 9.2 8.3* 8.1*  MG  --  2.0  --    Liver Function Tests: No results for  input(s): "AST", "ALT", "ALKPHOS", "BILITOT", "PROT", "ALBUMIN" in the last 168 hours. No results for input(s): "LIPASE", "AMYLASE" in the last 168 hours. No results for input(s): "AMMONIA" in the last 168 hours. Coagulation Profile: No results for input(s): "INR", "PROTIME" in the last 168 hours. CBC: Recent Labs  Lab 11/07/22 1418 11/12/22 1508 11/13/22 0313  WBC 6.4 6.7 8.4  HGB 12.8* 10.8* 11.5*  HCT 39.2  34.6* 35.9*  MCV 96.6 97.2 97.0  PLT 190 175 200   Cardiac Enzymes: No results for input(s): "CKTOTAL", "CKMB", "CKMBINDEX", "TROPONINI" in the last 168 hours. BNP: Invalid input(s): "POCBNP" CBG: No results for input(s): "GLUCAP" in the last 168 hours. HbA1C: No results for input(s): "HGBA1C" in the last 72 hours. Urine analysis:    Component Value Date/Time   APPEARANCEUR Clear 10/29/2022 0906   GLUCOSEU Trace (A) 10/29/2022 0906   BILIRUBINUR Negative 10/29/2022 0906   PROTEINUR 3+ (A) 10/29/2022 0906   UROBILINOGEN 0.2 05/17/2019 0917   NITRITE Negative 10/29/2022 0906   LEUKOCYTESUR Negative 10/29/2022 0906   Sepsis Labs: @LABRCNTIP (procalcitonin:4,lacticidven:4) ) Recent Results (from the past 240 hour(s))  MRSA Next Gen by PCR, Nasal     Status: None   Collection Time: 11/12/22  8:33 PM   Specimen: Nasal Mucosa; Nasal Swab  Result Value Ref Range Status   MRSA by PCR Next Gen NOT DETECTED NOT DETECTED Final    Comment: (NOTE) The GeneXpert MRSA Assay (FDA approved for NASAL specimens only), is one component of a comprehensive MRSA colonization surveillance program. It is not intended to diagnose MRSA infection nor to guide or monitor treatment for MRSA infections. Test performance is not FDA approved in patients less than 56 years old. Performed at [facility], [address], [place], [place] [address]      Scheduled Meds:  atorvastatin  80 mg Oral q AM   calcium acetate  1,334 mg Oral TID   Chlorhexidine Gluconate Cloth  6 each Topical Daily   LORazepam  2 mg Intravenous Once   tamsulosin  0.4 mg Oral QPC supper   zolpidem  10 mg Oral QHS   Continuous Infusions:  diltiazem (CARDIZEM) infusion 10 mg/hr (11/13/22 0744)   heparin 1,300 Units/hr (11/13/22 0745)    Procedures/Studies: CT HEAD WO CONTRAST ( )  Result Date: 11/12/2022 CLINICAL DATA:  Altered mental status EXAM: CT HEAD WITHOUT CONTRAST TECHNIQUE: Contiguous axial images were obtained from  the base of the skull through the vertex without intravenous contrast. RADIATION DOSE REDUCTION: This exam was performed according to the departmental dose-optimization program which includes automated exposure control, adjustment of the mA and/or kV according to patient size and/or use of iterative reconstruction technique. COMPARISON:  MRI from 07/23/21 FINDINGS: Brain: No evidence of acute infarction, hemorrhage, hydrocephalus, extra-axial collection or mass lesion/mass effect. Chronic atrophic and ischemic changes are noted. Vascular: No hyperdense vessel or unexpected calcification. Skull: Normal. Negative for fracture or focal lesion. Sinuses/Orbits: No acute finding. Other: None. IMPRESSION: Chronic atrophic and ischemic changes stable from previous exam. No acute abnormality noted. Electronically Signed   By: Alcide Clever M.D.   On: 11/12/2022 20:42   DG Chest Port 1 View  Result Date: 11/12/2022 CLINICAL DATA:  Chest pain.  Loss of balance after dialysis. EXAM: PORTABLE CHEST 1 VIEW COMPARISON:  One-view chest x-ray 11/07/2022 FINDINGS: Heart is enlarged. Mild interstitial edema is present bilaterally. No focal airspace consolidation is present. Patient is status post median sternotomy. IMPRESSION: Cardiomegaly and mild interstitial edema compatible with congestive heart failure.  Electronically Signed   By: Marin Roberts M.D.   On: 11/12/2022 15:58   DG Chest Portable 1 View  Result Date: 11/07/2022 CLINICAL DATA:  Chest pain, shortness of breath. EXAM: PORTABLE CHEST 1 VIEW COMPARISON:  August 02, 2020. FINDINGS: Stable cardiomegaly. Hypoinflation of the lungs is noted with minimal bibasilar subsegmental atelectasis. Sternotomy wires are noted. Bony thorax is unremarkable. IMPRESSION: Hypoinflation of the lungs with minimal bibasilar subsegmental atelectasis. Electronically Signed   By: Lupita Raider M.D.   On: 11/07/2022 16:13    Catarina Hartshorn, DO  [facility]  If 7PM-7AM, please  contact night-coverage [email or web address] Password [email or web address] 11/13/2022, 7:47 AM   LOS: 1 day

## 2022-11-13 NOTE — Progress Notes (Signed)
PHARMACY - ANTICOAGULATION CONSULT NOTE  Pharmacy Consult for Heparin Indication: chest pain/ACS  No Known Allergies  Patient Measurements: Height: 6\' 2"  (188 cm) Weight: 92.6 kg (204 lb 2.3 oz) IBW/kg (Calculated) : 82.2 HEPARIN DW (KG): 92.6   Vital Signs: Temp: 97.8 F (36.6 C) (10/03 0823) Temp Source: Oral (10/03 0823) BP: 152/80 (10/03 0642) Pulse Rate: 85 (10/03 0642)  Labs: Recent Labs    11/12/22 1508 11/12/22 1713 11/12/22 2143 11/13/22 0015 11/13/22 0313 11/13/22 0945  HGB 10.8*  --   --   --  11.5*  --   HCT 34.6*  --   --   --  35.9*  --   PLT 175  --   --   --  200  --   HEPARINUNFRC  --   --   --  0.20*  --  0.33  CREATININE 4.30*  --   --   --  5.19*  --   TROPONINIHS 1,125* 2,448* 9,187*  --   --   --     Estimated Creatinine Clearance: 13 mL/min (A) (by C-G formula based on SCr of 5.19 mg/dL (H)).   Medical History: Past Medical History:  Diagnosis Date   GERD (gastroesophageal reflux disease)    Glaucoma    Hypertension    Macular degeneration    Myocardial infarction [facility])    1999   Prostate cancer (HCC) 02/2018   Renal insufficiency    Stroke (HCC)    no deficits; had stroke while trying to place "stents in legs".    Medications:  No chronic anticoagulation PTA per med rec and chart review  Assessment: 82 yo male presented to ED with c/o chest pain post HD today. Patient is in atrial fib  RVR. Elevated troponin. Not on any oral anticoagulants. Pharmacy asked to start heparin.   Goal of Therapy:  Heparin level 0.3-0.7 units/ml Monitor platelets by anticoagulation protocol: Yes   Date Time HL  Rate/Comment 10/3 0015 0.20  Subtherapeutic; 1150>1300 10/3 0945 0.33  Therapeutic x 1   Plan:  Continue heparin infusion at 1300 units/hour. Check HL in 8 hours. Continue to monitor CBC daily while on heparin infusion.   Will M. Dareen Piano, PharmD Clinical Pharmacist 11/13/2022 10:59 AM

## 2022-11-13 NOTE — Progress Notes (Signed)
PHARMACY - ANTICOAGULATION CONSULT NOTE  Pharmacy Consult for Heparin Indication: chest pain/ACS  No Known Allergies  Patient Measurements: Height: 6\' 2"  (188 cm) Weight: 92.6 kg (204 lb 2.3 oz) IBW/kg (Calculated) : 82.2 HEPARIN DW (KG): 92.6   Vital Signs: Temp: 97.7 F (36.5 C) (10/03 1700) Temp Source: Axillary (10/03 1700) BP: 137/52 (10/03 1400) Pulse Rate: 47 (10/03 1600)  Labs: Recent Labs    11/12/22 1508 11/12/22 1713 11/12/22 2143 11/13/22 0015 11/13/22 0313 11/13/22 0945 11/13/22 1813  HGB 10.8*  --   --   --  11.5*  --   --   HCT 34.6*  --   --   --  35.9*  --   --   PLT 175  --   --   --  200  --   --   HEPARINUNFRC  --   --   --  0.20*  --  0.33 0.40  CREATININE 4.30*  --   --   --  5.19*  --   --   TROPONINIHS 1,125* 2,448* 9,187*  --   --   --   --     Estimated Creatinine Clearance: 13 mL/min (A) (by C-G formula based on SCr of 5.19 mg/dL (H)).   Medical History: Past Medical History:  Diagnosis Date   GERD (gastroesophageal reflux disease)    Glaucoma    Hypertension    Macular degeneration    Myocardial infarction [facility])    1999   Prostate cancer (HCC) 02/2018   Renal insufficiency    Stroke (HCC)    no deficits; had stroke while trying to place "stents in legs".    Medications:  No chronic anticoagulation PTA per med rec and chart review  Assessment: 82 yo male presented to ED with c/o chest pain post HD today. Patient is in atrial fib  RVR. Elevated troponin. Not on any oral anticoagulants. Pharmacy asked to start heparin.   Goal of Therapy:  Heparin level 0.3-0.7 units/ml Monitor platelets by anticoagulation protocol: Yes   Date Time HL  Rate/Comment 10/3 0015 0.20  Subtherapeutic; 1150>1300 10/3 0945 0.33  Therapeutic x 1  10/3 1813 0.40  Therapeutic x 2   Plan:  Continue heparin infusion at 1300 units/hour. Check HL with AM labs Continue to monitor CBC daily while on heparin infusion.   Sharen Hones, PharmD,  BCPS Clinical Pharmacist   11/13/2022 6:48 PM

## 2022-11-13 NOTE — Progress Notes (Signed)
*  PRELIMINARY RESULTS* Echocardiogram 2D Echocardiogram has not been performed. Patient just got up in chair.  Stacey Drain 11/13/2022, 10:45 AM

## 2022-11-13 NOTE — Progress Notes (Signed)
*  PRELIMINARY RESULTS* Echocardiogram 2D Echocardiogram has been performed.  Stacey Drain 11/13/2022, 3:54 PM

## 2022-11-13 NOTE — Progress Notes (Signed)
PHARMACY - ANTICOAGULATION CONSULT NOTE  Pharmacy Consult for Heparin Indication: chest pain/ACS  No Known Allergies  Patient Measurements: Height: 6\' 2"  (188 cm) Weight: 92.6 kg (204 lb 2.3 oz) IBW/kg (Calculated) : 82.2 HEPARIN DW (KG): 92.6   Vital Signs: Temp: 98.1 F (36.7 C) (10/02 2215) Temp Source: Oral (10/02 2215) BP: 145/84 (10/03 0000) Pulse Rate: 79 (10/03 0000)  Labs: Recent Labs    11/12/22 1508 11/12/22 1713 11/12/22 2143 11/13/22 0015  HGB 10.8*  --   --   --   HCT 34.6*  --   --   --   PLT 175  --   --   --   HEPARINUNFRC  --   --   --  0.20*  CREATININE 4.30*  --   --   --   TROPONINIHS 1,125* 2,448* 9,187*  --     Estimated Creatinine Clearance: 15.7 mL/min (A) (by C-G formula based on SCr of 4.3 mg/dL (H)).   Medical History: Past Medical History:  Diagnosis Date   GERD (gastroesophageal reflux disease)    Glaucoma    Hypertension    Macular degeneration    Myocardial infarction [facility])    1999   Prostate cancer (HCC) 02/2018   Renal insufficiency    Stroke (HCC)    no deficits; had stroke while trying to place "stents in legs".    Medications:  See med rec  Assessment: 82 yo male presented to ED with c/o chest pain post HD today. Patient is in atrial fib  RVR. Elevated troponin. Not on any oral anticoagulants. Pharmacy asked to start heparin  10/3 AM update:  Heparin level sub-therapeutic  Goal of Therapy:  Heparin level 0.3-0.7 units/ml Monitor platelets by anticoagulation protocol: Yes   Plan:  Inc heparin to 1300 units/hr 1000 heparin level  Abran Duke, PharmD, BCPS Clinical Pharmacist Phone: [phone]

## 2022-11-13 NOTE — Consult Note (Signed)
[facility] Renal Consultation Note    Indication for Consultation:  Management of ESRD/hemodialysis; anemia, hypertension/volume and secondary hyperparathyroidism  HPI: Ronald [name] is a 82 y.o. male with a PMH significant for CAD s/p CABG, HTN, h/o CVA, cognitive impairment, and ESRD ([facility]) who presented to [facility] ED via on 11/12/22 after dialysis due to loss of balance, SOB, and chest pain.  In the ED, Bp 140/77, HR 143, SpO2 97%.  ECG with Atrial fibrillation with RVR (rate into the 140's).  He was admitted and started on heparin and diltiazem drips.  We were consulted to provide dialysis during his admission.  He currently denies any SOB, N/V/D, or chest pain.  Past Medical History:  Diagnosis Date   GERD (gastroesophageal reflux disease)    Glaucoma    Hypertension    Macular degeneration    Myocardial infarction [facility])    1999   Prostate cancer (HCC) 02/2018   Renal insufficiency    Stroke [facility])    no deficits; had stroke while trying to place "stents in legs".   Past Surgical History:  Procedure Laterality Date   ABDOMINAL AORTIC ANEURYSM REPAIR  2012   IN [place]   APPENDECTOMY     AV FISTULA PLACEMENT Left 10/16/2020   Procedure: LEFT ARM ARTERIOVENOUS (AV) FISTULA CREATION;  Surgeon: Larina Earthly, MD;  Location: [facility];  Service: Vascular;  Laterality: Left;   CORONARY ARTERY BYPASS GRAFT  01/1998   3 vessels 18 years ago   INGUINAL HERNIA REPAIR Right 09/20/2015   Procedure: REPAIR RIGHT INGUINAL HERNIA  WITH MESH;  Surgeon: Ancil Linsey, MD;  Location: [facility];  Service: General;  Laterality: Right;   INSERTION OF DIALYSIS CATHETER Right 08/02/2020   Procedure: INSERTION OF RIGHT TUNNELED INTERNAL JUGULAR  DIALYSIS CATHETER;  Surgeon: Larina Earthly, MD;  Location: [facility];  Service: Vascular;  Laterality: Right;   REMOVAL OF A DIALYSIS CATHETER N/A 03/19/2021   Procedure: MINOR REMOVAL OF A TUNNELED DIALYSIS CATHETER;  Surgeon:  Larina Earthly, MD;  Location: [facility];  Service: Vascular;  Laterality: N/A;   tumor removal from bladder     Family History:   Family History  Problem Relation Age of Onset   Cancer Father    Social History:  reports that he has quit smoking. His smoking use included cigarettes. He started smoking about 65 years ago. He has a 32.6 pack-year smoking history. He has never used smokeless tobacco. He reports that he does not drink alcohol and does not use drugs. No Known Allergies Prior to Admission medications   Medication Sig Start Date End Date Taking? Authorizing Provider  atorvastatin (LIPITOR) 80 MG tablet Take 80 mg by mouth in the morning. 07/05/19  Yes [provider]  calcium acetate (PHOSLO) 667 MG capsule Take 1,334 mg by mouth 3 (three) times daily. citracal   Yes [provider]  carvedilol (COREG) 12.5 MG tablet Take 6.25 mg by mouth 2 (two) times daily with a meal.   Yes [provider]  Magnesium 250 MG TABS Take 1 tablet by mouth at bedtime.   Yes [provider]  tamsulosin (FLOMAX) 0.4 MG CAPS capsule Take 1 capsule (0.4 mg total) by mouth daily after supper. 11/04/22  Yes McKenzie, Mardene Celeste, MD  traMADol (ULTRAM) 50 MG tablet Take 50 mg by mouth 3 (three) times daily as needed (pain.).   Yes [provider]  zolpidem (AMBIEN) 10 MG tablet Take 10  mg by mouth at bedtime.   Yes [provider]  memantine (NAMENDA) 5 MG tablet Take 1 tablet (5 mg at night) for 2 weeks, then increase to 1 tablet (5 mg) twice a day Patient not taking: Reported on 11/12/2022 08/08/21   Marcos Eke, PA-C   Current Facility-Administered Medications  Medication Dose Route Frequency Provider Last Rate Last Admin   acetaminophen (TYLENOL) tablet 650 mg  650 mg Oral Q6H PRN Emokpae, Ejiroghene E, MD       Or   acetaminophen (TYLENOL) suppository 650 mg  650 mg Rectal Q6H PRN Emokpae, Ejiroghene E, MD       aspirin EC tablet 81 mg  81 mg Oral  Daily Randall An M, PA-C   81 mg at 11/13/22 0855   atorvastatin (LIPITOR) tablet 80 mg  80 mg Oral q AM Emokpae, Ejiroghene E, MD   80 mg at 11/13/22 0646   calcium acetate (PHOSLO) capsule 1,334 mg  1,334 mg Oral TID Emokpae, Ejiroghene E, MD   1,334 mg at 11/13/22 0856   Chlorhexidine Gluconate Cloth 2 % PADS 6 each  6 each Topical Daily Emokpae, Ejiroghene E, MD   6 each at 11/13/22 0856   diltiazem (CARDIZEM) 125 mg in dextrose 5% 125 mL (1 mg/mL) infusion  5-15 mg/hr Intravenous Continuous Emokpae, Ejiroghene E, MD 10 mL/hr at 11/13/22 0744 10 mg/hr at 11/13/22 0744   heparin ADULT infusion 100 units/mL (25000 units/298mL)  1,300 Units/hr Intravenous Continuous Stevphen Rochester, RPH 13 mL/hr at 11/13/22 0745 1,300 Units/hr at 11/13/22 0745   LORazepam (ATIVAN) injection 2 mg  2 mg Intravenous Once Sharen Hones, RPH       memantine [facility]) tablet 5 mg  5 mg Oral BID Tat, Onalee Hua, MD   5 mg at 11/13/22 0856   metoprolol tartrate (LOPRESSOR) injection 5 mg  5 mg Intravenous Q5 min PRN Emokpae, Ejiroghene E, MD   5 mg at 11/12/22 2228   Oral care mouth rinse  15 mL Mouth Rinse PRN Emokpae, Ejiroghene E, MD       polyethylene glycol (MIRALAX / GLYCOLAX) packet 17 g  17 g Oral Daily PRN Emokpae, Ejiroghene E, MD       tamsulosin (FLOMAX) capsule 0.4 mg  0.4 mg Oral QPC supper Emokpae, Ejiroghene E, MD       zolpidem (AMBIEN) tablet 10 mg  10 mg Oral QHS Emokpae, Ejiroghene E, MD   10 mg at 11/12/22 2223   Labs: Basic Metabolic Panel: Recent Labs  Lab 11/07/22 1418 11/12/22 1508 11/13/22 0313  NA 135 135 132*  K 4.1 3.7 4.0  CL 96* 97* 95*  CO2 22 29 24   GLUCOSE 107* 128* 135*  BUN 45* 19 27*  CREATININE 7.73* 4.30* 5.19*  CALCIUM 9.2 8.3* 8.1*   Liver Function Tests: No results for input(s): "AST", "ALT", "ALKPHOS", "BILITOT", "PROT", "ALBUMIN" in the last 168 hours. No results for input(s): "LIPASE", "AMYLASE" in the last 168 hours. No results for input(s): "AMMONIA" in  the last 168 hours. CBC: Recent Labs  Lab 11/07/22 1418 11/12/22 1508 11/13/22 0313  WBC 6.4 6.7 8.4  HGB 12.8* 10.8* 11.5*  HCT 39.2 34.6* 35.9*  MCV 96.6 97.2 97.0  PLT 190 175 200   Cardiac Enzymes: No results for input(s): "CKTOTAL", "CKMB", "CKMBINDEX", "TROPONINI" in the last 168 hours. CBG: No results for input(s): "GLUCAP" in the last 168 hours. Iron Studies: No results for input(s): "IRON", "TIBC", "TRANSFERRIN", "FERRITIN" in the last  72 hours. Studies/Results: CT HEAD WO CONTRAST ( )  Result Date: 11/12/2022 CLINICAL DATA:  Altered mental status EXAM: CT HEAD WITHOUT CONTRAST TECHNIQUE: Contiguous axial images were obtained from the base of the skull through the vertex without intravenous contrast. RADIATION DOSE REDUCTION: This exam was performed according to the departmental dose-optimization program which includes automated exposure control, adjustment of the mA and/or kV according to patient size and/or use of iterative reconstruction technique. COMPARISON:  MRI from 07/23/21 FINDINGS: Brain: No evidence of acute infarction, hemorrhage, hydrocephalus, extra-axial collection or mass lesion/mass effect. Chronic atrophic and ischemic changes are noted. Vascular: No hyperdense vessel or unexpected calcification. Skull: Normal. Negative for fracture or focal lesion. Sinuses/Orbits: No acute finding. Other: None. IMPRESSION: Chronic atrophic and ischemic changes stable from previous exam. No acute abnormality noted. Electronically Signed   By: Alcide Clever M.D.   On: 11/12/2022 20:42   DG Chest Port 1 View  Result Date: 11/12/2022 CLINICAL DATA:  Chest pain.  Loss of balance after dialysis. EXAM: PORTABLE CHEST 1 VIEW COMPARISON:  One-view chest x-ray 11/07/2022 FINDINGS: Heart is enlarged. Mild interstitial edema is present bilaterally. No focal airspace consolidation is present. Patient is status post median sternotomy. IMPRESSION: Cardiomegaly and mild interstitial edema  compatible with congestive heart failure. Electronically Signed   By: Marin Roberts M.D.   On: 11/12/2022 15:58    ROS: Pertinent items are noted in HPI. Physical Exam: Vitals:   11/13/22 0453 11/13/22 0500 11/13/22 0642 11/13/22 0823  BP:   (!) 152/80   Pulse:   85   Resp:   (!) 23   Temp: 98.2 F (36.8 C)   97.8 F (36.6 C)  TempSrc: Oral   Oral  SpO2:   100%   Weight:  92.6 kg    Height:          Weight change:   Intake/Output Summary (Last 24 hours) at 11/13/2022 1610 Last data filed at 11/13/2022 9604 Gross per 24 hour  Intake 215.46 ml  Output --  Net 215.46 ml   BP (!) 152/80   Pulse 85   Temp 97.8 F (36.6 C) (Oral)   Resp (!) 23   Ht 6\' 2"  (1.88 m)   Wt 92.6 kg   SpO2 100%   BMI 26.21 kg/m  General appearance: no distress Head: Normocephalic, without obvious abnormality, atraumatic Eyes: negative findings: lids and lashes normal, conjunctivae and sclerae normal, and corneas clear Resp: clear to auscultation bilaterally Cardio: regular rate and rhythm, S1, S2 normal, no murmur, click, rub or gallop GI: soft, non-tender; bowel sounds normal; no masses,  no organomegaly Extremities: extremities normal, atraumatic, no cyanosis or edema and left AVF +T/B Dialysis Access:  Dialysis Orders: Center: [facility]  on MF . EDW 94kg HD Bath 3K/2.5Ca  Time 3:15 Heparin none. Access LAVF BFR 400 DFR 600    Calcitriol 0.5 mcg po/HD Micera  30 mg IV every 4 weeks  Venofer  50 mg IV qweek    Assessment/Plan:  Atrial fibrillation with RVR - currently rate controlled.  Continue with cardizem and heparin.  Cardiology following  ESRD -   plan for HD tomorrow.  He only gets dialysis twice a week, and may benefit from 3 treatments/week if volume is an issue.   Hypertension/volume  - BNP elevated.  Currently without SOB.  Will UF as tolerated with HD.  He is currently below his edw and will need to be adjusted at time of discharge  Anemia  -  stable  Metabolic  bone disease -   continue with home meds  Nutrition -  renal diet NSTEMI - Cardiology following.  Irena Cords, MD [facility], [facility] 11/13/2022, 9:23 AM

## 2022-11-13 NOTE — Consult Note (Addendum)
Cardiology Consultation   Patient ID: Ronald [name] MRN: [id number]; DOB: [birth year]  Admit date: 11/12/2022 Date of Consult: 11/13/2022  PCP:  [name], MD   [facility] Providers Cardiologist:  Charlton Haws, MD        Patient Profile:   Ronald [name] is a 82 y.o. male with a hx of CAD (s/p CABG in 1999 with cath in 2009 showing patent LIMA, patent SVG-LCx and native LCx with RCA collaterals by review of notes, NST in 09/2020 showing scar with no ischemia), AAA (s/p endovascular repair in 2013, aneurysm sac at 10.2 cm by CT in 03/2022 and followed by Vascular Surgery at [facility] --> decided against repeat surgery per discussions with wife), SVT, HTN, HLD, glaucoma, prior CVA, dementia and ESRD who is being seen 11/13/2022 for the evaluation of atrial fibrillation with RVR and NSTEMI at the request of Dr. Mariea Clonts.  History of Present Illness:   [name] initially presented to the ED on 11/07/2022 for evaluation of chest pain and shortness of breath for the past 2 to 3 days. He had missed a session of dialysis.  roponin values were mildly elevated at 50 and 45 and EKG at that time appeared to show atrial fibrillation with known RBBB and PVC's. Admission was recommended along with urgent dialysis but he left AMA. Presented back to the ED the following day but appears he left prior to being evaluated.  He presented back to the ED on 11/12/2022 after having finished dialysis due to reports of worsening chest pain, nausea and vomiting. His wife reported that upon finishing dialysis, he had garbled speech, difficulty balancing worsening confusion. In talking with the patient today, he is unable to elaborate on the events which brought him to the hospital yesterday. Says that he feels well at this time but denies any current pain. Denies any shortness of breath. He is able to tell me he is in a hospital and that it is 2024 but is unable to recall what city he is in or what  month it is. I spoke with his wife and she reports that he has been experiencing intermittent episodes of chest pain over the past week and this can occur at rest or with activity. He has also reported worsening orthopnea and PND at night but refuses to change positions or sleep in the recliner. She says that upon returning from dialysis yesterday, he was very confused beyond his usual baseline and had significant weakness and garbled speech. This prompted them to seek evaluation. She reports that his memory has significantly declined over the past 6+ months.  ays that he sometimes forgets that he just consumed breakfast.  Initial labs showed WBC 6.7, Hgb 10.8, platelets 175, Na+ 135, K+ 3.7 and creatinine 4.30.  BNP 1510. Initial and repeat Hs Troponin values elevated to 1125, 2448 and 9187. CXR showing cardiomegaly and mild interstitial edema consistent with CHF. CT Head showed chronic atrophic and ischemic changes but no acute abnormalities. EKG showed atrial fibrillation with RVR, heart rate 133 with known RBBB.  He was started on IV Heparin and IV Cardizem at the time of admission. Also received a dose of IV Lopressor around 2230 on 10/2. He has been continued on Atorvastatin 80 mg daily and did receive Haldol yesterday evening.  Past Medical History:  Diagnosis Date   GERD (gastroesophageal reflux disease)    Glaucoma    Hypertension    Macular degeneration    Myocardial infarction (HCC)  1999   Prostate cancer (HCC) 02/2018   Renal insufficiency    Stroke [facility])    no deficits; had stroke while trying to place "stents in legs".    Past Surgical History:  Procedure Laterality Date   ABDOMINAL AORTIC ANEURYSM REPAIR  2012   IN [place]   APPENDECTOMY     AV FISTULA PLACEMENT Left 10/16/2020   Procedure: LEFT ARM ARTERIOVENOUS (AV) FISTULA CREATION;  Surgeon: Larina Earthly, MD;  Location: [facility];  Service: Vascular;  Laterality: Left;   CORONARY ARTERY BYPASS GRAFT  01/1998   3  vessels 18 years ago   INGUINAL HERNIA REPAIR Right 09/20/2015   Procedure: REPAIR RIGHT INGUINAL HERNIA  WITH MESH;  Surgeon: Ancil Linsey, MD;  Location: [facility];  Service: General;  Laterality: Right;   INSERTION OF DIALYSIS CATHETER Right 08/02/2020   Procedure: INSERTION OF RIGHT TUNNELED INTERNAL JUGULAR  DIALYSIS CATHETER;  Surgeon: Larina Earthly, MD;  Location: [facility];  Service: Vascular;  Laterality: Right;   REMOVAL OF A DIALYSIS CATHETER N/A 03/19/2021   Procedure: MINOR REMOVAL OF A TUNNELED DIALYSIS CATHETER;  Surgeon: Larina Earthly, MD;  Location: [facility];  Service: Vascular;  Laterality: N/A;   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 Authorizing Provider  atorvastatin (LIPITOR) 80 MG tablet Take 80 mg by mouth in the morning. 07/05/19  Yes [provider]  calcium acetate (PHOSLO) 667 MG capsule Take 1,334 mg by mouth 3 (three) times daily. citracal   Yes [provider]  carvedilol (COREG) 12.5 MG tablet Take 6.25 mg by mouth 2 (two) times daily with a meal.   Yes [provider]  Magnesium 250 MG TABS Take 1 tablet by mouth at bedtime.   Yes [provider]  tamsulosin (FLOMAX) 0.4 MG CAPS capsule Take 1 capsule (0.4 mg total) by mouth daily after supper. 11/04/22  Yes McKenzie, Mardene Celeste, MD  traMADol (ULTRAM) 50 MG tablet Take 50 mg by mouth 3 (three) times daily as needed (pain.).   Yes [provider]  zolpidem (AMBIEN) 10 MG tablet Take 10 mg by mouth at bedtime.   Yes [provider]  memantine (NAMENDA) 5 MG tablet Take 1 tablet (5 mg at night) for 2 weeks, then increase to 1 tablet (5 mg) twice a day Patient not taking: Reported on 11/12/2022 08/08/21   Marcos Eke, PA-C    Inpatient Medications: Scheduled Meds:  aspirin EC  81 mg Oral Daily   atorvastatin  80 mg Oral q AM   calcium acetate  1,334 mg Oral TID   Chlorhexidine  Gluconate Cloth  6 each Topical Daily   LORazepam  2 mg Intravenous Once   memantine  5 mg Oral BID   tamsulosin  0.4 mg Oral QPC supper   zolpidem  10 mg Oral QHS   Continuous Infusions:  diltiazem (CARDIZEM) infusion 10 mg/hr (11/13/22 0744)   heparin 1,300 Units/hr (11/13/22 0745)   PRN Meds: acetaminophen **OR** acetaminophen, metoprolol tartrate, mouth rinse, polyethylene glycol  Allergies:   No Known Allergies  Social History:   Social History   Socioeconomic History   Marital status: Married    Spouse name: Not on file   Number of children: Not on file   Years of education: Not on file   Highest education level: Not on file  Occupational History  Not on file  Tobacco Use   Smoking status: Former    Current packs/day: 0.50    Average packs/day: 0.5 packs/day for 65.3 years (32.6 ttl pk-yrs)    Types: Cigarettes    Start date: 08/07/1957   Smokeless tobacco: Never   Tobacco comments:    on chantix now  Vaping Use   Vaping status: Never Used  Substance and Sexual Activity   Alcohol use: No   Drug use: No   Sexual activity: Yes  Other Topics Concern   Not on file  Social History Narrative   Right handed   Drinks caffeine   One story home    Family History:    Family History  Problem Relation Age of Onset   Cancer Father      ROS:  Please see the history of present illness.   All other ROS reviewed and negative.     Physical Exam/Data:   Vitals:   11/13/22 0453 11/13/22 0500 11/13/22 0642 11/13/22 0823  BP:   (!) 152/80   Pulse:   85   Resp:   (!) 23   Temp: 98.2 F (36.8 C)   97.8 F (36.6 C)  TempSrc: Oral   Oral  SpO2:   100%   Weight:  92.6 kg    Height:        Intake/Output Summary (Last 24 hours) at 11/13/2022 0847 Last data filed at 11/13/2022 0429 Gross per 24 hour  Intake 215.46 ml  Output --  Net 215.46 ml      11/13/2022    5:00 AM 11/12/2022   10:15 PM 11/12/2022    4:00 PM  Last 3 Weights  Weight (lbs) 204 lb 2.3 oz  204 lb 2.3 oz 207 lb 14.3 oz  Weight (kg) 92.6 kg 92.6 kg 94.3 kg     Body mass index is 26.21 kg/m.  General: Pleasant, elderly male appearing in no acute distress HEENT: normal Neck: no JVD Vascular: No carotid bruits; Distal pulses 2+ bilaterally Cardiac:  normal S1, S2; RRR; no murmur  Lungs:  mild rales along bases bilaterally.  Abd: soft, nontender, no hepatomegaly  Ext: no pitting edema Musculoskeletal:  No deformities, BUE and BLE strength normal and equal Skin: warm and dry  Neuro:  CNs 2-12 intact, no focal abnormalities noted Psych:  Normal affect   EKG:  The EKG was personally reviewed and demonstrates: Atrial fibrillation with RVR, heart rate 133 with known RBBB.  Telemetry:  Telemetry was personally reviewed and demonstrates: Appears to have converted to normal sinus rhythm before 0600 and is in NSR with HR in the 70's to 80's (will obtain EKG to confirm).   Relevant CV Studies:  Echocardiogram: 09/2020 IMPRESSIONS     1. Left ventricular ejection fraction, by estimation, is 50 to 55%. The  left ventricle has low normal function. The left ventricle demonstrates  regional wall motion abnormalities (see scoring diagram/findings for  description). The left ventricular  internal cavity size was mildly dilated. There is mild left ventricular  hypertrophy. Left ventricular diastolic parameters are consistent with  Grade I diastolic dysfunction (impaired relaxation).   2. Right ventricular systolic function is normal. The right ventricular  size is normal. Tricuspid regurgitation signal is inadequate for assessing  PA pressure.   3. The mitral valve is grossly normal. No evidence of mitral valve  regurgitation.   4. The aortic valve is tricuspid. Aortic valve regurgitation is mild.  Aortic regurgitation PHT measures 545 msec.   5.  The inferior vena cava is normal in size with greater than 50%  respiratory variability, suggesting right atrial pressure of 3 mmHg.    Laboratory Data:  High Sensitivity Troponin:   Recent Labs  Lab 11/07/22 1418 11/07/22 1540 11/12/22 1508 11/12/22 1713 11/12/22 2143  TROPONINIHS 50* 45* 1,125* 2,448* 9,187*     Chemistry Recent Labs  Lab 11/07/22 1418 11/12/22 1508 11/13/22 0313  NA 135 135 132*  K 4.1 3.7 4.0  CL 96* 97* 95*  CO2 22 29 24   GLUCOSE 107* 128* 135*  BUN 45* 19 27*  CREATININE 7.73* 4.30* 5.19*  CALCIUM 9.2 8.3* 8.1*  MG  --  2.0  --   GFRNONAA 6* 13* 10*  ANIONGAP 17* 9 13    No results for input(s): "PROT", "ALBUMIN", "AST", "ALT", "ALKPHOS", "BILITOT" in the last 168 hours. Lipids No results for input(s): "CHOL", "TRIG", "HDL", "LABVLDL", "LDLCALC", "CHOLHDL" in the last 168 hours.  Hematology Recent Labs  Lab 11/07/22 1418 11/12/22 1508 11/13/22 0313  WBC 6.4 6.7 8.4  RBC 4.06* 3.56* 3.70*  HGB 12.8* 10.8* 11.5*  HCT 39.2 34.6* 35.9*  MCV 96.6 97.2 97.0  MCH 31.5 30.3 31.1  MCHC 32.7 31.2 32.0  RDW 15.3 15.1 15.2  PLT 190 175 200   Thyroid No results for input(s): "TSH", "FREET4" in the last 168 hours.  BNP Recent Labs  Lab 11/07/22 1540 11/12/22 1508  BNP 1,655.0* 1,510.0*    DDimer No results for input(s): "DDIMER" in the last 168 hours.   Radiology/Studies:  CT HEAD WO CONTRAST ( )  Result Date: 11/12/2022 CLINICAL DATA:  Altered mental status EXAM: CT HEAD WITHOUT CONTRAST TECHNIQUE: Contiguous axial images were obtained from the base of the skull through the vertex without intravenous contrast. RADIATION DOSE REDUCTION: This exam was performed according to the departmental dose-optimization program which includes automated exposure control, adjustment of the mA and/or kV according to patient size and/or use of iterative reconstruction technique. COMPARISON:  MRI from 07/23/21 FINDINGS: Brain: No evidence of acute infarction, hemorrhage, hydrocephalus, extra-axial collection or mass lesion/mass effect. Chronic atrophic and ischemic changes are noted.  Vascular: No hyperdense vessel or unexpected calcification. Skull: Normal. Negative for fracture or focal lesion. Sinuses/Orbits: No acute finding. Other: None. IMPRESSION: Chronic atrophic and ischemic changes stable from previous exam. No acute abnormality noted. Electronically Signed   By: Alcide Clever M.D.   On: 11/12/2022 20:42   DG Chest Port 1 View  Result Date: 11/12/2022 CLINICAL DATA:  Chest pain.  Loss of balance after dialysis. EXAM: PORTABLE CHEST 1 VIEW COMPARISON:  One-view chest x-ray 11/07/2022 FINDINGS: Heart is enlarged. Mild interstitial edema is present bilaterally. No focal airspace consolidation is present. Patient is status post median sternotomy. IMPRESSION: Cardiomegaly and mild interstitial edema compatible with congestive heart failure. Electronically Signed   By: Marin Roberts M.D.   On: 11/12/2022 15:58     Assessment and Plan:   1. NSTEMI - Presented with episodes of chest pain throughout the past week and was found to be in atrial fibrillation with RVR.  Cardiac enzymes have been elevated to 1125, 2448 and 9187. While you would expect to see some elevation with RVR, this likely exceeds a level consistent with demand ischemia and his symptoms leading up to admission are concerning for angina. - In the setting of the patient's dementia, I did call his wife this morning and reviewed findings in regards to his NSTEMI. She reports he previously informed her he did not wish  to undergo further procedures and she wishes to pursue medical management and for him to remain at [facility] as she has difficulty traveling to [place] and reports he becomes very anxious if he is unable to see her on a regular basis. She does not want him to undergo a cardiac catheterization at this time. Given his dementia and multiple medical issues, medical management certainly seems reasonable. - A repeat echocardiogram is pending and will help guide medication management. Will start ASA 81 mg  daily and continue Atorvastatin 80 mg daily. Pending echocardiogram results, may need to switch Cardizem to beta-blocker therapy and adjust GDMT if EF is reduced. Will continue with IV Heparin for at least 48 hours.  2. Atrial Fibrillation with RVR - This has been a new diagnosis for the patient and he was started on IV Cardizem and recently converted to normal sinus rhythm. Would switch to PO Cardizem or beta-blocker therapy pending echocardiogram results. Can likely switch to Coreg since on this PTA.  - He is currently on IV Heparin for anticoagulation given his NSTEMI. Can switch to a DOAC prior to discharge.  3. AAA - He previously underwent endovascular leak repair in 2013 and by review of notes, his aneurysmal sac was measuring at 10.2 cm by CT in 03/2022. By review of Vascular Surgery notes in Care Everywhere from 04/2022 he was felt to have a 50% chance of rupture within the next year and in talking with the wife today, he declined repair  He is DNR and Palliative Care consult would be reasonable.   4. HTN - He was on Coreg 6.25 mg twice daily prior to admission. Currently on IV Cardizem. Will review with Dr. Wyline Mood but given that he has converted back to normal sinus rhythm (awaiting confirmation EKG), can likely switch back to Coreg. Goal SBP would be less than 120 given his aneurysm.   5. HLD - Recheck FLP. He has been continued on Atorvastatin 80mg  daily.   6. ESRD - Undergoing HD - MWF schedule. Nephrology has been consulted by the admitting team.   7. Dementia - He is able to tell me it is 2024 and that he is in the hospital but unable to recall the month or town that we are in. Wife confirms worsening memory issues over the past 6+ months as well. He has been continued on Namenda.   Risk Assessment/Risk Scores:     TIMI Risk Score for Unstable Angina or Non-ST Elevation MI:   The patient's TIMI risk score is 6, which indicates a 41% risk of all cause mortality, new or  recurrent myocardial infarction or need for urgent revascularization in the next 14 days.    CHA2DS2-VASc Score = 6   This indicates a 9.7% annual risk of stroke. The patient's score is based upon: CHF History: 0 HTN History: 1 Diabetes History: 0 Stroke History: 2 Vascular Disease History: 1 Age Score: 2 Gender Score: 0    For questions or updates, please contact [facility] Please consult [email or web address] for contact info under   Signed, Ellsworth Lennox, PA-C  11/13/2022 8:47 AM  Attending note Patient seen and discussed with PA [place] Ouch, I agree with her documentation. 82 yo male history of CAD with CABG in 1999, HTN, AAA with prior repair now with endoleak, ESRD, HTN, HLD, SVT, dementia, presents with chest pain    K 3.7 Cr 4.3 BUN 19 WBC 6.7 Hgb 10.8 Plt 175 Mg 2 BNP 1510 Trop 1125-->2448-->9187 EKG afib RVR,  LAFB/RBBB CXR: cardiomegaly, mild edema CT head: no acute process Echo pending  Afib with RVR - new diagnosis this admission - started on cardizem gtt, has converted back to SR  -CHADS2Vasc score 6, initially started on hep gtt in setting of NSTEMI, transition to DOAC closer to d/c.  - back in SR. D/c IV cardizem, start back his home coreg at higher dose of 12.5mg  bid.   2.NSTEMI - trop up to 9187, EKG without clear ischemic changes - echo is pending - started on hep gtt. Other medical therapy with ASA 81, atorva 80, coreg 12.5mg  bid. Pending bp's can start ARB this admit - extensive discussion with patient's wife by PA Strader as documented above, patient had previously indicated would not invasive procedure and family agrees with this as well. Patient advanced age and comorbidities including dementia, would agree that medical management is best option. Plan 48 hrs of heparin, medical therapy.  - f/u echo, pending EF may be additional mediaction changes.    3. ESRD - per nephrology  4. History of AAA - as described above by PA Strader, declined  prior repair. High risk of rupture over time.    Dina Rich MD

## 2022-11-13 NOTE — Hospital Course (Signed)
82 year old male with a history of coronary disease status post CABG, ESRD, hypertension, stroke, chronic impairment presenting with 4 to 5-day history of chest pain, orthopnea, and associated nausea and vomiting.  The patient went to dialysis on 11/12/22 and stayed the whole time.  The patient was confused at this time.  Spouse states that at baseline the patient has some cognitive impairment.  There is not been any fevers, chills, headache, neck pain abdominal pain.  There is no hematochezia or melena. In the ED, the patient was afebrile and hemodynamically stable.  Oxygen saturation 100% room air.  The patient was noted to have atrial fibrillation with RVR with heart rate in the 140s.  He was started on diltiazem drip. WBC 6.7, hemoglobin 10.8, platelets 200.  Sodium 132, potassium 4.0, bicarbonate 24, serum creatinine 5.19.  Cardiology and nephrology were consulted to assist with management.

## 2022-11-14 DIAGNOSIS — I214 Non-ST elevation (NSTEMI) myocardial infarction: Secondary | ICD-10-CM | POA: Diagnosis not present

## 2022-11-14 DIAGNOSIS — I4891 Unspecified atrial fibrillation: Secondary | ICD-10-CM | POA: Diagnosis not present

## 2022-11-14 DIAGNOSIS — N186 End stage renal disease: Secondary | ICD-10-CM | POA: Diagnosis not present

## 2022-11-14 LAB — CBC
HCT: 33.9 % — ABNORMAL LOW (ref 39.0–52.0)
Hemoglobin: 11 g/dL — ABNORMAL LOW (ref 13.0–17.0)
MCH: 31.4 pg (ref 26.0–34.0)
MCHC: 32.4 g/dL (ref 30.0–36.0)
MCV: 96.9 fL (ref 80.0–100.0)
Platelets: 183 10*3/uL (ref 150–400)
RBC: 3.5 MIL/uL — ABNORMAL LOW (ref 4.22–5.81)
RDW: 15.4 % (ref 11.5–15.5)
WBC: 7.6 10*3/uL (ref 4.0–10.5)
nRBC: 0 % (ref 0.0–0.2)

## 2022-11-14 LAB — RENAL FUNCTION PANEL
Albumin: 3.6 g/dL (ref 3.5–5.0)
Anion gap: 16 — ABNORMAL HIGH (ref 5–15)
BUN: 39 mg/dL — ABNORMAL HIGH (ref 8–23)
CO2: 23 mmol/L (ref 22–32)
Calcium: 8.6 mg/dL — ABNORMAL LOW (ref 8.9–10.3)
Chloride: 95 mmol/L — ABNORMAL LOW (ref 98–111)
Creatinine, Ser: 7 mg/dL — ABNORMAL HIGH (ref 0.61–1.24)
GFR, Estimated: 7 mL/min — ABNORMAL LOW (ref >60)
Glucose, Bld: 107 mg/dL — ABNORMAL HIGH (ref 70–99)
Phosphorus: 4.5 mg/dL (ref 2.5–4.6)
Potassium: 3.6 mmol/L (ref 3.5–5.1)
Sodium: 134 mmol/L — ABNORMAL LOW (ref 135–145)

## 2022-11-14 LAB — HEPATITIS B SURFACE ANTIBODY, QUANTITATIVE: Hep B S AB Quant (Post): 32.5 m[IU]/mL

## 2022-11-14 LAB — TSH: TSH: 3.53 u[IU]/mL (ref 0.350–4.500)

## 2022-11-14 LAB — HEPARIN LEVEL (UNFRACTIONATED): Heparin Unfractionated: 0.27 [IU]/mL — ABNORMAL LOW (ref 0.30–0.70)

## 2022-11-14 LAB — LIPID PANEL
Cholesterol: 157 mg/dL (ref 0–200)
HDL: 63 mg/dL (ref >40)
LDL Cholesterol: 80 mg/dL (ref 0–99)
Total CHOL/HDL Ratio: 2.5 {ratio}
Triglycerides: 71 mg/dL (ref <150)
VLDL: 14 mg/dL (ref 0–40)

## 2022-11-14 MED ORDER — ZOLPIDEM TARTRATE 5 MG PO TABS
5.0000 mg | ORAL_TABLET | Freq: Every day | ORAL | Status: DC
  Administered 2022-11-14 – 2022-11-16 (×3): 5 mg via ORAL
  Filled 2022-11-14 (×3): qty 1

## 2022-11-14 NOTE — Progress Notes (Signed)
Patient ID: [name], male   DOB: 04-Sep-1940, 82 y.o.   MRN: [id number] S: patient became very agitated yesterday and required 4 point restraints.   O:BP (!) 154/77   Pulse 83   Temp 97.7 F (36.5 C)   Resp (!) 25   Ht 6\' 2"  (1.88 m)   Wt 92.2 kg   SpO2 100%   BMI 26.10 kg/m  No intake or output data in the 24 hours ending 11/14/22 0938 Intake/Output: I/O last 3 completed shifts: In: 215.5 [I.V.:215.5] Out: -   Intake/Output this shift:  No intake/output data recorded. Weight change: -2.1 kg WUJ:WJXBJYNW, NAD GNF:AOZHY at 117 Resp: CTA Abd: +BS, soft, NT/ND Ext: no edema, LAVF +T/B  Recent Labs  Lab 11/07/22 1418 11/12/22 1508 11/13/22 0313 11/14/22 0904  NA 135 135 132* 134*  K 4.1 3.7 4.0 3.6  CL 96* 97* 95* 95*  CO2 22 29 24 23   GLUCOSE 107* 128* 135* 107*  BUN 45* 19 27* 39*  CREATININE 7.73* 4.30* 5.19* 7.00*  ALBUMIN  --   --   --  3.6  CALCIUM 9.2 8.3* 8.1* 8.6*  PHOS  --   --   --  4.5   Liver Function Tests: Recent Labs  Lab 11/14/22 0904  ALBUMIN 3.6   No results for input(s): "LIPASE", "AMYLASE" in the last 168 hours. No results for input(s): "AMMONIA" in the last 168 hours. CBC: Recent Labs  Lab 11/07/22 1418 11/12/22 1508 11/13/22 0313 11/14/22 0522  WBC 6.4 6.7 8.4 7.6  HGB 12.8* 10.8* 11.5* 11.0*  HCT 39.2 34.6* 35.9* 33.9*  MCV 96.6 97.2 97.0 96.9  PLT 190 175 200 183   Cardiac Enzymes: No results for input(s): "CKTOTAL", "CKMB", "CKMBINDEX", "TROPONINI" in the last 168 hours. CBG: No results for input(s): "GLUCAP" in the last 168 hours.  Iron Studies: No results for input(s): "IRON", "TIBC", "TRANSFERRIN", "FERRITIN" in the last 72 hours. Studies/Results: ECHOCARDIOGRAM COMPLETE  Result Date: 11/13/2022    ECHOCARDIOGRAM REPORT   Patient Name:   Ronald [name] Date of Exam: 11/13/2022 Medical Rec #:  [id number]            Height:       74.0 in Accession #:    [id number]           Weight:       204.1 lb Date of  Birth:  1940-04-22            BSA:          2.193 m Patient Age:    82 years             BP:           152/80 mmHg Patient Gender: M                    HR:           85 bpm. [facility] Procedure: 2D Echo, Cardiac Doppler and Color Doppler Indications:    Congestive Heart Failure I50.9  History:        Patient has prior history of Echocardiogram examinations, most                 recent 09/13/2020. CAD and Previous Myocardial Infarction, Prior                 CABG, TIA; Risk Factors:Hypertension and Dyslipidemia.  Sonographer:    Celesta Gentile RCS Referring Phys: [phone] Heloise Beecham  [place]  Sonographer Comments: Image acquisition challenging due to patient behavioral factors. IMPRESSIONS  1. Left ventricular ejection fraction, by estimation, is 40 to 45%. The left ventricle has mildly decreased function. The left ventricle demonstrates global hypokinesis. There is mild left ventricular hypertrophy. Left ventricular diastolic parameters are consistent with Grade II diastolic dysfunction (pseudonormalization). Elevated left atrial pressure.  2. Right ventricular systolic function is normal. The right ventricular size is normal. Tricuspid regurgitation signal is inadequate for assessing PA pressure.  3. Left atrial size was severely dilated.  4. Right atrial size was mildly dilated.  5. The mitral valve is abnormal. Mild mitral valve regurgitation. No evidence of mitral stenosis.  6. The tricuspid valve is abnormal.  7. The aortic valve has an indeterminant number of cusps. There is moderate calcification of the aortic valve. There is moderate thickening of the aortic valve. Aortic valve regurgitation is not visualized. Moderate aortic valve stenosis. Aortic valve mean gradient measures 18.8 mmHg. Aortic valve peak gradient measures 44.4 mmHg. Aortic valve area, by VTI measures 1.10 cm. DI 0.29  8. The inferior vena cava is dilated in size with <50% respiratory variability, suggesting right atrial pressure  of 15 mmHg. FINDINGS  Left Ventricle: Left ventricular ejection fraction, by estimation, is 40 to 45%. The left ventricle has mildly decreased function. The left ventricle demonstrates global hypokinesis. The left ventricular internal cavity size was normal in size. There is  mild left ventricular hypertrophy. Left ventricular diastolic parameters are consistent with Grade II diastolic dysfunction (pseudonormalization). Elevated left atrial pressure. Right Ventricle: The right ventricular size is normal. Right vetricular wall thickness was not well visualized. Right ventricular systolic function is normal. Tricuspid regurgitation signal is inadequate for assessing PA pressure. Left Atrium: Left atrial size was severely dilated. Right Atrium: Right atrial size was mildly dilated. Pericardium: There is no evidence of pericardial effusion. Mitral Valve: The mitral valve is abnormal. Mild mitral valve regurgitation. No evidence of mitral valve stenosis. Tricuspid Valve: The tricuspid valve is abnormal. Tricuspid valve regurgitation is mild . No evidence of tricuspid stenosis. Aortic Valve: The aortic valve has an indeterminant number of cusps. There is moderate calcification of the aortic valve. There is moderate thickening of the aortic valve. There is moderate aortic valve annular calcification. Aortic valve regurgitation is not visualized. Moderate aortic stenosis is present. Aortic valve mean gradient measures 18.8 mmHg. Aortic valve peak gradient measures 44.4 mmHg. Aortic valve area, by VTI measures 1.10 cm. Pulmonic Valve: The pulmonic valve was not well visualized. Pulmonic valve regurgitation is not visualized. No evidence of pulmonic stenosis. Aorta: The aortic root is normal in size and structure. Venous: The inferior vena cava is dilated in size with less than 50% respiratory variability, suggesting right atrial pressure of 15 mmHg. IAS/Shunts: No atrial level shunt detected by color flow Doppler.  LEFT  VENTRICLE PLAX 2D LVIDd:         5.60 cm   Diastology LVIDs:         4.00 cm   LV e' medial:    5.25 cm/s LV PW:         1.20 cm   LV E/e' medial:  22.5 LV IVS:        1.20 cm   LV e' lateral:   5.25 cm/s LVOT diam:     2.20 cm   LV E/e' lateral: 22.5 LV SV:         63 LV SV Index:   29 LVOT Area:  3.80 cm  RIGHT VENTRICLE RV S prime:     12.70 cm/s TAPSE (M-mode): 1.8 cm LEFT ATRIUM              Index        RIGHT ATRIUM           Index LA diam:        4.80 cm  2.19 cm/m   RA Area:     22.80 cm LA Vol (A2C):   117.0 ml 53.36 ml/m  RA Volume:   69.00 ml  31.47 ml/m LA Vol (A4C):   122.0 ml 55.64 ml/m LA Biplane Vol: 133.0 ml 60.66 ml/m  AORTIC VALVE AV Area (Vmax):    1.07 cm AV Area (Vmean):   1.19 cm AV Area (VTI):     1.10 cm AV Vmax:           333.31 cm/s AV Vmean:          197.902 cm/s AV VTI:            0.578 m AV Peak Grad:      44.4 mmHg AV Mean Grad:      18.8 mmHg LVOT Vmax:         93.80 cm/s LVOT Vmean:        62.200 cm/s LVOT VTI:          0.167 m LVOT/AV VTI ratio: 0.29  AORTA Ao Root diam: 3.80 cm MITRAL VALVE MV Area (PHT): 4.80 cm     SHUNTS MV Decel Time: 158 msec     Systemic VTI:  0.17 m MR Peak grad: 112.4 mmHg    Systemic Diam: 2.20 cm MR Mean grad: 77.0 mmHg MR Vmax:      530.00 cm/s MR Vmean:     408.0 cm/s MV E velocity: 118.00 cm/s MV A velocity: 60.40 cm/s MV E/A ratio:  1.95 Dina Rich MD Electronically signed by Dina Rich MD Signature Date/Time: 11/13/2022/4:17:49 PM    Final    CT HEAD WO CONTRAST ( )  Result Date: 11/12/2022 CLINICAL DATA:  Altered mental status EXAM: CT HEAD WITHOUT CONTRAST TECHNIQUE: Contiguous axial images were obtained from the base of the skull through the vertex without intravenous contrast. RADIATION DOSE REDUCTION: This exam was performed according to the departmental dose-optimization program which includes automated exposure control, adjustment of the mA and/or kV according to patient size and/or use of iterative reconstruction  technique. COMPARISON:  MRI from 07/23/21 FINDINGS: Brain: No evidence of acute infarction, hemorrhage, hydrocephalus, extra-axial collection or mass lesion/mass effect. Chronic atrophic and ischemic changes are noted. Vascular: No hyperdense vessel or unexpected calcification. Skull: Normal. Negative for fracture or focal lesion. Sinuses/Orbits: No acute finding. Other: None. IMPRESSION: Chronic atrophic and ischemic changes stable from previous exam. No acute abnormality noted. Electronically Signed   By: Alcide Clever M.D.   On: 11/12/2022 20:42   DG Chest Port 1 View  Result Date: 11/12/2022 CLINICAL DATA:  Chest pain.  Loss of balance after dialysis. EXAM: PORTABLE CHEST 1 VIEW COMPARISON:  One-view chest x-ray 11/07/2022 FINDINGS: Heart is enlarged. Mild interstitial edema is present bilaterally. No focal airspace consolidation is present. Patient is status post median sternotomy. IMPRESSION: Cardiomegaly and mild interstitial edema compatible with congestive heart failure. Electronically Signed   By: Marin Roberts M.D.   On: 11/12/2022 15:58    aspirin EC  81 mg Oral Daily   atorvastatin  80 mg Oral q AM   calcium acetate  1,334 mg Oral  TID   carvedilol  12.5 mg Oral BID WC   Chlorhexidine Gluconate Cloth  6 each Topical Daily   Chlorhexidine Gluconate Cloth  6 each Topical Q0600   memantine  5 mg Oral BID   tamsulosin  0.4 mg Oral QPC supper   zolpidem  5 mg Oral QHS    BMET    Component Value Date/Time   NA 134 (L) 11/14/2022 0904   K 3.6 11/14/2022 0904   CL 95 (L) 11/14/2022 0904   CO2 23 11/14/2022 0904   GLUCOSE 107 (H) 11/14/2022 0904   BUN 39 (H) 11/14/2022 0904   CREATININE 7.00 (H) 11/14/2022 0904   CALCIUM 8.6 (L) 11/14/2022 0904   GFRNONAA 7 (L) 11/14/2022 0904   GFRAA 15 (L) 08/10/2019 1109   CBC    Component Value Date/Time   WBC 7.6 11/14/2022 0522   RBC 3.50 (L) 11/14/2022 0522   HGB 11.0 (L) 11/14/2022 0522   HCT 33.9 (L) 11/14/2022 0522   PLT 183  11/14/2022 0522   MCV 96.9 11/14/2022 0522   MCH 31.4 11/14/2022 0522   MCHC 32.4 11/14/2022 0522   RDW 15.4 11/14/2022 0522   LYMPHSABS 1.3 09/12/2015 1015   MONOABS 0.6 09/12/2015 1015   EOSABS 0.2 09/12/2015 1015   BASOSABS 0.0 09/12/2015 1015    Dialysis Orders: Center: [facility]  on MF . EDW 94kg HD Bath 3K/2.5Ca  Time 3:15 Heparin none. Access LAVF BFR 400 DFR 600    Calcitriol 0.5 mcg po/HD Micera  30 mg IV every 4 weeks  Venofer  50 mg IV qweek      Assessment/Plan:  Atrial fibrillation with RVR - currently rate controlled.  Continue with cardizem and heparin.  Cardiology following  ESRD -   plan for HD today and hopefully he will cooperate.  He only gets dialysis twice a week, and may benefit from 3 treatments/week if volume is an issue.   Hypertension/volume  - BNP elevated.  Currently without SOB.  Will UF as tolerated with HD.  He is currently below his edw and will need to be adjusted at time of discharge  Anemia  -  stable  Metabolic bone disease -   continue with home meds  Nutrition -  renal diet NSTEMI - Cardiology following.  Not a candidate for LHC per Cardiology.  Irena Cords, MD [facility]  The patient will not be physically seen over the weekend, however his chart will be reviewed remotely.  On call coverage will be available over the weekend if needed.

## 2022-11-14 NOTE — Progress Notes (Addendum)
Subjective:  Denies SSCP, palpitations or Dyspnea Dementia  Objective:  Vitals:   11/14/22 0100 11/14/22 0200 11/14/22 0500 11/14/22 0600  BP: (!) 143/59 (!) 165/72 (!) 155/68 (!) 162/78  Pulse: 76 (!) 105 92 (!) 118  Resp: 16 20 20  (!) 24  Temp:   97.7 F (36.5 C)   TempSrc:      SpO2: 96% 100% 100% 100%  Weight:   92.2 kg   Height:        Intake/Output from previous day: No intake or output data in the 24 hours ending 11/14/22 0718  Physical Exam: Dementia Elderly Mild exp wheezing AS murmur S2 preserved Abdomen benign Prior sternotomy No edema LUE AV fistula with thrill   Lab Results: Basic Metabolic Panel: Recent Labs    11/12/22 1508 11/13/22 0313  NA 135 132*  K 3.7 4.0  CL 97* 95*  CO2 29 24  GLUCOSE 128* 135*  BUN 19 27*  CREATININE 4.30* 5.19*  CALCIUM 8.3* 8.1*  MG 2.0  --     CBC: Recent Labs    11/13/22 0313 11/14/22 0522  WBC 8.4 7.6  HGB 11.5* 11.0*  HCT 35.9* 33.9*  MCV 97.0 96.9  PLT 200 183    Fasting Lipid Panel: Recent Labs    11/14/22 0522  CHOL 157  HDL 63  LDLCALC 80  TRIG 71  CHOLHDL 2.5     Imaging: ECHOCARDIOGRAM COMPLETE  Result Date: 11/13/2022    ECHOCARDIOGRAM REPORT   Patient Name:   Ronald [name] Date of Exam: 11/13/2022 Medical Rec #:  [id number]            Height:       74.0 in Accession #:    [id number]           Weight:       204.1 lb Date of Birth:  1940-09-07            BSA:          2.193 m Patient Age:    82 years             BP:           152/80 mmHg Patient Gender: M                    HR:           85 bpm. [facility] Procedure: 2D Echo, Cardiac Doppler and Color Doppler Indications:    Congestive Heart Failure I50.9  History:        Patient has prior history of Echocardiogram examinations, most                 recent 09/13/2020. CAD and Previous Myocardial Infarction, Prior                 CABG, TIA; Risk Factors:Hypertension and Dyslipidemia.  Sonographer:    Celesta Gentile  RCS Referring Phys: [phone] Heloise Beecham [facility]  Sonographer Comments: Image acquisition challenging due to patient behavioral factors. IMPRESSIONS  1. Left ventricular ejection fraction, by estimation, is 40 to 45%. The left ventricle has mildly decreased function. The left ventricle demonstrates global hypokinesis. There is mild left ventricular hypertrophy. Left ventricular diastolic parameters are consistent with Grade II diastolic dysfunction (pseudonormalization). Elevated left atrial pressure.  2. Right ventricular systolic function is normal. The right ventricular size is normal. Tricuspid regurgitation signal is inadequate for assessing PA pressure.  3. Left atrial size  was severely dilated.  4. Right atrial size was mildly dilated.  5. The mitral valve is abnormal. Mild mitral valve regurgitation. No evidence of mitral stenosis.  6. The tricuspid valve is abnormal.  7. The aortic valve has an indeterminant number of cusps. There is moderate calcification of the aortic valve. There is moderate thickening of the aortic valve. Aortic valve regurgitation is not visualized. Moderate aortic valve stenosis. Aortic valve mean gradient measures 18.8 mmHg. Aortic valve peak gradient measures 44.4 mmHg. Aortic valve area, by VTI measures 1.10 cm. DI 0.29  8. The inferior vena cava is dilated in size with <50% respiratory variability, suggesting right atrial pressure of 15 mmHg. FINDINGS  Left Ventricle: Left ventricular ejection fraction, by estimation, is 40 to 45%. The left ventricle has mildly decreased function. The left ventricle demonstrates global hypokinesis. The left ventricular internal cavity size was normal in size. There is  mild left ventricular hypertrophy. Left ventricular diastolic parameters are consistent with Grade II diastolic dysfunction (pseudonormalization). Elevated left atrial pressure. Right Ventricle: The right ventricular size is normal. Right vetricular wall thickness was not well  visualized. Right ventricular systolic function is normal. Tricuspid regurgitation signal is inadequate for assessing PA pressure. Left Atrium: Left atrial size was severely dilated. Right Atrium: Right atrial size was mildly dilated. Pericardium: There is no evidence of pericardial effusion. Mitral Valve: The mitral valve is abnormal. Mild mitral valve regurgitation. No evidence of mitral valve stenosis. Tricuspid Valve: The tricuspid valve is abnormal. Tricuspid valve regurgitation is mild . No evidence of tricuspid stenosis. Aortic Valve: The aortic valve has an indeterminant number of cusps. There is moderate calcification of the aortic valve. There is moderate thickening of the aortic valve. There is moderate aortic valve annular calcification. Aortic valve regurgitation is not visualized. Moderate aortic stenosis is present. Aortic valve mean gradient measures 18.8 mmHg. Aortic valve peak gradient measures 44.4 mmHg. Aortic valve area, by VTI measures 1.10 cm. Pulmonic Valve: The pulmonic valve was not well visualized. Pulmonic valve regurgitation is not visualized. No evidence of pulmonic stenosis. Aorta: The aortic root is normal in size and structure. Venous: The inferior vena cava is dilated in size with less than 50% respiratory variability, suggesting right atrial pressure of 15 mmHg. IAS/Shunts: No atrial level shunt detected by color flow Doppler.  LEFT VENTRICLE PLAX 2D LVIDd:         5.60 cm   Diastology LVIDs:         4.00 cm   LV e' medial:    5.25 cm/s LV PW:         1.20 cm   LV E/e' medial:  22.5 LV IVS:        1.20 cm   LV e' lateral:   5.25 cm/s LVOT diam:     2.20 cm   LV E/e' lateral: 22.5 LV SV:         63 LV SV Index:   29 LVOT Area:     3.80 cm  RIGHT VENTRICLE RV S prime:     12.70 cm/s TAPSE (M-mode): 1.8 cm LEFT ATRIUM              Index        RIGHT ATRIUM           Index LA diam:        4.80 cm  2.19 cm/m   RA Area:     22.80 cm LA Vol (A2C):   117.0 ml 53.36 ml/m  RA Volume:    69.00 ml  31.47 ml/m LA Vol (A4C):   122.0 ml 55.64 ml/m LA Biplane Vol: 133.0 ml 60.66 ml/m  AORTIC VALVE AV Area (Vmax):    1.07 cm AV Area (Vmean):   1.19 cm AV Area (VTI):     1.10 cm AV Vmax:           333.31 cm/s AV Vmean:          197.902 cm/s AV VTI:            0.578 m AV Peak Grad:      44.4 mmHg AV Mean Grad:      18.8 mmHg LVOT Vmax:         93.80 cm/s LVOT Vmean:        62.200 cm/s LVOT VTI:          0.167 m LVOT/AV VTI ratio: 0.29  AORTA Ao Root diam: 3.80 cm MITRAL VALVE MV Area (PHT): 4.80 cm     SHUNTS MV Decel Time: 158 msec     Systemic VTI:  0.17 m MR Peak grad: 112.4 mmHg    Systemic Diam: 2.20 cm MR Mean grad: 77.0 mmHg MR Vmax:      530.00 cm/s MR Vmean:     408.0 cm/s MV E velocity: 118.00 cm/s MV A velocity: 60.40 cm/s MV E/A ratio:  1.95 Dina Rich MD Electronically signed by Dina Rich MD Signature Date/Time: 11/13/2022/4:17:49 PM    Final    CT HEAD WO CONTRAST ( )  Result Date: 11/12/2022 CLINICAL DATA:  Altered mental status EXAM: CT HEAD WITHOUT CONTRAST TECHNIQUE: Contiguous axial images were obtained from the base of the skull through the vertex without intravenous contrast. RADIATION DOSE REDUCTION: This exam was performed according to the departmental dose-optimization program which includes automated exposure control, adjustment of the mA and/or kV according to patient size and/or use of iterative reconstruction technique. COMPARISON:  MRI from 07/23/21 FINDINGS: Brain: No evidence of acute infarction, hemorrhage, hydrocephalus, extra-axial collection or mass lesion/mass effect. Chronic atrophic and ischemic changes are noted. Vascular: No hyperdense vessel or unexpected calcification. Skull: Normal. Negative for fracture or focal lesion. Sinuses/Orbits: No acute finding. Other: None. IMPRESSION: Chronic atrophic and ischemic changes stable from previous exam. No acute abnormality noted. Electronically Signed   By: Alcide Clever M.D.   On: 11/12/2022 20:42    DG Chest Port 1 View  Result Date: 11/12/2022 CLINICAL DATA:  Chest pain.  Loss of balance after dialysis. EXAM: PORTABLE CHEST 1 VIEW COMPARISON:  One-view chest x-ray 11/07/2022 FINDINGS: Heart is enlarged. Mild interstitial edema is present bilaterally. No focal airspace consolidation is present. Patient is status post median sternotomy. IMPRESSION: Cardiomegaly and mild interstitial edema compatible with congestive heart failure. Electronically Signed   By: Marin Roberts M.D.   On: 11/12/2022 15:58    Cardiac Studies:  ECG: afib RBBB LAFB 11/12/22   Telemetry:  SR 80's with PVC/PAC  Echo: EF 40-45% moderate AS mean gradient 18.8  Medications:    aspirin EC  81 mg Oral Daily   atorvastatin  80 mg Oral q AM   calcium acetate  1,334 mg Oral TID   carvedilol  12.5 mg Oral BID WC   Chlorhexidine Gluconate Cloth  6 each Topical Daily   Chlorhexidine Gluconate Cloth  6 each Topical Q0600   memantine  5 mg Oral BID   tamsulosin  0.4 mg Oral QPC supper   zolpidem  10 mg Oral QHS  heparin 1,300 Units/hr (11/13/22 0745)    Assessment/Plan:   SEMI:  not a candidate for cath/angio with renal failure and dementia ECG non acute Patient wishes/wife no invasive Rx. Continue heparin ASA statin and beta blocker Prior CABG Suspect occlusion of old SVG from CABG PAF.  In NSR can use amiodarone if recures resume coreg. Continue heparin AAA: prior endo repair with leak and residual false lumen sac 10.2 cm not candidate for surgery  ESRD  dialysis MWF  Dementia:  severe Acute Systolic CHF EF 40-45% in setting of renal failure and dialysis Volume management per renal   DNR/palliative appropriate  Charlton [facility] 11/14/2022, 7:18 AM

## 2022-11-14 NOTE — TOC Initial Note (Signed)
Transition of Care [facility]) - Initial/Assessment Note    Patient Details  Name: Ronald [name] MRN: [id number] Date of Birth: [birth year]  Transition of Care [facility]) CM/SW Contact:    Karn Cassis, LCSW Phone Number: 11/14/2022, 7:27 AM  Clinical Narrative:  Pt admitted for atrial fibrillation with RVR. Assessment completed due to high risk readmission score. Pt lives with his wife and is fairly independent with ADLs at baseline. His dialysis is MWF at [facility]. TOC will follow to assist with d/c planning needs.                       Expected Discharge Plan: Home/Self Care Barriers to Discharge: Continued Medical Work up   Patient Goals and CMS Choice Patient states their goals for this hospitalization and ongoing recovery are:: anticipate return home   Choice offered to / list presented to : Spouse [place] ownership interest in [facility].provided to::  (n/a)    Expected Discharge Plan and Services In-house Referral: Clinical Social Work     Living arrangements for the past 2 months: Single Family Home                                      Prior Living Arrangements/Services Living arrangements for the past 2 months: Single Family Home Lives with:: Spouse Patient language and need for interpreter reviewed:: Yes Do you feel safe going back to the place where you live?: Yes      Need for Family Participation in Patient Care: Yes (Comment)     Criminal Activity/Legal Involvement Pertinent to Current Situation/Hospitalization: No - Comment as needed  Activities of Daily Living   ADL Screening (condition at time of admission) Independently performs ADLs?: Yes (appropriate for developmental age) Is the patient deaf or have difficulty hearing?: No Does the patient have difficulty seeing, even when wearing glasses/contacts?: No Does the patient have difficulty concentrating, remembering, or making decisions?: Yes  Permission  Sought/Granted                  Emotional Assessment         Alcohol / Substance Use: Not Applicable Psych Involvement: No (comment)  Admission diagnosis:  NSTEMI (non-ST elevated myocardial infarction) (HCC) [I21.4] Atrial fibrillation with rapid ventricular response (HCC) [I48.91] Atrial fibrillation with RVR (HCC) [I48.91] Patient Active Problem List   Diagnosis Date Noted   Atrial fibrillation with RVR (HCC) 11/12/2022   NSTEMI (non-ST elevated myocardial infarction) (HCC) 11/12/2022   Hx of CABG 11/12/2022   Acute congestive heart failure (HCC) 11/12/2022   ESRD (end stage renal disease) (HCC) 11/12/2022   Tobacco abuse 11/12/2022   Mild cognitive impairment 07/05/2021   Gross hematuria 04/13/2020   Acute prostatitis 04/13/2020   PCP:  [name], MD Pharmacy:   [facility]) [place], [place] - [address] [facility] [address] [place] [address] [place] [address] Phone: [phone] Fax: [phone]  [id number] - [place], [place] - [address] AT [facility] [address] [place] [address] Phone: [phone] Fax: [phone]     Social Determinants of Health (SDOH) Social History: SDOH Screenings   Food Insecurity: Unknown (11/10/2017)   Received from [facility], [facility]  Financial Resource Strain: Low Risk  (11/10/2017)   Received from [facility], [facility]  Physical Activity: Unknown (  11/10/2017)   Received from [facility], [facility]  Social Connections: Unknown (11/10/2017)   Received from [facility], [facility]  Tobacco Use: Medium Risk (11/12/2022)  Health Literacy: Low Risk  (05/21/2020)   Received from [facility], [facility]   SDOH Interventions:     Readmission Risk Interventions    11/14/2022    7:26 AM  Readmission Risk Prevention Plan  Transportation Screening Complete  HRI or Home Care Consult Complete  Social Work Consult for  Recovery Care Planning/Counseling Complete  Palliative Care Screening Not Applicable  Medication Review Oceanographer) Complete

## 2022-11-14 NOTE — Progress Notes (Signed)
PHARMACY - ANTICOAGULATION CONSULT NOTE  Pharmacy Consult for Heparin Indication: chest pain/ACS  No Known Allergies  Patient Measurements: Height: 6\' 2"  (188 cm) Weight: 92.2 kg (203 lb 4.2 oz) IBW/kg (Calculated) : 82.2 HEPARIN DW (KG): 92.6   Vital Signs: Temp: 97.7 F (36.5 C) (10/04 0500) Temp Source: Axillary (10/04 0000) BP: 154/77 (10/04 0700) Pulse Rate: 83 (10/04 0700)  Labs: Recent Labs    11/12/22 1508 11/12/22 1713 11/12/22 2143 11/13/22 0015 11/13/22 0313 11/13/22 0945 11/13/22 1813 11/14/22 0522  HGB 10.8*  --   --   --  11.5*  --   --  11.0*  HCT 34.6*  --   --   --  35.9*  --   --  33.9*  PLT 175  --   --   --  200  --   --  183  HEPARINUNFRC  --   --   --    < >  --  0.33 0.40 0.27*  CREATININE 4.30*  --   --   --  5.19*  --   --   --   TROPONINIHS 1,125* 2,448* 9,187*  --   --   --   --   --    < > = values in this interval not displayed.    Estimated Creatinine Clearance: 13 mL/min (A) (by C-G formula based on SCr of 5.19 mg/dL (H)).   Medical History: Past Medical History:  Diagnosis Date   GERD (gastroesophageal reflux disease)    Glaucoma    Hypertension    Macular degeneration    Myocardial infarction [facility])    1999   Prostate cancer (HCC) 02/2018   Renal insufficiency    Stroke (HCC)    no deficits; had stroke while trying to place "stents in legs".    Medications:  No chronic anticoagulation PTA per med rec and chart review  Assessment: 82 yo male presented to ED with c/o chest pain post HD today. Patient is in atrial fib  RVR. Elevated troponin. Not on any oral anticoagulants. Pharmacy to dose and monitor heparin infusion.   Heparin level has been at goal, currently below goal (0.27) this morning on 1300 units/hr. CBC stable overnight, no bleeding or IV issues noted.    Goal of Therapy:  Heparin level 0.3-0.7 units/ml Monitor platelets by anticoagulation protocol: Yes   Plan:  Continue heparin infusion at 1400  units/hour. Check HL with AM labs Continue to monitor CBC daily while on heparin infusion.  Sheppard Coil PharmD., BCPS Clinical Pharmacist 11/14/2022 8:35 AM

## 2022-11-14 NOTE — Progress Notes (Signed)
134*  K 4.1 3.7 4.0 3.6  CL 96* 97* 95* 95*  CO2 22 29 24 23   GLUCOSE 107* 128* 135* 107*  BUN 45* 19 27* 39*  CREATININE 7.73* 4.30* 5.19* 7.00*  CALCIUM 9.2 8.3* 8.1* 8.6*  MG  --  2.0  --   --   PHOS  --   --   --  4.5   Liver Function Tests: Recent Labs  Lab 11/14/22 0904  ALBUMIN 3.6   No results for input(s): "LIPASE", "AMYLASE" in the last 168 hours. No  results for input(s): "AMMONIA" in the last 168 hours. Coagulation Profile: No results for input(s): "INR", "PROTIME" in the last 168 hours. CBC: Recent Labs  Lab 11/07/22 1418 11/12/22 1508 11/13/22 0313 11/14/22 0522  WBC 6.4 6.7 8.4 7.6  HGB 12.8* 10.8* 11.5* 11.0*  HCT 39.2 34.6* 35.9* 33.9*  MCV 96.6 97.2 97.0 96.9  PLT 190 175 200 183   Cardiac Enzymes: No results for input(s): "CKTOTAL", "CKMB", "CKMBINDEX", "TROPONINI" in the last 168 hours. BNP: Invalid input(s): "POCBNP" CBG: No results for input(s): "GLUCAP" in the last 168 hours. HbA1C: No results for input(s): "HGBA1C" in the last 72 hours. Urine analysis:    Component Value Date/Time   APPEARANCEUR Clear 10/29/2022 0906   GLUCOSEU Trace (A) 10/29/2022 0906   BILIRUBINUR Negative 10/29/2022 0906   PROTEINUR 3+ (A) 10/29/2022 0906   UROBILINOGEN 0.2 05/17/2019 0917   NITRITE Negative 10/29/2022 0906   LEUKOCYTESUR Negative 10/29/2022 0906   Sepsis Labs: @LABRCNTIP (procalcitonin:4,lacticidven:4) ) Recent Results (from the past 240 hour(s))  MRSA Next Gen by PCR, Nasal     Status: None   Collection Time: 11/12/22  8:33 PM   Specimen: Nasal Mucosa; Nasal Swab  Result Value Ref Range Status   MRSA by PCR Next Gen NOT DETECTED NOT DETECTED Final    Comment: (NOTE) The GeneXpert MRSA Assay (FDA approved for NASAL specimens only), is one component of a comprehensive MRSA colonization surveillance program. It is not intended to diagnose MRSA infection nor to guide or monitor treatment for MRSA infections. Test performance is not FDA approved in patients less than 89 years old. Performed at [facility], [address] [place], [place] [address]      Scheduled Meds:  aspirin EC  81 mg Oral Daily   atorvastatin  80 mg Oral q AM   calcium acetate  1,334 mg Oral TID   carvedilol  12.5 mg Oral BID WC   Chlorhexidine Gluconate Cloth  6 each Topical Daily   Chlorhexidine Gluconate Cloth  6 each Topical Q0600    memantine  5 mg Oral BID   tamsulosin  0.4 mg Oral QPC supper   zolpidem  5 mg Oral QHS   Continuous Infusions:  heparin 1,400 Units/hr (11/14/22 0941)    Procedures/Studies: ECHOCARDIOGRAM COMPLETE  Result Date: 11/13/2022    ECHOCARDIOGRAM REPORT   Patient Name:   Ronald [name] Date of Exam: 11/13/2022 Medical Rec #:  [id number]            Height:       74.0 in Accession #:    [id number]           Weight:       204.1 lb Date of Birth:  [birth year]            BSA:          2.193 m Patient Age:    50 years  134*  K 4.1 3.7 4.0 3.6  CL 96* 97* 95* 95*  CO2 22 29 24 23   GLUCOSE 107* 128* 135* 107*  BUN 45* 19 27* 39*  CREATININE 7.73* 4.30* 5.19* 7.00*  CALCIUM 9.2 8.3* 8.1* 8.6*  MG  --  2.0  --   --   PHOS  --   --   --  4.5   Liver Function Tests: Recent Labs  Lab 11/14/22 0904  ALBUMIN 3.6   No results for input(s): "LIPASE", "AMYLASE" in the last 168 hours. No  results for input(s): "AMMONIA" in the last 168 hours. Coagulation Profile: No results for input(s): "INR", "PROTIME" in the last 168 hours. CBC: Recent Labs  Lab 11/07/22 1418 11/12/22 1508 11/13/22 0313 11/14/22 0522  WBC 6.4 6.7 8.4 7.6  HGB 12.8* 10.8* 11.5* 11.0*  HCT 39.2 34.6* 35.9* 33.9*  MCV 96.6 97.2 97.0 96.9  PLT 190 175 200 183   Cardiac Enzymes: No results for input(s): "CKTOTAL", "CKMB", "CKMBINDEX", "TROPONINI" in the last 168 hours. BNP: Invalid input(s): "POCBNP" CBG: No results for input(s): "GLUCAP" in the last 168 hours. HbA1C: No results for input(s): "HGBA1C" in the last 72 hours. Urine analysis:    Component Value Date/Time   APPEARANCEUR Clear 10/29/2022 0906   GLUCOSEU Trace (A) 10/29/2022 0906   BILIRUBINUR Negative 10/29/2022 0906   PROTEINUR 3+ (A) 10/29/2022 0906   UROBILINOGEN 0.2 05/17/2019 0917   NITRITE Negative 10/29/2022 0906   LEUKOCYTESUR Negative 10/29/2022 0906   Sepsis Labs: @LABRCNTIP (procalcitonin:4,lacticidven:4) ) Recent Results (from the past 240 hour(s))  MRSA Next Gen by PCR, Nasal     Status: None   Collection Time: 11/12/22  8:33 PM   Specimen: Nasal Mucosa; Nasal Swab  Result Value Ref Range Status   MRSA by PCR Next Gen NOT DETECTED NOT DETECTED Final    Comment: (NOTE) The GeneXpert MRSA Assay (FDA approved for NASAL specimens only), is one component of a comprehensive MRSA colonization surveillance program. It is not intended to diagnose MRSA infection nor to guide or monitor treatment for MRSA infections. Test performance is not FDA approved in patients less than 89 years old. Performed at [facility], [address] [place], [place] [address]      Scheduled Meds:  aspirin EC  81 mg Oral Daily   atorvastatin  80 mg Oral q AM   calcium acetate  1,334 mg Oral TID   carvedilol  12.5 mg Oral BID WC   Chlorhexidine Gluconate Cloth  6 each Topical Daily   Chlorhexidine Gluconate Cloth  6 each Topical Q0600    memantine  5 mg Oral BID   tamsulosin  0.4 mg Oral QPC supper   zolpidem  5 mg Oral QHS   Continuous Infusions:  heparin 1,400 Units/hr (11/14/22 0941)    Procedures/Studies: ECHOCARDIOGRAM COMPLETE  Result Date: 11/13/2022    ECHOCARDIOGRAM REPORT   Patient Name:   Ronald [name] Date of Exam: 11/13/2022 Medical Rec #:  [id number]            Height:       74.0 in Accession #:    [id number]           Weight:       204.1 lb Date of Birth:  [birth year]            BSA:          2.193 m Patient Age:    50 years  PROGRESS NOTE  Ronald [name] PPI:[id number] DOB: 10/18/1940 DOA: 11/12/2022 PCP: Ronald Stabile, Ronald [name]  Brief History:  82 year old male with a history of coronary disease status post CABG, ESRD, hypertension, stroke, chronic impairment presenting with 4 to 5-day history of chest pain, orthopnea, and associated nausea and vomiting.  The patient went to dialysis on 11/12/22 and stayed the whole time.  The patient was confused at this time.  Spouse states that at baseline the patient has some cognitive impairment.  There is not been any fevers, chills, headache, neck pain abdominal pain.  There is no hematochezia or melena. In the ED, the patient was afebrile and hemodynamically stable.  Oxygen saturation 100% room air.  The patient was noted to have atrial fibrillation with RVR with heart rate in the 140s.  He was started on diltiazem drip. WBC 6.7, hemoglobin 10.8, platelets 200.  Sodium 132, potassium 4.0, bicarbonate 24, serum creatinine 5.19.  Cardiology and nephrology were consulted to assist with management.   Assessment/Plan:  NSTEMI -Troponins  1115>>2448>>9187 -Continue IV heparin -Cardiology consult appreciated -not a candidate for invasive therapy -10/3 Echo EF 40-45%, G2DD, mild MR, mod AS -spouse prefers non-invasive management   Atrial fibrillation with RVR -Initially started on IV diltiazem>>transitioned to po coreg -Patient has converted back to sinus -TSH -10/3 Echo EF 40-45%, G2DD, mild MR, mod AS -may need IV amio   ESRD -Patient dialyzes Monday, Wednesday, Friday -Last dialysis 11/12/22 -Nephrology consulted for maintenance dialysis -Continue phosphate binders -on HD 10/4   Essential hypertension -Patient on carvedilol at home--6.25 mg twice daily -Defer changes to cardiology at this time as there may be some changes due to the patient's atrial fibrillation   Major neurocognitive disorder -Spouse endorses gradual chronic decline over the last 6  months -Continue Namenda   Mixed hyperlipidemia -Continue statin   Tobacco abuse -Tobacco cessation discussed   Hospital delirium -daughter does not want haldol -continue valium prn         Family Communication:   spouse updated 10/4   Consultants:  cardiology, nephrology   Code Status:  DNR--confirmed with spouse on 10/3   DVT Prophylaxis:  IV Heparin      Procedures: As Listed in Progress Note Above   Antibiotics: None          Subjective: Patient denies fevers, chills, headache, chest pain, dyspnea, nausea, vomiting, diarrhea, abdominal pain, dysuria, hematuria,   Objective: Vitals:   11/14/22 1045 11/14/22 1050 11/14/22 1100 11/14/22 1115  BP: (!) 183/132 (!) 189/115 (!) 200/111   Pulse: (!) 49  (!) 119 (!) 115  Resp: 20  (!) 22 (!) 22  Temp:      TempSrc:      SpO2: 100%  100% 100%  Weight:      Height:       No intake or output data in the 24 hours ending 11/14/22 1137 Weight change: -2.1 kg Exam:  General:  Pt is alert, follows commands appropriately, not in acute distress HEENT: No icterus, No thrush, No neck mass, [place]/AT Cardiovascular: RRR, S1/S2, no rubs, no gallops Respiratory: CTA bilaterally, no wheezing, no crackles, no rhonchi Abdomen: Soft/+BS, non tender, non distended, no guarding Extremities: No edema, No lymphangitis, No petechiae, No rashes, no synovitis   Data Reviewed: I have personally reviewed following labs and imaging studies Basic Metabolic Panel: Recent Labs  Lab 11/07/22 1418 11/12/22 1508 11/13/22 0313 11/14/22 0904  NA 135 135 132*  134*  K 4.1 3.7 4.0 3.6  CL 96* 97* 95* 95*  CO2 22 29 24 23   GLUCOSE 107* 128* 135* 107*  BUN 45* 19 27* 39*  CREATININE 7.73* 4.30* 5.19* 7.00*  CALCIUM 9.2 8.3* 8.1* 8.6*  MG  --  2.0  --   --   PHOS  --   --   --  4.5   Liver Function Tests: Recent Labs  Lab 11/14/22 0904  ALBUMIN 3.6   No results for input(s): "LIPASE", "AMYLASE" in the last 168 hours. No  results for input(s): "AMMONIA" in the last 168 hours. Coagulation Profile: No results for input(s): "INR", "PROTIME" in the last 168 hours. CBC: Recent Labs  Lab 11/07/22 1418 11/12/22 1508 11/13/22 0313 11/14/22 0522  WBC 6.4 6.7 8.4 7.6  HGB 12.8* 10.8* 11.5* 11.0*  HCT 39.2 34.6* 35.9* 33.9*  MCV 96.6 97.2 97.0 96.9  PLT 190 175 200 183   Cardiac Enzymes: No results for input(s): "CKTOTAL", "CKMB", "CKMBINDEX", "TROPONINI" in the last 168 hours. BNP: Invalid input(s): "POCBNP" CBG: No results for input(s): "GLUCAP" in the last 168 hours. HbA1C: No results for input(s): "HGBA1C" in the last 72 hours. Urine analysis:    Component Value Date/Time   APPEARANCEUR Clear 10/29/2022 0906   GLUCOSEU Trace (A) 10/29/2022 0906   BILIRUBINUR Negative 10/29/2022 0906   PROTEINUR 3+ (A) 10/29/2022 0906   UROBILINOGEN 0.2 05/17/2019 0917   NITRITE Negative 10/29/2022 0906   LEUKOCYTESUR Negative 10/29/2022 0906   Sepsis Labs: @LABRCNTIP (procalcitonin:4,lacticidven:4) ) Recent Results (from the past 240 hour(s))  MRSA Next Gen by PCR, Nasal     Status: None   Collection Time: 11/12/22  8:33 PM   Specimen: Nasal Mucosa; Nasal Swab  Result Value Ref Range Status   MRSA by PCR Next Gen NOT DETECTED NOT DETECTED Final    Comment: (NOTE) The GeneXpert MRSA Assay (FDA approved for NASAL specimens only), is one component of a comprehensive MRSA colonization surveillance program. It is not intended to diagnose MRSA infection nor to guide or monitor treatment for MRSA infections. Test performance is not FDA approved in patients less than 89 years old. Performed at [facility], [address] [place], [place] [address]      Scheduled Meds:  aspirin EC  81 mg Oral Daily   atorvastatin  80 mg Oral q AM   calcium acetate  1,334 mg Oral TID   carvedilol  12.5 mg Oral BID WC   Chlorhexidine Gluconate Cloth  6 each Topical Daily   Chlorhexidine Gluconate Cloth  6 each Topical Q0600    memantine  5 mg Oral BID   tamsulosin  0.4 mg Oral QPC supper   zolpidem  5 mg Oral QHS   Continuous Infusions:  heparin 1,400 Units/hr (11/14/22 0941)    Procedures/Studies: ECHOCARDIOGRAM COMPLETE  Result Date: 11/13/2022    ECHOCARDIOGRAM REPORT   Patient Name:   Ronald [name] Date of Exam: 11/13/2022 Medical Rec #:  [id number]            Height:       74.0 in Accession #:    [id number]           Weight:       204.1 lb Date of Birth:  [birth year]            BSA:          2.193 m Patient Age:    50 years  PROGRESS NOTE  Ronald [name] PPI:[id number] DOB: 10/18/1940 DOA: 11/12/2022 PCP: Ronald Stabile, Ronald [name]  Brief History:  82 year old male with a history of coronary disease status post CABG, ESRD, hypertension, stroke, chronic impairment presenting with 4 to 5-day history of chest pain, orthopnea, and associated nausea and vomiting.  The patient went to dialysis on 11/12/22 and stayed the whole time.  The patient was confused at this time.  Spouse states that at baseline the patient has some cognitive impairment.  There is not been any fevers, chills, headache, neck pain abdominal pain.  There is no hematochezia or melena. In the ED, the patient was afebrile and hemodynamically stable.  Oxygen saturation 100% room air.  The patient was noted to have atrial fibrillation with RVR with heart rate in the 140s.  He was started on diltiazem drip. WBC 6.7, hemoglobin 10.8, platelets 200.  Sodium 132, potassium 4.0, bicarbonate 24, serum creatinine 5.19.  Cardiology and nephrology were consulted to assist with management.   Assessment/Plan:  NSTEMI -Troponins  1115>>2448>>9187 -Continue IV heparin -Cardiology consult appreciated -not a candidate for invasive therapy -10/3 Echo EF 40-45%, G2DD, mild MR, mod AS -spouse prefers non-invasive management   Atrial fibrillation with RVR -Initially started on IV diltiazem>>transitioned to po coreg -Patient has converted back to sinus -TSH -10/3 Echo EF 40-45%, G2DD, mild MR, mod AS -may need IV amio   ESRD -Patient dialyzes Monday, Wednesday, Friday -Last dialysis 11/12/22 -Nephrology consulted for maintenance dialysis -Continue phosphate binders -on HD 10/4   Essential hypertension -Patient on carvedilol at home--6.25 mg twice daily -Defer changes to cardiology at this time as there may be some changes due to the patient's atrial fibrillation   Major neurocognitive disorder -Spouse endorses gradual chronic decline over the last 6  months -Continue Namenda   Mixed hyperlipidemia -Continue statin   Tobacco abuse -Tobacco cessation discussed   Hospital delirium -daughter does not want haldol -continue valium prn         Family Communication:   spouse updated 10/4   Consultants:  cardiology, nephrology   Code Status:  DNR--confirmed with spouse on 10/3   DVT Prophylaxis:  IV Heparin      Procedures: As Listed in Progress Note Above   Antibiotics: None          Subjective: Patient denies fevers, chills, headache, chest pain, dyspnea, nausea, vomiting, diarrhea, abdominal pain, dysuria, hematuria,   Objective: Vitals:   11/14/22 1045 11/14/22 1050 11/14/22 1100 11/14/22 1115  BP: (!) 183/132 (!) 189/115 (!) 200/111   Pulse: (!) 49  (!) 119 (!) 115  Resp: 20  (!) 22 (!) 22  Temp:      TempSrc:      SpO2: 100%  100% 100%  Weight:      Height:       No intake or output data in the 24 hours ending 11/14/22 1137 Weight change: -2.1 kg Exam:  General:  Pt is alert, follows commands appropriately, not in acute distress HEENT: No icterus, No thrush, No neck mass, [place]/AT Cardiovascular: RRR, S1/S2, no rubs, no gallops Respiratory: CTA bilaterally, no wheezing, no crackles, no rhonchi Abdomen: Soft/+BS, non tender, non distended, no guarding Extremities: No edema, No lymphangitis, No petechiae, No rashes, no synovitis   Data Reviewed: I have personally reviewed following labs and imaging studies Basic Metabolic Panel: Recent Labs  Lab 11/07/22 1418 11/12/22 1508 11/13/22 0313 11/14/22 0904  NA 135 135 132*

## 2022-11-14 NOTE — Plan of Care (Signed)

## 2022-11-14 NOTE — Progress Notes (Signed)
   11/14/22 1320  Vitals  Temp 97.6 F (36.4 C)  Temp Source Axillary  BP (!) 156/77  BP Location Right Arm  BP Method Automatic  Oxygen Therapy  O2 Device Nasal Cannula  O2 Flow Rate (L/min) 2 L/min  During Treatment Monitoring  Intra-Hemodialysis Comments Tx completed  Post Treatment  Dialyzer Clearance Lightly streaked  Hemodialysis Intake (mL) 0 mL  Liters Processed 78  Fluid Removed (mL) 3000 mL  Tolerated HD Treatment Yes  Post-Hemodialysis Comments Pt goal met.  AVG/AVF Arterial Site Held (minutes) 10 minutes  AVG/AVF Venous Site Held (minutes) 10 minutes  Fistula / Graft Left Forearm Arteriovenous fistula  Placement Date/Time: 10/16/20 4696   Orientation: Left  Access Location: Forearm  Access Type: Arteriovenous fistula  Site Condition No complications  Fistula / Graft Assessment Present;Thrill;Bruit  Status Deaccessed  Needle Size 15  Drainage Description None

## 2022-11-14 NOTE — Care Management Important Message (Signed)
Important Message  Patient Details  Name: Ronald [name] MRN: [id number] Date of Birth: 03-30-1940   Important Message Given:  N/A - LOS <3 / Initial given by admissions     Corey Harold 11/14/2022, 3:56 PM

## 2022-11-15 DIAGNOSIS — I4891 Unspecified atrial fibrillation: Secondary | ICD-10-CM | POA: Diagnosis not present

## 2022-11-15 DIAGNOSIS — I214 Non-ST elevation (NSTEMI) myocardial infarction: Secondary | ICD-10-CM | POA: Diagnosis not present

## 2022-11-15 DIAGNOSIS — N186 End stage renal disease: Secondary | ICD-10-CM | POA: Diagnosis not present

## 2022-11-15 LAB — RENAL FUNCTION PANEL
Albumin: 3.2 g/dL — ABNORMAL LOW (ref 3.5–5.0)
Anion gap: 13 (ref 5–15)
BUN: 31 mg/dL — ABNORMAL HIGH (ref 8–23)
CO2: 26 mmol/L (ref 22–32)
Calcium: 8.5 mg/dL — ABNORMAL LOW (ref 8.9–10.3)
Chloride: 95 mmol/L — ABNORMAL LOW (ref 98–111)
Creatinine, Ser: 5.6 mg/dL — ABNORMAL HIGH (ref 0.61–1.24)
GFR, Estimated: 10 mL/min — ABNORMAL LOW (ref >60)
Glucose, Bld: 90 mg/dL (ref 70–99)
Phosphorus: 3.6 mg/dL (ref 2.5–4.6)
Potassium: 3.5 mmol/L (ref 3.5–5.1)
Sodium: 134 mmol/L — ABNORMAL LOW (ref 135–145)

## 2022-11-15 LAB — CBC
HCT: 31.5 % — ABNORMAL LOW (ref 39.0–52.0)
Hemoglobin: 10.2 g/dL — ABNORMAL LOW (ref 13.0–17.0)
MCH: 31 pg (ref 26.0–34.0)
MCHC: 32.4 g/dL (ref 30.0–36.0)
MCV: 95.7 fL (ref 80.0–100.0)
Platelets: 170 10*3/uL (ref 150–400)
RBC: 3.29 MIL/uL — ABNORMAL LOW (ref 4.22–5.81)
RDW: 15.1 % (ref 11.5–15.5)
WBC: 6.5 10*3/uL (ref 4.0–10.5)
nRBC: 0 % (ref 0.0–0.2)

## 2022-11-15 LAB — HEPARIN LEVEL (UNFRACTIONATED): Heparin Unfractionated: 0.3 [IU]/mL (ref 0.30–0.70)

## 2022-11-15 MED ORDER — THIAMINE MONONITRATE 100 MG PO TABS
100.0000 mg | ORAL_TABLET | Freq: Every day | ORAL | Status: DC
  Administered 2022-11-15 – 2022-11-17 (×3): 100 mg via ORAL
  Filled 2022-11-15 (×3): qty 1

## 2022-11-15 MED ORDER — APIXABAN 2.5 MG PO TABS
2.5000 mg | ORAL_TABLET | Freq: Two times a day (BID) | ORAL | Status: DC
  Administered 2022-11-15 – 2022-11-17 (×5): 2.5 mg via ORAL
  Filled 2022-11-15 (×5): qty 1

## 2022-11-15 MED ORDER — CARVEDILOL 12.5 MG PO TABS
25.0000 mg | ORAL_TABLET | Freq: Two times a day (BID) | ORAL | Status: DC
  Administered 2022-11-15 – 2022-11-17 (×4): 25 mg via ORAL
  Filled 2022-11-15 (×4): qty 2

## 2022-11-15 NOTE — Progress Notes (Addendum)
no synovitis   Data Reviewed: I have personally reviewed following labs and imaging studies Basic Metabolic Panel: Recent Labs  Lab 11/12/22 1508 11/13/22 0313 11/14/22 0904 11/15/22 0533  NA 135 132* 134* 134*  K 3.7 4.0 3.6 3.5  CL 97* 95* 95* 95*  CO2 29 24 23 26   GLUCOSE 128* 135* 107* 90  BUN 19 27* 39* 31*  CREATININE 4.30* 5.19* 7.00* 5.60*  CALCIUM 8.3* 8.1* 8.6*  8.5*  MG 2.0  --   --   --   PHOS  --   --  4.5 3.6   Liver Function Tests: Recent Labs  Lab 11/14/22 0904 11/15/22 0533  ALBUMIN 3.6 3.2*   No results for input(s): "LIPASE", "AMYLASE" in the last 168 hours. No results for input(s): "AMMONIA" in the last 168 hours. Coagulation Profile: No results for input(s): "INR", "PROTIME" in the last 168 hours. CBC: Recent Labs  Lab 11/12/22 1508 11/13/22 0313 11/14/22 0522 11/15/22 0533  WBC 6.7 8.4 7.6 6.5  HGB 10.8* 11.5* 11.0* 10.2*  HCT 34.6* 35.9* 33.9* 31.5*  MCV 97.2 97.0 96.9 95.7  PLT 175 200 183 170   Cardiac Enzymes: No results for input(s): "CKTOTAL", "CKMB", "CKMBINDEX", "TROPONINI" in the last 168 hours. BNP: Invalid input(s): "POCBNP" CBG: No results for input(s): "GLUCAP" in the last 168 hours. HbA1C: No results for input(s): "HGBA1C" in the last 72 hours. Urine analysis:    Component Value Date/Time   APPEARANCEUR Clear 10/29/2022 0906   GLUCOSEU Trace (A) 10/29/2022 0906   BILIRUBINUR Negative 10/29/2022 0906   PROTEINUR 3+ (A) 10/29/2022 0906   UROBILINOGEN 0.2 05/17/2019 0917   NITRITE Negative 10/29/2022 0906   LEUKOCYTESUR Negative 10/29/2022 0906   Sepsis Labs: @LABRCNTIP (procalcitonin:4,lacticidven:4) ) Recent Results (from the past 240 hour(s))  MRSA Next Gen by PCR, Nasal     Status: None   Collection Time: 11/12/22  8:33 PM   Specimen: Nasal Mucosa; Nasal Swab  Result Value Ref Range Status   MRSA by PCR Next Gen NOT DETECTED NOT DETECTED Final    Comment: (NOTE) The GeneXpert MRSA Assay (FDA approved for NASAL specimens only), is one component of a comprehensive MRSA colonization surveillance program. It is not intended to diagnose MRSA infection nor to guide or monitor treatment for MRSA infections. Test performance is not FDA approved in patients less than 35 years old. Performed at [facility], [address], [place], [place] [address]      Scheduled Meds:  aspirin EC  81 mg  Oral Daily   atorvastatin  80 mg Oral q AM   calcium acetate  1,334 mg Oral TID   carvedilol  12.5 mg Oral BID WC   Chlorhexidine Gluconate Cloth  6 each Topical Daily   Chlorhexidine Gluconate Cloth  6 each Topical Q0600   memantine  5 mg Oral BID   tamsulosin  0.4 mg Oral QPC supper   zolpidem  5 mg Oral QHS   Continuous Infusions:  Procedures/Studies: ECHOCARDIOGRAM COMPLETE  Result Date: 11/13/2022    ECHOCARDIOGRAM REPORT   Patient Name:   Ronald [name] Date of Exam: 11/13/2022 Medical Rec #:  [id number]            Height:       74.0 in Accession #:    [id number]           Weight:       204.1 lb Date of Birth:  Oct 07, 1940  PROGRESS NOTE  Ronald [name] YQM:[id number] DOB: 1940-07-13 DOA: 11/12/2022 PCP: [name], MD  Brief History:  82 year old male with a history of coronary disease status post CABG, ESRD, hypertension, stroke, chronic impairment presenting with 4 to 5-day history of chest pain, orthopnea, and associated nausea and vomiting.  The patient went to dialysis on 11/12/22 and stayed the whole time.  The patient was confused at this time.  Spouse states that at baseline the patient has some cognitive impairment.  There is not been any fevers, chills, headache, neck pain abdominal pain.  There is no hematochezia or melena. In the ED, the patient was afebrile and hemodynamically stable.  Oxygen saturation 100% room air.  The patient was noted to have atrial fibrillation with RVR with heart rate in the 140s.  He was started on diltiazem drip. WBC 6.7, hemoglobin 10.8, platelets 200.  Sodium 132, potassium 4.0, bicarbonate 24, serum creatinine 5.19.  Cardiology and nephrology were consulted to assist with management.   Assessment/Plan: NSTEMI -Troponins  1115>>2448>>9187 -Continue IV heparins>>stopp on 10/5 -Cardiology consult appreciated -not a candidate for invasive therapy -10/3 Echo EF 40-45%, G2DD, mild MR, mod AS -spouse prefers non-invasive management   Atrial fibrillation with RVR -Initially started on IV diltiazem>>transitioned to po coreg -personally reviewed telemetry -Patient has converted back to sinus, but continues to have frequent nonsustained episodes SVT -TSH--3.530 -10/3 Echo EF 40-45%, G2DD, mild MR, mod AS -increase coreg to 25 mg bid -transition to apixaban -may need IV amio   ESRD -Patient dialyzes Monday, Wednesday, Friday -Last dialysis 11/12/22 -Nephrology consulted for maintenance dialysis -Continue phosphate binders -last HD 10/4   Essential hypertension -Patient on carvedilol at home--6.25 mg twice daily at home -increase carvedilol to 25 mg  bid as discussed above   Major neurocognitive disorder -Spouse endorses gradual chronic decline over the last 6 months -Continue Namenda   Mixed hyperlipidemia -Continue statin   Tobacco abuse -Tobacco cessation discussed   Hospital delirium -daughter does not want haldol -continue valium prn         Family Communication:   spouse updated 10/5   Consultants:  cardiology, nephrology   Code Status:  DNR--confirmed with spouse on 10/3   DVT Prophylaxis:  IV Heparin      Procedures: As Listed in Progress Note Above   Antibiotics: None            Subjective: Patient denies fevers, chills, headache, chest pain, dyspnea, nausea, vomiting, diarrhea, abdominal pain, dysuria, hematuria,    Objective: Vitals:   11/15/22 0600 11/15/22 0649 11/15/22 0804 11/15/22 0808  BP: (!) 125/53   (!) 196/78  Pulse: 97 72  95  Resp: 16 19    Temp:   98 F (36.7 C)   TempSrc:   Oral   SpO2: (!) 87% 96%    Weight:      Height:        Intake/Output Summary (Last 24 hours) at 11/15/2022 1059 Last data filed at 11/14/2022 1950 Gross per 24 hour  Intake 595.79 ml  Output 3400 ml  Net -2804.21 ml   Weight change: -0.2 kg Exam:  General:  Pt is alert, follows commands appropriately, not in acute distress HEENT: No icterus, No thrush, No neck mass, [place]/AT Cardiovascular: RRR, S1/S2, no rubs, no gallops Respiratory: CTA bilaterally, no wheezing, no crackles, no rhonchi Abdomen: Soft/+BS, non tender, non distended, no guarding Extremities: No edema, No lymphangitis, No petechiae, No rashes,  no synovitis   Data Reviewed: I have personally reviewed following labs and imaging studies Basic Metabolic Panel: Recent Labs  Lab 11/12/22 1508 11/13/22 0313 11/14/22 0904 11/15/22 0533  NA 135 132* 134* 134*  K 3.7 4.0 3.6 3.5  CL 97* 95* 95* 95*  CO2 29 24 23 26   GLUCOSE 128* 135* 107* 90  BUN 19 27* 39* 31*  CREATININE 4.30* 5.19* 7.00* 5.60*  CALCIUM 8.3* 8.1* 8.6*  8.5*  MG 2.0  --   --   --   PHOS  --   --  4.5 3.6   Liver Function Tests: Recent Labs  Lab 11/14/22 0904 11/15/22 0533  ALBUMIN 3.6 3.2*   No results for input(s): "LIPASE", "AMYLASE" in the last 168 hours. No results for input(s): "AMMONIA" in the last 168 hours. Coagulation Profile: No results for input(s): "INR", "PROTIME" in the last 168 hours. CBC: Recent Labs  Lab 11/12/22 1508 11/13/22 0313 11/14/22 0522 11/15/22 0533  WBC 6.7 8.4 7.6 6.5  HGB 10.8* 11.5* 11.0* 10.2*  HCT 34.6* 35.9* 33.9* 31.5*  MCV 97.2 97.0 96.9 95.7  PLT 175 200 183 170   Cardiac Enzymes: No results for input(s): "CKTOTAL", "CKMB", "CKMBINDEX", "TROPONINI" in the last 168 hours. BNP: Invalid input(s): "POCBNP" CBG: No results for input(s): "GLUCAP" in the last 168 hours. HbA1C: No results for input(s): "HGBA1C" in the last 72 hours. Urine analysis:    Component Value Date/Time   APPEARANCEUR Clear 10/29/2022 0906   GLUCOSEU Trace (A) 10/29/2022 0906   BILIRUBINUR Negative 10/29/2022 0906   PROTEINUR 3+ (A) 10/29/2022 0906   UROBILINOGEN 0.2 05/17/2019 0917   NITRITE Negative 10/29/2022 0906   LEUKOCYTESUR Negative 10/29/2022 0906   Sepsis Labs: @LABRCNTIP (procalcitonin:4,lacticidven:4) ) Recent Results (from the past 240 hour(s))  MRSA Next Gen by PCR, Nasal     Status: None   Collection Time: 11/12/22  8:33 PM   Specimen: Nasal Mucosa; Nasal Swab  Result Value Ref Range Status   MRSA by PCR Next Gen NOT DETECTED NOT DETECTED Final    Comment: (NOTE) The GeneXpert MRSA Assay (FDA approved for NASAL specimens only), is one component of a comprehensive MRSA colonization surveillance program. It is not intended to diagnose MRSA infection nor to guide or monitor treatment for MRSA infections. Test performance is not FDA approved in patients less than 35 years old. Performed at [facility], [address], [place], [place] [address]      Scheduled Meds:  aspirin EC  81 mg  Oral Daily   atorvastatin  80 mg Oral q AM   calcium acetate  1,334 mg Oral TID   carvedilol  12.5 mg Oral BID WC   Chlorhexidine Gluconate Cloth  6 each Topical Daily   Chlorhexidine Gluconate Cloth  6 each Topical Q0600   memantine  5 mg Oral BID   tamsulosin  0.4 mg Oral QPC supper   zolpidem  5 mg Oral QHS   Continuous Infusions:  Procedures/Studies: ECHOCARDIOGRAM COMPLETE  Result Date: 11/13/2022    ECHOCARDIOGRAM REPORT   Patient Name:   Ronald [name] Date of Exam: 11/13/2022 Medical Rec #:  [id number]            Height:       74.0 in Accession #:    [id number]           Weight:       204.1 lb Date of Birth:  Oct 07, 1940  no synovitis   Data Reviewed: I have personally reviewed following labs and imaging studies Basic Metabolic Panel: Recent Labs  Lab 11/12/22 1508 11/13/22 0313 11/14/22 0904 11/15/22 0533  NA 135 132* 134* 134*  K 3.7 4.0 3.6 3.5  CL 97* 95* 95* 95*  CO2 29 24 23 26   GLUCOSE 128* 135* 107* 90  BUN 19 27* 39* 31*  CREATININE 4.30* 5.19* 7.00* 5.60*  CALCIUM 8.3* 8.1* 8.6*  8.5*  MG 2.0  --   --   --   PHOS  --   --  4.5 3.6   Liver Function Tests: Recent Labs  Lab 11/14/22 0904 11/15/22 0533  ALBUMIN 3.6 3.2*   No results for input(s): "LIPASE", "AMYLASE" in the last 168 hours. No results for input(s): "AMMONIA" in the last 168 hours. Coagulation Profile: No results for input(s): "INR", "PROTIME" in the last 168 hours. CBC: Recent Labs  Lab 11/12/22 1508 11/13/22 0313 11/14/22 0522 11/15/22 0533  WBC 6.7 8.4 7.6 6.5  HGB 10.8* 11.5* 11.0* 10.2*  HCT 34.6* 35.9* 33.9* 31.5*  MCV 97.2 97.0 96.9 95.7  PLT 175 200 183 170   Cardiac Enzymes: No results for input(s): "CKTOTAL", "CKMB", "CKMBINDEX", "TROPONINI" in the last 168 hours. BNP: Invalid input(s): "POCBNP" CBG: No results for input(s): "GLUCAP" in the last 168 hours. HbA1C: No results for input(s): "HGBA1C" in the last 72 hours. Urine analysis:    Component Value Date/Time   APPEARANCEUR Clear 10/29/2022 0906   GLUCOSEU Trace (A) 10/29/2022 0906   BILIRUBINUR Negative 10/29/2022 0906   PROTEINUR 3+ (A) 10/29/2022 0906   UROBILINOGEN 0.2 05/17/2019 0917   NITRITE Negative 10/29/2022 0906   LEUKOCYTESUR Negative 10/29/2022 0906   Sepsis Labs: @LABRCNTIP (procalcitonin:4,lacticidven:4) ) Recent Results (from the past 240 hour(s))  MRSA Next Gen by PCR, Nasal     Status: None   Collection Time: 11/12/22  8:33 PM   Specimen: Nasal Mucosa; Nasal Swab  Result Value Ref Range Status   MRSA by PCR Next Gen NOT DETECTED NOT DETECTED Final    Comment: (NOTE) The GeneXpert MRSA Assay (FDA approved for NASAL specimens only), is one component of a comprehensive MRSA colonization surveillance program. It is not intended to diagnose MRSA infection nor to guide or monitor treatment for MRSA infections. Test performance is not FDA approved in patients less than 35 years old. Performed at [facility], [address], [place], [place] [address]      Scheduled Meds:  aspirin EC  81 mg  Oral Daily   atorvastatin  80 mg Oral q AM   calcium acetate  1,334 mg Oral TID   carvedilol  12.5 mg Oral BID WC   Chlorhexidine Gluconate Cloth  6 each Topical Daily   Chlorhexidine Gluconate Cloth  6 each Topical Q0600   memantine  5 mg Oral BID   tamsulosin  0.4 mg Oral QPC supper   zolpidem  5 mg Oral QHS   Continuous Infusions:  Procedures/Studies: ECHOCARDIOGRAM COMPLETE  Result Date: 11/13/2022    ECHOCARDIOGRAM REPORT   Patient Name:   Ronald [name] Date of Exam: 11/13/2022 Medical Rec #:  [id number]            Height:       74.0 in Accession #:    [id number]           Weight:       204.1 lb Date of Birth:  Oct 07, 1940  no synovitis   Data Reviewed: I have personally reviewed following labs and imaging studies Basic Metabolic Panel: Recent Labs  Lab 11/12/22 1508 11/13/22 0313 11/14/22 0904 11/15/22 0533  NA 135 132* 134* 134*  K 3.7 4.0 3.6 3.5  CL 97* 95* 95* 95*  CO2 29 24 23 26   GLUCOSE 128* 135* 107* 90  BUN 19 27* 39* 31*  CREATININE 4.30* 5.19* 7.00* 5.60*  CALCIUM 8.3* 8.1* 8.6*  8.5*  MG 2.0  --   --   --   PHOS  --   --  4.5 3.6   Liver Function Tests: Recent Labs  Lab 11/14/22 0904 11/15/22 0533  ALBUMIN 3.6 3.2*   No results for input(s): "LIPASE", "AMYLASE" in the last 168 hours. No results for input(s): "AMMONIA" in the last 168 hours. Coagulation Profile: No results for input(s): "INR", "PROTIME" in the last 168 hours. CBC: Recent Labs  Lab 11/12/22 1508 11/13/22 0313 11/14/22 0522 11/15/22 0533  WBC 6.7 8.4 7.6 6.5  HGB 10.8* 11.5* 11.0* 10.2*  HCT 34.6* 35.9* 33.9* 31.5*  MCV 97.2 97.0 96.9 95.7  PLT 175 200 183 170   Cardiac Enzymes: No results for input(s): "CKTOTAL", "CKMB", "CKMBINDEX", "TROPONINI" in the last 168 hours. BNP: Invalid input(s): "POCBNP" CBG: No results for input(s): "GLUCAP" in the last 168 hours. HbA1C: No results for input(s): "HGBA1C" in the last 72 hours. Urine analysis:    Component Value Date/Time   APPEARANCEUR Clear 10/29/2022 0906   GLUCOSEU Trace (A) 10/29/2022 0906   BILIRUBINUR Negative 10/29/2022 0906   PROTEINUR 3+ (A) 10/29/2022 0906   UROBILINOGEN 0.2 05/17/2019 0917   NITRITE Negative 10/29/2022 0906   LEUKOCYTESUR Negative 10/29/2022 0906   Sepsis Labs: @LABRCNTIP (procalcitonin:4,lacticidven:4) ) Recent Results (from the past 240 hour(s))  MRSA Next Gen by PCR, Nasal     Status: None   Collection Time: 11/12/22  8:33 PM   Specimen: Nasal Mucosa; Nasal Swab  Result Value Ref Range Status   MRSA by PCR Next Gen NOT DETECTED NOT DETECTED Final    Comment: (NOTE) The GeneXpert MRSA Assay (FDA approved for NASAL specimens only), is one component of a comprehensive MRSA colonization surveillance program. It is not intended to diagnose MRSA infection nor to guide or monitor treatment for MRSA infections. Test performance is not FDA approved in patients less than 35 years old. Performed at [facility], [address], [place], [place] [address]      Scheduled Meds:  aspirin EC  81 mg  Oral Daily   atorvastatin  80 mg Oral q AM   calcium acetate  1,334 mg Oral TID   carvedilol  12.5 mg Oral BID WC   Chlorhexidine Gluconate Cloth  6 each Topical Daily   Chlorhexidine Gluconate Cloth  6 each Topical Q0600   memantine  5 mg Oral BID   tamsulosin  0.4 mg Oral QPC supper   zolpidem  5 mg Oral QHS   Continuous Infusions:  Procedures/Studies: ECHOCARDIOGRAM COMPLETE  Result Date: 11/13/2022    ECHOCARDIOGRAM REPORT   Patient Name:   Ronald [name] Date of Exam: 11/13/2022 Medical Rec #:  [id number]            Height:       74.0 in Accession #:    [id number]           Weight:       204.1 lb Date of Birth:  Oct 07, 1940

## 2022-11-15 NOTE — Progress Notes (Signed)
PHARMACY - ANTICOAGULATION CONSULT NOTE  Pharmacy Consult for Heparin Indication: chest pain/ACS  No Known Allergies  Patient Measurements: Height: 6\' 2"  (188 cm) Weight: 89.1 kg (196 lb 6.9 oz) IBW/kg (Calculated) : 82.2 HEPARIN DW (KG): 92.6   Vital Signs: Temp: 97.7 F (36.5 C) (10/05 0500) Temp Source: Axillary (10/05 0500) BP: 125/53 (10/05 0600) Pulse Rate: 72 (10/05 0649)  Labs: Recent Labs    11/12/22 1508 11/12/22 1713 11/12/22 2143 11/13/22 0015 11/13/22 0313 11/13/22 0945 11/13/22 1813 11/14/22 0522 11/14/22 0904 11/15/22 0533  HGB 10.8*  --   --   --  11.5*  --   --  11.0*  --  10.2*  HCT 34.6*  --   --   --  35.9*  --   --  33.9*  --  31.5*  PLT 175  --   --   --  200  --   --  183  --  170  HEPARINUNFRC  --   --   --    < >  --    < > 0.40 0.27*  --  0.30  CREATININE 4.30*  --   --   --  5.19*  --   --   --  7.00* 5.60*  TROPONINIHS 1,125* 2,448* 9,187*  --   --   --   --   --   --   --    < > = values in this interval not displayed.    Estimated Creatinine Clearance: 12 mL/min (A) (by C-G formula based on SCr of 5.6 mg/dL (H)).   Medical History: Past Medical History:  Diagnosis Date   GERD (gastroesophageal reflux disease)    Glaucoma    Hypertension    Macular degeneration    Myocardial infarction [facility])    1999   Prostate cancer (HCC) 02/2018   Renal insufficiency    Stroke (HCC)    no deficits; had stroke while trying to place "stents in legs".    Medications:  No chronic anticoagulation PTA per med rec and chart review  Assessment: 82 yo male presented to ED with c/o chest pain post HD today. Patient is in atrial fib  RVR. Elevated troponin. Not on any oral anticoagulants. Pharmacy to dose and monitor heparin infusion.   Heparin level at goal this morning on 1400 units/hr. CBC stable overnight, no bleeding or IV issues noted.    Goal of Therapy:  Heparin level 0.3-0.7 units/ml Monitor platelets by anticoagulation protocol:  Yes   Plan:  Continue heparin infusion at 1400 units/hour. Follow up length of therapy Continue to monitor CBC daily while on heparin infusion.  Sheppard Coil PharmD., BCPS Clinical Pharmacist 11/15/2022 7:57 AM

## 2022-11-16 DIAGNOSIS — Z720 Tobacco use: Secondary | ICD-10-CM | POA: Diagnosis not present

## 2022-11-16 DIAGNOSIS — N186 End stage renal disease: Secondary | ICD-10-CM | POA: Diagnosis not present

## 2022-11-16 DIAGNOSIS — I214 Non-ST elevation (NSTEMI) myocardial infarction: Secondary | ICD-10-CM | POA: Diagnosis not present

## 2022-11-16 DIAGNOSIS — I4891 Unspecified atrial fibrillation: Secondary | ICD-10-CM | POA: Diagnosis not present

## 2022-11-16 LAB — CBC
HCT: 33.5 % — ABNORMAL LOW (ref 39.0–52.0)
Hemoglobin: 11 g/dL — ABNORMAL LOW (ref 13.0–17.0)
MCH: 31.5 pg (ref 26.0–34.0)
MCHC: 32.8 g/dL (ref 30.0–36.0)
MCV: 96 fL (ref 80.0–100.0)
Platelets: 181 10*3/uL (ref 150–400)
RBC: 3.49 MIL/uL — ABNORMAL LOW (ref 4.22–5.81)
RDW: 15.1 % (ref 11.5–15.5)
WBC: 6.2 10*3/uL (ref 4.0–10.5)
nRBC: 0 % (ref 0.0–0.2)

## 2022-11-16 LAB — RENAL FUNCTION PANEL
Albumin: 3.5 g/dL (ref 3.5–5.0)
Anion gap: 13 (ref 5–15)
BUN: 42 mg/dL — ABNORMAL HIGH (ref 8–23)
CO2: 25 mmol/L (ref 22–32)
Calcium: 9 mg/dL (ref 8.9–10.3)
Chloride: 94 mmol/L — ABNORMAL LOW (ref 98–111)
Creatinine, Ser: 7 mg/dL — ABNORMAL HIGH (ref 0.61–1.24)
GFR, Estimated: 7 mL/min — ABNORMAL LOW (ref >60)
Glucose, Bld: 86 mg/dL (ref 70–99)
Phosphorus: 4.1 mg/dL (ref 2.5–4.6)
Potassium: 3.8 mmol/L (ref 3.5–5.1)
Sodium: 132 mmol/L — ABNORMAL LOW (ref 135–145)

## 2022-11-16 MED ORDER — SPIRONOLACTONE 12.5 MG HALF TABLET
12.5000 mg | ORAL_TABLET | Freq: Every day | ORAL | Status: DC
  Administered 2022-11-16 – 2022-11-17 (×2): 12.5 mg via ORAL
  Filled 2022-11-16 (×2): qty 1

## 2022-11-16 MED ORDER — CHLORHEXIDINE GLUCONATE CLOTH 2 % EX PADS
6.0000 | MEDICATED_PAD | Freq: Every day | CUTANEOUS | Status: DC
  Administered 2022-11-17: 6 via TOPICAL

## 2022-11-16 MED ORDER — LORAZEPAM 1 MG PO TABS
1.0000 mg | ORAL_TABLET | Freq: Once | ORAL | Status: AC
  Administered 2022-11-16: 1 mg via ORAL
  Filled 2022-11-16: qty 1

## 2022-11-16 MED ORDER — LORAZEPAM 2 MG/ML IJ SOLN: 1.0000 mg | Freq: Once | INTRAMUSCULAR | Status: AC

## 2022-11-16 NOTE — Plan of Care (Signed)
  Problem: Education: Goal: Knowledge of General Education information will improve Description: Including pain rating scale, medication(s)/side effects and non-pharmacologic comfort measures Outcome: Progressing   Problem: Health Behavior/Discharge Planning: Goal: Ability to manage health-related needs will improve Outcome: Progressing   Problem: Clinical Measurements: Goal: Ability to maintain clinical measurements within normal limits will improve Outcome: Progressing Goal: Will remain free from infection Outcome: Progressing Goal: Diagnostic test results will improve Outcome: Progressing Goal: Respiratory complications will improve Outcome: Progressing Goal: Cardiovascular complication will be avoided Outcome: Progressing   Problem: Activity: Goal: Risk for activity intolerance will decrease Outcome: Progressing   Problem: Nutrition: Goal: Adequate nutrition will be maintained Outcome: Progressing   Problem: Coping: Goal: Level of anxiety will decrease Outcome: Progressing   Problem: Elimination: Goal: Will not experience complications related to bowel motility Outcome: Progressing Goal: Will not experience complications related to urinary retention Outcome: Progressing   Problem: Pain Managment: Goal: General experience of comfort will improve Outcome: Progressing   Problem: Safety: Goal: Ability to remain free from injury will improve Outcome: Progressing   Problem: Skin Integrity: Goal: Risk for impaired skin integrity will decrease Outcome: Progressing   Problem: Education: Goal: Ability to demonstrate management of disease process will improve Outcome: Progressing Goal: Ability to verbalize understanding of medication therapies will improve Outcome: Progressing Goal: Individualized Educational Video(s) Outcome: Progressing   Problem: Activity: Goal: Capacity to carry out activities will improve Outcome: Progressing   Problem: Cardiac: Goal:  Ability to achieve and maintain adequate cardiopulmonary perfusion will improve Outcome: Progressing   Problem: Safety: Goal: Non-violent Restraint(s) Outcome: Progressing

## 2022-11-16 NOTE — Progress Notes (Addendum)
Patient has been confused and has been trying to get out of bed, he has dementia, is not agitated or anxious but just confused, pulled out his IV, pulled his dressing on the left hand, chart history reveals its his baseline. PRN valium given with no good effect on him, patient HR is also very irregular ranging from 60s- 140s sustaining at 70s-80s, no intervention neeeded for HR, redirecting him,, is sleeping at this time. Will continue to monitor and will endorse to the oncoming RN

## 2022-11-16 NOTE — Plan of Care (Signed)
  Problem: Education: Goal: Knowledge of General Education information will improve Description: Including pain rating scale, medication(s)/side effects and non-pharmacologic comfort measures Outcome: Progressing   Problem: Health Behavior/Discharge Planning: Goal: Ability to manage health-related needs will improve Outcome: Progressing   Problem: Clinical Measurements: Goal: Ability to maintain clinical measurements within normal limits will improve Outcome: Progressing Goal: Will remain free from infection Outcome: Progressing Goal: Diagnostic test results will improve Outcome: Progressing Goal: Respiratory complications will improve Outcome: Progressing Goal: Cardiovascular complication will be avoided Outcome: Progressing   Problem: Activity: Goal: Risk for activity intolerance will decrease Outcome: Progressing   Problem: Nutrition: Goal: Adequate nutrition will be maintained Outcome: Progressing   Problem: Coping: Goal: Level of anxiety will decrease Outcome: Progressing   Problem: Elimination: Goal: Will not experience complications related to bowel motility Outcome: Progressing Goal: Will not experience complications related to urinary retention Outcome: Progressing   Problem: Pain Managment: Goal: General experience of comfort will improve Outcome: Progressing   Problem: Safety: Goal: Ability to remain free from injury will improve Outcome: Progressing   Problem: Skin Integrity: Goal: Risk for impaired skin integrity will decrease Outcome: Progressing   Problem: Education: Goal: Ability to demonstrate management of disease process will improve Outcome: Progressing Goal: Ability to verbalize understanding of medication therapies will improve Outcome: Progressing Goal: Individualized Educational Video(s) Outcome: Progressing   Problem: Activity: Goal: Capacity to carry out activities will improve Outcome: Progressing   Problem: Cardiac: Goal:  Ability to achieve and maintain adequate cardiopulmonary perfusion will improve Outcome: Progressing   Problem: Cardiac: Goal: Ability to achieve and maintain adequate cardiopulmonary perfusion will improve Outcome: Progressing   Problem: Safety: Goal: Non-violent Restraint(s) Outcome: Progressing

## 2022-11-16 NOTE — Progress Notes (Signed)
   11/16/22 0800  ReDS Vest / Clip  Station Marker D  Ruler Value 33  ReDS Value Range < 36  ReDS Actual Value 33

## 2022-11-16 NOTE — Progress Notes (Signed)
Transferred from ICU , VSS , call bell placed within reach , on Tele    11/16/22 1424  Vitals  Temp (!) 97.5 F (36.4 C)  Temp Source Oral  BP (!) 168/88  MAP (mmHg) 108  BP Location Left Arm  BP Method Automatic  Patient Position (if appropriate) Lying  Pulse Rate 63  Pulse Rate Source Dinamap  MEWS COLOR  MEWS Score Color Green  Oxygen Therapy  SpO2 97 %  O2 Device Room Air  MEWS Score  MEWS Temp 0  MEWS Systolic 0  MEWS Pulse 0  MEWS RR 0  MEWS LOC 0  MEWS Score 0

## 2022-11-16 NOTE — Progress Notes (Signed)
11/13/2022 Medical Rec #:  [id number]            Height:       74.0 in Accession #:    [id number]           Weight:       204.1 lb Date of Birth:  08-30-1940            BSA:          2.193 m Patient Age:    82 years             BP:           152/80 mmHg Patient Gender: M                    HR:           85 bpm. Exam Location:  [facility] Procedure: 2D Echo, Cardiac Doppler and Color Doppler Indications:    Congestive Heart Failure I50.9  History:        Patient has prior history of Echocardiogram examinations, most                 recent 09/13/2020. CAD and Previous Myocardial Infarction, Prior                 CABG, TIA; Risk Factors:Hypertension and Dyslipidemia.  Sonographer:    Celesta Gentile RCS Referring Phys: [phone] [facility]  Sonographer Comments: Image acquisition challenging due to patient behavioral factors. IMPRESSIONS  1. Left ventricular ejection fraction, by estimation, is 40 to 45%. The left ventricle has mildly decreased function. The left ventricle demonstrates global hypokinesis. There is mild left ventricular hypertrophy. Left ventricular diastolic parameters  are consistent with Grade II diastolic dysfunction (pseudonormalization). Elevated left atrial pressure.  2. Right ventricular systolic function is normal. The right ventricular size is normal. Tricuspid regurgitation signal is inadequate for assessing PA pressure.  3. Left atrial size was severely dilated.  4. Right atrial size was mildly dilated.  5. The mitral valve is abnormal. Mild mitral valve regurgitation. No evidence of mitral stenosis.  6. The tricuspid valve is abnormal.  7. The aortic valve has an indeterminant number of cusps. There is moderate calcification of the aortic valve. There is moderate thickening of the aortic valve. Aortic valve regurgitation is not visualized. Moderate aortic valve stenosis. Aortic valve mean gradient measures 18.8 mmHg. Aortic valve peak gradient measures 44.4 mmHg. Aortic valve area, by VTI measures 1.10 cm. DI 0.29  8. The inferior vena cava is dilated in size with <50% respiratory variability, suggesting right atrial pressure of 15 mmHg. FINDINGS  Left Ventricle: Left ventricular ejection fraction, by estimation, is 40 to 45%. The left ventricle has mildly decreased function. The left ventricle demonstrates global hypokinesis. The left ventricular internal cavity size was normal in size. There is  mild left ventricular hypertrophy. Left ventricular diastolic parameters are consistent with Grade II diastolic dysfunction (pseudonormalization). Elevated left atrial pressure. Right Ventricle: The right ventricular size is normal. Right vetricular wall thickness was not well visualized. Right ventricular systolic function is normal. Tricuspid regurgitation signal is inadequate for assessing PA pressure. Left Atrium: Left atrial size was severely dilated. Right Atrium: Right atrial size was mildly dilated. Pericardium: There is no evidence of pericardial effusion. Mitral Valve: The mitral valve is abnormal. Mild mitral valve regurgitation. No evidence of mitral valve  stenosis. Tricuspid Valve: The tricuspid valve is abnormal. Tricuspid valve regurgitation is mild . No evidence of tricuspid stenosis. Aortic  PROGRESS NOTE  Ronald [name] EAV:[id number] DOB: 05/12/1940 DOA: 11/12/2022 PCP: [name], MD  Brief History:  82 year old male with a history of coronary disease status post CABG, ESRD, hypertension, stroke, chronic impairment presenting with 4 to 5-day history of chest pain, orthopnea, and associated nausea and vomiting.  The patient went to dialysis on 11/12/22 and stayed the whole time.  The patient was confused at this time.  Spouse states that at baseline the patient has some cognitive impairment.  There is not been any fevers, chills, headache, neck pain abdominal pain.  There is no hematochezia or melena. In the ED, the patient was afebrile and hemodynamically stable.  Oxygen saturation 100% room air.  The patient was noted to have atrial fibrillation with RVR with heart rate in the 140s.  He was started on diltiazem drip. WBC 6.7, hemoglobin 10.8, platelets 200.  Sodium 132, potassium 4.0, bicarbonate 24, serum creatinine 5.19.  Cardiology and nephrology were consulted to assist with management.   Assessment/Plan:  NSTEMI -Troponins  1115>>2448>>9187 -Continue IV heparins>>stopp on 10/5 -Cardiology consult appreciated -not a candidate for invasive therapy -10/3 Echo EF 40-45%, G2DD, mild MR, mod AS -spouse prefers non-invasive management -add spiro 12.5 mg daily   Atrial fibrillation with RVR -Initially started on IV diltiazem>>transitioned to po coreg -personally reviewed telemetry -Patient has converted back to sinus, but continues to have frequent nonsustained episodes RVR>>frequency improving with increased coreg dose to 25 mg bid -allow cardiology to make final adjustments to meds prior to d/c -TSH--3.530 -10/3 Echo EF 40-45%, G2DD, mild MR, mod AS -increase coreg to 25 mg bid -transition to apixaban -may need IV amio   ESRD -Patient dialyzes Monday, Wednesday, Friday -Last dialysis 11/12/22 -Nephrology consulted for maintenance dialysis -Continue  phosphate binders -last HD 10/4   Essential hypertension -Patient on carvedilol at home--6.25 mg twice daily at home -increase carvedilol to 25 mg bid as discussed above -add spiro 12.5 mg daily   Major neurocognitive disorder -Spouse endorses gradual chronic decline over the last 6 months -Continue Namenda   Mixed hyperlipidemia -Continue statin   Tobacco abuse -Tobacco cessation discussed   Hospital delirium -daughter does not want haldol -continue valium prn         Family Communication:   spouse updated 10/6   Consultants:  cardiology, nephrology   Code Status:  DNR--confirmed with spouse on 10/3   DVT Prophylaxis:  IV Heparin      Procedures: As Listed in Progress Note Above   Antibiotics: None      Subjective: Patient denies fevers, chills, headache, chest pain, dyspnea, nausea, vomiting, diarrhea, abdominal pain, dysuria, hematuria, hematochezia, and melena.   Objective: Vitals:   11/16/22 0700 11/16/22 0705 11/16/22 0806 11/16/22 0808  BP: (!) 133/36   (!) 162/91  Pulse: 65   83  Resp: 13   (!) 21  Temp:   97.9 F (36.6 C)   TempSrc:   Oral   SpO2: 97%   100%  Weight:  91.6 kg    Height:        Intake/Output Summary (Last 24 hours) at 11/16/2022 7829 Last data filed at 11/15/2022 1300 Gross per 24 hour  Intake 240 ml  Output --  Net 240 ml   Weight change:  Exam:  General:  Pt is alert, follows commands appropriately, not in acute distress HEENT: No icterus, No thrush, No neck mass, [place]/AT Cardiovascular: RRR, S1/S2, no rubs, no gallops  PROGRESS NOTE  Ronald [name] EAV:[id number] DOB: 05/12/1940 DOA: 11/12/2022 PCP: [name], MD  Brief History:  82 year old male with a history of coronary disease status post CABG, ESRD, hypertension, stroke, chronic impairment presenting with 4 to 5-day history of chest pain, orthopnea, and associated nausea and vomiting.  The patient went to dialysis on 11/12/22 and stayed the whole time.  The patient was confused at this time.  Spouse states that at baseline the patient has some cognitive impairment.  There is not been any fevers, chills, headache, neck pain abdominal pain.  There is no hematochezia or melena. In the ED, the patient was afebrile and hemodynamically stable.  Oxygen saturation 100% room air.  The patient was noted to have atrial fibrillation with RVR with heart rate in the 140s.  He was started on diltiazem drip. WBC 6.7, hemoglobin 10.8, platelets 200.  Sodium 132, potassium 4.0, bicarbonate 24, serum creatinine 5.19.  Cardiology and nephrology were consulted to assist with management.   Assessment/Plan:  NSTEMI -Troponins  1115>>2448>>9187 -Continue IV heparins>>stopp on 10/5 -Cardiology consult appreciated -not a candidate for invasive therapy -10/3 Echo EF 40-45%, G2DD, mild MR, mod AS -spouse prefers non-invasive management -add spiro 12.5 mg daily   Atrial fibrillation with RVR -Initially started on IV diltiazem>>transitioned to po coreg -personally reviewed telemetry -Patient has converted back to sinus, but continues to have frequent nonsustained episodes RVR>>frequency improving with increased coreg dose to 25 mg bid -allow cardiology to make final adjustments to meds prior to d/c -TSH--3.530 -10/3 Echo EF 40-45%, G2DD, mild MR, mod AS -increase coreg to 25 mg bid -transition to apixaban -may need IV amio   ESRD -Patient dialyzes Monday, Wednesday, Friday -Last dialysis 11/12/22 -Nephrology consulted for maintenance dialysis -Continue  phosphate binders -last HD 10/4   Essential hypertension -Patient on carvedilol at home--6.25 mg twice daily at home -increase carvedilol to 25 mg bid as discussed above -add spiro 12.5 mg daily   Major neurocognitive disorder -Spouse endorses gradual chronic decline over the last 6 months -Continue Namenda   Mixed hyperlipidemia -Continue statin   Tobacco abuse -Tobacco cessation discussed   Hospital delirium -daughter does not want haldol -continue valium prn         Family Communication:   spouse updated 10/6   Consultants:  cardiology, nephrology   Code Status:  DNR--confirmed with spouse on 10/3   DVT Prophylaxis:  IV Heparin      Procedures: As Listed in Progress Note Above   Antibiotics: None      Subjective: Patient denies fevers, chills, headache, chest pain, dyspnea, nausea, vomiting, diarrhea, abdominal pain, dysuria, hematuria, hematochezia, and melena.   Objective: Vitals:   11/16/22 0700 11/16/22 0705 11/16/22 0806 11/16/22 0808  BP: (!) 133/36   (!) 162/91  Pulse: 65   83  Resp: 13   (!) 21  Temp:   97.9 F (36.6 C)   TempSrc:   Oral   SpO2: 97%   100%  Weight:  91.6 kg    Height:        Intake/Output Summary (Last 24 hours) at 11/16/2022 7829 Last data filed at 11/15/2022 1300 Gross per 24 hour  Intake 240 ml  Output --  Net 240 ml   Weight change:  Exam:  General:  Pt is alert, follows commands appropriately, not in acute distress HEENT: No icterus, No thrush, No neck mass, [place]/AT Cardiovascular: RRR, S1/S2, no rubs, no gallops  PROGRESS NOTE  Ronald [name] EAV:[id number] DOB: 05/12/1940 DOA: 11/12/2022 PCP: [name], MD  Brief History:  82 year old male with a history of coronary disease status post CABG, ESRD, hypertension, stroke, chronic impairment presenting with 4 to 5-day history of chest pain, orthopnea, and associated nausea and vomiting.  The patient went to dialysis on 11/12/22 and stayed the whole time.  The patient was confused at this time.  Spouse states that at baseline the patient has some cognitive impairment.  There is not been any fevers, chills, headache, neck pain abdominal pain.  There is no hematochezia or melena. In the ED, the patient was afebrile and hemodynamically stable.  Oxygen saturation 100% room air.  The patient was noted to have atrial fibrillation with RVR with heart rate in the 140s.  He was started on diltiazem drip. WBC 6.7, hemoglobin 10.8, platelets 200.  Sodium 132, potassium 4.0, bicarbonate 24, serum creatinine 5.19.  Cardiology and nephrology were consulted to assist with management.   Assessment/Plan:  NSTEMI -Troponins  1115>>2448>>9187 -Continue IV heparins>>stopp on 10/5 -Cardiology consult appreciated -not a candidate for invasive therapy -10/3 Echo EF 40-45%, G2DD, mild MR, mod AS -spouse prefers non-invasive management -add spiro 12.5 mg daily   Atrial fibrillation with RVR -Initially started on IV diltiazem>>transitioned to po coreg -personally reviewed telemetry -Patient has converted back to sinus, but continues to have frequent nonsustained episodes RVR>>frequency improving with increased coreg dose to 25 mg bid -allow cardiology to make final adjustments to meds prior to d/c -TSH--3.530 -10/3 Echo EF 40-45%, G2DD, mild MR, mod AS -increase coreg to 25 mg bid -transition to apixaban -may need IV amio   ESRD -Patient dialyzes Monday, Wednesday, Friday -Last dialysis 11/12/22 -Nephrology consulted for maintenance dialysis -Continue  phosphate binders -last HD 10/4   Essential hypertension -Patient on carvedilol at home--6.25 mg twice daily at home -increase carvedilol to 25 mg bid as discussed above -add spiro 12.5 mg daily   Major neurocognitive disorder -Spouse endorses gradual chronic decline over the last 6 months -Continue Namenda   Mixed hyperlipidemia -Continue statin   Tobacco abuse -Tobacco cessation discussed   Hospital delirium -daughter does not want haldol -continue valium prn         Family Communication:   spouse updated 10/6   Consultants:  cardiology, nephrology   Code Status:  DNR--confirmed with spouse on 10/3   DVT Prophylaxis:  IV Heparin      Procedures: As Listed in Progress Note Above   Antibiotics: None      Subjective: Patient denies fevers, chills, headache, chest pain, dyspnea, nausea, vomiting, diarrhea, abdominal pain, dysuria, hematuria, hematochezia, and melena.   Objective: Vitals:   11/16/22 0700 11/16/22 0705 11/16/22 0806 11/16/22 0808  BP: (!) 133/36   (!) 162/91  Pulse: 65   83  Resp: 13   (!) 21  Temp:   97.9 F (36.6 C)   TempSrc:   Oral   SpO2: 97%   100%  Weight:  91.6 kg    Height:        Intake/Output Summary (Last 24 hours) at 11/16/2022 7829 Last data filed at 11/15/2022 1300 Gross per 24 hour  Intake 240 ml  Output --  Net 240 ml   Weight change:  Exam:  General:  Pt is alert, follows commands appropriately, not in acute distress HEENT: No icterus, No thrush, No neck mass, [place]/AT Cardiovascular: RRR, S1/S2, no rubs, no gallops  PROGRESS NOTE  Ronald [name] EAV:[id number] DOB: 05/12/1940 DOA: 11/12/2022 PCP: [name], MD  Brief History:  82 year old male with a history of coronary disease status post CABG, ESRD, hypertension, stroke, chronic impairment presenting with 4 to 5-day history of chest pain, orthopnea, and associated nausea and vomiting.  The patient went to dialysis on 11/12/22 and stayed the whole time.  The patient was confused at this time.  Spouse states that at baseline the patient has some cognitive impairment.  There is not been any fevers, chills, headache, neck pain abdominal pain.  There is no hematochezia or melena. In the ED, the patient was afebrile and hemodynamically stable.  Oxygen saturation 100% room air.  The patient was noted to have atrial fibrillation with RVR with heart rate in the 140s.  He was started on diltiazem drip. WBC 6.7, hemoglobin 10.8, platelets 200.  Sodium 132, potassium 4.0, bicarbonate 24, serum creatinine 5.19.  Cardiology and nephrology were consulted to assist with management.   Assessment/Plan:  NSTEMI -Troponins  1115>>2448>>9187 -Continue IV heparins>>stopp on 10/5 -Cardiology consult appreciated -not a candidate for invasive therapy -10/3 Echo EF 40-45%, G2DD, mild MR, mod AS -spouse prefers non-invasive management -add spiro 12.5 mg daily   Atrial fibrillation with RVR -Initially started on IV diltiazem>>transitioned to po coreg -personally reviewed telemetry -Patient has converted back to sinus, but continues to have frequent nonsustained episodes RVR>>frequency improving with increased coreg dose to 25 mg bid -allow cardiology to make final adjustments to meds prior to d/c -TSH--3.530 -10/3 Echo EF 40-45%, G2DD, mild MR, mod AS -increase coreg to 25 mg bid -transition to apixaban -may need IV amio   ESRD -Patient dialyzes Monday, Wednesday, Friday -Last dialysis 11/12/22 -Nephrology consulted for maintenance dialysis -Continue  phosphate binders -last HD 10/4   Essential hypertension -Patient on carvedilol at home--6.25 mg twice daily at home -increase carvedilol to 25 mg bid as discussed above -add spiro 12.5 mg daily   Major neurocognitive disorder -Spouse endorses gradual chronic decline over the last 6 months -Continue Namenda   Mixed hyperlipidemia -Continue statin   Tobacco abuse -Tobacco cessation discussed   Hospital delirium -daughter does not want haldol -continue valium prn         Family Communication:   spouse updated 10/6   Consultants:  cardiology, nephrology   Code Status:  DNR--confirmed with spouse on 10/3   DVT Prophylaxis:  IV Heparin      Procedures: As Listed in Progress Note Above   Antibiotics: None      Subjective: Patient denies fevers, chills, headache, chest pain, dyspnea, nausea, vomiting, diarrhea, abdominal pain, dysuria, hematuria, hematochezia, and melena.   Objective: Vitals:   11/16/22 0700 11/16/22 0705 11/16/22 0806 11/16/22 0808  BP: (!) 133/36   (!) 162/91  Pulse: 65   83  Resp: 13   (!) 21  Temp:   97.9 F (36.6 C)   TempSrc:   Oral   SpO2: 97%   100%  Weight:  91.6 kg    Height:        Intake/Output Summary (Last 24 hours) at 11/16/2022 7829 Last data filed at 11/15/2022 1300 Gross per 24 hour  Intake 240 ml  Output --  Net 240 ml   Weight change:  Exam:  General:  Pt is alert, follows commands appropriately, not in acute distress HEENT: No icterus, No thrush, No neck mass, [place]/AT Cardiovascular: RRR, S1/S2, no rubs, no gallops

## 2022-11-16 NOTE — Progress Notes (Signed)
Patient removed IV , will not keep telemetry on , will attempt another IV

## 2022-11-17 DIAGNOSIS — I4891 Unspecified atrial fibrillation: Secondary | ICD-10-CM | POA: Diagnosis not present

## 2022-11-17 DIAGNOSIS — I480 Paroxysmal atrial fibrillation: Secondary | ICD-10-CM

## 2022-11-17 DIAGNOSIS — I10 Essential (primary) hypertension: Secondary | ICD-10-CM

## 2022-11-17 DIAGNOSIS — I2510 Atherosclerotic heart disease of native coronary artery without angina pectoris: Secondary | ICD-10-CM | POA: Diagnosis not present

## 2022-11-17 DIAGNOSIS — I429 Cardiomyopathy, unspecified: Secondary | ICD-10-CM | POA: Diagnosis not present

## 2022-11-17 DIAGNOSIS — N186 End stage renal disease: Secondary | ICD-10-CM | POA: Diagnosis not present

## 2022-11-17 DIAGNOSIS — F03C18 Unspecified dementia, severe, with other behavioral disturbance: Secondary | ICD-10-CM

## 2022-11-17 DIAGNOSIS — Z992 Dependence on renal dialysis: Secondary | ICD-10-CM

## 2022-11-17 DIAGNOSIS — E7849 Other hyperlipidemia: Secondary | ICD-10-CM

## 2022-11-17 DIAGNOSIS — I214 Non-ST elevation (NSTEMI) myocardial infarction: Secondary | ICD-10-CM | POA: Diagnosis not present

## 2022-11-17 LAB — CBC
HCT: 31.6 % — ABNORMAL LOW (ref 39.0–52.0)
Hemoglobin: 10 g/dL — ABNORMAL LOW (ref 13.0–17.0)
MCH: 31 pg (ref 26.0–34.0)
MCHC: 31.6 g/dL (ref 30.0–36.0)
MCV: 97.8 fL (ref 80.0–100.0)
Platelets: 164 10*3/uL (ref 150–400)
RBC: 3.23 MIL/uL — ABNORMAL LOW (ref 4.22–5.81)
RDW: 14.9 % (ref 11.5–15.5)
WBC: 5.4 10*3/uL (ref 4.0–10.5)
nRBC: 0 % (ref 0.0–0.2)

## 2022-11-17 LAB — RENAL FUNCTION PANEL
Albumin: 3.3 g/dL — ABNORMAL LOW (ref 3.5–5.0)
Anion gap: 13 (ref 5–15)
BUN: 53 mg/dL — ABNORMAL HIGH (ref 8–23)
CO2: 25 mmol/L (ref 22–32)
Calcium: 8.9 mg/dL (ref 8.9–10.3)
Chloride: 95 mmol/L — ABNORMAL LOW (ref 98–111)
Creatinine, Ser: 8.22 mg/dL — ABNORMAL HIGH (ref 0.61–1.24)
GFR, Estimated: 6 mL/min — ABNORMAL LOW (ref >60)
Glucose, Bld: 90 mg/dL (ref 70–99)
Phosphorus: 4.2 mg/dL (ref 2.5–4.6)
Potassium: 4 mmol/L (ref 3.5–5.1)
Sodium: 133 mmol/L — ABNORMAL LOW (ref 135–145)

## 2022-11-17 MED ORDER — CARVEDILOL 25 MG PO TABS: 25.0000 mg | ORAL_TABLET | Freq: Two times a day (BID) | ORAL | 1 refills | Status: DC

## 2022-11-17 MED ORDER — ASPIRIN 81 MG PO TBEC: 81.0000 mg | DELAYED_RELEASE_TABLET | Freq: Every day | ORAL | Status: DC

## 2022-11-17 NOTE — Plan of Care (Signed)
  Problem: Education: Goal: Knowledge of General Education information will improve Description: Including pain rating scale, medication(s)/side effects and non-pharmacologic comfort measures Outcome: Not Progressing   Problem: Activity: Goal: Risk for activity intolerance will decrease Outcome: Progressing   Problem: Elimination: Goal: Will not experience complications related to bowel motility Outcome: Progressing   Problem: Safety: Goal: Ability to remain free from injury will improve Outcome: Not Progressing   Problem: Skin Integrity: Goal: Risk for impaired skin integrity will decrease Outcome: Progressing   Problem: Safety: Goal: Non-violent Restraint(s) Outcome: Progressing

## 2022-11-17 NOTE — Plan of Care (Signed)
  Problem: Education: Goal: Knowledge of General Education information will improve Description Including pain rating scale, medication(s)/side effects and non-pharmacologic comfort measures Outcome: Progressing   

## 2022-11-17 NOTE — Discharge Summary (Addendum)
for 2 weeks, then increase to 1 tablet (5 mg) twice a day   tamsulosin 0.4 MG Caps capsule Commonly known as: FLOMAX Take 1 capsule (0.4 mg total) by mouth daily after supper.   traMADol 50 MG tablet Commonly known as: ULTRAM Take 50 mg by mouth 3 (three) times daily as needed  (pain.).   zolpidem 10 MG tablet Commonly known as: AMBIEN Take 10 mg by mouth at bedtime.        Discharge Exam: Filed Weights   11/15/22 0456 11/16/22 0705 11/17/22 0500  Weight: 89.1 kg 91.6 kg 89.4 kg   HEENT:  [place]/AT, No thrush, no icterus CV:  RRR, no rub, no S3, no S4 Lung:  CTA, no wheeze, no rhonchi Abd:  soft/+BS, NT Ext:  No edema, no lymphangitis, no synovitis, no rash   Condition at discharge: stable  The results of significant diagnostics from this hospitalization (including imaging, microbiology, ancillary and laboratory) are listed below for reference.   Imaging Studies: ECHOCARDIOGRAM COMPLETE  Result Date: 11/13/2022    ECHOCARDIOGRAM REPORT   Patient Name:   Ronald [name] Date of Exam: 11/13/2022 Medical Rec #:  [id number]            Height:       74.0 in Accession #:    [id number]           Weight:       204.1 lb Date of Birth:  28-Dec-1940            BSA:          2.193 m Patient Age:    82 years             BP:           152/80 mmHg Patient Gender: M                    HR:           85 bpm. [facility] Procedure: 2D Echo, Cardiac Doppler and Color Doppler Indications:    Congestive Heart Failure I50.9  History:        Patient has prior history of Echocardiogram examinations, most                 recent 09/13/2020. CAD and Previous Myocardial Infarction, Prior                 CABG, TIA; Risk Factors:Hypertension and Dyslipidemia.  Sonographer:    Celesta Gentile RCS Referring Phys: [phone] Heloise Beecham [facility]  Sonographer Comments: Image acquisition challenging due to patient behavioral factors. IMPRESSIONS  1. Left ventricular ejection fraction, by estimation, is 40 to 45%. The left ventricle has mildly decreased function. The left ventricle demonstrates global hypokinesis. There is mild left ventricular hypertrophy. Left ventricular diastolic parameters are consistent with Grade II diastolic dysfunction (pseudonormalization). Elevated left atrial  pressure.  2. Right ventricular systolic function is normal. The right ventricular size is normal. Tricuspid regurgitation signal is inadequate for assessing PA pressure.  3. Left atrial size was severely dilated.  4. Right atrial size was mildly dilated.  5. The mitral valve is abnormal. Mild mitral valve regurgitation. No evidence of mitral stenosis.  6. The tricuspid valve is abnormal.  7. The aortic valve has an indeterminant number of cusps. There is moderate calcification of the aortic valve. There is moderate thickening of the aortic valve. Aortic valve regurgitation is not visualized. Moderate aortic valve stenosis. Aortic valve mean  Physician Discharge Summary   Patient: Ronald [name] MRN: [id number] DOB: 1940-09-25  Admit date:     11/12/2022  Discharge date: 11/17/22  Discharge Physician: Onalee Hua Mosiah Bastin   PCP: [name], MD   Recommendations at discharge:   Please follow up with primary care provider within 1-2 weeks  Please repeat BMP and CBC in one week      Hospital Course: 82 year old male with a history of coronary disease status post CABG, ESRD, hypertension, stroke, chronic impairment presenting with 4 to 5-day history of chest pain, orthopnea, and associated nausea and vomiting.  The patient went to dialysis on 11/12/22 and stayed the whole time.  The patient was confused at this time.  Spouse states that at baseline the patient has some cognitive impairment.  There is not been any fevers, chills, headache, neck pain abdominal pain.  There is no hematochezia or melena. In the ED, the patient was afebrile and hemodynamically stable.  Oxygen saturation 100% room air.  The patient was noted to have atrial fibrillation with RVR with heart rate in the 140s.  He was started on diltiazem drip. WBC 6.7, hemoglobin 10.8, platelets 200.  Sodium 132, potassium 4.0, bicarbonate 24, serum creatinine 5.19.  Cardiology and nephrology were consulted to assist with management.  Assessment and Plan:  NSTEMI -Troponins  1115>>2448>>9187 -Continue IV heparins>>stopp on 10/5 -Cardiology consult appreciated -not a candidate for invasive therapy -10/3 Echo EF 40-45%, G2DD, mild MR, mod AS -spouse prefers non-invasive management   Atrial fibrillation with RVR -Initially started on IV diltiazem>>transitioned to po coreg -personally reviewed telemetry -Patient has converted back to sinus, but continues to have frequent nonsustained episodes RVR>>frequency improving with increased coreg dose to 25 mg bid -allow cardiology to make final adjustments to meds prior to d/c -TSH--3.530 -10/3 Echo EF 40-45%, G2DD, mild MR, mod  AS -increased coreg to 25 mg bid -transitioned to apixaban--ultimately d/ced per cardiology due to severe dementia, confusion and possible fall risk.    ESRD -Patient dialyzes Monday, Wednesday, Friday -Last dialysis 11/12/22 -Nephrology consulted for maintenance dialysis -Continue phosphate binders -last HD 10/4   Essential hypertension -Patient on carvedilol at home--6.25 mg twice daily at home -increase carvedilol to 25 mg bid as discussed above -added spiro 12.5 mg daily   Major neurocognitive disorder -Spouse endorses gradual chronic decline over the last 6 months -Continue Namenda   Mixed hyperlipidemia -Continue statin   Tobacco abuse -Tobacco cessation discussed   Hospital delirium -daughter does not want haldol -continue valium prn         Consultants: cardiology Procedures performed: none  Disposition: Home Diet recommendation:  Renal diet DISCHARGE MEDICATION: Allergies as of 11/17/2022   No Known Allergies      Medication List     TAKE these medications    aspirin EC 81 MG tablet Take 1 tablet (81 mg total) by mouth daily. Swallow whole.   atorvastatin 80 MG tablet Commonly known as: LIPITOR Take 80 mg by mouth in the morning.   calcium acetate 667 MG capsule Commonly known as: PHOSLO Take 1,334 mg by mouth 3 (three) times daily. citracal   carvedilol 25 MG tablet Commonly known as: COREG Take 1 tablet (25 mg total) by mouth 2 (two) times daily with a meal. What changed:  medication strength how much to take   Magnesium 250 MG Tabs Take 1 tablet by mouth at bedtime.   memantine 5 MG tablet Commonly known as: NAMENDA Take 1 tablet (5 mg at night)  for 2 weeks, then increase to 1 tablet (5 mg) twice a day   tamsulosin 0.4 MG Caps capsule Commonly known as: FLOMAX Take 1 capsule (0.4 mg total) by mouth daily after supper.   traMADol 50 MG tablet Commonly known as: ULTRAM Take 50 mg by mouth 3 (three) times daily as needed  (pain.).   zolpidem 10 MG tablet Commonly known as: AMBIEN Take 10 mg by mouth at bedtime.        Discharge Exam: Filed Weights   11/15/22 0456 11/16/22 0705 11/17/22 0500  Weight: 89.1 kg 91.6 kg 89.4 kg   HEENT:  [place]/AT, No thrush, no icterus CV:  RRR, no rub, no S3, no S4 Lung:  CTA, no wheeze, no rhonchi Abd:  soft/+BS, NT Ext:  No edema, no lymphangitis, no synovitis, no rash   Condition at discharge: stable  The results of significant diagnostics from this hospitalization (including imaging, microbiology, ancillary and laboratory) are listed below for reference.   Imaging Studies: ECHOCARDIOGRAM COMPLETE  Result Date: 11/13/2022    ECHOCARDIOGRAM REPORT   Patient Name:   Ronald [name] Date of Exam: 11/13/2022 Medical Rec #:  [id number]            Height:       74.0 in Accession #:    [id number]           Weight:       204.1 lb Date of Birth:  28-Dec-1940            BSA:          2.193 m Patient Age:    82 years             BP:           152/80 mmHg Patient Gender: M                    HR:           85 bpm. [facility] Procedure: 2D Echo, Cardiac Doppler and Color Doppler Indications:    Congestive Heart Failure I50.9  History:        Patient has prior history of Echocardiogram examinations, most                 recent 09/13/2020. CAD and Previous Myocardial Infarction, Prior                 CABG, TIA; Risk Factors:Hypertension and Dyslipidemia.  Sonographer:    Celesta Gentile RCS Referring Phys: [phone] Heloise Beecham [facility]  Sonographer Comments: Image acquisition challenging due to patient behavioral factors. IMPRESSIONS  1. Left ventricular ejection fraction, by estimation, is 40 to 45%. The left ventricle has mildly decreased function. The left ventricle demonstrates global hypokinesis. There is mild left ventricular hypertrophy. Left ventricular diastolic parameters are consistent with Grade II diastolic dysfunction (pseudonormalization). Elevated left atrial  pressure.  2. Right ventricular systolic function is normal. The right ventricular size is normal. Tricuspid regurgitation signal is inadequate for assessing PA pressure.  3. Left atrial size was severely dilated.  4. Right atrial size was mildly dilated.  5. The mitral valve is abnormal. Mild mitral valve regurgitation. No evidence of mitral stenosis.  6. The tricuspid valve is abnormal.  7. The aortic valve has an indeterminant number of cusps. There is moderate calcification of the aortic valve. There is moderate thickening of the aortic valve. Aortic valve regurgitation is not visualized. Moderate aortic valve stenosis. Aortic valve mean  for 2 weeks, then increase to 1 tablet (5 mg) twice a day   tamsulosin 0.4 MG Caps capsule Commonly known as: FLOMAX Take 1 capsule (0.4 mg total) by mouth daily after supper.   traMADol 50 MG tablet Commonly known as: ULTRAM Take 50 mg by mouth 3 (three) times daily as needed  (pain.).   zolpidem 10 MG tablet Commonly known as: AMBIEN Take 10 mg by mouth at bedtime.        Discharge Exam: Filed Weights   11/15/22 0456 11/16/22 0705 11/17/22 0500  Weight: 89.1 kg 91.6 kg 89.4 kg   HEENT:  [place]/AT, No thrush, no icterus CV:  RRR, no rub, no S3, no S4 Lung:  CTA, no wheeze, no rhonchi Abd:  soft/+BS, NT Ext:  No edema, no lymphangitis, no synovitis, no rash   Condition at discharge: stable  The results of significant diagnostics from this hospitalization (including imaging, microbiology, ancillary and laboratory) are listed below for reference.   Imaging Studies: ECHOCARDIOGRAM COMPLETE  Result Date: 11/13/2022    ECHOCARDIOGRAM REPORT   Patient Name:   Ronald [name] Date of Exam: 11/13/2022 Medical Rec #:  [id number]            Height:       74.0 in Accession #:    [id number]           Weight:       204.1 lb Date of Birth:  28-Dec-1940            BSA:          2.193 m Patient Age:    82 years             BP:           152/80 mmHg Patient Gender: M                    HR:           85 bpm. [facility] Procedure: 2D Echo, Cardiac Doppler and Color Doppler Indications:    Congestive Heart Failure I50.9  History:        Patient has prior history of Echocardiogram examinations, most                 recent 09/13/2020. CAD and Previous Myocardial Infarction, Prior                 CABG, TIA; Risk Factors:Hypertension and Dyslipidemia.  Sonographer:    Celesta Gentile RCS Referring Phys: [phone] Heloise Beecham [facility]  Sonographer Comments: Image acquisition challenging due to patient behavioral factors. IMPRESSIONS  1. Left ventricular ejection fraction, by estimation, is 40 to 45%. The left ventricle has mildly decreased function. The left ventricle demonstrates global hypokinesis. There is mild left ventricular hypertrophy. Left ventricular diastolic parameters are consistent with Grade II diastolic dysfunction (pseudonormalization). Elevated left atrial  pressure.  2. Right ventricular systolic function is normal. The right ventricular size is normal. Tricuspid regurgitation signal is inadequate for assessing PA pressure.  3. Left atrial size was severely dilated.  4. Right atrial size was mildly dilated.  5. The mitral valve is abnormal. Mild mitral valve regurgitation. No evidence of mitral stenosis.  6. The tricuspid valve is abnormal.  7. The aortic valve has an indeterminant number of cusps. There is moderate calcification of the aortic valve. There is moderate thickening of the aortic valve. Aortic valve regurgitation is not visualized. Moderate aortic valve stenosis. Aortic valve mean  for 2 weeks, then increase to 1 tablet (5 mg) twice a day   tamsulosin 0.4 MG Caps capsule Commonly known as: FLOMAX Take 1 capsule (0.4 mg total) by mouth daily after supper.   traMADol 50 MG tablet Commonly known as: ULTRAM Take 50 mg by mouth 3 (three) times daily as needed  (pain.).   zolpidem 10 MG tablet Commonly known as: AMBIEN Take 10 mg by mouth at bedtime.        Discharge Exam: Filed Weights   11/15/22 0456 11/16/22 0705 11/17/22 0500  Weight: 89.1 kg 91.6 kg 89.4 kg   HEENT:  [place]/AT, No thrush, no icterus CV:  RRR, no rub, no S3, no S4 Lung:  CTA, no wheeze, no rhonchi Abd:  soft/+BS, NT Ext:  No edema, no lymphangitis, no synovitis, no rash   Condition at discharge: stable  The results of significant diagnostics from this hospitalization (including imaging, microbiology, ancillary and laboratory) are listed below for reference.   Imaging Studies: ECHOCARDIOGRAM COMPLETE  Result Date: 11/13/2022    ECHOCARDIOGRAM REPORT   Patient Name:   Ronald [name] Date of Exam: 11/13/2022 Medical Rec #:  [id number]            Height:       74.0 in Accession #:    [id number]           Weight:       204.1 lb Date of Birth:  28-Dec-1940            BSA:          2.193 m Patient Age:    82 years             BP:           152/80 mmHg Patient Gender: M                    HR:           85 bpm. [facility] Procedure: 2D Echo, Cardiac Doppler and Color Doppler Indications:    Congestive Heart Failure I50.9  History:        Patient has prior history of Echocardiogram examinations, most                 recent 09/13/2020. CAD and Previous Myocardial Infarction, Prior                 CABG, TIA; Risk Factors:Hypertension and Dyslipidemia.  Sonographer:    Celesta Gentile RCS Referring Phys: [phone] Heloise Beecham [facility]  Sonographer Comments: Image acquisition challenging due to patient behavioral factors. IMPRESSIONS  1. Left ventricular ejection fraction, by estimation, is 40 to 45%. The left ventricle has mildly decreased function. The left ventricle demonstrates global hypokinesis. There is mild left ventricular hypertrophy. Left ventricular diastolic parameters are consistent with Grade II diastolic dysfunction (pseudonormalization). Elevated left atrial  pressure.  2. Right ventricular systolic function is normal. The right ventricular size is normal. Tricuspid regurgitation signal is inadequate for assessing PA pressure.  3. Left atrial size was severely dilated.  4. Right atrial size was mildly dilated.  5. The mitral valve is abnormal. Mild mitral valve regurgitation. No evidence of mitral stenosis.  6. The tricuspid valve is abnormal.  7. The aortic valve has an indeterminant number of cusps. There is moderate calcification of the aortic valve. There is moderate thickening of the aortic valve. Aortic valve regurgitation is not visualized. Moderate aortic valve stenosis. Aortic valve mean

## 2022-11-17 NOTE — Progress Notes (Signed)
Patient more confused throughout the night. Very unsteady on his feet but wanted to get out of bed and walk around room. Not easily redirectable and become irate easily. PRN medication did not work for long.

## 2022-11-17 NOTE — Care Management Important Message (Signed)
Important Message  Patient Details  Name: Ronald [name] MRN: [id number] Date of Birth: [birth year]   Important Message Given:  Yes - Medicare IM (spoke with spouse [name] by telephone to review letter.)     Corey Harold 11/17/2022, 10:37 AM

## 2022-11-17 NOTE — Progress Notes (Signed)
Patient ID: Ronald [name], male   DOB: March 06, 1940, 82 y.o.   MRN: [id number] S:  No complaints this morning Due for dialysis today  O:BP (!) 178/78 (BP Location: Right Arm)   Pulse 80   Temp 97.7 F (36.5 C) (Oral)   Resp 16   Ht 6\' 2"  (1.88 m)   Wt 89.4 kg   SpO2 100%   BMI 25.31 kg/m   Intake/Output Summary (Last 24 hours) at 11/17/2022 1324 Last data filed at 11/17/2022 0100 Gross per 24 hour  Intake 240 ml  Output --  Net 240 ml   Intake/Output: I/O last 3 completed shifts: In: 240 [P.O.:240] Out: -   Intake/Output this shift:  No intake/output data recorded. Weight change:  Gen: Calmly sitting in chair CVS: Regular Resp: CTA Abd: +BS, soft, NT/ND Ext: no edema, LAVF +T/B  Recent Labs  Lab 11/12/22 1508 11/13/22 0313 11/14/22 0904 11/15/22 0533 11/16/22 0423 11/17/22 0338  NA 135 132* 134* 134* 132* 133*  K 3.7 4.0 3.6 3.5 3.8 4.0  CL 97* 95* 95* 95* 94* 95*  CO2 29 24 23 26 25 25   GLUCOSE 128* 135* 107* 90 86 90  BUN 19 27* 39* 31* 42* 53*  CREATININE 4.30* 5.19* 7.00* 5.60* 7.00* 8.22*  ALBUMIN  --   --  3.6 3.2* 3.5 3.3*  CALCIUM 8.3* 8.1* 8.6* 8.5* 9.0 8.9  PHOS  --   --  4.5 3.6 4.1 4.2   Liver Function Tests: Recent Labs  Lab 11/15/22 0533 11/16/22 0423 11/17/22 0338  ALBUMIN 3.2* 3.5 3.3*   No results for input(s): "LIPASE", "AMYLASE" in the last 168 hours. No results for input(s): "AMMONIA" in the last 168 hours. CBC: Recent Labs  Lab 11/13/22 0313 11/14/22 0522 11/15/22 0533 11/16/22 0423 11/17/22 0338  WBC 8.4 7.6 6.5 6.2 5.4  HGB 11.5* 11.0* 10.2* 11.0* 10.0*  HCT 35.9* 33.9* 31.5* 33.5* 31.6*  MCV 97.0 96.9 95.7 96.0 97.8  PLT 200 183 170 181 164   Cardiac Enzymes: No results for input(s): "CKTOTAL", "CKMB", "CKMBINDEX", "TROPONINI" in the last 168 hours. CBG: No results for input(s): "GLUCAP" in the last 168 hours.  Iron Studies: No results for input(s): "IRON", "TIBC", "TRANSFERRIN", "FERRITIN" in the last 72  hours. Studies/Results: No results found.  aspirin EC  81 mg Oral Daily   atorvastatin  80 mg Oral q AM   calcium acetate  1,334 mg Oral TID   carvedilol  25 mg Oral BID WC   Chlorhexidine Gluconate Cloth  6 each Topical Q0600   memantine  5 mg Oral BID   tamsulosin  0.4 mg Oral QPC supper   thiamine  100 mg Oral Daily   zolpidem  5 mg Oral QHS    BMET    Component Value Date/Time   NA 133 (L) 11/17/2022 0338   K 4.0 11/17/2022 0338   CL 95 (L) 11/17/2022 0338   CO2 25 11/17/2022 0338   GLUCOSE 90 11/17/2022 0338   BUN 53 (H) 11/17/2022 0338   CREATININE 8.22 (H) 11/17/2022 0338   CALCIUM 8.9 11/17/2022 0338   GFRNONAA 6 (L) 11/17/2022 0338   GFRAA 15 (L) 08/10/2019 1109   CBC    Component Value Date/Time   WBC 5.4 11/17/2022 0338   RBC 3.23 (L) 11/17/2022 0338   HGB 10.0 (L) 11/17/2022 0338   HCT 31.6 (L) 11/17/2022 0338   PLT 164 11/17/2022 0338   MCV 97.8 11/17/2022 0338   MCH 31.0 11/17/2022  0338   MCHC 31.6 11/17/2022 0338   RDW 14.9 11/17/2022 0338   LYMPHSABS 1.3 09/12/2015 1015   MONOABS 0.6 09/12/2015 1015   EOSABS 0.2 09/12/2015 1015   BASOSABS 0.0 09/12/2015 1015    Dialysis Orders: Center: [facility]  on MF . EDW 94kg HD [place] 3K/2.5Ca  Time 3:15 Heparin none. Access LAVF BFR 400 DFR 600    Calcitriol 0.5 mcg po/HD [place]  30 mg IV every 4 weeks  Venofer  50 mg IV qweek      Assessment/Plan:  Atrial fibrillation with RVR - currently rate controlled.  No anticoagulation because of high risk of bleeding/falls  ESRD -   HD today on schedule Hypertension/volume  -variable blood pressures.  He is currently below his edw and will need to be adjusted at time of discharge  Anemia  -  stable  Metabolic bone disease -   continue with home meds  Nutrition -  renal diet NSTEMI - Cardiology following.  Not a candidate for LHC per Cardiology.  Arita Miss, MD  [facility]

## 2022-11-17 NOTE — Progress Notes (Signed)
   HEMODIALYSIS TREATMENT NOTE:  3.25 h heparin-free treatment completed using left forearm AVF (15g/antegrade). Goal met: 1.1 liters removed.  High safety risk, A/O to self only. Pt attempted to get OOB three times.  Enid Derry, RN (SWOT) kindly kept pt distracted and remained within arms reach for the second half of pt's treatment -- many thanks!  All blood was returned and hemostasis was achieved in 12 minutes.  Post-HD:  11/17/22 1400  Vitals  BP (!) 138/55  MAP (mmHg) 79  BP Location Right Arm  BP Method Automatic  Patient Position (if appropriate) Sitting  Pulse Rate 69  Pulse Rate Source Monitor  Resp 18  Post Treatment  Dialyzer Clearance Lightly streaked  Hemodialysis Intake (mL) 0 mL  Liters Processed 72.1  Fluid Removed (mL) 1100 mL  Tolerated HD Treatment Yes  Post-Hemodialysis Comments Goal met  AVG/AVF Arterial Site Held (minutes) 7 minutes  AVG/AVF Venous Site Held (minutes) 7 minutes  Fistula / Graft Left Forearm Arteriovenous fistula  No placement date or time found.   Placed prior to admission: Yes  Orientation: Left  Access Location: Forearm  Access Type: Arteriovenous fistula  Fistula / Graft Assessment Thrill;Bruit  Status Patent     Arman Filter, RN [facility]

## 2022-11-17 NOTE — Progress Notes (Signed)
Patient removed IV and dressing over wound. Patient became irritated when I attempted to stop bleeding and redress. Currently have a bandage covering wound, will reinforce when patient is more calm.

## 2022-11-17 NOTE — Progress Notes (Signed)
Progress Note  Patient Name: Ronald [name] Date of Encounter: 11/17/2022  Primary Cardiologist: Charlton Haws, MD  Subjective   Confused, no symptoms.  Inpatient Medications    Scheduled Meds:  apixaban  2.5 mg Oral BID   aspirin EC  81 mg Oral Daily   atorvastatin  80 mg Oral q AM   calcium acetate  1,334 mg Oral TID   carvedilol  25 mg Oral BID WC   Chlorhexidine Gluconate Cloth  6 each Topical Q0600   memantine  5 mg Oral BID   spironolactone  12.5 mg Oral Daily   tamsulosin  0.4 mg Oral QPC supper   thiamine  100 mg Oral Daily   zolpidem  5 mg Oral QHS   Continuous Infusions:  PRN Meds: acetaminophen **OR** acetaminophen, diazepam, mouth rinse, polyethylene glycol   Vital Signs    Vitals:   11/16/22 1900 11/16/22 2204 11/17/22 0500 11/17/22 0506  BP: (!) 162/95 (!) 155/70  (!) 178/78  Pulse: 78 70  80  Resp:  18  16  Temp: 97.7 F (36.5 C) 97.6 F (36.4 C)  97.7 F (36.5 C)  TempSrc: Oral Oral  Oral  SpO2: 93% 98%  100%  Weight:   89.4 kg   Height:        Intake/Output Summary (Last 24 hours) at 11/17/2022 0855 Last data filed at 11/17/2022 0100 Gross per 24 hour  Intake 240 ml  Output --  Net 240 ml   Filed Weights   11/15/22 0456 11/16/22 0705 11/17/22 0500  Weight: 89.1 kg 91.6 kg 89.4 kg    Telemetry    Not on Telemetry  ECG  Not performed today   Physical Exam   GEN: No acute distress.   Neck: No JVD. Cardiac: RRR, no murmur, rub, or gallop.  Respiratory: Nonlabored. Clear to auscultation bilaterally. GI: Soft, nontender, bowel sounds present. MS: No edema; No deformity. Neuro:  Nonfocal. Psych: Alert and oriented x 3. Normal affect.  Labs    Chemistry Recent Labs  Lab 11/15/22 0533 11/16/22 0423 11/17/22 0338  NA 134* 132* 133*  K 3.5 3.8 4.0  CL 95* 94* 95*  CO2 26 25 25   GLUCOSE 90 86 90  BUN 31* 42* 53*  CREATININE 5.60* 7.00* 8.22*  CALCIUM 8.5* 9.0 8.9  ALBUMIN 3.2* 3.5 3.3*  GFRNONAA 10* 7* 6*   ANIONGAP 13 13 13      Hematology Recent Labs  Lab 11/15/22 0533 11/16/22 0423 11/17/22 0338  WBC 6.5 6.2 5.4  RBC 3.29* 3.49* 3.23*  HGB 10.2* 11.0* 10.0*  HCT 31.5* 33.5* 31.6*  MCV 95.7 96.0 97.8  MCH 31.0 31.5 31.0  MCHC 32.4 32.8 31.6  RDW 15.1 15.1 14.9  PLT 170 181 164    Cardiac Enzymes Recent Labs  Lab 11/07/22 1418 11/07/22 1540 11/12/22 1508 11/12/22 1713 11/12/22 2143  TROPONINIHS 50* 45* 1,125* 2,448* 9,187*    BNP Recent Labs  Lab 11/12/22 1508  BNP 1,510.0*     DDimerNo results for input(s): "DDIMER" in the last 168 hours.   Radiology    No results found.   Assessment & Plan   NSTEMI (Hs troponins 1125>>2448>>9187) Acute systolic HF, compensated New onset CDM LVEF 40-45% Paroxysmal AFib CAD s/p CABG in 1999 AAA s/p EVAR in 2013 with progressive inc in native aneurysmal sac to 10cm, no endoleak ESRD DD Severe dementia   -Remains confused this morning. No symptoms.  Not on telemetry.  HR controlled on vitals. Not  a candidate for LHC due to severe dementia.  Wife also preferred medical management.  Continue aspirin 81 mg once daily, carvedilol 25 mg twice daily.  Spironolactone use per nephrology. Will discontinue Eliquis 2.5 mg twice daily due to severe dementia, confusion and possible fall risk. -Wife preferred no surgical intervention on the AAA repair.  CODE STATUS: DNR   [facility] will sign off.   Medication Recommendations: Carvedilol 25 mg twice daily, Eliquis 2.5 mg twice daily Other recommendations (labs, testing, etc): None Follow up as an outpatient: Follow-up with cardiology in 1 month     Signed, Nnaemeka Samson P Hannie Shoe, MD  11/17/2022, 8:55 AM

## 2022-11-21 ENCOUNTER — Telehealth: Payer: Self-pay | Admitting: *Deleted

## 2022-11-21 ENCOUNTER — Telehealth: Payer: Self-pay

## 2022-11-21 DIAGNOSIS — Z992 Dependence on renal dialysis: Secondary | ICD-10-CM | POA: Diagnosis not present

## 2022-11-21 DIAGNOSIS — N186 End stage renal disease: Secondary | ICD-10-CM | POA: Diagnosis not present

## 2022-11-21 DIAGNOSIS — I214 Non-ST elevation (NSTEMI) myocardial infarction: Secondary | ICD-10-CM

## 2022-11-21 NOTE — Telephone Encounter (Signed)
Incoming call received from [facility] Coordinator stating patient in need for urgent transportation to dialysis clinic. SW at center shared she is trying to get Ronald [name] access but probably another week before a decision is made if he is approved or not.  Transportation was secured at last minute for Friday's treatment, but patient would need assistance for Monday and Wednesday. Referral to [facility] Care Management for recent hospitalized due to heart attack. Pending request for transportation as of 11/21/22.  Baruch Gouty Renal Coordinator [facility] [phone]

## 2022-11-21 NOTE — Progress Notes (Signed)
Care Coordination   Note   11/21/2022 Name: Ronald [name] MRN: [id number] DOB: 02-23-1940  Ronald [name] is a 82 y.o. year old male who sees [name], [name], MD for primary care. I reached out to Conan Bowens by phone today to offer care coordination services.  [name] was given information about Care Coordination services today including:   The Care Coordination services include support from the care team which includes your Nurse Coordinator, Clinical Social Worker, or Pharmacist.  The Care Coordination team is here to help remove barriers to the health concerns and goals most important to you. Care Coordination services are voluntary, and the patient may decline or stop services at any time by request to their care team member.   Care Coordination Consent Status: Patient agreed to services and verbal consent obtained.   Follow up plan:  Telephone appointment with care coordination team member scheduled for:  10/21  Encounter Outcome:  Patient Scheduled  [facility] Coordination Care Guide  Direct Dial: [phone]

## 2022-11-24 DIAGNOSIS — N186 End stage renal disease: Secondary | ICD-10-CM | POA: Diagnosis not present

## 2022-11-24 DIAGNOSIS — Z992 Dependence on renal dialysis: Secondary | ICD-10-CM | POA: Diagnosis not present

## 2022-11-25 ENCOUNTER — Other Ambulatory Visit: Payer: Self-pay

## 2022-11-26 ENCOUNTER — Other Ambulatory Visit: Payer: Self-pay

## 2022-11-26 ENCOUNTER — Telehealth: Payer: Self-pay | Admitting: *Deleted

## 2022-11-26 DIAGNOSIS — C61 Malignant neoplasm of prostate: Secondary | ICD-10-CM

## 2022-11-26 NOTE — Telephone Encounter (Signed)
Telephone encounter was:  Successful.  11/26/2022 Name: Ronald [name] MRN: [id number] DOB: Jul 10, 1940  Ronald [name] is a 82 y.o. year old male who is a primary care patient of [name], [name], MD . The community resource team was consulted for assistance with Transportation Needs  Look like patient has  Medicaid but according to the wife  its limited and yet he has the U card in his chart he has U[employer] dual complete which means we just need to establish him with Medicaid transportation, and they should be able to schedule the rides through [facility] .  Care guide performed the following interventions: Follow up call placed to the patient to discuss status of referral.  Follow Up Plan:  Care guide will outreach resources to assist patient with transportation   Dione Booze [facility]  Population Health Careguide  Direct Dial: [phone] Website: [email or web address]

## 2022-11-27 ENCOUNTER — Telehealth: Payer: Self-pay | Admitting: *Deleted

## 2022-11-27 DIAGNOSIS — I214 Non-ST elevation (NSTEMI) myocardial infarction: Secondary | ICD-10-CM | POA: Diagnosis not present

## 2022-11-27 DIAGNOSIS — F17210 Nicotine dependence, cigarettes, uncomplicated: Secondary | ICD-10-CM | POA: Diagnosis not present

## 2022-11-27 DIAGNOSIS — J441 Chronic obstructive pulmonary disease with (acute) exacerbation: Secondary | ICD-10-CM | POA: Insufficient documentation

## 2022-11-27 DIAGNOSIS — J449 Chronic obstructive pulmonary disease, unspecified: Secondary | ICD-10-CM | POA: Insufficient documentation

## 2022-11-27 DIAGNOSIS — I509 Heart failure, unspecified: Secondary | ICD-10-CM | POA: Diagnosis not present

## 2022-11-27 DIAGNOSIS — N186 End stage renal disease: Secondary | ICD-10-CM | POA: Diagnosis not present

## 2022-11-27 DIAGNOSIS — I132 Hypertensive heart and chronic kidney disease with heart failure and with stage 5 chronic kidney disease, or end stage renal disease: Secondary | ICD-10-CM | POA: Diagnosis not present

## 2022-11-27 DIAGNOSIS — Z992 Dependence on renal dialysis: Secondary | ICD-10-CM | POA: Diagnosis not present

## 2022-11-27 DIAGNOSIS — I2510 Atherosclerotic heart disease of native coronary artery without angina pectoris: Secondary | ICD-10-CM | POA: Diagnosis not present

## 2022-11-27 DIAGNOSIS — D631 Anemia in chronic kidney disease: Secondary | ICD-10-CM | POA: Diagnosis not present

## 2022-11-27 DIAGNOSIS — I480 Paroxysmal atrial fibrillation: Secondary | ICD-10-CM | POA: Diagnosis not present

## 2022-11-27 DIAGNOSIS — E782 Mixed hyperlipidemia: Secondary | ICD-10-CM | POA: Diagnosis not present

## 2022-11-27 NOTE — Telephone Encounter (Signed)
Telephone encounter was:  Successful.  11/27/2022 Name: Ronald [name] MRN: [id number] DOB: 1940-11-19  Ronald [name] is a 82 y.o. year old male who is a primary care patient of [name], [name], MD . The community resource team was consulted for assistance with Transportation Needs   Care guide performed the following interventions: Patient provided with information about care guide support team and interviewed to confirm resource needs. According to [employer]  that I finally reached this am they will not transport for Medicaid as he has the [facility] plan . I have reached out to the wife but need to call her back and also left a message for the FA . At this time, he has used his allotted rides for this plan year . Also, will inquire if there is a way to appeal more rides if there are not any available.  Either way the Friday chair time will not be able to be scheduled via [facility] or a community resource , I will ask the wife again, but they have been leaning on a elderly neighbor to take him not sure if that will be able to take place .  According to DSS the only way now since the recent changes is the only ride availability comes from there direct plan . I wonder though as its open enrollment if might be warranted for him to switch to direct Medicaid, The wife and I reached out to his Medicaid worker who is Ms Donnalee Curry at ext [phone] at [facility] , we had to leave a message for her to reach back out to patient. I advised wife that it is unlikely anybody will be able to transport in time for Friday chair at 530 am . Patients' wife understands and has care guide number to reach back out if she has any issues .    Will follow up with patient later in the day she did find them a ride to PCP appointment today at 11 am  Follow Up Plan:   later today  Dione Booze [facility] Health  Population Health Careguide  Direct Dial: [phone] Website: [email or web address]

## 2022-11-27 NOTE — Telephone Encounter (Signed)
Telephone encounter was:  Successful.  11/27/2022 Name: Ronald [name] MRN: [id number] DOB: 1940-04-26  Ronald [name] is a 82 y.o. year old male who is a primary care patient of [name], [name], MD . The community resource team was consulted for assistance with Transportation Needs  patient wife has called to say she has not heard a back from the [facility] worker advised here we would call tomorrow and try again to reach her  Care guide performed the following interventions: Patient provided with information about care guide support team and interviewed to confirm resource needs.  Follow Up Plan:   Will call tomorrow   Dione Booze [facility] Health  Population Health Careguide  Direct Dial: [phone] Website: [email or web address]

## 2022-11-28 ENCOUNTER — Telehealth: Payer: Self-pay | Admitting: *Deleted

## 2022-11-28 NOTE — Telephone Encounter (Signed)
Telephone encounter was:  Successful.  11/28/2022 Name: Ronald [name] MRN: [id number] DOB: Aug 17, 1940  Ronald [name] is a 82 y.o. year old male who is a primary care patient of [name], [name], MD . The community resource team was consulted for assistance with Transportation Needs  Called DSS about patient transportation reached out to [employer] worker MS victory with patient wife again left a message have not heard back from Fa from the center will leave another message for her  Care guide performed the following interventions: Follow up call placed to the patient to discuss status of referral.  Follow Up Plan:   Will reach out on Monday if nothing else occurs before then  Dione Booze [facility] Health  Population Health Careguide  Direct Dial: [phone] Website: [email or web address]

## 2022-11-29 DIAGNOSIS — N186 End stage renal disease: Secondary | ICD-10-CM | POA: Diagnosis not present

## 2022-11-29 DIAGNOSIS — Z992 Dependence on renal dialysis: Secondary | ICD-10-CM | POA: Diagnosis not present

## 2022-12-01 ENCOUNTER — Ambulatory Visit: Payer: 59 | Admitting: Cardiovascular Disease

## 2022-12-01 ENCOUNTER — Ambulatory Visit: Payer: Self-pay | Admitting: *Deleted

## 2022-12-01 DIAGNOSIS — N186 End stage renal disease: Secondary | ICD-10-CM | POA: Diagnosis not present

## 2022-12-01 DIAGNOSIS — Z992 Dependence on renal dialysis: Secondary | ICD-10-CM | POA: Diagnosis not present

## 2022-12-01 NOTE — Patient Instructions (Signed)
Visit Information  Thank you for taking time to visit with me today. Please don't hesitate to contact me if I can be of assistance to you.   Following are the goals we discussed today:   Goals Addressed   None     Our next appointment is by telephone on 01/01/23 at 1 pm  Please call the care guide team at [phone] if you need to cancel or reschedule your appointment.   If you are experiencing a Mental Health or Behavioral Health Crisis or need someone to talk to, please call the Suicide and Crisis Lifeline: 988 call the [place] National Suicide Prevention Lifeline: 367 820 3400 or TTY: 724 878 8279 TTY 562-305-5087) to talk to a trained counselor call 1-800-273-TALK (toll free, 24 hour hotline) call the [facility]: 520-840-1539 call 911    No computer access, no preference for copy of AVS      The patient has been provided with contact information for the care management team and has been advised to call with any health related questions or concerns.   Henslee Lottman L. Noelle Penner, RN, BSN, [facility]  VBCI Care Management Coordinator  [phone]  Fax: [phone]

## 2022-12-01 NOTE — Patient Outreach (Signed)
Care Coordination   Initial Visit Note   12/03/2022 updated entry for 12/01/22 Name: Ronald [name] MRN: [id number] DOB: 1940-05-15  Ronald [name] is a 82 y.o. year old male who sees [name], [name], MD for primary care. I spoke with  [name] by phone today.  What matters to the patients health and wellness today?  Increase edema related to congestive Heart Failure (CHF)  Chronic Kidney disease (CKD)- swelling only goes  Dr Wolfgang [place] okay with him dropping to Mondays & Friday Has compression socks but does not like wearing compression Raise (elevate) the injured area above the level of your heart while you are sitting or lying down. Do not sit still or stand for long periods of time. Do not wear tight clothing. Do not wear garters on your upper legs. Exercise your legs to get your circulation going. This helps to move the fluid back into your blood vessels, and it may help the swelling go down. Wear compression stockings as told by your health care provider. These stockings help to prevent blood clots and reduce swelling in your legs. It is important that these are the correct size. These stockings should be prescribed by your health care provider to prevent possible injuries. If elastic bandages or wraps are recommended, use them as told by your health care provider.     Goals Addressed             This Visit's Progress    Decrease leg swelling - care coordination services       Interventions Today    Flowsheet Row Most Recent Value  Chronic Disease   Chronic disease during today's visit Chronic Kidney Disease/End Stage Renal Disease (ESRD), Other  [swollen legs]  General Interventions   General Interventions Discussed/Reviewed General Interventions Discussed, Doctor Visits  Doctor Visits Discussed/Reviewed Doctor Visits Discussed  Exercise Interventions   Exercise Discussed/Reviewed Exercise Discussed, Weight Managment  [discussed weighing daily,  monitoring for gain of 3-5 pounds to use as needed diuretic]  Weight Management Weight maintenance  Education Interventions   Education Provided Provided Printed Education, Provided Education  [Ace wrap application, diuretic, compression hose U card Over the counter resources]  Provided Verbal Education On Development worker, community, [employer], Medication  Mental Health Interventions   Mental Health Discussed/Reviewed Mental Health Discussed, Coping Strategies  Nutrition Interventions   Nutrition Discussed/Reviewed Nutrition Discussed, Decreasing salt  Pharmacy Interventions   Pharmacy Dicussed/Reviewed Pharmacy Topics Discussed, Medications and their functions, Affording Medications              SDOH assessments and interventions completed:  Yes     Care Coordination Interventions:  Yes, provided   Follow up plan: Follow up call scheduled for 01/01/23    Encounter Outcome:  Patient Visit Completed   Cala Bradford L. Noelle Penner, RN, BSN, [facility]  VBCI Care Management Coordinator  [phone]  Fax: [phone]

## 2022-12-02 ENCOUNTER — Telehealth: Payer: Self-pay | Admitting: *Deleted

## 2022-12-02 NOTE — Telephone Encounter (Signed)
Telephone encounter was:  Successful.  12/02/2022 Name: Ghazi Bausman Butson MRN: [id number] DOB: 11-17-1940  Dedrick Avera Skalski is a 82 y.o. year old male who is a primary care patient of [name], [name], MD . The community resource team was consulted for assistance with Transportation Needs   Care guide performed the following interventions: Patient provided with information about care guide support team and interviewed to confirm resource needs. Patient will speak with her husband about [facility] I explained and provided her web information and phone number , offered a referral and she declined as she said she would have to make sure it was what her husband wanted offered caregiver resources for possible respite but patient declined as husband does not like her to leave and so she is happy to do this . Wife will call [facility] if she wants to enroll and has Edd Arbour phone # and was clear about wanting to do it herself . Asked her if there where any additional needs and she said not at this time  Follow Up Plan:  No further follow up planned at this time. The patient has been provided with needed resources. Dione Booze [facility] Health  Population Health Careguide  Direct Dial: [phone] Website: [email or web address]

## 2022-12-03 ENCOUNTER — Ambulatory Visit: Payer: Self-pay | Admitting: *Deleted

## 2022-12-03 NOTE — Patient Instructions (Signed)
Visit Information  Thank you for taking time to visit with me today. Please don't hesitate to contact me if I can be of assistance to you.   Following are the goals we discussed today:   Goals Addressed             This Visit's Progress    Decrease leg swelling - care coordination services       Interventions Today    Flowsheet Row Most Recent Value  Chronic Disease   Chronic disease during today's visit Chronic Kidney Disease/End Stage Renal Disease (ESRD)  General Interventions   General Interventions Discussed/Reviewed Communication with, General Interventions Reviewed  Communication with PCP/Specialists  [left message at Dr Wolfgang [place] office per his request related to patient & wife inquiry about an as needed diuretic for swellling of patient legs now that he only goes to dialysis 2x a week Left wife & RN CM numbers for a return call]              Our next appointment is by telephone on 01/01/23 at 1 pm  Please call the care guide team at [phone] if you need to cancel or reschedule your appointment.   If you are experiencing a Mental Health or Behavioral Health Crisis or need someone to talk to, please call the Suicide and Crisis Lifeline: 988 call the [place] National Suicide Prevention Lifeline: 470-481-7098 or TTY: 972-635-2894 TTY (404)732-4369) to talk to a trained counselor call 1-800-273-TALK (toll free, 24 hour hotline) call the [facility]: 340-026-1369 call 911   No computer access, no preference for copy of AVS      The patient has been provided with contact information for the care management team and has been advised to call with any health related questions or concerns.   Blonnie Maske L. Noelle Penner, RN, BSN, [facility]  VBCI Care Management Coordinator  [phone]  Fax: [phone]

## 2022-12-03 NOTE — Patient Outreach (Signed)
Care Coordination   Collaboration with nephrology  Visit Note   12/03/2022 Name: Ronald [name] MRN: [id number] DOB: Jun 18, 1940  Ronald [name] is a 82 y.o. year old male who sees Ronald [name], Ronald Hazel, MD for primary care. I  received a response from Ronald [name] with a request for RN CM to leave a message at the office  What matters to the patients health and wellness today?  Inquiry for an as needed diuretic for leg swelling    Goals Addressed             This Visit's Progress    Decrease leg swelling - care coordination services       Interventions Today    Flowsheet Row Most Recent Value  Chronic Disease   Chronic disease during today's visit Chronic Kidney Disease/End Stage Renal Disease (ESRD)  General Interventions   General Interventions Discussed/Reviewed Communication with, General Interventions Reviewed  Communication with PCP/Specialists  [left message at Ronald [name] office per his request related to patient & wife inquiry about an as needed diuretic for swellling of patient legs now that he only goes to dialysis 2x a week Left wife & RN CM numbers for a return call]              SDOH assessments and interventions completed:  No     Care Coordination Interventions:  Yes, provided   Follow up plan: Follow up call scheduled for 01/01/23    Encounter Outcome:  Patient Visit Completed   Cala Bradford L. Noelle Penner, RN, BSN, [facility]  VBCI Care Management Coordinator  [phone]  Fax: [phone]

## 2022-12-04 ENCOUNTER — Ambulatory Visit: Payer: Self-pay | Admitting: *Deleted

## 2022-12-04 NOTE — Patient Outreach (Signed)
Care Coordination   Collaboration with Dialysis  Visit Note   12/04/2022 Name: Ronald [name] MRN: [id number] DOB: 07/12/1940  Ronald [name] is a 82 y.o. year old male who sees [name], [name], MD for primary care. I  outreached to [employer] then [employer] with [place], AA to leave a message for Dr Doristine Church Nurse   What matters to the patients health and wellness today?  As needed Diuretic     Goals Addressed             This Visit's Progress    Decrease leg swelling - care coordination services       Patient will manage edema/swelling at home   Interventions Today    Flowsheet Row Most Recent Value  Chronic Disease   Chronic disease during today's visit Chronic Kidney Disease/End Stage Renal Disease (ESRD), Other  [as need diuretic for edema home management]  General Interventions   General Interventions Discussed/Reviewed General Interventions Reviewed, Communication with  Communication with PCP/Specialists  [Left a message for the dialysis nurse of Dr Earleen Reaper at [facility] [phone] to return a call to RN CM about possible diuretic for home management of edema Spoke with Tamela Oddi the AA]            Interventions Today    Flowsheet Row Most Recent Value  Chronic Disease   Chronic disease during today's visit Chronic Kidney Disease/End Stage Renal Disease (ESRD), Other  [swollen legs]  General Interventions   General Interventions Discussed/Reviewed General Interventions Discussed, Doctor Visits  Doctor Visits Discussed/Reviewed Doctor Visits Discussed  Exercise Interventions   Exercise Discussed/Reviewed Exercise Discussed, Weight Managment  [discussed weighing daily, monitoring for gain of 3-5 pounds to use as needed diuretic]  Weight Management Weight maintenance  Education Interventions   Education Provided Provided Printed Education, Provided Education  [Ace wrap application, diuretic, compression hose U card  Over the counter resources]  Provided Verbal Education On Development worker, community, [employer], Medication  Mental Health Interventions   Mental Health Discussed/Reviewed Mental Health Discussed, Coping Strategies  Nutrition Interventions   Nutrition Discussed/Reviewed Nutrition Discussed, Decreasing salt  Pharmacy Interventions   Pharmacy Dicussed/Reviewed Pharmacy Topics Discussed, Medications and their functions, Affording Medications              SDOH assessments and interventions completed:  No     Care Coordination Interventions:  Yes, provided   Follow up plan: Follow up call scheduled for 01/01/23 1 pm     Encounter Outcome:  Patient Visit Completed   Chiyo Fay L. Noelle Penner, RN, BSN, [facility]  VBCI Care Management Coordinator  [phone]  Fax: [phone]

## 2022-12-04 NOTE — Patient Instructions (Signed)
Visit Information  Thank you for taking time to visit with me today. Please don't hesitate to contact me if I can be of assistance to you.   Following are the goals we discussed today:   Goals Addressed             This Visit's Progress    Decrease leg swelling - care coordination services       Patient will manage edema/swelling at home   Interventions Today    Flowsheet Row Most Recent Value  Chronic Disease   Chronic disease during today's visit Chronic Kidney Disease/End Stage Renal Disease (ESRD), Other  [as need diuretic for edema home management]  General Interventions   General Interventions Discussed/Reviewed General Interventions Reviewed, Communication with  Communication with PCP/Specialists  [Left a message for the dialysis nurse of Dr Earleen Reaper at [facility] [phone] to return a call to RN CM about possible diuretic for home management of edema Spoke with Tamela Oddi the AA]            Interventions Today    Flowsheet Row Most Recent Value  Chronic Disease   Chronic disease during today's visit Chronic Kidney Disease/End Stage Renal Disease (ESRD), Other  [swollen legs]  General Interventions   General Interventions Discussed/Reviewed General Interventions Discussed, Doctor Visits  Doctor Visits Discussed/Reviewed Doctor Visits Discussed  Exercise Interventions   Exercise Discussed/Reviewed Exercise Discussed, Weight Managment  [discussed weighing daily, monitoring for gain of 3-5 pounds to use as needed diuretic]  Weight Management Weight maintenance  Education Interventions   Education Provided Provided Printed Education, Provided Education  [Ace wrap application, diuretic, compression hose U card Over the counter resources]  Provided Verbal Education On Development worker, community, [employer], Medication  Mental Health Interventions   Mental Health Discussed/Reviewed Mental Health Discussed, Coping Strategies  Nutrition Interventions   Nutrition  Discussed/Reviewed Nutrition Discussed, Decreasing salt  Pharmacy Interventions   Pharmacy Dicussed/Reviewed Pharmacy Topics Discussed, Medications and their functions, Affording Medications              Our next appointment is by telephone on 01/01/23 at 1 pm   Please call the care guide team at [phone] if you need to cancel or reschedule your appointment.   If you are experiencing a Mental Health or Behavioral Health Crisis or need someone to talk to, please call the Suicide and Crisis Lifeline: 988 call the [place] National Suicide Prevention Lifeline: (512)745-3906 or TTY: 249-206-0769 TTY 910-018-4626) to talk to a trained counselor call 1-800-273-TALK (toll free, 24 hour hotline) call the [facility]: [phone] call 911   No computer access, no preference for copy of AVS      The patient has been provided with contact information for the care management team and has been advised to call with any health related questions or concerns.   Menachem Urbanek L. Noelle Penner, RN, BSN, [facility]  VBCI Care Management Coordinator  [phone]  Fax: [phone]

## 2022-12-05 DIAGNOSIS — Z992 Dependence on renal dialysis: Secondary | ICD-10-CM | POA: Diagnosis not present

## 2022-12-05 DIAGNOSIS — N186 End stage renal disease: Secondary | ICD-10-CM | POA: Diagnosis not present

## 2022-12-08 DIAGNOSIS — N186 End stage renal disease: Secondary | ICD-10-CM | POA: Diagnosis not present

## 2022-12-08 DIAGNOSIS — Z992 Dependence on renal dialysis: Secondary | ICD-10-CM | POA: Diagnosis not present

## 2022-12-10 ENCOUNTER — Ambulatory Visit (HOSPITAL_COMMUNITY)
Admission: RE | Admit: 2022-12-10 | Discharge: 2022-12-10 | Disposition: A | Discharge status: Home / Self Care | Payer: 59 | Source: Ambulatory Visit | Attending: Urology | Admitting: Urology

## 2022-12-10 DIAGNOSIS — C61 Malignant neoplasm of prostate: Secondary | ICD-10-CM | POA: Insufficient documentation

## 2022-12-10 MED ORDER — TECHNETIUM TC 99M MEDRONATE IV KIT
20.0000 | PACK | Freq: Once | INTRAVENOUS | Status: AC | PRN
  Administered 2022-12-10: 20.8 via INTRAVENOUS

## 2022-12-11 DIAGNOSIS — Z992 Dependence on renal dialysis: Secondary | ICD-10-CM | POA: Diagnosis not present

## 2022-12-11 DIAGNOSIS — N186 End stage renal disease: Secondary | ICD-10-CM | POA: Diagnosis not present

## 2022-12-12 DIAGNOSIS — N186 End stage renal disease: Secondary | ICD-10-CM | POA: Diagnosis not present

## 2022-12-12 DIAGNOSIS — Z992 Dependence on renal dialysis: Secondary | ICD-10-CM | POA: Diagnosis not present

## 2022-12-15 DIAGNOSIS — N186 End stage renal disease: Secondary | ICD-10-CM | POA: Diagnosis not present

## 2022-12-15 DIAGNOSIS — Z992 Dependence on renal dialysis: Secondary | ICD-10-CM | POA: Diagnosis not present

## 2022-12-16 ENCOUNTER — Ambulatory Visit (HOSPITAL_COMMUNITY)
Admission: RE | Admit: 2022-12-16 | Discharge: 2022-12-16 | Disposition: A | Discharge status: Home / Self Care | Payer: 59 | Source: Ambulatory Visit | Attending: Urology | Admitting: Urology

## 2022-12-16 DIAGNOSIS — N281 Cyst of kidney, acquired: Secondary | ICD-10-CM | POA: Diagnosis not present

## 2022-12-16 DIAGNOSIS — I714 Abdominal aortic aneurysm, without rupture, unspecified: Secondary | ICD-10-CM | POA: Diagnosis not present

## 2022-12-16 DIAGNOSIS — C61 Malignant neoplasm of prostate: Secondary | ICD-10-CM | POA: Diagnosis present

## 2022-12-16 MED ORDER — IOHEXOL 300 MG/ML  SOLN
100.0000 mL | Freq: Once | INTRAMUSCULAR | Status: AC | PRN
  Administered 2022-12-16: 100 mL via INTRAVENOUS

## 2022-12-18 DIAGNOSIS — K219 Gastro-esophageal reflux disease without esophagitis: Secondary | ICD-10-CM | POA: Diagnosis not present

## 2022-12-18 DIAGNOSIS — R600 Localized edema: Secondary | ICD-10-CM | POA: Diagnosis not present

## 2022-12-18 DIAGNOSIS — I10 Essential (primary) hypertension: Secondary | ICD-10-CM | POA: Diagnosis not present

## 2022-12-18 DIAGNOSIS — J449 Chronic obstructive pulmonary disease, unspecified: Secondary | ICD-10-CM | POA: Diagnosis not present

## 2022-12-18 DIAGNOSIS — F17210 Nicotine dependence, cigarettes, uncomplicated: Secondary | ICD-10-CM | POA: Diagnosis not present

## 2022-12-18 DIAGNOSIS — Z713 Dietary counseling and surveillance: Secondary | ICD-10-CM | POA: Diagnosis not present

## 2022-12-19 DIAGNOSIS — Z992 Dependence on renal dialysis: Secondary | ICD-10-CM | POA: Diagnosis not present

## 2022-12-19 DIAGNOSIS — N186 End stage renal disease: Secondary | ICD-10-CM | POA: Diagnosis not present

## 2022-12-22 DIAGNOSIS — N186 End stage renal disease: Secondary | ICD-10-CM | POA: Diagnosis not present

## 2022-12-22 DIAGNOSIS — Z992 Dependence on renal dialysis: Secondary | ICD-10-CM | POA: Diagnosis not present

## 2022-12-24 ENCOUNTER — Other Ambulatory Visit: Payer: Self-pay | Admitting: Physician Assistant

## 2022-12-24 ENCOUNTER — Ambulatory Visit: Payer: 59 | Admitting: Urology

## 2022-12-24 VITALS — BP 146/66 | HR 65

## 2022-12-24 DIAGNOSIS — C61 Malignant neoplasm of prostate: Secondary | ICD-10-CM

## 2022-12-24 DIAGNOSIS — N401 Enlarged prostate with lower urinary tract symptoms: Secondary | ICD-10-CM | POA: Diagnosis not present

## 2022-12-24 DIAGNOSIS — N138 Other obstructive and reflux uropathy: Secondary | ICD-10-CM

## 2022-12-24 DIAGNOSIS — R3912 Poor urinary stream: Secondary | ICD-10-CM

## 2022-12-24 MED ORDER — TAMSULOSIN HCL 0.4 MG PO CAPS: 0.4000 mg | ORAL_CAPSULE | Freq: Every day | ORAL | 3 refills | Status: AC

## 2022-12-24 NOTE — Progress Notes (Signed)
12/24/2022 10:28 AM   Elenore Rota Grissinger 1940-09-06 [id number]  Referring provider: [name], MD [address] [place],  [place] [address]  Followup prostate cancer   HPI: Mr Cao is a 82yo here for followup for prostate cancer and BPH. IPSS 13 QOL 2 on flomax 0.4mg  daily. Uirne stream is improved on flomax. No straining to urinate. Nocturia 1-2x. CT and bone scan showed no evidence of metastatic disease.    PMH: Past Medical History:  Diagnosis Date   GERD (gastroesophageal reflux disease)    Glaucoma    Hypertension    Macular degeneration    Myocardial infarction [facility])    1999   Prostate cancer (HCC) 02/2018   Renal insufficiency    Stroke [facility])    no deficits; had stroke while trying to place "stents in legs".    Surgical History: Past Surgical History:  Procedure Laterality Date   ABDOMINAL AORTIC ANEURYSM REPAIR  2012   IN [place]   APPENDECTOMY     AV FISTULA PLACEMENT Left 10/16/2020   Procedure: LEFT ARM ARTERIOVENOUS (AV) FISTULA CREATION;  Surgeon: Larina Earthly, MD;  Location: [facility];  Service: Vascular;  Laterality: Left;   CORONARY ARTERY BYPASS GRAFT  01/1998   3 vessels 18 years ago   INGUINAL HERNIA REPAIR Right 09/20/2015   Procedure: REPAIR RIGHT INGUINAL HERNIA  WITH MESH;  Surgeon: Ancil Linsey, MD;  Location: [facility];  Service: General;  Laterality: Right;   INSERTION OF DIALYSIS CATHETER Right 08/02/2020   Procedure: INSERTION OF RIGHT TUNNELED INTERNAL JUGULAR  DIALYSIS CATHETER;  Surgeon: Larina Earthly, MD;  Location: [facility];  Service: Vascular;  Laterality: Right;   REMOVAL OF A DIALYSIS CATHETER N/A 03/19/2021   Procedure: MINOR REMOVAL OF A TUNNELED DIALYSIS CATHETER;  Surgeon: Larina Earthly, MD;  Location: [facility];  Service: Vascular;  Laterality: N/A;   tumor removal from bladder      Home Medications:  Allergies as of 12/24/2022   No Known Allergies      Medication List        Accurate as of December 24, 2022  10:28 AM. If you have any questions, ask your nurse or doctor.          STOP taking these medications    memantine 5 MG tablet Commonly known as: NAMENDA       TAKE these medications    aspirin EC 81 MG tablet Take 1 tablet (81 mg total) by mouth daily. Swallow whole.   atorvastatin 80 MG tablet Commonly known as: LIPITOR Take 80 mg by mouth in the morning.   calcium acetate 667 MG capsule Commonly known as: PHOSLO Take 1,334 mg by mouth 3 (three) times daily. citracal   carvedilol 25 MG tablet Commonly known as: COREG Take 1 tablet (25 mg total) by mouth 2 (two) times daily with a meal.   Magnesium 250 MG Tabs Take 1 tablet by mouth at bedtime.   tamsulosin 0.4 MG Caps capsule Commonly known as: FLOMAX Take 1 capsule (0.4 mg total) by mouth daily after supper.   traMADol 50 MG tablet Commonly known as: ULTRAM Take 50 mg by mouth 3 (three) times daily as needed (pain.).   zolpidem 10 MG tablet Commonly known as: AMBIEN Take 10 mg by mouth at bedtime.        Allergies: No Known Allergies  Family History: Family History  Problem Relation Age of Onset   Cancer Father  Social History:  reports that he has quit smoking. His smoking use included cigarettes. He started smoking about 65 years ago. He has a 32.7 pack-year smoking history. He has never used smokeless tobacco. He reports that he does not drink alcohol and does not use drugs.  ROS: All other review of systems were reviewed and are negative except what is noted above in HPI  Physical Exam: BP (!) 146/66   Pulse 65   Constitutional:  Alert and oriented, No acute distress. HEENT:  AT, moist mucus membranes.  Trachea midline, no masses. Cardiovascular: No clubbing, cyanosis, or edema. Respiratory: Normal respiratory effort, no increased work of breathing. GI: Abdomen is soft, nontender, nondistended, no abdominal masses GU: No CVA tenderness.  Lymph: No cervical or inguinal  lymphadenopathy. Skin: No rashes, bruises or suspicious lesions. Neurologic: Grossly intact, no focal deficits, moving all 4 extremities. Psychiatric: Normal mood and affect.  Laboratory Data: Lab Results  Component Value Date   WBC 5.4 11/17/2022   HGB 10.0 (L) 11/17/2022   HCT 31.6 (L) 11/17/2022   MCV 97.8 11/17/2022   PLT 164 11/17/2022    Lab Results  Component Value Date   CREATININE 8.22 (H) 11/17/2022    Lab Results  Component Value Date   PSA <0.1 03/15/2019    Lab Results  Component Value Date   TESTOSTERONE 124 (L) 03/26/2020    No results found for: "HGBA1C"  Urinalysis    Component Value Date/Time   APPEARANCEUR Clear 10/29/2022 0906   GLUCOSEU Trace (A) 10/29/2022 0906   BILIRUBINUR Negative 10/29/2022 0906   PROTEINUR 3+ (A) 10/29/2022 0906   UROBILINOGEN 0.2 05/17/2019 0917   NITRITE Negative 10/29/2022 0906   LEUKOCYTESUR Negative 10/29/2022 0906    Lab Results  Component Value Date   LABMICR See below: 10/29/2022   WBCUA None seen 10/29/2022   LABEPIT 0-10 10/29/2022   MUCUS Present 06/14/2021   BACTERIA None seen 10/29/2022    Pertinent Imaging: CT and Bone scan: Images reviewed and discussed with the patient  Results for orders placed during the hospital encounter of 03/29/18  DG Abd 1 View  Narrative CLINICAL DATA:  Constipation for a few weeks.  EXAM: ABDOMEN - 1 VIEW  COMPARISON:  None.  FINDINGS: Normal bowel gas pattern without evidence of obstruction.  Mild generalized increased colonic stool burden.  No evidence of renal or ureteral stones.  Aortic stent graft extends from the lower aspect of L2 to common iliac artery arms over the upper sacrum.  Skeletal structures intact.  IMPRESSION: 1. No acute findings.  No evidence of bowel obstruction. 2. Mild generalized increased colonic stool burden.   Electronically Signed By: Amie Portland M.D. On: 03/30/2018 08:52  No results found for this or any previous  visit.  No results found for this or any previous visit.  No results found for this or any previous visit.  Results for orders placed during the hospital encounter of 07/14/16  US Renal  Narrative CLINICAL DATA:  Protein urea, chronic renal insufficiency, assess postvoid residual volume  EXAM: RENAL / URINARY TRACT ULTRASOUND COMPLETE  COMPARISON:  Renal ultrasound of December 22, 2014  FINDINGS: Right Kidney:  Length: 12.2 cm. The renal cortical echotexture is somewhat greater than that of the adjacent liver. Multiple cysts of present 2.7 in diameter. There is no hydronephrosis.  Left Kidney:  Length: 11.3 cm. The renal cortical echotexture is similar to that of the right kidney. There are multiple cystic structures measuring up to 1.4  cm in diameter. There is no hydronephrosis.  Bladder:  Urinary bladder exhibits diffuse wall thickening. The prostate gland produces a prominent impression upon the bladder base. The prevoid volume was 187 cc. The postvoid volume is 28 cc.  IMPRESSION: Multiple simple appearing renal cysts more conspicuous than on the previous study. Increased renal cortical echotexture consistent with medical renal disease. No hydronephrosis.  Thickened gallbladder wall.  28 cc postvoid residual volume.   Electronically Signed By: David  [place] M.D. On: 07/14/2016 11:40  No valid procedures specified. No results found for this or any previous visit.  No results found for this or any previous visit.   Assessment & Plan:    1. Prostate cancer (HCC) Followup 6 months PSA  2. Benign prostatic hyperplasia with urinary obstruction -continue flomax 0.4mg  daily  3. Weak urinary stream Continue flomax 0.4mg  daily   No follow-ups on file.  Wilkie Aye, MD  [facility] [address]

## 2022-12-26 DIAGNOSIS — Z992 Dependence on renal dialysis: Secondary | ICD-10-CM | POA: Diagnosis not present

## 2022-12-26 DIAGNOSIS — N186 End stage renal disease: Secondary | ICD-10-CM | POA: Diagnosis not present

## 2022-12-29 DIAGNOSIS — N186 End stage renal disease: Secondary | ICD-10-CM | POA: Diagnosis not present

## 2022-12-29 DIAGNOSIS — Z992 Dependence on renal dialysis: Secondary | ICD-10-CM | POA: Diagnosis not present

## 2022-12-30 ENCOUNTER — Encounter: Payer: Self-pay | Admitting: Urology

## 2022-12-30 NOTE — Patient Instructions (Signed)

## 2023-01-01 ENCOUNTER — Ambulatory Visit: Payer: Self-pay | Admitting: *Deleted

## 2023-01-01 NOTE — Patient Outreach (Signed)
  Care Coordination   Follow Up Visit Note   01/01/2023 Name: Ronald [name] MRN: [id number] DOB: [birth year]  Ronald [name] is a 82 y.o. year old male who sees Ronald [name], Ronald Hazel, MD for primary care. I spoke with  Ronald [name] by phone today.  What matters to the patients health and wellness today?  Imaging, swelling   Hemodyalis twice a week  Not big or socializer at all wife think he is depressed he question Age, location, risks   Goals Addressed   None     SDOH assessments and interventions completed:  No     Care Coordination Interventions:  Yes, provided   Follow up plan: Follow up call scheduled for 01/07/23    Encounter Outcome:  Patient Visit Completed   Cala Bradford L. Noelle Penner, RN, BSN, [facility]  VBCI Care Management Coordinator  [phone]  Fax: [phone]

## 2023-01-02 DIAGNOSIS — Z992 Dependence on renal dialysis: Secondary | ICD-10-CM | POA: Diagnosis not present

## 2023-01-02 DIAGNOSIS — N186 End stage renal disease: Secondary | ICD-10-CM | POA: Diagnosis not present

## 2023-01-05 DIAGNOSIS — N186 End stage renal disease: Secondary | ICD-10-CM | POA: Diagnosis not present

## 2023-01-05 DIAGNOSIS — Z992 Dependence on renal dialysis: Secondary | ICD-10-CM | POA: Diagnosis not present

## 2023-01-07 ENCOUNTER — Ambulatory Visit: Payer: Self-pay | Admitting: *Deleted

## 2023-01-07 ENCOUNTER — Telehealth: Payer: Self-pay

## 2023-01-07 NOTE — Patient Instructions (Signed)
Visit Information  Thank you for taking time to visit with me today. Please don't hesitate to contact me if I can be of assistance to you.   Following are the goals we discussed today:   Goals Addressed             This Visit's Progress    Decrease leg swelling - care coordination services       Patient will manage edema/swelling at home   Interventions Today    Flowsheet Row Most Recent Value  Chronic Disease   Chronic disease during today's visit Other, Chronic Kidney Disease/End Stage Renal Disease (ESRD)  General Interventions   General Interventions Discussed/Reviewed General Interventions Reviewed, Labs, Communication with, Doctor Visits  Labs --  [place] CT]  Doctor Visits Discussed/Reviewed Doctor Visits Reviewed            Interventions Today    Flowsheet Row Most Recent Value  Chronic Disease   Chronic disease during today's visit Other, Chronic Kidney Disease/End Stage Renal Disease (ESRD)  General Interventions   General Interventions Discussed/Reviewed General Interventions Reviewed, Labs, Communication with, Doctor Visits  Labs --  [imaging CT]  Doctor Visits Discussed/Reviewed Doctor Visits Reviewed              Our next appointment is by telephone on 01/07/23 at 1 pm  Please call the care guide team at [phone] if you need to cancel or reschedule your appointment.   If you are experiencing a Mental Health or Behavioral Health Crisis or need someone to talk to, please call the Suicide and Crisis Lifeline: 988 call the [place] National Suicide Prevention Lifeline: 306 514 6714 or TTY: 610-445-2516 TTY (405)645-0057) to talk to a trained counselor call 1-800-273-TALK (toll free, 24 hour hotline) call the [facility]: (272)217-9212 call 911   Patient verbalizes understanding of instructions and care plan provided today and agrees to view in MyChart. Active MyChart status and patient understanding of how to access  instructions and care plan via MyChart confirmed with patient.     The patient has been provided with contact information for the care management team and has been advised to call with any health related questions or concerns.   Candiss Galeana L. Noelle Penner, RN, BSN, [facility]  VBCI Care Management Coordinator  [phone]  Fax: [phone]

## 2023-01-07 NOTE — Patient Instructions (Signed)
Visit Information  Thank you for taking time to visit with me today. Please don't hesitate to contact me if I can be of assistance to you.   Following are the goals we discussed today:   Goals Addressed             This Visit's Progress    Decrease leg swelling, knowledge of aorta aneurysm- care coordination services   On track    Patient will manage edema/swelling at home Patient will request assistance as needed for signs of depression   Interventions Today    Flowsheet Row Most Recent Value  Chronic Disease   Chronic disease during today's visit Other, Chronic Kidney Disease/End Stage Renal Disease (ESRD)  [Aorta aneurysm clarification, loss of interest in doing things /depression screening]  General Interventions   General Interventions Discussed/Reviewed General Interventions Reviewed, Doctor Visits, Communication with  Doctor Visits Discussed/Reviewed Doctor Visits Reviewed, Specialist  PCP/Specialist Visits Compliance with follow-up visit  Communication with --  Nicholes Stairs and message to the urology MD office/lefet a message with [facility] office coordinator, with RN CM office number. Sent clinical pool message to [facility] urology]  Education Interventions   Education Provided Provided Education  [depression]  Provided Verbal Education On Mental Health/Coping with Illness  Mental Health Interventions   Mental Health Discussed/Reviewed --  [encouraged to visit others, go out of the home,]              Our next appointment is by telephone on 01/21/23 at 1pm  Please call the care guide team at [phone] if you need to cancel or reschedule your appointment.   If you are experiencing a Mental Health or Behavioral Health Crisis or need someone to talk to, please call the Suicide and Crisis Lifeline: 988 call the [place] National Suicide Prevention Lifeline: 630-134-2615 or TTY: 5043770827 TTY 640-646-7025) to talk to a trained counselor call 1-800-273-TALK (toll free, 24 hour  hotline) call the [facility]: 9703481038 call 911   No computer access, no preference for copy of AVS      The patient has been provided with contact information for the care management team and has been advised to call with any health related questions or concerns.   Jahron Hunsinger L. Noelle Penner, RN, BSN, [facility]  VBCI Care Management Coordinator  [phone]  Fax: [phone]

## 2023-01-07 NOTE — Patient Outreach (Signed)
  Care Coordination   Follow Up Visit Note   01/07/2023 Name: KARTHIK COFFER MRN: [id number] DOB: 19-Aug-1940  Deddrick Mehler Zarrella is a 82 y.o. year old male who sees [name], [name], MD for primary care. I spoke with Wife of  Keywon Tkachenko Santino by phone today.  What matters to the patients health and wellness today?  Aorta aneurysm clarification, loss of interest in doing things   Goals Addressed             This Visit's Progress    Decrease leg swelling - care coordination services       Patient will manage edema/swelling at home   Interventions Today    Flowsheet Row Most Recent Value  Chronic Disease   Chronic disease during today's visit Other, Chronic Kidney Disease/End Stage Renal Disease (ESRD)  General Interventions   General Interventions Discussed/Reviewed General Interventions Reviewed, Labs, Communication with, Doctor Visits  Labs --  [place] CT]  Doctor Visits Discussed/Reviewed Doctor Visits Reviewed            Interventions Today    Flowsheet Row Most Recent Value  Chronic Disease   Chronic disease during today's visit Other, Chronic Kidney Disease/End Stage Renal Disease (ESRD)  General Interventions   General Interventions Discussed/Reviewed General Interventions Reviewed, Labs, Communication with, Doctor Visits  Labs --  [place] CT]  Doctor Visits Discussed/Reviewed Doctor Visits Reviewed              SDOH assessments and interventions completed:  No     Care Coordination Interventions:  Yes, provided   Follow up plan: Follow up call scheduled for 01/07/23    Encounter Outcome:  Patient Visit Completed   Cala Bradford L. Noelle Penner, RN, BSN, [facility]  VBCI Care Management Coordinator  [phone]  Fax: [phone]

## 2023-01-07 NOTE — Patient Outreach (Signed)
Care Coordination   Follow Up Visit Note   01/07/2023 Name: Ronald [name] MRN: [id number] DOB: [birth year]  Ronald [name] is a 82 y.o. year old male who sees Ronald [name], Ronald Hazel, MD for primary care. I spoke with  wife & Ronald [name] by phone today.  What matters to the patients health and wellness today?  Aorta aneurysm size, depression screen   Aorta aneurysm- wife remains interested in the treatment plan and status of the aorta aneurysm. Confirms not outreach from the urology staff  Ronald [name] voices concern that the patient is still "not in good spirits" She reports he sleeps a lot throughout the day Ronald [name] reports he does not have and interest in going out or doing things. He does look forward to the Thanksgiving holiday. He reports "same from day to day"  RN CM was able to engage him in conversation about the holiday and possible plans. Only able to ask him partial questions before his wife came back on the telephone line to answer more questions.   Goals Addressed             This Visit's Progress    Decrease leg swelling, knowledge of aorta aneurysm- care coordination services   On track    Patient will manage edema/swelling at home Patient will request assistance as needed for signs of depression   Interventions Today    Flowsheet Row Most Recent Value  Chronic Disease   Chronic disease during today's visit Other, Chronic Kidney Disease/End Stage Renal Disease (ESRD)  [Aorta aneurysm clarification, loss of interest in doing things /depression screening]  General Interventions   General Interventions Discussed/Reviewed General Interventions Reviewed, Doctor Visits, Communication with  Doctor Visits Discussed/Reviewed Doctor Visits Reviewed, Specialist  PCP/Specialist Visits Compliance with follow-up visit  Communication with --  Ronald [name] and message to the urology MD office/lefet a message with [facility] office coordinator, with RN CM office  number. Sent clinical pool message to [facility] urology]  Education Interventions   Education Provided Provided Education  [depression]  Provided Verbal Education On Mental Health/Coping with Illness  Mental Health Interventions   Mental Health Discussed/Reviewed --  [encouraged to visit others, go out of the home,]              SDOH assessments and interventions completed:  Yes  SDOH Interventions Today    Flowsheet Row Most Recent Value  SDOH Interventions   Food Insecurity Interventions Intervention Not Indicated  Housing Interventions Intervention Not Indicated  Transportation Interventions Other (Comment)  [patient insurance coverage with limited transportation benefits  Patient and wife referred to Care guide for transportation assist see care guide notes]  Utilities Interventions Intervention Not Indicated  Financial Strain Interventions Intervention Not Indicated  Stress Interventions Intervention Not Indicated  Social Connections Interventions Patient Declined  Health Literacy Interventions Intervention Not Indicated       Flowsheet Row Care Coordination from 01/01/2023 in [facility] Care Coordination  PHQ-2 Total Score 2      Flowsheet Row Care Coordination from 01/01/2023 in [facility] Care Coordination  PHQ-9 Total Score 5         01/07/2023    1:32 PM  Depression screen PHQ 2/9  Decreased Interest 1  Down, Depressed, Hopeless 1  PHQ - 2 Score 2  Altered sleeping 1  Tired, decreased energy 1  Change in appetite 1  Feeling bad or failure about yourself  0  Trouble concentrating 0  Moving slowly or  fidgety/restless 0  Suicidal thoughts 0  PHQ-9 Score 5  Difficult doing work/chores Not difficult at all     Care Coordination Interventions:  Yes, provided   Follow up plan: Follow up call scheduled for 01/21/23    Encounter Outcome:  Patient Visit Completed   Ronald Bradford L. Noelle Penner, RN, BSN, [facility]  VBCI Care  Management Coordinator  [phone]  Fax: [phone]

## 2023-01-07 NOTE — Telephone Encounter (Signed)
Cala Bradford nurse care manager called and states that wife would like a call regarding pt's recent imaging.  Per MD note on 12/24/2022 CT imaging was discussed during visit.  I returned a call to Tally Due and left a message requesting a call back Monday for results.  Holiday office hours provided.

## 2023-01-10 DIAGNOSIS — Z992 Dependence on renal dialysis: Secondary | ICD-10-CM | POA: Diagnosis not present

## 2023-01-10 DIAGNOSIS — N186 End stage renal disease: Secondary | ICD-10-CM | POA: Diagnosis not present

## 2023-01-12 DIAGNOSIS — N186 End stage renal disease: Secondary | ICD-10-CM | POA: Diagnosis not present

## 2023-01-12 DIAGNOSIS — Z992 Dependence on renal dialysis: Secondary | ICD-10-CM | POA: Diagnosis not present

## 2023-01-12 NOTE — Telephone Encounter (Signed)
I reached out to patient again regarding imaging after receiving a triage call after hours.  No answer, I left another message requesting a call back for MD response to imaging at last OV.

## 2023-01-19 DIAGNOSIS — Z992 Dependence on renal dialysis: Secondary | ICD-10-CM | POA: Diagnosis not present

## 2023-01-19 DIAGNOSIS — N186 End stage renal disease: Secondary | ICD-10-CM | POA: Diagnosis not present

## 2023-01-20 ENCOUNTER — Ambulatory Visit: Payer: 59 | Admitting: Student

## 2023-01-21 ENCOUNTER — Ambulatory Visit: Payer: Self-pay | Admitting: *Deleted

## 2023-01-21 ENCOUNTER — Telehealth: Payer: Self-pay

## 2023-01-21 NOTE — Patient Outreach (Signed)
 Care Coordination   Follow Up Visit Note   05/15/2023 updated noted for 01/21/23 Name: Ronald [name] MRN: [id number] DOB: 01/14/1941  Ronald [name] is a 82 y.o. year old male who sees Ronald [name], Ronald Hazel, MD for primary care. I spoke with  Ronald [name] by phone today.  What matters to the patients health and wellness today?  Urology updated, transportation to dialysis    Transportation to & from Hemodialysis twice (Monday, Friday) a week  Personal car needs repair (transportation). Not in position to buy a new nor used car.  This week an elderly neighbor has assisted but they have medical issues of their own. Wife has outreached to navigator and has used all 48 usual trips. Ronald [name] uses Ronald [name]. Ronald [name] is the SW for Ronald [name]  Had to cancel the cardiology visit for 01/20/23  Has [employer] dual coverage that includes medicaid coverage Patient is ambulatory to & from car Memory issues and has been lost x 2 in [place] + patient Aware that Ronald [name] is expensive   Updated wife on [facility] dialysis, Dr Eden Emms for AAA vs Dr Malen Gauze client coordinator [phone] et [phone] or [phone]      Goals Addressed             This Visit's Progress    Decrease leg swelling, knowledge of aorta aneurysm- care coordination services       Patient will manage edema/swelling at home- 01/21/23 managing well  Patient will request assistance as needed for signs of depression- 01/21/23 continues to deny symptoms  Patient  Interventions Today    Flowsheet Row Most Recent Value  Chronic Disease   Chronic disease during today's visit Other, Chronic Kidney Disease/End Stage Renal Disease (ESRD)  General Interventions   General Interventions Discussed/Reviewed General Interventions Reviewed, Labs, Sick Day Rules, [facility], Doctor Visits, Communication with  Labs --  [urology testing updated from Ronald [name]]  Doctor Visits Discussed/Reviewed Doctor Visits Reviewed,  Specialist  [urology & dialysis discussed - Psychologist, occupational concerns]  PCP/Specialist Visits Compliance with follow-up visit  [Had to cancel recent cardiology visit related to no transportation]  Communication with PCP/Specialists, RN  Merchant navy officer chat to [place] at urology office to have her to outreach to wife,  Left message at [facility] about closure during holiday and possible assist as car repairs are needed Left a message for [facility] transporation to return a call to wife or RN CM]  Exercise Interventions   Exercise Discussed/Reviewed Exercise Reviewed, Physical Activity  Physical Activity Discussed/Reviewed Physical Activity Reviewed  [access mobility Wife confirmed he is able with time to get in & out of a car but has ongoing memory issues]  Education Interventions   Education Provided Provided Education  [insurance transportation benefit, [facility] transportation]  Provided Verbal Education On [employer], Development worker, community  Mental Health Interventions   Mental Health Discussed/Reviewed Mental Health Reviewed, Coping Strategies  Nutrition Interventions   Nutrition Discussed/Reviewed Nutrition Reviewed, Supplemental nutrition, Fluid intake  [confirmed patient with a poor appetite and forgets when he eats (memory changes) evidence by wife finding dishes in the sink in the mornings but patient can not recall getting up and eating and will question if he has eaten throughout the day]  Safety Interventions   Safety Discussed/Reviewed Safety Discussed, Fall Risk, Home Safety  Home Safety Assistive Devices              SDOH assessments and interventions completed:  Yes     Care  Coordination Interventions:  Yes, provided   Follow up plan: Follow up call scheduled for 05/22/23    Encounter Outcome:  Patient Visit Completed   Ronald Bradford L. Noelle Penner, RN, BSN, [facility]  VBCI Care Management Coordinator  [phone]  Fax: [phone]

## 2023-01-21 NOTE — Telephone Encounter (Signed)
I was contacted via epic from Edd Arbour, RN regarding wife having questions about patient's recent CT.  After speaking with Tally Due she states she was concerned that they were told that the CT showed no changes however, she noticed in the report a documented change.  After further review the change she was referring to abdominal aortic aneurysm.  After consulting with Dr. Ronne Binning he gave verbal to inform patient's wife that this is followed by Dr. Eden Emms, cardiology per past imaging.  I informed Tally Due of Dr. Dimas Millin response and confirmed with her that patient had an upcoming appt with cardiology.  I did let her know I would route imaging to cardiology with her request to discuss further at upcoming appt.  Patient also had questions of what Dr. Ronne Binning meant when he noted no metastatic disease, I explained that Dr. Ronne Binning was referring no spreading of his prostate cancer.  Patient's wife voiced understanding and nothing further was needed at that time.

## 2023-01-23 DIAGNOSIS — N186 End stage renal disease: Secondary | ICD-10-CM | POA: Diagnosis not present

## 2023-01-23 DIAGNOSIS — Z992 Dependence on renal dialysis: Secondary | ICD-10-CM | POA: Diagnosis not present

## 2023-01-26 DIAGNOSIS — N186 End stage renal disease: Secondary | ICD-10-CM | POA: Diagnosis not present

## 2023-01-26 DIAGNOSIS — Z992 Dependence on renal dialysis: Secondary | ICD-10-CM | POA: Diagnosis not present

## 2023-01-30 DIAGNOSIS — N186 End stage renal disease: Secondary | ICD-10-CM | POA: Diagnosis not present

## 2023-01-30 DIAGNOSIS — Z992 Dependence on renal dialysis: Secondary | ICD-10-CM | POA: Diagnosis not present

## 2023-02-02 DIAGNOSIS — N186 End stage renal disease: Secondary | ICD-10-CM | POA: Diagnosis not present

## 2023-02-02 DIAGNOSIS — Z992 Dependence on renal dialysis: Secondary | ICD-10-CM | POA: Diagnosis not present

## 2023-02-09 DIAGNOSIS — N186 End stage renal disease: Secondary | ICD-10-CM | POA: Diagnosis not present

## 2023-02-09 DIAGNOSIS — Z992 Dependence on renal dialysis: Secondary | ICD-10-CM | POA: Diagnosis not present

## 2023-02-10 DIAGNOSIS — N186 End stage renal disease: Secondary | ICD-10-CM | POA: Diagnosis not present

## 2023-02-10 DIAGNOSIS — Z992 Dependence on renal dialysis: Secondary | ICD-10-CM | POA: Diagnosis not present

## 2023-02-12 NOTE — Progress Notes (Signed)
 Cardiology Office Note    Date:  02/24/2023   ID:  Ronald [name], DOB 1940-05-21, MRN [id number]   PCP:  Shona Norleen PEDLAR, MD   [facility]  Cardiologist:  Maude Emmer, MD  Advanced Practice Provider:  No care team member to display Electrophysiologist:  None     History of Present Illness:  Ronald [name] is a 83 y.o. male with history of hypertension, hyperlipidemia, CKD, CAD status post CABG 1999 cath 2009 LIMA patent, SVG to the circumflex patent and native circumflex to RCA collaterals with total occlusion of native coronary arteries.  All done in [place]. Had a mini stroke during a procedure in [place] as well.  Had AAA repair 01/29/2012 op note reported small type II endoleak at end of procedure.  Patient was seen for the first time by me 07/2016 and exercise Myoview  ordered because of likely need for dialysis and surgery for fistula.  Patient had hypertensive response to exercise prior scar no ischemia LVEF 56% low risk study.  2D echo 05/11/2019 LVEF 55 to 60% grade 2 DD  Seen by PA 08/06/20 BP up had central catheter for dialysis access Subsequently had left radio-cephalic AV fistula with Dr Oris 10/16/20 Previously had right tunneled IJ catheter on 08/02/20 K 4.2 Cr 6.5   TTE 11/13/22 EF 40-45% moderate AS mean gradient 18.8 peak 44.4 mmHg AVA 1.2 cm2 DVI 0.29   Myovue 09/12/20 fixed defect in mid/apical anteroseptal wall and apical to basal inferior lateral wall no ischemia EF poorly estimated 31% frequent PAC short bursts of SVT Was not taking coreg  at time    From [place] Lived in [place] Has two older children son in [place] and daughter in [place]  Wife Is younger and healthy   Dialysis going well Sees Early and has left radiocephalic fistula now had some memory issues and noted 50-69% left ICA stenosis 07/23/21 Seen by Missy HOUSTON and mentioned fenestrated graft to seal proximal neck but patient did not commit to anything 04/17/22   Memory seems worse today no  cardiac symptoms   [facility]ospitalized at [facility] 10/2-7 2024. Had rapid afib with chest pain and elevated troponin 9187 EF 40-45% on TTE 11/12/22 mild MR moderate AS DOAC d/c due to advanced age, dementia and fall risk DNR palliative patient   No cardiac complaints. Lots of questions about his recent PET/CT scans Reviewed with him PET with no metastatic prostate CA. CT abdomen increased size of AAA  11 x 12.8 previously 10/4 x 12.3 with endoleak 2 As far as I can tell not having any procedures at [facility] as special fenestrated graft would be needed   Past Medical History:  Diagnosis Date   GERD (gastroesophageal reflux disease)    Glaucoma    Hypertension    Macular degeneration    Myocardial infarction [facility])    1999   Prostate cancer (HCC) 02/2018   Renal insufficiency    Stroke (HCC)    no deficits; had stroke while trying to place stents in legs.    Past Surgical History:  Procedure Laterality Date   ABDOMINAL AORTIC ANEURYSM REPAIR  2012   IN [place]    APPENDECTOMY     AV FISTULA PLACEMENT Left 10/16/2020   Procedure: LEFT ARM ARTERIOVENOUS (AV) FISTULA CREATION;  Surgeon: Oris Krystal FALCON, MD;  Location: [facility];  Service: Vascular;  Laterality: Left;   CORONARY ARTERY BYPASS GRAFT  01/1998   3 vessels 18 years ago   INGUINAL HERNIA  REPAIR Right 09/20/2015   Procedure: REPAIR RIGHT INGUINAL HERNIA  WITH MESH;  Surgeon: Selinda Artist Moats, MD;  Location: [facility];  Service: General;  Laterality: Right;   INSERTION OF DIALYSIS CATHETER Right 08/02/2020   Procedure: INSERTION OF RIGHT TUNNELED INTERNAL JUGULAR  DIALYSIS CATHETER;  Surgeon: Oris Krystal FALCON, MD;  Location: [facility];  Service: Vascular;  Laterality: Right;   REMOVAL OF A DIALYSIS CATHETER N/A 03/19/2021   Procedure: MINOR REMOVAL OF A TUNNELED DIALYSIS CATHETER;  Surgeon: Oris Krystal FALCON, MD;  Location: [facility];  Service: Vascular;  Laterality: N/A;   tumor removal from bladder      Current Medications: Current Meds  Medication Sig    aspirin  EC 81 MG tablet Take 1 tablet (81 mg total) by mouth daily. Swallow whole.   atorvastatin  (LIPITOR) 80 MG tablet Take 80 mg by mouth in the morning.   calcium  acetate (PHOSLO ) 667 MG capsule Take 1,334 mg by mouth 3 (three) times daily. citracal   carvedilol  (COREG ) 25 MG tablet Take 1 tablet (25 mg total) by mouth 2 (two) times daily with a meal.   Magnesium  250 MG TABS Take 1 tablet by mouth at bedtime.   tamsulosin  (FLOMAX ) 0.4 MG CAPS capsule Take 1 capsule (0.4 mg total) by mouth daily after supper.   traMADol  (ULTRAM ) 50 MG tablet Take 50 mg by mouth 3 (three) times daily as needed (pain.).   zolpidem  (AMBIEN ) 10 MG tablet Take 10 mg by mouth at bedtime.     Allergies:   Patient has no known allergies.   Social History   Socioeconomic History   Marital status: Married    Spouse name: Not on file   Number of children: Not on file   Years of education: Not on file   Highest education level: Not on file  Occupational History   Not on file  Tobacco Use   Smoking status: Former    Current packs/day: 0.50    Average packs/day: 0.5 packs/day for 65.5 years (32.8 ttl pk-yrs)    Types: Cigarettes    Start date: 08/07/1957   Smokeless tobacco: Never   Tobacco comments:    on chantix now  Vaping Use   Vaping status: Never Used  Substance and Sexual Activity   Alcohol use: No   Drug use: No   Sexual activity: Yes  Other Topics Concern   Not on file  Social History Narrative   Right handed   Drinks caffeine   One story home   Social Drivers of Health   Financial Resource Strain: Low Risk  (01/07/2023)   Overall Financial Resource Strain (CARDIA)    Difficulty of Paying Living Expenses: Not hard at all  Food Insecurity: No Food Insecurity (01/07/2023)   Hunger Vital Sign    Worried About Running Out of Food in the Last Year: Never true    Ran Out of Food in the Last Year: Never true  Transportation Needs: Unmet Transportation Needs (01/07/2023)   PRAPARE -  Administrator, Civil Service (Medical): Yes    Lack of Transportation (Non-Medical): No  Physical Activity: Unknown (11/10/2017)   [facility], [facility]   Exercise Vital Sign    Days of Exercise per Week: Patient declined    Minutes of Exercise per Session: Patient declined  Stress: No Stress Concern Present (01/07/2023)   [employer] of Occupational Health - Occupational Stress Questionnaire    Feeling of Stress : Only a  little  Social Connections: Unknown (01/07/2023)   Social Connection and Isolation Panel [NHANES]    Frequency of Communication with Friends and Family: Once a week    Frequency of Social Gatherings with Friends and Family: Once a week    Attends Religious Services: Patient declined    Database Administrator or Organizations: Patient declined    Attends Banker Meetings: Patient declined    Marital Status: Married     Family History:  The patient's  family history includes Cancer in his father.   ROS:   Please see the history of present illness.    ROS All other systems reviewed and are negative.   PHYSICAL EXAM:   VS:  BP (!) 154/70   Pulse 69   Ht 6' 2 (1.88 m)   Wt 203 lb 9.6 oz (92.4 kg)   SpO2 95%   BMI 26.14 kg/m   Physical Exam   Affect appropriate Healthy:  appears stated age HEENT: normal Neck right IJ tunneled catheter removed  JVP normal no bruits no thyromegaly Lungs clear with no wheezing and good diaphragmatic motion Heart:  S1/S2 AS  murmur, no rub, gallop or click PMI normal Abdomen: benighn, BS positve, no tenderness, no AAA no bruit.  No HSM or HJR Distal pulses intact with no bruits No edema Neuro non-focal Skin warm and dry No muscular weakness Left arm fistula with nice thrill    Wt Readings from Last 3 Encounters:  02/24/23 203 lb 9.6 oz (92.4 kg)  11/17/22 194 lb 14.2 oz (88.4 kg)  11/08/22 207 lb 14.3 oz (94.3 kg)      Studies/Labs Reviewed:   EKG:   SR  PaCls LVH rate 74 08/02/20 02/24/2023 SR rate 58 PAC LAD/RBBB   Recent Labs: 11/12/2022: B Natriuretic Peptide 1,510.0; Magnesium  2.0 11/14/2022: TSH 3.530 11/17/2022: BUN 53; Creatinine, Ser 8.22; Hemoglobin 10.0; Platelets 164; Potassium 4.0; Sodium 133   Lipid Panel    Component Value Date/Time   CHOL 157 11/14/2022 0522   TRIG 71 11/14/2022 0522   HDL 63 11/14/2022 0522   CHOLHDL 2.5 11/14/2022 0522   VLDL 14 11/14/2022 0522   LDLCALC 80 11/14/2022 0522    Additional studies/ records that were reviewed today include:   Echo:  11/13/22   1. Left ventricular ejection fraction, by estimation, is 40 to 45%. The  left ventricle has mildly decreased function. The left ventricle  demonstrates global hypokinesis. There is mild left ventricular  hypertrophy. Left ventricular diastolic parameters  are consistent with Grade II diastolic dysfunction (pseudonormalization).  Elevated left atrial pressure.   2. Right ventricular systolic function is normal. The right ventricular  size is normal. Tricuspid regurgitation signal is inadequate for assessing  PA pressure.   3. Left atrial size was severely dilated.   4. Right atrial size was mildly dilated.   5. The mitral valve is abnormal. Mild mitral valve regurgitation. No  evidence of mitral stenosis.   6. The tricuspid valve is abnormal.   7. The aortic valve has an indeterminant number of cusps. There is  moderate calcification of the aortic valve. There is moderate thickening  of the aortic valve. Aortic valve regurgitation is not visualized.  Moderate aortic valve stenosis. Aortic valve  mean gradient measures 18.8 mmHg. Aortic valve peak gradient measures 44.4  mmHg. Aortic valve area, by VTI measures 1.10 cm. DI 0.29   8. The inferior vena cava is dilated in size with <50% respiratory  variability, suggesting right  atrial pressure of 15 mmHg.   Myovue :  Study Result  Narrative & Impression  No diagnostic ST segment changes to  indicate ischemia. Frequent PACs and bursts of SVT/atrial tachycardia noted throughout the study. Patient asymptomatic in terms of palpitations. Small to medium sized, moderate intensity, fixed defects involving the mid to apical anteroseptal wall and apical to basal inferolateral wall. No large ischemic territories are demonstrated. Most consistent with infarct scar. This is a high risk study based on calculated LVEF. Nuclear stress EF: 31%. Suggest echocardiogram for more complete assessment of LVEF and possibility of ischemic cardiomyopathy.       Risk Assessment/Calculations:         ASSESSMENT:    1. S/P AAA (abdominal aortic aneurysm) repair        PLAN:   CAD : status post CABG 1999 cath 2009 LIMA patent, SVG to the circumflex patent and native circumflex to RCA collaterals with total occlusion of native coronary arteries.  Cardiac cath years ago with aborted intervention because of CVA during procedure.  Non ischemic myovue 8/3/222 continue medical Rx Recent 10/22024 SEMI Rx medically due to co morbidities EF 40-45%   ESRD has right IJ tunneled catheter removed  and now fistula in LUE f/u nephrology and Dr Oris [facility]VS  He feels horrible after dialysis Only getting dialysed Monday/Friday wants to go to once/week   AAA : endovascular repair stent graft 2013 in [place]  with endoleak/residual AAA last CT 12/31/22 done for prostate cancer noted enlarging AAA 11 x 12.8 cm needs VVS f/u Dr Oris mentioned referral to Oneil Phlegm at [facility]   Hypertension Well controlled.  Continue current medications and low sodium Dash type diet.    Hyperlipidemia on Lipitor managed by PCP  SVT:  with PACls continue beta blocker   Carotid:  50-69% LICA f/u duplex June 2023 duplex ordered   Prostate Cancer:  post seed implantation F/U McKenzie on flomax  Bone scan with no metastatic dx PSA 2.6 10/23/22   Moderate AS:  TTE 11/13/22 mean gradient 18.8 peak 44.4 mmHg AVA 1.2 cm2 DVI 0.29 Not likely candidate  for TAVR given co morbidities    F/U VVS at [facility]  enlarging AAA post stent graft with endoleak   F/U Cardiology in a year     Signed, Maude Emmer, MD  02/24/2023 1:18 PM    [facility] [address] [place], [place], [place]  [address] Phone: [phone]; Fax: [phone]

## 2023-02-13 DIAGNOSIS — Z992 Dependence on renal dialysis: Secondary | ICD-10-CM | POA: Diagnosis not present

## 2023-02-13 DIAGNOSIS — N186 End stage renal disease: Secondary | ICD-10-CM | POA: Diagnosis not present

## 2023-02-19 DIAGNOSIS — N186 End stage renal disease: Secondary | ICD-10-CM | POA: Diagnosis not present

## 2023-02-19 DIAGNOSIS — Z992 Dependence on renal dialysis: Secondary | ICD-10-CM | POA: Diagnosis not present

## 2023-02-23 DIAGNOSIS — Z992 Dependence on renal dialysis: Secondary | ICD-10-CM | POA: Diagnosis not present

## 2023-02-23 DIAGNOSIS — N186 End stage renal disease: Secondary | ICD-10-CM | POA: Diagnosis not present

## 2023-02-23 DIAGNOSIS — Z13220 Encounter for screening for lipoid disorders: Secondary | ICD-10-CM | POA: Diagnosis not present

## 2023-02-23 DIAGNOSIS — Z131 Encounter for screening for diabetes mellitus: Secondary | ICD-10-CM | POA: Diagnosis not present

## 2023-02-24 ENCOUNTER — Encounter: Payer: Self-pay | Admitting: Cardiovascular Disease

## 2023-02-24 ENCOUNTER — Ambulatory Visit: Payer: 59 | Attending: Cardiovascular Disease | Admitting: Cardiovascular Disease

## 2023-02-24 VITALS — BP 154/70 | HR 69 | Ht 74.0 in | Wt 203.6 lb

## 2023-02-24 DIAGNOSIS — N186 End stage renal disease: Secondary | ICD-10-CM | POA: Diagnosis not present

## 2023-02-24 DIAGNOSIS — Z951 Presence of aortocoronary bypass graft: Secondary | ICD-10-CM

## 2023-02-24 DIAGNOSIS — E782 Mixed hyperlipidemia: Secondary | ICD-10-CM

## 2023-02-24 DIAGNOSIS — Z8679 Personal history of other diseases of the circulatory system: Secondary | ICD-10-CM

## 2023-02-24 DIAGNOSIS — Z9889 Other specified postprocedural states: Secondary | ICD-10-CM

## 2023-02-24 NOTE — Patient Instructions (Signed)
Medication Instructions:  Your physician recommends that you continue on your current medications as directed. Please refer to the Current Medication list given to you today.  *If you need a refill on your cardiac medications before your next appointment, please call your pharmacy*   Lab Work: NONE   If you have labs (blood work) drawn today and your tests are completely normal, you will receive your results only by: MyChart Message (if you have MyChart) OR A paper copy in the mail If you have any lab test that is abnormal or we need to change your treatment, we will call you to review the results.   Testing/Procedures: NONE    Follow-Up: At [facility], you and your health needs are our priority.  As part of our continuing mission to provide you with exceptional heart care, we have created designated Provider Care Teams.  These Care Teams include your primary Cardiologist (physician) and Advanced Practice Providers (APPs -  Physician Assistants and Nurse Practitioners) who all work together to provide you with the care you need, when you need it.  We recommend signing up for the patient portal called "MyChart".  Sign up information is provided on this After Visit Summary.  MyChart is used to connect with patients for Virtual Visits (Telemedicine).  Patients are able to view lab/test results, encounter notes, upcoming appointments, etc.  Non-urgent messages can be sent to your provider as well.   To learn more about what you can do with MyChart, go to [email or web address].    Your next appointment:   6 month(s)  Provider:   Peter Nishan, MD    Other Instructions Thank you for choosing [facility]!    

## 2023-03-02 DIAGNOSIS — Z992 Dependence on renal dialysis: Secondary | ICD-10-CM | POA: Diagnosis not present

## 2023-03-02 DIAGNOSIS — N186 End stage renal disease: Secondary | ICD-10-CM | POA: Diagnosis not present

## 2023-03-09 DIAGNOSIS — Z992 Dependence on renal dialysis: Secondary | ICD-10-CM | POA: Diagnosis not present

## 2023-03-09 DIAGNOSIS — N186 End stage renal disease: Secondary | ICD-10-CM | POA: Diagnosis not present

## 2023-03-13 DIAGNOSIS — N186 End stage renal disease: Secondary | ICD-10-CM | POA: Diagnosis not present

## 2023-03-13 DIAGNOSIS — Z992 Dependence on renal dialysis: Secondary | ICD-10-CM | POA: Diagnosis not present

## 2023-03-15 ENCOUNTER — Other Ambulatory Visit (INDEPENDENT_AMBULATORY_CARE_PROVIDER_SITE_OTHER): Payer: Self-pay | Admitting: Ophthalmology

## 2023-03-15 ENCOUNTER — Other Ambulatory Visit: Payer: Self-pay | Admitting: Physician Assistant

## 2023-03-16 DIAGNOSIS — N186 End stage renal disease: Secondary | ICD-10-CM | POA: Diagnosis not present

## 2023-03-16 DIAGNOSIS — Z992 Dependence on renal dialysis: Secondary | ICD-10-CM | POA: Diagnosis not present

## 2023-03-20 DIAGNOSIS — N186 End stage renal disease: Secondary | ICD-10-CM | POA: Diagnosis not present

## 2023-03-20 DIAGNOSIS — Z992 Dependence on renal dialysis: Secondary | ICD-10-CM | POA: Diagnosis not present

## 2023-03-23 DIAGNOSIS — N186 End stage renal disease: Secondary | ICD-10-CM | POA: Diagnosis not present

## 2023-03-23 DIAGNOSIS — Z992 Dependence on renal dialysis: Secondary | ICD-10-CM | POA: Diagnosis not present

## 2023-03-27 DIAGNOSIS — N186 End stage renal disease: Secondary | ICD-10-CM | POA: Diagnosis not present

## 2023-03-27 DIAGNOSIS — Z992 Dependence on renal dialysis: Secondary | ICD-10-CM | POA: Diagnosis not present

## 2023-04-03 DIAGNOSIS — Z992 Dependence on renal dialysis: Secondary | ICD-10-CM | POA: Diagnosis not present

## 2023-04-03 DIAGNOSIS — N186 End stage renal disease: Secondary | ICD-10-CM | POA: Diagnosis not present

## 2023-04-06 DIAGNOSIS — N186 End stage renal disease: Secondary | ICD-10-CM | POA: Diagnosis not present

## 2023-04-06 DIAGNOSIS — Z992 Dependence on renal dialysis: Secondary | ICD-10-CM | POA: Diagnosis not present

## 2023-04-10 DIAGNOSIS — Z992 Dependence on renal dialysis: Secondary | ICD-10-CM | POA: Diagnosis not present

## 2023-04-10 DIAGNOSIS — N186 End stage renal disease: Secondary | ICD-10-CM | POA: Diagnosis not present

## 2023-04-13 DIAGNOSIS — Z992 Dependence on renal dialysis: Secondary | ICD-10-CM | POA: Diagnosis not present

## 2023-04-13 DIAGNOSIS — N186 End stage renal disease: Secondary | ICD-10-CM | POA: Diagnosis not present

## 2023-04-14 ENCOUNTER — Ambulatory Visit: Payer: 59 | Admitting: Vascular Surgery

## 2023-04-17 DIAGNOSIS — N186 End stage renal disease: Secondary | ICD-10-CM | POA: Diagnosis not present

## 2023-04-17 DIAGNOSIS — Z992 Dependence on renal dialysis: Secondary | ICD-10-CM | POA: Diagnosis not present

## 2023-04-20 DIAGNOSIS — N186 End stage renal disease: Secondary | ICD-10-CM | POA: Diagnosis not present

## 2023-04-20 DIAGNOSIS — Z992 Dependence on renal dialysis: Secondary | ICD-10-CM | POA: Diagnosis not present

## 2023-04-22 DIAGNOSIS — E782 Mixed hyperlipidemia: Secondary | ICD-10-CM | POA: Diagnosis not present

## 2023-04-22 DIAGNOSIS — N186 End stage renal disease: Secondary | ICD-10-CM | POA: Diagnosis not present

## 2023-04-22 DIAGNOSIS — Z992 Dependence on renal dialysis: Secondary | ICD-10-CM | POA: Diagnosis not present

## 2023-04-24 DIAGNOSIS — Z992 Dependence on renal dialysis: Secondary | ICD-10-CM | POA: Diagnosis not present

## 2023-04-24 DIAGNOSIS — N186 End stage renal disease: Secondary | ICD-10-CM | POA: Diagnosis not present

## 2023-04-27 DIAGNOSIS — Z992 Dependence on renal dialysis: Secondary | ICD-10-CM | POA: Diagnosis not present

## 2023-04-27 DIAGNOSIS — N186 End stage renal disease: Secondary | ICD-10-CM | POA: Diagnosis not present

## 2023-04-28 ENCOUNTER — Ambulatory Visit: Payer: 59 | Admitting: Vascular Surgery

## 2023-04-29 DIAGNOSIS — I132 Hypertensive heart and chronic kidney disease with heart failure and with stage 5 chronic kidney disease, or end stage renal disease: Secondary | ICD-10-CM | POA: Diagnosis not present

## 2023-04-29 DIAGNOSIS — R600 Localized edema: Secondary | ICD-10-CM | POA: Diagnosis not present

## 2023-04-29 DIAGNOSIS — I10 Essential (primary) hypertension: Secondary | ICD-10-CM | POA: Diagnosis not present

## 2023-04-29 DIAGNOSIS — I214 Non-ST elevation (NSTEMI) myocardial infarction: Secondary | ICD-10-CM | POA: Diagnosis not present

## 2023-04-29 DIAGNOSIS — J449 Chronic obstructive pulmonary disease, unspecified: Secondary | ICD-10-CM | POA: Diagnosis not present

## 2023-04-29 DIAGNOSIS — I110 Hypertensive heart disease with heart failure: Secondary | ICD-10-CM | POA: Diagnosis not present

## 2023-04-29 DIAGNOSIS — I480 Paroxysmal atrial fibrillation: Secondary | ICD-10-CM | POA: Diagnosis not present

## 2023-04-29 DIAGNOSIS — I350 Nonrheumatic aortic (valve) stenosis: Secondary | ICD-10-CM | POA: Insufficient documentation

## 2023-04-29 DIAGNOSIS — F17210 Nicotine dependence, cigarettes, uncomplicated: Secondary | ICD-10-CM | POA: Diagnosis not present

## 2023-04-29 DIAGNOSIS — K219 Gastro-esophageal reflux disease without esophagitis: Secondary | ICD-10-CM | POA: Diagnosis not present

## 2023-05-01 DIAGNOSIS — N186 End stage renal disease: Secondary | ICD-10-CM | POA: Diagnosis not present

## 2023-05-01 DIAGNOSIS — Z992 Dependence on renal dialysis: Secondary | ICD-10-CM | POA: Diagnosis not present

## 2023-05-04 DIAGNOSIS — Z992 Dependence on renal dialysis: Secondary | ICD-10-CM | POA: Diagnosis not present

## 2023-05-04 DIAGNOSIS — N186 End stage renal disease: Secondary | ICD-10-CM | POA: Diagnosis not present

## 2023-05-08 DIAGNOSIS — N186 End stage renal disease: Secondary | ICD-10-CM | POA: Diagnosis not present

## 2023-05-08 DIAGNOSIS — Z992 Dependence on renal dialysis: Secondary | ICD-10-CM | POA: Diagnosis not present

## 2023-05-11 DIAGNOSIS — Z992 Dependence on renal dialysis: Secondary | ICD-10-CM | POA: Diagnosis not present

## 2023-05-11 DIAGNOSIS — N186 End stage renal disease: Secondary | ICD-10-CM | POA: Diagnosis not present

## 2023-05-12 ENCOUNTER — Ambulatory Visit: Payer: 59 | Admitting: Vascular Surgery

## 2023-05-12 ENCOUNTER — Encounter: Payer: Self-pay | Admitting: Vascular Surgery

## 2023-05-12 VITALS — BP 152/78 | HR 67 | Temp 98.2°F | Resp 20 | Ht 74.0 in | Wt 206.2 lb

## 2023-05-12 DIAGNOSIS — I7143 Infrarenal abdominal aortic aneurysm, without rupture: Secondary | ICD-10-CM

## 2023-05-12 NOTE — Progress Notes (Signed)
 Patient name: Ronald [name] MRN: [id number] DOB: 24-Apr-1940 Sex: male  REASON FOR CONSULT: AAA  HPI: Ronald [name] is a 83 y.o. male, with history ESRD, hypertension, hyperlipidemia, CAD status post CABG, AAA repair in [place] for a 6.4 cm AAA that presents for follow-up of his AAA.  Previously seen by Dr. Arbie Cookey on 01/24/2023 with a 10 cm aneurysm with no proximal attachment of his endograft.  He was referred to Dr. Pattricia Boss at [facility].  The patient is here with his wife who is under the impression that he did not have any good options at [facility].  The note from Dr. Pattricia Boss suggests the patient will think about options and contact if they elect to proceed.  Past Medical History:  Diagnosis Date   GERD (gastroesophageal reflux disease)    Glaucoma    Hypertension    Macular degeneration    Myocardial infarction [facility])    1999   Prostate cancer (HCC) 02/2018   Renal insufficiency    Stroke [facility])    no deficits; had stroke while trying to place "stents in legs".    Past Surgical History:  Procedure Laterality Date   ABDOMINAL AORTIC ANEURYSM REPAIR  2012   IN [place]   APPENDECTOMY     AV FISTULA PLACEMENT Left 10/16/2020   Procedure: LEFT ARM ARTERIOVENOUS (AV) FISTULA CREATION;  Surgeon: Larina Earthly, MD;  Location: [facility];  Service: Vascular;  Laterality: Left;   CORONARY ARTERY BYPASS GRAFT  01/1998   3 vessels 18 years ago   INGUINAL HERNIA REPAIR Right 09/20/2015   Procedure: REPAIR RIGHT INGUINAL HERNIA  WITH MESH;  Surgeon: Ancil Linsey, MD;  Location: [facility];  Service: General;  Laterality: Right;   INSERTION OF DIALYSIS CATHETER Right 08/02/2020   Procedure: INSERTION OF RIGHT TUNNELED INTERNAL JUGULAR  DIALYSIS CATHETER;  Surgeon: Larina Earthly, MD;  Location: [facility];  Service: Vascular;  Laterality: Right;   REMOVAL OF A DIALYSIS CATHETER N/A 03/19/2021   Procedure: MINOR REMOVAL OF A TUNNELED DIALYSIS CATHETER;  Surgeon: Larina Earthly, MD;  Location: [facility];   Service: Vascular;  Laterality: N/A;   tumor removal from bladder      Family History  Problem Relation Age of Onset   Cancer Father     SOCIAL HISTORY: Social History   Socioeconomic History   Marital status: Married    Spouse name: Not on file   Number of children: Not on file   Years of education: Not on file   Highest education level: Not on file  Occupational History   Not on file  Tobacco Use   Smoking status: Former    Current packs/day: 0.50    Average packs/day: 0.5 packs/day for 65.8 years (32.9 ttl pk-yrs)    Types: Cigarettes    Start date: 08/07/1957   Smokeless tobacco: Never   Tobacco comments:    on chantix now  Vaping Use   Vaping status: Never Used  Substance and Sexual Activity   Alcohol use: No   Drug use: No   Sexual activity: Yes  Other Topics Concern   Not on file  Social History Narrative   Right handed   Drinks caffeine   One story home   Social Drivers of Health   Financial Resource Strain: Low Risk  (01/07/2023)   Overall Financial Resource Strain (CARDIA)    Difficulty of Paying Living Expenses: Not hard at all  Food Insecurity: No Food Insecurity (01/07/2023)  Hunger Vital Sign    Worried About Running Out of Food in the Last Year: Never true    Ran Out of Food in the Last Year: Never true  Transportation Needs: Unmet Transportation Needs (01/07/2023)   PRAPARE - Administrator, Civil Service (Medical): Yes    Lack of Transportation (Non-Medical): No  Physical Activity: Unknown (11/10/2017)   Received from [facility], [facility]   Exercise Vital Sign    Days of Exercise per Week: Patient declined    Minutes of Exercise per Session: Patient declined  Stress: No Stress Concern Present (01/07/2023)   [employer] of Occupational Health - Occupational Stress Questionnaire    Feeling of Stress : Only a little  Social Connections: Unknown (01/07/2023)   Social Connection and Isolation Panel [NHANES]     Frequency of Communication with Friends and Family: Once a week    Frequency of Social Gatherings with Friends and Family: Once a week    Attends Religious Services: Patient declined    Database administrator or Organizations: Patient declined    Attends Banker Meetings: Patient declined    Marital Status: Married  Catering manager Violence: Unknown (11/10/2017)   Received from [facility], [facility]   Humiliation, Afraid, Rape, and Kick questionnaire    Fear of Current or Ex-Partner: Patient declined    Emotionally Abused: Patient declined    Physically Abused: Patient declined    Sexually Abused: Patient declined    No Known Allergies  Current Outpatient Medications  Medication Sig Dispense Refill   aspirin EC 81 MG tablet Take 1 tablet (81 mg total) by mouth daily. Swallow whole.     atorvastatin (LIPITOR) 80 MG tablet Take 80 mg by mouth in the morning.     calcium acetate (PHOSLO) 667 MG capsule Take 1,334 mg by mouth 3 (three) times daily. citracal     carvedilol (COREG) 25 MG tablet Take 1 tablet (25 mg total) by mouth 2 (two) times daily with a meal. 60 tablet 1   Magnesium 250 MG TABS Take 1 tablet by mouth at bedtime.     tamsulosin (FLOMAX) 0.4 MG CAPS capsule Take 1 capsule (0.4 mg total) by mouth daily after supper. 90 capsule 3   traMADol (ULTRAM) 50 MG tablet Take 50 mg by mouth 3 (three) times daily as needed (pain.).     zolpidem (AMBIEN) 10 MG tablet Take 10 mg by mouth at bedtime.     No current facility-administered medications for this visit.    REVIEW OF SYSTEMS:  [X]  denotes positive finding, [ ]  denotes negative finding Cardiac  Comments:  Chest pain or chest pressure:    Shortness of breath upon exertion:    Short of breath when lying flat:    Irregular heart rhythm:        Vascular    Pain in calf, thigh, or hip brought on by ambulation:    Pain in feet at night that wakes you up from your sleep:     Blood clot in your veins:     Leg swelling:         Pulmonary    Oxygen at home:    Productive cough:     Wheezing:         Neurologic    Sudden weakness in arms or legs:     Sudden numbness in arms or legs:     Sudden onset of difficulty speaking or  slurred speech:    Temporary loss of vision in one eye:     Problems with dizziness:         Gastrointestinal    Blood in stool:     Vomited blood:         Genitourinary    Burning when urinating:     Blood in urine:        Psychiatric    Major depression:         Hematologic    Bleeding problems:    Problems with blood clotting too easily:        Skin    Rashes or ulcers:        Constitutional    Fever or chills:      PHYSICAL EXAM: Vitals:   05/12/23 1439  BP: (!) 152/78  Pulse: 67  Resp: 20  Temp: 98.2 F (36.8 C)  TempSrc: Oral  SpO2: 96%  Weight: 206 lb 3.2 oz (93.5 kg)  Height: 6\' 2"  (1.88 m)    GENERAL: The patient is a well-nourished male, in no acute distress. The vital signs are documented above. CARDIAC: There is a regular rate and rhythm.  VASCULAR:  Bilateral femoral pulses palpable Right PT palpable Left DP palpable PULMONARY: No respiratory distress. ABDOMEN: Soft and non-tender.  No pain with palpation of aneurysm. MUSCULOSKELETAL: There are no major deformities or cyanosis. NEUROLOGIC: No focal weakness or paresthesias are detected. SKIN: There are no ulcers or rashes noted. PSYCHIATRIC: The patient has a normal affect.  DATA:   CT abdomen pelvis reviewed from 12/16/2022 with a large 12 cm AAA with no proximal attachment with type Ia endoleak of previous endograft  Assessment/Plan:  83 y.o. male, with history ESRD, hypertension, hyperlipidemia, CAD status post CABG, AAA repair in [place] for a 6.4 cm AAA that presents for follow-up of his AAA.  Previously seen by Dr. Arbie Cookey on 01/24/2023 with a 10 cm aneurysm with no proximal attachment of his endograft.  He was referred to Dr. Pattricia Boss at [facility].  Patient is back  today with his wife.  I had a long discussion with them about their visit to [facility].  They were under the impression that he did not have any good options at [facility].  The notes from Dr. Pattricia Boss suggest that the patient will contact if he elects to proceed.  I had a long conversation with him that his aneurysm is now over 12 cm in maximal diameter with no proximal seal of the endograft.  He has no protection from rupture as we discussed today.  His rupture risk in the next year is well over 50%.  I discussed the treatment of his aneurysm here would involve off label physician modified endograft.  Discussed we could use a thoracic endograft that we will modify on the back table and then stent across his renals given dialysis to the bottom level of the SMA to try and get proximal seal.  Ultimately the family would like to further think about options.  States he is 83 years old and not sure he wants more surgery but understands the risk.  I will set up a phone visit next week once they have more time to discuss options.   Cephus Shelling, MD [facility] [place] Office: [phone]

## 2023-05-13 DIAGNOSIS — Z992 Dependence on renal dialysis: Secondary | ICD-10-CM | POA: Diagnosis not present

## 2023-05-13 DIAGNOSIS — N186 End stage renal disease: Secondary | ICD-10-CM | POA: Diagnosis not present

## 2023-05-15 NOTE — Patient Instructions (Signed)
Visit Information  Thank you for taking time to visit with me today. Please don't hesitate to contact me if I can be of assistance to you.   Following are the goals we discussed today:   Goals Addressed             This Visit's Progress    Decrease leg swelling, knowledge of aorta aneurysm- care coordination services       Patient will manage edema/swelling at home- 01/21/23 managing well  Patient will request assistance as needed for signs of depression- 01/21/23 continues to deny symptoms  Patient  Interventions Today    Flowsheet Row Most Recent Value  Chronic Disease   Chronic disease during today's visit Other, Chronic Kidney Disease/End Stage Renal Disease (ESRD)  General Interventions   General Interventions Discussed/Reviewed General Interventions Reviewed, Labs, Sick Day Rules, [employer], Doctor Visits, Communication with  Labs --  [urology testing updated from [facility]]  Doctor Visits Discussed/Reviewed Doctor Visits Reviewed, Specialist  [urology & dialysis discussed - Psychologist, occupational concerns]  PCP/Specialist Visits Compliance with follow-up visit  [Had to cancel recent cardiology visit related to no transportation]  Communication with PCP/Specialists, RN  Merchant navy officer chat to [place] at urology office to have her to outreach to wife,  Left message at [facility] about closure during holiday and possible assist as car repairs are needed Left a message for gentle care transporation to return a call to wife or RN CM]  Exercise Interventions   Exercise Discussed/Reviewed Exercise Reviewed, Physical Activity  Physical Activity Discussed/Reviewed Physical Activity Reviewed  [access mobility Wife confirmed he is able with time to get in & out of a car but has ongoing memory issues]  Education Interventions   Education Provided Provided Education  [insurance transportation benefit, [facility] transportation]  Provided Verbal Education On [employer], Development worker, community   Mental Health Interventions   Mental Health Discussed/Reviewed Mental Health Reviewed, Coping Strategies  Nutrition Interventions   Nutrition Discussed/Reviewed Nutrition Reviewed, Supplemental nutrition, Fluid intake  [confirmed patient with a poor appetite and forgets when he eats (memory changes) evidence by wife finding dishes in the sink in the mornings but patient can not recall getting up and eating and will question if he has eaten throughout the day]  Safety Interventions   Safety Discussed/Reviewed Safety Discussed, Fall Risk, Home Safety  Home Safety Assistive Devices              Our next appointment is by telephone on 05/22/23 at 1100  Please call the care guide team at [phone] if you need to cancel or reschedule your appointment.   If you are experiencing a Mental Health or Behavioral Health Crisis or need someone to talk to, please call the Suicide and Crisis Lifeline: 988 call the [place] National Suicide Prevention Lifeline: 218-735-7574 or TTY: 734 077 4529 TTY (954)872-3782) to talk to a trained counselor call 1-800-273-TALK (toll free, 24 hour hotline) call the [facility]: 680-183-9405 call 911   Patient verbalizes understanding of instructions and care plan provided today and agrees to view in MyChart. Active MyChart status and patient understanding of how to access instructions and care plan via MyChart confirmed with patient.     The patient has been provided with contact information for the care management team and has been advised to call with any health related questions or concerns.    Sivan Quast L. Noelle Penner, RN, BSN, CCM [place]  [facility], [facility] RN Care Manager Direct Dial: [phone]  Fax:  [phone]     

## 2023-05-18 DIAGNOSIS — Z992 Dependence on renal dialysis: Secondary | ICD-10-CM | POA: Diagnosis not present

## 2023-05-18 DIAGNOSIS — N186 End stage renal disease: Secondary | ICD-10-CM | POA: Diagnosis not present

## 2023-05-19 ENCOUNTER — Ambulatory Visit: Admitting: Vascular Surgery

## 2023-05-19 DIAGNOSIS — I7143 Infrarenal abdominal aortic aneurysm, without rupture: Secondary | ICD-10-CM

## 2023-05-19 NOTE — Progress Notes (Signed)
 Virtual Visit via Telephone Note   I connected with Ronald [name] on 05/19/2023 using the [employer] by telephone and verified that I was speaking with the correct person using two identifiers. Patient was located at home and accompanied by wife. I am located at [facility] office.   The limitations of evaluation and management by telemedicine and the availability of in person appointments have been previously discussed with the patient and are documented in the patients chart. The patient expressed understanding and consented to proceed.  PCP: [name], MD  Chief Complaint: Discuss EVAR repair  History of Present Illness: Ronald [name] is a 83 y.o. male with history ESRD, hypertension, hyperlipidemia, CAD status post CABG, AAA repair in [place] for a 6.4 cm AAA that presents for follow-up by phone to discuss repair of his AAA.  Previously seen by Dr. Arbie Cookey on 01/24/2023 with a 10 cm aneurysm with no proximal attachment of his endograft.  He was referred to Dr. Pattricia Boss at [facility].   The patient was under the impression that he did not have any good options at [facility].  The note from Dr. Pattricia Boss suggests the patient will think about options and contact if they elect to proceed.  They wanted more time to discuss options after visit in [place] last week.  Past Medical History:  Diagnosis Date   GERD (gastroesophageal reflux disease)    Glaucoma    Hypertension    Macular degeneration    Myocardial infarction [facility])    1999   Prostate cancer (HCC) 02/2018   Renal insufficiency    Stroke [facility])    no deficits; had stroke while trying to place "stents in legs".    Past Surgical History:  Procedure Laterality Date   ABDOMINAL AORTIC ANEURYSM REPAIR  2012   IN [place]   APPENDECTOMY     AV FISTULA PLACEMENT Left 10/16/2020   Procedure: LEFT ARM ARTERIOVENOUS (AV) FISTULA CREATION;  Surgeon: Larina Earthly, MD;  Location: [facility];  Service: Vascular;  Laterality: Left;   CORONARY  ARTERY BYPASS GRAFT  01/1998   3 vessels 18 years ago   INGUINAL HERNIA REPAIR Right 09/20/2015   Procedure: REPAIR RIGHT INGUINAL HERNIA  WITH MESH;  Surgeon: Ancil Linsey, MD;  Location: [facility];  Service: General;  Laterality: Right;   INSERTION OF DIALYSIS CATHETER Right 08/02/2020   Procedure: INSERTION OF RIGHT TUNNELED INTERNAL JUGULAR  DIALYSIS CATHETER;  Surgeon: Larina Earthly, MD;  Location: [facility];  Service: Vascular;  Laterality: Right;   REMOVAL OF A DIALYSIS CATHETER N/A 03/19/2021   Procedure: MINOR REMOVAL OF A TUNNELED DIALYSIS CATHETER;  Surgeon: Larina Earthly, MD;  Location: [facility];  Service: Vascular;  Laterality: N/A;   tumor removal from bladder      No outpatient medications have been marked as taking for the 05/19/23 encounter (Appointment) with Cephus Shelling, MD.    12 system ROS was negative unless otherwise noted in HPI   Observations/Objective:  CT abdomen pelvis reviewed from 12/16/2022 with a large 12 cm AAA with no proximal attachment with type Ia endoleak of previous endograft   Assessment and Plan:  83 y.o. male, with history ESRD, hypertension, hyperlipidemia, CAD status post CABG, AAA repair in [place] for a 6.4 cm AAA that presents for follow-up by phone to discuss repair of his AAA.  Previously seen by Dr. Arbie Cookey on 01/24/2023 with a 10 cm aneurysm with no proximal attachment of his endograft.  He was referred to Dr. Pattricia Boss at [facility].   I again discussed with his wife that he has a large aneurysm is now over 12 cm in maximal diameter with no proximal seal of the endograft.  He has no protection from rupture as we discussed last week.  His rupture risk in the next year is well over 50%.  I discussed the treatment of his aneurysm here would involve off label physician modified endograft.  Discussed we could use a thoracic endograft that we will modify on the back table and then stent across his renals given dialysis to the bottom level of the SMA to try  and get proximal seal.  Ultimately patient and his wife would like to proceed.  I again discussed risks and benefits including risk of anesthesia, MI, stroke, vessel injury, etc including poor surgical outcome but has limited options at this point.     Follow Up Instructions:   Follow WG:[id number] EVAR   I discussed the assessment and treatment plan with the patient. The patient was provided an opportunity to ask questions and all were answered. The patient agreed with the plan and demonstrated an understanding of the instructions.   The patient was advised to call back or seek an in-person evaluation if the symptoms worsen or if the condition fails to improve as anticipated.  I spent 10 minutes with the patient via telephone encounter.   Signed, Cephus Shelling [facility] [place] Office: [phone]  05/19/2023, 4:26 PM

## 2023-05-20 ENCOUNTER — Other Ambulatory Visit: Payer: Self-pay

## 2023-05-20 DIAGNOSIS — I7143 Infrarenal abdominal aortic aneurysm, without rupture: Secondary | ICD-10-CM

## 2023-05-22 ENCOUNTER — Other Ambulatory Visit: Payer: Self-pay

## 2023-05-22 ENCOUNTER — Ambulatory Visit: Payer: Self-pay | Admitting: *Deleted

## 2023-05-22 ENCOUNTER — Encounter: Payer: Self-pay | Admitting: *Deleted

## 2023-05-22 DIAGNOSIS — Z992 Dependence on renal dialysis: Secondary | ICD-10-CM | POA: Diagnosis not present

## 2023-05-22 DIAGNOSIS — N186 End stage renal disease: Secondary | ICD-10-CM | POA: Diagnosis not present

## 2023-05-22 NOTE — Patient Outreach (Addendum)
 Complex Care Management   Visit Note  05/22/2023  Name:  Ronald [name] MRN: [id number] DOB: 12/23/1940  Situation: Referral received for Complex Care Management related to  2 ED visits on 11/17/23 for Shortness of breath Not feeling well as an End stage renal patient who left hospital PM of NSTEMI, ESRD, Hypertension, CHF, aorta aneurysm  DM  I obtained verbal consent from [facility].  Visit completed with wife, Ronald [name]  on the phone  Background:   Past Medical History:  Diagnosis Date   GERD (gastroesophageal reflux disease)    Glaucoma    Hypertension    Macular degeneration    Myocardial infarction [facility])    1999   Prostate cancer (HCC) 02/2018   Renal insufficiency    Stroke (HCC)    no deficits; had stroke while trying to place "stents in legs".    Assessment: Patient Reported Symptoms:  Cognitive Struggling with memory recall, Confused or disoriented, Able to follow simple commands  Neurological Hearing changes, Vision changes, Weakness    HEENT Change or loss of hearing (keeps tv on really loud)    Cardiovascular Dizziness, Fatigue (after HD)    Respiratory Shortness of breath tonacco abue  Endocrine Blurry vision, Weakness or fatigue    Gastrointestinal Change in appetite    Genitourinary Frequency    Integumentary Other skin lesion  Musculoskeletal Difficulty walking, Unsteady gait, Weakness    Psychosocial  (wife voices concern about the risks for 06/17/23 aorta aneurysm surgery -Dr Chestine Spore)     There were no vitals filed for this visit.  Medications Reviewed Today     Reviewed by Clinton Gallant, RN (Registered Nurse) on 05/22/23 at 2050  Med List Status: <None>   Medication Order Taking? Sig Documenting Provider Last Dose Status Informant  amLODipine (NORVASC) 5 MG tablet [id number] Yes Take 5 mg by mouth at bedtime. [provider]  Active   aspirin EC 81 MG tablet [id number]  Take 1 tablet (81 mg total) by mouth  daily. Swallow whole. Catarina Hartshorn, MD  Active   atorvastatin (LIPITOR) 80 MG tablet [id number]  Take 80 mg by mouth in the morning. [provider]  Active Spouse/Significant Other  calcium acetate (PHOSLO) 667 MG capsule [id number]  Take 1,334 mg by mouth 3 (three) times daily. citracal [provider]  Active Spouse/Significant Other  carvedilol (COREG) 25 MG tablet [id number]  Take 1 tablet (25 mg total) by mouth 2 (two) times daily with a meal. Tat, David, MD  Active   Magnesium 250 MG TABS [id number]  Take 1 tablet by mouth at bedtime. [provider]  Active Spouse/Significant Other  pantoprazole (PROTONIX) 40 MG tablet [id number]  Take 40 mg by mouth daily. [provider]  Active   tamsulosin (FLOMAX) 0.4 MG CAPS capsule [id number]  Take 1 capsule (0.4 mg total) by mouth daily after supper. McKenzie, Mardene Celeste, MD  Active   traMADol Janean Sark) 50 MG tablet [id number]  Take 50 mg by mouth 3 (three) times daily as needed (pain.). [provider]  Active Spouse/Significant Other  zolpidem (AMBIEN) 10 MG tablet [id number]  Take 10 mg by mouth at bedtime. [provider]  Active Spouse/Significant Other            Recommendation:   PCP Follow-up Remote with ear plugs discussed Encouraged wife to speak with Dr Chestine Spore  Follow Up Plan:   Telephone follow up appointment date/time:  06/19/23 3 pm    Emery Dupuy L. Noelle Penner, RN, BSN,  CCM [place]  [facility], [facility] RN Care Manager Direct Dial: [phone]  Fax: [phone]

## 2023-05-22 NOTE — Patient Instructions (Signed)
 Visit Information  Thank you for taking time to visit with me today. Please don't hesitate to contact me if I can be of assistance to you before our next scheduled appointment.  Your next care management appointment is by telephone on 06/19/23  at 3 pm  Please outreach to speak with Dr Chestine Spore again before the 06/17/23 procedure  Please call the care guide team at [phone] if you need to cancel, schedule, or reschedule an appointment.   Please call the Suicide and Crisis Lifeline: 988 call the [place] National Suicide Prevention Lifeline: 660-200-5634 or TTY: (330) 350-0911 TTY 470-074-1650) to talk to a trained counselor call 1-800-273-TALK (toll free, 24 hour hotline) call the [facility]: [phone] call 911 if you are experiencing a Mental Health or Behavioral Health Crisis or need someone to talk to.  Derreck Wiltsey L. Noelle Penner, RN, BSN, CCM [place]  [facility], [facility] RN Care Manager Direct Dial: [phone]  Fax: [phone]

## 2023-05-27 DIAGNOSIS — Z992 Dependence on renal dialysis: Secondary | ICD-10-CM | POA: Diagnosis not present

## 2023-05-27 DIAGNOSIS — N186 End stage renal disease: Secondary | ICD-10-CM | POA: Diagnosis not present

## 2023-05-29 DIAGNOSIS — Z992 Dependence on renal dialysis: Secondary | ICD-10-CM | POA: Diagnosis not present

## 2023-05-29 DIAGNOSIS — N186 End stage renal disease: Secondary | ICD-10-CM | POA: Diagnosis not present

## 2023-06-01 DIAGNOSIS — N186 End stage renal disease: Secondary | ICD-10-CM | POA: Diagnosis not present

## 2023-06-01 DIAGNOSIS — Z992 Dependence on renal dialysis: Secondary | ICD-10-CM | POA: Diagnosis not present

## 2023-06-05 DIAGNOSIS — N186 End stage renal disease: Secondary | ICD-10-CM | POA: Diagnosis not present

## 2023-06-05 DIAGNOSIS — Z992 Dependence on renal dialysis: Secondary | ICD-10-CM | POA: Diagnosis not present

## 2023-06-08 DIAGNOSIS — N186 End stage renal disease: Secondary | ICD-10-CM | POA: Diagnosis not present

## 2023-06-08 DIAGNOSIS — Z992 Dependence on renal dialysis: Secondary | ICD-10-CM | POA: Diagnosis not present

## 2023-06-09 NOTE — Pre-Procedure Instructions (Signed)
 Surgical Instructions   Your procedure is scheduled on Wednesday, May 7th. Report to [facility] Main Entrance "A" at 06:30 A.M., then check in with the Admitting office. Any questions or running late day of surgery: call [phone]  Questions prior to your surgery date: call [phone], Monday-Friday, 8am-4pm. If you experience any cold or flu symptoms such as cough, fever, chills, shortness of breath, etc. between now and your scheduled surgery, please notify us  at the above number.     Remember:  Do not eat or drink after midnight the night before your surgery    Take these medicines the morning of surgery with A SIP OF WATER  aspirin   atorvastatin  (LIPITOR)  carvedilol  (COREG )  pantoprazole (PROTONIX)   May take these medicines IF NEEDED: traMADol  (ULTRAM )    One week prior to surgery, STOP taking any Aspirin  (unless otherwise instructed by your surgeon) Aleve, Naproxen, Ibuprofen, Motrin, Advil, Goody's, BC's, all herbal medications, fish oil, and non-prescription vitamins.                     Do NOT Smoke (Tobacco/Vaping) for 24 hours prior to your procedure.  If you use a CPAP at night, you may bring your mask/headgear for your overnight stay.   You will be asked to remove any contacts, glasses, piercing's, hearing aid's, dentures/partials prior to surgery. Please bring cases for these items if needed.    Patients discharged the day of surgery will not be allowed to drive home, and someone needs to stay with them for 24 hours.  SURGICAL WAITING ROOM VISITATION Patients may have no more than 2 support people in the waiting area - these visitors may rotate.   Pre-op nurse will coordinate an appropriate time for 1 ADULT support person, who may not rotate, to accompany patient in pre-op.  Children under the age of 44 must have an adult with them who is not the patient and must remain in the main waiting area with an adult.  If the patient needs to stay at the hospital  during part of their recovery, the visitor guidelines for inpatient rooms apply.  Please refer to the [facility] website for the visitor guidelines for any additional information.   If you received a COVID test during your pre-op visit  it is requested that you wear a mask when out in public, stay away from anyone that may not be feeling well and notify your surgeon if you develop symptoms. If you have been in contact with anyone that has tested positive in the last 10 days please notify you surgeon.      Pre-operative CHG Bathing Instructions   You can play a key role in reducing the risk of infection after surgery. Your skin needs to be as free of germs as possible. You can reduce the number of germs on your skin by washing with CHG (chlorhexidine  gluconate) soap before surgery. CHG is an antiseptic soap that kills germs and continues to kill germs even after washing.   DO NOT use if you have an allergy to chlorhexidine /CHG or antibacterial soaps. If your skin becomes reddened or irritated, stop using the CHG and notify one of our RNs at [phone].              TAKE A SHOWER THE NIGHT BEFORE SURGERY AND THE DAY OF SURGERY    Please keep in mind the following:  DO NOT shave, including legs and underarms, 48 hours prior to surgery.   You  may shave your face before/day of surgery.  Place clean sheets on your bed the night before surgery Use a clean washcloth (not used since being washed) for each shower. DO NOT sleep with pet's night before surgery.  CHG Shower Instructions:  Wash your face and private area with normal soap. If you choose to wash your hair, wash first with your normal shampoo.  After you use shampoo/soap, rinse your hair and body thoroughly to remove shampoo/soap residue.  Turn the water OFF and apply half the bottle of CHG soap to a CLEAN washcloth.  Apply CHG soap ONLY FROM YOUR NECK DOWN TO YOUR TOES (washing for 3-5 minutes)  DO NOT use CHG soap on face, private  areas, open wounds, or sores.  Pay special attention to the area where your surgery is being performed.  If you are having back surgery, having someone wash your back for you may be helpful. Wait 2 minutes after CHG soap is applied, then you may rinse off the CHG soap.  Pat dry with a clean towel  Put on clean pajamas    Additional instructions for the day of surgery: DO NOT APPLY any lotions, deodorants, cologne, or perfumes.   Do not wear jewelry or makeup Do not wear nail polish, gel polish, artificial nails, or any other type of covering on natural nails (fingers and toes) Do not bring valuables to the hospital. [facility] is not responsible for valuables/personal belongings. Put on clean/comfortable clothes.  Please brush your teeth.  Ask your nurse before applying any prescription medications to the skin.

## 2023-06-10 ENCOUNTER — Encounter (HOSPITAL_COMMUNITY): Payer: Self-pay

## 2023-06-10 ENCOUNTER — Encounter (HOSPITAL_COMMUNITY)
Admission: RE | Admit: 2023-06-10 | Discharge: 2023-06-10 | Disposition: A | Discharge status: Home / Self Care | Source: Ambulatory Visit | Attending: Vascular Surgery | Admitting: Vascular Surgery

## 2023-06-10 ENCOUNTER — Other Ambulatory Visit: Payer: Self-pay

## 2023-06-10 VITALS — BP 150/73 | HR 76 | Temp 97.8°F | Resp 17 | Ht 74.0 in | Wt 209.4 lb

## 2023-06-10 DIAGNOSIS — I120 Hypertensive chronic kidney disease with stage 5 chronic kidney disease or end stage renal disease: Secondary | ICD-10-CM | POA: Diagnosis not present

## 2023-06-10 DIAGNOSIS — Z992 Dependence on renal dialysis: Secondary | ICD-10-CM | POA: Diagnosis not present

## 2023-06-10 DIAGNOSIS — Z01818 Encounter for other preprocedural examination: Secondary | ICD-10-CM

## 2023-06-10 DIAGNOSIS — Z87891 Personal history of nicotine dependence: Secondary | ICD-10-CM | POA: Diagnosis not present

## 2023-06-10 DIAGNOSIS — N186 End stage renal disease: Secondary | ICD-10-CM | POA: Diagnosis not present

## 2023-06-10 DIAGNOSIS — Z01812 Encounter for preprocedural laboratory examination: Secondary | ICD-10-CM | POA: Diagnosis not present

## 2023-06-10 DIAGNOSIS — E785 Hyperlipidemia, unspecified: Secondary | ICD-10-CM | POA: Insufficient documentation

## 2023-06-10 DIAGNOSIS — I7143 Infrarenal abdominal aortic aneurysm, without rupture: Secondary | ICD-10-CM | POA: Insufficient documentation

## 2023-06-10 DIAGNOSIS — I2510 Atherosclerotic heart disease of native coronary artery without angina pectoris: Secondary | ICD-10-CM | POA: Diagnosis not present

## 2023-06-10 DIAGNOSIS — Z951 Presence of aortocoronary bypass graft: Secondary | ICD-10-CM | POA: Insufficient documentation

## 2023-06-10 DIAGNOSIS — Z8673 Personal history of transient ischemic attack (TIA), and cerebral infarction without residual deficits: Secondary | ICD-10-CM | POA: Insufficient documentation

## 2023-06-10 HISTORY — DX: Heart failure, unspecified: I50.9

## 2023-06-10 LAB — COMPREHENSIVE METABOLIC PANEL WITH GFR
ALT: 8 U/L (ref 0–44)
AST: 13 U/L — ABNORMAL LOW (ref 15–41)
Albumin: 3.2 g/dL — ABNORMAL LOW (ref 3.5–5.0)
Alkaline Phosphatase: 81 U/L (ref 38–126)
Anion gap: 13 (ref 5–15)
BUN: 34 mg/dL — ABNORMAL HIGH (ref 8–23)
CO2: 25 mmol/L (ref 22–32)
Calcium: 8.6 mg/dL — ABNORMAL LOW (ref 8.9–10.3)
Chloride: 94 mmol/L — ABNORMAL LOW (ref 98–111)
Creatinine, Ser: 7.13 mg/dL — ABNORMAL HIGH (ref 0.61–1.24)
GFR, Estimated: 7 mL/min — ABNORMAL LOW (ref >60)
Glucose, Bld: 89 mg/dL (ref 70–99)
Potassium: 4.1 mmol/L (ref 3.5–5.1)
Sodium: 132 mmol/L — ABNORMAL LOW (ref 135–145)
Total Bilirubin: 0.6 mg/dL (ref 0.0–1.2)
Total Protein: 6.8 g/dL (ref 6.5–8.1)

## 2023-06-10 LAB — URINALYSIS, ROUTINE W REFLEX MICROSCOPIC
Bilirubin Urine: NEGATIVE
Glucose, UA: NEGATIVE mg/dL
Hgb urine dipstick: NEGATIVE
Ketones, ur: NEGATIVE mg/dL
Leukocytes,Ua: NEGATIVE
Nitrite: NEGATIVE
Protein, ur: 100 mg/dL — AB
Specific Gravity, Urine: 1.006 (ref 1.005–1.030)
pH: 7 (ref 5.0–8.0)

## 2023-06-10 LAB — CBC
HCT: 36.3 % — ABNORMAL LOW (ref 39.0–52.0)
Hemoglobin: 11.5 g/dL — ABNORMAL LOW (ref 13.0–17.0)
MCH: 29.6 pg (ref 26.0–34.0)
MCHC: 31.7 g/dL (ref 30.0–36.0)
MCV: 93.3 fL (ref 80.0–100.0)
Platelets: 231 10*3/uL (ref 150–400)
RBC: 3.89 MIL/uL — ABNORMAL LOW (ref 4.22–5.81)
RDW: 17.6 % — ABNORMAL HIGH (ref 11.5–15.5)
WBC: 8.3 10*3/uL (ref 4.0–10.5)
nRBC: 0 % (ref 0.0–0.2)

## 2023-06-10 LAB — SURGICAL PCR SCREEN
MRSA, PCR: NEGATIVE
Staphylococcus aureus: POSITIVE — AB

## 2023-06-10 LAB — PROTIME-INR
INR: 1.1 (ref 0.8–1.2)
Prothrombin Time: 14.2 s (ref 11.4–15.2)

## 2023-06-10 LAB — APTT: aPTT: 35 s (ref 24–36)

## 2023-06-10 NOTE — Progress Notes (Addendum)
 PCP - Omie Bickers, MD  Cardiologist - Janelle Mediate, MD   PPM/ICD -  Device Orders -  Rep Notified -   Chest x-ray -  EKG - 11-12-22 Stress Test - 09-12-20 ECHO - 11-13-22 Cardiac Cath - 2009  Sleep Study - denies CPAP - n/a  DM - denies  Blood Thinner Instructions: denies Aspirin  Instructions: instructed patient to follow instructions given to you by surgeon  ERAS Protcol - NPO  COVID TEST- n/a  Dialysis Monday and Friday  Anesthesia review: history ESRD, hypertension, hyperlipidemia, CAD status post CABG, AAA , MI  Patient denies shortness of breath, fever, cough and chest pain at PAT appointment   All instructions explained to the patient, with a verbal understanding of the material. Patient agrees to go over the instructions while at home for a better understanding. Patient also instructed to self quarantine after being tested for COVID-19. The opportunity to ask questions was provided.

## 2023-06-11 NOTE — Anesthesia Preprocedure Evaluation (Signed)
 Anesthesia Evaluation  Patient identified by MRN, date of birth, ID band Patient awake    Reviewed: Allergy & Precautions, NPO status , Patient's Chart, lab work & pertinent test results, reviewed documented beta blocker date and time   History of Anesthesia Complications Negative for: history of anesthetic complications  Airway Mallampati: III  TM Distance: >3 FB     Dental  (+) Poor Dentition, Chipped, Loose   Pulmonary COPD, Patient abstained from smoking., former smoker   breath sounds clear to auscultation       Cardiovascular hypertension, + CAD, + Past MI, + Cardiac Stents, + CABG and +CHF  (-) pacemaker(-) Cardiac Defibrillator + Valvular Problems/Murmurs (mod AS) AS  Rhythm:Regular Rate:Normal + Systolic murmurs 1. Left ventricular ejection fraction, by estimation, is 40 to 45%. The  left ventricle has mildly decreased function. The left ventricle  demonstrates global hypokinesis. There is mild left ventricular  hypertrophy. Left ventricular diastolic parameters  are consistent with Grade II diastolic dysfunction (pseudonormalization).  Elevated left atrial pressure.   2. Right ventricular systolic function is normal. The right ventricular  size is normal. Tricuspid regurgitation signal is inadequate for assessing  PA pressure.   3. Left atrial size was severely dilated.   4. Right atrial size was mildly dilated.   5. The mitral valve is abnormal. Mild mitral valve regurgitation. No  evidence of mitral stenosis.   6. The tricuspid valve is abnormal.   7. The aortic valve has an indeterminant number of cusps. There is  moderate calcification of the aortic valve. There is moderate thickening  of the aortic valve. Aortic valve regurgitation is not visualized.  Moderate aortic valve stenosis. Aortic valve  mean gradient measures 18.8 mmHg. Aortic valve peak gradient measures 44.4  mmHg. Aortic valve area, by VTI measures  1.10 cm. DI 0.29   8. The inferior vena cava is dilated in size with <50% respiratory  variability, suggesting right atrial pressure of 15 mmHg.      Neuro/Psych CVA, No Residual Symptoms    GI/Hepatic ,GERD  ,,  Endo/Other    Renal/GU ESRF and DialysisRenal disease     Musculoskeletal   Abdominal   Peds  Hematology  (+) Blood dyscrasia, anemia Hgb 11.5   Anesthesia Other Findings   Reproductive/Obstetrics                              Anesthesia Physical Anesthesia Plan  ASA: 4  Anesthesia Plan: General   Post-op Pain Management:    Induction:   PONV Risk Score and Plan: 1 and Ondansetron  and Dexamethasone  Airway Management Planned: Oral ETT  Additional Equipment: Arterial line  Intra-op Plan:   Post-operative Plan: Extubation in OR  Informed Consent: I have reviewed the patients History and Physical, chart, labs and discussed the procedure including the risks, benefits and alternatives for the proposed anesthesia with the patient or authorized representative who has indicated his/her understanding and acceptance.     Dental advisory given  Plan Discussed with: CRNA  Anesthesia Plan Comments: (PAT note by Rudy Costain, PA-C: 83 yo male follows with cardiology for hx of HTN, HLD, CVA, CAD status post CABG 1999 cath 2009 LIMA patent, SVG to the circumflex patent and native circumflex to RCA collaterals with total occlusion of native coronary arteries. TTE 11/13/22 EF 40-45% moderate AS mean gradient 18.8 peak 44.4 mmHg AVA 1.2 cm2. Myovue 09/12/20 fixed defect in mid/apical anteroseptal wall and apical  to basal inferior lateral wall no ischemia EF poorly estimated 31% frequent PAC short bursts of SVT - was not taking coreg  at time. Last seen by Dr. Stann Earnest 02/24/23, stable from cardiac standpoint but noted to have some worsening of cognitive decline. Also discussed need to followup with vascular surgery for enlarging AAA.  Follows with  vascular surgery for hx of endovascular repair stent graft 2013 in [place]  with endoleak/residual AAA last CT 12/31/22 done for prostate cancer noted enlarging AAA 11 x 12.8 cm. Seen by Dr. Fulton Job 05/19/23. Per note, "I again discussed with his wife that he has a large aneurysm is now over 12 cm in maximal diameter with no proximal seal of the endograft.  He has no protection from rupture as we discussed last week.  His rupture risk in the next year is well over 50%.  I discussed the treatment of his aneurysm here would involve off label physician modified endograft.  Discussed we could use a thoracic endograft that we will modify on the back table and then stent across his renals given dialysis to the bottom level of the SMA to try and get proximal seal.  Ultimately patient and his wife would like to proceed. I again discussed risks and benefits including risk of anesthesia, MI, stroke, vessel injury, etc including poor surgical outcome but has limited options at this point. "  Former smoker, 33 pack years. ESRD on HD Monday-Friday via LUE fistula.  Preop labs reviewed, mild hyponatremia with sodium 132, creatinine elvated c/w ESRD, mild anemia Hgb 11.5  TTE 11/13/22: 1. Left ventricular ejection fraction, by estimation, is 40 to 45%. The  left ventricle has mildly decreased function. The left ventricle  demonstrates global hypokinesis. There is mild left ventricular  hypertrophy. Left ventricular diastolic parameters  are consistent with Grade II diastolic dysfunction (pseudonormalization).  Elevated left atrial pressure.   2. Right ventricular systolic function is normal. The right ventricular  size is normal. Tricuspid regurgitation signal is inadequate for assessing  PA pressure.   3. Left atrial size was severely dilated.   4. Right atrial size was mildly dilated.   5. The mitral valve is abnormal. Mild mitral valve regurgitation. No  evidence of mitral stenosis.   6. The tricuspid valve is  abnormal.   7. The aortic valve has an indeterminant number of cusps. There is  moderate calcification of the aortic valve. There is moderate thickening  of the aortic valve. Aortic valve regurgitation is not visualized.  Moderate aortic valve stenosis. Aortic valve  mean gradient measures 18.8 mmHg. Aortic valve peak gradient measures 44.4  mmHg. Aortic valve area, by VTI measures 1.10 cm. DI 0.29   8. The inferior vena cava is dilated in size with <50% respiratory  variability, suggesting right atrial pressure of 15 mmHg.   Carotid ultrasound 07/23/21: IMPRESSION: Right:   Color duplex indicates moderate heterogeneous and calcified plaque, with no hemodynamically significant stenosis by duplex criteria in the extracranial cerebrovascular circulation.   Left:   Heterogeneous and partially calcified plaque at the left carotid bifurcation contributes to 50%-69% stenosis by established duplex Criteria  Nuclear stress 09/12/20:  No diagnostic ST segment changes to indicate ischemia. Frequent PACs and bursts of SVT/atrial tachycardia noted throughout the study. Patient asymptomatic in terms of palpitations.  Small to medium sized, moderate intensity, fixed defects involving the mid to apical anteroseptal wall and apical to basal inferolateral wall. No large ischemic territories are demonstrated. Most consistent with infarct scar.  This is  a high risk study based on calculated LVEF.  Nuclear stress EF: 31%. Suggest echocardiogram for more complete assessment of LVEF and possibility of ischemic cardiomyopathy  )         Anesthesia Quick Evaluation

## 2023-06-11 NOTE — Progress Notes (Signed)
 Anesthesia Chart Review:  83 yo male follows with cardiology for hx of HTN, HLD, CVA, CAD status post CABG 1999 cath 2009 LIMA patent, SVG to the circumflex patent and native circumflex to RCA collaterals with total occlusion of native coronary arteries. TTE 11/13/22 EF 40-45% moderate AS mean gradient 18.8 peak 44.4 mmHg AVA 1.2 cm2. Myovue 09/12/20 fixed defect in mid/apical anteroseptal wall and apical to basal inferior lateral wall no ischemia EF poorly estimated 31% frequent PAC short bursts of SVT - was not taking coreg  at time. Last seen by Dr. Stann Earnest 02/24/23, stable from cardiac standpoint but noted to have some worsening of cognitive decline. Also discussed need to followup with vascular surgery for enlarging AAA.  Follows with vascular surgery for hx of endovascular repair stent graft 2013 in [place]  with endoleak/residual AAA last CT 12/31/22 done for prostate cancer noted enlarging AAA 11 x 12.8 cm. Seen by Dr. Fulton Job 05/19/23. Per note, "I again discussed with his wife that he has a large aneurysm is now over 12 cm in maximal diameter with no proximal seal of the endograft.  He has no protection from rupture as we discussed last week.  His rupture risk in the next year is well over 50%.  I discussed the treatment of his aneurysm here would involve off label physician modified endograft.  Discussed we could use a thoracic endograft that we will modify on the back table and then stent across his renals given dialysis to the bottom level of the SMA to try and get proximal seal.  Ultimately patient and his wife would like to proceed. I again discussed risks and benefits including risk of anesthesia, MI, stroke, vessel injury, etc including poor surgical outcome but has limited options at this point. "  Former smoker, 33 pack years. ESRD on HD Monday-Friday via LUE fistula.  Preop labs reviewed, mild hyponatremia with sodium 132, creatinine elvated c/w ESRD, mild anemia Hgb 11.5  TTE 11/13/22: 1. Left  ventricular ejection fraction, by estimation, is 40 to 45%. The  left ventricle has mildly decreased function. The left ventricle  demonstrates global hypokinesis. There is mild left ventricular  hypertrophy. Left ventricular diastolic parameters  are consistent with Grade II diastolic dysfunction (pseudonormalization).  Elevated left atrial pressure.   2. Right ventricular systolic function is normal. The right ventricular  size is normal. Tricuspid regurgitation signal is inadequate for assessing  PA pressure.   3. Left atrial size was severely dilated.   4. Right atrial size was mildly dilated.   5. The mitral valve is abnormal. Mild mitral valve regurgitation. No  evidence of mitral stenosis.   6. The tricuspid valve is abnormal.   7. The aortic valve has an indeterminant number of cusps. There is  moderate calcification of the aortic valve. There is moderate thickening  of the aortic valve. Aortic valve regurgitation is not visualized.  Moderate aortic valve stenosis. Aortic valve  mean gradient measures 18.8 mmHg. Aortic valve peak gradient measures 44.4  mmHg. Aortic valve area, by VTI measures 1.10 cm. DI 0.29   8. The inferior vena cava is dilated in size with <50% respiratory  variability, suggesting right atrial pressure of 15 mmHg.   Carotid ultrasound 07/23/21: IMPRESSION: Right:   Color duplex indicates moderate heterogeneous and calcified plaque, with no hemodynamically significant stenosis by duplex criteria in the extracranial cerebrovascular circulation.   Left:   Heterogeneous and partially calcified plaque at the left carotid bifurcation contributes to 50%-69% stenosis by established duplex  Criteria  Nuclear stress 09/12/20: No diagnostic ST segment changes to indicate ischemia. Frequent PACs and bursts of SVT/atrial tachycardia noted throughout the study. Patient asymptomatic in terms of palpitations. Small to medium sized, moderate intensity, fixed defects  involving the mid to apical anteroseptal wall and apical to basal inferolateral wall. No large ischemic territories are demonstrated. Most consistent with infarct scar. This is a high risk study based on calculated LVEF. Nuclear stress EF: 31%. Suggest echocardiogram for more complete assessment of LVEF and possibility of ischemic cardiomyopathy    Edilia Gordon [facility] Short Stay Center/Anesthesiology Phone [phone] 06/11/2023 3:06 PM

## 2023-06-12 DIAGNOSIS — Z992 Dependence on renal dialysis: Secondary | ICD-10-CM | POA: Diagnosis not present

## 2023-06-12 DIAGNOSIS — N186 End stage renal disease: Secondary | ICD-10-CM | POA: Diagnosis not present

## 2023-06-15 DIAGNOSIS — N186 End stage renal disease: Secondary | ICD-10-CM | POA: Diagnosis not present

## 2023-06-15 DIAGNOSIS — Z992 Dependence on renal dialysis: Secondary | ICD-10-CM | POA: Diagnosis not present

## 2023-06-16 ENCOUNTER — Other Ambulatory Visit

## 2023-06-16 DIAGNOSIS — C61 Malignant neoplasm of prostate: Secondary | ICD-10-CM

## 2023-06-17 ENCOUNTER — Other Ambulatory Visit: Payer: Self-pay

## 2023-06-17 ENCOUNTER — Inpatient Hospital Stay (HOSPITAL_COMMUNITY)

## 2023-06-17 ENCOUNTER — Inpatient Hospital Stay (HOSPITAL_COMMUNITY)
Admission: RE | Admit: 2023-06-17 | Discharge: 2023-06-23 | DRG: 268 | Disposition: A | Discharge status: Home Health | Attending: Vascular Surgery | Admitting: Vascular Surgery

## 2023-06-17 ENCOUNTER — Inpatient Hospital Stay (HOSPITAL_COMMUNITY): Payer: Self-pay | Admitting: Physician Assistant

## 2023-06-17 ENCOUNTER — Inpatient Hospital Stay (HOSPITAL_COMMUNITY): Admitting: Registered Nurse

## 2023-06-17 ENCOUNTER — Encounter (HOSPITAL_COMMUNITY): Payer: Self-pay | Admitting: Vascular Surgery

## 2023-06-17 ENCOUNTER — Other Ambulatory Visit: Payer: 59

## 2023-06-17 ENCOUNTER — Encounter (HOSPITAL_COMMUNITY)
Admission: RE | Disposition: A | Discharge status: Home Health | Payer: Self-pay | Source: Home / Self Care | Attending: Vascular Surgery

## 2023-06-17 DIAGNOSIS — I480 Paroxysmal atrial fibrillation: Secondary | ICD-10-CM | POA: Diagnosis not present

## 2023-06-17 DIAGNOSIS — I132 Hypertensive heart and chronic kidney disease with heart failure and with stage 5 chronic kidney disease, or end stage renal disease: Secondary | ICD-10-CM | POA: Diagnosis not present

## 2023-06-17 DIAGNOSIS — I5042 Chronic combined systolic (congestive) and diastolic (congestive) heart failure: Secondary | ICD-10-CM | POA: Diagnosis present

## 2023-06-17 DIAGNOSIS — Z9889 Other specified postprocedural states: Secondary | ICD-10-CM

## 2023-06-17 DIAGNOSIS — I7143 Infrarenal abdominal aortic aneurysm, without rupture: Secondary | ICD-10-CM

## 2023-06-17 DIAGNOSIS — I452 Bifascicular block: Secondary | ICD-10-CM | POA: Diagnosis present

## 2023-06-17 DIAGNOSIS — I959 Hypotension, unspecified: Secondary | ICD-10-CM | POA: Diagnosis not present

## 2023-06-17 DIAGNOSIS — J90 Pleural effusion, not elsewhere classified: Secondary | ICD-10-CM | POA: Diagnosis not present

## 2023-06-17 DIAGNOSIS — Z8546 Personal history of malignant neoplasm of prostate: Secondary | ICD-10-CM

## 2023-06-17 DIAGNOSIS — E875 Hyperkalemia: Secondary | ICD-10-CM | POA: Diagnosis not present

## 2023-06-17 DIAGNOSIS — Z8679 Personal history of other diseases of the circulatory system: Secondary | ICD-10-CM | POA: Diagnosis not present

## 2023-06-17 DIAGNOSIS — I708 Atherosclerosis of other arteries: Secondary | ICD-10-CM | POA: Diagnosis present

## 2023-06-17 DIAGNOSIS — D62 Acute posthemorrhagic anemia: Secondary | ICD-10-CM | POA: Diagnosis not present

## 2023-06-17 DIAGNOSIS — Z992 Dependence on renal dialysis: Secondary | ICD-10-CM

## 2023-06-17 DIAGNOSIS — F17210 Nicotine dependence, cigarettes, uncomplicated: Secondary | ICD-10-CM | POA: Diagnosis present

## 2023-06-17 DIAGNOSIS — R4189 Other symptoms and signs involving cognitive functions and awareness: Secondary | ICD-10-CM | POA: Diagnosis present

## 2023-06-17 DIAGNOSIS — I10 Essential (primary) hypertension: Secondary | ICD-10-CM

## 2023-06-17 DIAGNOSIS — E785 Hyperlipidemia, unspecified: Secondary | ICD-10-CM | POA: Diagnosis present

## 2023-06-17 DIAGNOSIS — Z79899 Other long term (current) drug therapy: Secondary | ICD-10-CM

## 2023-06-17 DIAGNOSIS — D649 Anemia, unspecified: Secondary | ICD-10-CM | POA: Diagnosis not present

## 2023-06-17 DIAGNOSIS — I4819 Other persistent atrial fibrillation: Secondary | ICD-10-CM

## 2023-06-17 DIAGNOSIS — I5022 Chronic systolic (congestive) heart failure: Secondary | ICD-10-CM

## 2023-06-17 DIAGNOSIS — I350 Nonrheumatic aortic (valve) stenosis: Secondary | ICD-10-CM | POA: Diagnosis not present

## 2023-06-17 DIAGNOSIS — T82330A Leakage of aortic (bifurcation) graft (replacement), initial encounter: Secondary | ICD-10-CM

## 2023-06-17 DIAGNOSIS — I509 Heart failure, unspecified: Secondary | ICD-10-CM | POA: Diagnosis not present

## 2023-06-17 DIAGNOSIS — I4891 Unspecified atrial fibrillation: Secondary | ICD-10-CM

## 2023-06-17 DIAGNOSIS — H3530 Unspecified macular degeneration: Secondary | ICD-10-CM | POA: Diagnosis present

## 2023-06-17 DIAGNOSIS — N400 Enlarged prostate without lower urinary tract symptoms: Secondary | ICD-10-CM | POA: Diagnosis present

## 2023-06-17 DIAGNOSIS — Z7902 Long term (current) use of antithrombotics/antiplatelets: Secondary | ICD-10-CM

## 2023-06-17 DIAGNOSIS — I5020 Unspecified systolic (congestive) heart failure: Secondary | ICD-10-CM | POA: Diagnosis not present

## 2023-06-17 DIAGNOSIS — F03A11 Unspecified dementia, mild, with agitation: Secondary | ICD-10-CM | POA: Diagnosis not present

## 2023-06-17 DIAGNOSIS — N186 End stage renal disease: Secondary | ICD-10-CM

## 2023-06-17 DIAGNOSIS — F05 Delirium due to known physiological condition: Secondary | ICD-10-CM | POA: Diagnosis not present

## 2023-06-17 DIAGNOSIS — Z8673 Personal history of transient ischemic attack (TIA), and cerebral infarction without residual deficits: Secondary | ICD-10-CM

## 2023-06-17 DIAGNOSIS — I2510 Atherosclerotic heart disease of native coronary artery without angina pectoris: Secondary | ICD-10-CM

## 2023-06-17 DIAGNOSIS — D696 Thrombocytopenia, unspecified: Secondary | ICD-10-CM | POA: Diagnosis not present

## 2023-06-17 DIAGNOSIS — K683 Retroperitoneal hematoma: Secondary | ICD-10-CM | POA: Diagnosis not present

## 2023-06-17 DIAGNOSIS — N250 Renal osteodystrophy: Secondary | ICD-10-CM | POA: Diagnosis not present

## 2023-06-17 DIAGNOSIS — E871 Hypo-osmolality and hyponatremia: Secondary | ICD-10-CM | POA: Diagnosis present

## 2023-06-17 DIAGNOSIS — I714 Abdominal aortic aneurysm, without rupture, unspecified: Secondary | ICD-10-CM | POA: Diagnosis not present

## 2023-06-17 DIAGNOSIS — R599 Enlarged lymph nodes, unspecified: Secondary | ICD-10-CM | POA: Diagnosis not present

## 2023-06-17 DIAGNOSIS — Y712 Prosthetic and other implants, materials and accessory cardiovascular devices associated with adverse incidents: Secondary | ICD-10-CM | POA: Diagnosis present

## 2023-06-17 DIAGNOSIS — K219 Gastro-esophageal reflux disease without esophagitis: Secondary | ICD-10-CM | POA: Diagnosis present

## 2023-06-17 DIAGNOSIS — H409 Unspecified glaucoma: Secondary | ICD-10-CM | POA: Diagnosis present

## 2023-06-17 DIAGNOSIS — Z9181 History of falling: Secondary | ICD-10-CM

## 2023-06-17 DIAGNOSIS — T82310A Breakdown (mechanical) of aortic (bifurcation) graft (replacement), initial encounter: Principal | ICD-10-CM | POA: Diagnosis present

## 2023-06-17 DIAGNOSIS — Z87891 Personal history of nicotine dependence: Secondary | ICD-10-CM

## 2023-06-17 DIAGNOSIS — Z951 Presence of aortocoronary bypass graft: Secondary | ICD-10-CM | POA: Diagnosis not present

## 2023-06-17 DIAGNOSIS — D631 Anemia in chronic kidney disease: Secondary | ICD-10-CM | POA: Diagnosis present

## 2023-06-17 DIAGNOSIS — I70201 Unspecified atherosclerosis of native arteries of extremities, right leg: Secondary | ICD-10-CM | POA: Diagnosis not present

## 2023-06-17 DIAGNOSIS — Z7982 Long term (current) use of aspirin: Secondary | ICD-10-CM

## 2023-06-17 DIAGNOSIS — R296 Repeated falls: Secondary | ICD-10-CM | POA: Diagnosis present

## 2023-06-17 DIAGNOSIS — K4090 Unilateral inguinal hernia, without obstruction or gangrene, not specified as recurrent: Secondary | ICD-10-CM | POA: Diagnosis not present

## 2023-06-17 DIAGNOSIS — I252 Old myocardial infarction: Secondary | ICD-10-CM

## 2023-06-17 DIAGNOSIS — R531 Weakness: Secondary | ICD-10-CM | POA: Diagnosis not present

## 2023-06-17 DIAGNOSIS — Z781 Physical restraint status: Secondary | ICD-10-CM

## 2023-06-17 LAB — POCT I-STAT 7, (LYTES, BLD GAS, ICA,H+H)
Acid-base deficit: 4 mmol/L — ABNORMAL HIGH (ref 0.0–2.0)
Acid-base deficit: 5 mmol/L — ABNORMAL HIGH (ref 0.0–2.0)
Acid-base deficit: 6 mmol/L — ABNORMAL HIGH (ref 0.0–2.0)
Acid-base deficit: 7 mmol/L — ABNORMAL HIGH (ref 0.0–2.0)
Acid-base deficit: 8 mmol/L — ABNORMAL HIGH (ref 0.0–2.0)
Bicarbonate: 22.4 mmol/L (ref 20.0–28.0)
Bicarbonate: 21.1 mmol/L (ref 20.0–28.0)
Bicarbonate: 20.4 mmol/L (ref 20.0–28.0)
Bicarbonate: 19.6 mmol/L — ABNORMAL LOW (ref 20.0–28.0)
Bicarbonate: 19.6 mmol/L — ABNORMAL LOW (ref 20.0–28.0)
Calcium, Ion: 1.11 mmol/L — ABNORMAL LOW (ref 1.15–1.40)
Calcium, Ion: 1.07 mmol/L — ABNORMAL LOW (ref 1.15–1.40)
Calcium, Ion: 1.05 mmol/L — ABNORMAL LOW (ref 1.15–1.40)
Calcium, Ion: 0.99 mmol/L — ABNORMAL LOW (ref 1.15–1.40)
Calcium, Ion: 0.99 mmol/L — ABNORMAL LOW (ref 1.15–1.40)
HCT: 28 % — ABNORMAL LOW (ref 39.0–52.0)
HCT: 27 % — ABNORMAL LOW (ref 39.0–52.0)
HCT: 19 % — ABNORMAL LOW (ref 39.0–52.0)
HCT: 22 % — ABNORMAL LOW (ref 39.0–52.0)
HCT: 23 % — ABNORMAL LOW (ref 39.0–52.0)
Hemoglobin: 9.5 g/dL — ABNORMAL LOW (ref 13.0–17.0)
Hemoglobin: 9.2 g/dL — ABNORMAL LOW (ref 13.0–17.0)
Hemoglobin: 6.5 g/dL — CL (ref 13.0–17.0)
Hemoglobin: 7.5 g/dL — ABNORMAL LOW (ref 13.0–17.0)
Hemoglobin: 7.8 g/dL — ABNORMAL LOW (ref 13.0–17.0)
O2 Saturation: 99 %
O2 Saturation: 99 %
O2 Saturation: 98 %
O2 Saturation: 98 %
O2 Saturation: 99 %
Patient temperature: 34.9
Patient temperature: 34.8
Patient temperature: 34.9
Patient temperature: 35.1
Patient temperature: 35.2
Potassium: 4.7 mmol/L (ref 3.5–5.1)
Potassium: 5.7 mmol/L — ABNORMAL HIGH (ref 3.5–5.1)
Potassium: 6.1 mmol/L — ABNORMAL HIGH (ref 3.5–5.1)
Potassium: 6.7 mmol/L (ref 3.5–5.1)
Potassium: 6.7 mmol/L (ref 3.5–5.1)
Sodium: 134 mmol/L — ABNORMAL LOW (ref 135–145)
Sodium: 131 mmol/L — ABNORMAL LOW (ref 135–145)
Sodium: 132 mmol/L — ABNORMAL LOW (ref 135–145)
Sodium: 131 mmol/L — ABNORMAL LOW (ref 135–145)
Sodium: 132 mmol/L — ABNORMAL LOW (ref 135–145)
TCO2: 24 mmol/L (ref 22–32)
TCO2: 23 mmol/L (ref 22–32)
TCO2: 22 mmol/L (ref 22–32)
TCO2: 21 mmol/L — ABNORMAL LOW (ref 22–32)
TCO2: 21 mmol/L — ABNORMAL LOW (ref 22–32)
pCO2 arterial: 41.4 mmHg (ref 32–48)
pCO2 arterial: 41.4 mmHg (ref 32–48)
pCO2 arterial: 41 mmHg (ref 32–48)
pCO2 arterial: 42.8 mmHg (ref 32–48)
pCO2 arterial: 46.6 mmHg (ref 32–48)
pH, Arterial: 7.331 — ABNORMAL LOW (ref 7.35–7.45)
pH, Arterial: 7.305 — ABNORMAL LOW (ref 7.35–7.45)
pH, Arterial: 7.295 — ABNORMAL LOW (ref 7.35–7.45)
pH, Arterial: 7.223 — ABNORMAL LOW (ref 7.35–7.45)
pH, Arterial: 7.259 — ABNORMAL LOW (ref 7.35–7.45)
pO2, Arterial: 140 mmHg — ABNORMAL HIGH (ref 83–108)
pO2, Arterial: 122 mmHg — ABNORMAL HIGH (ref 83–108)
pO2, Arterial: 98 mmHg (ref 83–108)
pO2, Arterial: 121 mmHg — ABNORMAL HIGH (ref 83–108)
pO2, Arterial: 148 mmHg — ABNORMAL HIGH (ref 83–108)

## 2023-06-17 LAB — BASIC METABOLIC PANEL WITH GFR
Anion gap: 12 (ref 5–15)
BUN: 39 mg/dL — ABNORMAL HIGH (ref 8–23)
CO2: 18 mmol/L — ABNORMAL LOW (ref 22–32)
Calcium: 7.5 mg/dL — ABNORMAL LOW (ref 8.9–10.3)
Chloride: 103 mmol/L (ref 98–111)
Creatinine, Ser: 7.14 mg/dL — ABNORMAL HIGH (ref 0.61–1.24)
GFR, Estimated: 7 mL/min — ABNORMAL LOW (ref >60)
Glucose, Bld: 156 mg/dL — ABNORMAL HIGH (ref 70–99)
Potassium: 5.4 mmol/L — ABNORMAL HIGH (ref 3.5–5.1)
Sodium: 133 mmol/L — ABNORMAL LOW (ref 135–145)

## 2023-06-17 LAB — CBC
HCT: 29.2 % — ABNORMAL LOW (ref 39.0–52.0)
Hemoglobin: 9.6 g/dL — ABNORMAL LOW (ref 13.0–17.0)
MCH: 28.9 pg (ref 26.0–34.0)
MCHC: 32.9 g/dL (ref 30.0–36.0)
MCV: 88 fL (ref 80.0–100.0)
Platelets: 151 10*3/uL (ref 150–400)
RBC: 3.32 MIL/uL — ABNORMAL LOW (ref 4.22–5.81)
RDW: 18.7 % — ABNORMAL HIGH (ref 11.5–15.5)
WBC: 13.2 10*3/uL — ABNORMAL HIGH (ref 4.0–10.5)
nRBC: 0 % (ref 0.0–0.2)

## 2023-06-17 LAB — RENAL FUNCTION PANEL
Albumin: 2.6 g/dL — ABNORMAL LOW (ref 3.5–5.0)
Anion gap: 14 (ref 5–15)
BUN: 36 mg/dL — ABNORMAL HIGH (ref 8–23)
CO2: 18 mmol/L — ABNORMAL LOW (ref 22–32)
Calcium: 7.4 mg/dL — ABNORMAL LOW (ref 8.9–10.3)
Chloride: 102 mmol/L (ref 98–111)
Creatinine, Ser: 6.9 mg/dL — ABNORMAL HIGH (ref 0.61–1.24)
GFR, Estimated: 7 mL/min — ABNORMAL LOW (ref >60)
Glucose, Bld: 241 mg/dL — ABNORMAL HIGH (ref 70–99)
Phosphorus: 6.1 mg/dL — ABNORMAL HIGH (ref 2.5–4.6)
Potassium: 5.1 mmol/L (ref 3.5–5.1)
Sodium: 134 mmol/L — ABNORMAL LOW (ref 135–145)

## 2023-06-17 LAB — APTT: aPTT: 37 s — ABNORMAL HIGH (ref 24–36)

## 2023-06-17 LAB — POCT I-STAT, CHEM 8
BUN: 36 mg/dL — ABNORMAL HIGH (ref 8–23)
Calcium, Ion: 1.11 mmol/L — ABNORMAL LOW (ref 1.15–1.40)
Chloride: 103 mmol/L (ref 98–111)
Creatinine, Ser: 6.8 mg/dL — ABNORMAL HIGH (ref 0.61–1.24)
Glucose, Bld: 108 mg/dL — ABNORMAL HIGH (ref 70–99)
HCT: 31 % — ABNORMAL LOW (ref 39.0–52.0)
Hemoglobin: 10.5 g/dL — ABNORMAL LOW (ref 13.0–17.0)
Potassium: 5.1 mmol/L (ref 3.5–5.1)
Sodium: 133 mmol/L — ABNORMAL LOW (ref 135–145)
TCO2: 23 mmol/L (ref 22–32)

## 2023-06-17 LAB — POCT ACTIVATED CLOTTING TIME
Activated Clotting Time: 233 s
Activated Clotting Time: 239 s
Activated Clotting Time: 239 s
Activated Clotting Time: 250 s
Activated Clotting Time: 262 s
Activated Clotting Time: 245 s
Activated Clotting Time: 210 s
Activated Clotting Time: 129 s
Activated Clotting Time: 239 s

## 2023-06-17 LAB — PREPARE RBC (CROSSMATCH)

## 2023-06-17 LAB — PSA: Prostate Specific Ag, Serum: 3.3 ng/mL (ref 0.0–4.0)

## 2023-06-17 LAB — MAGNESIUM: Magnesium: 1.8 mg/dL (ref 1.7–2.4)

## 2023-06-17 LAB — ABO/RH: ABO/RH(D): A POS

## 2023-06-17 LAB — HEPATITIS B SURFACE ANTIGEN: Hepatitis B Surface Ag: NONREACTIVE

## 2023-06-17 LAB — PROTIME-INR
INR: 1.4 — ABNORMAL HIGH (ref 0.8–1.2)
Prothrombin Time: 17.1 s — ABNORMAL HIGH (ref 11.4–15.2)

## 2023-06-17 SURGERY — INSERTION, ENDOVASCULAR STENT GRAFT, AORTA, ABDOMINAL
Anesthesia: General | Site: Groin | Laterality: Right

## 2023-06-17 MED ORDER — PHENOL 1.4 % MT LIQD: 1.0000 | OROMUCOSAL | Status: DC | PRN

## 2023-06-17 MED ORDER — LIDOCAINE 2% (20 MG/ML) 5 ML SYRINGE
INTRAMUSCULAR | Status: AC
  Filled 2023-06-17: qty 5

## 2023-06-17 MED ORDER — EPHEDRINE SULFATE-NACL 50-0.9 MG/10ML-% IV SOSY
PREFILLED_SYRINGE | INTRAVENOUS | Status: DC | PRN
  Administered 2023-06-17 (×2): 5 mg via INTRAVENOUS

## 2023-06-17 MED ORDER — SODIUM CHLORIDE 0.9 % IV SOLN: 250.0000 mL | INTRAVENOUS | Status: AC | PRN

## 2023-06-17 MED ORDER — OXYCODONE HCL 5 MG/5ML PO SOLN: 5.0000 mg | Freq: Once | ORAL | Status: DC | PRN

## 2023-06-17 MED ORDER — PHENYLEPHRINE 80 MCG/ML (10ML) SYRINGE FOR IV PUSH (FOR BLOOD PRESSURE SUPPORT)
PREFILLED_SYRINGE | INTRAVENOUS | Status: DC | PRN
  Administered 2023-06-17: 160 ug via INTRAVENOUS
  Administered 2023-06-17 (×2): 80 ug via INTRAVENOUS
  Administered 2023-06-17: 160 ug via INTRAVENOUS
  Administered 2023-06-17 (×2): 80 ug via INTRAVENOUS
  Administered 2023-06-17: 160 ug via INTRAVENOUS

## 2023-06-17 MED ORDER — ASPIRIN 81 MG PO TBEC
81.0000 mg | DELAYED_RELEASE_TABLET | Freq: Every day | ORAL | Status: DC
  Administered 2023-06-19 – 2023-06-23 (×5): 81 mg via ORAL
  Filled 2023-06-17 (×5): qty 1

## 2023-06-17 MED ORDER — CEFAZOLIN SODIUM-DEXTROSE 2-4 GM/100ML-% IV SOLN
2.0000 g | Freq: Once | INTRAVENOUS | Status: AC
  Administered 2023-06-19: 2 g via INTRAVENOUS
  Filled 2023-06-17: qty 100

## 2023-06-17 MED ORDER — SODIUM CHLORIDE 0.9 % IV SOLN: 500.0000 mL | Freq: Once | INTRAVENOUS | Status: DC | PRN

## 2023-06-17 MED ORDER — CHLORHEXIDINE GLUCONATE CLOTH 2 % EX PADS
6.0000 | MEDICATED_PAD | Freq: Every day | CUTANEOUS | Status: DC
  Administered 2023-06-18 – 2023-06-23 (×5): 6 via TOPICAL

## 2023-06-17 MED ORDER — ACETAMINOPHEN 10 MG/ML IV SOLN: 1000.0000 mg | Freq: Once | INTRAVENOUS | Status: DC | PRN

## 2023-06-17 MED ORDER — ONDANSETRON HCL 4 MG/2ML IJ SOLN
INTRAMUSCULAR | Status: AC
  Filled 2023-06-17: qty 2

## 2023-06-17 MED ORDER — AMLODIPINE BESYLATE 5 MG PO TABS
5.0000 mg | ORAL_TABLET | Freq: Every day | ORAL | Status: DC
  Administered 2023-06-17: 5 mg via ORAL
  Filled 2023-06-17: qty 1

## 2023-06-17 MED ORDER — CARVEDILOL 3.125 MG PO TABS
3.1250 mg | ORAL_TABLET | Freq: Once | ORAL | Status: AC
  Administered 2023-06-17: 3.125 mg via ORAL

## 2023-06-17 MED ORDER — IODIXANOL 320 MG/ML IV SOLN
INTRAVENOUS | Status: DC | PRN
  Administered 2023-06-17: 75 mL via INTRA_ARTERIAL
  Administered 2023-06-17: 5 mL via INTRA_ARTERIAL
  Administered 2023-06-17: 82 mL via INTRA_ARTERIAL

## 2023-06-17 MED ORDER — SODIUM CHLORIDE 0.9 % IV SOLN: INTRAVENOUS | Status: DC

## 2023-06-17 MED ORDER — LACTATED RINGERS IV SOLN: INTRAVENOUS | Status: DC

## 2023-06-17 MED ORDER — FENTANYL CITRATE (PF) 100 MCG/2ML IJ SOLN: 25.0000 ug | INTRAMUSCULAR | Status: DC | PRN

## 2023-06-17 MED ORDER — GUAIFENESIN-DM 100-10 MG/5ML PO SYRP: 15.0000 mL | ORAL_SOLUTION | ORAL | Status: DC | PRN

## 2023-06-17 MED ORDER — HYDRALAZINE HCL 20 MG/ML IJ SOLN: 5.0000 mg | INTRAMUSCULAR | Status: DC | PRN

## 2023-06-17 MED ORDER — PROPOFOL 10 MG/ML IV BOLUS
INTRAVENOUS | Status: AC
  Filled 2023-06-17: qty 20

## 2023-06-17 MED ORDER — OXYCODONE HCL 5 MG PO TABS
5.0000 mg | ORAL_TABLET | ORAL | Status: DC | PRN
  Administered 2023-06-17: 5 mg via ORAL
  Administered 2023-06-18: 10 mg via ORAL
  Administered 2023-06-19: 5 mg via ORAL
  Administered 2023-06-19: 10 mg via ORAL
  Administered 2023-06-20 – 2023-06-21 (×3): 5 mg via ORAL
  Filled 2023-06-17 (×2): qty 1
  Filled 2023-06-17: qty 2
  Filled 2023-06-17 (×6): qty 1
  Filled 2023-06-17: qty 2

## 2023-06-17 MED ORDER — SODIUM CHLORIDE 0.9% FLUSH: 3.0000 mL | INTRAVENOUS | Status: DC | PRN

## 2023-06-17 MED ORDER — DEXAMETHASONE SODIUM PHOSPHATE 10 MG/ML IJ SOLN
INTRAMUSCULAR | Status: DC | PRN
  Administered 2023-06-17: 10 mg via INTRAVENOUS

## 2023-06-17 MED ORDER — SUGAMMADEX SODIUM 200 MG/2ML IV SOLN
INTRAVENOUS | Status: DC | PRN
Start: 2023-06-17 — End: 2023-06-17
  Administered 2023-06-17: 300 mg via INTRAVENOUS

## 2023-06-17 MED ORDER — HEPARIN SODIUM (PORCINE) 1000 UNIT/ML IJ SOLN
INTRAMUSCULAR | Status: AC
  Filled 2023-06-17: qty 20

## 2023-06-17 MED ORDER — HEPARIN SODIUM (PORCINE) 1000 UNIT/ML IJ SOLN
INTRAMUSCULAR | Status: AC
Start: 2023-06-17 — End: ?
  Filled 2023-06-17: qty 10

## 2023-06-17 MED ORDER — CHLORHEXIDINE GLUCONATE CLOTH 2 % EX PADS: 6.0000 | MEDICATED_PAD | Freq: Once | CUTANEOUS | Status: DC

## 2023-06-17 MED ORDER — VASOPRESSIN 20 UNIT/ML IV SOLN
INTRAVENOUS | Status: AC
  Filled 2023-06-17: qty 1

## 2023-06-17 MED ORDER — CEFAZOLIN SODIUM-DEXTROSE 2-4 GM/100ML-% IV SOLN
2.0000 g | INTRAVENOUS | Status: AC
  Administered 2023-06-17 (×2): 2 g via INTRAVENOUS
  Filled 2023-06-17: qty 100

## 2023-06-17 MED ORDER — CALCIUM ACETATE (PHOS BINDER) 667 MG PO CAPS
1334.0000 mg | ORAL_CAPSULE | Freq: Three times a day (TID) | ORAL | Status: DC
  Administered 2023-06-20 – 2023-06-23 (×5): 1334 mg via ORAL
  Filled 2023-06-17 (×5): qty 2

## 2023-06-17 MED ORDER — ACETAMINOPHEN 650 MG RE SUPP: 325.0000 mg | RECTAL | Status: DC | PRN

## 2023-06-17 MED ORDER — ACETAMINOPHEN 325 MG PO TABS: 325.0000 mg | ORAL_TABLET | ORAL | Status: DC | PRN

## 2023-06-17 MED ORDER — METOPROLOL TARTRATE 5 MG/5ML IV SOLN
2.0000 mg | INTRAVENOUS | Status: DC | PRN
  Administered 2023-06-18: 5 mg via INTRAVENOUS
  Filled 2023-06-17: qty 5

## 2023-06-17 MED ORDER — HYDROMORPHONE HCL 1 MG/ML IJ SOLN
INTRAMUSCULAR | Status: DC | PRN
  Administered 2023-06-17: .5 mg via INTRAVENOUS

## 2023-06-17 MED ORDER — HEPARIN 6000 UNIT IRRIGATION SOLUTION
Status: DC | PRN
  Administered 2023-06-17 (×2): 1

## 2023-06-17 MED ORDER — CEFAZOLIN SODIUM 1 G IJ SOLR
INTRAMUSCULAR | Status: AC
  Filled 2023-06-17: qty 20

## 2023-06-17 MED ORDER — PHENYLEPHRINE HCL-NACL 20-0.9 MG/250ML-% IV SOLN
INTRAVENOUS | Status: DC | PRN
  Administered 2023-06-17: 10 ug/min via INTRAVENOUS

## 2023-06-17 MED ORDER — HEPARIN SODIUM (PORCINE) 5000 UNIT/ML IJ SOLN
5000.0000 [IU] | Freq: Three times a day (TID) | INTRAMUSCULAR | Status: DC
  Administered 2023-06-18 – 2023-06-22 (×11): 5000 [IU] via SUBCUTANEOUS
  Filled 2023-06-17 (×12): qty 1

## 2023-06-17 MED ORDER — ROCURONIUM BROMIDE 10 MG/ML (PF) SYRINGE
PREFILLED_SYRINGE | INTRAVENOUS | Status: DC | PRN
  Administered 2023-06-17: 10 mg via INTRAVENOUS
  Administered 2023-06-17: 20 mg via INTRAVENOUS
  Administered 2023-06-17: 10 mg via INTRAVENOUS
  Administered 2023-06-17 (×2): 20 mg via INTRAVENOUS
  Administered 2023-06-17: 10 mg via INTRAVENOUS
  Administered 2023-06-17: 80 mg via INTRAVENOUS

## 2023-06-17 MED ORDER — DEXAMETHASONE SODIUM PHOSPHATE 10 MG/ML IJ SOLN
INTRAMUSCULAR | Status: AC
  Filled 2023-06-17: qty 1

## 2023-06-17 MED ORDER — BISACODYL 5 MG PO TBEC
5.0000 mg | DELAYED_RELEASE_TABLET | Freq: Every day | ORAL | Status: DC | PRN
  Administered 2023-06-22: 5 mg via ORAL
  Filled 2023-06-17: qty 1

## 2023-06-17 MED ORDER — ONDANSETRON HCL 4 MG/2ML IJ SOLN
INTRAMUSCULAR | Status: DC | PRN
  Administered 2023-06-17: 4 mg via INTRAVENOUS

## 2023-06-17 MED ORDER — FENTANYL CITRATE (PF) 250 MCG/5ML IJ SOLN
INTRAMUSCULAR | Status: DC | PRN
  Administered 2023-06-17: 150 ug via INTRAVENOUS
  Administered 2023-06-17 (×2): 50 ug via INTRAVENOUS

## 2023-06-17 MED ORDER — ATORVASTATIN CALCIUM 80 MG PO TABS
80.0000 mg | ORAL_TABLET | Freq: Every morning | ORAL | Status: DC
  Administered 2023-06-19 – 2023-06-23 (×5): 80 mg via ORAL
  Filled 2023-06-17 (×5): qty 1

## 2023-06-17 MED ORDER — FENTANYL CITRATE (PF) 250 MCG/5ML IJ SOLN
INTRAMUSCULAR | Status: AC
  Filled 2023-06-17: qty 5

## 2023-06-17 MED ORDER — TAMSULOSIN HCL 0.4 MG PO CAPS
0.4000 mg | ORAL_CAPSULE | Freq: Every day | ORAL | Status: DC
  Administered 2023-06-17 – 2023-06-20 (×3): 0.4 mg via ORAL
  Filled 2023-06-17 (×4): qty 1

## 2023-06-17 MED ORDER — MIRTAZAPINE 15 MG PO TABS
15.0000 mg | ORAL_TABLET | Freq: Every day | ORAL | Status: DC
  Administered 2023-06-17 – 2023-06-22 (×6): 15 mg via ORAL
  Filled 2023-06-17 (×7): qty 1

## 2023-06-17 MED ORDER — ROCURONIUM BROMIDE 10 MG/ML (PF) SYRINGE
PREFILLED_SYRINGE | INTRAVENOUS | Status: AC
  Filled 2023-06-17: qty 10

## 2023-06-17 MED ORDER — HEPARIN SODIUM (PORCINE) 1000 UNIT/ML IJ SOLN
INTRAMUSCULAR | Status: DC | PRN
  Administered 2023-06-17: 10000 [IU] via INTRAVENOUS
  Administered 2023-06-17: 3000 [IU] via INTRAVENOUS
  Administered 2023-06-17 (×4): 2000 [IU] via INTRAVENOUS

## 2023-06-17 MED ORDER — INSULIN ASPART 100 UNIT/ML IJ SOLN
INTRAMUSCULAR | Status: DC | PRN
  Administered 2023-06-17: 10 [IU] via INTRAVENOUS

## 2023-06-17 MED ORDER — LABETALOL HCL 5 MG/ML IV SOLN: 10.0000 mg | INTRAVENOUS | Status: DC | PRN

## 2023-06-17 MED ORDER — HEMOSTATIC AGENTS (NO CHARGE) OPTIME
TOPICAL | Status: DC | PRN
  Administered 2023-06-17: 1 via TOPICAL

## 2023-06-17 MED ORDER — ALBUMIN HUMAN 5 % IV SOLN: INTRAVENOUS | Status: DC | PRN

## 2023-06-17 MED ORDER — HEPARIN 6000 UNIT IRRIGATION SOLUTION
Status: AC
  Filled 2023-06-17: qty 500

## 2023-06-17 MED ORDER — DEXTROSE 50 % IV SOLN
INTRAVENOUS | Status: AC
  Filled 2023-06-17: qty 50

## 2023-06-17 MED ORDER — 0.9 % SODIUM CHLORIDE (POUR BTL) OPTIME
TOPICAL | Status: DC | PRN
  Administered 2023-06-17: 2000 mL

## 2023-06-17 MED ORDER — LACTATED RINGERS IV SOLN: INTRAVENOUS | Status: DC | PRN

## 2023-06-17 MED ORDER — POTASSIUM CHLORIDE CRYS ER 20 MEQ PO TBCR
20.0000 meq | EXTENDED_RELEASE_TABLET | Freq: Every day | ORAL | Status: DC | PRN
Start: 2023-06-17 — End: 2023-06-17

## 2023-06-17 MED ORDER — ORAL CARE MOUTH RINSE: 15.0000 mL | Freq: Once | OROMUCOSAL | Status: AC

## 2023-06-17 MED ORDER — ONDANSETRON HCL 4 MG/2ML IJ SOLN: 4.0000 mg | Freq: Once | INTRAMUSCULAR | Status: DC | PRN

## 2023-06-17 MED ORDER — PROTAMINE SULFATE 10 MG/ML IV SOLN
INTRAVENOUS | Status: DC | PRN
  Administered 2023-06-17: 50 mg via INTRAVENOUS

## 2023-06-17 MED ORDER — DOCUSATE SODIUM 100 MG PO CAPS
100.0000 mg | ORAL_CAPSULE | Freq: Every day | ORAL | Status: DC
  Administered 2023-06-19 – 2023-06-23 (×5): 100 mg via ORAL
  Filled 2023-06-17 (×5): qty 1

## 2023-06-17 MED ORDER — PHENYLEPHRINE 80 MCG/ML (10ML) SYRINGE FOR IV PUSH (FOR BLOOD PRESSURE SUPPORT)
PREFILLED_SYRINGE | INTRAVENOUS | Status: AC
  Filled 2023-06-17: qty 10

## 2023-06-17 MED ORDER — PROTAMINE SULFATE 10 MG/ML IV SOLN
INTRAVENOUS | Status: AC
  Filled 2023-06-17: qty 5

## 2023-06-17 MED ORDER — CHLORHEXIDINE GLUCONATE CLOTH 2 % EX PADS
6.0000 | MEDICATED_PAD | Freq: Once | CUTANEOUS | Status: DC
Start: 2023-06-17 — End: 2023-06-17

## 2023-06-17 MED ORDER — LIDOCAINE 2% (20 MG/ML) 5 ML SYRINGE
INTRAMUSCULAR | Status: DC | PRN
  Administered 2023-06-17: 100 mg via INTRAVENOUS

## 2023-06-17 MED ORDER — MAGNESIUM SULFATE 2 GM/50ML IV SOLN: 2.0000 g | Freq: Every day | INTRAVENOUS | Status: DC | PRN

## 2023-06-17 MED ORDER — INSULIN ASPART 100 UNIT/ML IJ SOLN: INTRAMUSCULAR | Status: DC | PRN

## 2023-06-17 MED ORDER — SENNOSIDES-DOCUSATE SODIUM 8.6-50 MG PO TABS: 1.0000 | ORAL_TABLET | Freq: Every evening | ORAL | Status: DC | PRN

## 2023-06-17 MED ORDER — SODIUM CHLORIDE 0.9% FLUSH
3.0000 mL | Freq: Two times a day (BID) | INTRAVENOUS | Status: DC
  Administered 2023-06-18 – 2023-06-23 (×10): 3 mL via INTRAVENOUS

## 2023-06-17 MED ORDER — CARVEDILOL 3.125 MG PO TABS
ORAL_TABLET | ORAL | Status: AC
  Filled 2023-06-17: qty 1

## 2023-06-17 MED ORDER — PANTOPRAZOLE SODIUM 40 MG PO TBEC
40.0000 mg | DELAYED_RELEASE_TABLET | Freq: Every morning | ORAL | Status: DC
  Administered 2023-06-19 – 2023-06-23 (×5): 40 mg via ORAL
  Filled 2023-06-17 (×5): qty 1

## 2023-06-17 MED ORDER — HEPARIN 6000 UNIT IRRIGATION SOLUTION
Status: AC
Start: 2023-06-17 — End: ?
  Filled 2023-06-17: qty 500

## 2023-06-17 MED ORDER — ALUM & MAG HYDROXIDE-SIMETH 200-200-20 MG/5ML PO SUSP: 15.0000 mL | ORAL | Status: DC | PRN

## 2023-06-17 MED ORDER — CARVEDILOL 25 MG PO TABS
25.0000 mg | ORAL_TABLET | Freq: Two times a day (BID) | ORAL | Status: DC
  Administered 2023-06-17: 25 mg via ORAL
  Filled 2023-06-17: qty 1

## 2023-06-17 MED ORDER — ZOLPIDEM TARTRATE 5 MG PO TABS
5.0000 mg | ORAL_TABLET | Freq: Every evening | ORAL | Status: DC | PRN
  Administered 2023-06-17 – 2023-06-18 (×2): 5 mg via ORAL
  Filled 2023-06-17 (×2): qty 1

## 2023-06-17 MED ORDER — PROPOFOL 10 MG/ML IV BOLUS
INTRAVENOUS | Status: DC | PRN
  Administered 2023-06-17: 90 mg via INTRAVENOUS

## 2023-06-17 MED ORDER — HYDROMORPHONE HCL 1 MG/ML IJ SOLN
0.5000 mg | INTRAMUSCULAR | Status: DC | PRN
  Administered 2023-06-17 – 2023-06-19 (×8): 1 mg via INTRAVENOUS
  Filled 2023-06-17 (×8): qty 1

## 2023-06-17 MED ORDER — HYDROMORPHONE HCL 1 MG/ML IJ SOLN
INTRAMUSCULAR | Status: AC
  Filled 2023-06-17: qty 0.5

## 2023-06-17 MED ORDER — SODIUM ZIRCONIUM CYCLOSILICATE 10 G PO PACK
10.0000 g | PACK | Freq: Once | ORAL | Status: AC
  Administered 2023-06-17: 10 g via ORAL
  Filled 2023-06-17: qty 1

## 2023-06-17 MED ORDER — DEXTROSE 50 % IV SOLN
INTRAVENOUS | Status: DC | PRN
  Administered 2023-06-17: 50 mL via INTRAVENOUS

## 2023-06-17 MED ORDER — ONDANSETRON HCL 4 MG/2ML IJ SOLN
4.0000 mg | Freq: Four times a day (QID) | INTRAMUSCULAR | Status: DC | PRN
  Administered 2023-06-18: 4 mg via INTRAVENOUS
  Filled 2023-06-17: qty 2

## 2023-06-17 MED ORDER — CHLORHEXIDINE GLUCONATE 0.12 % MT SOLN
15.0000 mL | Freq: Once | OROMUCOSAL | Status: AC
  Administered 2023-06-17: 15 mL via OROMUCOSAL
  Filled 2023-06-17: qty 15

## 2023-06-17 MED ORDER — OXYCODONE HCL 5 MG PO TABS: 5.0000 mg | ORAL_TABLET | Freq: Once | ORAL | Status: DC | PRN

## 2023-06-17 SURGICAL SUPPLY — 66 items
BAG COUNTER SPONGE SURGICOUNT (BAG) ×3 IMPLANT
BALLOON MUSTANG 10X80X75 (BALLOONS) IMPLANT
BALLOON MUSTANG 9X40X75 (BALLOONS) IMPLANT
CANISTER SUCT 3000ML PPV (MISCELLANEOUS) ×3 IMPLANT
CATH BEACON 5.038 65CM KMP-01 (CATHETERS) ×3 IMPLANT
CATH OMNI FLUSH .035X70CM (CATHETERS) ×3 IMPLANT
CLIP TI MEDIUM 24 (CLIP) IMPLANT
CLIP TI WIDE RED SMALL 24 (CLIP) IMPLANT
COIL NESTER 14X8 (Embolic) IMPLANT
DERMABOND ADVANCED .7 DNX12 (GAUZE/BANDAGES/DRESSINGS) ×3 IMPLANT
DEVICE CLOSURE PERCLS PRGLD 6F (VASCULAR PRODUCTS) ×12 IMPLANT
DEVICE TORQUE SEADRAGON GRN (MISCELLANEOUS) IMPLANT
DRAPE HALF SHEET 40X57 (DRAPES) IMPLANT
DRSG TEGADERM 2-3/8X2-3/4 SM (GAUZE/BANDAGES/DRESSINGS) ×6 IMPLANT
ELECTRODE REM PT RTRN 9FT ADLT (ELECTROSURGICAL) ×6 IMPLANT
GAUZE 4X4 16PLY ~~LOC~~+RFID DBL (SPONGE) IMPLANT
GAUZE SPONGE 2X2 8PLY STRL LF (GAUZE/BANDAGES/DRESSINGS) ×3 IMPLANT
GLOVE BIO SURGEON STRL SZ7 (GLOVE) IMPLANT
GLOVE BIO SURGEON STRL SZ7.5 (GLOVE) ×3 IMPLANT
GLOVE BIOGEL PI IND STRL 8 (GLOVE) ×3 IMPLANT
GOWN STRL REUS W/ TWL LRG LVL3 (GOWN DISPOSABLE) ×9 IMPLANT
GOWN STRL REUS W/ TWL XL LVL3 (GOWN DISPOSABLE) ×3 IMPLANT
GRAFT BALLN CATH 65CM (BALLOONS) ×3 IMPLANT
GUIDEWIRE ANGLED .035X260CM (WIRE) IMPLANT
HEMOSTAT SNOW SURGICEL 2X4 (HEMOSTASIS) IMPLANT
KIT BASIN OR (CUSTOM PROCEDURE TRAY) ×3 IMPLANT
KIT ENCORE 26 ADVANTAGE (KITS) IMPLANT
KIT TURNOVER KIT B (KITS) ×3 IMPLANT
LOOP VASCLR MAXI BLUE 18IN ST (MISCELLANEOUS) IMPLANT
LOOP VESSEL MINI RED (MISCELLANEOUS) IMPLANT
LOOPS VASCLR MAXI BLUE 18IN ST (MISCELLANEOUS) ×6 IMPLANT
NS IRRIG 1000ML POUR BTL (IV SOLUTION) ×3 IMPLANT
PACK ENDOVASCULAR (PACKS) ×3 IMPLANT
PAD ARMBOARD POSITIONER FOAM (MISCELLANEOUS) ×6 IMPLANT
PATCH VASC XENOSURE 1X6 (Vascular Products) IMPLANT
SET MICROPUNCTURE 5F STIFF (MISCELLANEOUS) ×3 IMPLANT
SHEATH DRYSEAL FLEX 20FR 33CM (SHEATH) IMPLANT
SHEATH DRYSEAL FLEX 24FR 33CM (SHEATH) IMPLANT
SHEATH PEEL AWAY 15.5X22F (SHEATH) IMPLANT
SHEATH PINNACLE 5F 10CM (SHEATH) IMPLANT
SHEATH PINNACLE 8F 10CM (SHEATH) IMPLANT
STENT EPIC VASCULAR 10X60X75 (Permanent Stent) IMPLANT
STENT GRFT THORAC ACS 45X45X10 (Endovascular Graft) IMPLANT
STENT VIABAHN 10X5X120 (Permanent Stent) IMPLANT
STOPCOCK MORSE 400PSI 3WAY (MISCELLANEOUS) ×3 IMPLANT
SUT ETHIBOND 5 0 C 1 30 (SUTURE) IMPLANT
SUT MNCRL AB 4-0 PS2 18 (SUTURE) ×3 IMPLANT
SUT PROLENE 5 0 C 1 24 (SUTURE) IMPLANT
SUT PROLENE 6 0 BV (SUTURE) IMPLANT
SUT PROLENE 6 0 CC (SUTURE) IMPLANT
SUT SILK 2-0 18XBRD TIE 12 (SUTURE) IMPLANT
SUT SILK 3-0 18XBRD TIE 12 (SUTURE) IMPLANT
SUT SILK 4-0 18XBRD TIE 12 (SUTURE) IMPLANT
SUT VIC AB 2-0 CT1 TAPERPNT 27 (SUTURE) IMPLANT
SUT VIC AB 3-0 SH 27X BRD (SUTURE) IMPLANT
SYR 20ML LL LF (SYRINGE) ×3 IMPLANT
SYR BULB IRRIG 60ML STRL (SYRINGE) IMPLANT
TAPE UMBILICAL 1/8 X36 TWILL (MISCELLANEOUS) IMPLANT
TOWEL GREEN STERILE (TOWEL DISPOSABLE) ×3 IMPLANT
TRAY FOLEY MTR SLVR 16FR STAT (SET/KITS/TRAYS/PACK) ×3 IMPLANT
TUBING CIL FLEX 10 FLL-RA (TUBING) IMPLANT
TUBING EXTENTION W/L.L. (IV SETS) IMPLANT
TUBING HIGH PRESSURE 120CM (CONNECTOR) ×3 IMPLANT
WIRE AMPLATZ SS-J .035X180CM (WIRE) ×6 IMPLANT
WIRE BENTSON .035X145CM (WIRE) ×6 IMPLANT
WIRE STIFF LUNDERQUIST 260MM (WIRE) IMPLANT

## 2023-06-17 NOTE — Progress Notes (Signed)
     Flat bedrest for 4 hours post op Bed rest with head of bed 30 degrees or less for 24 hours.    Nephrology called for HD tonight post op K+ 6.7  B LE PT/DP signals Right groin per close failure with open enterectomy and femoral/SFA exposure and patch angioplasty   Post op stable  Rocky Cipro PA-C

## 2023-06-17 NOTE — Anesthesia Procedure Notes (Signed)
 Procedure Name: Intubation Date/Time: 06/17/2023 8:41 AM  Performed by: Jamas Maywood, CRNAPre-anesthesia Checklist: Patient identified, Emergency Drugs available, Suction available and Patient being monitored Patient Re-evaluated:Patient Re-evaluated prior to induction Oxygen Delivery Method: Circle system utilized Preoxygenation: Pre-oxygenation with 100% oxygen Induction Type: IV induction Ventilation: Mask ventilation without difficulty and Oral airway inserted - appropriate to patient size Laryngoscope Size: Mac and 4 Grade View: Grade II Tube type: Oral Tube size: 7.5 mm Number of attempts: 1 Airway Equipment and Method: Stylet and Oral airway Placement Confirmation: ETT inserted through vocal cords under direct vision, positive ETCO2 and breath sounds checked- equal and bilateral Secured at: 23 cm Tube secured with: Tape Dental Injury: Teeth and Oropharynx as per pre-operative assessment

## 2023-06-17 NOTE — Transfer of Care (Signed)
 Immediate Anesthesia Transfer of Care Note  Patient: Ronald [name]  Procedure(s) Performed: INSERTION, ENDOVASCULAR STENT GRAFT, AORTA, ABDOMINAL ANGIOPLASTY, USING PATCH GRAFT USING XENOSURE 1X6CM (Right: Groin) REPAIR, ARTERY, COMMON FEMORAL (Right: Groin) ENDARTERECTOMY, COMMON FEMORAL AND SUPERFICIAL FEMORAL (Right: Groin) ULTRASOUND GUIDANCE, FOR VASCULAR ACCESS (Bilateral: Groin) INSERTION, STENT, ARTERY, ILIAC (Right: Groin) ANGIOGRAM, LOWER EXTREMITY (Right: Groin)  Patient Location: PACU  Anesthesia Type:General  Level of Consciousness: awake, alert , and oriented  Airway & Oxygen Therapy: Patient Spontanous Breathing  Post-op Assessment: Report given to RN and Post -op Vital signs reviewed and stable  Post vital signs: Reviewed and stable  Last Vitals:  Vitals Value Taken Time  BP 138/65 06/17/23 1445  Temp    Pulse 81 06/17/23 1449  Resp 18 06/17/23 1449  SpO2 95 % 06/17/23 1449  Vitals shown include unfiled device data.  Last Pain:  Vitals:   06/17/23 0708  PainSc: 0-No pain         Complications: There were no known notable events for this encounter.

## 2023-06-17 NOTE — Anesthesia Procedure Notes (Signed)
 Arterial Line Insertion Start/End5/08/2023 7:45 AM, 06/17/2023 7:50 AM Performed by: Leslye Rast, MD, Jamas Maywood, CRNA, CRNA  Patient location: Pre-op. Preanesthetic checklist: patient identified, IV checked, site marked, risks and benefits discussed, surgical consent, monitors and equipment checked, pre-op evaluation, timeout performed and anesthesia consent Lidocaine  1% used for infiltration Right, radial was placed Catheter size: 20 G Hand hygiene performed , maximum sterile barriers used  and Seldinger technique used Allen's test indicative of satisfactory collateral circulation Attempts: 1 Procedure performed without using ultrasound guided technique. Following insertion, dressing applied. Post procedure assessment: normal and unchanged  Patient tolerated the procedure well with no immediate complications.

## 2023-06-17 NOTE — Op Note (Signed)
 Date: Jun 17, 2023  Preoperative diagnosis: 13 cm abdominal aortic aneurysm  Postoperative diagnosis same  Procedure: 1.  Ultrasound-guided access right common femoral artery with delivery of Perclose closure device 2.  Ultrasound-guided access of left common femoral artery 3.  Abdominal aortogram with catheter selection of abdominal aorta and catheter selection SMA 4.  Abdominal aortic stent graft using a physician modified endoprosthesis (Gore 45 mm x 10 cm thoracic endograft with back table modification creating scallop for SMA) 5.  Angioplasty and stent of right external iliac artery (10 mm x 60 mm epic stent post-dilated with a 9 mm Mustang) 6.  Cutdown on the right common femoral artery for open common femoral exposure 7.  Right common femoral endarterectomy including endarterectomy of the proximal SFA and profunda 8.  Right lower extremity angiogram 9.  Endarterectomy of the proximal SFA with bovine patch angioplasty  Surgeon: Dr. Young Hensen, MD  Co-surgeon: Dr. Delaney Fearing, MD  Assistant: Rocky Cipro, [place]  Indications: Patient is an 83 year old male that previously underwent AAA repair in [place]  for 6.4 cm AAA in 2013 with aortobiiliac stent graft.  He now has evidence of a type Ia endoleak with no proximal seal.  His abdominal aortic aneurysm now measures 13 cm.  He presents for stent graft repair using physician modified endoprosthesis with back table after risks benefits discussed.   Co-surgeon was needed given the complexity of the case including back table physician modification of the endograft as well as difficult iliac access and right common femoral endarterectomy with cutdown at completion.  Findings: Initially partially deployed the endograft on the back table and created an SMA scallop on the Gore thoracic device and this was re-sheathed.  Ultrasound-guided access of right common femoral artery with Perclose closure for the main body 24 French  sheath.  Also had ultrasound-guided access of the left common femoral artery for delivery of 5 French sheath.  We had trouble getting our sheath to advance in the right groin due to significantly diseased iliacs.  I predilated with a 20 French dilator and then ended up having to angioplasty the right external iliac artery and distal common iliac artery with 9 mm and 10 mm Mustang.  Finally I was able to get a 24 French sheath into the previous aortobiiliac stent graft.  We then delivered our 45 mm x 10 cm thoracic endograft through the 24 French sheath in the right groin.  This was modified with a scallop for the SMA with Nester coils.  After abdominal angiogram to identify the SMA we deployed this at graft at 50% and then rotated the device.  It was difficult to see the takeoff of the SMA so we cannulated the SMA and ultimately we were able to rotate the device and deployed this at the SMA as indicated.  At completion we had to perform balloon angioplasty with a Coda balloon.  The stent was deployed into the previous existing aortobiiliac stent graft.  We covered the renal arteries given ESRD and had filling of the SMA and celiac at completion.  No evidence of type Ia endoleak and no filling of the aneurysm at completion.  We then pulled our sheath back on the right and did a retrograde sheath shot.  There was a high-grade calcified stenosis in the right external iliac artery at the hypogastric.  We stented this with a 10 mm x 60 mm epic stent and postdilated with a 9 mm Mustang.  Ultimately we then removed the  sheath from the right groin and tied down the Perclose's.  Patient became hypotensive.  We were able to put a microcatheter up and retrograde sheath shot showed extravasation from the common femoral artery with failure of the Perclose.  This required open exposure of the right common femoral artery and performed cutdown on the common femoral performed extensive endarterectomy of the common femoral as well as  the proximal SFA and profunda with bovine patch angioplasty.  I also repaired the distal external iliac artery on the posterior wall.  At completion we had no signals in the right foot.  I then shot a right lower extremity angiogram with a micro sheath.  There was a high-grade calcified stenosis in the proximal SFA as well as the profunda.  I then went back and open the SFA and performed a second endarterectomy more distal and I was able to find a nice endpoint that was tacked down.  I performed a separate bovine patch on the SFA.  Ultimately patient now had brisk signals in the foot.  EBL: 1500 mL  Resuscitation: 2 units PRBCs  Anesthesia: General  Details: Patient was taken to the operating room after informed consent was obtained.  Placed on operative table in supine position.  Initially we started on the back table with the Gore 45 mm x 10 cm thoracic endograft.  This was partially deployed on the back table down to the second stent ring.  Dr. Susi Eric then used cautery and ultimately burned a scallop for the SMA on the endograft.  This measured approximately 15 mm deep by 15 mm wide.  We then reinforced the scallop with Nester coils on each side of the scalp and inferiorly so that this could be visualized under fluoroscopy using Nester coils and Ethibond 5-0 suture.  The graft was then reconstrained.    We then rescrubbed and a timeout was performed.  Evaluated the right common femoral artery with ultrasound, it was patent, an image was saved.  This was heavily diseased.  We tried to find a soft spot in the artery for access and this was accessed with a micro access needle and placed a microwire made a stab incision with 11 blade scalpel and dissected down with a hemostat.  I then put a micro sheath and used a Bentson wire and then dilated with an 8 [place] dilator.  We then deployed Perclose devices at 10:00 and 2:00 in the right common femoral artery.  We then placed a short 8 [place] sheath.  Dr.  Rosea Conch then used ultrasound-guided access to the left common femoral artery but did not place any Perclose's as placed a short 5 [place] sheath. The patient was then given 100 units/kg IV heparin .  ACT was checked to maintain greater than 250.  This was checked throughout the case and additional heparin  was given.  We went ahead and exchanged for stiff Amplatz wire on the right and I tried to insert the 24 [place] Gore dry seal.  The sheath would not advance and had significant resistance.  He did have very calcified iliacs.  I then used a 20 [place] Gore dilator and I was able to get this through the right iliac all the way into the existing endograft.  I then tried the 24 [place] sheath again and this would not advance.  We then exchanged for a Lundy wire.  Ultimately made the decision to angioplasty the external iliac and proximal common iliac on the right where there was heavy disease noted at  the takeoff of the hypogastric.  We used 9 mm and 10 mm Mustang balloons inflated to nominal pressure for 2 minutes in this area just distal to the right limb of his existing endograft.  Finally I was able to get a 24 [place] Gore dry seal to pass after I passed a 24 [place] dilator first.  We then came with pigtail on the left and I delivered the main body thoracic endograft that had been modified up the right.  We then got abdominal aortogram with catheter in the aorta at the level of the SMA.  The SMA was identified.  We planned to cover the renals as he is now end-stage renal disease.  We went ahead and unsheathed the endograft in his visceral aorta above his existing endograft as we pulled the sheath back.  We then went ahead and rotated the device to where we thought it aligned with the SMA anteriorly in order for the scallop to work.  We then deployed 50% of the endograft here.  Ultimately we used fluoroscopy both AP and RAO imaging to align the endograft.  It was difficult to see the takeoff of the SMA so ultimately we  cannulated this with a Glidewire and Omni Flush catheter to ensure we could see the takeoff of the SMA and finally we rotated the graft to where we were satisfied that the SMA was filling and was widely patent.  We briefly did pull the graft down to ensure this could happen.  We then finished deploying the main body of the graft with the aortic stent graft into the main body of his old aortic graft.  I then went up with Coda balloon and we angioplastied the overlapping component of the thoracic endograft into the previous abdominal aortic endograft.  Final aortogram showed filling of the celiac and SMA with no evidence of a type Ia endoleak and appropriate seal and no filling of his AAA. We then went ahead and pulled the 24 French sheath in the right groin back into the distal external iliac and got a retrograde sheath shot.  There was heavily diseased high-grade stenotic lesion in the right external iliac at the takeoff of the hypogastric.  We then stented this with a 10 mm x 60 mm epic stent postdilated with a 9 mm balloon.  We then pulled out the 24 French sheath and held manual pressure in the right groin.  Patient immediately became hypotensive.  I was able to get a microsheath and take a picture and there was clear extravasation from the common femoral artery where we had inserted the 24 French sheath.  We immediately made a transverse incision and cutdown on the common femoral artery this was heavily diseased and scarred and given previous percutaneous access in the past.  Ultimately I was able to get a vessel loop on the external iliac for proximal control and we also inflated our 9 mm balloon in the external iliac stent for additional inflow control.  We dissected down to the SFA profunda bifurcation and got Vesseloops around this.  I then opened the right common femoral artery with Potts scissors after we removed our wire and the artery was heavily diseased and there was a large posterior plaque.  We then  endarterectomized all the plaque in the common femoral with eversion endarterectomy of the profunda and SMA as there was really no soft endpoint.  A bovine pericardial patch was brought on the field and this was sewn in place to the  common femoral artery with 5-0 Prolene parachute technique and de-aired prior to completion with the help of Dr. Susi Eric.  Once we came off clamps we did not have a signal in the right foot.  I elected to shoot an angiogram.  I used a microcatheter after we got micro access of our sheath in the right groin.  Right lower extremity angiogram was then obtained.  There was a high-grade calcified plaque in the proximal SFA that was limiting flow.  He could also see disease in the profunda.  I then elected to recontrol the SFA with a Henley clamp beyond this plaque where the artery was soft and I controlled the inflow of the common femoral with a Henley clamp as well.  We then opened the SFA with a separate arteriotomy and removed a large calcified plaque that was flow-limiting.  We then flushed this artery and had another plaque that flushed out likely from the profunda.  We then took a bovine pericardial patch and sewed this on the proximal SFA with 5-0 Prolene parachute technique and this was de-aired prior to completion.  We now had brisk signals in the SFA and profunda in the groin. We then had brisk signals in both feet.  Protamine was given for reversal.  The right groin was irrigated out and closed in multiple layers of 2-0 Vicryl, 3-0 Vicryl, 4-0 Monocryl Dermabond.  The left 5 French sheath was removed and manual pressure was held.  He was awakened from anesthesia and taken to recovery in stable condition.  Complication: None  Condition: Stable  Young Hensen, MD [facility] Office: [phone]   Young Hensen

## 2023-06-17 NOTE — Progress Notes (Signed)
 Follow up on BMP checked while patient in PACU and K now 5.0. There are no indications for dialysis at this time. I expect his K will rise again post op and will give 1 dose lokelma now, recheck AM labs and decide if dialysis needed. His outpatient dialysis prescription is Monday and Friday only but we will supplement with extra treatments as clinically needed.  Adrian Alba MD

## 2023-06-17 NOTE — Progress Notes (Signed)
 Pt arrived to 4e25 via bed from PACU. Received report from Echelon, Charity fundraiser. See assessment.

## 2023-06-17 NOTE — Consult Note (Signed)
 [facility]  INPATIENT CONSULTATION  Reason for Consultation: ESRD Requesting Provider: Dr. Fulton Job  HPI: Ronald [name] is an 83 y.o. male with ESRD on HD, CAD s/p CABG. HTN, HFrEF, GERD, PAD currently admitted after planned EVAR today by Dr. Fulton Job and nephrology is consulted for comanagement of ESRD and assoc conditions.   Planned EVAR today of large AAA. EBL ~1547mL, 2u pRBC given in OR.  K 6.7 intra-op.  Patient seen in post op area, awake and somewhat confused still post op.  Denies complaint to me, per RN c/o some dyspnea.   PMH: Past Medical History:  Diagnosis Date   CHF (congestive heart failure) (HCC)    GERD (gastroesophageal reflux disease)    Glaucoma    Hypertension    Macular degeneration    Myocardial infarction [facility])    1999   Prostate cancer (HCC) 02/2018   Renal insufficiency    Stroke (HCC)    no deficits; had stroke while trying to place "stents in legs".   PSH: Past Surgical History:  Procedure Laterality Date   ABDOMINAL AORTIC ANEURYSM REPAIR  2012   IN [place]    APPENDECTOMY     AV FISTULA PLACEMENT Left 10/16/2020   Procedure: LEFT ARM ARTERIOVENOUS (AV) FISTULA CREATION;  Surgeon: Mayo Speck, MD;  Location: [facility];  Service: Vascular;  Laterality: Left;   CORONARY ARTERY BYPASS GRAFT  01/1998   3 vessels 18 years ago   INGUINAL HERNIA REPAIR Right 09/20/2015   Procedure: REPAIR RIGHT INGUINAL HERNIA  WITH MESH;  Surgeon: Franki Isles, MD;  Location: [facility];  Service: General;  Laterality: Right;   INSERTION OF DIALYSIS CATHETER Right 08/02/2020   Procedure: INSERTION OF RIGHT TUNNELED INTERNAL JUGULAR  DIALYSIS CATHETER;  Surgeon: Mayo Speck, MD;  Location: [facility];  Service: Vascular;  Laterality: Right;   REMOVAL OF A DIALYSIS CATHETER N/A 03/19/2021   Procedure: MINOR REMOVAL OF A TUNNELED DIALYSIS CATHETER;  Surgeon: Mayo Speck, MD;  Location: [facility];  Service: Vascular;  Laterality: N/A;   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    Medications:  I have reviewed the patient's current medications.  Medications Prior to Admission  Medication Sig Dispense Refill   amLODipine (NORVASC) 5 MG tablet Take 5 mg by mouth at bedtime.     aspirin  EC 81 MG tablet Take 1 tablet (81 mg total) by mouth daily. Swallow whole.     atorvastatin  (LIPITOR) 80 MG tablet Take 80 mg by mouth in the morning.     calcium  acetate (PHOSLO ) 667 MG capsule Take 1,334 mg by mouth 3 (three) times daily. citracal     carvedilol  (COREG ) 25 MG tablet Take 1 tablet (25 mg total) by mouth 2 (two) times daily with a meal. 60 tablet 1   mirtazapine (REMERON) 15 MG tablet Take 15 mg by mouth at bedtime.     pantoprazole (PROTONIX) 40 MG tablet Take 40 mg by mouth in the morning.     tamsulosin  (FLOMAX ) 0.4 MG CAPS capsule Take 1  capsule (0.4 mg total) by mouth daily after supper. 90 capsule 3   traMADol  (ULTRAM ) 50 MG tablet Take 50 mg by mouth every 8 (eight) hours as needed (pain.).     zolpidem  (AMBIEN ) 10 MG tablet Take 10 mg by mouth at bedtime as needed for sleep.      ALLERGIES:  No Known Allergies  FAM HX: Family History  Problem Relation Age of Onset   Cancer Father     Social History:   reports that he has quit smoking. His smoking use included cigarettes. He started smoking about 65 years ago. He has a 32.9 pack-year smoking history. He has never used smokeless tobacco. He reports that he does not drink alcohol and does not use drugs.  ROS: unable to obtain full ROS due to patient post op sedation  Blood pressure (!) 170/77, pulse (!) 51, temperature 97.7 F (36.5 C), resp.  rate 18, height 6\' 2"  (1.88 m), weight 94.9 kg, SpO2 99%. PHYSICAL EXAM: Gen: lying flat in no distress  Eyes: EOMI ENT: MMM Neck: supple CV: sinus brady on monitor Lungs: lying flat, sat 99% on 2L [place], sounds clear to bases Extr: no edema, pedal pulses present on doppler Neuro: Awake, mumbling "I can see you", appears grossly nonfocal   Results for orders placed or performed during the hospital encounter of 06/17/23 (from the past 48 hours)  ABO/Rh     Status: None   Collection Time: 06/17/23  7:30 AM  Result Value Ref Range   ABO/RH(D)      A POS Performed at [facility], [address], [place], [place] [address]   POCT Activated clotting time     Status: None   Collection Time: 06/17/23 10:10 AM  Result Value Ref Range   Activated Clotting Time 233 seconds    Comment: Reference range 74-137 seconds for patients not on anticoagulant therapy.  I-STAT 7, (LYTES, BLD GAS, ICA, H+H)     Status: Abnormal   Collection Time: 06/17/23 10:15 AM  Result Value Ref Range   pH, Arterial 7.331 (L) 7.35 - 7.45   pCO2 arterial 41.4 32 - 48 mmHg   pO2, Arterial 140 (H) 83 - 108 mmHg   Bicarbonate 22.4 20.0 - 28.0 mmol/L   TCO2 24 22 - 32 mmol/L   O2 Saturation 99 %   Acid-base deficit 4.0 (H) 0.0 - 2.0 mmol/L   Sodium 134 (L) 135 - 145 mmol/L   Potassium 4.7 3.5 - 5.1 mmol/L   Calcium , Ion 1.11 (L) 1.15 - 1.40 mmol/L   HCT 28.0 (L) 39.0 - 52.0 %   Hemoglobin 9.5 (L) 13.0 - 17.0 g/dL   Patient temperature 60.4 C    Sample type ARTERIAL   POCT Activated clotting time     Status: None   Collection Time: 06/17/23 10:24 AM  Result Value Ref Range   Activated Clotting Time 239 seconds    Comment: Reference range 74-137 seconds for patients not on anticoagulant therapy.  POCT Activated clotting time     Status: None   Collection Time: 06/17/23 10:36 AM  Result Value Ref Range   Activated Clotting Time 250 seconds    Comment: Reference range 74-137 seconds for patients not on  anticoagulant therapy.  POCT Activated clotting time     Status: None   Collection Time: 06/17/23 11:12 AM  Result Value Ref Range   Activated Clotting Time 239 seconds    Comment: Reference range 74-137 seconds for patients not  on anticoagulant therapy.  POCT Activated clotting time     Status: None   Collection Time: 06/17/23 11:26 AM  Result Value Ref Range   Activated Clotting Time 262 seconds    Comment: Reference range 74-137 seconds for patients not on anticoagulant therapy.  I-STAT, chem 8     Status: Abnormal   Collection Time: 06/17/23 11:30 AM  Result Value Ref Range   Sodium 133 (L) 135 - 145 mmol/L   Potassium 5.1 3.5 - 5.1 mmol/L   Chloride 103 98 - 111 mmol/L   BUN 36 (H) 8 - 23 mg/dL   Creatinine, Ser 4.09 (H) 0.61 - 1.24 mg/dL   Glucose, Bld 811 (H) 70 - 99 mg/dL    Comment: Glucose reference range applies only to samples taken after fasting for at least 8 hours.   Calcium , Ion 1.11 (L) 1.15 - 1.40 mmol/L   TCO2 23 22 - 32 mmol/L   Hemoglobin 10.5 (L) 13.0 - 17.0 g/dL   HCT 91.4 (L) 78.2 - 95.6 %  POCT Activated clotting time     Status: None   Collection Time: 06/17/23 12:04 PM  Result Value Ref Range   Activated Clotting Time 245 seconds    Comment: Reference range 74-137 seconds for patients not on anticoagulant therapy.  I-STAT 7, (LYTES, BLD GAS, ICA, H+H)     Status: Abnormal   Collection Time: 06/17/23 12:08 PM  Result Value Ref Range   pH, Arterial 7.305 (L) 7.35 - 7.45   pCO2 arterial 41.4 32 - 48 mmHg   pO2, Arterial 122 (H) 83 - 108 mmHg   Bicarbonate 21.1 20.0 - 28.0 mmol/L   TCO2 23 22 - 32 mmol/L   O2 Saturation 99 %   Acid-base deficit 5.0 (H) 0.0 - 2.0 mmol/L   Sodium 131 (L) 135 - 145 mmol/L   Potassium 5.7 (H) 3.5 - 5.1 mmol/L   Calcium , Ion 1.07 (L) 1.15 - 1.40 mmol/L   HCT 27.0 (L) 39.0 - 52.0 %   Hemoglobin 9.2 (L) 13.0 - 17.0 g/dL   Patient temperature 21.3 C    Sample type ARTERIAL   POCT Activated clotting time     Status: None    Collection Time: 06/17/23 12:37 PM  Result Value Ref Range   Activated Clotting Time 210 seconds    Comment: Reference range 74-137 seconds for patients not on anticoagulant therapy.  I-STAT 7, (LYTES, BLD GAS, ICA, H+H)     Status: Abnormal   Collection Time: 06/17/23 12:43 PM  Result Value Ref Range   pH, Arterial 7.295 (L) 7.35 - 7.45   pCO2 arterial 41.0 32 - 48 mmHg   pO2, Arterial 98 83 - 108 mmHg   Bicarbonate 20.4 20.0 - 28.0 mmol/L   TCO2 22 22 - 32 mmol/L   O2 Saturation 98 %   Acid-base deficit 6.0 (H) 0.0 - 2.0 mmol/L   Sodium 132 (L) 135 - 145 mmol/L   Potassium 6.1 (H) 3.5 - 5.1 mmol/L   Calcium , Ion 1.05 (L) 1.15 - 1.40 mmol/L   HCT 19.0 (L) 39.0 - 52.0 %   Hemoglobin 6.5 (LL) 13.0 - 17.0 g/dL   Patient temperature 08.6 C    Sample type ARTERIAL    Comment NOTIFIED PHYSICIAN   Prepare RBC (crossmatch) INTRAOP ONLY     Status: None   Collection Time: 06/17/23 12:46 PM  Result Value Ref Range   Order Confirmation      ORDER PROCESSED BY BLOOD BANK  Performed at [facility], [address] [place], [place] [address]   POCT Activated clotting time     Status: None   Collection Time: 06/17/23  1:41 PM  Result Value Ref Range   Activated Clotting Time 239 seconds    Comment: Reference range 74-137 seconds for patients not on anticoagulant therapy.  I-STAT 7, (LYTES, BLD GAS, ICA, H+H)     Status: Abnormal   Collection Time: 06/17/23  1:46 PM  Result Value Ref Range   pH, Arterial 7.259 (L) 7.35 - 7.45   pCO2 arterial 42.8 32 - 48 mmHg   pO2, Arterial 148 (H) 83 - 108 mmHg   Bicarbonate 19.6 (L) 20.0 - 28.0 mmol/L   TCO2 21 (L) 22 - 32 mmol/L   O2 Saturation 99 %   Acid-base deficit 7.0 (H) 0.0 - 2.0 mmol/L   Sodium 132 (L) 135 - 145 mmol/L   Potassium 6.7 (HH) 3.5 - 5.1 mmol/L   Calcium , Ion 0.99 (L) 1.15 - 1.40 mmol/L   HCT 22.0 (L) 39.0 - 52.0 %   Hemoglobin 7.5 (L) 13.0 - 17.0 g/dL   Patient temperature 21.3 C    Sample type ARTERIAL   I-STAT  7, (LYTES, BLD GAS, ICA, H+H)     Status: Abnormal   Collection Time: 06/17/23  2:21 PM  Result Value Ref Range   pH, Arterial 7.223 (L) 7.35 - 7.45   pCO2 arterial 46.6 32 - 48 mmHg   pO2, Arterial 121 (H) 83 - 108 mmHg   Bicarbonate 19.6 (L) 20.0 - 28.0 mmol/L   TCO2 21 (L) 22 - 32 mmol/L   O2 Saturation 98 %   Acid-base deficit 8.0 (H) 0.0 - 2.0 mmol/L   Sodium 131 (L) 135 - 145 mmol/L   Potassium 6.7 (HH) 3.5 - 5.1 mmol/L   Calcium , Ion 0.99 (L) 1.15 - 1.40 mmol/L   HCT 23.0 (L) 39.0 - 52.0 %   Hemoglobin 7.8 (L) 13.0 - 17.0 g/dL   Patient temperature 08.6 C    Sample type ARTERIAL    Comment NOTIFIED PHYSICIAN   POCT Activated clotting time     Status: None   Collection Time: 06/17/23  2:27 PM  Result Value Ref Range   Activated Clotting Time 129 seconds    Comment: Reference range 74-137 seconds for patients not on anticoagulant therapy.    HYBRID OR IMAGING ([facility] ONLY) Result Date: 06/17/2023 There is no interpretation for this exam.  This order is for images obtained during a surgical procedure.  Please See "Surgeries" Tab for more information regarding the procedure.   [facility] - [phone]- spoke to RN today Mon/Friday 3:15 EDW 91.5kg 15g BFR 400/DFR 600 Elisio 17 K3/Ca 2.5 Heparin  - none Venofer 50 weekly Mircera 30 q4wks  Assessment/PlanBogdan J [name] is an 83 y.o. male with ESRD on HD, CAD s/p CABG. HTN, HFrEF, GERD, PAD currently admitted after planned EVAR today by Dr. Fulton Job and nephrology is consulted for comanagement of ESRD and assoc conditions.   **AAA s/p EVAR today  **Hyperkalemia:  istat K intra-op 6.7, rechecking stat BMP now.  Will plan for RRT for correction tonight.   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  Daily labs - cont dialysis M/F with extra treatments as needed in post po period.  **Anemia:  Hb 9s > 6s s/p 2u pRBC in OR 7.8.  Serial H/H, transfuse < 7, will do ESA while in.  **BMM:  resume home binders/meds  when eating  Will follow, reach out with concerns.   Baron Border 06/17/2023, 3:08 PM

## 2023-06-17 NOTE — Plan of Care (Signed)
  Problem: Pain Managment: Goal: General experience of comfort will improve and/or be controlled Outcome: Progressing   Problem: Safety: Goal: Ability to remain free from injury will improve Outcome: Progressing   Problem: Skin Integrity: Goal: Risk for impaired skin integrity will decrease Outcome: Progressing

## 2023-06-17 NOTE — H&P (Signed)
 History and Physical Interval Note:  06/17/2023 8:07 AM  Ronald [name]  has presented today for surgery, with the diagnosis of infrarenal AAA without rupture.  The various methods of treatment have been discussed with the patient and family. After consideration of risks, benefits and other options for treatment, the patient has consented to  Procedure(s): INSERTION, ENDOVASCULAR STENT GRAFT, AORTA, ABDOMINAL (N/A) as a surgical intervention.  The patient's history has been reviewed, patient examined, no change in status, stable for surgery.  I have reviewed the patient's chart and labs.  Questions were answered to the patient's satisfaction.    Plan physician modified stent graft for large 12 cm AAA.  Previous EVAR and has no seal proximally.  Will plan to cover renals given ESRD.  Likely possible SMA stent and/or SMA scallop.  Discussed all of this is off label but limited options with large AAA and enlarging aneurysm..  All questions answered.  Patient wishes to proceed.  Risks and benefits discussed.  Young Hensen    Virtual Visit via Telephone Note     I connected with Ronald [name] on 05/19/2023 using the [employer] by telephone and verified that I was speaking with the correct person using two identifiers. Patient was located at home and accompanied by wife. I am located at [facility] office.   The limitations of evaluation and management by telemedicine and the availability of in person appointments have been previously discussed with the patient and are documented in the patients chart. The patient expressed understanding and consented to proceed.   PCP: Omie Bickers, MD   Chief Complaint: Discuss EVAR repair   History of Present Illness: Ronald [name] is a 83 y.o. male with history ESRD, hypertension, hyperlipidemia, CAD status post CABG, AAA repair in [place]  for a 6.4 cm AAA that presents for follow-up by phone to discuss repair of his AAA.  Previously seen by Dr.  Shirley Douglas on 01/24/2023 with a 10 cm aneurysm with no proximal attachment of his endograft.  He was referred to Dr. Myles Arvin at [facility].   The patient was under the impression that he did not have any good options at [facility].  The note from Dr. Myles Arvin suggests the patient will think about options and contact if they elect to proceed.   They wanted more time to discuss options after visit in [place] last week.       Past Medical History:  Diagnosis Date   GERD (gastroesophageal reflux disease)     Glaucoma     Hypertension     Macular degeneration     Myocardial infarction [facility])      1999   Prostate cancer (HCC) 02/2018   Renal insufficiency     Stroke [facility])      no deficits; had stroke while trying to place "stents in legs".               Past Surgical History:  Procedure Laterality Date   ABDOMINAL AORTIC ANEURYSM REPAIR   2012    IN [place]    APPENDECTOMY       AV FISTULA PLACEMENT Left 10/16/2020    Procedure: LEFT ARM ARTERIOVENOUS (AV) FISTULA CREATION;  Surgeon: Mayo Speck, MD;  Location: [facility]S;  Service: Vascular;  Laterality: Left;   CORONARY ARTERY BYPASS GRAFT   01/1998    3 vessels 18 years ago   INGUINAL HERNIA REPAIR Right 09/20/2015    Procedure: REPAIR RIGHT INGUINAL HERNIA  WITH MESH;  Surgeon:  Franki Isles, MD;  Location: [facility]S;  Service: General;  Laterality: Right;   INSERTION OF DIALYSIS CATHETER Right 08/02/2020    Procedure: INSERTION OF RIGHT TUNNELED INTERNAL JUGULAR  DIALYSIS CATHETER;  Surgeon: Mayo Speck, MD;  Location: [facility];  Service: Vascular;  Laterality: Right;   REMOVAL OF A DIALYSIS CATHETER N/A 03/19/2021    Procedure: MINOR REMOVAL OF A TUNNELED DIALYSIS CATHETER;  Surgeon: Mayo Speck, MD;  Location: [facility]S;  Service: Vascular;  Laterality: N/A;   tumor removal from bladder              Active Medications  No outpatient medications have been marked as taking for the 05/19/23 encounter (Appointment) with Young Hensen, MD.         12 system ROS was negative unless otherwise noted in HPI   Observations/Objective:   CT abdomen pelvis reviewed from 12/16/2022 with a large 12 cm AAA with no proximal attachment with type Ia endoleak of previous endograft    Assessment and Plan:   83 y.o. male, with history ESRD, hypertension, hyperlipidemia, CAD status post CABG, AAA repair in [place]  for a 6.4 cm AAA that presents for follow-up by phone to discuss repair of his AAA.  Previously seen by Dr. Shirley Douglas on 01/24/2023 with a 10 cm aneurysm with no proximal attachment of his endograft.  He was referred to Dr. Myles Arvin at [facility].   I again discussed with his wife that he has a large aneurysm is now over 12 cm in maximal diameter with no proximal seal of the endograft.  He has no protection from rupture as we discussed last week.  His rupture risk in the next year is well over 50%.  I discussed the treatment of his aneurysm here would involve off label physician modified endograft.  Discussed we could use a thoracic endograft that we will modify on the back table and then stent across his renals given dialysis to the bottom level of the SMA to try and get proximal seal.  Ultimately patient and his wife would like to proceed.  I again discussed risks and benefits including risk of anesthesia, MI, stroke, vessel injury, etc including poor surgical outcome but has limited options at this point.       Follow Up Instructions:    Follow EX:BMWUXLKG EVAR   I discussed the assessment and treatment plan with the patient. The patient was provided an opportunity to ask questions and all were answered. The patient agreed with the plan and demonstrated an understanding of the instructions.   The patient was advised to call back or seek an in-person evaluation if the symptoms worsen or if the condition fails to improve as anticipated.   I spent 10 minutes with the patient via telephone encounter.     Signed, Young Hensen [facility] [place] Office: [phone]   05/19/2023, 4:26 PM

## 2023-06-18 ENCOUNTER — Encounter (HOSPITAL_COMMUNITY): Payer: Self-pay | Admitting: Vascular Surgery

## 2023-06-18 DIAGNOSIS — I4891 Unspecified atrial fibrillation: Secondary | ICD-10-CM | POA: Diagnosis not present

## 2023-06-18 DIAGNOSIS — I10 Essential (primary) hypertension: Secondary | ICD-10-CM | POA: Diagnosis not present

## 2023-06-18 DIAGNOSIS — E785 Hyperlipidemia, unspecified: Secondary | ICD-10-CM

## 2023-06-18 DIAGNOSIS — I959 Hypotension, unspecified: Secondary | ICD-10-CM | POA: Diagnosis not present

## 2023-06-18 DIAGNOSIS — I4819 Other persistent atrial fibrillation: Secondary | ICD-10-CM

## 2023-06-18 DIAGNOSIS — I5020 Unspecified systolic (congestive) heart failure: Secondary | ICD-10-CM | POA: Diagnosis not present

## 2023-06-18 DIAGNOSIS — I2510 Atherosclerotic heart disease of native coronary artery without angina pectoris: Secondary | ICD-10-CM

## 2023-06-18 DIAGNOSIS — Z95828 Presence of other vascular implants and grafts: Secondary | ICD-10-CM

## 2023-06-18 LAB — BASIC METABOLIC PANEL WITH GFR
Anion gap: 11 (ref 5–15)
Anion gap: 15 (ref 5–15)
BUN: 42 mg/dL — ABNORMAL HIGH (ref 8–23)
BUN: 25 mg/dL — ABNORMAL HIGH (ref 8–23)
CO2: 20 mmol/L — ABNORMAL LOW (ref 22–32)
CO2: 24 mmol/L (ref 22–32)
Calcium: 8 mg/dL — ABNORMAL LOW (ref 8.9–10.3)
Calcium: 8 mg/dL — ABNORMAL LOW (ref 8.9–10.3)
Chloride: 104 mmol/L (ref 98–111)
Chloride: 95 mmol/L — ABNORMAL LOW (ref 98–111)
Creatinine, Ser: 7.58 mg/dL — ABNORMAL HIGH (ref 0.61–1.24)
Creatinine, Ser: 5.01 mg/dL — ABNORMAL HIGH (ref 0.61–1.24)
GFR, Estimated: 7 mL/min — ABNORMAL LOW (ref >60)
GFR, Estimated: 11 mL/min — ABNORMAL LOW (ref >60)
Glucose, Bld: 144 mg/dL — ABNORMAL HIGH (ref 70–99)
Glucose, Bld: 112 mg/dL — ABNORMAL HIGH (ref 70–99)
Potassium: 6.6 mmol/L (ref 3.5–5.1)
Potassium: 4.1 mmol/L (ref 3.5–5.1)
Sodium: 135 mmol/L (ref 135–145)
Sodium: 134 mmol/L — ABNORMAL LOW (ref 135–145)

## 2023-06-18 LAB — CBC
HCT: 28.8 % — ABNORMAL LOW (ref 39.0–52.0)
HCT: 22.4 % — ABNORMAL LOW (ref 39.0–52.0)
Hemoglobin: 9.5 g/dL — ABNORMAL LOW (ref 13.0–17.0)
Hemoglobin: 7.4 g/dL — ABNORMAL LOW (ref 13.0–17.0)
MCH: 29.1 pg (ref 26.0–34.0)
MCH: 29.5 pg (ref 26.0–34.0)
MCHC: 33 g/dL (ref 30.0–36.0)
MCHC: 33 g/dL (ref 30.0–36.0)
MCV: 88.1 fL (ref 80.0–100.0)
MCV: 89.2 fL (ref 80.0–100.0)
Platelets: 157 10*3/uL (ref 150–400)
Platelets: 123 10*3/uL — ABNORMAL LOW (ref 150–400)
RBC: 3.27 MIL/uL — ABNORMAL LOW (ref 4.22–5.81)
RBC: 2.51 MIL/uL — ABNORMAL LOW (ref 4.22–5.81)
RDW: 19.1 % — ABNORMAL HIGH (ref 11.5–15.5)
RDW: 19.2 % — ABNORMAL HIGH (ref 11.5–15.5)
WBC: 14.4 10*3/uL — ABNORMAL HIGH (ref 4.0–10.5)
WBC: 11 10*3/uL — ABNORMAL HIGH (ref 4.0–10.5)
nRBC: 0 % (ref 0.0–0.2)
nRBC: 0 % (ref 0.0–0.2)

## 2023-06-18 LAB — RENAL FUNCTION PANEL
Albumin: 3.2 g/dL — ABNORMAL LOW (ref 3.5–5.0)
Anion gap: 11 (ref 5–15)
BUN: 42 mg/dL — ABNORMAL HIGH (ref 8–23)
CO2: 19 mmol/L — ABNORMAL LOW (ref 22–32)
Calcium: 8 mg/dL — ABNORMAL LOW (ref 8.9–10.3)
Chloride: 103 mmol/L (ref 98–111)
Creatinine, Ser: 7.65 mg/dL — ABNORMAL HIGH (ref 0.61–1.24)
GFR, Estimated: 7 mL/min — ABNORMAL LOW (ref >60)
Glucose, Bld: 142 mg/dL — ABNORMAL HIGH (ref 70–99)
Phosphorus: 7.3 mg/dL — ABNORMAL HIGH (ref 2.5–4.6)
Potassium: 6.6 mmol/L (ref 3.5–5.1)
Sodium: 133 mmol/L — ABNORMAL LOW (ref 135–145)

## 2023-06-18 LAB — PREPARE RBC (CROSSMATCH)

## 2023-06-18 LAB — TROPONIN I (HIGH SENSITIVITY): Troponin I (High Sensitivity): 1555 ng/L (ref <18)

## 2023-06-18 MED ORDER — CHLORHEXIDINE GLUCONATE CLOTH 2 % EX PADS
6.0000 | MEDICATED_PAD | Freq: Every day | CUTANEOUS | Status: DC
  Administered 2023-06-18 – 2023-06-20 (×3): 6 via TOPICAL

## 2023-06-18 MED ORDER — AMIODARONE LOAD VIA INFUSION: 150.0000 mg | Freq: Once | INTRAVENOUS | Status: DC

## 2023-06-18 MED ORDER — ALTEPLASE 2 MG IJ SOLR: 2.0000 mg | Freq: Once | INTRAMUSCULAR | Status: DC | PRN

## 2023-06-18 MED ORDER — PENTAFLUOROPROP-TETRAFLUOROETH EX AERO: 1.0000 | INHALATION_SPRAY | CUTANEOUS | Status: DC | PRN

## 2023-06-18 MED ORDER — HEPARIN SODIUM (PORCINE) 1000 UNIT/ML DIALYSIS: 1000.0000 [IU] | INTRAMUSCULAR | Status: DC | PRN

## 2023-06-18 MED ORDER — CLOPIDOGREL BISULFATE 75 MG PO TABS
75.0000 mg | ORAL_TABLET | Freq: Every day | ORAL | Status: DC
  Administered 2023-06-19 – 2023-06-23 (×5): 75 mg via ORAL
  Filled 2023-06-18 (×5): qty 1

## 2023-06-18 MED ORDER — AMIODARONE HCL IN DEXTROSE 360-4.14 MG/200ML-% IV SOLN
INTRAVENOUS | Status: AC
  Filled 2023-06-18: qty 200

## 2023-06-18 MED ORDER — AMIODARONE LOAD VIA INFUSION
150.0000 mg | Freq: Once | INTRAVENOUS | Status: AC
Start: 2023-06-18 — End: 2023-06-18
  Administered 2023-06-18: 150 mg via INTRAVENOUS
  Filled 2023-06-18: qty 83.34

## 2023-06-18 MED ORDER — LIDOCAINE-PRILOCAINE 2.5-2.5 % EX CREA: 1.0000 | TOPICAL_CREAM | CUTANEOUS | Status: DC | PRN

## 2023-06-18 MED ORDER — AMIODARONE HCL IN DEXTROSE 360-4.14 MG/200ML-% IV SOLN
60.0000 mg/h | INTRAVENOUS | Status: AC
  Administered 2023-06-18: 60 mg/h via INTRAVENOUS
  Filled 2023-06-18: qty 200

## 2023-06-18 MED ORDER — SODIUM CHLORIDE 0.9% IV SOLUTION
Freq: Once | INTRAVENOUS | Status: DC
Start: 2023-06-18 — End: 2023-06-23

## 2023-06-18 MED ORDER — AMIODARONE IV BOLUS ONLY 150 MG/100ML: 150.0000 mg | Freq: Once | INTRAVENOUS | Status: DC

## 2023-06-18 MED ORDER — AMIODARONE IV BOLUS ONLY 150 MG/100ML
INTRAVENOUS | Status: AC
  Administered 2023-06-18: 150 mg
  Filled 2023-06-18: qty 100

## 2023-06-18 MED ORDER — AMIODARONE HCL IN DEXTROSE 360-4.14 MG/200ML-% IV SOLN
30.0000 mg/h | INTRAVENOUS | Status: DC
Start: 2023-06-18 — End: 2023-06-21
  Administered 2023-06-18 – 2023-06-21 (×5): 30 mg/h via INTRAVENOUS
  Filled 2023-06-18 (×6): qty 200

## 2023-06-18 MED ORDER — LIDOCAINE HCL (PF) 1 % IJ SOLN: 5.0000 mL | INTRAMUSCULAR | Status: DC | PRN

## 2023-06-18 MED ORDER — ANTICOAGULANT SODIUM CITRATE 4% (200MG/5ML) IV SOLN: 5.0000 mL | Status: DC | PRN

## 2023-06-18 NOTE — Consult Note (Addendum)
 Cardiology Consultation   Patient ID: Ronald [name] MRN: [id number]; DOB: 1940-12-29  Admit date: 06/17/2023 Date of Consult: 06/18/2023  PCP:  Ronald Bickers, MD   [name] Providers Cardiologist:  Ronald Mediate, MD   {      Patient Profile:   Ronald [name] is a 83 y.o. male with a hx of CAD s/p CABG in 1999, AAA s/p repair in 2013, HFmrEF, HTN, HLD, ESRD on HD, History of CVA, PAD who is being seen 06/18/2023 for the evaluation of atrial fibrillation at the request of Ronald Moulds MD.  History of Present Illness:   Mr. Ronald [name] is an 83 y.o. male with a history of CABG in 1999 in [place] , A prior cardiac cath in [place]  was aborted due to a small CVA. Had AAA repair in 2013 and had a small type 1a endoleak  The patient was initially diagnosed with a-fib on 07/2020 Was hospitalized in 11/2022 for a fib with rvr and elevated troponins in the 9000's. This was treated medically due to comorbidities. At that time an echo showed an LVEF of 40-45% mild MR, moderate AS VTI  1.1cm, G2DD. DOAC at that time was stopped due to fall risk. Is followed by Dr Ronald [name] and was last seen on 02/2023.  Recent CT abdomen has shown increased size of AAA is increasing to 12 cm. Was referred to [facility] for surgery. The patient had the impression that he did not have any good options at [facility]. With the increasing size of the AAA he was at a significant risk of rupture and elected to get endovascular stent graft surgery with Dr Fulton Job on 06/17/23. The surgical event appeared to go well. The patient did become anemic and required 2 units of RBC's the patient typically has dialysis on Wednesdays but this was skipped.   This morning the patient had hyperkalemia (6.7) and required urgent hemodialysis. During the dialysis the patient went into atrial fibrillation with rvr blood pressures are hypotensive. The most recent was 81/53.  The most recent EKG showed a fib with rvr and a rate of 127  RBBB, LAFB, inferior lateral ST depression.  On interview the patient appeared confused but the patients wife informed me that he is commonly confused after dialysis and that he was not significantly worse than baseline.  Past Medical History:  Diagnosis Date   CHF (congestive heart failure) (HCC)    GERD (gastroesophageal reflux disease)    Glaucoma    Hypertension    Macular degeneration    Myocardial infarction [facility])    1999   Prostate cancer (HCC) 02/2018   Renal insufficiency    Stroke (HCC)    no deficits; had stroke while trying to place "stents in legs".    Past Surgical History:  Procedure Laterality Date   ABDOMINAL AORTIC ANEURYSM REPAIR  2012   IN [place]    ABDOMINAL AORTIC ENDOVASCULAR STENT GRAFT N/A 06/17/2023   Procedure: INSERTION, ENDOVASCULAR STENT GRAFT, AORTA, ABDOMINAL;  Surgeon: Young Hensen, MD;  Location: [facility] OR;  Service: Vascular;  Laterality: N/A;   APPENDECTOMY     AV FISTULA PLACEMENT Left 10/16/2020   Procedure: LEFT ARM ARTERIOVENOUS (AV) FISTULA CREATION;  Surgeon: Mayo Speck, MD;  Location: [facility];  Service: Vascular;  Laterality: Left;   CORONARY ARTERY BYPASS GRAFT  01/1998   3 vessels 18 years ago   ENDARTERECTOMY FEMORAL Right 06/17/2023   Procedure: ENDARTERECTOMY, COMMON FEMORAL AND SUPERFICIAL FEMORAL;  Surgeon: Fulton Job,  Marine Sia, MD;  Location: [facility] OR;  Service: Vascular;  Laterality: Right;   INGUINAL HERNIA REPAIR Right 09/20/2015   Procedure: REPAIR RIGHT INGUINAL HERNIA  WITH MESH;  Surgeon: Franki Isles, MD;  Location: [facility];  Service: General;  Laterality: Right;   INSERTION OF DIALYSIS CATHETER Right 08/02/2020   Procedure: INSERTION OF RIGHT TUNNELED INTERNAL JUGULAR  DIALYSIS CATHETER;  Surgeon: Mayo Speck, MD;  Location: [facility];  Service: Vascular;  Laterality: Right;   INSERTION OF ILIAC STENT Right 06/17/2023   Procedure: INSERTION, STENT, ARTERY, ILIAC;  Surgeon: Young Hensen, MD;  Location: [facility] OR;   Service: Vascular;  Laterality: Right;   LOWER EXTREMITY ANGIOGRAM Right 06/17/2023   Procedure: [employer], LOWER EXTREMITY;  Surgeon: Young Hensen, MD;  Location: [facility] OR;  Service: Vascular;  Laterality: Right;   PATCH ANGIOPLASTY Right 06/17/2023   Procedure: ANGIOPLASTY, USING PATCH GRAFT USING Angelyn Barges;  Surgeon: Young Hensen, MD;  Location: [facility] OR;  Service: Vascular;  Laterality: Right;   REMOVAL OF A DIALYSIS CATHETER N/A 03/19/2021   Procedure: MINOR REMOVAL OF A TUNNELED DIALYSIS CATHETER;  Surgeon: Mayo Speck, MD;  Location: [facility];  Service: Vascular;  Laterality: N/A;   tumor removal from bladder     ULTRASOUND GUIDANCE FOR VASCULAR ACCESS Bilateral 06/17/2023   Procedure: ULTRASOUND GUIDANCE, FOR VASCULAR ACCESS;  Surgeon: Young Hensen, MD;  Location: [facility] OR;  Service: Vascular;  Laterality: Bilateral;     Home Medications:  Prior to Admission medications   Medication Sig Start Date End Date Taking? Authorizing Provider  amLODipine (NORVASC) 5 MG tablet Take 5 mg by mouth at bedtime. 01/24/23  Yes [provider]  aspirin  EC 81 MG tablet Take 1 tablet (81 mg total) by mouth daily. Swallow whole. 11/17/22  Yes Tat, Myrtie Atkinson, MD  atorvastatin  (LIPITOR) 80 MG tablet Take 80 mg by mouth in the morning. 07/05/19  Yes [provider]  calcium  acetate (PHOSLO ) 667 MG capsule Take 1,334 mg by mouth 3 (three) times daily. citracal   Yes [provider]  carvedilol  (COREG ) 25 MG tablet Take 1 tablet (25 mg total) by mouth 2 (two) times daily with a meal. 11/17/22  Yes Tat, David, MD  mirtazapine (REMERON) 15 MG tablet Take 15 mg by mouth at bedtime. 05/02/23  Yes [provider]  pantoprazole (PROTONIX) 40 MG tablet Take 40 mg by mouth in the morning.   Yes [provider]  tamsulosin  (FLOMAX ) 0.4 MG CAPS capsule Take 1 capsule (0.4 mg total) by mouth daily after supper. 12/24/22  Yes McKenzie, Arden Beck, MD  traMADol  (ULTRAM ) 50  MG tablet Take 50 mg by mouth every 8 (eight) hours as needed (pain.).   Yes [provider]  zolpidem  (AMBIEN ) 10 MG tablet Take 10 mg by mouth at bedtime as needed for sleep.   Yes [provider]    Inpatient Medications: Scheduled Meds:  amLODipine  5 mg Oral QHS   aspirin  EC  81 mg Oral Daily   atorvastatin   80 mg Oral q AM   calcium  acetate  1,334 mg Oral TID WC   carvedilol   25 mg Oral BID WC   Chlorhexidine  Gluconate Cloth  6 each Topical Q0600   Chlorhexidine  Gluconate Cloth  6 each Topical Q0600   clopidogrel  75 mg Oral Daily   docusate sodium  100 mg Oral Daily   heparin   5,000 Units Subcutaneous Q8H   mirtazapine  15 mg Oral  QHS   pantoprazole  40 mg Oral q AM   sodium chloride  flush  3 mL Intravenous Q12H   tamsulosin   0.4 mg Oral QPC supper   Continuous Infusions:  sodium chloride      sodium chloride       ceFAZolin  (ANCEF ) IV     magnesium sulfate bolus IVPB     PRN Meds: sodium chloride , sodium chloride , acetaminophen  **OR** acetaminophen , bisacodyl, guaiFENesin-dextromethorphan, hydrALAZINE , HYDROmorphone (DILAUDID) injection, labetalol , magnesium sulfate bolus IVPB, metoprolol  tartrate, ondansetron , oxyCODONE, phenol, senna-docusate, sodium chloride  flush, zolpidem   Allergies:   No Known Allergies  Social History:   Social History   Socioeconomic History   Marital status: Married    Spouse name: Not on file   Number of children: Not on file   Years of education: Not on file   Highest education level: Not on file  Occupational History   Not on file  Tobacco Use   Smoking status: Former    Current packs/day: 0.50    Average packs/day: 0.5 packs/day for 65.9 years (32.9 ttl pk-yrs)    Types: Cigarettes    Start date: 08/07/1957   Smokeless tobacco: Never   Tobacco comments:    on chantix now  Vaping Use   Vaping status: Never Used  Substance and Sexual Activity   Alcohol use: No   Drug use: No   Sexual activity: Yes  Other  Topics Concern   Not on file  Social History Narrative   Right handed   Drinks caffeine   One story home   Social Drivers of Health   Financial Resource Strain: Low Risk  (01/07/2023)   Overall Financial Resource Strain (CARDIA)    Difficulty of Paying Living Expenses: Not hard at all  Food Insecurity: Patient Declined (06/18/2023)   Hunger Vital Sign    Worried About Running Out of Food in the Last Year: Patient declined    Ran Out of Food in the Last Year: Patient declined  Transportation Needs: Patient Declined (06/18/2023)   PRAPARE - Administrator, Civil Service (Medical): Patient declined    Lack of Transportation (Non-Medical): Patient declined  Physical Activity: Unknown (11/10/2017)   Received from [facility]   Exercise Vital Sign    Days of Exercise per Week: Patient declined    Minutes of Exercise per Session: Patient declined  Stress: No Stress Concern Present (01/07/2023)   [employer] of Occupational Health - Occupational Stress Questionnaire    Feeling of Stress : Only a little  Social Connections: Patient Declined (06/18/2023)   Social Connection and Isolation Panel [NHANES]    Frequency of Communication with Friends and Family: Patient declined    Frequency of Social Gatherings with Friends and Family: Patient declined    Attends Religious Services: Patient declined    Database administrator or Organizations: Patient declined    Attends Banker Meetings: Patient declined    Marital Status: Patient declined  Intimate Partner Violence: Patient Declined (06/18/2023)   Humiliation, Afraid, Rape, and Kick questionnaire    Fear of Current or Ex-Partner: Patient declined    Emotionally Abused: Patient declined    Physically Abused: Patient declined    Sexually Abused: Patient declined    Family History:    Family History  Problem Relation Age of Onset   Cancer Father      ROS:  Please see the history of present  illness.   All other ROS reviewed and negative.  Physical Exam/Data:   Vitals:   06/18/23 1431 06/18/23 1436 06/18/23 1444 06/18/23 1450  BP: 93/72 (!) 74/49 (!) 75/51 (!) 81/55  Pulse: (!) 119 100 (!) 128 (!) 127  Resp:  13 16 20   Temp:      TempSrc: Oral     SpO2:  98% 97% 100%  Weight:      Height:        Intake/Output Summary (Last 24 hours) at 06/18/2023 1451 Last data filed at 06/18/2023 1259 Gross per 24 hour  Intake --  Output -600 ml  Net 600 ml      06/18/2023    1:09 PM 06/18/2023    8:54 AM 06/17/2023    6:41 AM  Last 3 Weights  Weight (lbs) 207 lb 3.7 oz 205 lb 14.6 oz 209 lb 3.5 oz  Weight (kg) 94 kg 93.4 kg 94.9 kg     Body mass index is 26.61 kg/m.  General:  A frail elderly man who appeared confused HEENT: normal Neck: no JVD Vascular: No carotid bruits; Distal pulses 2+ bilaterally Cardiac:  irregularly irregular and tachycardic Lungs:  clear to auscultation bilaterally, no wheezing, rhonchi or rales  Ext: no edema. Lower extremities cool but hands warm Musculoskeletal:  No deformities. Skin: warm and dry  Neuro:  no focal abnormalities noted Psych:  Normal affect   EKG:  The EKG was personally reviewed and demonstrates:   Telemetry:  Telemetry was personally reviewed and demonstrates:  Patient was initially in normal sinus rhythm. While at dialysis he went into atrial fibrillation at about 10:30 initally his rates were slower but after moving back to the floor his rates were in the 130  Relevant CV Studies: Order echo  Laboratory Data:  High Sensitivity Troponin:  No results for input(s): "TROPONINIHS" in the last 720 hours.   Chemistry Recent Labs  Lab 06/17/23 1839 06/18/23 0331 06/18/23 0333  NA 133* 135 133*  K 5.4* 6.6* 6.6*  CL 103 104 103  CO2 18* 20* 19*  GLUCOSE 156* 144* 142*  BUN 39* 42* 42*  CREATININE 7.14* 7.58* 7.65*  CALCIUM  7.5* 8.0* 8.0*  MG 1.8  --   --   GFRNONAA 7* 7* 7*  ANIONGAP 12 11 11     Recent Labs  Lab  06/17/23 1532 06/18/23 0333  ALBUMIN 2.6* 3.2*   Lipids No results for input(s): "CHOL", "TRIG", "HDL", "LABVLDL", "LDLCALC", "CHOLHDL" in the last 168 hours.  Hematology Recent Labs  Lab 06/17/23 1421 06/17/23 1839 06/18/23 0331  WBC  --  13.2* 14.4*  RBC  --  3.32* 3.27*  HGB 7.8* 9.6* 9.5*  HCT 23.0* 29.2* 28.8*  MCV  --  88.0 88.1  MCH  --  28.9 29.1  MCHC  --  32.9 33.0  RDW  --  18.7* 19.1*  PLT  --  151 157   Thyroid  No results for input(s): "TSH", "FREET4" in the last 168 hours.  BNPNo results for input(s): "BNP", "PROBNP" in the last 168 hours.  DDimer No results for input(s): "DDIMER" in the last 168 hours.   Radiology/Studies:  HYBRID OR IMAGING ([facility] ONLY) Result Date: 06/17/2023 There is no interpretation for this exam.  This order is for images obtained during a surgical procedure.  Please See "Surgeries" Tab for more information regarding the procedure.     Assessment and Plan:   Ronald [name] is a 83 y.o. male with a hx of CAD s/p CABG in 1999, AAA s/p repair in 2013,  HFmrEF, HTN, HLD, ESRD on HD, History of CVA who is being seen 06/18/2023 for the evaluation of atrial fibrillation at the request of Ronald Moulds MD.  Atrial fibrillation with RVR Hypotension -- Underwent AAA repair on 06/17/23 developed hyperkalemia that required urgent hemodialysis. During dialysis developed atrial fibrillation with RVR. Blood pressure was initially elevated but dropped significantly after going into a-fib. Appears patient only had 600mg  removed during dialysis. -- Has previously had atrial fibrillation that was initally diagnosed on 07/2020. In 11/2022 anticoagulation was stopped due to the patient falling -- electrolytes managed per nephrology, ordered TSH for tomorrow -- Stop blood pressure medications -- Start IV amiodarone.  Slow bolus then then gtt    May need more fluids or pressors if BP drops further  I woud not pursue DCCV first as afib will likely recur --  Is not a good candidate for anticoagulation with recent vascular surgery.   HFrEF   Echo in Oct 2024 LVEF 40 to 45%   HTN -- stop coreg  and amlodipine for low BP. -- order echo.  CAD    Pt s/p remote CABG in 1999 NO CP   Follow On ASA and Plavix   HL    Keep on Lipitor 80      Anemia -- Developed anemia and required transfusion with 2 units of packed RBC's. Hemoglobin is currently 9.5.   ESRD on HD -- management per nephrology.    Other conditions managed per primary   Risk Assessment/Risk Scores:      CHA2DS2-VASc Score = 6   This indicates a 9.7% annual risk of stroke. The patient's score is based upon: CHF History: 0 HTN History: 1 Diabetes History: 0 Stroke History: 2 Vascular Disease History: 1 Age Score: 2 Gender Score: 0   For questions or updates, please contact [facility] Please consult [email or web address] for contact info under    Signed, Melita Springer, PA-C  06/18/2023 2:51 PM   Pt seen and examined     I have amended note above by Gerhard Knuckles to reflect my findings  Pt is an 83 yo with hx CAD (remote CABG 1999), HFrEF, PAF   He is now s/p endovascular repair of AAA   Post op required dialysis for hyperkalemia  During dialysis developed afib with RVR PT denies palpitations   No CP  Breathing is OK BP 86/   HR 120s to 140s    Neck   No JVD Lungs are relatively clear Cardiac irreg irreg  No S3 Abd supple  Ext   NO edema   Feet cool  Afib with RVR As noted above recomm IV amiodarone --slow bolus then gtt  Pt is getting some IVF    Follow HR and BP Pt has been deemed not to be a good candidate for anticoagulation in the past due to falling      Will continue to follow

## 2023-06-18 NOTE — Progress Notes (Addendum)
 Discussed case with Dr. Fulton Job after review of afternoon labs.  I went to see the patient, and examined patient with his bedside nurse.  Patient is a [name] confused, speaking to me in his native language.  He does seem to understand me and nods his head yes and no.  On exam his abdomen is mildly tender, he has no rebound or peritoneal signs.  His right groin is ecchymotic, but soft.  I do not appreciate a hematoma or other vascular catastrophe in the right groin.  Given his difficulty with A-fib with RVR and hypotension, will transfuse the patient 2 units PRBCs.  Will check a panel of labs in the morning including a CBC.  If he has an incomplete response to transfusion, or if he has bedside nurses concerned for any clinical deterioration, we will proceed with a CT angiogram of the abdomen and pelvis.  Ronald [name]. Edgardo Goodwill, MD [facility] Phone Number: [phone] 06/18/2023 8:28 PM

## 2023-06-18 NOTE — Anesthesia Postprocedure Evaluation (Signed)
 Anesthesia Post Note  Patient: Ronald [name]  Procedure(s) Performed: INSERTION, ENDOVASCULAR STENT GRAFT, AORTA, ABDOMINAL ANGIOPLASTY, USING PATCH GRAFT USING XENOSURE 1X6CM (Right: Groin) REPAIR, ARTERY, COMMON FEMORAL (Right: Groin) ENDARTERECTOMY, COMMON FEMORAL AND SUPERFICIAL FEMORAL (Right: Groin) ULTRASOUND GUIDANCE, FOR VASCULAR ACCESS (Bilateral: Groin) INSERTION, STENT, ARTERY, ILIAC (Right: Groin) ANGIOGRAM, LOWER EXTREMITY (Right: Groin)     Patient location during evaluation: PACU Anesthesia Type: General Level of consciousness: awake and alert Pain management: pain level controlled Vital Signs Assessment: post-procedure vital signs reviewed and stable Respiratory status: spontaneous breathing, nonlabored ventilation, respiratory function stable and patient connected to nasal cannula oxygen Cardiovascular status: blood pressure returned to baseline and stable Postop Assessment: no apparent nausea or vomiting Anesthetic complications: no   There were no known notable events for this encounter.  Last Vitals:  Vitals:   06/17/23 2320 06/18/23 0858  BP: (!) 140/59 (!) 129/57  Pulse: 84 69  Resp: 20 17  Temp: 36.4 C 36.8 C  SpO2: 100% 98%    Last Pain:  Vitals:   06/18/23 0858  TempSrc: Oral  PainSc:                  Leslye Rast

## 2023-06-18 NOTE — Progress Notes (Signed)
 Lab staff at [phone] stated they have not received labs from this patient (CBC, BMP, troponin). Was transferred to [phone], with no answer. Called [phone] with no answer. MD is acquiring about these STAT orders.Will attempt to call again.  Pennye Beeghly L Zaidan Keeble, RN

## 2023-06-18 NOTE — Progress Notes (Addendum)
 Progress Note    06/18/2023 6:40 AM 1 Day Post-Op  Subjective:  says he feels cool but as far as his health, he says he feels super.  He has voided.  Denies pain in his feet.   Afebrile HR 60's-100's  140's-160's systolic 100% RA  Vitals:   06/17/23 2013 06/17/23 2320  BP: (!) 162/76 (!) 140/59  Pulse: 78 84  Resp: 20 20  Temp: (!) 95.9 F (35.5 C) 97.6 F (36.4 C)  SpO2: 92% 100%    Physical Exam: General:  sitting in bed awake, no distress Cardiac:  regular Lungs:  non labored Incisions:  right groin clean and intact; left groin soft without hematoma Extremities:  brisk PT doppler flow bilaterally Abdomen:  soft, NT  CBC    Component Value Date/Time   WBC 14.4 (H) 06/18/2023 0331   RBC 3.27 (L) 06/18/2023 0331   HGB 9.5 (L) 06/18/2023 0331   HCT 28.8 (L) 06/18/2023 0331   PLT 157 06/18/2023 0331   MCV 88.1 06/18/2023 0331   MCH 29.1 06/18/2023 0331   MCHC 33.0 06/18/2023 0331   RDW 19.1 (H) 06/18/2023 0331   LYMPHSABS 1.3 09/12/2015 1015   MONOABS 0.6 09/12/2015 1015   EOSABS 0.2 09/12/2015 1015   BASOSABS 0.0 09/12/2015 1015    BMET    Component Value Date/Time   NA 133 (L) 06/18/2023 0333   K 6.6 (HH) 06/18/2023 0333   CL 103 06/18/2023 0333   CO2 19 (L) 06/18/2023 0333   GLUCOSE 142 (H) 06/18/2023 0333   BUN 42 (H) 06/18/2023 0333   CREATININE 7.65 (H) 06/18/2023 0333   CALCIUM  8.0 (L) 06/18/2023 0333   GFRNONAA 7 (L) 06/18/2023 0333   GFRAA 15 (L) 08/10/2019 1109    INR    Component Value Date/Time   INR 1.4 (H) 06/17/2023 1839     Intake/Output Summary (Last 24 hours) at 06/18/2023 0640 Last data filed at 06/17/2023 1433 Gross per 24 hour  Intake 5400 ml  Output 1950 ml  Net 3450 ml      Assessment/Plan:  83 y.o. male is s/p:  EVAR with abdominal stent graft using a physician modified endoprosthesis (Gore 45 mm x 10 cm thoracic endograft with back table modification creating scallop for SMA), angioplasty and stent of right  external iliac artery, cutdown on the right common femoral artery for open common femoral exposure, right common femoral endarterectomy including endarterectomy of the proximal SFA and profunda, right lower extremity angiogram, endarterectomy of the proximal SFA with bovine patch angioplasty for 13cm AAA by Dr. Fulton Job 1 Day Post-Op   -pt with brisk PT doppler flow bilaterally.  Right groin incision looks good.  -pt with hyperkalemia and not on monitor.  Per RN report, pt refusing.  I have asked the RN to place him back on the monitor.   He did not get HD yesterday and plan is for HD this am.  RN reports he has had some confusion and has not been able to get in touch with his family.  Upon my arrival, pt alert and appears to not be confused.  -acute blood loss anemia-received 2 PRBC's improved to 9.5 -DVT prophylaxis:  sq heparin    Maryanna Smart, PA-C [facility] [phone] 06/18/2023 6:40 AM  I have seen and evaluated the patient. I agree with the PA note as documented above.  Postop day 1 status post physician modified endograft including SMA scallop for large 13 cm AAA.  Also required right external  iliac stenting and right common femoral cutdown including femoral and SFA endarterectomy.  Seen in dialysis.  No abdominal pain.  Right groin looks good after cutdown.  Brisk PT signals bilaterally.  Appreciate nephrology following for ESRD.  Will add Plavix given iliac stenting as well as aspirin  statin for risk reduction.  If he continues to do well could go home as early as tomorrow.  Hemoglobin 9.5.  Potassium is elevated but he is in dialysis at this time.  Young Hensen, MD [facility] [place] Office: [phone]

## 2023-06-18 NOTE — Progress Notes (Signed)
 Received patient in bed to unit.  Alert and oriented.  Informed consent signed and in chart.   TX duration: 3 hours and 15 minutes.  Patient had low BP in middle of dialysis session, Dr. Higinio Love informed and said to keep UF off.  Also alerted Dr. Higinio Love of patient going in and out of AFIB  Transported back to the room  Alert, without acute distress.  Hand-off given to patient's nurse.   Access used: Left forearm Fistula Access issues: none  Total UF removed: - Medication(s) given: 200 cc bolus to support BP   06/18/23 1259  Vitals  Temp 97.6 F (36.4 C)  Temp Source Oral  BP 100/75  MAP (mmHg) 84  ECG Heart Rate 96  Resp (!) 21  Oxygen Therapy  SpO2 98 %  O2 Device Room Air  During Treatment Monitoring  Dialysate Potassium Concentration 2  Dialysate Calcium  Concentration 2.5  Duration of HD Treatment -hour(s) 3.25 hour(s)  HD Safety Checks Performed Yes  Intra-Hemodialysis Comments Tx completed  Dialysis Fluid Bolus Normal Saline  Bolus Amount (mL) 300 mL  Post Treatment  Dialyzer Clearance Lightly streaked  Liters Processed 68.2  Fluid Removed (mL) -600 mL  Tolerated HD Treatment No (Comment)  Post-Hemodialysis Comments Patient BP low during middle of treatment.  UF off per Dr. Higinio Love  AVG/AVF Arterial Site Held (minutes) 7 minutes  AVG/AVF Venous Site Held (minutes) 7 minutes  Fistula / Graft Left Forearm Arteriovenous fistula  No placement date or time found.   Placed prior to admission: Yes  Orientation: Left  Access Location: Forearm  Access Type: Arteriovenous fistula  Status Deaccessed     Luciano Ruths LPN [facility]

## 2023-06-18 NOTE — Progress Notes (Signed)
 Received call from CCMD to inform patient is off the monitor. Upon entering the patient's room, noted patient had removed telemetry and stated he doesn't want or need it. Also refused foley removal. Assessed patient's orientation and found him oriented only to self. Called CCMD and placed patient on standby due to refusal of telemetry. Advised patient that foley would be removed since it is an infection risk. Will continue to monitor.

## 2023-06-18 NOTE — Progress Notes (Addendum)
 [facility] Progress Note   Subjective:   seen in room - getting up to urinate.  Called for RN to assist.  Pt self d/c'd tele.  Spoke to HD staff at outpt clinic yesterday - he has some issues with cognitive impairment and require reorientation at times there.  He is going for HD this AM urgent due to K, post op K was normal so he did not require HD last PM.  He has no complaints.   Objective Vitals:   06/17/23 1745 06/17/23 1800 06/17/23 2013 06/17/23 2320  BP: (!) 132/55 (!) 141/60 (!) 162/76 (!) 140/59  Pulse: 62 62 78 84  Resp: 13 15 20 20   Temp:   (!) 95.9 F (35.5 C) 97.6 F (36.4 C)  TempSrc:   Axillary Oral  SpO2: 100% 100% 92% 100%  Weight:      Height:       Physical Exam General: looking well at edge of bed Heart:RRR Lungs:clear on RA Extremities: no edema Dialysis Access:  L RC AVF +t/b  Additional Objective Labs: Basic Metabolic Panel: Recent Labs  Lab 06/17/23 1532 06/17/23 1839 06/18/23 0331 06/18/23 0333  NA 134* 133* 135 133*  K 5.1 5.4* 6.6* 6.6*  CL 102 103 104 103  CO2 18* 18* 20* 19*  GLUCOSE 241* 156* 144* 142*  BUN 36* 39* 42* 42*  CREATININE 6.90* 7.14* 7.58* 7.65*  CALCIUM  7.4* 7.5* 8.0* 8.0*  PHOS 6.1*  --   --  7.3*   Liver Function Tests: Recent Labs  Lab 06/17/23 1532 06/18/23 0333  ALBUMIN 2.6* 3.2*   No results for input(s): "LIPASE", "AMYLASE" in the last 168 hours. CBC: Recent Labs  Lab 06/17/23 1421 06/17/23 1839 06/18/23 0331  WBC  --  13.2* 14.4*  HGB 7.8* 9.6* 9.5*  HCT 23.0* 29.2* 28.8*  MCV  --  88.0 88.1  PLT  --  151 157   Blood Culture No results found for: "SDES", "SPECREQUEST", "CULT", "REPTSTATUS"  Cardiac Enzymes: No results for input(s): "CKTOTAL", "CKMB", "CKMBINDEX", "TROPONINI" in the last 168 hours. CBG: No results for input(s): "GLUCAP" in the last 168 hours. Iron Studies: No results for input(s): "IRON", "TIBC", "TRANSFERRIN", "FERRITIN" in the last 72  hours. @lablastinr3 @ Studies/Results: HYBRID OR IMAGING ([facility] ONLY) Result Date: 06/17/2023 There is no interpretation for this exam.  This order is for images obtained during a surgical procedure.  Please See "Surgeries" Tab for more information regarding the procedure.   Medications:  sodium chloride      sodium chloride      anticoagulant sodium citrate      ceFAZolin  (ANCEF ) IV     magnesium sulfate bolus IVPB      amLODipine  5 mg Oral QHS   aspirin  EC  81 mg Oral Daily   atorvastatin   80 mg Oral q AM   calcium  acetate  1,334 mg Oral TID WC   carvedilol   25 mg Oral BID WC   Chlorhexidine  Gluconate Cloth  6 each Topical Q0600   Chlorhexidine  Gluconate Cloth  6 each Topical Q0600   docusate sodium  100 mg Oral Daily   heparin   5,000 Units Subcutaneous Q8H   mirtazapine  15 mg Oral QHS   pantoprazole  40 mg Oral q AM   sodium chloride  flush  3 mL Intravenous Q12H   tamsulosin   0.4 mg Oral QPC supper    Assessment/PlanBogdan J [name] is an 83 y.o. male with ESRD on HD, CAD s/p CABG. HTN, HFrEF, GERD,  PAD currently admitted after planned EVAR today by Dr. Fulton Job and nephrology is consulted for comanagement of ESRD and assoc conditions.    **AAA s/p EVAR 5/8: per [facility]   **Hyperkalemia:  Urgent HD this AM for hyperK.  Renal diet. Serial labs.   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  Extra HD today for K.    **Anemia:  Hb 9s > 6s s/p 2u pRBC in OR > 9s this AM.  Serial H/H, transfuse < 7.   **BMM:  on home binder.  Ca normal.  PHos 7.3.    Will follow, reach out with concerns.   Adrian Alba MD 06/18/2023, 8:34 AM  [facility] Pager: [phone]

## 2023-06-18 NOTE — Progress Notes (Signed)
 Pt receives out-pt HD at [facility] on Monday and Friday. Pt has a 10:15 am chair time. Will assist as needed.   Lauraine Polite Renal Navigator [phone]

## 2023-06-18 NOTE — Progress Notes (Signed)
 Notified cardiology of critical troponin. Will continue to monitor.

## 2023-06-18 NOTE — Progress Notes (Signed)
 Patient HR 116-145 in afib. EKG done. Rapid response notified. Dr. Fulton Job in room presently.

## 2023-06-19 ENCOUNTER — Inpatient Hospital Stay (HOSPITAL_COMMUNITY)

## 2023-06-19 ENCOUNTER — Other Ambulatory Visit: Payer: Self-pay | Admitting: *Deleted

## 2023-06-19 ENCOUNTER — Encounter: Payer: Self-pay | Admitting: *Deleted

## 2023-06-19 DIAGNOSIS — I10 Essential (primary) hypertension: Secondary | ICD-10-CM | POA: Diagnosis not present

## 2023-06-19 DIAGNOSIS — I5020 Unspecified systolic (congestive) heart failure: Secondary | ICD-10-CM | POA: Diagnosis not present

## 2023-06-19 DIAGNOSIS — I4891 Unspecified atrial fibrillation: Secondary | ICD-10-CM | POA: Diagnosis not present

## 2023-06-19 DIAGNOSIS — I959 Hypotension, unspecified: Secondary | ICD-10-CM | POA: Diagnosis not present

## 2023-06-19 DIAGNOSIS — I714 Abdominal aortic aneurysm, without rupture, unspecified: Secondary | ICD-10-CM | POA: Diagnosis not present

## 2023-06-19 LAB — RENAL FUNCTION PANEL
Albumin: 2.9 g/dL — ABNORMAL LOW (ref 3.5–5.0)
Anion gap: 16 — ABNORMAL HIGH (ref 5–15)
BUN: 35 mg/dL — ABNORMAL HIGH (ref 8–23)
CO2: 22 mmol/L (ref 22–32)
Calcium: 8.1 mg/dL — ABNORMAL LOW (ref 8.9–10.3)
Chloride: 95 mmol/L — ABNORMAL LOW (ref 98–111)
Creatinine, Ser: 6.38 mg/dL — ABNORMAL HIGH (ref 0.61–1.24)
GFR, Estimated: 8 mL/min — ABNORMAL LOW (ref >60)
Glucose, Bld: 111 mg/dL — ABNORMAL HIGH (ref 70–99)
Phosphorus: 7.8 mg/dL — ABNORMAL HIGH (ref 2.5–4.6)
Potassium: 4.8 mmol/L (ref 3.5–5.1)
Sodium: 133 mmol/L — ABNORMAL LOW (ref 135–145)

## 2023-06-19 LAB — TSH: TSH: 3.984 u[IU]/mL (ref 0.350–4.500)

## 2023-06-19 LAB — GLUCOSE, CAPILLARY: Glucose-Capillary: 113 mg/dL — ABNORMAL HIGH (ref 70–99)

## 2023-06-19 LAB — CBC
HCT: 29.1 % — ABNORMAL LOW (ref 39.0–52.0)
Hemoglobin: 9.4 g/dL — ABNORMAL LOW (ref 13.0–17.0)
MCH: 28.4 pg (ref 26.0–34.0)
MCHC: 32.3 g/dL (ref 30.0–36.0)
MCV: 87.9 fL (ref 80.0–100.0)
Platelets: 118 10*3/uL — ABNORMAL LOW (ref 150–400)
RBC: 3.31 MIL/uL — ABNORMAL LOW (ref 4.22–5.81)
RDW: 18.8 % — ABNORMAL HIGH (ref 11.5–15.5)
WBC: 12.9 10*3/uL — ABNORMAL HIGH (ref 4.0–10.5)
nRBC: 0 % (ref 0.0–0.2)

## 2023-06-19 MED ORDER — IOHEXOL 350 MG/ML SOLN
100.0000 mL | Freq: Once | INTRAVENOUS | Status: AC | PRN
  Administered 2023-06-19: 100 mL via INTRAVENOUS

## 2023-06-19 NOTE — Consult Note (Signed)
 Initial Consultation Note   Patient: Ronald [name] ZOX:[id number] DOB: [birth year] PCP: Omie Bickers, MD DOA: 06/17/2023 DOS: the patient was seen and examined on 06/19/2023 Primary service: Young Hensen, MD  Referring physician: Dr. Fulton Job and Susi Eric Reason for consult: Medical management, delirium  Assessment/Plan: Abdominal aortic aneurysm status post EVAR with abdominal stent graft using modified endoprosthesis, angioplasty and stenting, cutdown of the right common femoral artery, common femoral artery endarterectomy, and angiogram by Dr. Fulton Job, 5/7 Postop care per vascular surgery -Aspirin  and Plavix  per vascular surgery in setting of retroperitoneal hematoma - Continue Lipitor    Delirium Mild dementia At baseline, patient lives with wife, is forgetful, poor short-term memory loss, but is usually oriented to situation, place, familiar faces.  He has been intermittently delirious, and confused. - Standard delirium precautions: blinds open and lights on during day, TV off, minimize interruptions at night, glasses/hearing aids, PT/OT, avoiding Beers list medications   -Continue mirtazapine  -stop Ambien    Acute anemia due to retroperitoneal hematoma Hemoglobin postop 9.6, dropped to 7.4 yesterday, transfused 1 unit overnight, hemoglobin up to 9.4 today.  CT angiogram today shows a 8 x 2 cm retroperitoneal hematoma in the right. - Agree with transfusion threshold of 8 g/dL - Iron per cardiology -Defer aspirin , Plavix  to vascular surgery   End-stage renal disease on dialysis  Hyperkalemia Hyperphosphatemia - HD per nephrology   Coronary artery disease Elevated troponin hsTroponin up to greater than thousand.  No chest pain but new ST depressions on ECG.   - Defer work up to Cardiology - Consult Cardiology   Hypertension Chronic systolic congestive heart failure Peripheral vascular disease Cerebrovascular disease Last EF 40 to 45%, moderate aortic  stenosis.  Grade 2 diastolic dysfunction. -Hold amlodipine , carvedilol    Atrial fibrillation, paroxysmal She has a prior history of atrial fibrillation, not on anticoagulation due to dementia, fall risk.  Rapid rate yesterday, cardiology consulted.  Rates now controlled. -Follow-up TSH - Consult Cardiology -Continue amiodarone  infusion   BPH - On Flomax   Hyponatremia Mild, asymptomatic, no change to management.     HPI: 83 y.o. M with ESRD on HD, dementia, HTN, stroke, CAD s/p CABG remote and Afib not on AC due to dementia, fall risk who presented for elective AAA repair.   Underwent complex repair of 13(!) cm AAA on 5/7 by Dr. Fulton Job.    Post-op course complicated by hyperkalemia, acute anemia, delirium, and troponin elevation.   Hospitalist service consulted for assistance with medical management on 5/9.       Review of Systems: Review of Systems  Constitutional:  Negative for chills and fever.  Gastrointestinal:  Positive for abdominal pain.  Neurological:  Negative for focal weakness and loss of consciousness.       Confusion/delirium  All other systems reviewed and are negative.   Past Medical History:  Diagnosis Date   CHF (congestive heart failure) (HCC)    GERD (gastroesophageal reflux disease)    Glaucoma    Hypertension    Macular degeneration    Myocardial infarction [facility])    1999   Prostate cancer (HCC) 02/2018   Renal insufficiency    Stroke (HCC)    no deficits; had stroke while trying to place "stents in legs".   Past Surgical History:  Procedure Laterality Date   ABDOMINAL AORTIC ANEURYSM REPAIR  2012   IN [place]    ABDOMINAL AORTIC ENDOVASCULAR STENT GRAFT N/A 06/17/2023   Procedure: INSERTION, ENDOVASCULAR STENT GRAFT, AORTA, ABDOMINAL;  Surgeon: Jimmye Moulds  J, MD;  Location: [facility] OR;  Service: Vascular;  Laterality: N/A;   APPENDECTOMY     AV FISTULA PLACEMENT Left 10/16/2020   Procedure: LEFT ARM ARTERIOVENOUS (AV) FISTULA  CREATION;  Surgeon: Mayo Speck, MD;  Location: [facility] ORS;  Service: Vascular;  Laterality: Left;   CORONARY ARTERY BYPASS GRAFT  01/1998   3 vessels 18 years ago   ENDARTERECTOMY FEMORAL Right 06/17/2023   Procedure: ENDARTERECTOMY, COMMON FEMORAL AND SUPERFICIAL FEMORAL;  Surgeon: Young Hensen, MD;  Location: [facility] OR;  Service: Vascular;  Laterality: Right;   INGUINAL HERNIA REPAIR Right 09/20/2015   Procedure: REPAIR RIGHT INGUINAL HERNIA  WITH MESH;  Surgeon: Franki Isles, MD;  Location: [facility] ORS;  Service: General;  Laterality: Right;   INSERTION OF DIALYSIS CATHETER Right 08/02/2020   Procedure: INSERTION OF RIGHT TUNNELED INTERNAL JUGULAR  DIALYSIS CATHETER;  Surgeon: Mayo Speck, MD;  Location: [facility];  Service: Vascular;  Laterality: Right;   INSERTION OF ILIAC STENT Right 06/17/2023   Procedure: INSERTION, STENT, ARTERY, ILIAC;  Surgeon: Young Hensen, MD;  Location: [facility] OR;  Service: Vascular;  Laterality: Right;   LOWER EXTREMITY ANGIOGRAM Right 06/17/2023   Procedure: [employer], LOWER EXTREMITY;  Surgeon: Young Hensen, MD;  Location: [facility] OR;  Service: Vascular;  Laterality: Right;   PATCH ANGIOPLASTY Right 06/17/2023   Procedure: ANGIOPLASTY, USING PATCH GRAFT USING Angelyn Barges;  Surgeon: Young Hensen, MD;  Location: [facility];  Service: Vascular;  Laterality: Right;   REMOVAL OF A DIALYSIS CATHETER N/A 03/19/2021   Procedure: MINOR REMOVAL OF A TUNNELED DIALYSIS CATHETER;  Surgeon: Mayo Speck, MD;  Location: [facility];  Service: Vascular;  Laterality: N/A;   tumor removal from bladder     ULTRASOUND GUIDANCE FOR VASCULAR ACCESS Bilateral 06/17/2023   Procedure: ULTRASOUND GUIDANCE, FOR VASCULAR ACCESS;  Surgeon: Young Hensen, MD;  Location: [facility];  Service: Vascular;  Laterality: Bilateral;   Social History:  reports that he has quit smoking. His smoking use included cigarettes. He started smoking about 65 years ago. He has a 32.9 pack-year smoking history.  He has never used smokeless tobacco. He reports that he does not drink alcohol and does not use drugs.  No Known Allergies  Family History  Problem Relation Age of Onset   Cancer Father     Prior to Admission medications   Medication Sig Start Date End Date Taking? Authorizing Provider  amLODipine  (NORVASC ) 5 MG tablet Take 5 mg by mouth at bedtime. 01/24/23  Yes [provider]  aspirin  EC 81 MG tablet Take 1 tablet (81 mg total) by mouth daily. Swallow whole. 11/17/22  Yes Tat, Myrtie Atkinson, MD  atorvastatin  (LIPITOR) 80 MG tablet Take 80 mg by mouth in the morning. 07/05/19  Yes [provider]  calcium  acetate (PHOSLO ) 667 MG capsule Take 1,334 mg by mouth 3 (three) times daily. citracal   Yes [provider]  carvedilol  (COREG ) 25 MG tablet Take 1 tablet (25 mg total) by mouth 2 (two) times daily with a meal. 11/17/22  Yes Tat, David, MD  mirtazapine  (REMERON ) 15 MG tablet Take 15 mg by mouth at bedtime. 05/02/23  Yes [provider]  pantoprazole  (PROTONIX ) 40 MG tablet Take 40 mg by mouth in the morning.   Yes [provider]  tamsulosin  (FLOMAX ) 0.4 MG CAPS capsule Take 1 capsule (0.4 mg total) by mouth daily after supper. 12/24/22  Yes McKenzie, Arden Beck, MD  traMADol  (ULTRAM ) 50 MG  tablet Take 50 mg by mouth every 8 (eight) hours as needed (pain.).   Yes [provider]  zolpidem  (AMBIEN ) 10 MG tablet Take 10 mg by mouth at bedtime as needed for sleep.   Yes [provider]    Physical Exam: Vitals:   06/19/23 0331 06/19/23 0352 06/19/23 0530 06/19/23 0719  BP: (!) 132/59 115/66 123/63 121/65  Pulse: 83 91 87 79  Resp:   15 16  Temp: 97.8 F (36.6 C) 97.8 F (36.6 C) 98.1 F (36.7 C) 98.2 F (36.8 C)  TempSrc: Axillary Axillary Oral Oral  SpO2:    98%  Weight:      Height:       General appearance: Elderly adult male, sitting up in bed, interactive and appropriate, speaks sometimes in [place], sometimes in a foreign  language.  Understands me well. HEENT: Anicteric, conjunctival pink, lids and lashes normal.  No nasal forming, discharge, or epistaxis, oropharynx moist, no oral lesions Cor: Irregular rhythm, no murmurs that I can appreciate, JVP not visible, no pitting edema in the extremities Resp:  Normal respiratory rate and rhythm.  CTAB without rales or wheezes. Abd: Abdomen soft, tenderness on the right, no masses or organomegaly appreciated, no ascites or distention  MSK: Symmetrical without gross deformities of the hands, large joints, or legs. Skin:  cap refill normal, Skin intact without significant rashes or lesions. Neuro: He accent, but speech is fluent, naming is grossly intact, upper extremity strength appears symmetric, face symmetric  Psych: Seems and responsive to questions, may be somewhat distracted, concentration reduced, thought process tangential, short-term memory seems impaired, affect pleasant      Data Reviewed:  CT angiogram report reviewed, shows right retroperitoneal hematoma CBC shows hemoglobin up to 9.4 post transfusion, platelets 119 Basic metabolic panel shows mild hyponatremia, elevated creatinine consistent with end-stage renal disease, normal potassium, elevated phosphorus   Family Communication: Wife at the bedside  Primary team communication: Vascular surgery PA Thank you very much for involving us  in the care of your patient.  Author: Ephriam Hashimoto, MD 06/19/2023 11:10 AM  For on call review [email or web address].

## 2023-06-19 NOTE — Patient Outreach (Signed)
 Duplicate note for unsuccessful outreach  Cerra Eisenhower L. Mcarthur Speedy, RN, BSN, CCM [facility], [facility] RN Care Manager Direct Dial: [phone]  Fax: [phone]

## 2023-06-19 NOTE — Progress Notes (Addendum)
 Progress Note    06/19/2023 6:30 AM 2 Days Post-Op  Subjective:  confused this morning.  C/o pain in right lower abdomen.  RN reports transfusion just finished around 5am (labs were before this)   Afebrile HR 70's-100's  80's-120's systolic 91% RA  Vitals:   06/19/23 0352 06/19/23 0530  BP: 115/66 123/63  Pulse: 91 87  Resp:  15  Temp: 97.8 F (36.6 C) 98.1 F (36.7 C)  SpO2:      Physical Exam: General:  no distress Cardiac:  regular (afib converted overnight) Lungs:  non labored Incisions:  right groin incision looks fine; left groin without hematoma Extremities:  +PT doppler flow bilaterally Abdomen:  tender to palpation right lower quadrant.  CBC    Component Value Date/Time   WBC 11.0 (H) 06/18/2023 1832   RBC 2.51 (L) 06/18/2023 1832   HGB 7.4 (L) 06/18/2023 1832   HCT 22.4 (L) 06/18/2023 1832   PLT 123 (L) 06/18/2023 1832   MCV 89.2 06/18/2023 1832   MCH 29.5 06/18/2023 1832   MCHC 33.0 06/18/2023 1832   RDW 19.2 (H) 06/18/2023 1832   LYMPHSABS 1.3 09/12/2015 1015   MONOABS 0.6 09/12/2015 1015   EOSABS 0.2 09/12/2015 1015   BASOSABS 0.0 09/12/2015 1015    BMET    Component Value Date/Time   NA 134 (L) 06/18/2023 1832   K 4.1 06/18/2023 1832   CL 95 (L) 06/18/2023 1832   CO2 24 06/18/2023 1832   GLUCOSE 112 (H) 06/18/2023 1832   BUN 25 (H) 06/18/2023 1832   CREATININE 5.01 (H) 06/18/2023 1832   CALCIUM  8.0 (L) 06/18/2023 1832   GFRNONAA 11 (L) 06/18/2023 1832   GFRAA 15 (L) 08/10/2019 1109    INR    Component Value Date/Time   INR 1.4 (H) 06/17/2023 1839     Intake/Output Summary (Last 24 hours) at 06/19/2023 0630 Last data filed at 06/19/2023 0530 Gross per 24 hour  Intake 915.86 ml  Output -600 ml  Net 1515.86 ml      Assessment/Plan:  83 y.o. male is s/p:  EVAR with abdominal stent graft using a physician modified endoprosthesis (Gore 45 mm x 10 cm thoracic endograft with back table modification creating scallop for SMA),  angioplasty and stent of right external iliac artery, cutdown on the right common femoral artery for open common femoral exposure, right common femoral endarterectomy including endarterectomy of the proximal SFA and profunda, right lower extremity angiogram, endarterectomy of the proximal SFA with bovine patch angioplasty for 13cm AAA by Dr. Fulton Job   2 Days Post-Op   -pt with + PT doppler flow -right groin looks fine and soft without hematoma.  He does have tenderness to palpation in the RLQ.  Stat CT was ordered last evening but not done.  I have re-ordered this this morning.   -pt following commands this morning, however, he is confused and not oriented to place or president but does know the year.  He is speaking some in [place] but also his native language as well.   -hyperkalemia improved this am after having HD yesterday -troponin > 1500 last evening.  Cards was paged.  Pt was in afib with RVR but converted and now in NSR.   -acute blood loss anemia-transfusion ordered last evening-transfusion finished this morning.  Will get CBC around lunch time.   -plavix  started yesterday for iliac stent -given multiple medical issues, will consult hospitalist to help manage patient.   -DVT prophylaxis:  sq heparin -will hold this  am until CTA results.  I have discussed with RN.   Maryanna Smart, PA-C [facility] [phone] 06/19/2023 6:30 AM  VASCULAR STAFF ADDENDUM: I have independently interviewed and examined the patient. I agree with the above.  Yesterday during dialysis went into A-fib RVR with hypotension.  Amiodarone  was started and he received 2 units of PRBC due to a hemoglobin drop to 7.4 from 9.5. Hemodynamically stable this morning with heart rates in the 80s and systolics in the 120s. Moderately tender in right lower quadrant and right flank.  A CTA was ordered last night due to increased pain in this region although was not performed.  It is reordered this  morning. Multiphasic PT bilaterally, left without any issue.  Right groin with ecchymosis but no hematoma or drainage. Will follow-up labs and CTA.  Philipp Brawn MD [facility] Phone Number: [phone] 06/19/2023 8:26 AM

## 2023-06-19 NOTE — Progress Notes (Addendum)
 [facility] Progress Note   Subjective:   seen in room - looks comfortable and calm but mumbling answers.  D/w RN - pain in abd overnight and CT pending.  Appears he had additional transfusion overnight.  AM labs not drawn yet - RN to request.    Objective Vitals:   06/19/23 0331 06/19/23 0352 06/19/23 0530 06/19/23 0719  BP: (!) 132/59 115/66 123/63 121/65  Pulse: 83 91 87 79  Resp:   15 16  Temp: 97.8 F (36.6 C) 97.8 F (36.6 C) 98.1 F (36.7 C) 98.2 F (36.8 C)  TempSrc: Axillary Axillary Oral Oral  SpO2:    98%  Weight:      Height:       Physical Exam General: looking comfortable lying in bed Heart:RRR Lungs:clear on RA Extremities: no edema Dialysis Access:  L RC AVF +t/b  Additional Objective Labs: Basic Metabolic Panel: Recent Labs  Lab 06/17/23 1532 06/17/23 1839 06/18/23 0331 06/18/23 0333 06/18/23 1832  NA 134*   < > 135 133* 134*  K 5.1   < > 6.6* 6.6* 4.1  CL 102   < > 104 103 95*  CO2 18*   < > 20* 19* 24  GLUCOSE 241*   < > 144* 142* 112*  BUN 36*   < > 42* 42* 25*  CREATININE 6.90*   < > 7.58* 7.65* 5.01*  CALCIUM  7.4*   < > 8.0* 8.0* 8.0*  PHOS 6.1*  --   --  7.3*  --    < > = values in this interval not displayed.   Liver Function Tests: Recent Labs  Lab 06/17/23 1532 06/18/23 0333  ALBUMIN  2.6* 3.2*   No results for input(s): "LIPASE", "AMYLASE" in the last 168 hours. CBC: Recent Labs  Lab 06/17/23 1839 06/18/23 0331 06/18/23 1832  WBC 13.2* 14.4* 11.0*  HGB 9.6* 9.5* 7.4*  HCT 29.2* 28.8* 22.4*  MCV 88.0 88.1 89.2  PLT 151 157 123*   Blood Culture No results found for: "SDES", "SPECREQUEST", "CULT", "REPTSTATUS"  Cardiac Enzymes: No results for input(s): "CKTOTAL", "CKMB", "CKMBINDEX", "TROPONINI" in the last 168 hours. CBG: No results for input(s): "GLUCAP" in the last 168 hours. Iron Studies: No results for input(s): "IRON", "TIBC", "TRANSFERRIN", "FERRITIN" in the last 72  hours. @lablastinr3 @ Studies/Results: No results found.  Medications:  sodium chloride      amiodarone  30 mg/hr (06/18/23 2335)   magnesium  sulfate bolus IVPB      sodium chloride    Intravenous Once   aspirin  EC  81 mg Oral Daily   atorvastatin   80 mg Oral q AM   calcium  acetate  1,334 mg Oral TID WC   Chlorhexidine  Gluconate Cloth  6 each Topical Q0600   Chlorhexidine  Gluconate Cloth  6 each Topical Q0600   clopidogrel   75 mg Oral Daily   docusate sodium   100 mg Oral Daily   heparin   5,000 Units Subcutaneous Q8H   mirtazapine   15 mg Oral QHS   pantoprazole   40 mg Oral q AM   sodium chloride  flush  3 mL Intravenous Q12H   tamsulosin   0.4 mg Oral QPC supper    Assessment/PlanBogdan J [name] is an 83 y.o. male with ESRD on HD, CAD s/p CABG. HTN, HFrEF, GERD, PAD currently admitted after planned EVAR today by Dr. Fulton Job and nephrology is consulted for comanagement of ESRD and assoc conditions.    **AAA s/p EVAR 5/8: per [facility]   **Hyperkalemia:  Urgent HD Thurs AM for  K.  Needs AM labs - as above d/w RN.    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  Can do tx today if needed based on labs but has had 2tx this week already so if labs fine I won't do a third today.    **Anemia:  ABLA + anemia of ESRD.  Serial H/H, transfuse < 7. On ESA.  REceived additional transfusion overnight, repeat H/H ordered.    **BMM:  on home binder.  Ca normal.  PHos 7.3.    Will follow, reach out with concerns.   ADDENDUM:  rev'd daily labs - BMP looks good, H/H improved after transfusion.  No reason for dialysis today.  Would plan next HD Monday per usual schedule.  If remains admitted over weekend would follow labs but o/w ok for d/c from nephrology perspective.   Adrian Alba MD 06/19/2023, 8:44 AM  [facility] Pager: [phone]

## 2023-06-19 NOTE — Care Management Important Message (Signed)
 Important Message  Patient Details  Name: Ronald [name] MRN: [id number] Date of Birth: 1940/07/29   Important Message Given:  Yes - Medicare IM     Felix Host 06/19/2023, 2:34 PM

## 2023-06-19 NOTE — Progress Notes (Signed)
 Rounding Note    Patient Name: Ronald [name] Date of Encounter: 06/19/2023  [facility] Cardiologist: Janelle Mediate, MD   Subjective   Pt on table for CT scan    Denies CP  Breathing has been better   Inpatient Medications    Scheduled Meds:  sodium chloride    Intravenous Once   aspirin  EC  81 mg Oral Daily   atorvastatin   80 mg Oral q AM   calcium  acetate  1,334 mg Oral TID WC   Chlorhexidine  Gluconate Cloth  6 each Topical Q0600   Chlorhexidine  Gluconate Cloth  6 each Topical Q0600   clopidogrel   75 mg Oral Daily   docusate sodium   100 mg Oral Daily   heparin   5,000 Units Subcutaneous Q8H   mirtazapine   15 mg Oral QHS   pantoprazole   40 mg Oral q AM   sodium chloride  flush  3 mL Intravenous Q12H   tamsulosin   0.4 mg Oral QPC supper   Continuous Infusions:  sodium chloride      amiodarone  30 mg/hr (06/18/23 2335)   magnesium  sulfate bolus IVPB     PRN Meds: sodium chloride , acetaminophen  **OR** acetaminophen , bisacodyl , guaiFENesin -dextromethorphan, HYDROmorphone  (DILAUDID ) injection, magnesium  sulfate bolus IVPB, metoprolol  tartrate, ondansetron , oxyCODONE , phenol, senna-docusate, sodium chloride  flush, zolpidem    Vital Signs    Vitals:   06/19/23 0130 06/19/23 0331 06/19/23 0352 06/19/23 0530  BP: 118/63 (!) 132/59 115/66 123/63  Pulse: 79 83 91 87  Resp:    15  Temp: 98.4 F (36.9 C) 97.8 F (36.6 C) 97.8 F (36.6 C) 98.1 F (36.7 C)  TempSrc: Axillary Axillary Axillary Oral  SpO2:      Weight:      Height:        Intake/Output Summary (Last 24 hours) at 06/19/2023 0704 Last data filed at 06/19/2023 0530 Gross per 24 hour  Intake 915.86 ml  Output -600 ml  Net 1515.86 ml      06/18/2023    1:09 PM 06/18/2023    8:54 AM 06/17/2023    6:41 AM  Last 3 Weights  Weight (lbs) 207 lb 3.7 oz 205 lb 14.6 oz 209 lb 3.5 oz  Weight (kg) 94 kg 93.4 kg 94.9 kg      Telemetry    Atrial fibrillation 80s  - Personally Reviewed  ECG    No new  -  Personally Reviewed  Physical Exam   GEN: No acute distress.   Neck: No JVD Cardiac: Irreg irreg   No S3  No murmurs  Respiratory: Relatively CTA  GI: Soft obese  MS: No LE edema; Labs    High Sensitivity Troponin:   Recent Labs  Lab 06/18/23 1832  TROPONINIHS 1,555*     Chemistry Recent Labs  Lab 06/17/23 1532 06/17/23 1839 06/18/23 0331 06/18/23 0333 06/18/23 1832  NA 134* 133* 135 133* 134*  K 5.1 5.4* 6.6* 6.6* 4.1  CL 102 103 104 103 95*  CO2 18* 18* 20* 19* 24  GLUCOSE 241* 156* 144* 142* 112*  BUN 36* 39* 42* 42* 25*  CREATININE 6.90* 7.14* 7.58* 7.65* 5.01*  CALCIUM  7.4* 7.5* 8.0* 8.0* 8.0*  MG  --  1.8  --   --   --   ALBUMIN  2.6*  --   --  3.2*  --   GFRNONAA 7* 7* 7* 7* 11*  ANIONGAP 14 12 11 11 15     Lipids No results for input(s): "CHOL", "TRIG", "HDL", "LABVLDL", "LDLCALC", "CHOLHDL" in the  last 168 hours.  Hematology Recent Labs  Lab 06/17/23 1839 06/18/23 0331 06/18/23 1832  WBC 13.2* 14.4* 11.0*  RBC 3.32* 3.27* 2.51*  HGB 9.6* 9.5* 7.4*  HCT 29.2* 28.8* 22.4*  MCV 88.0 88.1 89.2  MCH 28.9 29.1 29.5  MCHC 32.9 33.0 33.0  RDW 18.7* 19.1* 19.2*  PLT 151 157 123*   Thyroid  No results for input(s): "TSH", "FREET4" in the last 168 hours.  BNPNo results for input(s): "BNP", "PROBNP" in the last 168 hours.  DDimer No results for input(s): "DDIMER" in the last 168 hours.   Radiology    HYBRID OR IMAGING ([facility] ONLY) Result Date: 06/17/2023 There is no interpretation for this exam.  This order is for images obtained during a surgical procedure.  Please See "Surgeries" Tab for more information regarding the procedure.    Cardiac Studies   Echo   OCt 2024  1. Left ventricular ejection fraction, by estimation, is 40 to 45%. The  left ventricle has mildly decreased function. The left ventricle  demonstrates global hypokinesis. There is mild left ventricular  hypertrophy. Left ventricular diastolic parameters  are consistent with Grade II  diastolic dysfunction (pseudonormalization).  Elevated left atrial pressure.   2. Right ventricular systolic function is normal. The right ventricular  size is normal. Tricuspid regurgitation signal is inadequate for assessing  PA pressure.   3. Left atrial size was severely dilated.   4. Right atrial size was mildly dilated.   5. The mitral valve is abnormal. Mild mitral valve regurgitation. No  evidence of mitral stenosis.   6. The tricuspid valve is abnormal.   7. The aortic valve has an indeterminant number of cusps. There is  moderate calcification of the aortic valve. There is moderate thickening  of the aortic valve. Aortic valve regurgitation is not visualized.  Moderate aortic valve stenosis. Aortic valve  mean gradient measures 18.8 mmHg. Aortic valve peak gradient measures 44.4  mmHg. Aortic valve area, by VTI measures 1.10 cm. DI 0.29   8. The inferior vena cava is dilated in size with <50% respiratory  variability, suggesting right atrial pressure of 15 mmHg.   Patient Profile      Ronald [name] is a 83 y.o. male with a hx of CAD s/p CABG in 1999, AAA s/p repair in 2013, HFmrEF, HTN, HLD, ESRD on HD, History of CVA, PAD who is being seen 06/18/2023 for the evaluation of atrial fibrillation at the request of Jimmye Moulds MD.   Assessment & Plan    1  Atrial fibrillation  Pt developed afib with RVR during dialysis     Yesterday started on IV amio to help with rates   Today,  HR much improved  He is on asa and plavix    not on anticoagulation  Will need to start when safe from surgical perspective     2  Hx HFrEF   Last echo LVEF 40 to 45%   Repeat has been ordered  Overall volume status is fair   Renal following   3Hx CAD  Remote CABG in 1999  No CP   4  HL  On Lipitor 80  5  AAA   s/p endovascular repair yestreday    CT today  6  ESRD   Renal following   Pt had dialysis session yesterday urgently for hyperkalemia     For questions or updates, please  contact [facility] Please consult [email or web address] for contact info under  Signed, Ola Berger, MD  06/19/2023, 7:04 AM

## 2023-06-19 NOTE — Hospital Course (Signed)
 83 y.o. M with ESRD on HD, dementia, HTN, stroke, CAD s/p CABG remote and Afib not on AC due to dementia, fall risk who presented for elective AAA repair.   Underwent complex repair of 13(!) cm AAA on 5/7 by Dr. Fulton Job.    Post-op course complicated by hyperkalemia, acute anemia, delirium, and troponin elevation.   Hospitalist service consulted for assistance with medical management on 5/9.

## 2023-06-20 DIAGNOSIS — I10 Essential (primary) hypertension: Secondary | ICD-10-CM | POA: Diagnosis not present

## 2023-06-20 DIAGNOSIS — Z992 Dependence on renal dialysis: Secondary | ICD-10-CM

## 2023-06-20 DIAGNOSIS — I714 Abdominal aortic aneurysm, without rupture, unspecified: Secondary | ICD-10-CM | POA: Diagnosis not present

## 2023-06-20 DIAGNOSIS — N186 End stage renal disease: Secondary | ICD-10-CM

## 2023-06-20 DIAGNOSIS — I4891 Unspecified atrial fibrillation: Secondary | ICD-10-CM | POA: Diagnosis not present

## 2023-06-20 LAB — RENAL FUNCTION PANEL
Albumin: 2.9 g/dL — ABNORMAL LOW (ref 3.5–5.0)
Anion gap: 17 — ABNORMAL HIGH (ref 5–15)
BUN: 45 mg/dL — ABNORMAL HIGH (ref 8–23)
CO2: 21 mmol/L — ABNORMAL LOW (ref 22–32)
Calcium: 8.3 mg/dL — ABNORMAL LOW (ref 8.9–10.3)
Chloride: 94 mmol/L — ABNORMAL LOW (ref 98–111)
Creatinine, Ser: 7.86 mg/dL — ABNORMAL HIGH (ref 0.61–1.24)
GFR, Estimated: 6 mL/min — ABNORMAL LOW (ref >60)
Glucose, Bld: 108 mg/dL — ABNORMAL HIGH (ref 70–99)
Phosphorus: 8.1 mg/dL — ABNORMAL HIGH (ref 2.5–4.6)
Potassium: 4.3 mmol/L (ref 3.5–5.1)
Sodium: 132 mmol/L — ABNORMAL LOW (ref 135–145)

## 2023-06-20 LAB — TYPE AND SCREEN
ABO/RH(D): A POS
Antibody Screen: NEGATIVE
Unit division: 0
Unit division: 0
Unit division: 0
Unit division: 0

## 2023-06-20 LAB — CBC
HCT: 26.6 % — ABNORMAL LOW (ref 39.0–52.0)
Hemoglobin: 8.7 g/dL — ABNORMAL LOW (ref 13.0–17.0)
MCH: 28.2 pg (ref 26.0–34.0)
MCHC: 32.7 g/dL (ref 30.0–36.0)
MCV: 86.1 fL (ref 80.0–100.0)
Platelets: 108 10*3/uL — ABNORMAL LOW (ref 150–400)
RBC: 3.09 MIL/uL — ABNORMAL LOW (ref 4.22–5.81)
RDW: 18.7 % — ABNORMAL HIGH (ref 11.5–15.5)
WBC: 16.9 10*3/uL — ABNORMAL HIGH (ref 4.0–10.5)
nRBC: 0 % (ref 0.0–0.2)

## 2023-06-20 LAB — BPAM RBC
Blood Product Expiration Date: 202506062359
Blood Product Expiration Date: 202506062359
Blood Product Expiration Date: 202506072359
Blood Product Expiration Date: 202506072359
ISSUE DATE / TIME: 202505071252
ISSUE DATE / TIME: 202505071252
ISSUE DATE / TIME: 202505082224
ISSUE DATE / TIME: 202505090320
Unit Type and Rh: 6200
Unit Type and Rh: 6200
Unit Type and Rh: 6200
Unit Type and Rh: 6200

## 2023-06-20 MED ORDER — MELATONIN 3 MG PO TABS
3.0000 mg | ORAL_TABLET | Freq: Every day | ORAL | Status: DC
  Administered 2023-06-20 – 2023-06-22 (×3): 3 mg via ORAL
  Filled 2023-06-20 (×4): qty 1

## 2023-06-20 MED ORDER — DARBEPOETIN ALFA 60 MCG/0.3ML IJ SOSY
60.0000 ug | PREFILLED_SYRINGE | Freq: Once | INTRAMUSCULAR | Status: AC
  Administered 2023-06-20: 60 ug via SUBCUTANEOUS
  Filled 2023-06-20: qty 0.3

## 2023-06-20 NOTE — Progress Notes (Signed)
 [facility] CONSULT PROGRESS NOTE    Progress Note  Vastine Roebuck Hansmann  RUE:[id number] DOB: 1940/11/30 DOA: 06/17/2023 PCP: Omie Bickers, MD     Brief Narrative:   Jometh Manaois Margraf is an 83 y.o. male past medical history of end-stage renal disease on hemodialysis, dementia, essential hypertension, CAD with a history of CABG, chronic atrial fibrillation not on anticoagulation due to high risk of falls presented for an elective repair of her AAA, patient underwent a complex AAA repair on 06/17/2023, postop course was complicated by hyperkalemia acute anemia delirium and elevated troponins.  Assessment/Plan:   AAA (abdominal aortic aneurysm) (HCC) status post endovascular repair of aneurysm with stent, angioplasty, cutdown of the right common femoral artery and common femoral artery endarterectomy and angiogram by Dr. Fulton Job on 06/17/2023: Postop care per vascular surgery. Continue aspirin  and Plavix . Further management per vascular surgery, continue Lipitor.  Mild dementia/delirium: Has been intermittently confused and delirious. Open the windows  daily, close them at night, n to help minimize confusion.   Minimize disturbances specially at night, get his glasses and hearing aid from home. PT OT was consulted. Stop Ambien , avoid  benzodiazepines and narcotics. On mirtazapine . Started on melatonin at night. If he becomes too agitated use Haldol .  Acute blood loss anemia in the setting of acute retroperitoneal hematoma:  Hemoglobin drop from 9.6-7.6 on 06/19/2023 he was transfused 1 unit of packed red blood cells. CT angio of the chest abdomen and pelvis show 8 x 2 cm retroperitoneal hematoma in the right.  No active extravasation. Transfusion threshold is 8. Defer aspirin  and Plavix  to vascular surgery. CBC is pending this morning.  End-stage renal disease on hemodialysis/hyperkalemia/hyperphosphatemia: Renal on board further management per renal.  Coronary artery  disease/elevated troponins: Denies any chest pain but new ST segment depressions in twelve-lead EKG. Cardiology was consulted, further management per cardiology  Essential hypertension/Chronic congestive heart failure (HCC) Holding amlodipine  and Coreg , last 2D echo showed an EF of 40% with moderate aortic stenosis, grade 2 diastolic dysfunction. Appears euvolemic.  Paroxysmal atrial fibrillation: Not on anticoagulation due to his significant history of falls. Rapid was consulted yesterday due to RVR. Cardiology was consulted started on amiodarone  infusion and heart rate has remained less than 95. Further management per cardiology  BPH: Continue Flomax .  Mild hyponatremia: Remains stable 133-132 further management per renal  DVT prophylaxis: heparin  Family Communication:none Status is: Inpatient Remains inpatient appropriate because: AAA repair    Code Status:     Code Status Orders  (From admission, onward)           Start     Ordered   06/17/23 1646  Full code  Continuous       Question:  By:  Answer:  Other   06/17/23 1645           Code Status History     Date Active Date Inactive Code Status Order ID Comments User Context   11/13/2022 0759 11/17/2022 1926 Do not attempt resuscitation (DNR) PRE-ARREST INTERVENTIONS DESIRED [id number]  Demaris Fillers, MD Inpatient   11/12/2022 1841 11/13/2022 0759 Full Code [id number]  Pati Bonine, MD ED         IV Access:   Peripheral IV   Procedures and diagnostic studies:   CT Angio Chest/Abd/Pel for Dissection W and/or W/WO Result Date: 06/19/2023 CLINICAL DATA:  Aortic aneurysm EXAM: CT ANGIOGRAPHY CHEST, ABDOMEN AND PELVIS TECHNIQUE: Non-contrast CT of the chest was initially obtained. Multidetector CT imaging through the chest, abdomen  and pelvis was performed using the standard protocol during bolus administration of intravenous contrast. Multiplanar reconstructed images and MIPs were obtained and reviewed  to evaluate the vascular anatomy. RADIATION DOSE REDUCTION: This exam was performed according to the departmental dose-optimization program which includes automated exposure control, adjustment of the mA and/or kV according to patient size and/or use of iterative reconstruction technique. CONTRAST:  OMNIPAQUE  IOHEXOL  350 MG/ML SOLN COMPARISON:  None Available. FINDINGS: CTA CHEST FINDINGS Cardiovascular: Calcific atherosclerosis is present in the native coronary arteries. Aortic valve calcifications. The tubular ascending thoracic aorta is within normal limits measuring approximately 37 mm. Four vessel aortic arch with proximal origin of the left vertebral artery. Atherosclerotic changes are present in the descending thoracic aorta. Early venous filling is present in the left upper extremity, possibly secondary to vascular access. Enlarged heart. Mediastinum/Nodes: Postsurgical changes from coronary artery bypass grafting. Lungs/Pleura: Mild interstitial prominence. Trace bilateral pleural effusions. Patchy consolidation is present in the lower lobes, slightly worse on the right. Musculoskeletal: Degenerative changes are present in the imaged osseous structures. Review of the MIP images confirms the above findings. CTA ABDOMEN AND PELVIS FINDINGS VASCULAR Aorta: Post treatment changes from placement of an aortic endograft revision in the infrarenal abdominal aorta. Infrarenal abdominal aortic aneurysm measures 13.2 x 11.1 cm, previously 12.8 x 11 cm. There is also been extension into the right external iliac artery The soft tissue adjacent to the proximal aspect of the abdominal aortic aneurysm sac is morphologically abnormal and described in more detail below. Celiac: Mild atherosclerotic changes in the proximal segment. SMA: Mild atherosclerotic changes in the proximal segment. Renals: The left renal artery is small and demonstrates scant opacification. The right renal artery is not opacified. IMA: The IMA  does not enhance in the very proximal origin. The mid segment reconstitutes. Inflow: There is been interval extension of the right iliac stent material into the right external iliac artery. The left limb of the endograft extends into the left common iliac artery. Both stent graft limbs are patent. Veins: There is displacement of the IVC by the infrarenal abdominal aortic aneurysm. Review of the MIP images confirms the above findings. NON-VASCULAR Hepatobiliary: Contrast agent is present within the gallbladder. Pancreas: Nothing significant. Spleen: Normal in size without focal abnormality. Adrenals/Urinary Tract: Renal cortical atrophy with cystic changes noted bilaterally. Overall, the appearance of both kidneys is not significantly changed. Urinary bladder is decompressed. Stomach/Bowel: No dilated loops of bowel are appreciated. A moderate volume of stool material is present. Bowel containing left inguinal hernia. Lymphatic: The area adjacent to the proximal aspect of the aneurysm sac is abnormal in appearance. It is possible, that this area represents superimposed lymphadenopathy. A representative lesion is present which estimated at 2.5 cm on image 177 of series 6. Additional possible nodal tissue is present along the left lateral aspect of the aortic aneurysm which is annotated. Reproductive: Post treatment changes are present in the prostate. Other: There is stranding in the right retroperitoneum which is dense and favored to represent a retroperitoneal hematoma. On axial imaging, this measures approximately 8.2 x 2.3 cm. There is significant craniocaudal extent. Postsurgical changes are also present at the right femoral access site. Musculoskeletal: No acute or significant osseous findings. Review of the MIP images confirms the above findings. IMPRESSION: 1. Postsurgical changes from revision aortic endograft. There is no overt evidence of endoleak. A retroperitoneal hematoma is present which is detailed  above. The right renal artery is not opacified. 2. Aortic aneurysm measures 13.2 x  11.1 cm, previously 12.8 x 11 cm. 3. The cranial aspect of the abdominal aneurysm sac is abnormal in appearance. Differential considerations include morphologically irregular aneurysm sac versus retroperitoneal lymphadenopathy. Consideration can be given toward outpatient evaluation by PET-CT for problem solving. 4. Small foci of bibasilar airspace consolidation with trace right pleural effusion. Electronically Signed   By: Reagan Camera M.D.   On: 06/19/2023 10:30     Medical Consultants:   None.   Subjective:    Tien Khoshaba Solana no complaints this morning calm mittens in place he wants to be removed  Objective:    Vitals:   06/19/23 2311 06/20/23 0326 06/20/23 0732 06/20/23 0736  BP: (!) 141/69 139/66 126/66 126/66  Pulse: 82  (!) 109 84  Resp: 18 (!) 25 19 18   Temp: 98.5 F (36.9 C) 98.5 F (36.9 C) 98.3 F (36.8 C) 98.3 F (36.8 C)  TempSrc: Oral Oral Oral Oral  SpO2: 90% 95% (!) 82% 93%  Weight:      Height:       SpO2: 93 %   Intake/Output Summary (Last 24 hours) at 06/20/2023 0912 Last data filed at 06/20/2023 0427 Gross per 24 hour  Intake 683.96 ml  Output --  Net 683.96 ml   Filed Weights   06/17/23 0641 06/18/23 0854 06/18/23 1309  Weight: 94.9 kg 93.4 kg 94 kg    Exam: General exam: In no acute distress. Respiratory system: Good air movement and clear to auscultation. Cardiovascular system: S1 & S2 heard, RRR. No JVD. Gastrointestinal system: Abdomen is nondistended, soft and nontender.  Extremities: No pedal edema. Skin: No rashes, lesions or ulcers Psychiatry: No judgment or insight of medical condition   Data Reviewed:    Labs: Basic Metabolic Panel: Recent Labs  Lab 06/17/23 1532 06/17/23 1839 06/18/23 0331 06/18/23 0333 06/18/23 1832 06/19/23 1007 06/20/23 0358  NA 134* 133* 135 133* 134* 133* 132*  K 5.1 5.4* 6.6* 6.6* 4.1 4.8 4.3  CL 102 103  104 103 95* 95* 94*  CO2 18* 18* 20* 19* 24 22 21*  GLUCOSE 241* 156* 144* 142* 112* 111* 108*  BUN 36* 39* 42* 42* 25* 35* 45*  CREATININE 6.90* 7.14* 7.58* 7.65* 5.01* 6.38* 7.86*  CALCIUM  7.4* 7.5* 8.0* 8.0* 8.0* 8.1* 8.3*  MG  --  1.8  --   --   --   --   --   PHOS 6.1*  --   --  7.3*  --  7.8* 8.1*   GFR Estimated Creatinine Clearance: 8.6 mL/min (A) (by C-G formula based on SCr of 7.86 mg/dL (H)). Liver Function Tests: Recent Labs  Lab 06/17/23 1532 06/18/23 0333 06/19/23 1007 06/20/23 0358  ALBUMIN  2.6* 3.2* 2.9* 2.9*   No results for input(s): "LIPASE", "AMYLASE" in the last 168 hours. No results for input(s): "AMMONIA" in the last 168 hours. Coagulation profile Recent Labs  Lab 06/17/23 1839  INR 1.4*   COVID-19 Labs  No results for input(s): "DDIMER", "FERRITIN", "LDH", "CRP" in the last 72 hours.  Lab Results  Component Value Date   SARSCOV2NAA Not Detected 07/30/2020    CBC: Recent Labs  Lab 06/17/23 1421 06/17/23 1839 06/18/23 0331 06/18/23 1832 06/19/23 1007  WBC  --  13.2* 14.4* 11.0* 12.9*  HGB 7.8* 9.6* 9.5* 7.4* 9.4*  HCT 23.0* 29.2* 28.8* 22.4* 29.1*  MCV  --  88.0 88.1 89.2 87.9  PLT  --  151 157 123* 118*   Cardiac Enzymes: No results for input(s): "  CKTOTAL", "CKMB", "CKMBINDEX", "TROPONINI" in the last 168 hours. BNP (last 3 results) No results for input(s): "PROBNP" in the last 8760 hours. CBG: Recent Labs  Lab 06/19/23 1933  GLUCAP 113*   D-Dimer: No results for input(s): "DDIMER" in the last 72 hours. Hgb A1c: No results for input(s): "HGBA1C" in the last 72 hours. Lipid Profile: No results for input(s): "CHOL", "HDL", "LDLCALC", "TRIG", "CHOLHDL", "LDLDIRECT" in the last 72 hours. Thyroid  function studies: Recent Labs    06/19/23 1007  TSH 3.984   Anemia work up: No results for input(s): "VITAMINB12", "FOLATE", "FERRITIN", "TIBC", "IRON", "RETICCTPCT" in the last 72 hours. Sepsis Labs: Recent Labs  Lab  06/17/23 1839 06/18/23 0331 06/18/23 1832 06/19/23 1007  WBC 13.2* 14.4* 11.0* 12.9*   Microbiology Recent Results (from the past 240 hours)  Surgical pcr screen     Status: Abnormal   Collection Time: 06/10/23 10:08 AM   Specimen: Nasal Mucosa; Nasal Swab  Result Value Ref Range Status   MRSA, PCR NEGATIVE NEGATIVE Final   Staphylococcus aureus POSITIVE (A) NEGATIVE Final    Comment: (NOTE) The Xpert SA Assay (FDA approved for NASAL specimens in patients 54 years of age and older), is one component of a comprehensive surveillance program. It is not intended to diagnose infection nor to guide or monitor treatment. Performed at [facility], [address] [place], [place] [address]      Medications:    sodium chloride    Intravenous Once   aspirin  EC  81 mg Oral Daily   atorvastatin   80 mg Oral q AM   calcium  acetate  1,334 mg Oral TID WC   Chlorhexidine  Gluconate Cloth  6 each Topical Q0600   Chlorhexidine  Gluconate Cloth  6 each Topical Q0600   clopidogrel   75 mg Oral Daily   docusate sodium   100 mg Oral Daily   heparin   5,000 Units Subcutaneous Q8H   mirtazapine   15 mg Oral QHS   pantoprazole   40 mg Oral q AM   sodium chloride  flush  3 mL Intravenous Q12H   tamsulosin   0.4 mg Oral QPC supper   Continuous Infusions:  sodium chloride      amiodarone  30 mg/hr (06/20/23 0427)   magnesium  sulfate bolus IVPB        LOS: 3 days   Macdonald Savoy  [facility]  06/20/2023, 9:12 AM

## 2023-06-20 NOTE — Progress Notes (Addendum)
  Progress Note    06/20/2023 6:52 AM 3 Days Post-Op  Subjective:  pt required a sitter last evening and he said pt was trying to get to IV pole and required soft mittens.  When I was asking pt questions, he said he was angry bc I was asking him questions.   Afebrile HR 80's NSR/afib 130's-140's systolic 95% RA  Vitals:   06/19/23 2311 06/20/23 0326  BP: (!) 141/69 139/66  Pulse: 82   Resp: 18 (!) 25  Temp: 98.5 F (36.9 C) 98.5 F (36.9 C)  SpO2: 90% 95%    Physical Exam: General:  no distress Cardiac:  regular-NSR on monitor Lungs:  non labored Incisions:  right groin incision looks good with some ecchymosis.  Soft.  Left groin also soft. Extremities:  brisk PT doppler flow bilaterally Abdomen:  still with tenderness to palpation RLQ, otherwise, soft and non tender.   CBC    Component Value Date/Time   WBC 12.9 (H) 06/19/2023 1007   RBC 3.31 (L) 06/19/2023 1007   HGB 9.4 (L) 06/19/2023 1007   HCT 29.1 (L) 06/19/2023 1007   PLT 118 (L) 06/19/2023 1007   MCV 87.9 06/19/2023 1007   MCH 28.4 06/19/2023 1007   MCHC 32.3 06/19/2023 1007   RDW 18.8 (H) 06/19/2023 1007   LYMPHSABS 1.3 09/12/2015 1015   MONOABS 0.6 09/12/2015 1015   EOSABS 0.2 09/12/2015 1015   BASOSABS 0.0 09/12/2015 1015    BMET    Component Value Date/Time   NA 132 (L) 06/20/2023 0358   K 4.3 06/20/2023 0358   CL 94 (L) 06/20/2023 0358   CO2 21 (L) 06/20/2023 0358   GLUCOSE 108 (H) 06/20/2023 0358   BUN 45 (H) 06/20/2023 0358   CREATININE 7.86 (H) 06/20/2023 0358   CALCIUM  8.3 (L) 06/20/2023 0358   GFRNONAA 6 (L) 06/20/2023 0358   GFRAA 15 (L) 08/10/2019 1109    INR    Component Value Date/Time   INR 1.4 (H) 06/17/2023 1839     Intake/Output Summary (Last 24 hours) at 06/20/2023 4098 Last data filed at 06/20/2023 0427 Gross per 24 hour  Intake 683.96 ml  Output --  Net 683.96 ml      Assessment/Plan:  83 y.o. male is s/p:  EVAR with abdominal stent graft using a physician  modified endoprosthesis (Gore 45 mm x 10 cm thoracic endograft with back table modification creating scallop for SMA), angioplasty and stent of right external iliac artery, cutdown on the right common femoral artery for open common femoral exposure, right common femoral endarterectomy including endarterectomy of the proximal SFA and profunda, right lower extremity angiogram, endarterectomy of the proximal SFA with bovine patch angioplasty for 13cm AAA by Dr. Fulton Job   3 Days Post-Op   -pt with brisk PT doppler flow bilaterally -CTA yesterday revealed RP bleed but no active extravasation.  CBC pending.  -confusion overnight and required soft mittens and a sitter.  -DVT prophylaxis:  SCD's .  Pt with RP bleed.  Will need to start when able due to afib.   -continue asa/statin/plavix    Maryanna Smart, PA-C [facility] [phone] 06/20/2023 6:52 AM    I have interviewed and examined patient with PA and agree with assessment and plan above.   Kenneshia Rehm C. Vikki Graves, MD [facility] [place] Office: [phone] Pager: [phone]

## 2023-06-20 NOTE — Plan of Care (Signed)
   Problem: Activity: Goal: Risk for activity intolerance will decrease Outcome: Progressing   Problem: Coping: Goal: Level of anxiety will decrease Outcome: Progressing

## 2023-06-20 NOTE — Progress Notes (Signed)
 Progress Note  Patient Name: Ronald [name] Date of Encounter: 06/20/2023  Primary Cardiologist: Janelle Mediate, MD   Subjective   Denies chest pain. Dyspnea improving.   Inpatient Medications    Scheduled Meds:  sodium chloride    Intravenous Once   aspirin  EC  81 mg Oral Daily   atorvastatin   80 mg Oral q AM   calcium  acetate  1,334 mg Oral TID WC   Chlorhexidine  Gluconate Cloth  6 each Topical Q0600   Chlorhexidine  Gluconate Cloth  6 each Topical Q0600   clopidogrel   75 mg Oral Daily   docusate sodium   100 mg Oral Daily   heparin   5,000 Units Subcutaneous Q8H   melatonin  3 mg Oral QHS   mirtazapine   15 mg Oral QHS   pantoprazole   40 mg Oral q AM   sodium chloride  flush  3 mL Intravenous Q12H   tamsulosin   0.4 mg Oral QPC supper   Continuous Infusions:  sodium chloride      amiodarone  30 mg/hr (06/20/23 0427)   magnesium  sulfate bolus IVPB     PRN Meds: sodium chloride , acetaminophen  **OR** acetaminophen , bisacodyl , guaiFENesin -dextromethorphan, HYDROmorphone  (DILAUDID ) injection, magnesium  sulfate bolus IVPB, metoprolol  tartrate, ondansetron , oxyCODONE , phenol, senna-docusate, sodium chloride  flush   Vital Signs    Vitals:   06/19/23 2311 06/20/23 0326 06/20/23 0732 06/20/23 0736  BP: (!) 141/69 139/66 126/66 126/66  Pulse: 82  (!) 109 84  Resp: 18 (!) 25 19 18   Temp: 98.5 F (36.9 C) 98.5 F (36.9 C) 98.3 F (36.8 C) 98.3 F (36.8 C)  TempSrc: Oral Oral Oral Oral  SpO2: 90% 95% (!) 82% 93%  Weight:      Height:        Intake/Output Summary (Last 24 hours) at 06/20/2023 1011 Last data filed at 06/20/2023 0427 Gross per 24 hour  Intake 683.96 ml  Output --  Net 683.96 ml   Filed Weights   06/17/23 0641 06/18/23 0854 06/18/23 1309  Weight: 94.9 kg 93.4 kg 94 kg    Telemetry    NSR- Personally Reviewed  ECG    none - Personally Reviewed  Physical Exam   GEN: No acute distress.   Neck: No JVD Cardiac: IRRR, no murmurs, rubs, or  gallops.  Respiratory: Clear to auscultation bilaterally. GI: Soft, nontender, non-distended  MS: No edema; No deformity. Neuro:  Nonfocal  Psych: Normal affect   Labs    Chemistry Recent Labs  Lab 06/18/23 0333 06/18/23 1832 06/19/23 1007 06/20/23 0358  NA 133* 134* 133* 132*  K 6.6* 4.1 4.8 4.3  CL 103 95* 95* 94*  CO2 19* 24 22 21*  GLUCOSE 142* 112* 111* 108*  BUN 42* 25* 35* 45*  CREATININE 7.65* 5.01* 6.38* 7.86*  CALCIUM  8.0* 8.0* 8.1* 8.3*  ALBUMIN  3.2*  --  2.9* 2.9*  GFRNONAA 7* 11* 8* 6*  ANIONGAP 11 15 16* 17*     Hematology Recent Labs  Lab 06/18/23 0331 06/18/23 1832 06/19/23 1007  WBC 14.4* 11.0* 12.9*  RBC 3.27* 2.51* 3.31*  HGB 9.5* 7.4* 9.4*  HCT 28.8* 22.4* 29.1*  MCV 88.1 89.2 87.9  MCH 29.1 29.5 28.4  MCHC 33.0 33.0 32.3  RDW 19.1* 19.2* 18.8*  PLT 157 123* 118*    Cardiac EnzymesNo results for input(s): "TROPONINI" in the last 168 hours. No results for input(s): "TROPIPOC" in the last 168 hours.   BNPNo results for input(s): "BNP", "PROBNP" in the last 168 hours.   DDimer No results  for input(s): "DDIMER" in the last 168 hours.   Radiology    CT Angio Chest/Abd/Pel for Dissection W and/or W/WO Result Date: 06/19/2023 CLINICAL DATA:  Aortic aneurysm EXAM: CT ANGIOGRAPHY CHEST, ABDOMEN AND PELVIS TECHNIQUE: Non-contrast CT of the chest was initially obtained. Multidetector CT imaging through the chest, abdomen and pelvis was performed using the standard protocol during bolus administration of intravenous contrast. Multiplanar reconstructed images and MIPs were obtained and reviewed to evaluate the vascular anatomy. RADIATION DOSE REDUCTION: This exam was performed according to the departmental dose-optimization program which includes automated exposure control, adjustment of the mA and/or kV according to patient size and/or use of iterative reconstruction technique. CONTRAST:  OMNIPAQUE  IOHEXOL  350 MG/ML SOLN COMPARISON:  None Available.  FINDINGS: CTA CHEST FINDINGS Cardiovascular: Calcific atherosclerosis is present in the native coronary arteries. Aortic valve calcifications. The tubular ascending thoracic aorta is within normal limits measuring approximately 37 mm. Four vessel aortic arch with proximal origin of the left vertebral artery. Atherosclerotic changes are present in the descending thoracic aorta. Early venous filling is present in the left upper extremity, possibly secondary to vascular access. Enlarged heart. Mediastinum/Nodes: Postsurgical changes from coronary artery bypass grafting. Lungs/Pleura: Mild interstitial prominence. Trace bilateral pleural effusions. Patchy consolidation is present in the lower lobes, slightly worse on the right. Musculoskeletal: Degenerative changes are present in the imaged osseous structures. Review of the MIP images confirms the above findings. CTA ABDOMEN AND PELVIS FINDINGS VASCULAR Aorta: Post treatment changes from placement of an aortic endograft revision in the infrarenal abdominal aorta. Infrarenal abdominal aortic aneurysm measures 13.2 x 11.1 cm, previously 12.8 x 11 cm. There is also been extension into the right external iliac artery The soft tissue adjacent to the proximal aspect of the abdominal aortic aneurysm sac is morphologically abnormal and described in more detail below. Celiac: Mild atherosclerotic changes in the proximal segment. SMA: Mild atherosclerotic changes in the proximal segment. Renals: The left renal artery is small and demonstrates scant opacification. The right renal artery is not opacified. IMA: The IMA does not enhance in the very proximal origin. The mid segment reconstitutes. Inflow: There is been interval extension of the right iliac stent material into the right external iliac artery. The left limb of the endograft extends into the left common iliac artery. Both stent graft limbs are patent. Veins: There is displacement of the IVC by the infrarenal abdominal  aortic aneurysm. Review of the MIP images confirms the above findings. NON-VASCULAR Hepatobiliary: Contrast agent is present within the gallbladder. Pancreas: Nothing significant. Spleen: Normal in size without focal abnormality. Adrenals/Urinary Tract: Renal cortical atrophy with cystic changes noted bilaterally. Overall, the appearance of both kidneys is not significantly changed. Urinary bladder is decompressed. Stomach/Bowel: No dilated loops of bowel are appreciated. A moderate volume of stool material is present. Bowel containing left inguinal hernia. Lymphatic: The area adjacent to the proximal aspect of the aneurysm sac is abnormal in appearance. It is possible, that this area represents superimposed lymphadenopathy. A representative lesion is present which estimated at 2.5 cm on image 177 of series 6. Additional possible nodal tissue is present along the left lateral aspect of the aortic aneurysm which is annotated. Reproductive: Post treatment changes are present in the prostate. Other: There is stranding in the right retroperitoneum which is dense and favored to represent a retroperitoneal hematoma. On axial imaging, this measures approximately 8.2 x 2.3 cm. There is significant craniocaudal extent. Postsurgical changes are also present at the right femoral access site. Musculoskeletal:  No acute or significant osseous findings. Review of the MIP images confirms the above findings. IMPRESSION: 1. Postsurgical changes from revision aortic endograft. There is no overt evidence of endoleak. A retroperitoneal hematoma is present which is detailed above. The right renal artery is not opacified. 2. Aortic aneurysm measures 13.2 x 11.1 cm, previously 12.8 x 11 cm. 3. The cranial aspect of the abdominal aneurysm sac is abnormal in appearance. Differential considerations include morphologically irregular aneurysm sac versus retroperitoneal lymphadenopathy. Consideration can be given toward outpatient evaluation by  PET-CT for problem solving. 4. Small foci of bibasilar airspace consolidation with trace right pleural effusion. Electronically Signed   By: Reagan Camera M.D.   On: 06/19/2023 10:30    Cardiac Studies   See above  Patient Profile     83 y.o. male admitted with AAA leak, and afib, cad, and LV dysfunction and ESRD on HD.  Assessment & Plan    Afib with a RVR - his rates are much better controlled. Looks to be back in NSR today. Could transition to oral amio tomorrow. HFREF - he appears fairly euvolemic today. Continue HD.  Hyperkalemia - potassium much improved.      For questions or updates, please contact [facility] Please consult [email or web address] for contact info under Cardiology/STEMI.      Signed, Manya Sells, MD  06/20/2023, 10:11 AM

## 2023-06-20 NOTE — Progress Notes (Signed)
  [name] Progress Note   Subjective: Sitter in room. In hand restraints. Calm and cooperative on my exam. Denies cp, sob. No c/os   Objective Vitals:   06/19/23 2311 06/20/23 0326 06/20/23 0732 06/20/23 0736  BP: (!) 141/69 139/66 126/66 126/66  Pulse: 82  (!) 109 84  Resp: 18 (!) 25 19 18   Temp: 98.5 F (36.9 C) 98.5 F (36.9 C) 98.3 F (36.8 C) 98.3 F (36.8 C)  TempSrc: Oral Oral Oral Oral  SpO2: 90% 95% (!) 82% 93%  Weight:      Height:          Additional Objective Labs: Basic Metabolic Panel: Recent Labs  Lab 06/18/23 0333 06/18/23 1832 06/19/23 1007 06/20/23 0358  NA 133* 134* 133* 132*  K 6.6* 4.1 4.8 4.3  CL 103 95* 95* 94*  CO2 19* 24 22 21*  GLUCOSE 142* 112* 111* 108*  BUN 42* 25* 35* 45*  CREATININE 7.65* 5.01* 6.38* 7.86*  CALCIUM  8.0* 8.0* 8.1* 8.3*  PHOS 7.3*  --  7.8* 8.1*   CBC: Recent Labs  Lab 06/17/23 1839 06/18/23 0331 06/18/23 1832 06/19/23 1007  WBC 13.2* 14.4* 11.0* 12.9*  HGB 9.6* 9.5* 7.4* 9.4*  HCT 29.2* 28.8* 22.4* 29.1*  MCV 88.0 88.1 89.2 87.9  PLT 151 157 123* 118*   Blood Culture No results found for: "SDES", "SPECREQUEST", "CULT", "REPTSTATUS"   Physical Exam General: Alert, lying in bed, nad Heart: RRR Lungs: Clear bilaterally  Abdomen: soft non-tender Extremities: No LE edema  Dialysis Access: L AVF +bruit   Medications:  sodium chloride      amiodarone  30 mg/hr (06/20/23 0427)   magnesium  sulfate bolus IVPB      sodium chloride    Intravenous Once   aspirin  EC  81 mg Oral Daily   atorvastatin   80 mg Oral q AM   calcium  acetate  1,334 mg Oral TID WC   Chlorhexidine  Gluconate Cloth  6 each Topical Q0600   Chlorhexidine  Gluconate Cloth  6 each Topical Q0600   clopidogrel   75 mg Oral Daily   docusate sodium   100 mg Oral Daily   heparin   5,000 Units Subcutaneous Q8H   mirtazapine   15 mg Oral QHS   pantoprazole   40 mg Oral q AM   sodium chloride  flush  3 mL Intravenous Q12H    tamsulosin   0.4 mg Oral QPC supper    Assessment/Plan: 83 y.o. male with ESRD on HD, CAD s/p CABG. HTN, HFrEF, GERD, PAD currently admitted after planned EVAR  by Dr. Fulton Job and nephrology is consulted for comanagement of ESRD and assoc conditions.   AAA s/p EVAR 5/8 per [facility].  Retroperitoneal hematoma. Found on CT . Follow H/H.  ESRD - Biweekly HD M/F at [facility]. Had HD x2 this week. No urgent indication for dialysis today. Plan next HD Mon 5/12. No heparin  with HD.  Anemia of ESRD + ABLA. Serial H/H. Transfusion threshold < 8. Will order ESA here. S/p prbcs on 5/9.  HTN/Volume - BP stable. Euvolemic.  MBD.   Ca acceptable. Phos above goal. Continue home binder.  Afib w RVR - Cardiology consulted. IV amiodarone  started.  Delirium/confusion - requiring sitter, mittens    Ronald [name]  [facility] 06/20/2023,8:52 AM

## 2023-06-21 DIAGNOSIS — I4819 Other persistent atrial fibrillation: Secondary | ICD-10-CM | POA: Diagnosis not present

## 2023-06-21 DIAGNOSIS — N186 End stage renal disease: Secondary | ICD-10-CM | POA: Diagnosis not present

## 2023-06-21 DIAGNOSIS — I714 Abdominal aortic aneurysm, without rupture, unspecified: Secondary | ICD-10-CM | POA: Diagnosis not present

## 2023-06-21 DIAGNOSIS — Z992 Dependence on renal dialysis: Secondary | ICD-10-CM | POA: Diagnosis not present

## 2023-06-21 DIAGNOSIS — I10 Essential (primary) hypertension: Secondary | ICD-10-CM | POA: Diagnosis not present

## 2023-06-21 LAB — RENAL FUNCTION PANEL
Albumin: 2.5 g/dL — ABNORMAL LOW (ref 3.5–5.0)
Anion gap: 18 — ABNORMAL HIGH (ref 5–15)
BUN: 61 mg/dL — ABNORMAL HIGH (ref 8–23)
CO2: 20 mmol/L — ABNORMAL LOW (ref 22–32)
Calcium: 8.3 mg/dL — ABNORMAL LOW (ref 8.9–10.3)
Chloride: 93 mmol/L — ABNORMAL LOW (ref 98–111)
Creatinine, Ser: 9.71 mg/dL — ABNORMAL HIGH (ref 0.61–1.24)
GFR, Estimated: 5 mL/min — ABNORMAL LOW (ref >60)
Glucose, Bld: 104 mg/dL — ABNORMAL HIGH (ref 70–99)
Phosphorus: 8.1 mg/dL — ABNORMAL HIGH (ref 2.5–4.6)
Potassium: 4.3 mmol/L (ref 3.5–5.1)
Sodium: 131 mmol/L — ABNORMAL LOW (ref 135–145)

## 2023-06-21 MED ORDER — AMIODARONE HCL 200 MG PO TABS
200.0000 mg | ORAL_TABLET | Freq: Two times a day (BID) | ORAL | Status: DC
  Administered 2023-06-21 (×2): 200 mg via ORAL
  Filled 2023-06-21 (×2): qty 1

## 2023-06-21 MED ORDER — FUROSEMIDE 10 MG/ML IJ SOLN: 40.0000 mg | Freq: Once | INTRAMUSCULAR | Status: DC

## 2023-06-21 NOTE — Progress Notes (Addendum)
 Progress Note  Patient Name: Ronald [name] Date of Encounter: 06/21/2023  Primary Cardiologist: Janelle Mediate, MD   Subjective   Mentation improved. C/o pain in lower abd and flank.  Inpatient Medications    Scheduled Meds:  sodium chloride    Intravenous Once   aspirin  EC  81 mg Oral Daily   atorvastatin   80 mg Oral q AM   calcium  acetate  1,334 mg Oral TID WC   Chlorhexidine  Gluconate Cloth  6 each Topical Q0600   Chlorhexidine  Gluconate Cloth  6 each Topical Q0600   clopidogrel   75 mg Oral Daily   docusate sodium   100 mg Oral Daily   heparin   5,000 Units Subcutaneous Q8H   melatonin  3 mg Oral QHS   mirtazapine   15 mg Oral QHS   pantoprazole   40 mg Oral q AM   sodium chloride  flush  3 mL Intravenous Q12H   tamsulosin   0.4 mg Oral QPC supper   Continuous Infusions:  sodium chloride      amiodarone  30 mg/hr (06/21/23 0038)   magnesium  sulfate bolus IVPB     PRN Meds: sodium chloride , acetaminophen  **OR** acetaminophen , bisacodyl , guaiFENesin -dextromethorphan, HYDROmorphone  (DILAUDID ) injection, magnesium  sulfate bolus IVPB, metoprolol  tartrate, ondansetron , oxyCODONE , phenol, senna-docusate, sodium chloride  flush   Vital Signs    Vitals:   06/20/23 2310 06/21/23 0413 06/21/23 0458 06/21/23 0900  BP: 123/61 (!) 140/66 (!) 140/66   Pulse: 75  79   Resp: 16 20 20 16   Temp:  98.4 F (36.9 C) 99 F (37.2 C)   TempSrc: Axillary Oral Oral   SpO2: 96% 95%    Weight:  98.2 kg    Height:        Intake/Output Summary (Last 24 hours) at 06/21/2023 0909 Last data filed at 06/21/2023 0800 Gross per 24 hour  Intake 1131.61 ml  Output --  Net 1131.61 ml   Filed Weights   06/18/23 0854 06/18/23 1309 06/21/23 0413  Weight: 93.4 kg 94 kg 98.2 kg    Telemetry    Probable NSR - Personally Reviewed  ECG    none - Personally Reviewed  Physical Exam   GEN: No acute distress.   Neck: No JVD Cardiac: RRR, no murmurs, rubs, or gallops.  Respiratory: Clear to  auscultation bilaterally. GI: Soft, nontender, non-distended  MS: No edema; No deformity. Neuro:  Nonfocal  Psych: Normal affect   Labs    Chemistry Recent Labs  Lab 06/18/23 0333 06/18/23 1832 06/19/23 1007 06/20/23 0358  NA 133* 134* 133* 132*  K 6.6* 4.1 4.8 4.3  CL 103 95* 95* 94*  CO2 19* 24 22 21*  GLUCOSE 142* 112* 111* 108*  BUN 42* 25* 35* 45*  CREATININE 7.65* 5.01* 6.38* 7.86*  CALCIUM  8.0* 8.0* 8.1* 8.3*  ALBUMIN  3.2*  --  2.9* 2.9*  GFRNONAA 7* 11* 8* 6*  ANIONGAP 11 15 16* 17*     Hematology Recent Labs  Lab 06/18/23 1832 06/19/23 1007 06/20/23 0941  WBC 11.0* 12.9* 16.9*  RBC 2.51* 3.31* 3.09*  HGB 7.4* 9.4* 8.7*  HCT 22.4* 29.1* 26.6*  MCV 89.2 87.9 86.1  MCH 29.5 28.4 28.2  MCHC 33.0 32.3 32.7  RDW 19.2* 18.8* 18.7*  PLT 123* 118* 108*    Cardiac EnzymesNo results for input(s): "TROPONINI" in the last 168 hours. No results for input(s): "TROPIPOC" in the last 168 hours.   BNPNo results for input(s): "BNP", "PROBNP" in the last 168 hours.   DDimer No results for input(s): "  DDIMER" in the last 168 hours.   Radiology    CT Angio Chest/Abd/Pel for Dissection W and/or W/WO Result Date: 06/19/2023 CLINICAL DATA:  Aortic aneurysm EXAM: CT ANGIOGRAPHY CHEST, ABDOMEN AND PELVIS TECHNIQUE: Non-contrast CT of the chest was initially obtained. Multidetector CT imaging through the chest, abdomen and pelvis was performed using the standard protocol during bolus administration of intravenous contrast. Multiplanar reconstructed images and MIPs were obtained and reviewed to evaluate the vascular anatomy. RADIATION DOSE REDUCTION: This exam was performed according to the departmental dose-optimization program which includes automated exposure control, adjustment of the mA and/or kV according to patient size and/or use of iterative reconstruction technique. CONTRAST:  OMNIPAQUE  IOHEXOL  350 MG/ML SOLN COMPARISON:  None Available. FINDINGS: CTA CHEST FINDINGS  Cardiovascular: Calcific atherosclerosis is present in the native coronary arteries. Aortic valve calcifications. The tubular ascending thoracic aorta is within normal limits measuring approximately 37 mm. Four vessel aortic arch with proximal origin of the left vertebral artery. Atherosclerotic changes are present in the descending thoracic aorta. Early venous filling is present in the left upper extremity, possibly secondary to vascular access. Enlarged heart. Mediastinum/Nodes: Postsurgical changes from coronary artery bypass grafting. Lungs/Pleura: Mild interstitial prominence. Trace bilateral pleural effusions. Patchy consolidation is present in the lower lobes, slightly worse on the right. Musculoskeletal: Degenerative changes are present in the imaged osseous structures. Review of the MIP images confirms the above findings. CTA ABDOMEN AND PELVIS FINDINGS VASCULAR Aorta: Post treatment changes from placement of an aortic endograft revision in the infrarenal abdominal aorta. Infrarenal abdominal aortic aneurysm measures 13.2 x 11.1 cm, previously 12.8 x 11 cm. There is also been extension into the right external iliac artery The soft tissue adjacent to the proximal aspect of the abdominal aortic aneurysm sac is morphologically abnormal and described in more detail below. Celiac: Mild atherosclerotic changes in the proximal segment. SMA: Mild atherosclerotic changes in the proximal segment. Renals: The left renal artery is small and demonstrates scant opacification. The right renal artery is not opacified. IMA: The IMA does not enhance in the very proximal origin. The mid segment reconstitutes. Inflow: There is been interval extension of the right iliac stent material into the right external iliac artery. The left limb of the endograft extends into the left common iliac artery. Both stent graft limbs are patent. Veins: There is displacement of the IVC by the infrarenal abdominal aortic aneurysm. Review of the  MIP images confirms the above findings. NON-VASCULAR Hepatobiliary: Contrast agent is present within the gallbladder. Pancreas: Nothing significant. Spleen: Normal in size without focal abnormality. Adrenals/Urinary Tract: Renal cortical atrophy with cystic changes noted bilaterally. Overall, the appearance of both kidneys is not significantly changed. Urinary bladder is decompressed. Stomach/Bowel: No dilated loops of bowel are appreciated. A moderate volume of stool material is present. Bowel containing left inguinal hernia. Lymphatic: The area adjacent to the proximal aspect of the aneurysm sac is abnormal in appearance. It is possible, that this area represents superimposed lymphadenopathy. A representative lesion is present which estimated at 2.5 cm on image 177 of series 6. Additional possible nodal tissue is present along the left lateral aspect of the aortic aneurysm which is annotated. Reproductive: Post treatment changes are present in the prostate. Other: There is stranding in the right retroperitoneum which is dense and favored to represent a retroperitoneal hematoma. On axial imaging, this measures approximately 8.2 x 2.3 cm. There is significant craniocaudal extent. Postsurgical changes are also present at the right femoral access site. Musculoskeletal: No acute  or significant osseous findings. Review of the MIP images confirms the above findings. IMPRESSION: 1. Postsurgical changes from revision aortic endograft. There is no overt evidence of endoleak. A retroperitoneal hematoma is present which is detailed above. The right renal artery is not opacified. 2. Aortic aneurysm measures 13.2 x 11.1 cm, previously 12.8 x 11 cm. 3. The cranial aspect of the abdominal aneurysm sac is abnormal in appearance. Differential considerations include morphologically irregular aneurysm sac versus retroperitoneal lymphadenopathy. Consideration can be given toward outpatient evaluation by PET-CT for problem solving. 4.  Small foci of bibasilar airspace consolidation with trace right pleural effusion. Electronically Signed   By: Reagan Camera M.D.   On: 06/19/2023 10:30    Cardiac Studies   none  Patient Profile     83 y.o. male admitted with AAA leak, afib with RVR, CAD and LV dysfunction on ESRD on HD  Assessment & Plan    Afib with RVR - he appears to be maintaining NSR. Ok to switch to oral amio 200 mg twice daily and stop IV amio.  HFREF - His weight is up 4 kg. He will need HD either today or tomorrow. Hyperkalemia - resolved.  Confusion - this is improved. He is conversant today and answers questions appropriately.      For questions or updates, please contact [facility] Please consult [email or web address] for contact info under Cardiology/STEMI.      Signed, Manya Sells, MD  06/21/2023, 9:09 AM

## 2023-06-21 NOTE — Plan of Care (Signed)
   Problem: Coping: Goal: Level of anxiety will decrease Outcome: Progressing

## 2023-06-21 NOTE — Progress Notes (Signed)
 [facility] CONSULT PROGRESS NOTE    Progress Note  Ronald [name]  QIO:[id number] DOB: 1940-07-16 DOA: 06/17/2023 PCP: Omie Bickers, MD     Brief Narrative:   Ronald [name] is an 83 y.o. male past medical history of end-stage renal disease on hemodialysis, dementia, essential hypertension, CAD with a history of CABG, chronic atrial fibrillation not on anticoagulation due to high risk of falls presented for an elective repair of her AAA, patient underwent a complex AAA repair on 06/17/2023, postop course was complicated by hyperkalemia acute anemia delirium and elevated troponins.  Assessment/Plan:   AAA (abdominal aortic aneurysm) (HCC) status post endovascular repair of aneurysm with stent, angioplasty, cutdown of the right common femoral artery and common femoral artery endarterectomy and angiogram by Dr. Fulton Job on 06/17/2023: Postop care per vascular surgery. Continue aspirin  and Plavix  per vascular surgery. Further management per vascular surgery, continue Lipitor. Anticoagulation for A-fib deferred to vascular surgery and cardiology. Will continue to follow at a distance.  Mild dementia/delirium/acute confusional state Now resolved. Open the windows  daily, close them at night,  to help minimize confusion.   Minimize disturbances specially at night, get his glasses and hearing aid from home. PT OT evaluation is pending. Stop Ambien , avoid  benzodiazepines and narcotics. On mirtazapine . Continue on melatonin at night.  Acute blood loss anemia in the setting of acute retroperitoneal hematoma:  Hemoglobin drop from 9.6-7.6 on 06/19/2023 he was transfused 1 unit of packed red blood cells. CT angio of the chest abdomen and pelvis show 8 x 2 cm retroperitoneal hematoma in the right.  No active extravasation. Transfusion threshold is 8. Defer aspirin  and Plavix  to vascular surgery. Hemoglobin this morning is 8.7. Try to keep hemoglobin greater than 8.  End-stage renal  disease on hemodialysis/hyperkalemia/hyperphosphatemia: Renal on board further management per renal.  Coronary artery disease/elevated troponins: Denies any chest pain but new ST segment depressions in twelve-lead EKG. Cardiology was consulted, further management per cardiology  Essential hypertension/Chronic congestive heart failure (HCC) Holding amlodipine  and Coreg , last 2D echo showed an EF of 40% with moderate aortic stenosis, grade 2 diastolic dysfunction. Appears euvolemic.  Paroxysmal atrial fibrillation: Not on anticoagulation due to his significant history of falls. Rapid was consulted yesterday due to RVR. Cardiology was consulted started on amiodarone  infusion and heart rate has remained less than 95. Further management per cardiology. Due to his retroperitoneal bleed on aspirin  and Plavix , will need to decide when to the start anticoagulation.  Will defer to vascular and cardiology.  BPH: Continue Flomax .  Mild hyponatremia: Remains stable 133-132 further management per renal  DVT prophylaxis: heparin  Family Communication:none Status is: Inpatient Remains inpatient appropriate because: AAA repair    Code Status:     Code Status Orders  (From admission, onward)           Start     Ordered   06/17/23 1646  Full code  Continuous       Question:  By:  Answer:  Other   06/17/23 1645           Code Status History     Date Active Date Inactive Code Status Order ID Comments User Context   11/13/2022 0759 11/17/2022 1926 Do not attempt resuscitation (DNR) PRE-ARREST INTERVENTIONS DESIRED [id number]  Demaris Fillers, MD Inpatient   11/12/2022 1841 11/13/2022 0759 Full Code [id number]  Pati Bonine, MD ED         IV Access:   Peripheral IV   Procedures and  diagnostic studies:   CT Angio Chest/Abd/Pel for Dissection W and/or W/WO Result Date: 06/19/2023 CLINICAL DATA:  Aortic aneurysm EXAM: CT ANGIOGRAPHY CHEST, ABDOMEN AND PELVIS TECHNIQUE:  Non-contrast CT of the chest was initially obtained. Multidetector CT imaging through the chest, abdomen and pelvis was performed using the standard protocol during bolus administration of intravenous contrast. Multiplanar reconstructed images and MIPs were obtained and reviewed to evaluate the vascular anatomy. RADIATION DOSE REDUCTION: This exam was performed according to the departmental dose-optimization program which includes automated exposure control, adjustment of the mA and/or kV according to patient size and/or use of iterative reconstruction technique. CONTRAST:  OMNIPAQUE  IOHEXOL  350 MG/ML SOLN COMPARISON:  None Available. FINDINGS: CTA CHEST FINDINGS Cardiovascular: Calcific atherosclerosis is present in the native coronary arteries. Aortic valve calcifications. The tubular ascending thoracic aorta is within normal limits measuring approximately 37 mm. Four vessel aortic arch with proximal origin of the left vertebral artery. Atherosclerotic changes are present in the descending thoracic aorta. Early venous filling is present in the left upper extremity, possibly secondary to vascular access. Enlarged heart. Mediastinum/Nodes: Postsurgical changes from coronary artery bypass grafting. Lungs/Pleura: Mild interstitial prominence. Trace bilateral pleural effusions. Patchy consolidation is present in the lower lobes, slightly worse on the right. Musculoskeletal: Degenerative changes are present in the imaged osseous structures. Review of the MIP images confirms the above findings. CTA ABDOMEN AND PELVIS FINDINGS VASCULAR Aorta: Post treatment changes from placement of an aortic endograft revision in the infrarenal abdominal aorta. Infrarenal abdominal aortic aneurysm measures 13.2 x 11.1 cm, previously 12.8 x 11 cm. There is also been extension into the right external iliac artery The soft tissue adjacent to the proximal aspect of the abdominal aortic aneurysm sac is morphologically abnormal and  described in more detail below. Celiac: Mild atherosclerotic changes in the proximal segment. SMA: Mild atherosclerotic changes in the proximal segment. Renals: The left renal artery is small and demonstrates scant opacification. The right renal artery is not opacified. IMA: The IMA does not enhance in the very proximal origin. The mid segment reconstitutes. Inflow: There is been interval extension of the right iliac stent material into the right external iliac artery. The left limb of the endograft extends into the left common iliac artery. Both stent graft limbs are patent. Veins: There is displacement of the IVC by the infrarenal abdominal aortic aneurysm. Review of the MIP images confirms the above findings. NON-VASCULAR Hepatobiliary: Contrast agent is present within the gallbladder. Pancreas: Nothing significant. Spleen: Normal in size without focal abnormality. Adrenals/Urinary Tract: Renal cortical atrophy with cystic changes noted bilaterally. Overall, the appearance of both kidneys is not significantly changed. Urinary bladder is decompressed. Stomach/Bowel: No dilated loops of bowel are appreciated. A moderate volume of stool material is present. Bowel containing left inguinal hernia. Lymphatic: The area adjacent to the proximal aspect of the aneurysm sac is abnormal in appearance. It is possible, that this area represents superimposed lymphadenopathy. A representative lesion is present which estimated at 2.5 cm on image 177 of series 6. Additional possible nodal tissue is present along the left lateral aspect of the aortic aneurysm which is annotated. Reproductive: Post treatment changes are present in the prostate. Other: There is stranding in the right retroperitoneum which is dense and favored to represent a retroperitoneal hematoma. On axial imaging, this measures approximately 8.2 x 2.3 cm. There is significant craniocaudal extent. Postsurgical changes are also present at the right femoral access  site. Musculoskeletal: No acute or significant osseous findings. Review of the  MIP images confirms the above findings. IMPRESSION: 1. Postsurgical changes from revision aortic endograft. There is no overt evidence of endoleak. A retroperitoneal hematoma is present which is detailed above. The right renal artery is not opacified. 2. Aortic aneurysm measures 13.2 x 11.1 cm, previously 12.8 x 11 cm. 3. The cranial aspect of the abdominal aneurysm sac is abnormal in appearance. Differential considerations include morphologically irregular aneurysm sac versus retroperitoneal lymphadenopathy. Consideration can be given toward outpatient evaluation by PET-CT for problem solving. 4. Small foci of bibasilar airspace consolidation with trace right pleural effusion. Electronically Signed   By: Reagan Camera M.D.   On: 06/19/2023 10:30     Medical Consultants:   None.   Subjective:    Ronald [name] no complaints today.  Objective:    Vitals:   06/20/23 1946 06/20/23 2310 06/21/23 0413 06/21/23 0458  BP: 135/71 123/61 (!) 140/66 (!) 140/66  Pulse: 96 75  79  Resp: 20 16 20 20   Temp: 98.2 F (36.8 C)  98.4 F (36.9 C) 99 F (37.2 C)  TempSrc: Oral Axillary Oral Oral  SpO2: 97% 96% 95%   Weight:   98.2 kg   Height:       SpO2: 95 %   Intake/Output Summary (Last 24 hours) at 06/21/2023 0842 Last data filed at 06/21/2023 0800 Gross per 24 hour  Intake 1371.61 ml  Output --  Net 1371.61 ml   Filed Weights   06/18/23 0854 06/18/23 1309 06/21/23 0413  Weight: 93.4 kg 94 kg 98.2 kg    Exam: General exam: In no acute distress. Respiratory system: Good air movement and clear to auscultation. Cardiovascular system: S1 & S2 heard, RRR. No JVD. Gastrointestinal system: Abdomen is nondistended, soft and nontender.  Extremities: No pedal edema. Skin: No rashes, lesions or ulcers Psychiatry: No judgment or insight of medical condition   Data Reviewed:    Labs: Basic Metabolic  Panel: Recent Labs  Lab 06/17/23 1532 06/17/23 1839 06/18/23 0331 06/18/23 0333 06/18/23 1832 06/19/23 1007 06/20/23 0358  NA 134* 133* 135 133* 134* 133* 132*  K 5.1 5.4* 6.6* 6.6* 4.1 4.8 4.3  CL 102 103 104 103 95* 95* 94*  CO2 18* 18* 20* 19* 24 22 21*  GLUCOSE 241* 156* 144* 142* 112* 111* 108*  BUN 36* 39* 42* 42* 25* 35* 45*  CREATININE 6.90* 7.14* 7.58* 7.65* 5.01* 6.38* 7.86*  CALCIUM  7.4* 7.5* 8.0* 8.0* 8.0* 8.1* 8.3*  MG  --  1.8  --   --   --   --   --   PHOS 6.1*  --   --  7.3*  --  7.8* 8.1*   GFR Estimated Creatinine Clearance: 8.6 mL/min (A) (by C-G formula based on SCr of 7.86 mg/dL (H)). Liver Function Tests: Recent Labs  Lab 06/17/23 1532 06/18/23 0333 06/19/23 1007 06/20/23 0358  ALBUMIN  2.6* 3.2* 2.9* 2.9*   No results for input(s): "LIPASE", "AMYLASE" in the last 168 hours. No results for input(s): "AMMONIA" in the last 168 hours. Coagulation profile Recent Labs  Lab 06/17/23 1839  INR 1.4*   COVID-19 Labs  No results for input(s): "DDIMER", "FERRITIN", "LDH", "CRP" in the last 72 hours.  Lab Results  Component Value Date   SARSCOV2NAA Not Detected 07/30/2020    CBC: Recent Labs  Lab 06/17/23 1839 06/18/23 0331 06/18/23 1832 06/19/23 1007 06/20/23 0941  WBC 13.2* 14.4* 11.0* 12.9* 16.9*  HGB 9.6* 9.5* 7.4* 9.4* 8.7*  HCT 29.2* 28.8* 22.4* 29.1*  26.6*  MCV 88.0 88.1 89.2 87.9 86.1  PLT 151 157 123* 118* 108*   Cardiac Enzymes: No results for input(s): "CKTOTAL", "CKMB", "CKMBINDEX", "TROPONINI" in the last 168 hours. BNP (last 3 results) No results for input(s): "PROBNP" in the last 8760 hours. CBG: Recent Labs  Lab 06/19/23 1933  GLUCAP 113*   D-Dimer: No results for input(s): "DDIMER" in the last 72 hours. Hgb A1c: No results for input(s): "HGBA1C" in the last 72 hours. Lipid Profile: No results for input(s): "CHOL", "HDL", "LDLCALC", "TRIG", "CHOLHDL", "LDLDIRECT" in the last 72 hours. Thyroid  function  studies: Recent Labs    06/19/23 1007  TSH 3.984   Anemia work up: No results for input(s): "VITAMINB12", "FOLATE", "FERRITIN", "TIBC", "IRON", "RETICCTPCT" in the last 72 hours. Sepsis Labs: Recent Labs  Lab 06/18/23 0331 06/18/23 1832 06/19/23 1007 06/20/23 0941  WBC 14.4* 11.0* 12.9* 16.9*   Microbiology No results found for this or any previous visit (from the past 240 hours).    Medications:    sodium chloride    Intravenous Once   aspirin  EC  81 mg Oral Daily   atorvastatin   80 mg Oral q AM   calcium  acetate  1,334 mg Oral TID WC   Chlorhexidine  Gluconate Cloth  6 each Topical Q0600   Chlorhexidine  Gluconate Cloth  6 each Topical Q0600   clopidogrel   75 mg Oral Daily   docusate sodium   100 mg Oral Daily   heparin   5,000 Units Subcutaneous Q8H   melatonin  3 mg Oral QHS   mirtazapine   15 mg Oral QHS   pantoprazole   40 mg Oral q AM   sodium chloride  flush  3 mL Intravenous Q12H   tamsulosin   0.4 mg Oral QPC supper   Continuous Infusions:  sodium chloride      amiodarone  30 mg/hr (06/21/23 0038)   magnesium  sulfate bolus IVPB        LOS: 4 days   Ronald [name]  [facility]  06/21/2023, 8:42 AM

## 2023-06-21 NOTE — Evaluation (Signed)
 Physical Therapy Evaluation Patient Details Name: Ronald [name] MRN: [id number] DOB: 07-Nov-1940 Today's Date: 06/21/2023  History of Present Illness  The pt is an 83 yo male presenting 5/7 for  endovascular aneurysm repair for AAA via R groin which also required right external iliac stenting and right common femoral cutdown including femoral and SFA endarterectomy. Admission complicated by pt going into afib with hypotension during HD on 5/8, and found to have anemia in setting of retroperitoneal hematoma. PMH includes: afib, galucoma, HTN, macular degeneration, MI, CAD s/p CABG, prostate cancer, stroke, and ESRD.   Clinical Impression  Pt in bed upon arrival of PT, agreeable to evaluation at this time. Prior to admission the pt was ambulating in the house with UE support on walls/furniture but not using DME. His wife reports he is largely sedentary, but no hx of falls. The pt was agreeable to mobility, able to complete bed mobility and sit-stand transfers with min-modA. He was able to complete up to 75 ft ambulation with largely CGA and use of RW with HR in 90s and otherwise stable. Pt with deficits in LE power, stability, and endurance, will need to complete stair training and further mobility progression prior to anticipated return home, will continue to follow acutely.          If plan is discharge home, recommend the following: A little help with walking and/or transfers;A little help with bathing/dressing/bathroom;Assistance with cooking/housework;Direct supervision/assist for medications management;Direct supervision/assist for financial management;Assist for transportation;Help with stairs or ramp for entrance   Can travel by private vehicle        Equipment Recommendations Rolling walker (2 wheels);BSC/3in1  Recommendations for Other Services       Functional Status Assessment Patient has had a recent decline in their functional status and demonstrates the ability to make  significant improvements in function in a reasonable and predictable amount of time.     Precautions / Restrictions Precautions Precautions: Fall Recall of Precautions/Restrictions: Intact Restrictions Weight Bearing Restrictions Per Provider Order: No      Mobility  Bed Mobility Overal bed mobility: Needs Assistance Bed Mobility: Supine to Sit     Supine to sit: Min assist, HOB elevated, Used rails     General bed mobility comments: pt needing minA to assist LE, cues to use bed rail to complete movement    Transfers Overall transfer level: Needs assistance Equipment used: Rolling walker (2 wheels) Transfers: Sit to/from Stand Sit to Stand: Mod assist, Min assist           General transfer comment: modA initially to minA from EOB with cues for hand placement on RW    Ambulation/Gait Ambulation/Gait assistance: Contact guard assist, Min assist Gait Distance (Feet): 75 Feet Assistive device: Rolling walker (2 wheels) Gait Pattern/deviations: Step-through pattern, Decreased stride length, Trunk flexed Gait velocity: decreased Gait velocity interpretation: <1.31 ft/sec, indicative of household ambulator   General Gait Details: small steps with trunk flexed. cues for positioning in RW but largely CGA. no buckling HR in 90s      Balance Overall balance assessment: Needs assistance Sitting-balance support: No upper extremity supported, Feet supported Sitting balance-Leahy Scale: Fair     Standing balance support: Bilateral upper extremity supported, During functional activity Standing balance-Leahy Scale: Poor Standing balance comment: BUE support with gait                             Pertinent Vitals/Pain Pain Assessment Pain Assessment:  No/denies pain    Home Living Family/patient expects to be discharged to:: Private residence Living Arrangements: Spouse/significant other Available Help at Discharge: Family;Available 24 hours/day Type of  Home: House Home Access: Stairs to enter Entrance Stairs-Rails: Left Entrance Stairs-Number of Steps: 5   Home Layout: One level Home Equipment: Agricultural consultant (2 wheels);Cane - single point;Hand held shower head;Grab bars - tub/shower      Prior Function Prior Level of Function : Independent/Modified Independent             Mobility Comments: pt notusing DME, no falls, hold walls when walking. largely sedentary sitting in office chair in kitchen ADLs Comments: some assist from wife     Extremity/Trunk Assessment   Upper Extremity Assessment Upper Extremity Assessment: Defer to OT evaluation;Generalized weakness    Lower Extremity Assessment Lower Extremity Assessment: Generalized weakness;RLE deficits/detail RLE Deficits / Details: limited hip flexion due to groin site, no changes in sensation or strength at knee/ankle RLE Sensation: WNL RLE Coordination: WNL    Cervical / Trunk Assessment Cervical / Trunk Assessment: Kyphotic  Communication   Communication Communication: No apparent difficulties    Cognition Arousal: Alert Behavior During Therapy: Flat affect   PT - Cognitive impairments: History of cognitive impairments                       PT - Cognition Comments: pt with hx of dementia. defers to wife to answer questions but able to follow commands and state needs appropriately Following commands: Intact       Cueing Cueing Techniques: Verbal cues     General Comments General comments (skin integrity, edema, etc.): HR in 90s with gait, wife and sitter present    Exercises     Assessment/Plan    PT Assessment Patient needs continued PT services  PT Problem List Decreased strength;Decreased range of motion;Decreased activity tolerance;Decreased balance;Decreased mobility;Decreased safety awareness       PT Treatment Interventions Gait training;DME instruction;Stair training;Functional mobility training;Therapeutic activities;Therapeutic  exercise;Balance training;Patient/family education    PT Goals (Current goals can be found in the Care Plan section)  Acute Rehab PT Goals Patient Stated Goal: return to independence PT Goal Formulation: With patient/family Time For Goal Achievement: 07/05/23 Potential to Achieve Goals: Good    Frequency Min 2X/week        AM-PAC PT "6 Clicks" Mobility  Outcome Measure Help needed turning from your back to your side while in a flat bed without using bedrails?: A Little Help needed moving from lying on your back to sitting on the side of a flat bed without using bedrails?: A Little Help needed moving to and from a bed to a chair (including a wheelchair)?: A Little Help needed standing up from a chair using your arms (e.g., wheelchair or bedside chair)?: A Little Help needed to walk in hospital room?: A Little Help needed climbing 3-5 steps with a railing? : A Lot 6 Click Score: 17    End of Session Equipment Utilized During Treatment: Gait belt Activity Tolerance: Patient tolerated treatment well Patient left: in chair;with call bell/phone within reach;with chair alarm set;with family/visitor present;with nursing/sitter in room Nurse Communication: Mobility status PT Visit Diagnosis: Unsteadiness on feet (R26.81);Other abnormalities of gait and mobility (R26.89)    Time: 2951-8841 PT Time Calculation (min) (ACUTE ONLY): 25 min   Charges:   PT Evaluation $PT Eval Moderate Complexity: 1 Mod PT Treatments $Therapeutic Exercise: 8-22 mins PT General Charges $$ ACUTE PT VISIT: 1  Visit         Barnabas Booth, PT, DPT   Acute Rehabilitation Department Office [phone] Secure Chat Communication Preferred  Lona Rist 06/21/2023, 1:46 PM

## 2023-06-21 NOTE — Progress Notes (Addendum)
  Progress Note    06/21/2023 7:41 AM 4 Days Post-Op  Subjective:  pt more alert this morning.  No sitter this morning.  States his abdominal pain is about the same.    Tm 99 HR 70's-90's NSR 120's-140's systolic 95% RA  Vitals:   06/21/23 0413 06/21/23 0458  BP: (!) 140/66 (!) 140/66  Pulse:  79  Resp: 20 20  Temp: 98.4 F (36.9 C) 99 F (37.2 C)  SpO2: 95%     Physical Exam: General:  no distress Cardiac:  regular Lungs:  non labored Incisions:  right groin is clean and dry Extremities:  brisk doppler flow bilateral PT  Abdomen:  soft but still tender RLQ otherwise non tender to palpation.  CBC    Component Value Date/Time   WBC 16.9 (H) 06/20/2023 0941   RBC 3.09 (L) 06/20/2023 0941   HGB 8.7 (L) 06/20/2023 0941   HCT 26.6 (L) 06/20/2023 0941   PLT 108 (L) 06/20/2023 0941   MCV 86.1 06/20/2023 0941   MCH 28.2 06/20/2023 0941   MCHC 32.7 06/20/2023 0941   RDW 18.7 (H) 06/20/2023 0941   LYMPHSABS 1.3 09/12/2015 1015   MONOABS 0.6 09/12/2015 1015   EOSABS 0.2 09/12/2015 1015   BASOSABS 0.0 09/12/2015 1015    BMET    Component Value Date/Time   NA 132 (L) 06/20/2023 0358   K 4.3 06/20/2023 0358   CL 94 (L) 06/20/2023 0358   CO2 21 (L) 06/20/2023 0358   GLUCOSE 108 (H) 06/20/2023 0358   BUN 45 (H) 06/20/2023 0358   CREATININE 7.86 (H) 06/20/2023 0358   CALCIUM  8.3 (L) 06/20/2023 0358   GFRNONAA 6 (L) 06/20/2023 0358   GFRAA 15 (L) 08/10/2019 1109    INR    Component Value Date/Time   INR 1.4 (H) 06/17/2023 1839     Intake/Output Summary (Last 24 hours) at 06/21/2023 0741 Last data filed at 06/21/2023 0606 Gross per 24 hour  Intake 1162.21 ml  Output --  Net 1162.21 ml      Assessment/Plan:  83 y.o. male is s/p:  EVAR with abdominal stent graft using a physician modified endoprosthesis (Gore 45 mm x 10 cm thoracic endograft with back table modification creating scallop for SMA), angioplasty and stent of right external iliac artery,  cutdown on the right common femoral artery for open common femoral exposure, right common femoral endarterectomy including endarterectomy of the proximal SFA and profunda, right lower extremity angiogram, endarterectomy of the proximal SFA with bovine patch angioplasty for 13cm AAA by Dr. Fulton Job   4 Days Post-Op   -more alert this morning  -pt continues to have brisk doppler flow bilateral PT -abdomen tenderness RLQ unchanged -DVT prophylaxis:  SCD's with RP bleed but no active extravasation on CTA Friday.  Will need to restart when able with hx of afib.  -needs to mobilize out of bed-will consult PT/OT -check CBC in am -appreciate cards and [facility] assistance with this pt.   Maryanna Smart, PA-C [facility] [phone] 06/21/2023 7:41 AM  I have independently interviewed and examined patient and agree with PA assessment and plan above.  No evidence of ongoing bleeding.  Continue to mobilize.  Erroll Wilbourne C. Vikki Graves, MD [facility] [place] Office: [phone] Pager: [phone]

## 2023-06-21 NOTE — Progress Notes (Signed)
  [facility] Progress Note   Subjective:  Seen in room. Doing well this am. No events overnight. No cp, sob   Objective Vitals:   06/21/23 0413 06/21/23 0458 06/21/23 0900 06/21/23 1008  BP: (!) 140/66 (!) 140/66  130/66  Pulse:  79  74  Resp: 20 20 16 18   Temp: 98.4 F (36.9 C) 99 F (37.2 C)  98.6 F (37 C)  TempSrc: Oral Oral  Oral  SpO2: 95%   96%  Weight: 98.2 kg     Height:          Additional Objective Labs: Basic Metabolic Panel: Recent Labs  Lab 06/19/23 1007 06/20/23 0358 06/21/23 0815  NA 133* 132* 131*  K 4.8 4.3 4.3  CL 95* 94* 93*  CO2 22 21* 20*  GLUCOSE 111* 108* 104*  BUN 35* 45* 61*  CREATININE 6.38* 7.86* 9.71*  CALCIUM  8.1* 8.3* 8.3*  PHOS 7.8* 8.1* 8.1*   CBC: Recent Labs  Lab 06/17/23 1839 06/18/23 0331 06/18/23 1832 06/19/23 1007 06/20/23 0941  WBC 13.2* 14.4* 11.0* 12.9* 16.9*  HGB 9.6* 9.5* 7.4* 9.4* 8.7*  HCT 29.2* 28.8* 22.4* 29.1* 26.6*  MCV 88.0 88.1 89.2 87.9 86.1  PLT 151 157 123* 118* 108*   Blood Culture No results found for: "SDES", "SPECREQUEST", "CULT", "REPTSTATUS"   Physical Exam General: Alert, lying in bed, nad Heart: RRR Lungs: Clear bilaterally  Abdomen: soft non-tender Extremities: No LE edema  Dialysis Access: L AVF +bruit   Medications:  sodium chloride      magnesium  sulfate bolus IVPB      sodium chloride    Intravenous Once   amiodarone   200 mg Oral BID   aspirin  EC  81 mg Oral Daily   atorvastatin   80 mg Oral q AM   calcium  acetate  1,334 mg Oral TID WC   Chlorhexidine  Gluconate Cloth  6 each Topical Q0600   Chlorhexidine  Gluconate Cloth  6 each Topical Q0600   clopidogrel   75 mg Oral Daily   docusate sodium   100 mg Oral Daily   heparin   5,000 Units Subcutaneous Q8H   melatonin  3 mg Oral QHS   mirtazapine   15 mg Oral QHS   pantoprazole   40 mg Oral q AM   sodium chloride  flush  3 mL Intravenous Q12H   tamsulosin   0.4 mg Oral QPC supper    Assessment/Plan: 83 y.o.  male with ESRD on HD, CAD s/p CABG. HTN, HFrEF, GERD, PAD currently admitted after planned EVAR  by Dr. Fulton Job and nephrology is consulted for comanagement of ESRD and assoc conditions.   AAA s/p EVAR 5/8 per [facility].  Retroperitoneal hematoma. Found on CT . Follow H/H.  ESRD - Biweekly HD M/F at [facility]. Had HD x2 this week. No urgent indication for dialysis today. Plan next HD Mon. No heparin  with HD.  Anemia of ESRD + ABLA. Serial H/H. Transfusion threshold < 8. S/p prbcs on 5/9.  Received Aranesp 60 mcg on 5/10.  HTN/Volume - BP stable. UF 2-3L with HD Mon MBD.   Ca acceptable. Phos above goal. Continue home binder.  Afib w RVR - Cardiology consulted. Amiodarone  started.  Delirium/confusion - requiring sitter, mittens    Bensen Chadderdon Alberto Hughes PA-C [facility] 06/21/2023,11:45 AM

## 2023-06-22 DIAGNOSIS — Z951 Presence of aortocoronary bypass graft: Secondary | ICD-10-CM

## 2023-06-22 DIAGNOSIS — Z9889 Other specified postprocedural states: Secondary | ICD-10-CM

## 2023-06-22 DIAGNOSIS — I4891 Unspecified atrial fibrillation: Secondary | ICD-10-CM | POA: Diagnosis not present

## 2023-06-22 DIAGNOSIS — I5022 Chronic systolic (congestive) heart failure: Secondary | ICD-10-CM

## 2023-06-22 DIAGNOSIS — Z992 Dependence on renal dialysis: Secondary | ICD-10-CM | POA: Diagnosis not present

## 2023-06-22 DIAGNOSIS — D649 Anemia, unspecified: Secondary | ICD-10-CM

## 2023-06-22 DIAGNOSIS — I10 Essential (primary) hypertension: Secondary | ICD-10-CM | POA: Diagnosis not present

## 2023-06-22 DIAGNOSIS — I714 Abdominal aortic aneurysm, without rupture, unspecified: Secondary | ICD-10-CM | POA: Diagnosis not present

## 2023-06-22 DIAGNOSIS — I480 Paroxysmal atrial fibrillation: Secondary | ICD-10-CM

## 2023-06-22 DIAGNOSIS — I350 Nonrheumatic aortic (valve) stenosis: Secondary | ICD-10-CM | POA: Diagnosis not present

## 2023-06-22 DIAGNOSIS — N186 End stage renal disease: Secondary | ICD-10-CM | POA: Diagnosis not present

## 2023-06-22 DIAGNOSIS — Z8679 Personal history of other diseases of the circulatory system: Secondary | ICD-10-CM

## 2023-06-22 LAB — CBC
HCT: 26.4 % — ABNORMAL LOW (ref 39.0–52.0)
Hemoglobin: 8.7 g/dL — ABNORMAL LOW (ref 13.0–17.0)
MCH: 28.4 pg (ref 26.0–34.0)
MCHC: 33 g/dL (ref 30.0–36.0)
MCV: 86.3 fL (ref 80.0–100.0)
Platelets: 138 10*3/uL — ABNORMAL LOW (ref 150–400)
RBC: 3.06 MIL/uL — ABNORMAL LOW (ref 4.22–5.81)
RDW: 18.4 % — ABNORMAL HIGH (ref 11.5–15.5)
WBC: 9.3 10*3/uL (ref 4.0–10.5)
nRBC: 0 % (ref 0.0–0.2)

## 2023-06-22 MED ORDER — METOPROLOL SUCCINATE ER 25 MG PO TB24
25.0000 mg | ORAL_TABLET | Freq: Every day | ORAL | Status: DC
  Administered 2023-06-22 – 2023-06-23 (×2): 25 mg via ORAL
  Filled 2023-06-22: qty 1

## 2023-06-22 MED ORDER — CHLORHEXIDINE GLUCONATE CLOTH 2 % EX PADS: 6.0000 | MEDICATED_PAD | Freq: Every day | CUTANEOUS | Status: DC

## 2023-06-22 MED ORDER — AMIODARONE HCL 200 MG PO TABS
200.0000 mg | ORAL_TABLET | Freq: Two times a day (BID) | ORAL | Status: DC
  Administered 2023-06-22 – 2023-06-23 (×3): 200 mg via ORAL
  Filled 2023-06-22 (×3): qty 1

## 2023-06-22 MED ORDER — AMIODARONE HCL 200 MG PO TABS: 200.0000 mg | ORAL_TABLET | Freq: Every day | ORAL | Status: DC

## 2023-06-22 NOTE — Care Management Important Message (Signed)
 Important Message  Patient Details  Name: DAYMEIN GELFAND MRN: [id number] Date of Birth: 04-03-1940   Important Message Given:  Yes - Medicare IM     Janith Melnick 06/22/2023, 11:26 AM

## 2023-06-22 NOTE — Progress Notes (Addendum)
 Progress Note    06/22/2023 7:51 AM 5 Days Post-Op  Subjective:  no major complaints. Hopeful to go home soon   Vitals:   06/21/23 2305 06/22/23 0308  BP: 135/85 (!) 125/99  Pulse:  87  Resp: 20 19  Temp: 98.1 F (36.7 C) 98 F (36.7 C)  SpO2: 98% 97%   Physical Exam: Cardiac:  regular Lungs:  non labored Incisions: Right groin incision  Extremities:  BLE well perfused and warm with brisk PT signals Abdomen:  soft, non tender, non distended Neurologic: alert and oriented  CBC    Component Value Date/Time   WBC 9.3 06/22/2023 0404   RBC 3.06 (L) 06/22/2023 0404   HGB 8.7 (L) 06/22/2023 0404   HCT 26.4 (L) 06/22/2023 0404   PLT 138 (L) 06/22/2023 0404   MCV 86.3 06/22/2023 0404   MCH 28.4 06/22/2023 0404   MCHC 33.0 06/22/2023 0404   RDW 18.4 (H) 06/22/2023 0404   LYMPHSABS 1.3 09/12/2015 1015   MONOABS 0.6 09/12/2015 1015   EOSABS 0.2 09/12/2015 1015   BASOSABS 0.0 09/12/2015 1015    BMET    Component Value Date/Time   NA 131 (L) 06/21/2023 0815   K 4.3 06/21/2023 0815   CL 93 (L) 06/21/2023 0815   CO2 20 (L) 06/21/2023 0815   GLUCOSE 104 (H) 06/21/2023 0815   BUN 61 (H) 06/21/2023 0815   CREATININE 9.71 (H) 06/21/2023 0815   CALCIUM  8.3 (L) 06/21/2023 0815   GFRNONAA 5 (L) 06/21/2023 0815   GFRAA 15 (L) 08/10/2019 1109    INR    Component Value Date/Time   INR 1.4 (H) 06/17/2023 1839     Intake/Output Summary (Last 24 hours) at 06/22/2023 0751 Last data filed at 06/21/2023 1415 Gross per 24 hour  Intake 509.4 ml  Output --  Net 509.4 ml     Assessment/Plan:  83 y.o. male is s/p EVAR with abdominal stent graft using a physician modified endoprosthesis (Gore 45 mm x 10 cm thoracic endograft with back table modification creating scallop for SMA), angioplasty and stent of right external iliac artery, cutdown on the right common femoral artery for open common femoral exposure, right common femoral endarterectomy including endarterectomy of the  proximal SFA and profunda, right lower extremity angiogram, endarterectomy of the proximal SFA with bovine patch angioplasty for 13cm AAA  5 Days Post-Op   Right groin incisions are intact and well appearing. Ecchymosis present but stable. No swelling or hematoma Pain improving BLE well perfused and warm with brisk doppler PT signals H&H stable. Hgb 8.7. No indication for transfusion at this time. No signs of further bleeding Encourage mobilization today. PT/OT PT recommending HH services. Order placed PAF- on pop Amio per Cards On Asa and Plavix  HD today  Appreciate Cards/ [facility] and Nephrology assistance    Deneen Finical, [facility] [phone] 06/22/2023 7:51 AM  I have seen and evaluated the patient. I agree with the PA note as documented above.  Status post physician modified endograft with SMA scallop with abdominal aortic stenting for large 13 cm AAA.  This required right iliac stenting as well as right common femoral and SFA endarterectomy.  Looks much better today.  Mental status improved.  Right groin looks great.  Brisk PT signals bilaterally.  Hemoglobin stable at 8.7.  White count normalized.  Hopefully discharge later today and follow-up in about 4 weeks with CTA.  Will plan aspirin  statin Plavix  at discharge.  I do not favor full  anticoagulation for his chronic A-fib given his fall risk in addition to his retroperitoneal bleed.  He has had known A-fib in the past and not been on anticoagulation before.  Will need amiodarone  and appreciate cardiology input.  Ronald Hensen, MD [facility] [place] Office: [phone]

## 2023-06-22 NOTE — Progress Notes (Signed)
 Progress Note  Patient Name: Ronald [name] Date of Encounter: 06/22/2023  Primary Cardiologist: Janelle Mediate, MD   Subjective   No CP or Shortness of breath Alert to himself, place, year   Inpatient Medications    Scheduled Meds:  sodium chloride    Intravenous Once   amiodarone   200 mg Oral BID   Followed by   Cecily Cohen ON 06/29/2023] amiodarone   200 mg Oral Daily   aspirin  EC  81 mg Oral Daily   atorvastatin   80 mg Oral q AM   calcium  acetate  1,334 mg Oral TID WC   Chlorhexidine  Gluconate Cloth  6 each Topical Q0600   clopidogrel   75 mg Oral Daily   docusate sodium   100 mg Oral Daily   heparin   5,000 Units Subcutaneous Q8H   melatonin  3 mg Oral QHS   metoprolol  succinate  25 mg Oral Daily   mirtazapine   15 mg Oral QHS   pantoprazole   40 mg Oral q AM   sodium chloride  flush  3 mL Intravenous Q12H   tamsulosin   0.4 mg Oral QPC supper   Continuous Infusions:  sodium chloride      magnesium  sulfate bolus IVPB     PRN Meds: sodium chloride , acetaminophen  **OR** acetaminophen , bisacodyl , guaiFENesin -dextromethorphan, HYDROmorphone  (DILAUDID ) injection, magnesium  sulfate bolus IVPB, metoprolol  tartrate, ondansetron , oxyCODONE , phenol, senna-docusate, sodium chloride  flush   Vital Signs    Vitals:   06/21/23 1921 06/21/23 2305 06/22/23 0308 06/22/23 0905  BP: 128/63 135/85 (!) 125/99 (!) 149/93  Pulse:   87 83  Resp: 19 20 19 19   Temp:  98.1 F (36.7 C) 98 F (36.7 C) 98 F (36.7 C)  TempSrc:  Oral Oral Oral  SpO2: 98% 98% 97% 93%  Weight:   99.9 kg   Height:        Intake/Output Summary (Last 24 hours) at 06/22/2023 0933 Last data filed at 06/21/2023 1415 Gross per 24 hour  Intake 240 ml  Output --  Net 240 ml   Filed Weights   06/18/23 1309 06/21/23 0413 06/22/23 0308  Weight: 94 kg 98.2 kg 99.9 kg    Telemetry    Probable NSR - Personally Reviewed  ECG & studies    EKG: none - Personally Reviewed  Echo:  11/2022  1. Left ventricular  ejection fraction, by estimation, is 40 to 45%. The left ventricle has mildly decreased function. The left ventricle demonstrates global hypokinesis. There is mild left ventricular hypertrophy. Left ventricular diastolic parameters are consistent with Grade II diastolic dysfunction (pseudonormalization).  Elevated left atrial pressure.   2. Right ventricular systolic function is normal. The right ventricular size is normal. Tricuspid regurgitation signal is inadequate for assessing PA pressure.   3. Left atrial size was severely dilated.   4. Right atrial size was mildly dilated.   5. The mitral valve is abnormal. Mild mitral valve regurgitation. No  evidence of mitral stenosis.   6. The tricuspid valve is abnormal.   7. The aortic valve has an indeterminant number of cusps. There is moderate calcification of the aortic valve. There is moderate thickening of the aortic valve. Aortic valve regurgitation is not visualized. Moderate aortic valve stenosis. Aortic valve mean gradient measures 18.8 mmHg. Aortic valve peak gradient measures 44.4 mmHg. Aortic valve area, by VTI measures 1.10 cm. DI 0.29  8. The inferior vena cava is dilated in size with <50% respiratory  variability, suggesting right atrial pressure of 15 mmHg.   Physical Exam   GEN:  No acute distress.   Neck: No JVD Cardiac: RRR, no murmurs, rubs, or gallops.  Respiratory: Clear to auscultation bilaterally no w/ r/ /r  GI: Soft, nontender, non-distended  MS: No edema; No deformity. Ecchymoses bilateral arms/hand. HD access noted.  Neuro:  Nonfocal. Psych: Normal affect, cooperative, conversing well.   Labs    Chemistry Recent Labs  Lab 06/19/23 1007 06/20/23 0358 06/21/23 0815  NA 133* 132* 131*  K 4.8 4.3 4.3  CL 95* 94* 93*  CO2 22 21* 20*  GLUCOSE 111* 108* 104*  BUN 35* 45* 61*  CREATININE 6.38* 7.86* 9.71*  CALCIUM  8.1* 8.3* 8.3*  ALBUMIN  2.9* 2.9* 2.5*  GFRNONAA 8* 6* 5*  ANIONGAP 16* 17* 18*      Hematology Recent Labs  Lab 06/19/23 1007 06/20/23 0941 06/22/23 0404  WBC 12.9* 16.9* 9.3  RBC 3.31* 3.09* 3.06*  HGB 9.4* 8.7* 8.7*  HCT 29.1* 26.6* 26.4*  MCV 87.9 86.1 86.3  MCH 28.4 28.2 28.4  MCHC 32.3 32.7 33.0  RDW 18.8* 18.7* 18.4*  PLT 118* 108* 138*    Cardiac EnzymesNo results for input(s): "TROPONINI" in the last 168 hours. No results for input(s): "TROPIPOC" in the last 168 hours.   BNPNo results for input(s): "BNP", "PROBNP" in the last 168 hours.   DDimer No results for input(s): "DDIMER" in the last 168 hours.   Radiology    CTA Chest Abd and Pelvis:  06/19/2023 1. Postsurgical changes from revision aortic endograft. There is no overt evidence of endoleak. A retroperitoneal hematoma is present which is detailed above. The right renal artery is not opacified. 2. Aortic aneurysm measures 13.2 x 11.1 cm, previously 12.8 x 11 cm. 3. The cranial aspect of the abdominal aneurysm sac is abnormal in appearance. Differential considerations include morphologically irregular aneurysm sac versus retroperitoneal lymphadenopathy. Consideration can be given toward outpatient evaluation by PET-CT for problem solving. 4. Small foci of bibasilar airspace consolidation with trace right pleural effusion.   Cardiac Studies   none  Patient Profile     83 y.o. male admitted with AAA leak, afib with RVR, CAD and LV dysfunction on ESRD on HD  Assessment & Plan    Paroxysmal Afib & Afib with RVR - he appears to be maintaining NSR. Ok to switch to oral amio 200 mg twice daily and stop IV amio.  Per EMR dx w/ Afib back in 07/2020 and in 11/2022 [facility] was stopped due to patient falling.  Per EMR not on OAC due to dementia, fall risk. Also he has had a RP hematoma , anemia s/p PRBCs, thrombocytopenia Cha2ds2VASs 6 Currently on Amiodarone  200 mg po bid - will continue for 7 days.  On 06/29/2023 Will transition to Amiodarone  200 mg po qday for 2 week.  Long-term would  recommend AV nodal for rate control as he is not on OAC.   HFmrEF Volume management per HD.  GDMT limited due to ESRD on HD  Home meds: norvasc  5mg  qday, Coreg  25mg  po bid.  Will start Toprol  xl 25 mg po qAM w/ holding parameters.  If BP allow would recommend low dose hydralazine  and isordil.   Aortic stenosis:  Moderate as per echo in October 2024 Currently asymptomatic.   CAD w/ hx of CABG w/ elevated hstroponin:  No chest pain Last EKG on 06/18/2023 noted Afib w/ RVR with diffuse ST-T changes  Troponin leak likely demand in the setting of severe anemia of 6.5mg /dL and Afib RVR.  Will repeat EKG for  reference as he is now in SR.   Hyperkalemia - resolved.  Acute anemia due to retroperitoneal hematoma Hemoglobin postop 9.6 Lowest Hb 6.5 on 06/17/2023 Hb today is 8.7 Has received PRBCs  CT angiogram today shows a 8 x 2 cm retroperitoneal hematoma in the right.  AAA repair  Abdominal aortic aneurysm status post EVAR with abdominal stent graft using modified endoprosthesis, angioplasty and stenting, cutdown of the right common femoral artery, common femoral artery endarterectomy, and angiogram by Dr. Fulton Job 06/17/2023 Vascular following.   ESRD on HD: nephrology following   Confusion - appears to be improving (I am seeing him for the first time on rounds)     Called his wife over the phone to discuss his care w/ regards to Afib. All questions and concerns addressed.   For questions or updates, please contact [facility] Please consult [email or web address] for contact info under Cardiology/STEMI.   Total time spent 55 minutes  Reviewed cardiology consult note, Labs, EKG, prescription drug management, call his wife over the phone and spoke to RN during rounds, CT results reviewed, etc.     Signed, Dazha Kempa, DO  06/22/2023, 9:33 AM

## 2023-06-22 NOTE — Discharge Instructions (Signed)

## 2023-06-22 NOTE — Progress Notes (Signed)
  [facility] Progress Note   Subjective:  Seen in room this morning. Doing well this am. No events overnight. No cp, sob. On room air.    Objective Vitals:   06/21/23 2305 06/22/23 0308 06/22/23 0905 06/22/23 1126  BP: 135/85 (!) 125/99 (!) 149/93 (!) 144/69  Pulse:  87 83 79  Resp: 20 19 19 15   Temp: 98.1 F (36.7 C) 98 F (36.7 C) 98 F (36.7 C) 98 F (36.7 C)  TempSrc: Oral Oral Oral Oral  SpO2: 98% 97% 93% 96%  Weight:  99.9 kg    Height:          Additional Objective Labs: Basic Metabolic Panel: Recent Labs  Lab 06/19/23 1007 06/20/23 0358 06/21/23 0815  NA 133* 132* 131*  K 4.8 4.3 4.3  CL 95* 94* 93*  CO2 22 21* 20*  GLUCOSE 111* 108* 104*  BUN 35* 45* 61*  CREATININE 6.38* 7.86* 9.71*  CALCIUM  8.1* 8.3* 8.3*  PHOS 7.8* 8.1* 8.1*   CBC: Recent Labs  Lab 06/18/23 0331 06/18/23 1832 06/19/23 1007 06/20/23 0941 06/22/23 0404  WBC 14.4* 11.0* 12.9* 16.9* 9.3  HGB 9.5* 7.4* 9.4* 8.7* 8.7*  HCT 28.8* 22.4* 29.1* 26.6* 26.4*  MCV 88.1 89.2 87.9 86.1 86.3  PLT 157 123* 118* 108* 138*   Blood Culture   Physical Exam General: Alert, lying in bed, nad Heart: RRR Lungs: Clear bilaterally  Abdomen: soft non-tender Extremities: No LE edema  Dialysis Access: L AVF +bruit   Medications:  sodium chloride      magnesium  sulfate bolus IVPB      sodium chloride    Intravenous Once   amiodarone   200 mg Oral BID   Followed by   Cecily Cohen ON 06/29/2023] amiodarone   200 mg Oral Daily   aspirin  EC  81 mg Oral Daily   atorvastatin   80 mg Oral q AM   calcium  acetate  1,334 mg Oral TID WC   Chlorhexidine  Gluconate Cloth  6 each Topical Q0600   clopidogrel   75 mg Oral Daily   docusate sodium   100 mg Oral Daily   heparin   5,000 Units Subcutaneous Q8H   melatonin  3 mg Oral QHS   metoprolol  succinate  25 mg Oral Daily   mirtazapine   15 mg Oral QHS   pantoprazole   40 mg Oral q AM   sodium chloride  flush  3 mL Intravenous Q12H   tamsulosin   0.4  mg Oral QPC supper    Assessment/Plan: 83 y.o. male with ESRD on HD, CAD s/p CABG. HTN, HFrEF, GERD, PAD currently admitted after planned EVAR  by Dr. Fulton Job and nephrology is consulted for comanagement of ESRD and assoc conditions.   AAA s/p EVAR 5/8 per VVS.  Retroperitoneal hematoma. Found on CT . Follow H/H.  ESRD - Biweekly HD M/F at [facility]. Had HD x2 this week. No urgent indication for dialysis today. Plan HD 06/22/23. No heparin  with HD.  Anemia of ESRD + ABLA. Serial H/H. Transfusion threshold < 8. S/p prbcs on 5/9.  Received Aranesp 60 mcg on 5/10.  HTN/Volume - BP stable. UF 2-3L with HD Mon MBD.   Ca acceptable. Phos above goal. Continue home binder Ca Acetate.  Afib w RVR - Cardiology consulted. Amiodarone  started.  Delirium/confusion - Able to answer questions appropriately. No mittens or Recruitment consultant.     Hersey Lorenzo, NP  [facility] 06/22/2023,11:55 AM

## 2023-06-22 NOTE — Progress Notes (Signed)
 OT Cancellation Note  Patient Details Name: Ronald [name] MRN: [id number] DOB: [birth year]   Cancelled Treatment:    Reason Eval/Treat Not Completed: Patient at procedure or test/ unavailable (HD)  Jonette Nestle 06/22/2023, 1:32 PM Avanell Leigh, OTR/L Acute Rehabilitation Services Office: [phone]

## 2023-06-22 NOTE — Progress Notes (Signed)
   06/22/23 1551  Vitals  Temp 98 F (36.7 C)  Pulse Rate 98  Resp 16  BP (!) 152/90  SpO2 100 %  O2 Device Room Air  Weight 97.5 kg  Oxygen Therapy  Patient Activity (if Appropriate) In bed  Pulse Oximetry Type Continuous  Oximetry Probe Site Changed No  Post Treatment  Dialyzer Clearance Other (Comment) (Pt dialysis stopped due to pt confusion and pt pulling out arterial and venous needles during dialysis. Notified MD-[place]her[place] and NP-Dickinson. Pt will be dialyzed in AM. Pt unable to be re-directed, pt unwilling to lay back in bed to continue.)  Hemodialysis Intake (mL) 0 mL  Liters Processed 33.8  Fluid Removed (mL) 0.6 mL  Tolerated HD Treatment No (Comment)  AVG/AVF Arterial Site Held (minutes) 5 minutes  AVG/AVF Venous Site Held (minutes) 5 minutes   Received patient in bed to unit.  Alert and oriented.  Informed consent signed and in chart.   TX duration: One hour and twenty four minutes.  Patient dialysis discontinued and will be attempted tomorrow morning due to pulling needles out during treatment. Pt alert to self, disoriented to time, disoriented to situation, disoriented to time. Pt unable to be re-directed, nor sit back in bed to be transported to room when transportation was called. Security called to help ensure patient safely transported back to room. Pt complied with safety instructions given by this nurse and security guards. This nurse gave report to patients floor nurse. Pt wife in patient's hospital room when returned to floor.  Access used: Left lower arm fistula.  Access issues: Hemostasis achieved after needles pulled by patient during dialysis with gauze dressing. Bruit and thrill present post treatment.

## 2023-06-22 NOTE — Care Management Important Message (Signed)
 Important Message  Patient Details  Name: Ronald [name] MRN: [id number] Date of Birth: 1940-10-08   Important Message Given:  Yes - Medicare IM     Janith Melnick 06/22/2023, 11:18 AM

## 2023-06-22 NOTE — Progress Notes (Signed)
 Mobility Specialist Progress Note;   06/22/23 0947  Mobility  Activity Ambulated with assistance in hallway  Level of Assistance Minimal assist, patient does 75% or more  Assistive Device Front wheel walker  Distance Ambulated (ft) 175 ft  Activity Response Tolerated fair  Mobility Referral Yes  Mobility visit 1 Mobility  Mobility Specialist Start Time (ACUTE ONLY) 0947  Mobility Specialist Stop Time (ACUTE ONLY) 0957  Mobility Specialist Time Calculation (min) (ACUTE ONLY) 10 min   Pt agreeable to mobility. Required no physical assistance for bed mobility, however MinA throughout ambulation for safety. Heavy VC required for RW proximity and to navigate objects in room and hallway in a safe manner. HR up to 102 bpm w/ activity. Pt returned back to bed with all needs met, alarm on.   Janit Meline Mobility Specialist Please contact via SecureChat or [employer] [phone]

## 2023-06-22 NOTE — Progress Notes (Signed)
 [facility] CONSULT PROGRESS NOTE    Progress Note  Ronald [name]  WUJ:[id number] DOB: [birth year] DOA: 06/17/2023 PCP: Omie Bickers, MD     Brief Narrative:   Ronald [name] is an 83 y.o. male past medical history of end-stage renal disease on hemodialysis, dementia, essential hypertension, CAD with a history of CABG, chronic atrial fibrillation not on anticoagulation due to high risk of falls presented for an elective repair of her AAA, patient underwent a complex AAA repair on 06/17/2023, postop course was complicated by hyperkalemia acute anemia delirium and elevated troponins.  Assessment/Plan:   AAA (abdominal aortic aneurysm) (HCC) status post endovascular repair of aneurysm with stent, angioplasty, cutdown of the right common femoral artery and common femoral artery endarterectomy and angiogram by Dr. Fulton Job on 06/17/2023: Postop care per vascular surgery. Continue aspirin  and Plavix  per vascular surgery. Further management per vascular surgery, continue Lipitor. Anticoagulation for A-fib deferred to vascular surgery and cardiology. Will go ahead and sign off.  Mild dementia/delirium/acute confusional state Now resolved. Open the windows  daily, close them at night,  to help minimize confusion.   Minimize disturbances specially at night, get his glasses and hearing aid from home. PT OT evaluation is pending. Stop Ambien , avoid  benzodiazepines and narcotics. On mirtazapine . Continue on melatonin at night.  Acute blood loss anemia in the setting of acute retroperitoneal hematoma:  Hemoglobin drop from 9.6-7.6 on 06/19/2023 he was transfused 1 unit of packed red blood cells. CT angio of the chest abdomen and pelvis show 8 x 2 cm retroperitoneal hematoma in the right.  No active extravasation. Transfusion threshold is 8. Defer aspirin  and Plavix  to vascular surgery. Hemoglobin this morning is 8.7. Try to keep hemoglobin greater than 8. Cardiology and vascular  surgery to dictate when to start anticoagulation. Will go ahead and sign off.  End-stage renal disease on hemodialysis/hyperkalemia/hyperphosphatemia: Renal on board further management per renal.  Coronary artery disease/elevated troponins: Denies any chest pain but new ST segment depressions in twelve-lead EKG. Cardiology was consulted, further management per cardiology  Essential hypertension/Chronic congestive heart failure (HCC) Holding amlodipine  and Coreg , last 2D echo showed an EF of 40% with moderate aortic stenosis, grade 2 diastolic dysfunction. Appears euvolemic.  Paroxysmal atrial fibrillation: Not on anticoagulation due to his significant history of falls. Cardiology was consulted started on amiodarone  infusion and heart rate has remained less than 95. Further management per cardiology. Retroperitoneal bleed on aspirin  and Plavix , will need to decide when and if we should start anticoagulation.  Will defer to vascular and cardiology.  BPH: Continue Flomax .  Mild hyponatremia: Remains stable 133-132 further management per renal  DVT prophylaxis: heparin  Family Communication:none Status is: Inpatient Remains inpatient appropriate because: AAA repair    Code Status:     Code Status Orders  (From admission, onward)           Start     Ordered   06/17/23 1646  Full code  Continuous       Question:  By:  Answer:  Other   06/17/23 1645           Code Status History     Date Active Date Inactive Code Status Order ID Comments User Context   11/13/2022 0759 11/17/2022 1926 Do not attempt resuscitation (DNR) PRE-ARREST INTERVENTIONS DESIRED [id number]  Ronald Fillers, MD Inpatient   11/12/2022 1841 11/13/2022 0759 Full Code [id number]  Ronald Bonine, MD ED         IV Access:  Peripheral IV   Procedures and diagnostic studies:   No results found.    Medical Consultants:   None.   Subjective:    Ronald [name] no  complaint  Objective:    Vitals:   06/21/23 1920 06/21/23 1921 06/21/23 2305 06/22/23 0308  BP: (!) 102/59 128/63 135/85 (!) 125/99  Pulse:    87  Resp: 18 19 20 19   Temp: 98.1 F (36.7 C)  98.1 F (36.7 C) 98 F (36.7 C)  TempSrc: Oral  Oral Oral  SpO2: 98% 98% 98% 97%  Weight:    99.9 kg  Height:       SpO2: 97 %   Intake/Output Summary (Last 24 hours) at 06/22/2023 0826 Last data filed at 06/21/2023 1415 Gross per 24 hour  Intake 240 ml  Output --  Net 240 ml   Filed Weights   06/18/23 1309 06/21/23 0413 06/22/23 0308  Weight: 94 kg 98.2 kg 99.9 kg    Exam: General exam: In no acute distress. Respiratory system: Good air movement and clear to auscultation. Cardiovascular system: S1 & S2 heard, RRR. No JVD. Gastrointestinal system: Abdomen is nondistended, soft and nontender.  Extremities: No pedal edema. Skin: No rashes, lesions or ulcers Psychiatry: Judgement and insight appear normal. Mood & affect appropriate. Data Reviewed:    Labs: Basic Metabolic Panel: Recent Labs  Lab 06/17/23 1532 06/17/23 1839 06/18/23 0331 06/18/23 0333 06/18/23 1832 06/19/23 1007 06/20/23 0358 06/21/23 0815  NA 134* 133*   < > 133* 134* 133* 132* 131*  K 5.1 5.4*   < > 6.6* 4.1 4.8 4.3 4.3  CL 102 103   < > 103 95* 95* 94* 93*  CO2 18* 18*   < > 19* 24 22 21* 20*  GLUCOSE 241* 156*   < > 142* 112* 111* 108* 104*  BUN 36* 39*   < > 42* 25* 35* 45* 61*  CREATININE 6.90* 7.14*   < > 7.65* 5.01* 6.38* 7.86* 9.71*  CALCIUM  7.4* 7.5*   < > 8.0* 8.0* 8.1* 8.3* 8.3*  MG  --  1.8  --   --   --   --   --   --   PHOS 6.1*  --   --  7.3*  --  7.8* 8.1* 8.1*   < > = values in this interval not displayed.   GFR Estimated Creatinine Clearance: 7.5 mL/min (A) (by C-G formula based on SCr of 9.71 mg/dL (H)). Liver Function Tests: Recent Labs  Lab 06/17/23 1532 06/18/23 0333 06/19/23 1007 06/20/23 0358 06/21/23 0815  ALBUMIN  2.6* 3.2* 2.9* 2.9* 2.5*   No results for  input(s): "LIPASE", "AMYLASE" in the last 168 hours. No results for input(s): "AMMONIA" in the last 168 hours. Coagulation profile Recent Labs  Lab 06/17/23 1839  INR 1.4*   COVID-19 Labs  No results for input(s): "DDIMER", "FERRITIN", "LDH", "CRP" in the last 72 hours.  Lab Results  Component Value Date   SARSCOV2NAA Not Detected 07/30/2020    CBC: Recent Labs  Lab 06/18/23 0331 06/18/23 1832 06/19/23 1007 06/20/23 0941 06/22/23 0404  WBC 14.4* 11.0* 12.9* 16.9* 9.3  HGB 9.5* 7.4* 9.4* 8.7* 8.7*  HCT 28.8* 22.4* 29.1* 26.6* 26.4*  MCV 88.1 89.2 87.9 86.1 86.3  PLT 157 123* 118* 108* 138*   Cardiac Enzymes: No results for input(s): "CKTOTAL", "CKMB", "CKMBINDEX", "TROPONINI" in the last 168 hours. BNP (last 3 results) No results for input(s): "PROBNP" in the last 8760 hours. CBG:  Recent Labs  Lab 06/19/23 1933  GLUCAP 113*   D-Dimer: No results for input(s): "DDIMER" in the last 72 hours. Hgb A1c: No results for input(s): "HGBA1C" in the last 72 hours. Lipid Profile: No results for input(s): "CHOL", "HDL", "LDLCALC", "TRIG", "CHOLHDL", "LDLDIRECT" in the last 72 hours. Thyroid  function studies: Recent Labs    06/19/23 1007  TSH 3.984   Anemia work up: No results for input(s): "VITAMINB12", "FOLATE", "FERRITIN", "TIBC", "IRON", "RETICCTPCT" in the last 72 hours. Sepsis Labs: Recent Labs  Lab 06/18/23 1832 06/19/23 1007 06/20/23 0941 06/22/23 0404  WBC 11.0* 12.9* 16.9* 9.3   Microbiology No results found for this or any previous visit (from the past 240 hours).    Medications:    sodium chloride    Intravenous Once   amiodarone   200 mg Oral BID   aspirin  EC  81 mg Oral Daily   atorvastatin   80 mg Oral q AM   calcium  acetate  1,334 mg Oral TID WC   Chlorhexidine  Gluconate Cloth  6 each Topical Q0600   clopidogrel   75 mg Oral Daily   docusate sodium   100 mg Oral Daily   heparin   5,000 Units Subcutaneous Q8H   melatonin  3 mg Oral QHS    mirtazapine   15 mg Oral QHS   pantoprazole   40 mg Oral q AM   sodium chloride  flush  3 mL Intravenous Q12H   tamsulosin   0.4 mg Oral QPC supper   Continuous Infusions:  sodium chloride      magnesium  sulfate bolus IVPB        LOS: 5 days   Macdonald Savoy  [facility]  06/22/2023, 8:26 AM

## 2023-06-23 DIAGNOSIS — I5022 Chronic systolic (congestive) heart failure: Secondary | ICD-10-CM | POA: Diagnosis not present

## 2023-06-23 DIAGNOSIS — I350 Nonrheumatic aortic (valve) stenosis: Secondary | ICD-10-CM | POA: Diagnosis not present

## 2023-06-23 DIAGNOSIS — I480 Paroxysmal atrial fibrillation: Secondary | ICD-10-CM | POA: Diagnosis not present

## 2023-06-23 DIAGNOSIS — I714 Abdominal aortic aneurysm, without rupture, unspecified: Secondary | ICD-10-CM | POA: Diagnosis not present

## 2023-06-23 LAB — CBC
HCT: 26.5 % — ABNORMAL LOW (ref 39.0–52.0)
Hemoglobin: 8.7 g/dL — ABNORMAL LOW (ref 13.0–17.0)
MCH: 28.6 pg (ref 26.0–34.0)
MCHC: 32.8 g/dL (ref 30.0–36.0)
MCV: 87.2 fL (ref 80.0–100.0)
Platelets: 174 10*3/uL (ref 150–400)
RBC: 3.04 MIL/uL — ABNORMAL LOW (ref 4.22–5.81)
RDW: 18.5 % — ABNORMAL HIGH (ref 11.5–15.5)
WBC: 7.1 10*3/uL (ref 4.0–10.5)
nRBC: 0 % (ref 0.0–0.2)

## 2023-06-23 LAB — RENAL FUNCTION PANEL
Albumin: 2.3 g/dL — ABNORMAL LOW (ref 3.5–5.0)
Anion gap: 18 — ABNORMAL HIGH (ref 5–15)
BUN: 62 mg/dL — ABNORMAL HIGH (ref 8–23)
CO2: 22 mmol/L (ref 22–32)
Calcium: 8.2 mg/dL — ABNORMAL LOW (ref 8.9–10.3)
Chloride: 93 mmol/L — ABNORMAL LOW (ref 98–111)
Creatinine, Ser: 10.66 mg/dL — ABNORMAL HIGH (ref 0.61–1.24)
GFR, Estimated: 4 mL/min — ABNORMAL LOW (ref >60)
Glucose, Bld: 93 mg/dL (ref 70–99)
Phosphorus: 6.9 mg/dL — ABNORMAL HIGH (ref 2.5–4.6)
Potassium: 4 mmol/L (ref 3.5–5.1)
Sodium: 133 mmol/L — ABNORMAL LOW (ref 135–145)

## 2023-06-23 MED ORDER — METOPROLOL SUCCINATE ER 50 MG PO TB24: 50.0000 mg | ORAL_TABLET | Freq: Every day | ORAL | 11 refills | Status: DC

## 2023-06-23 MED ORDER — CLOPIDOGREL BISULFATE 75 MG PO TABS: 75.0000 mg | ORAL_TABLET | Freq: Every day | ORAL | 11 refills | Status: DC

## 2023-06-23 MED ORDER — METOPROLOL SUCCINATE ER 25 MG PO TB24: 25.0000 mg | ORAL_TABLET | Freq: Every day | ORAL | 1 refills | Status: DC

## 2023-06-23 MED ORDER — AMIODARONE HCL 200 MG PO TABS: ORAL_TABLET | ORAL | 0 refills | Status: DC

## 2023-06-23 NOTE — Progress Notes (Signed)
 [facility]  Subjective  -no complaints   Objective 131/66 75 98 F (36.7 C) (Oral) 16 95%  Intake/Output Summary (Last 24 hours) at 06/23/2023 0841 Last data filed at 06/22/2023 1919 Gross per 24 hour  Intake 100 ml  Output 0.6 ml  Net 99.4 ml    Right groin incision without hematoma Brisk PT signals bilateral lower extremities  Laboratory Lab Results: Recent Labs    06/20/23 0941 06/22/23 0404  WBC 16.9* 9.3  HGB 8.7* 8.7*  HCT 26.6* 26.4*  PLT 108* 138*   BMET Recent Labs    06/21/23 0815  NA 131*  K 4.3  CL 93*  CO2 20*  GLUCOSE 104*  BUN 61*  CREATININE 9.71*  CALCIUM  8.3*    COAG Lab Results  Component Value Date   INR 1.4 (H) 06/17/2023   INR 1.1 06/10/2023   No results found for: "PTT"  Assessment/Planning:  Status post physician modified endograft for treatment of large 13 cm abdominal aortic aneurysm with scallop for the SMA in the setting of prior remote bifurcated stent graft.  Planning for discharge yesterday but this was delayed given he pulled out his needle in dialysis and needed additional dialysis today for ESRD.  Hoping for discharge today.  Follow-up in 1 month with CTA.  Will plan aspirin  statin Plavix  at discharge. I do not favor full anticoagulation for his chronic A-fib given his fall risk in addition to his retroperitoneal bleed. He has had known A-fib in the past and not been on anticoagulation before.   Ronald [name] 06/23/2023 8:41 AM --

## 2023-06-23 NOTE — Progress Notes (Signed)
 [facility] Progress Note   Subjective:  Seen on HD unit this morning. More lucid that yesterday. Patient pulled out both his HD cannulas yesterday, and would not allow staff to proceed with HD. He was very confused where he was at, stating that he demands to go home. I was unable to reorient him yesterday, and HD was terminated. He received 1.5 hours of treatment.    Objective Vitals:   06/23/23 1100 06/23/23 1120 06/23/23 1125 06/23/23 1131  BP: (!) 147/63 (!) 146/69 (!) 142/64   Pulse: 65 67 69   Resp: 14 12 17    Temp:  98.1 F (36.7 C)    TempSrc:      SpO2: 100% 98% 97%   Weight:    97.2 kg  Height:          Additional Objective Labs: Basic Metabolic Panel: Recent Labs  Lab 06/20/23 0358 06/21/23 0815 06/23/23 0922  NA 132* 131* 133*  K 4.3 4.3 4.0  CL 94* 93* 93*  CO2 21* 20* 22  GLUCOSE 108* 104* 93  BUN 45* 61* 62*  CREATININE 7.86* 9.71* 10.66*  CALCIUM  8.3* 8.3* 8.2*  PHOS 8.1* 8.1* 6.9*   CBC: Recent Labs  Lab 06/18/23 1832 06/19/23 1007 06/20/23 0941 06/22/23 0404 06/23/23 0901  WBC 11.0* 12.9* 16.9* 9.3 7.1  HGB 7.4* 9.4* 8.7* 8.7* 8.7*  HCT 22.4* 29.1* 26.6* 26.4* 26.5*  MCV 89.2 87.9 86.1 86.3 87.2  PLT 123* 118* 108* 138* 174   Blood Culture   Physical Exam General: Alert, lying in bed, nad Heart: RRR Lungs: Clear bilaterally  Abdomen: soft non-tender Extremities: No LE edema  Dialysis Access: L AVF +bruit   Medications:  sodium chloride      magnesium  sulfate bolus IVPB      sodium chloride    Intravenous Once   amiodarone   200 mg Oral BID   aspirin  EC  81 mg Oral Daily   atorvastatin   80 mg Oral q AM   calcium  acetate  1,334 mg Oral TID WC   Chlorhexidine  Gluconate Cloth  6 each Topical Q0600   Chlorhexidine  Gluconate Cloth  6 each Topical Q0600   clopidogrel   75 mg Oral Daily   docusate sodium   100 mg Oral Daily   heparin   5,000 Units Subcutaneous Q8H   melatonin  3 mg Oral QHS   metoprolol  succinate  25  mg Oral Daily   mirtazapine   15 mg Oral QHS   pantoprazole   40 mg Oral q AM   sodium chloride  flush  3 mL Intravenous Q12H   tamsulosin   0.4 mg Oral QPC supper    Assessment/Plan: 83 y.o. male with ESRD on HD, CAD s/p CABG. HTN, HFrEF, GERD, PAD currently admitted after planned EVAR  by Dr. Fulton Job and nephrology is consulted for comanagement of ESRD and assoc conditions.   AAA s/p EVAR 5/8 per [facility].  Retroperitoneal hematoma. Found on CT . Follow H/H.  ESRD - Biweekly HD M/F at [facility]. Had HD x2 this week. No urgent indication for dialysis today. Plan HD 06/23/23. No heparin  with HD.  Anemia of ESRD + ABLA. Serial H/H. Transfusion threshold < 8. S/p prbcs on 5/9.  Received Aranesp 60 mcg on 5/10.  HTN/Volume - BP stable. UF 2-3L with HD Mon MBD.   Ca acceptable. Phos above goal. Continue home binder Ca Acetate.  Afib w RVR - Cardiology consulted. Amiodarone  started.  Delirium/confusion - Able to answer questions appropriately. No mittens or Recruitment consultant.  Hersey Lorenzo, NP  [facility] 06/23/2023,11:42 AM

## 2023-06-23 NOTE — Progress Notes (Signed)
 D/C order noted. Contacted [facility] to be advised of pt's d/c today and that pt should resume care on Friday. Will fax d/c summary and renal note once available for continuation of care.   Lauraine Polite Renal Navigator [phone]  Addendum Jun 24, 2023: Vascular progress note and renal note from yesterday faxed to clinic this morning for continuation of care. D/C summary not available at this time.

## 2023-06-23 NOTE — Progress Notes (Signed)
 Progress Note  Patient Name: Ronald [name] Date of Encounter: 06/23/2023  Primary Cardiologist: Janelle Mediate, MD   Subjective   Patient seen in dialysis. Denies anginal chest pain. Resting in bed comfortably.  Inpatient Medications    Scheduled Meds:  sodium chloride    Intravenous Once   amiodarone   200 mg Oral BID   aspirin  EC  81 mg Oral Daily   atorvastatin   80 mg Oral q AM   calcium  acetate  1,334 mg Oral TID WC   Chlorhexidine  Gluconate Cloth  6 each Topical Q0600   Chlorhexidine  Gluconate Cloth  6 each Topical Q0600   clopidogrel   75 mg Oral Daily   docusate sodium   100 mg Oral Daily   heparin   5,000 Units Subcutaneous Q8H   melatonin  3 mg Oral QHS   metoprolol  succinate  25 mg Oral Daily   mirtazapine   15 mg Oral QHS   pantoprazole   40 mg Oral q AM   sodium chloride  flush  3 mL Intravenous Q12H   tamsulosin   0.4 mg Oral QPC supper   Continuous Infusions:  sodium chloride      magnesium  sulfate bolus IVPB     PRN Meds: sodium chloride , acetaminophen  **OR** acetaminophen , bisacodyl , guaiFENesin -dextromethorphan, HYDROmorphone  (DILAUDID ) injection, magnesium  sulfate bolus IVPB, metoprolol  tartrate, ondansetron , oxyCODONE , phenol, senna-docusate, sodium chloride  flush   Vital Signs    Vitals:   06/23/23 0755 06/23/23 0910 06/23/23 0915 06/23/23 0930  BP: 131/66 (!) 166/69 (!) 166/73 (!) 163/70  Pulse: 75 82 80 71  Resp: 16 (!) 21 16 19   Temp: 98 F (36.7 C) 98.3 F (36.8 C)    TempSrc: Oral     SpO2:  98% 97% 98%  Weight:  96.4 kg    Height:        Intake/Output Summary (Last 24 hours) at 06/23/2023 1003 Last data filed at 06/22/2023 1919 Gross per 24 hour  Intake 100 ml  Output 0.6 ml  Net 99.4 ml   Filed Weights   06/22/23 1551 06/23/23 0537 06/23/23 0910  Weight: 97.5 kg 94.8 kg 96.4 kg    Telemetry    Normal sinus rhythm- Personally Reviewed  ECG & studies    EKG: 06/22/2023 NSR 79bpm LBBB LAD  - Personally Reviewed  Echo:   11/2022  1. Left ventricular ejection fraction, by estimation, is 40 to 45%. The left ventricle has mildly decreased function. The left ventricle demonstrates global hypokinesis. There is mild left ventricular hypertrophy. Left ventricular diastolic parameters are consistent with Grade II diastolic dysfunction (pseudonormalization).  Elevated left atrial pressure.   2. Right ventricular systolic function is normal. The right ventricular size is normal. Tricuspid regurgitation signal is inadequate for assessing PA pressure.   3. Left atrial size was severely dilated.   4. Right atrial size was mildly dilated.   5. The mitral valve is abnormal. Mild mitral valve regurgitation. No  evidence of mitral stenosis.   6. The tricuspid valve is abnormal.   7. The aortic valve has an indeterminant number of cusps. There is moderate calcification of the aortic valve. There is moderate thickening of the aortic valve. Aortic valve regurgitation is not visualized. Moderate aortic valve stenosis. Aortic valve mean gradient measures 18.8 mmHg. Aortic valve peak gradient measures 44.4 mmHg. Aortic valve area, by VTI measures 1.10 cm. DI 0.29  8. The inferior vena cava is dilated in size with <50% respiratory  variability, suggesting right atrial pressure of 15 mmHg.   Physical Exam   GEN:  No acute distress.   Neck: No JVD Cardiac: RRR, no murmurs, rubs, or gallops.  Respiratory: Clear to auscultation bilaterally no w/ r/ /r  GI: Soft, nontender, non-distended  MS: No edema; No deformity. Ecchymoses bilateral arms/hand. HD access noted.  Neuro:  Nonfocal. Psych: Normal affect, cooperative, conversing well.   Labs    Chemistry Recent Labs  Lab 06/19/23 1007 06/20/23 0358 06/21/23 0815  NA 133* 132* 131*  K 4.8 4.3 4.3  CL 95* 94* 93*  CO2 22 21* 20*  GLUCOSE 111* 108* 104*  BUN 35* 45* 61*  CREATININE 6.38* 7.86* 9.71*  CALCIUM  8.1* 8.3* 8.3*  ALBUMIN  2.9* 2.9* 2.5*  GFRNONAA 8* 6* 5*   ANIONGAP 16* 17* 18*     Hematology Recent Labs  Lab 06/19/23 1007 06/20/23 0941 06/22/23 0404  WBC 12.9* 16.9* 9.3  RBC 3.31* 3.09* 3.06*  HGB 9.4* 8.7* 8.7*  HCT 29.1* 26.6* 26.4*  MCV 87.9 86.1 86.3  MCH 28.4 28.2 28.4  MCHC 32.3 32.7 33.0  RDW 18.8* 18.7* 18.4*  PLT 118* 108* 138*    Cardiac EnzymesNo results for input(s): "TROPONINI" in the last 168 hours. No results for input(s): "TROPIPOC" in the last 168 hours.   BNPNo results for input(s): "BNP", "PROBNP" in the last 168 hours.   DDimer No results for input(s): "DDIMER" in the last 168 hours.   Radiology    CTA Chest Abd and Pelvis:  06/19/2023 1. Postsurgical changes from revision aortic endograft. There is no overt evidence of endoleak. A retroperitoneal hematoma is present which is detailed above. The right renal artery is not opacified. 2. Aortic aneurysm measures 13.2 x 11.1 cm, previously 12.8 x 11 cm. 3. The cranial aspect of the abdominal aneurysm sac is abnormal in appearance. Differential considerations include morphologically irregular aneurysm sac versus retroperitoneal lymphadenopathy. Consideration can be given toward outpatient evaluation by PET-CT for problem solving. 4. Small foci of bibasilar airspace consolidation with trace right pleural effusion.   Cardiac Studies   none  Patient Profile     83 y.o. male admitted with AAA leak, afib with RVR, CAD and LV dysfunction on ESRD on HD  Assessment & Plan    Paroxysmal Afib & Afib with RVR   Currently in sinus rhythm. Per EMR dx w/ Afib back in 07/2020 and in 11/2022 [facility] was stopped due to patient falling.  Per EMR not on OAC due to dementia, fall risk. Also he has had a RP hematoma , anemia s/p PRBCs. Vascular team so favors against OAC (see note 06/23/2023) [id number] 6 Currently on Amiodarone  200 mg po bid - will continue for 7 days and than stop.  Would like to avoid long term antiarrhythmic medications as he is not on  anticoagulation.  Long-term would recommend AV nodal for rate control as he is not on OAC.  Tolerated Toprol -XL 25 mg p.o. daily well. Will increase Toprol -XL to 50 mg p.o. daily Likely not an ideal candidate of left atrial appendage occlusion device for thromboembolic prophylaxis given his co-morbidities.  But could be discussed primary cardiologist as outpatient.  HFmrEF Volume management per HD.  GDMT limited due to ESRD on HD  Home meds: norvasc  5mg  qday, Coreg  25mg  po bid.  Increase Toprol -XL as discussed above If BP allow would recommend low dose hydralazine  and isordil.   Aortic stenosis:  Moderate as per echo in October 2024 Currently asymptomatic.   CAD w/ hx of CABG w/ elevated hstroponin:  Last EKG on 06/18/2023 noted Afib  w/ RVR with diffuse ST-T changes  Troponin leak likely demand in the setting of severe anemia of 6.5mg /dL and Afib RVR.  Repeat EKG illustrates sinus rhythm without ST-T changes.  Clinically denies anginal chest pain  Hyperkalemia - resolved.  Acute anemia due to retroperitoneal hematoma Hemoglobin postop 9.6 Lowest Hb 6.5 on 06/17/2023 Hb 06/22/23 is 8.7 Has received PRBCs  CT angiogram today shows a 8 x 2 cm retroperitoneal hematoma in the right.  AAA repair  Abdominal aortic aneurysm status post EVAR with abdominal stent graft using modified endoprosthesis, angioplasty and stenting, cutdown of the right common femoral artery, common femoral artery endarterectomy, and angiogram by Dr. Fulton Job 06/17/2023 Vascular following.   ESRD on HD: nephrology following   Confusion - Improved. More alert compared to yesterday.     Called his wife over the phone to discuss his care w/ regards to Afib. All questions and concerns addressed.   For questions or updates, please contact [facility] Please consult [email or web address] for contact info under Cardiology/STEMI.   Total time spent 37 minutes.     Signed, Kimari Lienhard Albert Huff, DO  06/23/2023, 10:03 AM

## 2023-06-23 NOTE — Progress Notes (Signed)
 OT Cancellation Note  Patient Details Name: Ronald [name] MRN: [id number] DOB: Oct 09, 1940   Cancelled Treatment:    Reason Eval/Treat Not Completed: Patient declined, no reason specified. Pt's sitter, OT and PTA gave max encouragement for pt to participate, however pt continued to politely decline; RN notified. OT will follow up as available  Alfred Ann 06/23/2023, 1:28 PM

## 2023-06-23 NOTE — Progress Notes (Signed)
 OT Cancellation Note  Patient Details Name: Ronald [name] MRN: [id number] DOB: 1940/04/21   Cancelled Treatment:    Reason Eval/Treat Not Completed: Patient at procedure or test/ unavailable. Pt at HD, OT will follow up next available time  Alfred Ann 06/23/2023, 9:49 AM

## 2023-06-23 NOTE — Progress Notes (Signed)
 Discharge instructions given to the patient's wife at bedside, patient was calm and pleasant, PIV removed, CCMD notified, all the belongings taken by the family.

## 2023-06-23 NOTE — Op Note (Signed)
 Date: Jun 17, 2023   Preoperative diagnosis: 13 cm abdominal aortic aneurysm   Postoperative diagnosis same   Procedure: 1.  Ultrasound-guided access right common femoral artery with delivery of Perclose closure device 2.  Ultrasound-guided access of left common femoral artery 3.  Abdominal aortogram with catheter selection of abdominal aorta and catheter selection SMA 4.  Abdominal aortic stent graft using a physician modified endoprosthesis (Gore 45 mm x 10 cm thoracic endograft with back table modification creating scallop for SMA) 5.  Angioplasty and stent of right external iliac artery (10 mm x 60 mm epic stent post-dilated with a 9 mm Mustang) 6.  Cutdown on the right common femoral artery for open common femoral exposure 7.  Right common femoral endarterectomy including endarterectomy of the proximal SFA and profunda 8.  Right lower extremity angiogram 9.  Endarterectomy of the proximal SFA with bovine patch angioplasty   Surgeon: Dr. Young Hensen, MD   Co-surgeon: Dr. Delaney Fearing, MD   Assistant: Rocky Cipro, [place]   Indications: Patient is an 83 year old male that previously underwent AAA repair in [place]  for 6.4 cm AAA in 2013 with aortobiiliac stent graft.  He now has evidence of a type Ia endoleak with no proximal seal.  His abdominal aortic aneurysm now measures 13 cm.  He presents for stent graft repair using physician modified endoprosthesis with back table after risks benefits discussed.   Co-surgeon was needed given the complexity of the case including back table physician modification of the endograft as well as difficult iliac access and right common femoral endarterectomy with cutdown at completion.   Findings: Initially partially deployed the endograft on the back table and created an SMA scallop on the Gore thoracic device and this was re-sheathed.  Ultrasound-guided access of right common femoral artery with Perclose closure for the main body 24  French sheath.  Also had ultrasound-guided access of the left common femoral artery for delivery of 5 French sheath.  We had trouble getting our sheath to advance in the right groin due to significantly diseased iliacs.  I predilated with a 20 French dilator and then ended up having to angioplasty the right external iliac artery and distal common iliac artery with 9 mm and 10 mm Mustang.  Finally I was able to get a 24 French sheath into the previous aortobiiliac stent graft.  We then delivered our 45 mm x 10 cm thoracic endograft through the 24 French sheath in the right groin.  This was modified with a scallop for the SMA with Nester coils.  After abdominal angiogram to identify the SMA we deployed this at graft at 50% and then rotated the device.  It was difficult to see the takeoff of the SMA so we cannulated the SMA and ultimately we were able to rotate the device and deployed this at the SMA as indicated.  At completion we had to perform balloon angioplasty with a Coda balloon.  The stent was deployed into the previous existing aortobiiliac stent graft.  We covered the renal arteries given ESRD and had filling of the SMA and celiac at completion.  No evidence of type Ia endoleak and no filling of the aneurysm at completion.  We then pulled our sheath back on the right and did a retrograde sheath shot.  There was a high-grade calcified stenosis in the right external iliac artery at the hypogastric.  We stented this with a 10 mm x 60 mm epic stent and postdilated with a 9  mm Mustang.  Ultimately we then removed the sheath from the right groin and tied down the Perclose's.  Patient became hypotensive.  We were able to put a microcatheter up and retrograde sheath shot showed extravasation from the common femoral artery with failure of the Perclose.  This required open exposure of the right common femoral artery and performed cutdown on the common femoral performed extensive endarterectomy of the common femoral as  well as the proximal SFA and profunda with bovine patch angioplasty.  I also repaired the distal external iliac artery on the posterior wall.  At completion we had no signals in the right foot.  I then shot a right lower extremity angiogram with a micro sheath.  There was a high-grade calcified stenosis in the proximal SFA as well as the profunda.  I then went back and open the SFA and performed a second endarterectomy more distal and I was able to find a nice endpoint that was tacked down.  I performed a separate bovine patch on the SFA.  Ultimately patient now had brisk signals in the foot.   EBL: 1500 mL   Resuscitation: 2 units PRBCs   Anesthesia: General   Details:  Please see Dr. Spurgeon Dyer dictation for full operative details I performed the back table modification of the goal of 45 mm x 10 cm thoracic endograft.  He was partially deployed on the back table and then cautery was used to burn a SMA scallop that was approximately 15 mm x 15 mm.  This was then reinforced with a coil along the scalloped edge which would allow us  to visualize and align the scallop under fluoroscopy. I have assisted with all critical portions of the case including access, pinning wires and advancing the device.  I also assisted with the deployment and evaluating the fluoroscopic images.  I also played a critical role in the right common femoral cutdown, endarterectomy and arterial repair.  These skills could not have been adequately performed by a scrub tech assistant.       Complication: None   Condition: Stable

## 2023-06-23 NOTE — Progress Notes (Signed)
 Received patient in bed to unit.  Alert and oriented to serf Informed consent signed and in chart.   TX duration:2  Patient tolerated well.  Transported back to the room  Alert, without acute distress.  Hand-off given to patient's nurse.    06/23/23 1120  Vitals  Temp 98.1 F (36.7 C)  BP (!) 146/69  Pulse Rate 67  Resp 12  Oxygen Therapy  SpO2 98 %  O2 Device Room Air  Pulse Oximetry Type Continuous  During Treatment Monitoring  Blood Flow Rate (mL/min) 399 mL/min  Arterial Pressure (mmHg) -180.39 mmHg  Venous Pressure (mmHg) 199.59 mmHg  TMP (mmHg) 25.65 mmHg  Ultrafiltration Rate (mL/min) 1298 mL/min  Dialysate Flow Rate (mL/min) 299 ml/min  Duration of HD Treatment -hour(s) 1.95 hour(s)  Cumulative Fluid Removed (mL) per Treatment  1932.09  HD Safety Checks Performed Yes  Intra-Hemodialysis Comments Tx completed  Dialysis Fluid Bolus Normal Saline  Bolus Amount (mL) 300 mL  Post Treatment  Liters Processed 48  Fluid Removed (mL) 2000 mL  AVG/AVF Arterial Site Held (minutes) 6 minutes  AVG/AVF Venous Site Held (minutes) 6 minutes  Fistula / Graft Left Forearm Arteriovenous fistula  No placement date or time found.   Placed prior to admission: Yes  Orientation: Left  Access Location: Forearm  Access Type: Arteriovenous fistula  Site Condition No complications  Fistula / Graft Assessment Present;Thrill;Bruit  Status Deaccessed    Access used: AVG Access issues: n/a  Total UF removed: 2L Post HD weight: 97.2kg        [facility]

## 2023-06-23 NOTE — TOC Transition Note (Signed)
 Transition of Care (TOC) - Discharge Note Sherin Dingwall RN, BSN Transitions of Care Unit 4E- RN Case Manager See Treatment Team for direct phone #   Patient Details  Name: Ronald [name] MRN: [id number] Date of Birth: Mar 15, 1940  Transition of Care [facility]) CM/SW Contact:  Rox Cope, RN Phone Number: 06/23/2023, 3:02 PM   Clinical Narrative:    Pt stable for transition home, order placed for DME- RW. [facility] office made referral for [facility] needs to [facility].   In house provider [facility] to follow up with spouse for DME- RW needs as pt is currently in HD- for d/c later today. [facility] to deliver to pt on return to room from HD.  CM has updated [facility] liaison for start of care needs.   Wife to transport later today once pt returns from HD.   No further TOC needs noted at this time.    Final next level of care: Home w Home Health Services Barriers to Discharge: No Barriers Identified   Patient Goals and CMS Choice Patient states their goals for this hospitalization and ongoing recovery are:: return home   Choice offered to / list presented to : Patient      Discharge Placement               Home w/ [facility]        Discharge Plan and Services Additional resources added to the After Visit Summary for     Discharge Planning Services: CM Consult Post Acute Care Choice: Durable Medical Equipment, Home Health          DME Arranged: Walker rolling DME Agency: [facility] Date DME Agency Contacted: 06/23/23 Time DME Agency Contacted: 1100 Representative spoke with at DME Agency: Gladys Lamp HH Arranged: RN, PT HH Agency: [facility] Date HH Agency Contacted: 06/23/23 Time HH Agency Contacted: 1130 Representative spoke with at [facility] Agency: Glenora Laos  Social Drivers of Health (SDOH) Interventions SDOH Screenings   Food Insecurity: Patient Declined (06/18/2023)  Housing: Patient Declined (06/18/2023)  Transportation Needs: Patient Declined (06/18/2023)   Utilities: Patient Declined (06/18/2023)  Depression (PHQ2-9): Medium Risk (05/22/2023)  Financial Resource Strain: Low Risk  (01/07/2023)  Physical Activity: Unknown (11/10/2017)   Received from [facility], [facility]  Social Connections: Patient Declined (06/18/2023)  Stress: No Stress Concern Present (01/07/2023)  Tobacco Use: Medium Risk (06/17/2023)  Health Literacy: Adequate Health Literacy (01/07/2023)     Readmission Risk Interventions    06/23/2023    3:01 PM 11/14/2022    7:26 AM  Readmission Risk Prevention Plan  Transportation Screening Complete Complete  HRI or Home Care Consult Complete Complete  Social Work Consult for Recovery Care Planning/Counseling Complete Complete  Palliative Care Screening Not Applicable Not Applicable  Medication Review Oceanographer) Complete Complete

## 2023-06-23 NOTE — Progress Notes (Signed)
 PT Cancellation Note  Patient Details Name: Ronald [name] MRN: [id number] DOB: 15-Jan-1941   Cancelled Treatment:    Reason Eval/Treat Not Completed: (P) Patient declined, no reason specified, pt declining all mobility,  max encouragement and education provided, OT into room as well, pt continuing to decline all mobility stating "I'm just lazy". Will check back as schedule allows to continue with PT POC.  Beverly Buckler. PTA Acute Rehabilitation Services Office: [phone]    Agapito Horseman 06/23/2023, 2:54 PM

## 2023-06-24 ENCOUNTER — Other Ambulatory Visit: Payer: Self-pay

## 2023-06-24 ENCOUNTER — Ambulatory Visit: Payer: 59 | Admitting: Urology

## 2023-06-24 DIAGNOSIS — I132 Hypertensive heart and chronic kidney disease with heart failure and with stage 5 chronic kidney disease, or end stage renal disease: Secondary | ICD-10-CM | POA: Diagnosis not present

## 2023-06-24 DIAGNOSIS — I252 Old myocardial infarction: Secondary | ICD-10-CM | POA: Diagnosis not present

## 2023-06-24 DIAGNOSIS — Z556 Problems related to health literacy: Secondary | ICD-10-CM | POA: Diagnosis not present

## 2023-06-24 DIAGNOSIS — S51812D Laceration without foreign body of left forearm, subsequent encounter: Secondary | ICD-10-CM | POA: Diagnosis not present

## 2023-06-24 DIAGNOSIS — I7143 Infrarenal abdominal aortic aneurysm, without rupture: Secondary | ICD-10-CM

## 2023-06-24 DIAGNOSIS — I5022 Chronic systolic (congestive) heart failure: Secondary | ICD-10-CM | POA: Diagnosis not present

## 2023-06-24 DIAGNOSIS — I4811 Longstanding persistent atrial fibrillation: Secondary | ICD-10-CM | POA: Diagnosis not present

## 2023-06-24 DIAGNOSIS — Z992 Dependence on renal dialysis: Secondary | ICD-10-CM | POA: Diagnosis not present

## 2023-06-24 DIAGNOSIS — Z8673 Personal history of transient ischemic attack (TIA), and cerebral infarction without residual deficits: Secondary | ICD-10-CM | POA: Diagnosis not present

## 2023-06-24 DIAGNOSIS — Z7902 Long term (current) use of antithrombotics/antiplatelets: Secondary | ICD-10-CM | POA: Diagnosis not present

## 2023-06-24 DIAGNOSIS — H3530 Unspecified macular degeneration: Secondary | ICD-10-CM | POA: Diagnosis not present

## 2023-06-24 DIAGNOSIS — D631 Anemia in chronic kidney disease: Secondary | ICD-10-CM | POA: Diagnosis not present

## 2023-06-24 DIAGNOSIS — N186 End stage renal disease: Secondary | ICD-10-CM | POA: Diagnosis not present

## 2023-06-24 DIAGNOSIS — Z48812 Encounter for surgical aftercare following surgery on the circulatory system: Secondary | ICD-10-CM | POA: Diagnosis not present

## 2023-06-24 DIAGNOSIS — J449 Chronic obstructive pulmonary disease, unspecified: Secondary | ICD-10-CM | POA: Diagnosis not present

## 2023-06-24 DIAGNOSIS — H409 Unspecified glaucoma: Secondary | ICD-10-CM | POA: Diagnosis not present

## 2023-06-24 DIAGNOSIS — N2581 Secondary hyperparathyroidism of renal origin: Secondary | ICD-10-CM | POA: Diagnosis not present

## 2023-06-24 DIAGNOSIS — E782 Mixed hyperlipidemia: Secondary | ICD-10-CM | POA: Diagnosis not present

## 2023-06-24 DIAGNOSIS — Z7982 Long term (current) use of aspirin: Secondary | ICD-10-CM | POA: Diagnosis not present

## 2023-06-24 DIAGNOSIS — K219 Gastro-esophageal reflux disease without esophagitis: Secondary | ICD-10-CM | POA: Diagnosis not present

## 2023-06-24 DIAGNOSIS — I2510 Atherosclerotic heart disease of native coronary artery without angina pectoris: Secondary | ICD-10-CM | POA: Diagnosis not present

## 2023-06-24 DIAGNOSIS — G894 Chronic pain syndrome: Secondary | ICD-10-CM | POA: Diagnosis not present

## 2023-06-24 NOTE — Transitions of Care (Post Inpatient/ED Visit) (Signed)
 06/24/2023  Patient ID: Ronald [name], male   DOB: 1940/03/24, 83 y.o.   MRN: [id number]  Chart Review for transitions of care.  See [facility] for documentation.  Eleonor Ocon J. Nollan Muldrow RN, MSN [facility], [facility] RN Care Manager Direct Dial: [phone]  Fax: [phone] Website: [email or web address]

## 2023-06-25 NOTE — Transitions of Care (Post Inpatient/ED Visit) (Signed)
 06/25/2023  Patient ID: Ronald [name], male   DOB: 1940-11-19, 83 y.o.   MRN: [id number]  Medication Review for transitions of care.   Mahoganie Basher J. Halil Rentz RN, MSN [facility], [facility] RN Care Manager Direct Dial: [phone]  Fax: [phone] Website: [email or web address]

## 2023-06-26 DIAGNOSIS — J449 Chronic obstructive pulmonary disease, unspecified: Secondary | ICD-10-CM | POA: Diagnosis not present

## 2023-06-26 DIAGNOSIS — I5022 Chronic systolic (congestive) heart failure: Secondary | ICD-10-CM | POA: Diagnosis not present

## 2023-06-26 DIAGNOSIS — S51812D Laceration without foreign body of left forearm, subsequent encounter: Secondary | ICD-10-CM | POA: Diagnosis not present

## 2023-06-26 DIAGNOSIS — Z7982 Long term (current) use of aspirin: Secondary | ICD-10-CM | POA: Diagnosis not present

## 2023-06-26 DIAGNOSIS — N2581 Secondary hyperparathyroidism of renal origin: Secondary | ICD-10-CM | POA: Diagnosis not present

## 2023-06-26 DIAGNOSIS — H3530 Unspecified macular degeneration: Secondary | ICD-10-CM | POA: Diagnosis not present

## 2023-06-26 DIAGNOSIS — Z7902 Long term (current) use of antithrombotics/antiplatelets: Secondary | ICD-10-CM | POA: Diagnosis not present

## 2023-06-26 DIAGNOSIS — I2510 Atherosclerotic heart disease of native coronary artery without angina pectoris: Secondary | ICD-10-CM | POA: Diagnosis not present

## 2023-06-26 DIAGNOSIS — D631 Anemia in chronic kidney disease: Secondary | ICD-10-CM | POA: Diagnosis not present

## 2023-06-26 DIAGNOSIS — E782 Mixed hyperlipidemia: Secondary | ICD-10-CM | POA: Diagnosis not present

## 2023-06-26 DIAGNOSIS — G894 Chronic pain syndrome: Secondary | ICD-10-CM | POA: Diagnosis not present

## 2023-06-26 DIAGNOSIS — I4811 Longstanding persistent atrial fibrillation: Secondary | ICD-10-CM | POA: Diagnosis not present

## 2023-06-26 DIAGNOSIS — Z8673 Personal history of transient ischemic attack (TIA), and cerebral infarction without residual deficits: Secondary | ICD-10-CM | POA: Diagnosis not present

## 2023-06-26 DIAGNOSIS — Z992 Dependence on renal dialysis: Secondary | ICD-10-CM | POA: Diagnosis not present

## 2023-06-26 DIAGNOSIS — I252 Old myocardial infarction: Secondary | ICD-10-CM | POA: Diagnosis not present

## 2023-06-26 DIAGNOSIS — N186 End stage renal disease: Secondary | ICD-10-CM | POA: Diagnosis not present

## 2023-06-26 DIAGNOSIS — H409 Unspecified glaucoma: Secondary | ICD-10-CM | POA: Diagnosis not present

## 2023-06-26 DIAGNOSIS — K219 Gastro-esophageal reflux disease without esophagitis: Secondary | ICD-10-CM | POA: Diagnosis not present

## 2023-06-26 DIAGNOSIS — Z556 Problems related to health literacy: Secondary | ICD-10-CM | POA: Diagnosis not present

## 2023-06-26 DIAGNOSIS — Z48812 Encounter for surgical aftercare following surgery on the circulatory system: Secondary | ICD-10-CM | POA: Diagnosis not present

## 2023-06-26 DIAGNOSIS — I132 Hypertensive heart and chronic kidney disease with heart failure and with stage 5 chronic kidney disease, or end stage renal disease: Secondary | ICD-10-CM | POA: Diagnosis not present

## 2023-06-27 ENCOUNTER — Inpatient Hospital Stay (HOSPITAL_COMMUNITY)
Admission: EM | Admit: 2023-06-27 | Discharge: 2023-07-03 | DRG: 377 | Disposition: A | Discharge status: Home Health | Attending: Internal Medicine | Admitting: Internal Medicine

## 2023-06-27 DIAGNOSIS — F03C0 Unspecified dementia, severe, without behavioral disturbance, psychotic disturbance, mood disturbance, and anxiety: Secondary | ICD-10-CM | POA: Diagnosis present

## 2023-06-27 DIAGNOSIS — F05 Delirium due to known physiological condition: Secondary | ICD-10-CM | POA: Diagnosis present

## 2023-06-27 DIAGNOSIS — Z5982 Transportation insecurity: Secondary | ICD-10-CM

## 2023-06-27 DIAGNOSIS — R933 Abnormal findings on diagnostic imaging of other parts of digestive tract: Secondary | ICD-10-CM | POA: Diagnosis not present

## 2023-06-27 DIAGNOSIS — E782 Mixed hyperlipidemia: Secondary | ICD-10-CM | POA: Diagnosis present

## 2023-06-27 DIAGNOSIS — I7143 Infrarenal abdominal aortic aneurysm, without rupture: Secondary | ICD-10-CM | POA: Diagnosis not present

## 2023-06-27 DIAGNOSIS — I714 Abdominal aortic aneurysm, without rupture, unspecified: Secondary | ICD-10-CM | POA: Diagnosis present

## 2023-06-27 DIAGNOSIS — I2510 Atherosclerotic heart disease of native coronary artery without angina pectoris: Secondary | ICD-10-CM | POA: Diagnosis present

## 2023-06-27 DIAGNOSIS — R7989 Other specified abnormal findings of blood chemistry: Secondary | ICD-10-CM | POA: Diagnosis not present

## 2023-06-27 DIAGNOSIS — K4090 Unilateral inguinal hernia, without obstruction or gangrene, not specified as recurrent: Secondary | ICD-10-CM | POA: Diagnosis not present

## 2023-06-27 DIAGNOSIS — K559 Vascular disorder of intestine, unspecified: Secondary | ICD-10-CM

## 2023-06-27 DIAGNOSIS — K920 Hematemesis: Secondary | ICD-10-CM | POA: Diagnosis not present

## 2023-06-27 DIAGNOSIS — D62 Acute posthemorrhagic anemia: Secondary | ICD-10-CM | POA: Diagnosis present

## 2023-06-27 DIAGNOSIS — H409 Unspecified glaucoma: Secondary | ICD-10-CM | POA: Diagnosis present

## 2023-06-27 DIAGNOSIS — I350 Nonrheumatic aortic (valve) stenosis: Secondary | ICD-10-CM | POA: Diagnosis not present

## 2023-06-27 DIAGNOSIS — I5022 Chronic systolic (congestive) heart failure: Secondary | ICD-10-CM | POA: Diagnosis present

## 2023-06-27 DIAGNOSIS — R410 Disorientation, unspecified: Secondary | ICD-10-CM | POA: Diagnosis not present

## 2023-06-27 DIAGNOSIS — E875 Hyperkalemia: Secondary | ICD-10-CM | POA: Diagnosis not present

## 2023-06-27 DIAGNOSIS — I252 Old myocardial infarction: Secondary | ICD-10-CM

## 2023-06-27 DIAGNOSIS — N186 End stage renal disease: Secondary | ICD-10-CM

## 2023-06-27 DIAGNOSIS — N289 Disorder of kidney and ureter, unspecified: Secondary | ICD-10-CM | POA: Diagnosis present

## 2023-06-27 DIAGNOSIS — Z7189 Other specified counseling: Secondary | ICD-10-CM | POA: Diagnosis not present

## 2023-06-27 DIAGNOSIS — K683 Retroperitoneal hematoma: Secondary | ICD-10-CM | POA: Diagnosis not present

## 2023-06-27 DIAGNOSIS — I480 Paroxysmal atrial fibrillation: Secondary | ICD-10-CM | POA: Diagnosis present

## 2023-06-27 DIAGNOSIS — E871 Hypo-osmolality and hyponatremia: Secondary | ICD-10-CM | POA: Diagnosis present

## 2023-06-27 DIAGNOSIS — Z992 Dependence on renal dialysis: Secondary | ICD-10-CM | POA: Diagnosis not present

## 2023-06-27 DIAGNOSIS — K921 Melena: Secondary | ICD-10-CM | POA: Diagnosis not present

## 2023-06-27 DIAGNOSIS — Z66 Do not resuscitate: Secondary | ICD-10-CM | POA: Insufficient documentation

## 2023-06-27 DIAGNOSIS — N400 Enlarged prostate without lower urinary tract symptoms: Secondary | ICD-10-CM | POA: Diagnosis not present

## 2023-06-27 DIAGNOSIS — Z8679 Personal history of other diseases of the circulatory system: Secondary | ICD-10-CM | POA: Diagnosis not present

## 2023-06-27 DIAGNOSIS — D631 Anemia in chronic kidney disease: Secondary | ICD-10-CM | POA: Diagnosis not present

## 2023-06-27 DIAGNOSIS — F17210 Nicotine dependence, cigarettes, uncomplicated: Secondary | ICD-10-CM | POA: Diagnosis present

## 2023-06-27 DIAGNOSIS — D72829 Elevated white blood cell count, unspecified: Secondary | ICD-10-CM | POA: Diagnosis present

## 2023-06-27 DIAGNOSIS — I132 Hypertensive heart and chronic kidney disease with heart failure and with stage 5 chronic kidney disease, or end stage renal disease: Secondary | ICD-10-CM | POA: Diagnosis present

## 2023-06-27 DIAGNOSIS — I4891 Unspecified atrial fibrillation: Secondary | ICD-10-CM | POA: Diagnosis not present

## 2023-06-27 DIAGNOSIS — H3530 Unspecified macular degeneration: Secondary | ICD-10-CM | POA: Diagnosis present

## 2023-06-27 DIAGNOSIS — Z7982 Long term (current) use of aspirin: Secondary | ICD-10-CM

## 2023-06-27 DIAGNOSIS — R6889 Other general symptoms and signs: Secondary | ICD-10-CM | POA: Diagnosis not present

## 2023-06-27 DIAGNOSIS — Z8673 Personal history of transient ischemic attack (TIA), and cerebral infarction without residual deficits: Secondary | ICD-10-CM

## 2023-06-27 DIAGNOSIS — I10 Essential (primary) hypertension: Secondary | ICD-10-CM | POA: Diagnosis not present

## 2023-06-27 DIAGNOSIS — D649 Anemia, unspecified: Secondary | ICD-10-CM | POA: Diagnosis not present

## 2023-06-27 DIAGNOSIS — Z7902 Long term (current) use of antithrombotics/antiplatelets: Secondary | ICD-10-CM | POA: Diagnosis not present

## 2023-06-27 DIAGNOSIS — R4189 Other symptoms and signs involving cognitive functions and awareness: Secondary | ICD-10-CM | POA: Diagnosis present

## 2023-06-27 DIAGNOSIS — Z91158 Patient's noncompliance with renal dialysis for other reason: Secondary | ICD-10-CM

## 2023-06-27 DIAGNOSIS — G3184 Mild cognitive impairment, so stated: Secondary | ICD-10-CM | POA: Diagnosis present

## 2023-06-27 DIAGNOSIS — I120 Hypertensive chronic kidney disease with stage 5 chronic kidney disease or end stage renal disease: Secondary | ICD-10-CM | POA: Diagnosis not present

## 2023-06-27 DIAGNOSIS — I447 Left bundle-branch block, unspecified: Secondary | ICD-10-CM | POA: Diagnosis present

## 2023-06-27 DIAGNOSIS — K922 Gastrointestinal hemorrhage, unspecified: Secondary | ICD-10-CM | POA: Diagnosis not present

## 2023-06-27 DIAGNOSIS — Z743 Need for continuous supervision: Secondary | ICD-10-CM | POA: Diagnosis not present

## 2023-06-27 DIAGNOSIS — K219 Gastro-esophageal reflux disease without esophagitis: Secondary | ICD-10-CM | POA: Diagnosis present

## 2023-06-27 DIAGNOSIS — N2581 Secondary hyperparathyroidism of renal origin: Secondary | ICD-10-CM | POA: Diagnosis present

## 2023-06-27 DIAGNOSIS — Z9181 History of falling: Secondary | ICD-10-CM

## 2023-06-27 DIAGNOSIS — R0689 Other abnormalities of breathing: Secondary | ICD-10-CM | POA: Diagnosis not present

## 2023-06-27 DIAGNOSIS — D72825 Bandemia: Secondary | ICD-10-CM | POA: Diagnosis not present

## 2023-06-27 DIAGNOSIS — Z809 Family history of malignant neoplasm, unspecified: Secondary | ICD-10-CM

## 2023-06-27 DIAGNOSIS — I499 Cardiac arrhythmia, unspecified: Secondary | ICD-10-CM | POA: Diagnosis not present

## 2023-06-27 DIAGNOSIS — Z95828 Presence of other vascular implants and grafts: Secondary | ICD-10-CM | POA: Diagnosis not present

## 2023-06-27 DIAGNOSIS — I25810 Atherosclerosis of coronary artery bypass graft(s) without angina pectoris: Secondary | ICD-10-CM | POA: Diagnosis not present

## 2023-06-27 DIAGNOSIS — Z79899 Other long term (current) drug therapy: Secondary | ICD-10-CM

## 2023-06-27 DIAGNOSIS — Z8546 Personal history of malignant neoplasm of prostate: Secondary | ICD-10-CM

## 2023-06-27 DIAGNOSIS — Z95820 Peripheral vascular angioplasty status with implants and grafts: Secondary | ICD-10-CM | POA: Diagnosis not present

## 2023-06-27 DIAGNOSIS — Z951 Presence of aortocoronary bypass graft: Secondary | ICD-10-CM

## 2023-06-27 DIAGNOSIS — R680 Hypothermia, not associated with low environmental temperature: Secondary | ICD-10-CM | POA: Diagnosis present

## 2023-06-27 DIAGNOSIS — S301XXA Contusion of abdominal wall, initial encounter: Secondary | ICD-10-CM | POA: Diagnosis not present

## 2023-06-27 DIAGNOSIS — Z9889 Other specified postprocedural states: Secondary | ICD-10-CM | POA: Diagnosis not present

## 2023-06-27 HISTORY — DX: End stage renal disease: Z99.2

## 2023-06-27 HISTORY — DX: End stage renal disease: N18.6

## 2023-06-27 LAB — HEPATITIS B SURFACE ANTIBODY, QUANTITATIVE

## 2023-06-28 ENCOUNTER — Other Ambulatory Visit: Payer: Self-pay

## 2023-06-28 ENCOUNTER — Encounter (HOSPITAL_COMMUNITY): Payer: Self-pay | Admitting: Emergency Medicine

## 2023-06-28 ENCOUNTER — Emergency Department (HOSPITAL_COMMUNITY)

## 2023-06-28 DIAGNOSIS — R933 Abnormal findings on diagnostic imaging of other parts of digestive tract: Secondary | ICD-10-CM

## 2023-06-28 DIAGNOSIS — I350 Nonrheumatic aortic (valve) stenosis: Secondary | ICD-10-CM | POA: Diagnosis present

## 2023-06-28 DIAGNOSIS — Z66 Do not resuscitate: Secondary | ICD-10-CM | POA: Diagnosis not present

## 2023-06-28 DIAGNOSIS — Z992 Dependence on renal dialysis: Secondary | ICD-10-CM

## 2023-06-28 DIAGNOSIS — F03C0 Unspecified dementia, severe, without behavioral disturbance, psychotic disturbance, mood disturbance, and anxiety: Secondary | ICD-10-CM | POA: Diagnosis present

## 2023-06-28 DIAGNOSIS — Z95820 Peripheral vascular angioplasty status with implants and grafts: Secondary | ICD-10-CM | POA: Diagnosis not present

## 2023-06-28 DIAGNOSIS — R7989 Other specified abnormal findings of blood chemistry: Secondary | ICD-10-CM | POA: Diagnosis not present

## 2023-06-28 DIAGNOSIS — I2510 Atherosclerotic heart disease of native coronary artery without angina pectoris: Secondary | ICD-10-CM | POA: Diagnosis present

## 2023-06-28 DIAGNOSIS — R4189 Other symptoms and signs involving cognitive functions and awareness: Secondary | ICD-10-CM

## 2023-06-28 DIAGNOSIS — I4891 Unspecified atrial fibrillation: Secondary | ICD-10-CM | POA: Diagnosis not present

## 2023-06-28 DIAGNOSIS — S51802A Unspecified open wound of left forearm, initial encounter: Secondary | ICD-10-CM | POA: Diagnosis not present

## 2023-06-28 DIAGNOSIS — R0902 Hypoxemia: Secondary | ICD-10-CM | POA: Diagnosis not present

## 2023-06-28 DIAGNOSIS — I25810 Atherosclerosis of coronary artery bypass graft(s) without angina pectoris: Secondary | ICD-10-CM | POA: Diagnosis not present

## 2023-06-28 DIAGNOSIS — N400 Enlarged prostate without lower urinary tract symptoms: Secondary | ICD-10-CM | POA: Diagnosis present

## 2023-06-28 DIAGNOSIS — F17210 Nicotine dependence, cigarettes, uncomplicated: Secondary | ICD-10-CM | POA: Diagnosis present

## 2023-06-28 DIAGNOSIS — Z95828 Presence of other vascular implants and grafts: Secondary | ICD-10-CM | POA: Diagnosis not present

## 2023-06-28 DIAGNOSIS — I447 Left bundle-branch block, unspecified: Secondary | ICD-10-CM | POA: Diagnosis present

## 2023-06-28 DIAGNOSIS — D62 Acute posthemorrhagic anemia: Secondary | ICD-10-CM | POA: Diagnosis present

## 2023-06-28 DIAGNOSIS — I480 Paroxysmal atrial fibrillation: Secondary | ICD-10-CM | POA: Diagnosis present

## 2023-06-28 DIAGNOSIS — N186 End stage renal disease: Secondary | ICD-10-CM | POA: Diagnosis present

## 2023-06-28 DIAGNOSIS — I132 Hypertensive heart and chronic kidney disease with heart failure and with stage 5 chronic kidney disease, or end stage renal disease: Secondary | ICD-10-CM | POA: Diagnosis not present

## 2023-06-28 DIAGNOSIS — Z48812 Encounter for surgical aftercare following surgery on the circulatory system: Secondary | ICD-10-CM

## 2023-06-28 DIAGNOSIS — D649 Anemia, unspecified: Secondary | ICD-10-CM | POA: Diagnosis not present

## 2023-06-28 DIAGNOSIS — K922 Gastrointestinal hemorrhage, unspecified: Secondary | ICD-10-CM | POA: Diagnosis not present

## 2023-06-28 DIAGNOSIS — S301XXA Contusion of abdominal wall, initial encounter: Secondary | ICD-10-CM | POA: Diagnosis not present

## 2023-06-28 DIAGNOSIS — Z9889 Other specified postprocedural states: Secondary | ICD-10-CM

## 2023-06-28 DIAGNOSIS — K559 Vascular disorder of intestine, unspecified: Secondary | ICD-10-CM | POA: Diagnosis present

## 2023-06-28 DIAGNOSIS — D72825 Bandemia: Secondary | ICD-10-CM

## 2023-06-28 DIAGNOSIS — Z809 Family history of malignant neoplasm, unspecified: Secondary | ICD-10-CM

## 2023-06-28 DIAGNOSIS — D631 Anemia in chronic kidney disease: Secondary | ICD-10-CM | POA: Diagnosis not present

## 2023-06-28 DIAGNOSIS — K219 Gastro-esophageal reflux disease without esophagitis: Secondary | ICD-10-CM | POA: Diagnosis present

## 2023-06-28 DIAGNOSIS — Z79899 Other long term (current) drug therapy: Secondary | ICD-10-CM

## 2023-06-28 DIAGNOSIS — K4090 Unilateral inguinal hernia, without obstruction or gangrene, not specified as recurrent: Secondary | ICD-10-CM | POA: Diagnosis not present

## 2023-06-28 DIAGNOSIS — Z5982 Transportation insecurity: Secondary | ICD-10-CM

## 2023-06-28 DIAGNOSIS — N289 Disorder of kidney and ureter, unspecified: Secondary | ICD-10-CM | POA: Diagnosis present

## 2023-06-28 DIAGNOSIS — H3530 Unspecified macular degeneration: Secondary | ICD-10-CM | POA: Diagnosis present

## 2023-06-28 DIAGNOSIS — F05 Delirium due to known physiological condition: Secondary | ICD-10-CM | POA: Diagnosis present

## 2023-06-28 DIAGNOSIS — I5022 Chronic systolic (congestive) heart failure: Secondary | ICD-10-CM | POA: Diagnosis present

## 2023-06-28 DIAGNOSIS — Z7902 Long term (current) use of antithrombotics/antiplatelets: Secondary | ICD-10-CM | POA: Diagnosis not present

## 2023-06-28 DIAGNOSIS — I252 Old myocardial infarction: Secondary | ICD-10-CM | POA: Diagnosis not present

## 2023-06-28 DIAGNOSIS — I5020 Unspecified systolic (congestive) heart failure: Secondary | ICD-10-CM | POA: Diagnosis not present

## 2023-06-28 DIAGNOSIS — Z951 Presence of aortocoronary bypass graft: Secondary | ICD-10-CM

## 2023-06-28 DIAGNOSIS — R410 Disorientation, unspecified: Secondary | ICD-10-CM

## 2023-06-28 DIAGNOSIS — M7989 Other specified soft tissue disorders: Secondary | ICD-10-CM | POA: Diagnosis not present

## 2023-06-28 DIAGNOSIS — N2581 Secondary hyperparathyroidism of renal origin: Secondary | ICD-10-CM | POA: Diagnosis present

## 2023-06-28 DIAGNOSIS — Z7982 Long term (current) use of aspirin: Secondary | ICD-10-CM

## 2023-06-28 DIAGNOSIS — E782 Mixed hyperlipidemia: Secondary | ICD-10-CM | POA: Diagnosis present

## 2023-06-28 DIAGNOSIS — K921 Melena: Secondary | ICD-10-CM | POA: Diagnosis not present

## 2023-06-28 DIAGNOSIS — K920 Hematemesis: Secondary | ICD-10-CM | POA: Diagnosis present

## 2023-06-28 DIAGNOSIS — D72829 Elevated white blood cell count, unspecified: Secondary | ICD-10-CM | POA: Diagnosis present

## 2023-06-28 DIAGNOSIS — I7143 Infrarenal abdominal aortic aneurysm, without rupture: Secondary | ICD-10-CM | POA: Diagnosis not present

## 2023-06-28 DIAGNOSIS — E871 Hypo-osmolality and hyponatremia: Secondary | ICD-10-CM | POA: Diagnosis not present

## 2023-06-28 DIAGNOSIS — I10 Essential (primary) hypertension: Secondary | ICD-10-CM

## 2023-06-28 DIAGNOSIS — K683 Retroperitoneal hematoma: Secondary | ICD-10-CM | POA: Diagnosis not present

## 2023-06-28 DIAGNOSIS — Z7189 Other specified counseling: Secondary | ICD-10-CM | POA: Diagnosis not present

## 2023-06-28 DIAGNOSIS — R531 Weakness: Secondary | ICD-10-CM | POA: Diagnosis not present

## 2023-06-28 DIAGNOSIS — I714 Abdominal aortic aneurysm, without rupture, unspecified: Secondary | ICD-10-CM | POA: Diagnosis not present

## 2023-06-28 DIAGNOSIS — R680 Hypothermia, not associated with low environmental temperature: Secondary | ICD-10-CM | POA: Diagnosis present

## 2023-06-28 DIAGNOSIS — E875 Hyperkalemia: Secondary | ICD-10-CM | POA: Diagnosis present

## 2023-06-28 DIAGNOSIS — Z8546 Personal history of malignant neoplasm of prostate: Secondary | ICD-10-CM

## 2023-06-28 DIAGNOSIS — Z91158 Patient's noncompliance with renal dialysis for other reason: Secondary | ICD-10-CM

## 2023-06-28 DIAGNOSIS — Z8673 Personal history of transient ischemic attack (TIA), and cerebral infarction without residual deficits: Secondary | ICD-10-CM

## 2023-06-28 DIAGNOSIS — Z8679 Personal history of other diseases of the circulatory system: Secondary | ICD-10-CM | POA: Diagnosis not present

## 2023-06-28 DIAGNOSIS — Z9181 History of falling: Secondary | ICD-10-CM

## 2023-06-28 DIAGNOSIS — H409 Unspecified glaucoma: Secondary | ICD-10-CM | POA: Diagnosis present

## 2023-06-28 DIAGNOSIS — Z743 Need for continuous supervision: Secondary | ICD-10-CM | POA: Diagnosis not present

## 2023-06-28 LAB — HEMOGLOBIN AND HEMATOCRIT, BLOOD
HCT: 21.4 % — ABNORMAL LOW (ref 39.0–52.0)
Hemoglobin: 7.4 g/dL — ABNORMAL LOW (ref 13.0–17.0)

## 2023-06-28 LAB — CBC
HCT: 20.5 % — ABNORMAL LOW (ref 39.0–52.0)
HCT: 23.1 % — ABNORMAL LOW (ref 39.0–52.0)
Hemoglobin: 6.9 g/dL — CL (ref 13.0–17.0)
Hemoglobin: 8 g/dL — ABNORMAL LOW (ref 13.0–17.0)
MCH: 29.7 pg (ref 26.0–34.0)
MCH: 30.1 pg (ref 26.0–34.0)
MCHC: 33.7 g/dL (ref 30.0–36.0)
MCHC: 34.6 g/dL (ref 30.0–36.0)
MCV: 88.4 fL (ref 80.0–100.0)
MCV: 86.8 fL (ref 80.0–100.0)
Platelets: 293 10*3/uL (ref 150–400)
Platelets: 243 10*3/uL (ref 150–400)
RBC: 2.32 MIL/uL — ABNORMAL LOW (ref 4.22–5.81)
RBC: 2.66 MIL/uL — ABNORMAL LOW (ref 4.22–5.81)
RDW: 18.2 % — ABNORMAL HIGH (ref 11.5–15.5)
RDW: 17 % — ABNORMAL HIGH (ref 11.5–15.5)
WBC: 13 10*3/uL — ABNORMAL HIGH (ref 4.0–10.5)
WBC: 26.8 10*3/uL — ABNORMAL HIGH (ref 4.0–10.5)
nRBC: 0 % (ref 0.0–0.2)
nRBC: 0 % (ref 0.0–0.2)

## 2023-06-28 LAB — COMPREHENSIVE METABOLIC PANEL WITH GFR
ALT: 5 U/L (ref 0–44)
AST: 17 U/L (ref 15–41)
Albumin: 2.4 g/dL — ABNORMAL LOW (ref 3.5–5.0)
Alkaline Phosphatase: 90 U/L (ref 38–126)
Anion gap: 16 — ABNORMAL HIGH (ref 5–15)
BUN: 86 mg/dL — ABNORMAL HIGH (ref 8–23)
CO2: 22 mmol/L (ref 22–32)
Calcium: 7.7 mg/dL — ABNORMAL LOW (ref 8.9–10.3)
Chloride: 85 mmol/L — ABNORMAL LOW (ref 98–111)
Creatinine, Ser: 12.99 mg/dL — ABNORMAL HIGH (ref 0.61–1.24)
GFR, Estimated: 3 mL/min — ABNORMAL LOW (ref >60)
Glucose, Bld: 137 mg/dL — ABNORMAL HIGH (ref 70–99)
Potassium: 6.5 mmol/L (ref 3.5–5.1)
Sodium: 123 mmol/L — ABNORMAL LOW (ref 135–145)
Total Bilirubin: 1 mg/dL (ref 0.0–1.2)
Total Protein: 5.4 g/dL — ABNORMAL LOW (ref 6.5–8.1)

## 2023-06-28 LAB — LACTIC ACID, PLASMA
Lactic Acid, Venous: 1.1 mmol/L (ref 0.5–1.9)
Lactic Acid, Venous: 1.8 mmol/L (ref 0.5–1.9)

## 2023-06-28 LAB — RENAL FUNCTION PANEL
Albumin: 2.2 g/dL — ABNORMAL LOW (ref 3.5–5.0)
Anion gap: 20 — ABNORMAL HIGH (ref 5–15)
BUN: 104 mg/dL — ABNORMAL HIGH (ref 8–23)
CO2: 20 mmol/L — ABNORMAL LOW (ref 22–32)
Calcium: 7.8 mg/dL — ABNORMAL LOW (ref 8.9–10.3)
Chloride: 84 mmol/L — ABNORMAL LOW (ref 98–111)
Creatinine, Ser: 13.71 mg/dL — ABNORMAL HIGH (ref 0.61–1.24)
GFR, Estimated: 3 mL/min — ABNORMAL LOW (ref >60)
Glucose, Bld: 113 mg/dL — ABNORMAL HIGH (ref 70–99)
Phosphorus: 6.7 mg/dL — ABNORMAL HIGH (ref 2.5–4.6)
Potassium: 5.8 mmol/L — ABNORMAL HIGH (ref 3.5–5.1)
Sodium: 124 mmol/L — ABNORMAL LOW (ref 135–145)

## 2023-06-28 LAB — PREPARE RBC (CROSSMATCH)

## 2023-06-28 LAB — HEPATITIS B SURFACE ANTIGEN: Hepatitis B Surface Ag: NONREACTIVE

## 2023-06-28 MED ORDER — VANCOMYCIN HCL 2000 MG/400ML IV SOLN
2000.0000 mg | Freq: Once | INTRAVENOUS | Status: AC
  Administered 2023-06-28: 2000 mg via INTRAVENOUS
  Filled 2023-06-28: qty 400

## 2023-06-28 MED ORDER — ACETAMINOPHEN 650 MG RE SUPP: 650.0000 mg | Freq: Four times a day (QID) | RECTAL | Status: DC | PRN

## 2023-06-28 MED ORDER — ONDANSETRON HCL 4 MG PO TABS: 4.0000 mg | ORAL_TABLET | Freq: Four times a day (QID) | ORAL | Status: DC | PRN

## 2023-06-28 MED ORDER — DEXTROSE 50 % IV SOLN
1.0000 | Freq: Once | INTRAVENOUS | Status: AC
  Administered 2023-06-28: 50 mL via INTRAVENOUS
  Filled 2023-06-28: qty 50

## 2023-06-28 MED ORDER — HEPARIN SODIUM (PORCINE) 1000 UNIT/ML DIALYSIS
2000.0000 [IU] | Freq: Once | INTRAMUSCULAR | Status: AC
  Administered 2023-06-29: 2000 [IU] via INTRAVENOUS_CENTRAL

## 2023-06-28 MED ORDER — CALCIUM ACETATE (PHOS BINDER) 667 MG PO CAPS
1334.0000 mg | ORAL_CAPSULE | Freq: Three times a day (TID) | ORAL | Status: DC
  Administered 2023-06-29 – 2023-07-02 (×9): 1334 mg via ORAL
  Filled 2023-06-28 (×17): qty 2

## 2023-06-28 MED ORDER — ANTICOAGULANT SODIUM CITRATE 4% (200MG/5ML) IV SOLN: 5.0000 mL | Status: DC | PRN

## 2023-06-28 MED ORDER — SODIUM CHLORIDE 0.9 % IV BOLUS
500.0000 mL | Freq: Once | INTRAVENOUS | Status: AC
  Administered 2023-06-28: 500 mL via INTRAVENOUS

## 2023-06-28 MED ORDER — PIPERACILLIN-TAZOBACTAM IN DEX 2-0.25 GM/50ML IV SOLN
2.2500 g | Freq: Three times a day (TID) | INTRAVENOUS | Status: DC
  Administered 2023-06-28 – 2023-07-01 (×9): 2.25 g via INTRAVENOUS
  Filled 2023-06-28 (×12): qty 50

## 2023-06-28 MED ORDER — ATORVASTATIN CALCIUM 80 MG PO TABS
80.0000 mg | ORAL_TABLET | Freq: Every day | ORAL | Status: DC
  Administered 2023-06-28 – 2023-07-02 (×5): 80 mg via ORAL
  Filled 2023-06-28 (×5): qty 1

## 2023-06-28 MED ORDER — HEPARIN SODIUM (PORCINE) 1000 UNIT/ML DIALYSIS: 2000.0000 [IU] | INTRAMUSCULAR | Status: DC | PRN

## 2023-06-28 MED ORDER — HEPARIN SODIUM (PORCINE) 1000 UNIT/ML DIALYSIS: 1000.0000 [IU] | INTRAMUSCULAR | Status: DC | PRN

## 2023-06-28 MED ORDER — MIRTAZAPINE 15 MG PO TABS: 15.0000 mg | ORAL_TABLET | Freq: Every day | ORAL | Status: DC

## 2023-06-28 MED ORDER — ALTEPLASE 2 MG IJ SOLR: 2.0000 mg | Freq: Once | INTRAMUSCULAR | Status: DC | PRN

## 2023-06-28 MED ORDER — SODIUM ZIRCONIUM CYCLOSILICATE 10 G PO PACK
10.0000 g | PACK | Freq: Three times a day (TID) | ORAL | Status: DC
  Administered 2023-06-28: 10 g via ORAL
  Filled 2023-06-28 (×2): qty 1

## 2023-06-28 MED ORDER — DEXTROSE 50 % IV SOLN
INTRAVENOUS | Status: AC
  Filled 2023-06-28: qty 50

## 2023-06-28 MED ORDER — HYDROMORPHONE HCL 1 MG/ML IJ SOLN: 0.5000 mg | INTRAMUSCULAR | Status: DC | PRN

## 2023-06-28 MED ORDER — ACETAMINOPHEN 325 MG PO TABS
650.0000 mg | ORAL_TABLET | Freq: Four times a day (QID) | ORAL | Status: DC | PRN
  Filled 2023-06-28: qty 2

## 2023-06-28 MED ORDER — ONDANSETRON HCL 4 MG/2ML IJ SOLN: 4.0000 mg | Freq: Four times a day (QID) | INTRAMUSCULAR | Status: DC | PRN

## 2023-06-28 MED ORDER — CALCIUM GLUCONATE-NACL 1-0.675 GM/50ML-% IV SOLN
1.0000 g | Freq: Once | INTRAVENOUS | Status: AC
  Administered 2023-06-28: 1000 mg via INTRAVENOUS
  Filled 2023-06-28: qty 50

## 2023-06-28 MED ORDER — INSULIN ASPART 100 UNIT/ML IV SOLN
5.0000 [IU] | Freq: Once | INTRAVENOUS | Status: AC
  Administered 2023-06-28: 5 [IU] via INTRAVENOUS

## 2023-06-28 MED ORDER — VANCOMYCIN HCL IN DEXTROSE 1-5 GM/200ML-% IV SOLN
1000.0000 mg | Freq: Once | INTRAVENOUS | Status: AC
  Administered 2023-06-29: 1000 mg via INTRAVENOUS

## 2023-06-28 MED ORDER — METOPROLOL SUCCINATE ER 25 MG PO TB24
50.0000 mg | ORAL_TABLET | Freq: Every day | ORAL | Status: DC
Start: 2023-06-28 — End: 2023-06-28
  Administered 2023-06-28: 50 mg via ORAL
  Filled 2023-06-28: qty 2

## 2023-06-28 MED ORDER — LIDOCAINE-PRILOCAINE 2.5-2.5 % EX CREA: 1.0000 | TOPICAL_CREAM | CUTANEOUS | Status: DC | PRN

## 2023-06-28 MED ORDER — VANCOMYCIN VARIABLE DOSE PER UNSTABLE RENAL FUNCTION (PHARMACIST DOSING): Status: DC

## 2023-06-28 MED ORDER — ZOLPIDEM TARTRATE 5 MG PO TABS: 10.0000 mg | ORAL_TABLET | Freq: Every evening | ORAL | Status: DC | PRN

## 2023-06-28 MED ORDER — PANTOPRAZOLE SODIUM 40 MG IV SOLR
40.0000 mg | Freq: Two times a day (BID) | INTRAVENOUS | Status: DC
  Administered 2023-06-28 – 2023-06-30 (×6): 40 mg via INTRAVENOUS
  Filled 2023-06-28 (×6): qty 10

## 2023-06-28 MED ORDER — TAMSULOSIN HCL 0.4 MG PO CAPS
0.4000 mg | ORAL_CAPSULE | Freq: Every day | ORAL | Status: DC
  Administered 2023-06-29 – 2023-07-02 (×4): 0.4 mg via ORAL
  Filled 2023-06-28 (×3): qty 1

## 2023-06-28 MED ORDER — NEPRO/CARBSTEADY PO LIQD: 237.0000 mL | ORAL | Status: DC | PRN

## 2023-06-28 MED ORDER — AMIODARONE HCL 200 MG PO TABS
200.0000 mg | ORAL_TABLET | Freq: Every day | ORAL | Status: DC
  Administered 2023-06-28 – 2023-07-02 (×5): 200 mg via ORAL
  Filled 2023-06-28 (×5): qty 1

## 2023-06-28 MED ORDER — LIDOCAINE HCL (PF) 1 % IJ SOLN: 5.0000 mL | INTRAMUSCULAR | Status: DC | PRN

## 2023-06-28 MED ORDER — SODIUM CHLORIDE 0.9% IV SOLUTION: Freq: Once | INTRAVENOUS | Status: DC

## 2023-06-28 MED ORDER — CHLORHEXIDINE GLUCONATE CLOTH 2 % EX PADS
6.0000 | MEDICATED_PAD | Freq: Every day | CUTANEOUS | Status: DC
  Administered 2023-06-30 – 2023-07-01 (×2): 6 via TOPICAL

## 2023-06-28 MED ORDER — IOHEXOL 300 MG/ML  SOLN
100.0000 mL | Freq: Once | INTRAMUSCULAR | Status: AC | PRN
  Administered 2023-06-28: 100 mL via INTRAVENOUS

## 2023-06-28 MED ORDER — PENTAFLUOROPROP-TETRAFLUOROETH EX AERO: 1.0000 | INHALATION_SPRAY | CUTANEOUS | Status: DC | PRN

## 2023-06-28 MED ORDER — MIRTAZAPINE 15 MG PO TABS
7.5000 mg | ORAL_TABLET | Freq: Every day | ORAL | Status: DC
  Administered 2023-06-28 – 2023-07-02 (×5): 7.5 mg via ORAL
  Filled 2023-06-28 (×5): qty 1

## 2023-06-28 MED ORDER — ZOLPIDEM TARTRATE 5 MG PO TABS
5.0000 mg | ORAL_TABLET | Freq: Every evening | ORAL | Status: DC | PRN
  Administered 2023-07-02: 5 mg via ORAL
  Filled 2023-06-28 (×2): qty 1

## 2023-06-28 MED ORDER — FENTANYL CITRATE PF 50 MCG/ML IJ SOSY
25.0000 ug | PREFILLED_SYRINGE | Freq: Once | INTRAMUSCULAR | Status: AC
  Administered 2023-06-28: 25 ug via INTRAVENOUS
  Filled 2023-06-28: qty 1

## 2023-06-28 NOTE — H&P (Signed)
 History and Physical    Ronald [name] ZOX:[id number] DOB: 1940-07-16 DOA: 06/27/2023  PCP: Omie Bickers, MD   Patient coming from: Home  Chief Complaint: Rectal bleed  HPI: Ronald [name] is a 83 y.o. male with medical history significant for CAD status post CABG, ESRD on HD, hypertension, CVA, neurocognitive disorder, dyslipidemia, and atrial fibrillation-not currently on anticoagulation due to confusion and fall risk.  History taking is slightly limited due to some confusion and what appears to be a slight language barrier.  Nobody at bedside to help assist with history taking, but it appears that he came in due to some blood in his stools and some dark stools that appears to have started several hours prior to him arriving to the ED.  He complains of some mild weakness, but denies any lightheadedness or dizziness.  He denies any pain complaints and states that he last went to dialysis on 5/12 and could not make it to other dialysis sessions due to transportation issues.   ED Course: Vital signs with some mild hypothermia with temperature 97.4 F, but otherwise stable.  Hemoglobin 6.9, potassium 6.5, leukocytosis of 13,000, sodium 123, creatinine 12.99.  He has been started on 2 unit PRBC transfusion and has been given some D50 and insulin .  CT scan of the abdomen pelvis showing findings consistent with ischemic colitis which were discussed with Dr. Zoila Hines with vascular surgery who did not feel that there is any emergent surgical needs but agreed with transfer to [facility].  Case discussed with GI as well who agrees to see in transfer.  Review of Systems: Could not be obtained given patient condition.  Past Medical History:  Diagnosis Date   CHF (congestive heart failure) (HCC)    GERD (gastroesophageal reflux disease)    Glaucoma    Hypertension    Macular degeneration    Myocardial infarction [facility])    1999   Prostate cancer (HCC) 02/2018   Renal insufficiency    Stroke (HCC)     no deficits; had stroke while trying to place "stents in legs".    Past Surgical History:  Procedure Laterality Date   ABDOMINAL AORTIC ANEURYSM REPAIR  2012   IN [place]    ABDOMINAL AORTIC ENDOVASCULAR STENT GRAFT N/A 06/17/2023   Procedure: INSERTION, ENDOVASCULAR STENT GRAFT, AORTA, ABDOMINAL;  Surgeon: Young Hensen, MD;  Location: [facility] OR;  Service: Vascular;  Laterality: N/A;   APPENDECTOMY     AV FISTULA PLACEMENT Left 10/16/2020   Procedure: LEFT ARM ARTERIOVENOUS (AV) FISTULA CREATION;  Surgeon: Mayo Speck, MD;  Location: [facility];  Service: Vascular;  Laterality: Left;   CORONARY ARTERY BYPASS GRAFT  01/1998   3 vessels 18 years ago   ENDARTERECTOMY FEMORAL Right 06/17/2023   Procedure: ENDARTERECTOMY, COMMON FEMORAL AND SUPERFICIAL FEMORAL;  Surgeon: Young Hensen, MD;  Location: [facility] OR;  Service: Vascular;  Laterality: Right;   INGUINAL HERNIA REPAIR Right 09/20/2015   Procedure: REPAIR RIGHT INGUINAL HERNIA  WITH MESH;  Surgeon: Franki Isles, MD;  Location: [facility];  Service: General;  Laterality: Right;   INSERTION OF DIALYSIS CATHETER Right 08/02/2020   Procedure: INSERTION OF RIGHT TUNNELED INTERNAL JUGULAR  DIALYSIS CATHETER;  Surgeon: Mayo Speck, MD;  Location: [facility];  Service: Vascular;  Laterality: Right;   INSERTION OF ILIAC STENT Right 06/17/2023   Procedure: INSERTION, STENT, ARTERY, ILIAC;  Surgeon: Young Hensen, MD;  Location: [facility];  Service: Vascular;  Laterality: Right;   LOWER  EXTREMITY ANGIOGRAM Right 06/17/2023   Procedure: [employer], LOWER EXTREMITY;  Surgeon: Young Hensen, MD;  Location: [facility] OR;  Service: Vascular;  Laterality: Right;   PATCH ANGIOPLASTY Right 06/17/2023   Procedure: ANGIOPLASTY, USING PATCH GRAFT USING Angelyn Barges;  Surgeon: Young Hensen, MD;  Location: [facility];  Service: Vascular;  Laterality: Right;   REMOVAL OF A DIALYSIS CATHETER N/A 03/19/2021   Procedure: MINOR REMOVAL OF A TUNNELED DIALYSIS  CATHETER;  Surgeon: Mayo Speck, MD;  Location: [facility];  Service: Vascular;  Laterality: N/A;   tumor removal from bladder     ULTRASOUND GUIDANCE FOR VASCULAR ACCESS Bilateral 06/17/2023   Procedure: ULTRASOUND GUIDANCE, FOR VASCULAR ACCESS;  Surgeon: Young Hensen, MD;  Location: [facility] OR;  Service: Vascular;  Laterality: Bilateral;     reports that he has quit smoking. His smoking use included cigarettes. He started smoking about 65 years ago. He has a 32.9 pack-year smoking history. He has never used smokeless tobacco. He reports that he does not drink alcohol and does not use drugs.  No Known Allergies  Family History  Problem Relation Age of Onset   Cancer Father     Prior to Admission medications   Medication Sig Start Date End Date Taking? Authorizing Provider  amiodarone  (PACERONE ) 200 MG tablet Take 1 tablet (200 mg total) by mouth 2 (two) times daily for 7 days, THEN 1 tablet (200 mg total) daily for 14 days. 06/23/23 07/14/23  Baglia, Corrina, PA-C  amLODipine  (NORVASC ) 5 MG tablet Take 5 mg by mouth at bedtime. 01/24/23   [provider]  aspirin  EC 81 MG tablet Take 1 tablet (81 mg total) by mouth daily. Swallow whole. 11/17/22   Demaris Fillers, MD  atorvastatin  (LIPITOR) 80 MG tablet Take 80 mg by mouth in the morning. 07/05/19   [provider]  calcium  acetate (PHOSLO ) 667 MG capsule Take 1,334 mg by mouth 3 (three) times daily. citracal    [provider]  carvedilol  (COREG ) 25 MG tablet Take 1 tablet (25 mg total) by mouth 2 (two) times daily with a meal. 11/17/22   Tat, Myrtie Atkinson, MD  clopidogrel  (PLAVIX ) 75 MG tablet Take 1 tablet (75 mg total) by mouth daily. 06/23/23   Baglia, Corrina, PA-C  metoprolol  succinate (TOPROL  XL) 50 MG 24 hr tablet Take 1 tablet (50 mg total) by mouth daily. Take with or immediately following a meal. 06/23/23 06/22/24  Baglia, Corrina, PA-C  mirtazapine  (REMERON ) 15 MG tablet Take 15 mg by mouth at bedtime. 05/02/23   [provider]  pantoprazole  (PROTONIX ) 40 MG tablet Take 40 mg by mouth in the morning.    [provider]  tamsulosin  (FLOMAX ) 0.4 MG CAPS capsule Take 1 capsule (0.4 mg total) by mouth daily after supper. 12/24/22   McKenzie, Arden Beck, MD  traMADol  (ULTRAM ) 50 MG tablet Take 50 mg by mouth every 8 (eight) hours as needed (pain.).    [provider]  zolpidem  (AMBIEN ) 10 MG tablet Take 10 mg by mouth at bedtime as needed for sleep.    [provider]    Physical Exam: Vitals:   06/28/23 0600 06/28/23 0630 06/28/23 0640 06/28/23 0653  BP: (!) 122/47 126/66 (!) 137/58 128/68  Pulse: 72 77 73 72  Resp: 20  (!) 29 14  Temp:  97.8 F (36.6 C) 97.8 F (36.6 C) (!) 97.4 F (36.3 C)  TempSrc:  Axillary Axillary Axillary  SpO2: 100% (!) 88% 99% 100%  Constitutional: NAD, calm, comfortable Vitals:   06/28/23 0600 06/28/23 0630 06/28/23 0640 06/28/23 0653  BP: (!) 122/47 126/66 (!) 137/58 128/68  Pulse: 72 77 73 72  Resp: 20  (!) 29 14  Temp:  97.8 F (36.6 C) 97.8 F (36.6 C) (!) 97.4 F (36.3 C)  TempSrc:  Axillary Axillary Axillary  SpO2: 100% (!) 88% 99% 100%   Eyes: lids and conjunctivae normal Neck: normal, supple Respiratory: clear to auscultation bilaterally. Normal respiratory effort. No accessory muscle use.  Cardiovascular: Irregular rate and rhythm, no murmurs. Abdomen: Diffuse tenderness to palpation in all 4 quadrants, no distention. Bowel sounds positive.  Musculoskeletal:  No edema. Skin: no rashes, lesions, ulcers.  Psychiatric: Flat affect  Labs on Admission: I have personally reviewed following labs and imaging studies  CBC: Recent Labs  Lab 06/22/23 0404 06/23/23 0901 06/28/23 0102  WBC 9.3 7.1 13.0*  HGB 8.7* 8.7* 6.9*  HCT 26.4* 26.5* 20.5*  MCV 86.3 87.2 88.4  PLT 138* 174 293   Basic Metabolic Panel: Recent Labs  Lab 06/21/23 0815 06/23/23 0922 06/28/23 0102  NA 131* 133* 123*  K 4.3 4.0 6.5*  CL 93* 93* 85*   CO2 20* 22 22  GLUCOSE 104* 93 137*  BUN 61* 62* 86*  CREATININE 9.71* 10.66* 12.99*  CALCIUM  8.3* 8.2* 7.7*  PHOS 8.1* 6.9*  --    GFR: Estimated Creatinine Clearance: 5.2 mL/min (A) (by C-G formula based on SCr of 12.99 mg/dL (H)). Liver Function Tests: Recent Labs  Lab 06/21/23 0815 06/23/23 0922 06/28/23 0102  AST  --   --  17  ALT  --   --  5  ALKPHOS  --   --  90  BILITOT  --   --  1.0  PROT  --   --  5.4*  ALBUMIN  2.5* 2.3* 2.4*   No results for input(s): "LIPASE", "AMYLASE" in the last 168 hours. No results for input(s): "AMMONIA" in the last 168 hours. Coagulation Profile: No results for input(s): "INR", "PROTIME" in the last 168 hours. Cardiac Enzymes: No results for input(s): "CKTOTAL", "CKMB", "CKMBINDEX", "TROPONINI" in the last 168 hours. BNP (last 3 results) No results for input(s): "PROBNP" in the last 8760 hours. HbA1C: No results for input(s): "HGBA1C" in the last 72 hours. CBG: No results for input(s): "GLUCAP" in the last 168 hours. Lipid Profile: No results for input(s): "CHOL", "HDL", "LDLCALC", "TRIG", "CHOLHDL", "LDLDIRECT" in the last 72 hours. Thyroid  Function Tests: No results for input(s): "TSH", "T4TOTAL", "FREET4", "T3FREE", "THYROIDAB" in the last 72 hours. Anemia Panel: No results for input(s): "VITAMINB12", "FOLATE", "FERRITIN", "TIBC", "IRON", "RETICCTPCT" in the last 72 hours. Urine analysis:    Component Value Date/Time   COLORURINE YELLOW 06/10/2023 1007   APPEARANCEUR CLEAR 06/10/2023 1007   APPEARANCEUR Clear 10/29/2022 0906   LABSPEC 1.006 06/10/2023 1007   PHURINE 7.0 06/10/2023 1007   GLUCOSEU NEGATIVE 06/10/2023 1007   HGBUR NEGATIVE 06/10/2023 1007   BILIRUBINUR NEGATIVE 06/10/2023 1007   BILIRUBINUR Negative 10/29/2022 0906   KETONESUR NEGATIVE 06/10/2023 1007   PROTEINUR 100 (A) 06/10/2023 1007   UROBILINOGEN 0.2 05/17/2019 0917   NITRITE NEGATIVE 06/10/2023 1007   LEUKOCYTESUR NEGATIVE 06/10/2023 1007     Radiological Exams on Admission: CT ABDOMEN PELVIS W CONTRAST Result Date: 06/28/2023 CLINICAL DATA:  Acute nonlocalized abdominal pain EXAM: CT ABDOMEN AND PELVIS WITH CONTRAST TECHNIQUE: Multidetector CT imaging of the abdomen and pelvis was performed using the standard protocol following bolus administration of intravenous contrast.  RADIATION DOSE REDUCTION: This exam was performed according to the departmental dose-optimization program which includes automated exposure control, adjustment of the mA and/or kV according to patient size and/or use of iterative reconstruction technique. CONTRAST:  OMNIPAQUE  IOHEXOL  300 MG/ML  SOLN COMPARISON:  06/19/2023 FINDINGS: Lower chest: Improving consolidation within the visualized posterobasal right lower lobe. Small right pleural effusion is stable. Cardiac size within normal limits. Hepatobiliary: No focal liver abnormality is seen. No gallstones, gallbladder wall thickening, or biliary dilatation. Pancreas: Unremarkable Spleen: Unremarkable Adrenals/Urinary Tract: The adrenal glands are unremarkable. The kidneys are markedly atrophic bilaterally. Multiple simple cortical cysts are seen within the kidneys bilaterally for which no follow-up imaging is recommended. 2.4 cm hyperdense lesion is seen within the lower pole the left kidney which is indeterminate but demonstrates approximately 5 mm interval increase in size since more remote prior examination of 03/25/2022. The kidneys are otherwise unremarkable. Bladder unremarkable. Stomach/Bowel: There is diffuse mural thickening and mild pericolonic inflammatory stranding involving descending and proximal sigmoid colon suspicious for changes of ischemic colitis though a long segment infectious or inflammatory colitis could appear similarly. The stomach, small bowel, and large bowel are otherwise unremarkable. Appendix absent. No free intraperitoneal gas or fluid. No evidence of obstruction. Vascular/Lymphatic:  Endograft repair of an infrarenal abdominal aortic aneurysm is again noted with extension of the proximal landing zone cranially with partial coverage of the origin of the superior mesenteric artery best seen on sagittal image # 84/4 the bifurcated stent graft demonstrates distal landing zones within the left distal common iliac artery just above the bifurcation and appears to extend into the right external iliac artery, unchanged from prior examination. The aneurysm sac is unchanged measuring 11.1 x 11.3 cm at axial image # 48/2 in the region of the contained leak along the left posterolateral margin aneurysm sac. There is extensive atherosclerotic calcification within the proximal and mid superior mesenteric artery without evidence of hemodynamically significant stenosis on this non arteriographic study. Patency of the inferior mesenteric artery is not well assessed on this non arteriographic study. Stable hematoma within the right inguinal region superficial to the right common femoral artery. Hematoma within the right retroperitoneum appears decreased in size since prior examination. No active extravasation identified. No pathologic adenopathy within the abdomen and pelvis. Reproductive: Fiducial markers noted within prostate gland. Mild prostatic hypertrophy. Other: Mild diffuse subcutaneous body wall edema. Small left inguinal hernia again noted with the proximal sigmoid colon noted in the region of the internal ring. Musculoskeletal: No acute bone abnormality. Degenerative changes are seen within the lumbar spine. IMPRESSION: 1. Diffuse mural thickening and mild pericolonic inflammatory stranding involving the descending and proximal sigmoid colon suspicious for changes of ischemic colitis though a long segment infectious or inflammatory colitis could appear similarly. Superior mesenteric artery is patent though diseased with partial coverage of the origin by the proximally extended endovascular stent graft.  Inferior mesenteric artery patency is not well assessed on this non arteriographic examination. No evidence of obstruction or perforation. 2. 11.3 cm infrarenal abdominal aortic aneurysm status post endograft repair. Stable size of the contained leak along the left posterolateral margin of the aneurysm sac. No active extravasation identified. 3. Stable right inguinal hematoma superficial to the right common femoral artery. Decreasing right retroperitoneal hematoma. No active extravasation identified. 4. 2.4 cm indeterminate hyperdense lesion within the lower pole the left kidney demonstrating approximately 5 mm interval increase in size since more remote prior examination of 03/25/2022. While this may represent a hemorrhagic or proteinaceous  cyst, follow-up multiphasic CT or MRI examination is recommended for further evaluation. 5. Improving consolidation within the visualized posterobasal right lower lobe. Stable small right pleural effusion. 6. Small left inguinal hernia with the proximal sigmoid colon noted in the region of the internal ring. No evidence of obstruction. Aortic aneurysm NOS (ICD10-I71.9). Electronically Signed   By: Worthy Heads M.D.   On: 06/28/2023 02:17    EKG: Independently reviewed.  Atrial fibrillation 65 bpm with LAFB.  Assessment/Plan Principal Problem:   Acute GI bleeding Active Problems:   Essential hypertension   Moderate cognitive impairment   End stage renal failure on dialysis [facility])   Atherosclerosis of coronary artery without angina pectoris   Benign prostatic hyperplasia   Mixed hyperlipidemia   AAA (abdominal aortic aneurysm) (HCC)   Atrial fibrillation with controlled ventricular response (HCC)   S/P AAA repair   Acute anemia    Acute blood loss anemia secondary to rectal bleed - Ordered for 2 unit PRBC transfusion with hemoglobin currently 6.9 and baseline - Noted to have recent acute retroperitoneal hematoma and required PRBC transfusion at that  point - Transfusion threshold is 8 - Not currently on any anticoagulation, however is on DAPT given history of CAD/CABG and recent AAA repair - Monitor closely and hold DAPT - GI evaluation appreciated, case discussed with Dr. Yvone Herd - Keep n.p.o. except sips with meds and PPI IV twice daily  Hyponatremia acute on chronic - Per nephrology with hemodialysis - Likely due to volume overload with missed hemodialysis sessions - Continue to monitor  ESRD on HD with hyperkalemia - Dialyzes MWF and states that his last dialysis session was 5/12 due to lack of transportation - Consulted nephrology for maintenance hemodialysis - Patient has received D50 and insulin  in ED - Continue to follow-up and nephrology aware for the need for urgent hemodialysis given current PRBC transfusion and hyperkalemia - Monitor on telemetry  Paroxysmal atrial fibrillation with recurrent RVR - Continue amiodarone  with plan to transition to 200 mg daily per cardiology note - Not a candidate for anticoagulation  HFmrEF -Last 2D echocardiogram EF 40% with moderate aortic stenosis and grade 2 diastolic dysfunction - Volume management per hemodialysis - Continue Toprol  XL 25 mg daily - Does not currently appear to be in any overt heart failure  History of CAD/CABG - Hold aspirin  and Plavix  for now given GI bleed - Continue statin  AAA with retroperitoneal hematoma - Status post EVAR 5/7 per vascular surgery Dr. Fulton Job - Hold aspirin  and Plavix  for now given GI bleed  Essential hypertension - Continue Toprol -XL  Major neurocognitive disorder/confusion - Continue Namenda  - Consider consultation with palliative for further goals of care discussion  Dyslipidemia - Continue statin  History of prostate cancer/BPH - Status post seed implantation, follows with Dr. Claretta Croft - Continue Flomax    DVT prophylaxis: SCDs Code Status: Full Family Communication: None at bedside Disposition Plan:Transfer [facility] for  further eval Consults called:GI, Nephro, Vascular Admission status: Inpt, Tele  Severity of Illness: The appropriate patient status for this patient is INPATIENT. Inpatient status is judged to be reasonable and necessary in order to provide the required intensity of service to ensure the patient's safety. The patient's presenting symptoms, physical exam findings, and initial radiographic and laboratory data in the context of their chronic comorbidities is felt to place them at high risk for further clinical deterioration. Furthermore, it is not anticipated that the patient will be medically stable for discharge from the hospital within 2 midnights of  admission.   * I certify that at the point of admission it is my clinical judgment that the patient will require inpatient hospital care spanning beyond 2 midnights from the point of admission due to high intensity of service, high risk for further deterioration and high frequency of surveillance required.*   Nakeita Styles D Sherria Riemann DO [facility]  If 7PM-7AM, please contact night-coverage [email or web address]  06/28/2023, 7:39 AM

## 2023-06-28 NOTE — Consult Note (Signed)
 [facility] [place]  ASSESSMENT / PLAN: 83 y.o. male with acute lower intestinal GI bleeding.  He is status post recent physician modified endograft repair of a 13 cm abdominal aortic aneurysm by Dr. Fulton Job on 06/17/2023.   Recent CT scan done at [facility] was read as concerning for possible ischemic colitis.  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  The superior mesenteric artery on serial CT scans is widely patent.  The scan is somewhat limited by lack of arterial phase contrast, but it does show patency of the superior mesenteric artery.  No role for intervention from a vascular standpoint.  We will follow along closely.  CHIEF COMPLAINT: Bloody bowel movements  HISTORY OF PRESENT ILLNESS: Ronald [name] is a 83 y.o. male admitted to the internal medicine service for melena.  The patient presented to [facility] complaining of the same.  Workup was initiated he was found to be anemic.  CT scan of the abdomen was performed with venous phase contrast.  CT scan revealed a patent modified endovascular graft repair of the aorta with patency of the superior mesenteric artery.  There was concern for ischemic colitis in the sigmoid colon.  Vascular surgery consultation was requested because of recent endovascular repair of large aneurysm.  Past Medical History:  Diagnosis Date   CHF (congestive heart failure) (HCC)    ESRD on hemodialysis (HCC)    GERD (gastroesophageal reflux disease)    Glaucoma    Hypertension    Macular degeneration    Myocardial infarction [facility])    1999   Prostate cancer (HCC) 02/2018   Stroke (HCC)    no deficits; had stroke while trying to place "stents in legs".    Past Surgical History:  Procedure Laterality Date   ABDOMINAL AORTIC ANEURYSM REPAIR  2012   IN [place]     ABDOMINAL AORTIC ENDOVASCULAR STENT GRAFT N/A 06/17/2023   Procedure: INSERTION, ENDOVASCULAR STENT GRAFT, AORTA, ABDOMINAL;  Surgeon: Young Hensen, MD;  [facility]: [facility] OR;  Service: Vascular;  Laterality: N/A;   APPENDECTOMY     AV FISTULA PLACEMENT Left 10/16/2020   Procedure: LEFT ARM ARTERIOVENOUS (AV) FISTULA CREATION;  Surgeon: Mayo Speck, MD;  Location: [facility] ORS;  Service: Vascular;  Laterality: Left;   CORONARY ARTERY BYPASS GRAFT  01/1998   3 vessels 18 years ago   ENDARTERECTOMY FEMORAL Right 06/17/2023   Procedure: ENDARTERECTOMY, COMMON FEMORAL AND SUPERFICIAL FEMORAL;  Surgeon: Young Hensen, MD;  Location: [facility] OR;  Service: Vascular;  Laterality: Right;   INGUINAL HERNIA REPAIR Right 09/20/2015   Procedure: REPAIR RIGHT INGUINAL HERNIA  WITH MESH;  Surgeon: Franki Isles, MD;  Location: [facility] ORS;  Service: General;  Laterality: Right;   INSERTION OF DIALYSIS CATHETER Right 08/02/2020   Procedure: INSERTION OF RIGHT TUNNELED INTERNAL JUGULAR  DIALYSIS CATHETER;  Surgeon: Mayo Speck, MD;  Location: [facility] ORS;  Service: Vascular;  Laterality: Right;   INSERTION OF ILIAC STENT Right 06/17/2023   Procedure: INSERTION, STENT, ARTERY, ILIAC;  Surgeon: Young Hensen, MD;  Location: [facility] OR;  Service: Vascular;  Laterality: Right;   LOWER EXTREMITY ANGIOGRAM Right 06/17/2023   Procedure: [employer], LOWER EXTREMITY;  Surgeon: Young Hensen, MD;  Location: [facility] OR;  Service: Vascular;  Laterality: Right;   PATCH ANGIOPLASTY Right 06/17/2023   Procedure: ANGIOPLASTY, USING PATCH GRAFT  USING Angelyn Barges;  Surgeon: Young Hensen, MD;  Location: [facility] OR;  Service: Vascular;  Laterality: Right;   REMOVAL OF A DIALYSIS CATHETER N/A 03/19/2021   Procedure: MINOR REMOVAL OF A TUNNELED DIALYSIS CATHETER;  Surgeon: Mayo Speck, MD;  Location: [facility] ORS;  Service: Vascular;  Laterality: N/A;   tumor removal from bladder     ULTRASOUND GUIDANCE FOR VASCULAR ACCESS Bilateral 06/17/2023    Procedure: ULTRASOUND GUIDANCE, FOR VASCULAR ACCESS;  Surgeon: Young Hensen, MD;  Location: [facility] OR;  Service: Vascular;  Laterality: Bilateral;    Family History  Problem Relation Age of Onset   Cancer Father     Social History   Socioeconomic History   Marital status: Married    Spouse name: Not on file   Number of children: Not on file   Years of education: Not on file   Highest education level: Not on file  Occupational History   Not on file  Tobacco Use   Smoking status: Former    Current packs/day: 0.50    Average packs/day: 0.5 packs/day for 65.9 years (32.9 ttl pk-yrs)    Types: Cigarettes    Start date: 08/07/1957   Smokeless tobacco: Never   Tobacco comments:    on chantix now  Vaping Use   Vaping status: Never Used  Substance and Sexual Activity   Alcohol use: No   Drug use: No   Sexual activity: Yes  Other Topics Concern   Not on file  Social History Narrative   Right handed   Drinks caffeine   One story home   Social Drivers of Health   Financial Resource Strain: Low Risk  (01/07/2023)   Overall Financial Resource Strain (CARDIA)    Difficulty of Paying Living Expenses: Not hard at all  Food Insecurity: Patient Declined (06/18/2023)   Hunger Vital Sign    Worried About Running Out of Food in the Last Year: Patient declined    Ran Out of Food in the Last Year: Patient declined  Transportation Needs: Patient Declined (06/18/2023)   PRAPARE - Administrator, Civil Service (Medical): Patient declined    Lack of Transportation (Non-Medical): Patient declined  Physical Activity: Unknown (11/10/2017)   [facility]   Exercise Vital Sign    Days of Exercise per Week: Patient declined    Minutes of Exercise per Session: Patient declined  Stress: No Stress Concern Present (01/07/2023)   [employer] of Occupational Health - Occupational Stress Questionnaire    Feeling of Stress : Only a little  Social  Connections: Patient Declined (06/18/2023)   Social Connection and Isolation Panel [NHANES]    Frequency of Communication with Friends and Family: Patient declined    Frequency of Social Gatherings with Friends and Family: Patient declined    Attends Religious Services: Patient declined    Database administrator or Organizations: Patient declined    Attends Banker Meetings: Patient declined    Marital Status: Patient declined  Intimate Partner Violence: Patient Declined (06/18/2023)   Humiliation, Afraid, Rape, and Kick questionnaire    Fear of Current or Ex-Partner: Patient declined    Emotionally Abused: Patient declined    Physically Abused: Patient declined    Sexually Abused: Patient declined    No Known Allergies  Current Facility-Administered Medications  Medication Dose Route Frequency Provider Last Rate Last Admin   0.9 %  sodium chloride  infusion (Manually program via Guardrails IV Fluids)  Intravenous Once Mason Sole, Pratik D, DO       acetaminophen  (TYLENOL ) tablet 650 mg  650 mg Oral Q6H PRN Mason Sole, Pratik D, DO       Or   acetaminophen  (TYLENOL ) suppository 650 mg  650 mg Rectal Q6H PRN Mason Sole, Pratik D, DO       amiodarone  (PACERONE ) tablet 200 mg  200 mg Oral Daily Mason Sole, Pratik D, DO   200 mg at 06/28/23 1056   atorvastatin  (LIPITOR) tablet 80 mg  80 mg Oral Daily Mason Sole, Pratik D, DO   80 mg at 06/28/23 1056   calcium  acetate (PHOSLO ) capsule 1,334 mg  1,334 mg Oral TID WC Shah, Pratik D, DO       calcium  gluconate 1 g/ 50 mL sodium chloride  IVPB  1 g Intravenous Once Gonfa, Taye T, MD       [START ON 06/29/2023] Chlorhexidine  Gluconate Cloth 2 % PADS 6 each  6 each Topical Q0600 Chucky Craver, MD       HYDROmorphone  (DILAUDID ) injection 0.5 mg  0.5 mg Intravenous Q3H PRN Gonfa, Taye T, MD       mirtazapine  (REMERON ) tablet 7.5 mg  7.5 mg Oral QHS Gonfa, Taye T, MD       ondansetron  (ZOFRAN ) tablet 4 mg  4 mg Oral Q6H PRN Mason Sole, Pratik D, DO       Or   ondansetron   (ZOFRAN ) injection 4 mg  4 mg Intravenous Q6H PRN Mason Sole, Pratik D, DO       pantoprazole  (PROTONIX ) injection 40 mg  40 mg Intravenous Q12H Shah, Pratik D, DO   40 mg at 06/28/23 1056   piperacillin-tazobactam (ZOSYN) IVPB 2.25 g  2.25 g Intravenous Q8H Gonfa, Taye T, MD       sodium zirconium cyclosilicate  (LOKELMA ) packet 10 g  10 g Oral TID Chucky Craver, MD       tamsulosin  (FLOMAX ) capsule 0.4 mg  0.4 mg Oral QPC supper Mason Sole, Pratik D, DO       zolpidem  (AMBIEN ) tablet 5 mg  5 mg Oral QHS PRN Carry Clapper T, MD        PHYSICAL EXAM Vitals:   06/28/23 0700 06/28/23 0730 06/28/23 0920 06/28/23 1244  BP: 120/66 119/66 (!) 108/54 121/72  Pulse: 72 72 88 81  Resp: 15 (!) 21 17 20   Temp:   (!) 97.5 F (36.4 C) 98.1 F (36.7 C)  TempSrc:   Oral Oral  SpO2: 100% 97% 100% 93%    Elderly man in no distress Regular rate and rhythm Unlabored breathing Tender abdomen left upper quadrant Right groin indurated, consistent with known hematoma No signs of skin compromise.  The incision is clean dry and intact Right leg is well-perfused  PERTINENT LABORATORY AND RADIOLOGIC DATA  Most recent CBC    Latest Ref Rng & Units 06/28/2023    1:51 PM 06/28/2023    1:02 AM 06/23/2023    9:01 AM  CBC  WBC 4.0 - 10.5 K/uL 26.8  13.0  7.1   Hemoglobin 13.0 - 17.0 g/dL 8.0  6.9  8.7   Hematocrit 39.0 - 52.0 % 23.1  20.5  26.5   Platelets 150 - 400 K/uL 243  293  174      Most recent CMP    Latest Ref Rng & Units 06/28/2023    1:51 PM 06/28/2023    1:02 AM 06/23/2023    9:22 AM  CMP  Glucose 70 - 99 mg/dL 161  096  93   BUN 8 - 23 mg/dL 403  86  62   Creatinine 0.61 - 1.24 mg/dL 47.42  59.56  38.75   Sodium 135 - 145 mmol/L 124  123  133   Potassium 3.5 - 5.1 mmol/L 5.8  6.5  4.0   Chloride 98 - 111 mmol/L 84  85  93   CO2 22 - 32 mmol/L 20  22  22    Calcium  8.9 - 10.3 mg/dL 7.8  7.7  8.2   Total Protein 6.5 - 8.1 g/dL  5.4    Total Bilirubin 0.0 - 1.2 mg/dL  1.0    Alkaline Phos 38 - 126  U/L  90    AST 15 - 41 U/L  17    ALT 0 - 44 U/L  5      Renal function Estimated Creatinine Clearance: 4.9 mL/min (A) (by C-G formula based on SCr of 13.71 mg/dL (H)).  No results found for: "HGBA1C"  LDL Cholesterol  Date Value Ref Range Status  11/14/2022 80 0 - 99 mg/dL Final    Comment:           Total Cholesterol/HDL:CHD Risk Coronary Heart Disease Risk Table                     Men   Women  1/2 Average Risk   3.4   3.3  Average Risk       5.0   4.4  2 X Average Risk   9.6   7.1  3 X Average Risk  23.4   11.0        Use the calculated Patient Ratio above and the CHD Risk Table to determine the patient's CHD Risk.        ATP III CLASSIFICATION (LDL):  <100     mg/dL   Optimal  643-329  mg/dL   Near or Above                    Optimal  130-159  mg/dL   Borderline  518-841  mg/dL   High  >660     mg/dL   Very High Performed at [facility], [address] [place], [place] [address]     CT scan of the abdomen pelvis 06/27/2023.  Personally reviewed in detail.  The physician modified EVAR is widely patent.  The superior mesenteric artery stenting is widely patent.  Right retroperitoneal hematoma and right groin hematoma appear improved.   Ronald Little. Edgardo Goodwill, MD [facility] Phone Number: [phone] 06/28/2023 2:35 PM   Total time spent on preparing this encounter including chart review, data review, collecting history, examining the patient, and coordinating care: 60 minutes  Portions of this report may have been transcribed using voice recognition software.  Every effort has been made to ensure accuracy; however, inadvertent computerized transcription errors may still be present.

## 2023-06-28 NOTE — Progress Notes (Signed)
 PHARMACY ANTIBIOTIC CONSULT NOTE   Ronald [name] a 83 y.o. male p/w acute GIB, now with c/f sepsis . S/P modification of endograft repair per [facility]VS 5/7.  Pharmacy has been consulted for Vancomycin dosing.  ESRD on HD Mon/Fri PTA- missed HD sessions, planning to receive HD the evening of 5/18 per nephro note.  Vital Signs: afebrile, VSS- elevation in WBC 13>>26.8 following multiple units of PRBCs   Estimated Creatinine Clearance: 4.9 mL/min (A) (by C-G formula based on SCr of 13.71 mg/dL (H)).  Plan: Zosyn 2.25 g IV Q8H per MD  GIVE Vancomycin 2,000 mg IV x1  Then Vancomycin variable dosing d/t variation in HD schedule  Once on HD reliably- will receive 1000 mg IV QHD  F/U HD schedule/tolerability, clinical status, de-escalation, C/S, levels as indicated   Allergies:  No Known Allergies  There were no vitals filed for this visit.     Latest Ref Rng & Units 06/28/2023    1:51 PM 06/28/2023    1:02 AM 06/23/2023    9:01 AM  CBC  WBC 4.0 - 10.5 K/uL 26.8  13.0  7.1   Hemoglobin 13.0 - 17.0 g/dL 8.0  6.9  8.7   Hematocrit 39.0 - 52.0 % 23.1  20.5  26.5   Platelets 150 - 400 K/uL 243  293  174     Antibiotics Given (last 72 hours)     None       Antimicrobials this admission: Zosyn 5/18>> Vanc 5/18>>  Microbiology results: 4/30 MRSA PCR: negative, MSSA positive   Thank you for allowing pharmacy to be a part of this patient's care.  Chrystie Crass, PharmD Clinical Pharmacist  06/28/2023 3:46 PM

## 2023-06-28 NOTE — Plan of Care (Signed)
  Problem: Clinical Measurements: Goal: Ability to maintain clinical measurements within normal limits will improve Outcome: Progressing Goal: Will remain free from infection Outcome: Progressing   Problem: Pain Managment: Goal: General experience of comfort will improve and/or be controlled Outcome: Progressing   Problem: Safety: Goal: Ability to remain free from injury will improve Outcome: Progressing   Problem: Skin Integrity: Goal: Risk for impaired skin integrity will decrease Outcome: Progressing

## 2023-06-28 NOTE — Consult Note (Addendum)
 Renal Service Consult Note [facility]  Ronald [name] 06/28/2023 Ronald Sandifer, MD Requesting Physician: Dr. Mae Schlossman   Reason for Consult: ESRD pt w/ bloody stools, gen'd weakness, missed HD HPI: The patient is a 83 y.o. year-old w/ PMH as below who presented to [facility] ED early this morning complaining of less than 1 day of bloody stool, generalized weakness and missed dialysis.  In ED hemoglobin 6.9, K+ 6.5, WBC 13K, sodium 123, creatinine 12.9.  He was started on transfusion of PRBCs and given some D50 and insulin  for high potassium.  CT of the abdomen showed some findings of mild ischemic colitis possibly.  Case was discussed with GI at [facility] and patient was transferred to [facility]  We are asked to see for dialysis   Pt seen in room.  Difficult to understand no interpreter available.   ROS - n/a  PMH: CHF Glaucoma Hypertension Macular degeneration CAD history of MI History of prostate cancer ESRD on HD History of stroke  Past Surgical History  Past Surgical History:  Procedure Laterality Date   ABDOMINAL AORTIC ANEURYSM REPAIR  2012   IN [place]    ABDOMINAL AORTIC ENDOVASCULAR STENT GRAFT N/A 06/17/2023   Procedure: INSERTION, ENDOVASCULAR STENT GRAFT, AORTA, ABDOMINAL;  Surgeon: Young Hensen, MD;  [facility] OR;  Service: Vascular;  Laterality: N/A;   APPENDECTOMY     AV FISTULA PLACEMENT Left 10/16/2020   Procedure: LEFT ARM ARTERIOVENOUS (AV) FISTULA CREATION;  Surgeon: Mayo Speck, MD;  Location: [facility] ORS;  Service: Vascular;  Laterality: Left;   CORONARY ARTERY BYPASS GRAFT  01/1998   3 vessels 18 years ago   ENDARTERECTOMY FEMORAL Right 06/17/2023   Procedure: ENDARTERECTOMY, COMMON FEMORAL AND SUPERFICIAL FEMORAL;  Surgeon: Young Hensen, MD;  Location: [facility] OR;  Service: Vascular;  Laterality: Right;   INGUINAL HERNIA REPAIR Right 09/20/2015   Procedure: REPAIR RIGHT INGUINAL HERNIA  WITH MESH;  Surgeon: Franki Isles,  MD;  Location: [facility] ORS;  Service: General;  Laterality: Right;   INSERTION OF DIALYSIS CATHETER Right 08/02/2020   Procedure: INSERTION OF RIGHT TUNNELED INTERNAL JUGULAR  DIALYSIS CATHETER;  Surgeon: Mayo Speck, MD;  Location: [facility] ORS;  Service: Vascular;  Laterality: Right;   INSERTION OF ILIAC STENT Right 06/17/2023   Procedure: INSERTION, STENT, ARTERY, ILIAC;  Surgeon: Young Hensen, MD;  Location: [facility] OR;  Service: Vascular;  Laterality: Right;   LOWER EXTREMITY ANGIOGRAM Right 06/17/2023   Procedure: [employer], LOWER EXTREMITY;  Surgeon: Young Hensen, MD;  Location: [facility] OR;  Service: Vascular;  Laterality: Right;   PATCH ANGIOPLASTY Right 06/17/2023   Procedure: ANGIOPLASTY, USING PATCH GRAFT USING Angelyn Barges;  Surgeon: Young Hensen, MD;  Location: [facility] OR;  Service: Vascular;  Laterality: Right;   REMOVAL OF A DIALYSIS CATHETER N/A 03/19/2021   Procedure: MINOR REMOVAL OF A TUNNELED DIALYSIS CATHETER;  Surgeon: Mayo Speck, MD;  Location: [facility] ORS;  Service: Vascular;  Laterality: N/A;   tumor removal from bladder     ULTRASOUND GUIDANCE FOR VASCULAR ACCESS Bilateral 06/17/2023   Procedure: ULTRASOUND GUIDANCE, FOR VASCULAR ACCESS;  Surgeon: Young Hensen, MD;  Location: [facility] OR;  Service: Vascular;  Laterality: Bilateral;   Family History  Family History  Problem Relation Age of Onset   Cancer Father    Social History  reports that he has quit smoking. His smoking use included cigarettes. He started smoking about 65 years ago. He has a 32.9 pack-year  smoking history. He has never used smokeless tobacco. He reports that he does not drink alcohol and does not use drugs. Allergies No Known Allergies Home medications Prior to Admission medications   Medication Sig Start Date End Date Taking? Authorizing Provider  amiodarone  (PACERONE ) 200 MG tablet Take 1 tablet (200 mg total) by mouth 2 (two) times daily for 7 days, THEN 1 tablet (200 mg total) daily for 14 days.  06/23/23 07/14/23 Yes Baglia, Corrina, PA-C  amLODipine  (NORVASC ) 5 MG tablet Take 5 mg by mouth at bedtime. 01/24/23  Yes [provider]  aspirin  EC 81 MG tablet Take 1 tablet (81 mg total) by mouth daily. Swallow whole. 11/17/22  Yes Tat, Myrtie Atkinson, MD  atorvastatin  (LIPITOR) 80 MG tablet Take 80 mg by mouth in the morning. 07/05/19  Yes [provider]  calcium  acetate (PHOSLO ) 667 MG capsule Take 1,334 mg by mouth 3 (three) times daily. citracal   Yes [provider]  carvedilol  (COREG ) 25 MG tablet Take 1 tablet (25 mg total) by mouth 2 (two) times daily with a meal. 11/17/22  Yes Tat, Myrtie Atkinson, MD  clopidogrel  (PLAVIX ) 75 MG tablet Take 1 tablet (75 mg total) by mouth daily. 06/23/23  Yes Baglia, Corrina, PA-C  metoprolol  succinate (TOPROL  XL) 50 MG 24 hr tablet Take 1 tablet (50 mg total) by mouth daily. Take with or immediately following a meal. 06/23/23 06/22/24 Yes Baglia, Corrina, PA-C  mirtazapine  (REMERON ) 15 MG tablet Take 15 mg by mouth at bedtime. 05/02/23  Yes [provider]  pantoprazole  (PROTONIX ) 40 MG tablet Take 40 mg by mouth in the morning.   Yes [provider]  tamsulosin  (FLOMAX ) 0.4 MG CAPS capsule Take 1 capsule (0.4 mg total) by mouth daily after supper. 12/24/22  Yes McKenzie, Arden Beck, MD  traMADol  (ULTRAM ) 50 MG tablet Take 50 mg by mouth every 8 (eight) hours as needed (pain.).   Yes [provider]  zolpidem  (AMBIEN ) 10 MG tablet Take 10 mg by mouth at bedtime as needed for sleep.   Yes [provider]  metoprolol  succinate (TOPROL -XL) 25 MG 24 hr tablet Take 25 mg by mouth daily. Patient not taking: Reported on 06/28/2023 06/23/23   [provider]     Vitals:   06/28/23 0700 06/28/23 0730 06/28/23 0920 06/28/23 1244  BP: 120/66 119/66 (!) 108/54 121/72  Pulse: 72 72 88 81  Resp: 15 (!) 21 17 20   Temp:   (!) 97.5 F (36.4 C) 98.1 F (36.7 C)  TempSrc:   Oral Oral  SpO2: 100% 97% 100% 93%    Exam Gen alert, no distress, a little bit restless No rash, cyanosis or gangrene Sclera anicteric, throat clear  No jvd or bruits Chest clear bilat to bases, no rales/ wheezing RRR no MRG Abd soft ntnd no mass or ascites +bs GU male MS no joint effusions or deformity Ext no LE or UE edema, no other edema Neuro is alert, Ox 3 , nf    L UA AVF + bruit   Renal-related home meds: Toprol -XL 25 daily Norvasc  5 mg at bedtime PhosLo  2 AC 3 times daily Coreg  25 twice daily Others: Ambien , Flomax , PPI, Remeron , Plavix , statin, aspirin , amiodarone     OP HD: [facility]  HD 2 days per week mon/ Friday   Need other details    Assessment/ Plan: Acute GI bleeding/ rectal bleeding: getting 2u prbc's. H/o recent RP bleed/ hematoma. Off a/c, but is on DAPT due to hx of  CABG and recent AAA repair. GI consulting.  ESRD: on HD 2x per week, Mon/Friday. Has missed 2-3 HD sessions. Plan HD tonight.  HTN: on toprol  xl, consider holding this w/ GI bleed, poss sepsis Volume: no vol excess on exam, on RA. Follow.  Anemia of esrd: Hb 6.9 on admission, getting prbc's per pmd. Follow.  Secondary hyperparathyroidism: CCa in range, phos up a bit. Cont phoslo  ac. Recent AAA repair Mild cognitive impairment Atrial fib: is on BB, not an a/c candidate per pmd Chronic syst HF: last echo EF 40%, G2DD       Larry Poag  MD [facility] 06/28/2023, 1:58 PM  Recent Labs  Lab 06/23/23 0901 06/23/23 0922 06/28/23 0102  HGB 8.7*  --  6.9*  ALBUMIN   --  2.3* 2.4*  CALCIUM   --  8.2* 7.7*  PHOS  --  6.9*  --   CREATININE  --  10.66* 12.99*  K  --  4.0 6.5*   Inpatient medications:  sodium chloride    Intravenous Once   amiodarone   200 mg Oral Daily   atorvastatin   80 mg Oral Daily   calcium  acetate  1,334 mg Oral TID WC   dextrose        metoprolol  succinate  50 mg Oral Daily   mirtazapine   7.5 mg Oral QHS   pantoprazole  (PROTONIX ) IV  40 mg Intravenous Q12H   sodium zirconium cyclosilicate   10 g  Oral TID   tamsulosin   0.4 mg Oral QPC supper    acetaminophen  **OR** acetaminophen , dextrose , HYDROmorphone  (DILAUDID ) injection, ondansetron  **OR** ondansetron  (ZOFRAN ) IV, zolpidem 

## 2023-06-28 NOTE — ED Notes (Signed)
 [facility] called for report on patient.

## 2023-06-28 NOTE — Progress Notes (Signed)
 Briefly,  83 year old M with PMH of CAD/CABG, HFmrEF, ESRD on HD, CVA, HTN, neurocognitive disorder, A-fib not on AC, AAA with retroperitoneal hematoma s/p EVAR on 5/17 by Dr. Fulton Job presented to [facility] ED with rectal bleed.   In ED, stable vitals.  Hgb 6.9 (8.7 on 5/13).  K6.5.  WBC 13k. Na 123.  Cr 13.  BUN 86.  CT angio raise concern for ischemic colitis.  Lactic acid 1.1>> 1.8.  Two units of blood ordered.  Nephrology, GI and vascular surgery consulted, and patient was transferred to [facility] for further evaluation.  On arrival here, vitals remained stable.  Confused and delirious.  K improved to 5.8.  BUN 104.  Bicarb 20.  AG 20.  Na 124.  WBC trended up to 26.8.  Hgb improved to 8.0 to 2 units.  Evaluated by GI, vascular and nephrology.  Objective Vitals:   06/28/23 0700 06/28/23 0730 06/28/23 0920 06/28/23 1244  BP: 120/66 119/66 (!) 108/54 121/72  Pulse: 72 72 88 81  Resp: 15 (!) 21 17 20   Temp:   (!) 97.5 F (36.4 C) 98.1 F (36.7 C)  TempSrc:   Oral Oral  SpO2: 100% 97% 100% 93%    Physical exam GENERAL: No apparent distress.  Nontoxic. HEENT: MMM.  Vision and hearing grossly intact.  NECK: Supple.  No apparent JVD.  RESP:  No IWOB.  Fair aeration bilaterally. CVS:  RRR. Heart sounds normal.  AV fistula left forearm ABD/GI/GU: BS+. Abd soft, NTND.  MSK/EXT:   No apparent deformity.  BLE edema, right> left.  Difficult to palpate DP and PT pulses. SKIN: Skin bruises on upper extremities. NEURO: AA.  Oriented only to self.  Follows some commands.  No apparent focal neuro deficit. PSYCH: Calm. Normal affect.   Assessment and plan Leukocytosis: WBC trended from 13-26.  Not toxic.  Hemodynamically stable.  Lactic acid 1.1> 1.8.  At risk for intra-abdominal infection given GI bleed.  Also at risk for bacteremia given recent vascular procedure and intra-abdominal bleed.  CT abdomen and pelvis as above - Blood culture and broad-spectrum antibiotics - Low threshold to escalate  care to progressive ICU  ESRD/azotemia/hyponatremia/hyperkalemia: Hyperkalemia improved. -IV calcium  gluconate 1 g x 1.   -Nephrology ordered Lokelma .   -Nephrology to dialyze -Continue telemetry monitoring  ABLA and GI bleed  -Continue holding Plavix  and aspirin . -Hgb improved to 8.0 after 2 units. -GI recommended IV PPI for possible upper GI bleed.   -EGD at some point during this hospitalization.   -Will monitor H&H every 6 hours.  AAA with retroperitoneal hematoma s/p EVAR on 5/17 by Dr. Fulton Job.  CT raises concern for ischemic colitis.  Lactic acid 1.1 and 1.8.  LLQ tenderness on exam.  No rebound or guarding. -No intervention from vascular standpoint.  They will follow closely. -Antibiotics as above pending cultures.  Confusion/delirium: Has underlying cognitive impairment.  Awake and alert.  Only oriented to self.  Follows some commands.  No focal neurodeficit but limited exam since patient cannot follow command consistently. - Reorientation and delirium precaution - Minimize sedating medications.  Decrease Dilaudid  and Ambien . - Safety sitter if available -Abdominal binder - Fall precaution  Lower extremity edema, right> left. - Check lower extremity venous Doppler  See H&P from this morning for more   CRITICAL CARE Performed by: Theadore Finger   Total critical care time: 55 minutes  Critical care time was exclusive of separately billable procedures and treating other patients.  Critical care was  necessary to treat or prevent imminent or life-threatening deterioration.  Critical care was time spent personally by me on the following activities: development of treatment plan with patient and/or surrogate as well as nursing, discussions with consultants, evaluation of patient's response to treatment, examination of patient, obtaining history from patient or surrogate, ordering and performing treatments and interventions, ordering and review of laboratory studies, ordering  and review of radiographic studies, pulse oximetry and re-evaluation of patient's condition.

## 2023-06-28 NOTE — ED Triage Notes (Addendum)
 Pt from home via [facility] c/o dark tarry stools x 1.h hours. Pt arrives in bowel incontinence. He c/o weakness and points to his genital area when asked if he is in pain. He has multiple bruises all over body. Fairly new healing incision to right lower abdomen. Pt is a poor historian. He is alert and oriented to self. BP en route was  110/54 pulse 60's Spo2 90% RA. Kellogg RN

## 2023-06-28 NOTE — ED Notes (Addendum)
 Date and time results received: 06/28/23 0154   Test: Potassium Critical Value: 6.5  Name of Provider Notified: [facility]  Orders Received? Or Actions Taken?: MD informed.   Kellogg RN

## 2023-06-28 NOTE — Consult Note (Signed)
 Consultation Note   Referring Provider:  [facility] PCP: Omie Bickers, MD Primary Gastroenterologist:  Seen by GI in [place] in 2020      Reason for Consultation:  DOA: 06/27/2023         Hospital Day: 2   ASSESSMENT     83 yo male transferred from [facility] ED for GI bleed. Recent endovascular repair of AAA presenting with GI bleed on plavix  and ABLA. Passing maroon blood and per wife was having massive amount of very dark emesis yesterday  CT scan done at [facility] showed an 11.3 cm infrarenal abdominal aortic aneurysm status post endograft repair.  No active extravasation. Scan reviewed by Vascular Surgery at [facility] and no emergent surgical needs. Rule out PUD, Dieulafoy lesions, other.  Doesn't seem to be related to endograft. Hemoglobin 6.9, down from 8.7 several days ago. Getting transfused.   Recent endovascular repair of AAA On Plavix  plus baby aspirin  daily  Recent retroperitoneal bleed.  Size decreased on today's CT scan   Possible ischemic colitis.  CT scan showing thickening and mild pericolonic inflammatory stranding involving the descending and proximal sigmoid colon Suspicious  A-fib with RVR.  Not on Anticoagulation due to confusion and fall risk  ESRD on HD   Hyperkalemia  See PMH for additional history  Principal Problem:   Acute GI bleeding Active Problems:   Essential hypertension   Moderate cognitive impairment   End stage renal failure on dialysis [facility])   Atherosclerosis of coronary artery without angina pectoris   Benign prostatic hyperplasia   Mixed hyperlipidemia   AAA (abdominal aortic aneurysm) (HCC)   Atrial fibrillation with controlled ventricular response (HCC)   S/P AAA repair   Acute anemia     PLAN:   --Continue BID IV pantoprazole  --Monitor H/H, blood transfusion in progress --He will need and EGD after being dialyzed. Hasn't had dialysis since 5/12. He is  hyperkalemic, Creatinine 12.9. I spoke over phone to wife about the EGD.  The risks and benefits of EGD with possible biopsies were discussed with the patient through his wife and he agrees to proceed.   HPI   **Patient is from [place], non-English speaker. He did not want me to use an interpreter for this visit. As he wished I spoke with his wife on phone but her English had some limitations  83 y.o. year old male with a medical history including but not limited to BPH , dementia , ESRD on HD, hypertension, CAD/CABG, chronic A-fib, AAA status post recent repair.   Patient recently discharged from the hospital after undergoing  endovascular repair of an AAA with stent and angioplasty on 06/17/2023 at [facility] .  Hospitalization complicated by retroperitoneal bleed and acute blood loss anemia.   He is on Plavix  and aspirin  per vascular surgery.    [facility] ED this a.m. for evaluation of GI bleeding. Per wife, yesterday patient began passing large amounts of very dark, almost black stool.  He subsequently had multiple episodes of very dark emesis.  He denies abdominal pain.  He has no prior history of GI bleeds.   Labs and Imaging:  Recent Labs    06/28/23 0102  PROT 5.4*  ALBUMIN  2.4*  AST 17  ALT 5  ALKPHOS 90  BILITOT 1.0   Recent Labs    06/28/23 0102  WBC 13.0*  HGB 6.9*  HCT 20.5*  MCV 88.4  PLT 293   Recent Labs    06/28/23 0102  NA 123*  K 6.5*  CL 85*  CO2 22  GLUCOSE 137*  BUN 86*  CREATININE 12.99*  CALCIUM  7.7*     CT ABDOMEN PELVIS W CONTRAST CLINICAL DATA:  Acute nonlocalized abdominal pain  EXAM: CT ABDOMEN AND PELVIS WITH CONTRAST  TECHNIQUE: Multidetector CT imaging of the abdomen and pelvis was performed using the standard protocol following bolus administration of intravenous contrast.  RADIATION DOSE REDUCTION: This exam was performed according to the departmental dose-optimization program which includes  automated exposure control, adjustment of the mA and/or kV according to patient size and/or use of iterative reconstruction technique.  CONTRAST:  OMNIPAQUE  IOHEXOL  300 MG/ML  SOLN  COMPARISON:  06/19/2023  FINDINGS: Lower chest: Improving consolidation within the visualized posterobasal right lower lobe. Small right pleural effusion is stable. Cardiac size within normal limits.  Hepatobiliary: No focal liver abnormality is seen. No gallstones, gallbladder wall thickening, or biliary dilatation.  Pancreas: Unremarkable  Spleen: Unremarkable  Adrenals/Urinary Tract: The adrenal glands are unremarkable. The kidneys are markedly atrophic bilaterally. Multiple simple cortical cysts are seen within the kidneys bilaterally for which no follow-up imaging is recommended. 2.4 cm hyperdense lesion is seen within the lower pole the left kidney which is indeterminate but demonstrates approximately 5 mm interval increase in size since more remote prior examination of 03/25/2022. The kidneys are otherwise unremarkable. Bladder unremarkable.  Stomach/Bowel: There is diffuse mural thickening and mild pericolonic inflammatory stranding involving descending and proximal sigmoid colon suspicious for changes of ischemic colitis though a long segment infectious or inflammatory colitis could appear similarly. The stomach, small bowel, and large bowel are otherwise unremarkable. Appendix absent. No free intraperitoneal gas or fluid. No evidence of obstruction.  Vascular/Lymphatic: Endograft repair of an infrarenal abdominal aortic aneurysm is again noted with extension of the proximal landing zone cranially with partial coverage of the origin of the superior mesenteric artery best seen on sagittal image # 84/4 the bifurcated stent graft demonstrates distal landing zones within the left distal common iliac artery just above the bifurcation and appears to extend into the right external iliac  artery, unchanged from prior examination. The aneurysm sac is unchanged measuring 11.1 x 11.3 cm at axial image # 48/2 in the region of the contained leak along the left posterolateral margin aneurysm sac.  There is extensive atherosclerotic calcification within the proximal and mid superior mesenteric artery without evidence of hemodynamically significant stenosis on this non arteriographic study. Patency of the inferior mesenteric artery is not well assessed on this non arteriographic study. Stable hematoma within the right inguinal region superficial to the right common femoral artery. Hematoma within the right retroperitoneum appears decreased in size since prior examination. No active extravasation identified.  No pathologic adenopathy within the abdomen and pelvis.  Reproductive: Fiducial markers noted within prostate gland. Mild prostatic hypertrophy.  Other: Mild diffuse subcutaneous body wall edema. Small left inguinal hernia again noted with the proximal sigmoid colon noted in the region of the internal ring.  Musculoskeletal: No acute bone abnormality. Degenerative changes are seen within the lumbar spine.  IMPRESSION: 1. Diffuse mural thickening and mild pericolonic inflammatory stranding involving the descending and proximal sigmoid colon suspicious for changes of ischemic colitis though a long  segment infectious or inflammatory colitis could appear similarly. Superior mesenteric artery is patent though diseased with partial coverage of the origin by the proximally extended endovascular stent graft. Inferior mesenteric artery patency is not well assessed on this non arteriographic examination. No evidence of obstruction or perforation. 2. 11.3 cm infrarenal abdominal aortic aneurysm status post endograft repair. Stable size of the contained leak along the left posterolateral margin of the aneurysm sac. No active extravasation identified. 3. Stable right inguinal  hematoma superficial to the right common femoral artery. Decreasing right retroperitoneal hematoma. No active extravasation identified. 4. 2.4 cm indeterminate hyperdense lesion within the lower pole the left kidney demonstrating approximately 5 mm interval increase in size since more remote prior examination of 03/25/2022. While this may represent a hemorrhagic or proteinaceous cyst, follow-up multiphasic CT or MRI examination is recommended for further evaluation. 5. Improving consolidation within the visualized posterobasal right lower lobe. Stable small right pleural effusion. 6. Small left inguinal hernia with the proximal sigmoid colon noted in the region of the internal ring. No evidence of obstruction.  Aortic aneurysm NOS (ICD10-I71.9).  Electronically Signed   By: Worthy Heads M.D.   On: 06/28/2023 02:17   Past Medical History:  Diagnosis Date   CHF (congestive heart failure) (HCC)    GERD (gastroesophageal reflux disease)    Glaucoma    Hypertension    Macular degeneration    Myocardial infarction [facility])    1999   Prostate cancer (HCC) 02/2018   Renal insufficiency    Stroke (HCC)    no deficits; had stroke while trying to place "stents in legs".    Past Surgical History:  Procedure Laterality Date   ABDOMINAL AORTIC ANEURYSM REPAIR  2012   IN [place]    ABDOMINAL AORTIC ENDOVASCULAR STENT GRAFT N/A 06/17/2023   Procedure: INSERTION, ENDOVASCULAR STENT GRAFT, AORTA, ABDOMINAL;  Surgeon: Young Hensen, MD;  Location: [facility] OR;  Service: Vascular;  Laterality: N/A;   APPENDECTOMY     AV FISTULA PLACEMENT Left 10/16/2020   Procedure: LEFT ARM ARTERIOVENOUS (AV) FISTULA CREATION;  Surgeon: Mayo Speck, MD;  Location: [facility] ORS;  Service: Vascular;  Laterality: Left;   CORONARY ARTERY BYPASS GRAFT  01/1998   3 vessels 18 years ago   ENDARTERECTOMY FEMORAL Right 06/17/2023   Procedure: ENDARTERECTOMY, COMMON FEMORAL AND SUPERFICIAL FEMORAL;  Surgeon: Young Hensen, MD;  Location: [facility] OR;  Service: Vascular;  Laterality: Right;   INGUINAL HERNIA REPAIR Right 09/20/2015   Procedure: REPAIR RIGHT INGUINAL HERNIA  WITH MESH;  Surgeon: Franki Isles, MD;  Location: [facility] ORS;  Service: General;  Laterality: Right;   INSERTION OF DIALYSIS CATHETER Right 08/02/2020   Procedure: INSERTION OF RIGHT TUNNELED INTERNAL JUGULAR  DIALYSIS CATHETER;  Surgeon: Mayo Speck, MD;  Location: [facility] ORS;  Service: Vascular;  Laterality: Right;   INSERTION OF ILIAC STENT Right 06/17/2023   Procedure: INSERTION, STENT, ARTERY, ILIAC;  Surgeon: Young Hensen, MD;  Location: [facility] OR;  Service: Vascular;  Laterality: Right;   LOWER EXTREMITY ANGIOGRAM Right 06/17/2023   Procedure: [employer], LOWER EXTREMITY;  Surgeon: Young Hensen, MD;  Location: [facility] OR;  Service: Vascular;  Laterality: Right;   PATCH ANGIOPLASTY Right 06/17/2023   Procedure: ANGIOPLASTY, USING PATCH GRAFT USING Angelyn Barges;  Surgeon: Young Hensen, MD;  Location: [facility] OR;  Service: Vascular;  Laterality: Right;   REMOVAL OF A DIALYSIS CATHETER N/A 03/19/2021   Procedure: MINOR REMOVAL OF A TUNNELED DIALYSIS CATHETER;  Surgeon:  Mayo Speck, MD;  Location: [facility];  Service: Vascular;  Laterality: N/A;   tumor removal from bladder     ULTRASOUND GUIDANCE FOR VASCULAR ACCESS Bilateral 06/17/2023   Procedure: ULTRASOUND GUIDANCE, FOR VASCULAR ACCESS;  Surgeon: Young Hensen, MD;  Location: [facility] OR;  Service: Vascular;  Laterality: Bilateral;    Family History  Problem Relation Age of Onset   Cancer Father     Prior to Admission medications   Medication Sig Start Date End Date Taking? Authorizing Provider  amiodarone  (PACERONE ) 200 MG tablet Take 1 tablet (200 mg total) by mouth 2 (two) times daily for 7 days, THEN 1 tablet (200 mg total) daily for 14 days. 06/23/23 07/14/23  Baglia, Corrina, PA-C  amLODipine  (NORVASC ) 5 MG tablet Take 5 mg by mouth at bedtime. 01/24/23   [provider]  aspirin  EC 81 MG tablet Take 1 tablet (81 mg total) by mouth daily. Swallow whole. 11/17/22   Demaris Fillers, MD  atorvastatin  (LIPITOR) 80 MG tablet Take 80 mg by mouth in the morning. 07/05/19   [provider]  calcium  acetate (PHOSLO ) 667 MG capsule Take 1,334 mg by mouth 3 (three) times daily. citracal    [provider]  carvedilol  (COREG ) 25 MG tablet Take 1 tablet (25 mg total) by mouth 2 (two) times daily with a meal. 11/17/22   Tat, Myrtie Atkinson, MD  ciprofloxacin  (CIPRO ) 500 MG tablet Take 500 mg by mouth daily. 05/27/23   [provider]  clopidogrel  (PLAVIX ) 75 MG tablet Take 1 tablet (75 mg total) by mouth daily. 06/23/23   Baglia, Corrina, PA-C  metoprolol  succinate (TOPROL  XL) 50 MG 24 hr tablet Take 1 tablet (50 mg total) by mouth daily. Take with or immediately following a meal. 06/23/23 06/22/24  Baglia, Corrina, PA-C  metoprolol  succinate (TOPROL -XL) 25 MG 24 hr tablet Take 25 mg by mouth daily. 06/23/23   [provider]  mirtazapine  (REMERON ) 15 MG tablet Take 15 mg by mouth at bedtime. 05/02/23   [provider]  pantoprazole  (PROTONIX ) 40 MG tablet Take 40 mg by mouth in the morning.    [provider]  tamsulosin  (FLOMAX ) 0.4 MG CAPS capsule Take 1 capsule (0.4 mg total) by mouth daily after supper. 12/24/22   McKenzie, Arden Beck, MD  traMADol  (ULTRAM ) 50 MG tablet Take 50 mg by mouth every 8 (eight) hours as needed (pain.).    [provider]  zolpidem  (AMBIEN ) 10 MG tablet Take 10 mg by mouth at bedtime as needed for sleep.    [provider]    Current Facility-Administered Medications  Medication Dose Route Frequency Provider Last Rate Last Admin   0.9 %  sodium chloride  infusion (Manually program via Guardrails IV Fluids)   Intravenous Once Mason Sole, Pratik D, DO       acetaminophen  (TYLENOL ) tablet 650 mg  650 mg Oral Q6H PRN Mason Sole, Pratik D, DO       Or   acetaminophen  (TYLENOL ) suppository 650 mg  650  mg Rectal Q6H PRN Mason Sole, Pratik D, DO       amiodarone  (PACERONE ) tablet 200 mg  200 mg Oral Daily Mason Sole, Pratik D, DO   200 mg at 06/28/23 1056   atorvastatin  (LIPITOR) tablet 80 mg  80 mg Oral Daily Mason Sole, Pratik D, DO   80 mg at 06/28/23 1056   calcium  acetate (PHOSLO ) capsule 1,334 mg  1,334 mg Oral TID WC Mason Sole, Pratik D, DO  dextrose  50 % solution            HYDROmorphone  (DILAUDID ) injection 0.5-1 mg  0.5-1 mg Intravenous Q2H PRN Mason Sole, Pratik D, DO       metoprolol  succinate (TOPROL -XL) 24 hr tablet 50 mg  50 mg Oral Daily Mason Sole, Pratik D, DO   50 mg at 06/28/23 1056   mirtazapine  (REMERON ) tablet 15 mg  15 mg Oral QHS Shah, Pratik D, DO       ondansetron  (ZOFRAN ) tablet 4 mg  4 mg Oral Q6H PRN Mason Sole, Pratik D, DO       Or   ondansetron  (ZOFRAN ) injection 4 mg  4 mg Intravenous Q6H PRN Mason Sole, Pratik D, DO       pantoprazole  (PROTONIX ) injection 40 mg  40 mg Intravenous Q12H Shah, Pratik D, DO   40 mg at 06/28/23 1056   tamsulosin  (FLOMAX ) capsule 0.4 mg  0.4 mg Oral QPC supper Mason Sole, Pratik D, DO       zolpidem  (AMBIEN ) tablet 10 mg  10 mg Oral QHS PRN Mason Sole, Pratik D, DO        Allergies as of 06/27/2023   (No Known Allergies)    Social History   Socioeconomic History   Marital status: Married    Spouse name: Not on file   Number of children: Not on file   Years of education: Not on file   Highest education level: Not on file  Occupational History   Not on file  Tobacco Use   Smoking status: Former    Current packs/day: 0.50    Average packs/day: 0.5 packs/day for 65.9 years (32.9 ttl pk-yrs)    Types: Cigarettes    Start date: 08/07/1957   Smokeless tobacco: Never   Tobacco comments:    on chantix now  Vaping Use   Vaping status: Never Used  Substance and Sexual Activity   Alcohol use: No   Drug use: No   Sexual activity: Yes  Other Topics Concern   Not on file  Social History Narrative   Right handed   Drinks caffeine   One story home   Social Drivers of  Health   Financial Resource Strain: Low Risk  (01/07/2023)   Overall Financial Resource Strain (CARDIA)    Difficulty of Paying Living Expenses: Not hard at all  Food Insecurity: Patient Declined (06/18/2023)   Hunger Vital Sign    Worried About Running Out of Food in the Last Year: Patient declined    Ran Out of Food in the Last Year: Patient declined  Transportation Needs: Patient Declined (06/18/2023)   PRAPARE - Administrator, Civil Service (Medical): Patient declined    Lack of Transportation (Non-Medical): Patient declined  Physical Activity: Unknown (11/10/2017)   Received from [facility], [facility]   Exercise Vital Sign    Days of Exercise per Week: Patient declined    Minutes of Exercise per Session: Patient declined  Stress: No Stress Concern Present (01/07/2023)   [employer] of Occupational Health - Occupational Stress Questionnaire    Feeling of Stress : Only a little  Social Connections: Patient Declined (06/18/2023)   Social Connection and Isolation Panel [NHANES]    Frequency of Communication with Friends and Family: Patient declined    Frequency of Social Gatherings with Friends and Family: Patient declined    Attends Religious Services: Patient declined    Database administrator or Organizations: Patient declined    Attends Banker  Meetings: Patient declined    Marital Status: Patient declined  Intimate Partner Violence: Patient Declined (06/18/2023)   Humiliation, Afraid, Rape, and Kick questionnaire    Fear of Current or Ex-Partner: Patient declined    Emotionally Abused: Patient declined    Physically Abused: Patient declined    Sexually Abused: Patient declined     Code Status   Code Status: Full Code  Review of Systems: All systems reviewed and negative except where noted in HPI.  Physical Exam: Vital signs in last 24 hours: Temp:  [97.4 F (36.3 C)-98.3 F (36.8 C)] 97.5 F (36.4 C) (05/18 0920) Pulse Rate:   [64-88] 88 (05/18 0920) Resp:  [11-29] 17 (05/18 0920) BP: (105-137)/(47-81) 108/54 (05/18 0920) SpO2:  [88 %-100 %] 100 % (05/18 0920)    General:  Well developed male in NAD Psych:  Cooperative.  Eyes: Pupils equal Ears:  Normal auditory acuity Nose: No deformity, discharge or lesions Neck:  Supple, no masses felt Lungs:  Clear to auscultation.  Heart:  Regular rate, regular rhythm.  Abdomen:  Soft, nondistended, nontender, active bowel sounds, no masses felt Rectal :  Deferred Msk: Symmetrical without gross deformities.  Neurologic:  Alert, unable to determine if oriented. He answers wife's questions appropriately.  Extremities : No edema Skin:  Intact without significant lesions.    Intake/Output from previous day: 05/17 0701 - 05/18 0700 In: 1695 [I.V.:50; Blood:1145; IV Piggyback:500] Out: -  Intake/Output this shift:  Total I/O In: 326 [Blood:326] Out: -    Mai Schwalbe, NP-C   06/28/2023, 12:13 PM

## 2023-06-28 NOTE — ED Provider Notes (Signed)
 [place] EMERGENCY DEPARTMENT AT [facility] Provider Note   CSN: [id number] Arrival date & time: 06/27/23  2355     History  Chief Complaint  Patient presents with   Rectal Bleeding    Ronald [name] is a 83 y.o. male.  Patient is an 83 year old male with past medical history of coronary artery disease/CABG, AAA with recent endograft surgery, paroxysmal A-fib, CHF, chronic renal insufficiency.  Patient brought by EMS for evaluation of dark stools.  From what I am told, this began just hours prior to presentation.  Patient denies to me that he is experiencing any pain.  He appears to have baseline dementia and history is limited secondary to this.       Home Medications Prior to Admission medications   Medication Sig Start Date End Date Taking? Authorizing Provider  amiodarone  (PACERONE ) 200 MG tablet Take 1 tablet (200 mg total) by mouth 2 (two) times daily for 7 days, THEN 1 tablet (200 mg total) daily for 14 days. 06/23/23 07/14/23  Baglia, Corrina, PA-C  amLODipine  (NORVASC ) 5 MG tablet Take 5 mg by mouth at bedtime. 01/24/23   [provider]  aspirin  EC 81 MG tablet Take 1 tablet (81 mg total) by mouth daily. Swallow whole. 11/17/22   Demaris Fillers, MD  atorvastatin  (LIPITOR) 80 MG tablet Take 80 mg by mouth in the morning. 07/05/19   [provider]  calcium  acetate (PHOSLO ) 667 MG capsule Take 1,334 mg by mouth 3 (three) times daily. citracal    [provider]  carvedilol  (COREG ) 25 MG tablet Take 1 tablet (25 mg total) by mouth 2 (two) times daily with a meal. 11/17/22   Tat, Myrtie Atkinson, MD  clopidogrel  (PLAVIX ) 75 MG tablet Take 1 tablet (75 mg total) by mouth daily. 06/23/23   Baglia, Corrina, PA-C  metoprolol  succinate (TOPROL  XL) 50 MG 24 hr tablet Take 1 tablet (50 mg total) by mouth daily. Take with or immediately following a meal. 06/23/23 06/22/24  Baglia, Corrina, PA-C  mirtazapine  (REMERON ) 15 MG tablet Take 15 mg by mouth at bedtime.  05/02/23   [provider]  pantoprazole  (PROTONIX ) 40 MG tablet Take 40 mg by mouth in the morning.    [provider]  tamsulosin  (FLOMAX ) 0.4 MG CAPS capsule Take 1 capsule (0.4 mg total) by mouth daily after supper. 12/24/22   McKenzie, Arden Beck, MD  traMADol  (ULTRAM ) 50 MG tablet Take 50 mg by mouth every 8 (eight) hours as needed (pain.).    [provider]  zolpidem  (AMBIEN ) 10 MG tablet Take 10 mg by mouth at bedtime as needed for sleep.    [provider]      Allergies    Patient has no known allergies.    Review of Systems   Review of Systems  All other systems reviewed and are negative.   Physical Exam Updated Vital Signs Pulse 64   Temp (!) 97.5 F (36.4 C) (Axillary)   Resp 18   SpO2 100%  Physical Exam Vitals and nursing note reviewed.  Constitutional:      General: He is not in acute distress.    Appearance: He is well-developed. He is not diaphoretic.  HENT:     Head: Normocephalic and atraumatic.  Cardiovascular:     Rate and Rhythm: Normal rate and regular rhythm.     Heart sounds: No murmur heard.    No friction rub.  Pulmonary:     Effort: Pulmonary effort is normal.  No respiratory distress.     Breath sounds: Normal breath sounds. No wheezing or rales.  Abdominal:     General: Bowel sounds are normal. There is no distension.     Palpations: Abdomen is soft.     Tenderness: There is no abdominal tenderness.     Comments: There is an incision noted to the right lower quadrant that appears to be healing well.  Musculoskeletal:        General: Normal range of motion.     Cervical back: Normal range of motion and neck supple.  Skin:    General: Skin is warm and dry.  Neurological:     Mental Status: He is alert and oriented to person, place, and time.     Coordination: Coordination normal.     ED Results / Procedures / Treatments   Labs (all labs ordered are listed, but only abnormal results are  displayed) Labs Reviewed  COMPREHENSIVE METABOLIC PANEL WITH GFR  CBC  POC OCCULT BLOOD, ED  TYPE AND SCREEN    EKG EKG Interpretation Date/Time:  Sunday Jun 28 2023 00:10:26 EDT Ventricular Rate:  65 PR Interval:    QRS Duration:  219 QT Interval:  562 QTC Calculation: 585 R Axis:   253  Text Interpretation: Atrial fibrillation RBBB and LAFB Confirmed by Orvilla Blander ([id number]) on 06/28/2023 12:23:24 AM  Radiology No results found.  Procedures Procedures    Medications Ordered in ED Medications  sodium chloride  0.9 % bolus 500 mL (has no administration in time range)    ED Course/ Medical Decision Making/ A&P  This patient is an 83 year old male with extensive past medical history as per HPI presenting with melanotic stool and bloody vomit that started yesterday.  Patient arrives here with stable vital signs and is afebrile.  His abdomen is basically benign.  Laboratory studies obtained including CBC, CMP, and lactate.  Potassium is 6.5 and remainder of electrolytes consistent with dialysis labs.  He does have a white count of 13,000 and hemoglobin of 6.9.  CT scan of the abdomen and pelvis obtained showing findings consistent with ischemic colitis.  These findings were discussed with Dr. Edgardo Goodwill from vascular surgery.  He did not feel as though there was anything emergently surgical, but agreed with me that the patient should be admitted to [facility].  While in the ER, I did treat the hyperkalemia with glucose and insulin .  He was also written for transfusion of 2 units of packed red blood cells.  Care has been discussed with Dr. Elyse Hand from the hospitalist service.  Given the patient's recent vascular surgery and lack of vascular surgery coverage here, patient will be admitted to [facility] for further care.  CRITICAL CARE Performed by: Orvilla Blander Total critical care time: 50 minutes Critical care time was exclusive of separately billable procedures and treating  other patients. Critical care was necessary to treat or prevent imminent or life-threatening deterioration. Critical care was time spent personally by me on the following activities: development of treatment plan with patient and/or surrogate as well as nursing, discussions with consultants, evaluation of patient's response to treatment, examination of patient, obtaining history from patient or surrogate, ordering and performing treatments and interventions, ordering and review of laboratory studies, ordering and review of radiographic studies, pulse oximetry and re-evaluation of patient's condition.   Final Clinical Impression(s) / ED Diagnoses Final diagnoses:  None    Rx / DC Orders ED Discharge Orders     None  Orvilla Blander, MD 06/28/23 [phone]

## 2023-06-29 DIAGNOSIS — N400 Enlarged prostate without lower urinary tract symptoms: Secondary | ICD-10-CM

## 2023-06-29 DIAGNOSIS — K559 Vascular disorder of intestine, unspecified: Secondary | ICD-10-CM

## 2023-06-29 DIAGNOSIS — E875 Hyperkalemia: Secondary | ICD-10-CM | POA: Diagnosis not present

## 2023-06-29 DIAGNOSIS — K922 Gastrointestinal hemorrhage, unspecified: Secondary | ICD-10-CM | POA: Diagnosis not present

## 2023-06-29 DIAGNOSIS — Z66 Do not resuscitate: Secondary | ICD-10-CM | POA: Insufficient documentation

## 2023-06-29 DIAGNOSIS — Z7189 Other specified counseling: Secondary | ICD-10-CM | POA: Diagnosis not present

## 2023-06-29 DIAGNOSIS — Z8679 Personal history of other diseases of the circulatory system: Secondary | ICD-10-CM

## 2023-06-29 DIAGNOSIS — I25810 Atherosclerosis of coronary artery bypass graft(s) without angina pectoris: Secondary | ICD-10-CM

## 2023-06-29 DIAGNOSIS — R4189 Other symptoms and signs involving cognitive functions and awareness: Secondary | ICD-10-CM | POA: Diagnosis not present

## 2023-06-29 DIAGNOSIS — N186 End stage renal disease: Secondary | ICD-10-CM | POA: Diagnosis not present

## 2023-06-29 DIAGNOSIS — I714 Abdominal aortic aneurysm, without rupture, unspecified: Secondary | ICD-10-CM

## 2023-06-29 DIAGNOSIS — I4891 Unspecified atrial fibrillation: Secondary | ICD-10-CM

## 2023-06-29 DIAGNOSIS — D649 Anemia, unspecified: Secondary | ICD-10-CM

## 2023-06-29 DIAGNOSIS — Z9889 Other specified postprocedural states: Secondary | ICD-10-CM | POA: Diagnosis not present

## 2023-06-29 LAB — COMPREHENSIVE METABOLIC PANEL WITH GFR
ALT: <5 U/L (ref 0–44)
AST: 12 U/L — ABNORMAL LOW (ref 15–41)
Albumin: 2.1 g/dL — ABNORMAL LOW (ref 3.5–5.0)
Alkaline Phosphatase: 66 U/L (ref 38–126)
Anion gap: 18 — ABNORMAL HIGH (ref 5–15)
BUN: 61 mg/dL — ABNORMAL HIGH (ref 8–23)
CO2: 24 mmol/L (ref 22–32)
Calcium: 8 mg/dL — ABNORMAL LOW (ref 8.9–10.3)
Chloride: 90 mmol/L — ABNORMAL LOW (ref 98–111)
Creatinine, Ser: 8.34 mg/dL — ABNORMAL HIGH (ref 0.61–1.24)
GFR, Estimated: 6 mL/min — ABNORMAL LOW (ref >60)
Glucose, Bld: 99 mg/dL (ref 70–99)
Potassium: 4.1 mmol/L (ref 3.5–5.1)
Sodium: 132 mmol/L — ABNORMAL LOW (ref 135–145)
Total Bilirubin: 0.9 mg/dL (ref 0.0–1.2)
Total Protein: 5 g/dL — ABNORMAL LOW (ref 6.5–8.1)

## 2023-06-29 LAB — CBC
HCT: 21 % — ABNORMAL LOW (ref 39.0–52.0)
Hemoglobin: 7.1 g/dL — ABNORMAL LOW (ref 13.0–17.0)
MCH: 29.8 pg (ref 26.0–34.0)
MCHC: 33.8 g/dL (ref 30.0–36.0)
MCV: 88.2 fL (ref 80.0–100.0)
Platelets: 210 10*3/uL (ref 150–400)
RBC: 2.38 MIL/uL — ABNORMAL LOW (ref 4.22–5.81)
RDW: 17.4 % — ABNORMAL HIGH (ref 11.5–15.5)
WBC: 19.4 10*3/uL — ABNORMAL HIGH (ref 4.0–10.5)
nRBC: 0 % (ref 0.0–0.2)

## 2023-06-29 LAB — TYPE AND SCREEN
ABO/RH(D): A POS
Antibody Screen: NEGATIVE
Unit division: 0
Unit division: 0

## 2023-06-29 LAB — RETICULOCYTES
Immature Retic Fract: 35.2 % — ABNORMAL HIGH (ref 2.3–15.9)
RBC.: 2.35 MIL/uL — ABNORMAL LOW (ref 4.22–5.81)
Retic Count, Absolute: 65.3 10*3/uL (ref 19.0–186.0)
Retic Ct Pct: 2.8 % (ref 0.4–3.1)

## 2023-06-29 LAB — BPAM RBC
Blood Product Expiration Date: 202505312359
Blood Product Expiration Date: 202506062359
ISSUE DATE / TIME: 202505180312
ISSUE DATE / TIME: 202505180632
Unit Type and Rh: 6200
Unit Type and Rh: 6200

## 2023-06-29 LAB — IRON AND TIBC
Iron: 32 ug/dL — ABNORMAL LOW (ref 45–182)
Saturation Ratios: 21 % (ref 17.9–39.5)
TIBC: 155 ug/dL — ABNORMAL LOW (ref 250–450)
UIBC: 123 ug/dL

## 2023-06-29 LAB — FERRITIN: Ferritin: 733 ng/mL — ABNORMAL HIGH (ref 24–336)

## 2023-06-29 LAB — PREPARE RBC (CROSSMATCH)

## 2023-06-29 LAB — MAGNESIUM: Magnesium: 1.8 mg/dL (ref 1.7–2.4)

## 2023-06-29 LAB — VITAMIN B12: Vitamin B-12: 218 pg/mL (ref 180–914)

## 2023-06-29 LAB — FOLATE: Folate: 10.9 ng/mL (ref >5.9)

## 2023-06-29 LAB — HEMOGLOBIN AND HEMATOCRIT, BLOOD
HCT: 20.3 % — ABNORMAL LOW (ref 39.0–52.0)
Hemoglobin: 7 g/dL — ABNORMAL LOW (ref 13.0–17.0)

## 2023-06-29 MED ORDER — QUETIAPINE 12.5 MG HALF TABLET: 12.5000 mg | ORAL_TABLET | Freq: Every day | ORAL | Status: DC

## 2023-06-29 MED ORDER — SODIUM CHLORIDE 0.9% IV SOLUTION: Freq: Once | INTRAVENOUS | Status: AC

## 2023-06-29 MED ORDER — HYDROXYZINE HCL 10 MG PO TABS
10.0000 mg | ORAL_TABLET | Freq: Three times a day (TID) | ORAL | Status: DC | PRN
  Administered 2023-06-29 – 2023-07-01 (×2): 10 mg via ORAL
  Filled 2023-06-29 (×2): qty 1

## 2023-06-29 MED ORDER — VANCOMYCIN HCL IN DEXTROSE 1-5 GM/200ML-% IV SOLN
INTRAVENOUS | Status: AC
  Filled 2023-06-29: qty 200

## 2023-06-29 NOTE — Progress Notes (Signed)
 [facility] ROUNDING NOTE   Subjective: Patient's wife and room at the time of my exam.  She provides most the history.  The patient is sleeping, but easily arousable, however he falls back asleep during the encounter.  Per nursing, the patient had 1 small hard dark stool with scant amount of blood this morning.  His hemoglobin was 7 this afternoon, stable from this morning, down 9/10 of a point from yesterday afternoon.  No further vomiting.  He denies abdominal pain at rest.   Objective: Vital signs in last 24 hours: Temp:  [97.4 F (36.3 C)-99 F (37.2 C)] 97.6 F (36.4 C) (05/19 0740) Pulse Rate:  [65-84] 69 (05/19 0740) Resp:  [12-24] 20 (05/19 0740) BP: (96-117)/(43-71) 103/44 (05/19 0740) SpO2:  [92 %-100 %] 100 % (05/19 0740) Last BM Date : 06/29/23 General: NAD, chronically ill-appearing Caucasian male, accompanied by wife at bedside Lungs:  CTA b/l, no w/r/r Heart:  RRR, no m/r/g Abdomen:  Soft, tenderness to palpation in the left lower quadrant and epigastrium without rigidity or guarding, ND, +BS Ext:  No c/c/e    Intake/Output from previous day: 05/18 0701 - 05/19 0700 In: 627.6 [Blood:326; IV Piggyback:301.6] Out: 1900  Intake/Output this shift: No intake/output data recorded.   Lab Results: Recent Labs    06/28/23 0102 06/28/23 1351 06/28/23 1857 06/29/23 0738 06/29/23 1321  WBC 13.0* 26.8*  --  19.4*  --   HGB 6.9* 8.0* 7.4* 7.1* 7.0*  PLT 293 243  --  210  --   MCV 88.4 86.8  --  88.2  --    BMET Recent Labs    06/28/23 0102 06/28/23 1351 06/29/23 0738  NA 123* 124* 132*  K 6.5* 5.8* 4.1  CL 85* 84* 90*  CO2 22 20* 24  GLUCOSE 137* 113* 99  BUN 86* 104* 61*  CREATININE 12.99* 13.71* 8.34*  CALCIUM  7.7* 7.8* 8.0*   LFT Recent Labs    06/28/23 0102 06/28/23 1351 06/29/23 0738  PROT 5.4*  --  5.0*  ALBUMIN  2.4* 2.2* 2.1*  AST 17  --  12*  ALT 5  --  <5  ALKPHOS 90  --  66  BILITOT 1.0  --  0.9   PT/INR No results  for input(s): "INR" in the last 72 hours.    Imaging/Other results: CT ABDOMEN PELVIS W CONTRAST Result Date: 06/28/2023 CLINICAL DATA:  Acute nonlocalized abdominal pain EXAM: CT ABDOMEN AND PELVIS WITH CONTRAST TECHNIQUE: Multidetector CT imaging of the abdomen and pelvis was performed using the standard protocol following bolus administration of intravenous contrast. RADIATION DOSE REDUCTION: This exam was performed according to the departmental dose-optimization program which includes automated exposure control, adjustment of the mA and/or kV according to patient size and/or use of iterative reconstruction technique. CONTRAST:  OMNIPAQUE  IOHEXOL  300 MG/ML  SOLN COMPARISON:  06/19/2023 FINDINGS: Lower chest: Improving consolidation within the visualized posterobasal right lower lobe. Small right pleural effusion is stable. Cardiac size within normal limits. Hepatobiliary: No focal liver abnormality is seen. No gallstones, gallbladder wall thickening, or biliary dilatation. Pancreas: Unremarkable Spleen: Unremarkable Adrenals/Urinary Tract: The adrenal glands are unremarkable. The kidneys are markedly atrophic bilaterally. Multiple simple cortical cysts are seen within the kidneys bilaterally for which no follow-up imaging is recommended. 2.4 cm hyperdense lesion is seen within the lower pole the left kidney which is indeterminate but demonstrates approximately 5 mm interval increase in size since more remote prior examination of 03/25/2022. The kidneys are otherwise unremarkable. Bladder  unremarkable. Stomach/Bowel: There is diffuse mural thickening and mild pericolonic inflammatory stranding involving descending and proximal sigmoid colon suspicious for changes of ischemic colitis though a long segment infectious or inflammatory colitis could appear similarly. The stomach, small bowel, and large bowel are otherwise unremarkable. Appendix absent. No free intraperitoneal gas or fluid. No evidence of  obstruction. Vascular/Lymphatic: Endograft repair of an infrarenal abdominal aortic aneurysm is again noted with extension of the proximal landing zone cranially with partial coverage of the origin of the superior mesenteric artery best seen on sagittal image # 84/4 the bifurcated stent graft demonstrates distal landing zones within the left distal common iliac artery just above the bifurcation and appears to extend into the right external iliac artery, unchanged from prior examination. The aneurysm sac is unchanged measuring 11.1 x 11.3 cm at axial image # 48/2 in the region of the contained leak along the left posterolateral margin aneurysm sac. There is extensive atherosclerotic calcification within the proximal and mid superior mesenteric artery without evidence of hemodynamically significant stenosis on this non arteriographic study. Patency of the inferior mesenteric artery is not well assessed on this non arteriographic study. Stable hematoma within the right inguinal region superficial to the right common femoral artery. Hematoma within the right retroperitoneum appears decreased in size since prior examination. No active extravasation identified. No pathologic adenopathy within the abdomen and pelvis. Reproductive: Fiducial markers noted within prostate gland. Mild prostatic hypertrophy. Other: Mild diffuse subcutaneous body wall edema. Small left inguinal hernia again noted with the proximal sigmoid colon noted in the region of the internal ring. Musculoskeletal: No acute bone abnormality. Degenerative changes are seen within the lumbar spine. IMPRESSION: 1. Diffuse mural thickening and mild pericolonic inflammatory stranding involving the descending and proximal sigmoid colon suspicious for changes of ischemic colitis though a long segment infectious or inflammatory colitis could appear similarly. Superior mesenteric artery is patent though diseased with partial coverage of the origin by the proximally  extended endovascular stent graft. Inferior mesenteric artery patency is not well assessed on this non arteriographic examination. No evidence of obstruction or perforation. 2. 11.3 cm infrarenal abdominal aortic aneurysm status post endograft repair. Stable size of the contained leak along the left posterolateral margin of the aneurysm sac. No active extravasation identified. 3. Stable right inguinal hematoma superficial to the right common femoral artery. Decreasing right retroperitoneal hematoma. No active extravasation identified. 4. 2.4 cm indeterminate hyperdense lesion within the lower pole the left kidney demonstrating approximately 5 mm interval increase in size since more remote prior examination of 03/25/2022. While this may represent a hemorrhagic or proteinaceous cyst, follow-up multiphasic CT or MRI examination is recommended for further evaluation. 5. Improving consolidation within the visualized posterobasal right lower lobe. Stable small right pleural effusion. 6. Small left inguinal hernia with the proximal sigmoid colon noted in the region of the internal ring. No evidence of obstruction. Aortic aneurysm NOS (ICD10-I71.9). Electronically Signed   By: Worthy Heads M.D.   On: 06/28/2023 02:17      Assessment and Plan:  83 year old gentleman with an extensive past medical history noteworthy for ESRD on HD, HTN, CAD/CABG, A-fib and AAA status post repair with stent and angioplasty 06/17/2023 (on ASA and Plavix ) admitted to the hospital with onset of melena, coffee-ground emesis and anemia on 06/27/2023 in addition to hyperkalemia and significantly elevated creatinine after missing hemodialysis.  Clinical history very consistent with an upper GI bleed, however CT scan also showed evidence suggestive of ischemic colitis.  He has not had  any further hematemesis, and passed only a small formed dark stool this morning, suggestive of old blood.  I discussed with the patient and the patient's wife  that he has experienced an upper GI bleed, and an upper endoscopy should strongly be considered to identify the bleeding source and possible provide interventions to reduce risk of ongoing bleeding.  The patient does not wish to undergo any invasive procedures.  The patient's wife feels that he is lucid enough to make that decision at the time of my evaluation.  We discussed how the need for endoscopy was not emergent at the moment, as it appears that his bleeding has substantially slowed or possibly stopped with medical therapy.  The patient's wife was in agreement with deferring on an invasive procedure for now and continuing with supportive care.  Will follow-up with the patient tomorrow and reassess risk/benefits of endoscopic evaluation.  Upper GI bleed, seems unlikely to be related to endograft Appears stabilized with acid suppression and cessation of antiplatelet therapy - Patient expressed preference to avoid an endoscopy.  There has been some question as to his ability to make medical decisions given his waxing and waning mental status.  His wife is at bedside, and believes we should adhere to his wishes at the moment.  As it is not a medical emergency given clinical improvement in bleeding, I agree with holding off on endoscopic evaluation for now.  Will reassess tomorrow -Continue Protonix  40 mg IV twice daily -Clear liquid diet okay from GI standpoint -Continue to hold aspirin , Plavix  for now  Questionable ischemic colitis Based on CT findings, no clear clinical history, but is tender in left lower quadrant - May have been precipitated by volume loss from upper GI bleed - No plans for colonoscopy - Can continue supportive care  ESRD/hyperkalemia - Improved with HD  Leukocytosis - Unclear source, possibly ischemic colitis, treated empirically with broad spectum abx   AAA s/p EVAR 5/17 - Vascular following, ok to hold ASA/Plavix    Elois Hair, MD  06/29/2023, 2:55  PM [facility]

## 2023-06-29 NOTE — Progress Notes (Signed)
 PROGRESS NOTE  Ronald [name] ZOX:[id number] DOB: 01/10/1941   PCP: Omie Bickers, MD  Patient is from: Home.  Lives with his wife.  Lately ambulating by holding to furnitures and walls  DOA: 06/27/2023 LOS: 1  Chief complaints Chief Complaint  Patient presents with   Rectal Bleeding     Brief Narrative / Interim history: 83 year old M with PMH of CAD/CABG, HFmrEF, ESRD on HD, CVA, HTN, neurocognitive disorder, A-fib not on AC, AAA with retroperitoneal hematoma s/p EVAR on 5/17 by Dr. Fulton Job presented to [facility] ED with rectal bleed.    In ED, stable vitals.  Hgb 6.9 (8.7 on 5/13).  K6.5.  WBC 13k. Na 123.  Cr 13.  BUN 86.  CT angio raise concern for ischemic colitis.  Lactic acid 1.1>> 1.8.  Two units of blood ordered.  Nephrology, GI and vascular surgery consulted, and patient was transferred to [facility] for further evaluation.   On arrival here, vitals remained stable.  Confused and delirious.  K improved to 5.8.  BUN 104.  Bicarb 20.  AG 20.  Na 124.  WBC trended up to 26.8.  Hgb improved to 8.0 to 2 units.  Evaluated by GI, vascular and nephrology.    Subjective: Seen and examined earlier this morning.  No major events overnight of this morning.  Had dialysis with ultrafiltration of 2 L earlier this morning.  Has no complaints but not a reliable historian.  He is awake and alert but only oriented to self.  Follows commands.  Responds no to pain.   Objective: Vitals:   06/29/23 0342 06/29/23 0423 06/29/23 0528 06/29/23 0740  BP: 111/60 (!) 107/50 106/66 (!) 103/44  Pulse: 81 77 75 69  Resp: 14  16 20   Temp: 98.2 F (36.8 C) 98.9 F (37.2 C) 98.5 F (36.9 C) 97.6 F (36.4 C)  TempSrc: Oral Oral  Oral  SpO2: 95% 99% 98% 100%    Examination:  GENERAL: No apparent distress.  Nontoxic. HEENT: MMM.  Vision and hearing grossly intact.  NECK: Supple.  No apparent JVD.  RESP:  No IWOB.  Fair aeration bilaterally. CVS:  RRR. Heart sounds normal.  AV fistula left  forearm ABD/GI/GU: BS+. Abd soft, NTND.  MSK/EXT:   No apparent deformity.  BLE edema, right> left.  Difficult to palpate DP and PT pulses. SKIN: Right groin wound DCI.  No ecchymosis or signs of hematoma.  Slightly firm NEURO: AA.  Oriented only to self.  Follows some commands.  No apparent focal neuro deficit. PSYCH: Calm. Normal affect.   Consultants:  Vascular surgery Gastroenterology Nephrology  Procedures: Hemodialysis  Microbiology summarized: Blood cultures NGTD  Assessment and plan: ABLA and GI bleed: Patient's wife reports dark blood clots from his nose, mouth and also blood in the stool.  Patient was on Plavix  and aspirin  after recent vascular procedure.  CT angio did not show extravasation.  Transfused 2 units on 5/18.  Hgb dropped again to 7.1.  No report of overt bleeding overnight of this morning. Recent Labs    06/18/23 0331 06/18/23 1832 06/19/23 1007 06/20/23 0941 06/22/23 0404 06/23/23 0901 06/28/23 0102 06/28/23 1351 06/28/23 1857 06/29/23 0738  HGB 9.5* 7.4* 9.4* 8.7* 8.7* 8.7* 6.9* 8.0* 7.4* 7.1*  -Continue holding Plavix  and aspirin  -Transfuse additional 1 unit.  Goal Hgb > 8.0 given CAD -Continue monitoring H&H -Check anemia panel - GI recommended IV PPI and a scope at some point  Leukocytosis: WBC peaked at 26 and improved  to 20 today.  Not toxic and hemodynamically stable.  Lactic acid 1.1> 1.8.  At risk for intra-abdominal infection given GI bleed.  Blood cultures NGTD.  CT abdomen and pelvis as above. -Discontinue IV vancomycin  -Continue IV Zosyn  -Continue monitoring leukocytosis   ESRD/azotemia/hyponatremia/hyperkalemia/hyponatremia: Hyperkalemia resolved.  Hyponatremia improved. -Dialysis by nephrology.  Had 2 L ultrafiltration this morning  Chronic HFmrEF: TTE in 11/2022 with LVEF of 40%, G2-DD and moderate AAS.  Appears euvolemic except for right lower extremity edema.  No respiratory distress. -Fluid management by dialysis -Continue  holding Toprol -XL given hypotension  Paroxysmal atrial fibrillation: Rate controlled.  Not on anticoagulation due to risk of bleeding -Continue home amiodarone  -Continue holding metoprolol  given soft blood pressure   History of CAD/CABG: No cardiopulmonary symptoms. -Keep Hgb > 8.0. -Hold aspirin  and Plavix  for now given GI bleed -Continue statin  AAA with retroperitoneal hematoma s/p EVAR on 5/17 by Dr. Fulton Job.  CT raises concern for ischemic colitis.  Lactic acid 1.1 and 1.8.  LLQ tenderness on exam.  No rebound or guarding. -No intervention from vascular standpoint.  -Antibiotics as above pending cultures. -Continue holding Plavix  and aspirin .   Severe dementia with delirium:  Awake and alert.  Only oriented to self.  Follows commands.  Seems to be his baseline.  No focal neurodeficit. - Reorientation and delirium precaution -Minimize sedating medications.   -Decrease Dilaudid  and Ambien . -Abdominal binder -Fall precaution   Lower extremity edema, right> left. - Check lower extremity venous Doppler   Dyslipidemia - Continue statin   History of prostate cancer/BPH s/p  seed implantation, follows with Dr. Claretta Croft -Continue Flomax   Advance care planning: DNR-Limited.  See IPAL note for details.   There is no height or weight on file to calculate BMI.           DVT prophylaxis:  SCDs Start: 06/28/23 0746  Code Status: DNR-Limited Family Communication: Updated patient's wife at bedside Level of care: Telemetry Medical Status is: Inpatient Remains inpatient appropriate because: Acute blood loss anemia, GI bleed, leukocytosis   Final disposition: To be determined   55 minutes with more than 50% spent in reviewing records, counseling patient/family and coordinating care.   Sch Meds:  Scheduled Meds:  sodium chloride    Intravenous Once   sodium chloride    Intravenous Once   amiodarone   200 mg Oral Daily   atorvastatin   80 mg Oral Daily   calcium  acetate   1,334 mg Oral TID WC   Chlorhexidine  Gluconate Cloth  6 each Topical Q0600   mirtazapine   7.5 mg Oral QHS   pantoprazole  (PROTONIX ) IV  40 mg Intravenous Q12H   tamsulosin   0.4 mg Oral QPC supper   Continuous Infusions:  piperacillin -tazobactam (ZOSYN )  IV 2.25 g (06/29/23 0547)   PRN Meds:.acetaminophen  **OR** acetaminophen , HYDROmorphone  (DILAUDID ) injection, ondansetron  **OR** ondansetron  (ZOFRAN ) IV, zolpidem   Antimicrobials: Anti-infectives (From admission, onward)    Start     Dose/Rate Route Frequency Ordered Stop   06/28/23 2100  vancomycin  (VANCOCIN ) IVPB 1000 mg/200 mL premix        1,000 mg 200 mL/hr over 60 Minutes Intravenous Once in dialysis 06/28/23 2002 06/29/23 0509   06/28/23 1645  vancomycin  (VANCOREADY) IVPB 2000 mg/400 mL        2,000 mg 200 mL/hr over 120 Minutes Intravenous  Once 06/28/23 1555 06/28/23 1826   06/28/23 1554  vancomycin  variable dose per unstable renal function (pharmacist dosing)  Status:  Discontinued         Does not  apply See admin instructions 06/28/23 1555 06/29/23 0749   06/28/23 1515  piperacillin -tazobactam (ZOSYN ) IVPB 2.25 g        2.25 g 100 mL/hr over 30 Minutes Intravenous Every 8 hours 06/28/23 1415          I have personally reviewed the following labs and images: CBC: Recent Labs  Lab 06/23/23 0901 06/28/23 0102 06/28/23 1351 06/28/23 1857 06/29/23 0738  WBC 7.1 13.0* 26.8*  --  19.4*  HGB 8.7* 6.9* 8.0* 7.4* 7.1*  HCT 26.5* 20.5* 23.1* 21.4* 21.0*  MCV 87.2 88.4 86.8  --  88.2  PLT 174 293 243  --  210   BMP &GFR Recent Labs  Lab 06/23/23 0922 06/28/23 0102 06/28/23 1351 06/29/23 0738  NA 133* 123* 124* 132*  K 4.0 6.5* 5.8* 4.1  CL 93* 85* 84* 90*  CO2 22 22 20* 24  GLUCOSE 93 137* 113* 99  BUN 62* 86* 104* 61*  CREATININE 10.66* 12.99* 13.71* 8.34*  CALCIUM  8.2* 7.7* 7.8* 8.0*  MG  --   --   --  1.8  PHOS 6.9*  --  6.7*  --    Estimated Creatinine Clearance: 8.1 mL/min (A) (by C-G formula based  on SCr of 8.34 mg/dL (H)). Liver & Pancreas: Recent Labs  Lab 06/23/23 0922 06/28/23 0102 06/28/23 1351 06/29/23 0738  AST  --  17  --  12*  ALT  --  5  --  <5  ALKPHOS  --  90  --  66  BILITOT  --  1.0  --  0.9  PROT  --  5.4*  --  5.0*  ALBUMIN  2.3* 2.4* 2.2* 2.1*   No results for input(s): "LIPASE", "AMYLASE" in the last 168 hours. No results for input(s): "AMMONIA" in the last 168 hours. Diabetic: No results for input(s): "HGBA1C" in the last 72 hours. No results for input(s): "GLUCAP" in the last 168 hours. Cardiac Enzymes: No results for input(s): "CKTOTAL", "CKMB", "CKMBINDEX", "TROPONINI" in the last 168 hours. No results for input(s): "PROBNP" in the last 8760 hours. Coagulation Profile: No results for input(s): "INR", "PROTIME" in the last 168 hours. Thyroid  Function Tests: No results for input(s): "TSH", "T4TOTAL", "FREET4", "T3FREE", "THYROIDAB" in the last 72 hours. Lipid Profile: No results for input(s): "CHOL", "HDL", "LDLCALC", "TRIG", "CHOLHDL", "LDLDIRECT" in the last 72 hours. Anemia Panel: Recent Labs    06/29/23 0945  RETICCTPCT 2.8   Urine analysis:    Component Value Date/Time   COLORURINE YELLOW 06/10/2023 1007   APPEARANCEUR CLEAR 06/10/2023 1007   APPEARANCEUR Clear 10/29/2022 0906   LABSPEC 1.006 06/10/2023 1007   PHURINE 7.0 06/10/2023 1007   GLUCOSEU NEGATIVE 06/10/2023 1007   HGBUR NEGATIVE 06/10/2023 1007   BILIRUBINUR NEGATIVE 06/10/2023 1007   BILIRUBINUR Negative 10/29/2022 0906   KETONESUR NEGATIVE 06/10/2023 1007   PROTEINUR 100 (A) 06/10/2023 1007   UROBILINOGEN 0.2 05/17/2019 0917   NITRITE NEGATIVE 06/10/2023 1007   LEUKOCYTESUR NEGATIVE 06/10/2023 1007   Sepsis Labs: Invalid input(s): "PROCALCITONIN", "LACTICIDVEN"  Microbiology: Recent Results (from the past 240 hours)  Culture, blood (Routine X 2) w Reflex to ID Panel     Status: None (Preliminary result)   Collection Time: 06/28/23  3:21 PM   Specimen: BLOOD RIGHT  ARM  Result Value Ref Range Status   Specimen Description BLOOD RIGHT ARM  Final   Special Requests   Final    BOTTLES DRAWN AEROBIC AND ANAEROBIC Blood Culture results may not be optimal due  to an inadequate volume of blood received in culture bottles   Culture   Final    NO GROWTH < 24 HOURS Performed at [facility], [address] [place], [place] [address]    Report Status PENDING  Incomplete  Culture, blood (Routine X 2) w Reflex to ID Panel     Status: None (Preliminary result)   Collection Time: 06/28/23  3:23 PM   Specimen: BLOOD RIGHT HAND  Result Value Ref Range Status   Specimen Description BLOOD RIGHT HAND  Final   Special Requests   Final    BOTTLES DRAWN AEROBIC AND ANAEROBIC Blood Culture results may not be optimal due to an inadequate volume of blood received in culture bottles   Culture   Final    NO GROWTH < 24 HOURS Performed at [facility], [address] [place], [place] [address]    Report Status PENDING  Incomplete    Radiology Studies: No results found.    Herberta Pickron T. Auriah Hollings [facility]  If 7PM-7AM, please contact night-coverage [email or web address] 06/29/2023, 12:46 PM

## 2023-06-29 NOTE — Plan of Care (Signed)
  Problem: Education: Goal: Knowledge of General Education information will improve Description: Including pain rating scale, medication(s)/side effects and non-pharmacologic comfort measures Outcome: Progressing   Problem: Activity: Goal: Risk for activity intolerance will decrease Outcome: Progressing   Problem: Safety: Goal: Ability to remain free from injury will improve Outcome: Progressing   Problem: Safety: Goal: Non-violent Restraint(s) Outcome: Progressing

## 2023-06-29 NOTE — Progress Notes (Addendum)
  Progress Note    06/29/2023 8:10 AM * No surgery found *  Subjective:  says he feels okay    Vitals:   06/29/23 0528 06/29/23 0740  BP: 106/66 (!) 103/44  Pulse: 75 69  Resp: 16 20  Temp: 98.5 F (36.9 C) 97.6 F (36.4 C)  SpO2: 98% 100%    Physical Exam: General:  resting comfortably Lungs:  nonlabored Extremities:  BLE warm and well perfused Abdomen:  mild lower abdominal tenderness  CBC    Component Value Date/Time   WBC 26.8 (H) 06/28/2023 1351   RBC 2.66 (L) 06/28/2023 1351   HGB 7.4 (L) 06/28/2023 1857   HCT 21.4 (L) 06/28/2023 1857   PLT 243 06/28/2023 1351   MCV 86.8 06/28/2023 1351   MCH 30.1 06/28/2023 1351   MCHC 34.6 06/28/2023 1351   RDW 17.0 (H) 06/28/2023 1351   LYMPHSABS 1.3 09/12/2015 1015   MONOABS 0.6 09/12/2015 1015   EOSABS 0.2 09/12/2015 1015   BASOSABS 0.0 09/12/2015 1015    BMET    Component Value Date/Time   NA 124 (L) 06/28/2023 1351   K 5.8 (H) 06/28/2023 1351   CL 84 (L) 06/28/2023 1351   CO2 20 (L) 06/28/2023 1351   GLUCOSE 113 (H) 06/28/2023 1351   BUN 104 (H) 06/28/2023 1351   CREATININE 13.71 (H) 06/28/2023 1351   CALCIUM  7.8 (L) 06/28/2023 1351   GFRNONAA 3 (L) 06/28/2023 1351   GFRAA 15 (L) 08/10/2019 1109    INR    Component Value Date/Time   INR 1.4 (H) 06/17/2023 1839     Intake/Output Summary (Last 24 hours) at 06/29/2023 0810 Last data filed at 06/29/2023 0509 Gross per 24 hour  Intake 627.62 ml  Output 1900 ml  Net -1272.38 ml      Assessment/Plan:  83 y.o. male admitted with melena   -The patient was admitted with melena and CT scan concerning for possible ischemic colitis -He had a recent physician modified AAA stent graft repair. CT scan demonstrated an intact repair and intact SMA -No vascular intervention required at this time, we will follow   Deneise Finlay, PA-C [facility] [phone] 06/29/2023 8:10 AM   I have seen and evaluated the patient. I agree with the  PA note as documented above.  Recently underwent physician modified endograft for a type Ia endoleak with large 13 cm aneurysm.  Admitted for GI bleed.  Having minimal abdominal pain on my exam.  Right groin looks really good after cutdown in the OR.  Palpable PT pulses bilaterally.  SMA fills where we created a scallop for modified endograft.  CT looks good from vascular standpoint. Appreciate input from GI and possible EGD.  Young Hensen, MD [facility] [place] Office: [phone]

## 2023-06-29 NOTE — Progress Notes (Signed)
 Received patient in bed to unit.  Alert and oriented.  Informed consent signed and in chart.   TX duration: 3.20 hourd  Patient tolerated well.  Transported back to the room  Alert, without acute distress.  Hand-off given to patient's nurse.   Access used: fistula Access issues: none  Total UF removed: 1900 ml Medication(s) given: vanc 1g/240ml Post HD VS: 107/50 Post HD weight: unable to obtain   06/29/23 0342  Vitals  Temp 98.2 F (36.8 C)  Temp Source Oral  BP 111/60  MAP (mmHg) 73  BP Location Right Arm  BP Method Automatic  Patient Position (if appropriate) Lying  Pulse Rate 81  Pulse Rate Source Monitor  ECG Heart Rate 79  Resp 14  Oxygen Therapy  SpO2 95 %  O2 Device Room Air  Patient Activity (if Appropriate) In bed  Pulse Oximetry Type Continuous  During Treatment Monitoring  Blood Flow Rate (mL/min) 0 mL/min  Arterial Pressure (mmHg) -1.01 mmHg  Venous Pressure (mmHg) -2.42 mmHg  TMP (mmHg) -51.91 mmHg  Ultrafiltration Rate (mL/min) 1617 mL/min  Dialysate Flow Rate (mL/min) 0 ml/min  Duration of HD Treatment -hour(s) 3.33 hour(s)  Cumulative Fluid Removed (mL) per Treatment  1900.23  Post Treatment  Dialyzer Clearance Lightly streaked  Hemodialysis Intake (mL) 0 mL  Liters Processed 80  Fluid Removed (mL) 1900 mL  Tolerated HD Treatment Yes  Post-Hemodialysis Comments HD tx achieved as expected tolerated well. pt is stable.  AVG/AVF Arterial Site Held (minutes) 0 minutes  AVG/AVF Venous Site Held (minutes) 0 minutes  Note  Patient Observations pt is in bed resting, eyes open  Fistula / Graft Left Forearm Arteriovenous fistula  No placement date or time found.   Placed prior to admission: Yes  Orientation: Left  Access Location: Forearm  Access Type: Arteriovenous fistula  Site Condition No complications  Fistula / Graft Assessment Present;Thrill;Bruit  Status Deaccessed  Drainage Description None      [facility]

## 2023-06-29 NOTE — IPAL (Signed)
  Interdisciplinary Goals of Care Family Meeting   Date carried out: 06/29/2023  Location of the meeting: Bedside  Member's involved: Physician and Family Member or next of kin (spouse)  Durable Power of Insurance risk surveyor: Spouse  Discussion: We discussed goals of care for [employer] .  After I updated patient's wife on his current condition and treatment plan, we discussed patient's CODE STATUS with patient's wife at bedside.  She stated that he previously did not want CPR and intubation.  However, he said "okay" to CPR during recent hospitalization.  I do not think patient has capacity to understand such complex discussion to make his own decision given his severe dementia.  He is only oriented to self and has no insight into where he is at or why he is in the hospital.  By default, the burden of making this decision falls on his wife.  After we discussed pros and cons of CPR in light of his comorbidity, patient's wife wants CODE STATUS changed to DNR.  CODE STATUS updated in computer.  This can be reversed for procedures such as EGD.  Code status:   Code Status: Limited: Do not attempt resuscitation (DNR) -DNR-LIMITED -Do Not Intubate/DNI    Disposition: Continue current acute care  Time spent for the meeting: 30 minutes    Theadore Finger, MD  06/29/2023, 1:03 PM

## 2023-06-29 NOTE — Plan of Care (Signed)
  Problem: Clinical Measurements: Goal: Diagnostic test results will improve Outcome: Progressing   Problem: Nutrition: Goal: Adequate nutrition will be maintained Outcome: Progressing   Problem: Coping: Goal: Level of anxiety will decrease Outcome: Progressing   Problem: Elimination: Goal: Will not experience complications related to bowel motility Outcome: Progressing

## 2023-06-29 NOTE — Progress Notes (Signed)
 [facility] Progress Note  Name: Ronald [name] MRN: [id number] DOB: [birth year]  Chief Complaint:  Bloody stools and weakness  Subjective:  Last HD on 5/18 with 1.9 kg UF.  Sleepy today and provides limited history.  His wife is at bedside.  She doesn't think he'll agree to a third HD treatment this week.  She's been worried about his breathing.  Does twice a week HD.  States that he had bloody stools and threw up blood.    Review of systems:  Denies overt shortness of breath From spouse as above - history is limited from patient due to mild cognitive impairment/AMS  ---------- Background on consult:  Reason for Consult: ESRD pt w/ bloody stools, gen'd weakness, missed HD HPI: The patient is a 83 y.o. year-old w/ PMH as below who presented to [facility] ED early this morning complaining of less than 1 day of bloody stool, generalized weakness and missed dialysis.  In ED hemoglobin 6.9, K+ 6.5, WBC 13K, sodium 123, creatinine 12.9.  He was started on transfusion of PRBCs and given some D50 and insulin  for high potassium.  CT of the abdomen showed some findings of mild ischemic colitis possibly.  Case was discussed with GI at [facility] and patient was transferred to [facility].  We are asked to see for dialysis    Intake/Output Summary (Last 24 hours) at 06/29/2023 1252 Last data filed at 06/29/2023 0509 Gross per 24 hour  Intake 301.62 ml  Output 1900 ml  Net -1598.38 ml    Vitals:  Vitals:   06/29/23 0342 06/29/23 0423 06/29/23 0528 06/29/23 0740  BP: 111/60 (!) 107/50 106/66 (!) 103/44  Pulse: 81 77 75 69  Resp: 14  16 20   Temp: 98.2 F (36.8 C) 98.9 F (37.2 C) 98.5 F (36.9 C) 97.6 F (36.4 C)  TempSrc: Oral Oral  Oral  SpO2: 95% 99% 98% 100%     Physical Exam:  General elderly male in bed in no acute distress HEENT normocephalic atraumatic extraocular movements intact sclera anicteric Neck supple trachea midline Lungs clear to auscultation bilaterally  normal work of breathing at rest on room air Heart S1S2 no rub Abdomen soft nontender nondistended Extremities 1-2+ edema lower extremities Psych normal mood and affect Neuro - sleepy. Oriented to person, recognizes spouse. Not oriented to year. States we are in a hospital    Medications reviewed   Labs:     Latest Ref Rng & Units 06/29/2023    7:38 AM 06/28/2023    1:51 PM 06/28/2023    1:02 AM  BMP  Glucose 70 - 99 mg/dL 99  409  811   BUN 8 - 23 mg/dL 61  914  86   Creatinine 0.61 - 1.24 mg/dL 7.82  95.62  13.08   Sodium 135 - 145 mmol/L 132  124  123   Potassium 3.5 - 5.1 mmol/L 4.1  5.8  6.5   Chloride 98 - 111 mmol/L 90  84  85   CO2 22 - 32 mmol/L 24  20  22    Calcium  8.9 - 10.3 mg/dL 8.0  7.8  7.7    Outpatient HD orders:  [facility]   3 hours 15 minutes  EDW 91.5 kg  BF 300 ml/min DF 600 ml/min Monday and Friday  3K/2.5 ca Last post weight: 91.3 kg on 5/5 Meds: Mircera every 4 weeks but doesn't look like has received in the past several weeks due to being hospitalized per [facility] RN;  Venofer 50 mg weekly No activated vit D No heparin      Assessment/ Plan: Acute GI bleeding/ rectal bleeding: getting 2u prbc's. H/o recent RP bleed/ hematoma. Off a/c, but is on DAPT due to hx of CABG and recent AAA repair. GI consulting.  ESRD: on HD 2x per week, Mon/Friday. Missed 2-3 HD sessions prior to admission and got HD on 5/18 PM.   Recommend HD MWF while here given overload  Called HD unit for his outpatient Rx as above  HTN: acceptable on current regimen  Hyperkalemia - improved with HD Anemia of esrd: PRBC's per primary team   Secondary hyperparathyroidism: hyperphos. Cont phoslo  ac. Recent AAA repair  Mild cognitive impairment - noted per charting  Atrial fib: is on BB, not an a/c candidate per pmd Chronic syst HF: last echo EF 40%, G2DD   Disposition - per primary team   Nan Aver, MD 06/29/2023 1:17 PM

## 2023-06-30 DIAGNOSIS — N186 End stage renal disease: Secondary | ICD-10-CM | POA: Diagnosis not present

## 2023-06-30 DIAGNOSIS — I714 Abdominal aortic aneurysm, without rupture, unspecified: Secondary | ICD-10-CM | POA: Diagnosis not present

## 2023-06-30 DIAGNOSIS — Z9889 Other specified postprocedural states: Secondary | ICD-10-CM | POA: Diagnosis not present

## 2023-06-30 DIAGNOSIS — R4189 Other symptoms and signs involving cognitive functions and awareness: Secondary | ICD-10-CM | POA: Diagnosis not present

## 2023-06-30 DIAGNOSIS — I25810 Atherosclerosis of coronary artery bypass graft(s) without angina pectoris: Secondary | ICD-10-CM | POA: Diagnosis not present

## 2023-06-30 DIAGNOSIS — D649 Anemia, unspecified: Secondary | ICD-10-CM | POA: Diagnosis not present

## 2023-06-30 DIAGNOSIS — K922 Gastrointestinal hemorrhage, unspecified: Secondary | ICD-10-CM | POA: Diagnosis not present

## 2023-06-30 LAB — RENAL FUNCTION PANEL
Albumin: 2.1 g/dL — ABNORMAL LOW (ref 3.5–5.0)
Anion gap: 19 — ABNORMAL HIGH (ref 5–15)
BUN: 71 mg/dL — ABNORMAL HIGH (ref 8–23)
CO2: 24 mmol/L (ref 22–32)
Calcium: 8.2 mg/dL — ABNORMAL LOW (ref 8.9–10.3)
Chloride: 90 mmol/L — ABNORMAL LOW (ref 98–111)
Creatinine, Ser: 9.62 mg/dL — ABNORMAL HIGH (ref 0.61–1.24)
GFR, Estimated: 5 mL/min — ABNORMAL LOW (ref >60)
Glucose, Bld: 91 mg/dL (ref 70–99)
Phosphorus: 6.7 mg/dL — ABNORMAL HIGH (ref 2.5–4.6)
Potassium: 4.4 mmol/L (ref 3.5–5.1)
Sodium: 133 mmol/L — ABNORMAL LOW (ref 135–145)

## 2023-06-30 LAB — CBC
HCT: 22.4 % — ABNORMAL LOW (ref 39.0–52.0)
Hemoglobin: 7.6 g/dL — ABNORMAL LOW (ref 13.0–17.0)
MCH: 29.6 pg (ref 26.0–34.0)
MCHC: 33.9 g/dL (ref 30.0–36.0)
MCV: 87.2 fL (ref 80.0–100.0)
Platelets: 200 10*3/uL (ref 150–400)
RBC: 2.57 MIL/uL — ABNORMAL LOW (ref 4.22–5.81)
RDW: 17.2 % — ABNORMAL HIGH (ref 11.5–15.5)
WBC: 12.7 10*3/uL — ABNORMAL HIGH (ref 4.0–10.5)
nRBC: 0 % (ref 0.0–0.2)

## 2023-06-30 LAB — HEMOGLOBIN AND HEMATOCRIT, BLOOD
HCT: 23.3 % — ABNORMAL LOW (ref 39.0–52.0)
Hemoglobin: 7.7 g/dL — ABNORMAL LOW (ref 13.0–17.0)

## 2023-06-30 LAB — HEPATITIS B SURFACE ANTIBODY, QUANTITATIVE: Hep B S AB Quant (Post): 34.3 m[IU]/mL

## 2023-06-30 MED ORDER — DARBEPOETIN ALFA 150 MCG/0.3ML IJ SOSY
150.0000 ug | PREFILLED_SYRINGE | Freq: Once | INTRAMUSCULAR | Status: AC
  Administered 2023-06-30: 150 ug via SUBCUTANEOUS
  Filled 2023-06-30: qty 0.3

## 2023-06-30 MED ORDER — ACETAMINOPHEN 325 MG PO TABS
650.0000 mg | ORAL_TABLET | Freq: Four times a day (QID) | ORAL | Status: DC
  Administered 2023-06-30 – 2023-07-02 (×8): 650 mg via ORAL
  Filled 2023-06-30 (×7): qty 2

## 2023-06-30 MED ORDER — CHLORHEXIDINE GLUCONATE CLOTH 2 % EX PADS
6.0000 | MEDICATED_PAD | Freq: Every day | CUTANEOUS | Status: DC
  Administered 2023-07-01: 6 via TOPICAL

## 2023-06-30 NOTE — Progress Notes (Signed)
 [facility] Progress Note  Name: Ronald [name] MRN: [id number] DOB: 1940/12/10  Chief Complaint:  Bloody stools and weakness  Subjective:  Last HD on 5/18 with 1.9 kg UF.  Gets HD twice a week outpatient.  He does agree to HD again tomorrow; his wife agrees as well.  His wife feels like his breathing is better today and he has been more alert - spoke with her at bedside.   Review of systems:    Denies overt shortness of breath or chest pain Denies n/v Limited due to mild cognitive impairment   ---------- Background on consult:  Reason for Consult: ESRD pt w/ bloody stools, gen'd weakness, missed HD HPI: The patient is a 83 y.o. year-old w/ PMH as below who presented to [facility] ED early this morning complaining of less than 1 day of bloody stool, generalized weakness and missed dialysis.  In ED hemoglobin 6.9, K+ 6.5, WBC 13K, sodium 123, creatinine 12.9.  He was started on transfusion of PRBCs and given some D50 and insulin  for high potassium.  CT of the abdomen showed some findings of mild ischemic colitis possibly.  Case was discussed with GI at [facility] and patient was transferred to [facility].  We are asked to see for dialysis    Intake/Output Summary (Last 24 hours) at 06/30/2023 1107 Last data filed at 06/30/2023 0900 Gross per 24 hour  Intake 250 ml  Output --  Net 250 ml    Vitals:  Vitals:   06/29/23 2002 06/29/23 2030 06/30/23 0459 06/30/23 0828  BP: (!) 111/98 (!) 105/58 109/68 (!) 122/59  Pulse: 70 71 66 70  Resp: 17 17 16 18   Temp: 97.8 F (36.6 C) 97.8 F (36.6 C) 97.6 F (36.4 C) (!) 97.5 F (36.4 C)  TempSrc: Oral Oral Oral Oral  SpO2: 100% 95% 99% 98%     Physical Exam:    General elderly male in bed in no acute distress HEENT normocephalic atraumatic extraocular movements intact sclera anicteric Neck supple trachea midline Lungs clear to auscultation bilaterally normal work of breathing at rest on room air Heart S1S2 no  rub Abdomen soft nontender nondistended Extremities 1-2+ edema lRLE and trace to 1+ edema LLE Psych normal mood and affect Neuro - awake and conversant. Oriented to person, recognizes spouse. Not oriented to year. States we are in a hospital but not sure of the name  Access: LUE AVF with bruit and thrill    Medications reviewed   Labs:     Latest Ref Rng & Units 06/29/2023    7:38 AM 06/28/2023    1:51 PM 06/28/2023    1:02 AM  BMP  Glucose 70 - 99 mg/dL 99  409  811   BUN 8 - 23 mg/dL 61  914  86   Creatinine 0.61 - 1.24 mg/dL 7.82  95.62  13.08   Sodium 135 - 145 mmol/L 132  124  123   Potassium 3.5 - 5.1 mmol/L 4.1  5.8  6.5   Chloride 98 - 111 mmol/L 90  84  85   CO2 22 - 32 mmol/L 24  20  22    Calcium  8.9 - 10.3 mg/dL 8.0  7.8  7.7    Outpatient HD orders:  [facility]  3 hours 15 minutes  EDW 91.5 kg  BF 300 ml/min DF 600 ml/min Monday and Friday  3K/2.5 ca Last post weight: 91.3 kg on 5/5 Meds: Mircera every 4 weeks but doesn't look like  has received in the past several weeks due to being hospitalized per [facility] RN; Venofer 50 mg weekly No activated vit D No heparin      Assessment/ Plan: Acute GI bleeding/ rectal bleeding: getting 2u prbc's. H/o recent RP bleed/ hematoma. On DAPT due to hx of CABG and recent AAA repair. GI consulting - appreciate assistance.  ESRD: usually on HD 2x per week, Mon/Friday. Missed 2-3 HD sessions prior to admission and got HD on 5/18 PM.   Recommend HD MWF while here given overload.  They agree   Renal panel today and daily    HTN: acceptable on current regimen  Hyperkalemia - improved with HD Anemia of esrd: compounded by the GI bleed with acute blood loss.  PRBC's per primary team.  Rec'd aranesp  on 60 mcg on 5/10 prior admit.  Start aranesp  150 mcg weekly on Tuesdays   Secondary hyperparathyroidism: hyperphos. Cont phoslo  with meals Recent AAA repair  Mild cognitive impairment - noted per charting  Atrial fib: is on BB,  not an a/c candidate per pmd Chronic syst HF: last echo EF 40%, G2DD   Disposition - per primary team   Nan Aver, MD 06/30/2023 11:30 AM

## 2023-06-30 NOTE — Progress Notes (Signed)
 PROGRESS NOTE  Ronald [name] ZOX:[id number] DOB: 1941-02-05   PCP: Omie Bickers, MD  Patient is from: Home.  Lives with his wife.  Lately ambulating by holding to furnitures and walls  DOA: 06/27/2023 LOS: 2  Chief complaints Chief Complaint  Patient presents with   Rectal Bleeding     Brief Narrative / Interim history: 83 year old M with PMH of CAD/CABG, HFmrEF, ESRD on HD MF, CVA, HTN, neurocognitive disorder, A-fib not on AC, AAA with retroperitoneal hematoma s/p EVAR on 5/17 by Dr. Fulton Job presented to [facility] with rectal bleed.    In ED, stable vitals.  Hgb 6.9 (8.7 on 5/13).  K6.5.  WBC 13k. Na 123.  Cr 13.  BUN 86.  CT angio raise concern for ischemic colitis.  Lactic acid 1.1>> 1.8.  Two units of blood ordered.  Nephrology, GI and vascular surgery consulted, and patient was transferred to [facility] for further evaluation.   On arrival here, vitals remained stable.  Confused and delirious.  K improved to 5.8.  BUN 104.  Bicarb 20.  AG 20.  Na 124.  WBC trended up to 26.8.  Hgb improved to 8.0 to 2 units.   Vascular surgery, GI and nephrology on board.  Hgb seems to level off between 7 and 8 after 3 units of blood.   Subjective: Seen and examined earlier this morning.  No major events overnight or this morning.  RN reports scant blood on the stool last night.  Hemoglobin seems to have leveled off between 7 and 8.  Patient reports pain all over.  Not able to elaborate.  He is not a great historian.  Does not appear to be in distress.  Objective: Vitals:   06/29/23 2002 06/29/23 2030 06/30/23 0459 06/30/23 0828  BP: (!) 111/98 (!) 105/58 109/68 (!) 122/59  Pulse: 70 71 66 70  Resp: 17 17 16 18   Temp: 97.8 F (36.6 C) 97.8 F (36.6 C) 97.6 F (36.4 C) (!) 97.5 F (36.4 C)  TempSrc: Oral Oral Oral Oral  SpO2: 100% 95% 99% 98%    Examination:  GENERAL: No apparent distress.  Nontoxic. HEENT: MMM.  Vision and hearing grossly intact.  NECK: Supple.  No  apparent JVD.  RESP:  No IWOB.  Fair aeration bilaterally. CVS:  RRR. Heart sounds normal.  AV fistula left forearm ABD/GI/GU: BS+. Abd soft, NTND.  MSK/EXT:   No apparent deformity.  BLE edema, right> left.  SKIN: Right groin wound DCI.  No ecchymosis or signs of hematoma.  Slightly firm NEURO: AA.  Oriented only to self.  Follows some commands.  No apparent focal neuro deficit. PSYCH: Calm. Normal affect.   Consultants:  Vascular surgery Gastroenterology Nephrology  Procedures: Hemodialysis  Microbiology summarized: Blood cultures NGTD  Assessment and plan: ABLA and GI bleed: Patient's wife reports dark blood clots from his nose, mouth and also blood in the stool.  Patient was on Plavix  and aspirin  after recent vascular procedure.  CT angio did not show extravasation.  Transfused 2 units on 5/18 and additional 1 unit on 7/19.  Reportedly, scant blood in the stool last night.  Hgb seems to have leveled off between 7 and 8.  No nutritional deficiency on anemia panel. Recent Labs    06/20/23 0941 06/22/23 0404 06/23/23 0901 06/28/23 0102 06/28/23 1351 06/28/23 1857 06/29/23 0738 06/29/23 1321 06/30/23 0024 06/30/23 0930  HGB 8.7* 8.7* 8.7* 6.9* 8.0* 7.4* 7.1* 7.0* 7.7* 7.6*  -Continue holding Plavix  and aspirin .  I do not know if we can resume those without endoscopic eval -Goal Hgb > 8.0 given CAD.  -Continue monitoring H&H -Continue IV PPI -On clear liquid diet. -GI on board.  Leukocytosis: WBC 26>>> 12.7.  Not toxic and hemodynamically stable.  Lactic acid 1.1> 1.8.  At risk for intra-abdominal infection given GI bleed.  Seems to have LLQ tenderness.  Blood cultures NGTD.  CT abdomen and pelvis as above. -Continue IV Zosyn  5/18>>> for total of 5 days -Continue monitoring leukocytosis   ESRD/azotemia/hyponatremia/hyperkalemia: On HD M and F: Hyperkalemia resolved.  Hyponatremia improved. -Dialysis by nephrology.  Had 2 L ultrafiltration this morning  Chronic HFmrEF:  TTE in 11/2022 with LVEF of 40%, G2-DD and moderate AAS.  Appears euvolemic except for right lower extremity edema.  No respiratory distress. -Fluid management by dialysis -Continue holding Toprol -XL.  BP soft.  Paroxysmal atrial fibrillation: Rate controlled.  Not on anticoagulation due to risk of bleeding -Continue home amiodarone  -Continue holding metoprolol  given soft BP.   History of CAD/CABG/chronic LBBB: No cardiopulmonary symptoms. -Hold aspirin  and Plavix  for now given GI bleed.  -Continue statin -Keep Hgb > 8.0.  AAA with retroperitoneal hematoma s/p EVAR on 5/17 by Dr. Fulton Job.  CT raises concern for ischemic colitis.  Lactic acid 1.1 and 1.8.  LLQ tenderness on exam.  No rebound or guarding. -No intervention from vascular standpoint.  -Antibiotics as above pending cultures. -Continue holding Plavix  and aspirin .   Severe dementia with delirium:  Awake and alert.  Only oriented to self.  Follows commands.  Seems to be his baseline.  No focal neurodeficit. -Reorientation and delirium precaution -Minimize sedating medications.   -Discontinue IV Dilaudid .  Decreased Ambien . -Fall precaution, abdominal restraints and safety sitter as needed  Prolonged QTc: 585>> 517 but in the setting of wide QRS/LBBB -Minimize QT prolonging drugs.   Lower extremity edema, right> left. -Follow LE venous Doppler.   Dyslipidemia - Continue statin   History of prostate cancer/BPH s/p  seed implantation, follows with Dr. Claretta Croft -Continue Flomax   Advance care planning: DNR-Limited.  See IPAL note for details.   There is no height or weight on file to calculate BMI.           DVT prophylaxis:  SCDs Start: 06/28/23 0746  Code Status: DNR-Limited Family Communication: None at bedside today. Level of care: Telemetry Medical Status is: Inpatient Remains inpatient appropriate because: Acute blood loss anemia, GI bleed, leukocytosis   Final disposition: To be determined   55  minutes with more than 50% spent in reviewing records, counseling patient/family and coordinating care.   Sch Meds:  Scheduled Meds:  sodium chloride    Intravenous Once   acetaminophen   650 mg Oral Q6H WA   amiodarone   200 mg Oral Daily   atorvastatin   80 mg Oral Daily   calcium  acetate  1,334 mg Oral TID WC   Chlorhexidine  Gluconate Cloth  6 each Topical Q0600   darbepoetin (ARANESP ) injection - NON-DIALYSIS  150 mcg Subcutaneous Once   mirtazapine   7.5 mg Oral QHS   pantoprazole  (PROTONIX ) IV  40 mg Intravenous Q12H   tamsulosin   0.4 mg Oral QPC supper   Continuous Infusions:  piperacillin -tazobactam (ZOSYN )  IV 2.25 g (06/30/23 1437)   PRN Meds:.HYDROmorphone  (DILAUDID ) injection, hydrOXYzine , zolpidem   Antimicrobials: Anti-infectives (From admission, onward)    Start     Dose/Rate Route Frequency Ordered Stop   06/28/23 2100  vancomycin  (VANCOCIN ) IVPB 1000 mg/200 mL premix  1,000 mg 200 mL/hr over 60 Minutes Intravenous Once in dialysis 06/28/23 2002 06/29/23 0509   06/28/23 1645  vancomycin  (VANCOREADY) IVPB 2000 mg/400 mL        2,000 mg 200 mL/hr over 120 Minutes Intravenous  Once 06/28/23 1555 06/28/23 1826   06/28/23 1554  vancomycin  variable dose per unstable renal function (pharmacist dosing)  Status:  Discontinued         Does not apply See admin instructions 06/28/23 1555 06/29/23 0749   06/28/23 1515  piperacillin -tazobactam (ZOSYN ) IVPB 2.25 g        2.25 g 100 mL/hr over 30 Minutes Intravenous Every 8 hours 06/28/23 1415          I have personally reviewed the following labs and images: CBC: Recent Labs  Lab 06/28/23 0102 06/28/23 1351 06/28/23 1857 06/29/23 0738 06/29/23 1321 06/30/23 0024 06/30/23 0930  WBC 13.0* 26.8*  --  19.4*  --   --  12.7*  HGB 6.9* 8.0* 7.4* 7.1* 7.0* 7.7* 7.6*  HCT 20.5* 23.1* 21.4* 21.0* 20.3* 23.3* 22.4*  MCV 88.4 86.8  --  88.2  --   --  87.2  PLT 293 243  --  210  --   --  200   BMP &GFR Recent Labs  Lab  06/28/23 0102 06/28/23 1351 06/29/23 0738 06/30/23 0024  NA 123* 124* 132* 133*  K 6.5* 5.8* 4.1 4.4  CL 85* 84* 90* 90*  CO2 22 20* 24 24  GLUCOSE 137* 113* 99 91  BUN 86* 104* 61* 71*  CREATININE 12.99* 13.71* 8.34* 9.62*  CALCIUM  7.7* 7.8* 8.0* 8.2*  MG  --   --  1.8  --   PHOS  --  6.7*  --  6.7*   Estimated Creatinine Clearance: 7 mL/min (A) (by C-G formula based on SCr of 9.62 mg/dL (H)). Liver & Pancreas: Recent Labs  Lab 06/28/23 0102 06/28/23 1351 06/29/23 0738 06/30/23 0024  AST 17  --  12*  --   ALT 5  --  <5  --   ALKPHOS 90  --  66  --   BILITOT 1.0  --  0.9  --   PROT 5.4*  --  5.0*  --   ALBUMIN  2.4* 2.2* 2.1* 2.1*   No results for input(s): "LIPASE", "AMYLASE" in the last 168 hours. No results for input(s): "AMMONIA" in the last 168 hours. Diabetic: No results for input(s): "HGBA1C" in the last 72 hours. No results for input(s): "GLUCAP" in the last 168 hours. Cardiac Enzymes: No results for input(s): "CKTOTAL", "CKMB", "CKMBINDEX", "TROPONINI" in the last 168 hours. No results for input(s): "PROBNP" in the last 8760 hours. Coagulation Profile: No results for input(s): "INR", "PROTIME" in the last 168 hours. Thyroid  Function Tests: No results for input(s): "TSH", "T4TOTAL", "FREET4", "T3FREE", "THYROIDAB" in the last 72 hours. Lipid Profile: No results for input(s): "CHOL", "HDL", "LDLCALC", "TRIG", "CHOLHDL", "LDLDIRECT" in the last 72 hours. Anemia Panel: Recent Labs    06/29/23 0945 06/29/23 1321  VITAMINB12 218  --   FOLATE 10.9  --   FERRITIN  --  733*  TIBC  --  155*  IRON  --  32*  RETICCTPCT 2.8  --    Urine analysis:    Component Value Date/Time   COLORURINE YELLOW 06/10/2023 1007   APPEARANCEUR CLEAR 06/10/2023 1007   APPEARANCEUR Clear 10/29/2022 0906   LABSPEC 1.006 06/10/2023 1007   PHURINE 7.0 06/10/2023 1007   GLUCOSEU NEGATIVE 06/10/2023 1007  HGBUR NEGATIVE 06/10/2023 1007   BILIRUBINUR NEGATIVE 06/10/2023 1007    BILIRUBINUR Negative 10/29/2022 0906   KETONESUR NEGATIVE 06/10/2023 1007   PROTEINUR 100 (A) 06/10/2023 1007   UROBILINOGEN 0.2 05/17/2019 0917   NITRITE NEGATIVE 06/10/2023 1007   LEUKOCYTESUR NEGATIVE 06/10/2023 1007   Sepsis Labs: Invalid input(s): "PROCALCITONIN", "LACTICIDVEN"  Microbiology: Recent Results (from the past 240 hours)  Culture, blood (Routine X 2) w Reflex to ID Panel     Status: None (Preliminary result)   Collection Time: 06/28/23  3:21 PM   Specimen: BLOOD RIGHT ARM  Result Value Ref Range Status   Specimen Description BLOOD RIGHT ARM  Final   Special Requests   Final    BOTTLES DRAWN AEROBIC AND ANAEROBIC Blood Culture results may not be optimal due to an inadequate volume of blood received in culture bottles   Culture   Final    NO GROWTH 2 DAYS Performed at [facility], [address] [place], [place] [address]    Report Status PENDING  Incomplete  Culture, blood (Routine X 2) w Reflex to ID Panel     Status: None (Preliminary result)   Collection Time: 06/28/23  3:23 PM   Specimen: BLOOD RIGHT HAND  Result Value Ref Range Status   Specimen Description BLOOD RIGHT HAND  Final   Special Requests   Final    BOTTLES DRAWN AEROBIC AND ANAEROBIC Blood Culture results may not be optimal due to an inadequate volume of blood received in culture bottles   Culture   Final    NO GROWTH 2 DAYS Performed at [facility], [address], [place], [place] [address]    Report Status PENDING  Incomplete    Radiology Studies: No results found.    Merrik Puebla T. Rejoice Heatwole [facility]  If 7PM-7AM, please contact night-coverage [email or web address] 06/30/2023, 3:07 PM

## 2023-06-30 NOTE — Progress Notes (Signed)
  Progress Note    06/30/2023 7:34 AM * No surgery found *  Subjective:  stable, mild abdominal pai    Vitals:   06/29/23 2030 06/30/23 0459  BP: (!) 105/58 109/68  Pulse: 71 66  Resp: 17 16  Temp: 97.8 F (36.6 C) 97.6 F (36.4 C)  SpO2: 95% 99%    Physical Exam: General:  resting, alert and oriented x3 Lungs:  nonlabored Incisions:  right groin incision well appearing Extremities:  palpable PT pulses Abdomen:  soft, nondistended, unchanged mild lower abdominal tenderness  CBC    Component Value Date/Time   WBC 19.4 (H) 06/29/2023 0738   RBC 2.35 (L) 06/29/2023 0945   RBC 2.38 (L) 06/29/2023 0738   HGB 7.7 (L) 06/30/2023 0024   HCT 23.3 (L) 06/30/2023 0024   PLT 210 06/29/2023 0738   MCV 88.2 06/29/2023 0738   MCH 29.8 06/29/2023 0738   MCHC 33.8 06/29/2023 0738   RDW 17.4 (H) 06/29/2023 0738   LYMPHSABS 1.3 09/12/2015 1015   MONOABS 0.6 09/12/2015 1015   EOSABS 0.2 09/12/2015 1015   BASOSABS 0.0 09/12/2015 1015    BMET    Component Value Date/Time   NA 132 (L) 06/29/2023 0738   K 4.1 06/29/2023 0738   CL 90 (L) 06/29/2023 0738   CO2 24 06/29/2023 0738   GLUCOSE 99 06/29/2023 0738   BUN 61 (H) 06/29/2023 0738   CREATININE 8.34 (H) 06/29/2023 0738   CALCIUM  8.0 (L) 06/29/2023 0738   GFRNONAA 6 (L) 06/29/2023 0738   GFRAA 15 (L) 08/10/2019 1109    INR    Component Value Date/Time   INR 1.4 (H) 06/17/2023 1839     Intake/Output Summary (Last 24 hours) at 06/30/2023 0734 Last data filed at 06/30/2023 0400 Gross per 24 hour  Intake 250 ml  Output --  Net 250 ml      Assessment/Plan:  83 y.o. male admitted with melena   -His right groin incision is well appearing and intact -Unchanged abdominal pain from yesterday. Mild tenderness on exam. His abdomen is soft -BLE well perfused with palpable PT pulses  -No concern for bleeding from AAA endograft repair   Deneise Finlay, PA-C [facility] [phone] 06/30/2023 7:34  AM

## 2023-06-30 NOTE — Plan of Care (Signed)
  Problem: Pain Managment: Goal: General experience of comfort will improve and/or be controlled Outcome: Progressing   Problem: Safety: Goal: Ability to remain free from injury will improve Outcome: Progressing

## 2023-06-30 NOTE — Progress Notes (Signed)
 Patient had to go to the bathroom with assistance 3 to 4x. Had blood on his bowel movement scanty in amount.

## 2023-06-30 NOTE — Progress Notes (Signed)
 [facility] ROUNDING NOTE   Subjective: Patient passed small blood tinged stool with mucus this afternoon right before I saw him.  No large volume stools or frank GI bleeding.  Patient's wife at bedside and states that patient has not been eating much because the food is not appealing.  Patient provides little input, but denies significant abdominal pain, nausea/vomiting.  Leukocytosis improving rapidly.  Hgb stable around 7-8   Objective: Vital signs in last 24 hours: Temp:  [97.5 F (36.4 C)-97.8 F (36.6 C)] 97.5 F (36.4 C) (05/20 1542) Pulse Rate:  [64-71] 64 (05/20 1542) Resp:  [16-18] 18 (05/20 1542) BP: (105-126)/(58-68) 126/61 (05/20 1542) SpO2:  [95 %-100 %] 100 % (05/20 1542) Last BM Date : 06/30/23 General: NAD, frail elderly male Lungs:  CTA b/l, no w/r/r Heart:  RRR, no m/r/g Abdomen:  Soft, mild tenderness to palpation in the LLQ, ND, +BS     Intake/Output from previous day: 05/19 0701 - 05/20 0700 In: 250 [P.O.:100; IV Piggyback:150] Out: -  Intake/Output this shift: No intake/output data recorded.   Lab Results: Recent Labs    06/28/23 1351 06/28/23 1857 06/29/23 0738 06/29/23 1321 06/30/23 0024 06/30/23 0930  WBC 26.8*  --  19.4*  --   --  12.7*  HGB 8.0*   < > 7.1* 7.0* 7.7* 7.6*  PLT 243  --  210  --   --  200  MCV 86.8  --  88.2  --   --  87.2   < > = values in this interval not displayed.   BMET Recent Labs    06/28/23 1351 06/29/23 0738 06/30/23 0024  NA 124* 132* 133*  K 5.8* 4.1 4.4  CL 84* 90* 90*  CO2 20* 24 24  GLUCOSE 113* 99 91  BUN 104* 61* 71*  CREATININE 13.71* 8.34* 9.62*  CALCIUM  7.8* 8.0* 8.2*   LFT Recent Labs    06/28/23 0102 06/28/23 1351 06/29/23 0738 06/30/23 0024  PROT 5.4*  --  5.0*  --   ALBUMIN  2.4* 2.2* 2.1* 2.1*  AST 17  --  12*  --   ALT 5  --  <5  --   ALKPHOS 90  --  66  --   BILITOT 1.0  --  0.9  --    PT/INR No results for input(s): "INR" in the last 72  hours.    Imaging/Other results: No results found.    Assessment and Plan:  83 year old gentleman with an extensive past medical history noteworthy for ESRD on HD, HTN, CAD/CABG, A-fib and AAA status post repair with stent and angioplasty 06/17/2023 (on ASA and Plavix ) admitted to the hospital with onset of melena, coffee-ground emesis and anemia on 06/27/2023 in addition to hyperkalemia and significantly elevated creatinine after missing hemodialysis.  Clinical history very consistent with an upper GI bleed, however CT scan also showed evidence suggestive of ischemic colitis.  He has not had any further evidence of upper GI bleeding and his hgb has remained stable.  He is passing some mucus/blood-tinged stool and has LLQ abdominal pain which could be consistent with resolving ischemic colitis.  It is not clear that benefits of an endoscopic evaluation outweigh the risks at this point, and neither the patient nor his wife would like to pursue an endoscopy given resolution of bleeding.    Upper GI bleed, seems unlikely to be related to endograft Appears resolved with acid suppression and cessation of antiplatelet therapy -Continue Protonix  40 mg IV twice daily;  ok to transition to PO -Advance diet to regular -Continue to hold Plavix  for now; would recommend resuming on Thursday if no bleeding after advancing diet   Possible ischemic colitis Based on CT findings, no clear clinical history, but is tender in left lower quadrant - No plans for colonoscopy - Can continue supportive care, complete 5 day course of abx   ESRD/hyperkalemia - Hyperkalemia resolved - Continue HD per nephrology   Leukocytosis - Unclear source, possibly ischemic colitis, treated empirically with broad spectum abx    AAA s/p EVAR 5/7 - Vascular following, ok to hold ASA/Plavix   GI will sign off a this time given resolution of bleeding.  Please reconsult if patient demonstrates recurrent bleeding and is willing to  undergo an endoscopy.  Elois Hair, MD  06/30/2023, 8:03 PM [facility]

## 2023-06-30 NOTE — Discharge Summary (Signed)
 [facility] Discharge Summary   Ronald [name] Al April 16, 1940 83 y.o. male  MRN: [id number]  Admission Date: 06/17/2023  Discharge Date: 06/23/2023  Physician: No att. providers found  Admission Diagnosis: Infrarenal abdominal aortic aneurysm (AAA) without rupture (HCC) [I71.43] AAA (abdominal aortic aneurysm) (HCC) [I71.40]  Discharge Day services:   Hemodialysis Social Work PT/ OT  Hospital Course:  The patient was admitted to the hospital and taken to the operating room on 06/17/2023 and underwent:  1.  Ultrasound-guided access right common femoral artery with delivery of Perclose closure device 2.  Ultrasound-guided access of left common femoral artery 3.  Abdominal aortogram with catheter selection of abdominal aorta and catheter selection SMA 4.  Abdominal aortic stent graft using a physician modified endoprosthesis (Gore 45 mm x 10 cm thoracic endograft with back table modification creating scallop for SMA) 5.  Angioplasty and stent of right external iliac artery (10 mm x 60 mm epic stent post-dilated with a 9 mm Mustang) 6.  Cutdown on the right common femoral artery for open common femoral exposure 7.  Right common femoral endarterectomy including endarterectomy of the proximal SFA and profunda 8.  Right lower extremity angiogram 9.  Endarterectomy of the proximal SFA with bovine patch angioplasty by Dr. Susi Eric and Dr. Fulton Job. He received 2 units PRBC intraoperatively.  The pt tolerated the procedure well and was transported to the PACU in stable condition.   Nephrology was consulted post operatively for inpatient hemodialysis management  Patient remained stable in post operative area and was transferred to the 4E floor.  5/8 early in the morning of POD#1 he had removed his telemetry monitors. Some overnight confusion. Otherwise doing well. No abdominal pain. Brisk PT doppler signals bilaterally. Right groin incision intact and well appearing. Acute blood loss anemia but  stable post transfusion of 2 units. Hemoglobin 9.5. some hyperkalemia. Underwent inpatient HD. Continued on Plavix  and Aspirin . Serial labs ordered. Later in afternoon after hemodialysis he was noted to be in atrial fibrillation with HR in the 116-145. EKG performed. Rapid response notified as well as Dr. Fulton Job. Cardiology was consulted for new onset atrial fibrillation. He remained asymptomatic. Recommendation was to start IV amiodarone . Not a candidate for anticoagulation due to history of multiple falls. Later had some confusion and hypotension. 2 more units of PRBC were ordered. Otherwise stable. Troponin finally came back > 1500. Cardiology made aware.    5/9 more confusion overnight. Complaining of right lower abdominal pain. Right groin well appearing and soft without hematoma. Tenderness present in RLQ. Stat CT was ordered. Serial labs ordered. Hemodynamically stable. Hospitalists consulted for medical management. Both legs remained well perfused and warm. Heart rate improved on amiodarone . Amlodipine  and carvedilol  held her Hospitalists recommendation as well as Ambien .  5/10 increased agitation and confusion overnight. Required sitter and soft mittens. Still some RLQ tenderness. Groins well appearing. Mild ecchymosis in right groin. CT scan showing Retroperitoneal bleed but not active extravasation. Extremities well perfused. Otherwise hemodynamically stable. Remained stable from Cardiology standpoint.   5/11 more alter. No longer required sitter or mittens. Abdominal pain stable. No evidence of ongoing bleeding. Labs all stable. PT/OT consulted for mobility evaluation. Transitioned to oral Amiodarone  200 mg twice daily. IV amiodarone  discontinued. PT recommended  Home health PT. This was arranged with [facility]. DME needs also placed.   The remainder of the hospital course consisted of increasing mobilization and increasing intake of solids without difficulty. Underwent his usual 2x/ week  Hemodialysis sessions per Nephrology. Did not tolerate full  HD on 3/12 due to confusion and pulling out his needles. Was scheduled to go home but in discussion with his wife recommended that he stay one more day and complete full session of HD prior to discharge.   5/13 he remained stable for discharge home. Completed full session of HD without issues. He will discharge home on Aspirin , Plavix  and statin. No full anticoagulation for atrial fibrillation due to RP bleed. Will continue on Amiodarone  x 1 week then daily for 2 weeks, Toprol  50mg  daily and outpatient cardiology follow up. He will follow up with Dr. Fulton Job in 1 month with CTA abdomen.  CBC    Component Value Date/Time   WBC 19.4 (H) 06/29/2023 0738   RBC 2.35 (L) 06/29/2023 0945   RBC 2.38 (L) 06/29/2023 0738   HGB 7.7 (L) 06/30/2023 0024   HCT 23.3 (L) 06/30/2023 0024   PLT 210 06/29/2023 0738   MCV 88.2 06/29/2023 0738   MCH 29.8 06/29/2023 0738   MCHC 33.8 06/29/2023 0738   RDW 17.4 (H) 06/29/2023 0738   LYMPHSABS 1.3 09/12/2015 1015   MONOABS 0.6 09/12/2015 1015   EOSABS 0.2 09/12/2015 1015   BASOSABS 0.0 09/12/2015 1015    BMET    Component Value Date/Time   NA 132 (L) 06/29/2023 0738   K 4.1 06/29/2023 0738   CL 90 (L) 06/29/2023 0738   CO2 24 06/29/2023 0738   GLUCOSE 99 06/29/2023 0738   BUN 61 (H) 06/29/2023 0738   CREATININE 8.34 (H) 06/29/2023 0738   CALCIUM  8.0 (L) 06/29/2023 0738   GFRNONAA 6 (L) 06/29/2023 0738   GFRAA 15 (L) 08/10/2019 1109       Discharge Instructions     ABDOMINAL PROCEDURE/ANEURYSM REPAIR/AORTO-BIFEMORAL BYPASS:  Call MD for increased abdominal pain; cramping diarrhea; nausea/vomiting   Complete by: As directed    Call MD for:  redness, tenderness, or signs of infection (pain, swelling, bleeding, redness, odor or green/yellow discharge around incision site)   Complete by: As directed    Call MD for:  severe or increased pain, loss or decreased feeling  in affected limb(s)    Complete by: As directed    Call MD for:  temperature >100.5   Complete by: As directed    Discharge wound care:   Complete by: As directed    Wash incisions daily with mild soap and water, pat dry. Do not soak in bathtub. If you need to tuck dry gauze in groin to wick moisture   Driving Restrictions   Complete by: As directed    No driving for 3 weeks   Increase activity slowly   Complete by: As directed    Walk with assistance use walker or cane as needed   Lifting restrictions   Complete by: As directed    No lifting for 6 weeks   Resume previous diet   Complete by: As directed    Walk with assistance   Complete by: As directed        Discharge Diagnosis:  Infrarenal abdominal aortic aneurysm (AAA) without rupture (HCC) [I71.43] AAA (abdominal aortic aneurysm) (HCC) [I71.40]  Secondary Diagnosis: Patient Active Problem List   Diagnosis Date Noted   Advance care planning 06/29/2023   DNR (do not resuscitate) 06/29/2023   Hyperkalemia 06/29/2023   Ischemic colitis (HCC) 06/29/2023   Upper GI bleed 06/28/2023   Status post AAA (abdominal aortic aneurysm) repair 06/22/2023   Acute anemia 06/22/2023   Nonrheumatic aortic valve stenosis 06/22/2023   Chronic heart  failure with mildly reduced ejection fraction (HFmrEF, 41-49%) (HCC) 06/22/2023   Atrial fibrillation with controlled ventricular response (HCC) 06/20/2023   Persistent atrial fibrillation (HCC) 06/18/2023   AAA (abdominal aortic aneurysm) (HCC) 06/17/2023   Moderate aortic valve stenosis 04/29/2023   Edema of lower extremity 12/18/2022   Nicotine dependence, cigarettes, uncomplicated 11/27/2022   Acute non-ST segment elevation myocardial infarction [facility]) 11/27/2022   Chronic obstructive pulmonary disease (HCC) 11/27/2022   Paroxysmal A-fib (HCC) 11/12/2022   NSTEMI (non-ST elevated myocardial infarction) (HCC) 11/12/2022   Hx of CABG 11/12/2022   Chronic congestive heart failure (HCC) 11/12/2022   Tobacco  abuse 11/12/2022   Skin lesion 04/09/2022   Age-related macular degeneration 04/09/2022   Anemia 04/09/2022   Benign prostatic hyperplasia 04/09/2022   Glaucoma 04/09/2022   Moderate cognitive impairment 07/05/2021   End-stage renal disease on hemodialysis (HCC) 03/14/2021   Secondary hyperparathyroidism (HCC) 03/14/2021   Abdominal aortic aneurysm (HCC) 03/14/2021   Atherosclerosis of coronary artery without angina pectoris 03/14/2021   Chronic insomnia 03/14/2021   Chronic pain syndrome 03/14/2021   Diarrhea 03/14/2021   Gastroesophageal reflux disease without esophagitis 03/14/2021   Mixed hyperlipidemia 03/14/2021   Gross hematuria 04/13/2020   Acute prostatitis 04/13/2020   Essential hypertension 05/04/2019   Chronic kidney disease, stage IV (severe) (HCC) 05/04/2019   Primary malignant neoplasm of prostate (HCC) 11/10/2017   Past Medical History:  Diagnosis Date   CHF (congestive heart failure) (HCC)    ESRD on hemodialysis (HCC)    GERD (gastroesophageal reflux disease)    Glaucoma    Hypertension    Macular degeneration    Myocardial infarction (HCC)    1999   Prostate cancer (HCC) 02/2018   Stroke (HCC)    no deficits; had stroke while trying to place "stents in legs".     Allergies as of 06/23/2023   No Known Allergies      Medication List     TAKE these medications    amiodarone  200 MG tablet Commonly known as: PACERONE  Take 1 tablet (200 mg total) by mouth 2 (two) times daily for 7 days, THEN 1 tablet (200 mg total) daily for 14 days. Start taking on: Jun 23, 2023   amLODipine  5 MG tablet Commonly known as: NORVASC  Take 5 mg by mouth at bedtime.   aspirin  EC 81 MG tablet Take 1 tablet (81 mg total) by mouth daily. Swallow whole.   atorvastatin  80 MG tablet Commonly known as: LIPITOR Take 80 mg by mouth in the morning.   calcium  acetate 667 MG capsule Commonly known as: PHOSLO  Take 1,334 mg by mouth 3 (three) times daily. citracal    carvedilol  25 MG tablet Commonly known as: COREG  Take 1 tablet (25 mg total) by mouth 2 (two) times daily with a meal.   clopidogrel  75 MG tablet Commonly known as: PLAVIX  Take 1 tablet (75 mg total) by mouth daily.   metoprolol  succinate 50 MG 24 hr tablet Commonly known as: Toprol  XL Take 1 tablet (50 mg total) by mouth daily. Take with or immediately following a meal.   mirtazapine  15 MG tablet Commonly known as: REMERON  Take 15 mg by mouth at bedtime.   pantoprazole  40 MG tablet Commonly known as: PROTONIX  Take 40 mg by mouth in the morning.   tamsulosin  0.4 MG Caps capsule Commonly known as: FLOMAX  Take 1 capsule (0.4 mg total) by mouth daily after supper.   traMADol  50 MG tablet Commonly known as: ULTRAM  Take 50 mg by mouth  every 8 (eight) hours as needed (pain.).   zolpidem  10 MG tablet Commonly known as: AMBIEN  Take 10 mg by mouth at bedtime as needed for sleep.               Discharge Care Instructions  (From admission, onward)           Start     Ordered   06/23/23 0000  Discharge wound care:       Comments: Wash incisions daily with mild soap and water, pat dry. Do not soak in bathtub. If you need to tuck dry gauze in groin to wick moisture   06/23/23 0940            Discharge Instructions:   [facility]  Discharge Instructions Endovascular Aortic Aneurysm Repair  Please refer to the following instructions for your post-procedure care. Your surgeon or Physician Assistant will discuss any changes with you.  Activity  You are encouraged to walk as much as you can. You can slowly return to normal activities but must avoid strenuous activity and heavy lifting until your doctor tells you it's OK. Avoid activities such as vacuuming or swinging a gold club. It is normal to feel tired for several weeks after your surgery. Do not drive until your doctor gives the OK and you are no longer taking prescription pain  medications. It is also normal to have difficulty with sleep habits, eating, and bowel movements after surgery. These will go away with time.  Bathing/Showering  You may shower after you go home. If you have an incision, do not soak in a bathtub, hot tub, or swim until the incision heals completely.  Incision Care  Shower every day. Clean your incision with mild soap and water. Pat the area dry with a clean towel. You do not need a bandage unless otherwise instructed. Do not apply any ointments or creams to your incision. If you clothing is irritating, you may cover your incision with a dry gauze pad.  Diet  Resume your normal diet. There are no special food restrictions following this procedure. A low fat/low cholesterol diet is recommended for all patients with vascular disease. In order to heal from your surgery, it is CRITICAL to get adequate nutrition. Your body requires vitamins, minerals, and protein. Vegetables are the best source of vitamins and minerals. Vegetables also provide the perfect balance of protein. Processed food has little nutritional value, so try to avoid this.  Medications  Resume taking all of your medications unless your doctor or Physician Assistnat tells you not to. If your incision is causing pain, you may take over-the-counter pain relievers such as acetaminophen  (Tylenol ). If you were prescribed a stronger pain medication, please be aware these medications can cause nausea and constipation. Prevent nausea by taking the medication with a snack or meal. Avoid constipation by drinking plenty of fluids and eating foods with a high amount of fiber, such as fruits, vegetables, and grains. Do not take Tylenol  if you are taking prescription pain medications.   Follow up  Our office will schedule a follow-up appointment with a C.T. scan 3-4 weeks after your surgery.  Please call us  immediately for any of the following conditions  Severe or worsening pain in your legs  or feet or in your abdomen back or chest. Increased pain, redness, drainage (pus) from your incision sit. Increased abdominal pain, bloating, nausea, vomiting or persistent diarrhea. Fever of 101 degrees or higher. Swelling in your leg (s),  Reduce your risk of vascular disease  Stop smoking. If you would like help call [facility] at 1-800-QUIT-NOW (4704878392) or [facility] at 3171882541. Manage your cholesterol Maintain a desired weight Control your diabetes Keep your blood pressure down  If you have questions, please call the office at [phone].    Prescriptions given: Amiodarone  200 mg 2 tablets BID x 7 days, then 1 tablet  for 14 days#300 No Refill Plavix  75 mg daily #30 11 refills Metoprolol  50 mg daily #30 11 refills  Disposition: home  Patient's condition: is Fair  Follow up: 1. Dr. Fulton Job in 4 weeks with CTA protocol   Deneen Finical, PA-C [facility] [phone] 06/30/2023  9:01 AM   - For VQI Registry use - Post-op:  Time to Extubation: [C] In OR, [ ]  < 12 hrs, [ ]  12-24 hrs, [ ]  >=24 hrs Vasopressors Req. Post-op: No MI: No., [ ]  Troponin only, [ ]  EKG or Clinical New Arrhythmia: No CHF: No ICU Stay: 0 day in ICU Transfusion: Yes     If yes, 4 units given  Complications: Resp failure: No., [ ]  Pneumonia, [ ]  Ventilator Chg in renal function: Yes.  , [employer] Cr > 0.5, [ ]  Temp. Dialysis,  [ ]  Permanent dialysis Leg ischemia: No., no Surgery needed, [ ]  Yes, Surgery needed,  [ ]  Amputation Bowel ischemia: No., [ ]  Medical Rx, [ ]  Surgical Rx Wound complication: No., [ ]  Superficial separation/infection, [ ]  Return to OR Return to OR: No  Return to OR for bleeding: No Stroke: No., [ ]  Minor, [ ]  Major  Discharge medications: Statin use:  Yes  ASA use:  Yes  Plavix  use:  Yes  Beta blocker use:  Yes  ARB use:  No ACEI use:  No CCB use:  No

## 2023-06-30 NOTE — Evaluation (Signed)
 Physical Therapy Evaluation Patient Details Name: Ronald [name] MRN: [id number] DOB: October 18, 1940 Today's Date: 06/30/2023  History of Present Illness  83 year old M admitted  5/17 with ABLA and GI bleed, Leukocytosis, azotemia/hyponatremia/hyperkalemia. PMH of CAD/CABG, HFmrEF, ESRD on HD MF, CVA, HTN, neurocognitive disorder, A-fib not on AC, AAA with retroperitoneal hematoma s/p [facility] on 5/17, galucoma, macular degeneration, MI, CAD s/p CABG, prostate cancer.  Clinical Impression  Pt admitted with above diagnosis. Wife reports pt with steady decline since Saturday, now requiring physical assist with transfers and most ADLs. Prior to recent previous admission pt able furniture walk in home and needed light assist with some ADLs. He required up to mod assist for transfer from bed today, min assist from toilet with rail. Ambulates short distances in room with min assist and RW for support, erratic control of device, but attribute some of this due to bowel urgency. Scant blood and mucous in toilet (GI MD present and observed.) Patient will benefit from intensive inpatient follow-up therapy, >3 hours/day. Hopeful pt will progress quickly to a level that wife can safely manage. Pt currently with functional limitations due to the deficits listed below (see PT Problem List). Pt will benefit from acute skilled PT to increase their independence and safety with mobility to allow discharge.           If plan is discharge home, recommend the following: A little help with bathing/dressing/bathroom;Assistance with cooking/housework;Direct supervision/assist for medications management;Direct supervision/assist for financial management;Assist for transportation;Help with stairs or ramp for entrance;A lot of help with walking and/or transfers   Can travel by private vehicle        Equipment Recommendations None recommended by PT  Recommendations for Other Services  Rehab consult    Functional Status  Assessment Patient has had a recent decline in their functional status and demonstrates the ability to make significant improvements in function in a reasonable and predictable amount of time.     Precautions / Restrictions Precautions Precautions: Fall Recall of Precautions/Restrictions: Intact Restrictions Weight Bearing Restrictions Per Provider Order: No      Mobility  Bed Mobility Overal bed mobility: Needs Assistance Bed Mobility: Supine to Sit, Sit to Supine     Supine to sit: HOB elevated, Contact guard Sit to supine: Contact guard assist, HOB elevated   General bed mobility comments: CGA for safety. A little effortful but no physical assist needed.    Transfers Overall transfer level: Needs assistance Equipment used: Rolling walker (2 wheels) Transfers: Sit to/from Stand Sit to Stand: Mod assist, From elevated surface           General transfer comment: Mod assist for boost to stand from elevated bed surface of bed. Min assist from toilet. Cues for technique, hand placement, and RW to steady himself upon standing.    Ambulation/Gait Ambulation/Gait assistance: Min assist Gait Distance (Feet): 16 Feet (x2) Assistive device: Rolling walker (2 wheels) Gait Pattern/deviations: Step-through pattern, Decreased stride length, Trunk flexed, Drifts right/left Gait velocity: decreased Gait velocity interpretation: <1.31 ft/sec, indicative of household ambulator   General Gait Details: Slightly erratic control of RW ambulating to bathroom, possibly due to urgency. Cues for safety, awareness, and RW placement for maximum support. Showed better control, with light min assist for balance and navigating smoothly around obstacles in room. Trunk flexed, heavy reliance on RW for support.  Stairs            Wheelchair Mobility     Tilt Bed    Modified Rankin (Stroke  Patients Only)       Balance Overall balance assessment: Needs assistance Sitting-balance  support: No upper extremity supported, Feet supported Sitting balance-Leahy Scale: Fair     Standing balance support: During functional activity, Single extremity supported Standing balance-Leahy Scale: Poor Standing balance comment: Single UE to wipe bottom after toileting.                             Pertinent Vitals/Pain Pain Assessment Pain Assessment: No/denies pain    Home Living Family/patient expects to be discharged to:: Private residence Living Arrangements: Spouse/significant other Available Help at Discharge: Family;Available 24 hours/day Type of Home: House Home Access: Stairs to enter Entrance Stairs-Rails: Left Entrance Stairs-Number of Steps: 5   Home Layout: One level Home Equipment: Agricultural consultant (2 wheels);Cane - single point;Hand held shower head;Grab bars - tub/shower      Prior Function Prior Level of Function : Independent/Modified Independent             Mobility Comments: Just prior to previous admission, pt not using DME, no falls, hold walls when walking. largely sedentary sitting in office chair in kitchen. Wife reports he could walk on his own. Since discharge pt has required increased assistance to transfer and has become increasinly confused. ADLs Comments: Prior to recent admission, pt needed some light assist from wife with ADLs. States since last d/c pt has required more assist than usual with most tasks.     Extremity/Trunk Assessment   Upper Extremity Assessment Upper Extremity Assessment: Defer to OT evaluation    Lower Extremity Assessment Lower Extremity Assessment: Generalized weakness    Cervical / Trunk Assessment Cervical / Trunk Assessment: Kyphotic  Communication   Communication Communication: No apparent difficulties    Cognition Arousal: Alert Behavior During Therapy: Flat affect   PT - Cognitive impairments: History of cognitive impairments, Problem solving, Safety/Judgement, Sequencing, Initiation,  Attention, Memory, Awareness                       PT - Cognition Comments: Wife reports pt not at cognitive baseline. Able to follow commands and state needs appropriately Following commands: Intact       Cueing Cueing Techniques: Verbal cues     General Comments General comments (skin integrity, edema, etc.): scant amount of blood and mucous in toilet. GI MD  in room to assess.    Exercises     Assessment/Plan    PT Assessment Patient needs continued PT services  PT Problem List Decreased strength;Decreased range of motion;Decreased activity tolerance;Decreased balance;Decreased mobility;Decreased safety awareness;Decreased coordination;Decreased cognition;Decreased knowledge of use of DME;Decreased knowledge of precautions       PT Treatment Interventions Gait training;DME instruction;Stair training;Functional mobility training;Therapeutic activities;Therapeutic exercise;Balance training;Patient/family education;Neuromuscular re-education;Cognitive remediation    PT Goals (Current goals can be found in the Care Plan section)  Acute Rehab PT Goals Patient Stated Goal: return to independence PT Goal Formulation: With patient/family Time For Goal Achievement: 07/19/23 Potential to Achieve Goals: Good    Frequency Min 2X/week     Co-evaluation               AM-PAC PT "6 Clicks" Mobility  Outcome Measure Help needed turning from your back to your side while in a flat bed without using bedrails?: A Little Help needed moving from lying on your back to sitting on the side of a flat bed without using bedrails?: A Little Help needed moving to  and from a bed to a chair (including a wheelchair)?: A Lot Help needed standing up from a chair using your arms (e.g., wheelchair or bedside chair)?: A Lot Help needed to walk in hospital room?: A Little Help needed climbing 3-5 steps with a railing? : A Lot 6 Click Score: 15    End of Session Equipment Utilized During  Treatment: Gait belt Activity Tolerance: Patient tolerated treatment well Patient left: with bed alarm set;with call bell/phone within reach;in bed (GI MD in romo) Nurse Communication: Mobility status PT Visit Diagnosis: Unsteadiness on feet (R26.81);Other abnormalities of gait and mobility (R26.89);Muscle weakness (generalized) (M62.81);Other symptoms and signs involving the nervous system (R29.898)    Time: 1610-9604 PT Time Calculation (min) (ACUTE ONLY): 17 min   Charges:   PT Evaluation $PT Eval Moderate Complexity: 1 Mod   PT General Charges $$ ACUTE PT VISIT: 1 Visit         Jory Ng, PT, DPT [facility]  Rehabilitation Services Physical Therapist Office: [phone] Website: [email or web address]   Alinda Irani 06/30/2023, 4:47 PM

## 2023-06-30 NOTE — Plan of Care (Signed)
  Problem: Nutrition: Goal: Adequate nutrition will be maintained Outcome: Progressing   Problem: Coping: Goal: Level of anxiety will decrease Outcome: Progressing   Problem: Elimination: Goal: Will not experience complications related to bowel motility Outcome: Progressing   Problem: Safety: Goal: Ability to remain free from injury will improve Outcome: Progressing   

## 2023-06-30 NOTE — Progress Notes (Signed)
 Pt receives out-pt HD at [facility] on Mondays and Fridays with 10:15 am chair time. Will assist as needed.   Lauraine Polite Renal Navigator [phone]

## 2023-06-30 NOTE — Progress Notes (Signed)
  Inpatient Rehabilitation Admissions Coordinator   Per therapy recommendations patient was screened for CIR candidacy by Jeannetta Millman RN MSN. Current payor trends with [employer] medicare are unlikley to approve a CIR/AIR level rehab for this diagnosis. I will not place a Rehab Conuslt at this time. Recommend other rehab venues to be pursued.   Jeannetta Millman, RN, MSN Rehab Admissions Coordinator [phone] 06/30/2023 5:18 PM

## 2023-07-01 ENCOUNTER — Encounter (HOSPITAL_COMMUNITY)

## 2023-07-01 ENCOUNTER — Inpatient Hospital Stay (HOSPITAL_COMMUNITY)

## 2023-07-01 DIAGNOSIS — K922 Gastrointestinal hemorrhage, unspecified: Secondary | ICD-10-CM | POA: Diagnosis not present

## 2023-07-01 DIAGNOSIS — Z9889 Other specified postprocedural states: Secondary | ICD-10-CM | POA: Diagnosis not present

## 2023-07-01 DIAGNOSIS — R4189 Other symptoms and signs involving cognitive functions and awareness: Secondary | ICD-10-CM | POA: Diagnosis not present

## 2023-07-01 DIAGNOSIS — I714 Abdominal aortic aneurysm, without rupture, unspecified: Secondary | ICD-10-CM | POA: Diagnosis not present

## 2023-07-01 LAB — CBC
HCT: 23.2 % — ABNORMAL LOW (ref 39.0–52.0)
Hemoglobin: 7.6 g/dL — ABNORMAL LOW (ref 13.0–17.0)
MCH: 29.5 pg (ref 26.0–34.0)
MCHC: 32.8 g/dL (ref 30.0–36.0)
MCV: 89.9 fL (ref 80.0–100.0)
Platelets: 209 10*3/uL (ref 150–400)
RBC: 2.58 MIL/uL — ABNORMAL LOW (ref 4.22–5.81)
RDW: 17.2 % — ABNORMAL HIGH (ref 11.5–15.5)
WBC: 10.5 10*3/uL (ref 4.0–10.5)
nRBC: 0 % (ref 0.0–0.2)

## 2023-07-01 LAB — RENAL FUNCTION PANEL
Albumin: 2.1 g/dL — ABNORMAL LOW (ref 3.5–5.0)
Anion gap: 20 — ABNORMAL HIGH (ref 5–15)
BUN: 83 mg/dL — ABNORMAL HIGH (ref 8–23)
CO2: 22 mmol/L (ref 22–32)
Calcium: 8.1 mg/dL — ABNORMAL LOW (ref 8.9–10.3)
Chloride: 92 mmol/L — ABNORMAL LOW (ref 98–111)
Creatinine, Ser: 11.49 mg/dL — ABNORMAL HIGH (ref 0.61–1.24)
GFR, Estimated: 4 mL/min — ABNORMAL LOW (ref >60)
Glucose, Bld: 83 mg/dL (ref 70–99)
Phosphorus: 8.2 mg/dL — ABNORMAL HIGH (ref 2.5–4.6)
Potassium: 4.3 mmol/L (ref 3.5–5.1)
Sodium: 134 mmol/L — ABNORMAL LOW (ref 135–145)

## 2023-07-01 LAB — PREPARE RBC (CROSSMATCH)

## 2023-07-01 LAB — MAGNESIUM: Magnesium: 2 mg/dL (ref 1.7–2.4)

## 2023-07-01 MED ORDER — PANTOPRAZOLE SODIUM 40 MG PO TBEC
40.0000 mg | DELAYED_RELEASE_TABLET | Freq: Two times a day (BID) | ORAL | Status: DC
  Administered 2023-07-01 – 2023-07-02 (×4): 40 mg via ORAL
  Filled 2023-07-01 (×4): qty 1

## 2023-07-01 MED ORDER — NEPRO/CARBSTEADY PO LIQD
237.0000 mL | Freq: Two times a day (BID) | ORAL | Status: DC
  Administered 2023-07-01 – 2023-07-02 (×3): 237 mL via ORAL

## 2023-07-01 MED ORDER — METOPROLOL SUCCINATE 12.5 MG HALF TABLET
12.5000 mg | ORAL_TABLET | Freq: Every day | ORAL | Status: DC
  Administered 2023-07-01 – 2023-07-02 (×2): 12.5 mg via ORAL
  Filled 2023-07-01 (×2): qty 1

## 2023-07-01 MED ORDER — SODIUM CHLORIDE 0.9% IV SOLUTION: Freq: Once | INTRAVENOUS | Status: DC

## 2023-07-01 MED ORDER — AMOXICILLIN-POT CLAVULANATE 500-125 MG PO TABS
1.0000 | ORAL_TABLET | Freq: Every day | ORAL | Status: DC
  Administered 2023-07-01 – 2023-07-02 (×2): 1 via ORAL
  Filled 2023-07-01 (×3): qty 1

## 2023-07-01 MED ORDER — LORAZEPAM 0.5 MG PO TABS
0.5000 mg | ORAL_TABLET | Freq: Once | ORAL | Status: DC
  Filled 2023-07-01: qty 1

## 2023-07-01 MED ORDER — HYDROXYZINE HCL 25 MG PO TABS
25.0000 mg | ORAL_TABLET | Freq: Three times a day (TID) | ORAL | Status: DC | PRN
  Administered 2023-07-01 – 2023-07-02 (×3): 25 mg via ORAL
  Filled 2023-07-01 (×3): qty 1

## 2023-07-01 MED ORDER — NICOTINE 21 MG/24HR TD PT24
21.0000 mg | MEDICATED_PATCH | Freq: Every day | TRANSDERMAL | Status: DC
  Administered 2023-07-01 – 2023-07-02 (×2): 21 mg via TRANSDERMAL
  Filled 2023-07-01 (×3): qty 1

## 2023-07-01 NOTE — Progress Notes (Signed)
 Called and s/w wife and she is not able to come at present, she can come tomorrow. She states he is always more confused after dialysis and if he can sleep, he will be better. Pt is up agitated and pulling on lines, dressings. Notified Dr. Gonfa and orders for soft waist belt placed.

## 2023-07-01 NOTE — Progress Notes (Signed)
 Mae Schlossman, MD advised to wait until patient calmed down to initiate IV access/transfuse blood per order as it is not urgent

## 2023-07-01 NOTE — Plan of Care (Signed)
  Problem: Pain Managment: Goal: General experience of comfort will improve and/or be controlled Outcome: Progressing   Problem: Safety: Goal: Ability to remain free from injury will improve Outcome: Progressing

## 2023-07-01 NOTE — Progress Notes (Signed)
 [facility] Progress Note  Name: Ronald [name] MRN: [id number] DOB: 11-07-1940  Chief Complaint:  Bloody stools and weakness  Subjective:  Seen and examined on dialysis.  Blood pressure 134/63 and HR 85.  Tolerating goal.  Procedure supervised. Left AVF in use.  one unmeasured urine void over 5/20.  We are letting him off early per his request.  Per HD RN he is moving his arm     Review of systems:     Denies overt shortness of breath or chest pain Denies n/v Limited due to mild cognitive impairment   ---------- Background on consult:  Reason for Consult: ESRD pt w/ bloody stools, gen'd weakness, missed HD HPI: The patient is a 83 y.o. year-old w/ PMH as below who presented to [facility] ED early this morning complaining of less than 1 day of bloody stool, generalized weakness and missed dialysis.  In ED hemoglobin 6.9, K+ 6.5, WBC 13K, sodium 123, creatinine 12.9.  He was started on transfusion of PRBCs and given some D50 and insulin  for high potassium.  CT of the abdomen showed some findings of mild ischemic colitis possibly.  Case was discussed with GI at [facility] and patient was transferred to [facility].  We are asked to see for dialysis    Intake/Output Summary (Last 24 hours) at 07/01/2023 1038 Last data filed at 07/01/2023 0300 Gross per 24 hour  Intake 403.14 ml  Output --  Net 403.14 ml    Vitals:  Vitals:   07/01/23 0929 07/01/23 0945 07/01/23 1001 07/01/23 1030  BP: 122/62 115/67 (!) 115/47 (!) 134/53  Pulse: 86 83  87  Resp: 19 14 18 18   Temp:      TempSrc:      SpO2: 95% 98%  98%  Weight:         Physical Exam:     General elderly male in bed in no acute distress HEENT normocephalic atraumatic extraocular movements intact sclera anicteric Neck supple trachea midline Lungs clear to auscultation bilaterally normal work of breathing at rest on room air Heart S1S2 no rub Abdomen soft nontender nondistended Extremities 1-2+ edema RLE and trace  to 1+ edema LLE Psych normal mood and affect Neuro - awake and conversant. Oriented to person, recognizes spouse. Not oriented to year. States we are in a hospital but not sure of the name  Access: LUE AVF in use   Medications reviewed   Labs:     Latest Ref Rng & Units 07/01/2023    9:22 AM 06/30/2023   12:24 AM 06/29/2023    7:38 AM  BMP  Glucose 70 - 99 mg/dL 83  91  99   BUN 8 - 23 mg/dL 83  71  61   Creatinine 0.61 - 1.24 mg/dL 78.29  5.62  1.30   Sodium 135 - 145 mmol/L 134  133  132   Potassium 3.5 - 5.1 mmol/L 4.3  4.4  4.1   Chloride 98 - 111 mmol/L 92  90  90   CO2 22 - 32 mmol/L 22  24  24    Calcium  8.9 - 10.3 mg/dL 8.1  8.2  8.0    Outpatient HD orders:  [facility]  3 hours 15 minutes  EDW 91.5 kg  BF 300 ml/min DF 600 ml/min Monday and Friday  3K/2.5 ca Last post weight: 91.3 kg on 5/5 Meds: Mircera every 4 weeks but doesn't look like has received in the past several weeks due to being  hospitalized per [facility] RN; Venofer 50 mg weekly No activated vit D No heparin      Assessment/ Plan: Acute GI bleeding/ rectal bleeding: getting 2u prbc's. H/o recent RP bleed/ hematoma. On DAPT due to hx of CABG and recent AAA repair. GI consulting - appreciate assistance.  ESRD: usually on HD 2x per week, Mon/Friday. Missed 2-3 HD sessions prior to admission and got HD on 5/18 PM.   Recommend HD MWF while here given overload.  He agrees.  He requested to come off treatment early today HTN: acceptable on current regimen  Hyperkalemia - improved with HD. Renal diet  Anemia of esrd: compounded by the GI bleed with acute blood loss.  PRBC's per primary team.  Rec'd aranesp  on 60 mcg on 5/10 prior admit.  Started aranesp  150 mcg weekly on Tuesdays   Secondary hyperparathyroidism: hyperphos. Cont phoslo  with meals and we have increased to three times a week HD while here Recent AAA repair  Mild cognitive impairment - noted per charting  Atrial fib: is on BB; per primary  team Chronic syst HF: last echo EF 40%, G2DD   Disposition - per primary team   Nan Aver, MD 07/01/2023 10:56 AM

## 2023-07-01 NOTE — Progress Notes (Signed)
 PROGRESS NOTE  Ronald [name] [place] ZOX:[id number] DOB: 04/17/1940   PCP: Omie Bickers, MD  Patient is from: Home.  Lives with his wife.  Lately ambulating by holding to furnitures and walls  DOA: 06/27/2023 LOS: 3  Chief complaints Chief Complaint  Patient presents with   Rectal Bleeding     Brief Narrative / Interim history: 83 year old M with PMH of CAD/CABG, HFmrEF, ESRD on HD MF, CVA, HTN, neurocognitive disorder, A-fib not on AC, AAA with retroperitoneal hematoma s/p EVAR on 5/17 by Dr. Fulton Job presented to [facility] ED with rectal bleed.    In ED, stable vitals.  Hgb 6.9 (8.7 on 5/13).  K6.5.  WBC 13k. Na 123.  Cr 13.  BUN 86.  CT angio raise concern for ischemic colitis.  Lactic acid 1.1>> 1.8.  Two units of blood ordered.  Nephrology, GI and vascular surgery consulted, and patient was transferred to [facility] for further evaluation.   On arrival here, vitals remained stable.  Confused and delirious.  K improved to 5.8.  BUN 104.  Bicarb 20.  AG 20.  Na 124.  WBC trended up to 26.8.  Hgb improved to 8.0 to 2 units.   Vascular surgery, GI and nephrology on board.  Hgb seems to level off between 7 and 8 after 3 units of blood.   Subjective: Seen and examined earlier this morning while on HD.  No complaints but not a great historian.  Objective: Vitals:   07/01/23 1001 07/01/23 1030 07/01/23 1100 07/01/23 1102  BP: 132/70 (!) 134/53 120/79 127/67  Pulse: 83 87 81 84  Resp: 17 18 19  (!) 21  Temp:    98.6 F (37 C)  TempSrc:      SpO2: 96% 98% 99% 97%  Weight:    98.6 kg    Examination:  GENERAL: No apparent distress.  Nontoxic. HEENT: MMM.  Vision and hearing grossly intact.  NECK: Supple.  No apparent JVD.  RESP:  No IWOB.  Fair aeration bilaterally. CVS:  RRR. Heart sounds normal.  ABD/GI/GU: BS+. Abd soft, NTND.  MSK/EXT:   No apparent deformity. Moves extremities. BLE edema, right> left.  SKIN: Right groin incision DCI. NEURO: Awake.  Oriented to self.   Follows commands.  No apparent focal neuro deficit. PSYCH: Calm. Normal affect.   Consultants:  Vascular surgery Gastroenterology Nephrology  Procedures: Hemodialysis  Microbiology summarized: Blood cultures NGTD  Assessment and plan: ABLA and GI bleed: Presents with hematemesis and melena.  Was on Plavix  and aspirin  after recent EVAR for AAA.  No extravasation on CT.  Transfused a total of 3 units so far.  Hgb remains low at 7.6 but stable.  No nutritional deficiency on anemia panel. Recent Labs    06/22/23 0404 06/23/23 0901 06/28/23 0102 06/28/23 1351 06/28/23 1857 06/29/23 0738 06/29/23 1321 06/30/23 0024 06/30/23 0930 07/01/23 0922  HGB 8.7* 8.7* 6.9* 8.0* 7.4* 7.1* 7.0* 7.7* 7.6* 7.6*  -GI recs-no EGD, Protonix  twice daily, regular diet and can resume Plavix  on 5/22 if no bleeding -Okay to discontinue aspirin  indefinitely per vascular surgery -Goal Hgb > 8.0 given CAD.  Transfuse 1 unit -Continue monitoring H&H -Aranesp  per nephrology - Continue Parikh antibiotics for a total of 5 days  Leukocytosis: WBC 26>>> 10.5.  HDS.  Nontoxic.  Lactic acid negative.  Some LLQ tenderness on presentation.  Blood cultures NGTD.  CT abdomen and pelvis as above. -Continue IV Zosyn  5/18>>> for total of 5 days empirically for possible intra-abdominal infection  ESRD/azotemia/hyponatremia/hyperkalemia: On HD M and F: Hyperkalemia resolved.  Hyponatremia improved. -Dialysis by nephrology.   Chronic HFmrEF: TTE in 11/2022 with LVEF of 40%, G2-DD and moderate AAS.  Appears euvolemic except for right lower extremity edema.  No respiratory distress. -Fluid management by dialysis -Resume home Toprol -XL at 12.5 mg daily.  Takes 50 mg daily at home  Paroxysmal atrial fibrillation: Rate controlled.  Not on anticoagulation due to risk of bleeding -Continue home amiodarone  -Toprol -XL as above.   History of CAD/CABG/chronic LBBB: No cardiopulmonary symptoms. -Okay to resume Plavix  on 5/22  per GI. -Continue statin -Keep Hgb > 8.0.  AAA with retroperitoneal hematoma s/p EVAR on 5/17 by Dr. Fulton Job.  CT raises concern for ischemic colitis.  Lactic acid 1.1 and 1.8.  LLQ tenderness on exam.  No rebound or guarding. -No intervention from vascular standpoint.  Okay to discontinue aspirin  indefinitely -Antibiotics as above pending cultures. - GI okay to resume Plavix  on 5/22   Severe dementia with delirium:  Awake and alert.  Only oriented to self.  Follows commands.  Seems to be his baseline.  No focal neurodeficit. -Reorientation and delirium precaution -Minimize sedating medications.   -Discontinue IV Dilaudid .  Decreased Ambien . -Fall precaution, abdominal restraints and safety sitter as needed  Prolonged QTc: 585>> 517 but in the setting of wide QRS/LBBB -Minimize QT prolonging drugs.   Lower extremity edema, right> left. -Follow LE venous Doppler.   Dyslipidemia - Continue statin   History of prostate cancer/BPH s/p  seed implantation, follows with Dr. Claretta Croft -Continue Flomax   Physical deconditioning: PT/OT recommended CIR CIR recommended other rehab avenues. - Continue PT/OT  Advance care planning: DNR-Limited.  See IPAL note for details.   Body mass index is 27.91 kg/m.           DVT prophylaxis:  SCDs Start: 06/28/23 0746  Code Status: DNR-Limited Family Communication: None at bedside today. Level of care: Telemetry Medical Status is: Inpatient Remains inpatient appropriate because: Acute blood loss anemia, GI bleed   Final disposition: SNF.   55 minutes with more than 50% spent in reviewing records, counseling patient/family and coordinating care.   Sch Meds:  Scheduled Meds:  sodium chloride    Intravenous Once   sodium chloride    Intravenous Once   acetaminophen   650 mg Oral Q6H WA   amiodarone   200 mg Oral Daily   atorvastatin   80 mg Oral Daily   calcium  acetate  1,334 mg Oral TID WC   Chlorhexidine  Gluconate Cloth  6 each Topical  Q0600   Chlorhexidine  Gluconate Cloth  6 each Topical Q0600   mirtazapine   7.5 mg Oral QHS   pantoprazole  (PROTONIX ) IV  40 mg Intravenous Q12H   tamsulosin   0.4 mg Oral QPC supper   Continuous Infusions:  piperacillin -tazobactam (ZOSYN )  IV 2.25 g (07/01/23 0509)   PRN Meds:.hydrOXYzine , zolpidem   Antimicrobials: Anti-infectives (From admission, onward)    Start     Dose/Rate Route Frequency Ordered Stop   06/28/23 2100  vancomycin  (VANCOCIN ) IVPB 1000 mg/200 mL premix        1,000 mg 200 mL/hr over 60 Minutes Intravenous Once in dialysis 06/28/23 2002 06/29/23 0509   06/28/23 1645  vancomycin  (VANCOREADY) IVPB 2000 mg/400 mL        2,000 mg 200 mL/hr over 120 Minutes Intravenous  Once 06/28/23 1555 06/28/23 1826   06/28/23 1554  vancomycin  variable dose per unstable renal function (pharmacist dosing)  Status:  Discontinued         Does  not apply See admin instructions 06/28/23 1555 06/29/23 0749   06/28/23 1515  piperacillin -tazobactam (ZOSYN ) IVPB 2.25 g        2.25 g 100 mL/hr over 30 Minutes Intravenous Every 8 hours 06/28/23 1415          I have personally reviewed the following labs and images: CBC: Recent Labs  Lab 06/28/23 0102 06/28/23 1351 06/28/23 1857 06/29/23 0738 06/29/23 1321 06/30/23 0024 06/30/23 0930 07/01/23 0922  WBC 13.0* 26.8*  --  19.4*  --   --  12.7* 10.5  HGB 6.9* 8.0*   < > 7.1* 7.0* 7.7* 7.6* 7.6*  HCT 20.5* 23.1*   < > 21.0* 20.3* 23.3* 22.4* 23.2*  MCV 88.4 86.8  --  88.2  --   --  87.2 89.9  PLT 293 243  --  210  --   --  200 209   < > = values in this interval not displayed.   BMP &GFR Recent Labs  Lab 06/28/23 0102 06/28/23 1351 06/29/23 0738 06/30/23 0024 07/01/23 0922  NA 123* 124* 132* 133* 134*  K 6.5* 5.8* 4.1 4.4 4.3  CL 85* 84* 90* 90* 92*  CO2 22 20* 24 24 22   GLUCOSE 137* 113* 99 91 83  BUN 86* 104* 61* 71* 83*  CREATININE 12.99* 13.71* 8.34* 9.62* 11.49*  CALCIUM  7.7* 7.8* 8.0* 8.2* 8.1*  MG  --   --  1.8   --  2.0  PHOS  --  6.7*  --  6.7* 8.2*   Estimated Creatinine Clearance: 5.9 mL/min (A) (by C-G formula based on SCr of 11.49 mg/dL (H)). Liver & Pancreas: Recent Labs  Lab 06/28/23 0102 06/28/23 1351 06/29/23 0738 06/30/23 0024 07/01/23 0922  AST 17  --  12*  --   --   ALT 5  --  <5  --   --   ALKPHOS 90  --  66  --   --   BILITOT 1.0  --  0.9  --   --   PROT 5.4*  --  5.0*  --   --   ALBUMIN  2.4* 2.2* 2.1* 2.1* 2.1*   No results for input(s): "LIPASE", "AMYLASE" in the last 168 hours. No results for input(s): "AMMONIA" in the last 168 hours. Diabetic: No results for input(s): "HGBA1C" in the last 72 hours. No results for input(s): "GLUCAP" in the last 168 hours. Cardiac Enzymes: No results for input(s): "CKTOTAL", "CKMB", "CKMBINDEX", "TROPONINI" in the last 168 hours. No results for input(s): "PROBNP" in the last 8760 hours. Coagulation Profile: No results for input(s): "INR", "PROTIME" in the last 168 hours. Thyroid  Function Tests: No results for input(s): "TSH", "T4TOTAL", "FREET4", "T3FREE", "THYROIDAB" in the last 72 hours. Lipid Profile: No results for input(s): "CHOL", "HDL", "LDLCALC", "TRIG", "CHOLHDL", "LDLDIRECT" in the last 72 hours. Anemia Panel: Recent Labs    06/29/23 0945 06/29/23 1321  VITAMINB12 218  --   FOLATE 10.9  --   FERRITIN  --  733*  TIBC  --  155*  IRON  --  32*  RETICCTPCT 2.8  --    Urine analysis:    Component Value Date/Time   COLORURINE YELLOW 06/10/2023 1007   APPEARANCEUR CLEAR 06/10/2023 1007   APPEARANCEUR Clear 10/29/2022 0906   LABSPEC 1.006 06/10/2023 1007   PHURINE 7.0 06/10/2023 1007   GLUCOSEU NEGATIVE 06/10/2023 1007   HGBUR NEGATIVE 06/10/2023 1007   BILIRUBINUR NEGATIVE 06/10/2023 1007   BILIRUBINUR Negative 10/29/2022 0906   KETONESUR NEGATIVE  06/10/2023 1007   PROTEINUR 100 (A) 06/10/2023 1007   UROBILINOGEN 0.2 05/17/2019 0917   NITRITE NEGATIVE 06/10/2023 1007   LEUKOCYTESUR NEGATIVE 06/10/2023 1007    Sepsis Labs: Invalid input(s): "PROCALCITONIN", "LACTICIDVEN"  Microbiology: Recent Results (from the past 240 hours)  Culture, blood (Routine X 2) w Reflex to ID Panel     Status: None (Preliminary result)   Collection Time: 06/28/23  3:21 PM   Specimen: BLOOD RIGHT ARM  Result Value Ref Range Status   Specimen Description BLOOD RIGHT ARM  Final   Special Requests   Final    BOTTLES DRAWN AEROBIC AND ANAEROBIC Blood Culture results may not be optimal due to an inadequate volume of blood received in culture bottles   Culture   Final    NO GROWTH 3 DAYS Performed at [facility], [address], [place], [place] [address]    Report Status PENDING  Incomplete  Culture, blood (Routine X 2) w Reflex to ID Panel     Status: None (Preliminary result)   Collection Time: 06/28/23  3:23 PM   Specimen: BLOOD RIGHT HAND  Result Value Ref Range Status   Specimen Description BLOOD RIGHT HAND  Final   Special Requests   Final    BOTTLES DRAWN AEROBIC AND ANAEROBIC Blood Culture results may not be optimal due to an inadequate volume of blood received in culture bottles   Culture   Final    NO GROWTH 3 DAYS Performed at [facility], [address] [place], [place] [address]    Report Status PENDING  Incomplete    Radiology Studies: No results found.    Coltyn Hanning T. Tyrique Sporn [facility]  If 7PM-7AM, please contact night-coverage [email or web address] 07/01/2023, 11:19 AM

## 2023-07-01 NOTE — Progress Notes (Signed)
 OT Cancellation Note  Patient Details Name: Ronald [name] MRN: [id number] DOB: January 31, 1941   Cancelled Treatment:    Reason Eval/Treat Not Completed: Patient at procedure or test/ unavailable- HD. Will follow and see as able.   Bary Boss, OT Acute Rehabilitation Services Office [phone] Secure Chat Preferred    Fredrich Jefferson 07/01/2023, 8:25 AM

## 2023-07-01 NOTE — Plan of Care (Signed)
  Problem: Pain Managment: Goal: General experience of comfort will improve and/or be controlled Outcome: Progressing   Problem: Safety: Goal: Ability to remain free from injury will improve Outcome: Progressing   Problem: Skin Integrity: Goal: Risk for impaired skin integrity will decrease Outcome: Progressing

## 2023-07-01 NOTE — Progress Notes (Signed)
    The patient recently underwent physician modified AAA stent graft repair by Dr. Fulton Job on 06/17/2023.  He also required iliac stenting.  He has been admitted for melena and likely GI bleed.  Currently his aspirin  and Plavix  are on hold.  From a vascular standpoint, we are okay with the patient discontinuing Plavix .  Given that he required iliac stenting, we would recommend he continue aspirin  indefinitely once his GI bleeding is resolved.  Deneise Finlay, PA-C [facility]  [phone] 07/01/2023 8:55AM

## 2023-07-01 NOTE — Progress Notes (Signed)
 Attempted venous duplex exam however patient currently combative and being put in restraints.  Will re attempt as patient condition and schedule permit.    07/01/2023 3:27 PM Starlyn Droge RVT, RDMS

## 2023-07-01 NOTE — Progress Notes (Signed)
 Patient ambulated to the bathroom with assistance. Still with bloody mucus on his bowel movement.Tries to removed his IV line. Refused lab draws this morning.

## 2023-07-02 ENCOUNTER — Inpatient Hospital Stay (HOSPITAL_COMMUNITY)

## 2023-07-02 ENCOUNTER — Other Ambulatory Visit (HOSPITAL_COMMUNITY): Payer: Self-pay

## 2023-07-02 DIAGNOSIS — M7989 Other specified soft tissue disorders: Secondary | ICD-10-CM

## 2023-07-02 DIAGNOSIS — I714 Abdominal aortic aneurysm, without rupture, unspecified: Secondary | ICD-10-CM | POA: Diagnosis not present

## 2023-07-02 DIAGNOSIS — K922 Gastrointestinal hemorrhage, unspecified: Secondary | ICD-10-CM | POA: Diagnosis not present

## 2023-07-02 DIAGNOSIS — R4189 Other symptoms and signs involving cognitive functions and awareness: Secondary | ICD-10-CM | POA: Diagnosis not present

## 2023-07-02 DIAGNOSIS — Z9889 Other specified postprocedural states: Secondary | ICD-10-CM | POA: Diagnosis not present

## 2023-07-02 LAB — RENAL FUNCTION PANEL
Albumin: 2.4 g/dL — ABNORMAL LOW (ref 3.5–5.0)
Anion gap: 18 — ABNORMAL HIGH (ref 5–15)
BUN: 57 mg/dL — ABNORMAL HIGH (ref 8–23)
CO2: 24 mmol/L (ref 22–32)
Calcium: 8.3 mg/dL — ABNORMAL LOW (ref 8.9–10.3)
Chloride: 91 mmol/L — ABNORMAL LOW (ref 98–111)
Creatinine, Ser: 9.38 mg/dL — ABNORMAL HIGH (ref 0.61–1.24)
GFR, Estimated: 5 mL/min — ABNORMAL LOW (ref >60)
Glucose, Bld: 112 mg/dL — ABNORMAL HIGH (ref 70–99)
Phosphorus: 6 mg/dL — ABNORMAL HIGH (ref 2.5–4.6)
Potassium: 3.8 mmol/L (ref 3.5–5.1)
Sodium: 133 mmol/L — ABNORMAL LOW (ref 135–145)

## 2023-07-02 LAB — CBC
HCT: 25.5 % — ABNORMAL LOW (ref 39.0–52.0)
Hemoglobin: 8.4 g/dL — ABNORMAL LOW (ref 13.0–17.0)
MCH: 29.7 pg (ref 26.0–34.0)
MCHC: 32.9 g/dL (ref 30.0–36.0)
MCV: 90.1 fL (ref 80.0–100.0)
Platelets: 261 10*3/uL (ref 150–400)
RBC: 2.83 MIL/uL — ABNORMAL LOW (ref 4.22–5.81)
RDW: 17.2 % — ABNORMAL HIGH (ref 11.5–15.5)
WBC: 11.5 10*3/uL — ABNORMAL HIGH (ref 4.0–10.5)
nRBC: 0 % (ref 0.0–0.2)

## 2023-07-02 MED ORDER — AMOXICILLIN-POT CLAVULANATE 500-125 MG PO TABS
1.0000 | ORAL_TABLET | Freq: Every day | ORAL | 0 refills | Status: AC
  Filled 2023-07-02: qty 2, 2d supply, fill #0

## 2023-07-02 MED ORDER — CHLORHEXIDINE GLUCONATE CLOTH 2 % EX PADS: 6.0000 | MEDICATED_PAD | Freq: Every day | CUTANEOUS | Status: DC

## 2023-07-02 MED ORDER — AMIODARONE HCL 200 MG PO TABS
200.0000 mg | ORAL_TABLET | Freq: Every day | ORAL | 0 refills | Status: DC
  Filled 2023-07-02: qty 90, 90d supply, fill #0

## 2023-07-02 MED ORDER — PANTOPRAZOLE SODIUM 40 MG PO TBEC
DELAYED_RELEASE_TABLET | ORAL | 0 refills | Status: DC
  Filled 2023-07-02: qty 150, 90d supply, fill #0

## 2023-07-02 MED ORDER — POLYETHYLENE GLYCOL 3350 17 G PO PACK: 17.0000 g | PACK | Freq: Two times a day (BID) | ORAL | Status: DC | PRN

## 2023-07-02 MED ORDER — CLOPIDOGREL BISULFATE 75 MG PO TABS
75.0000 mg | ORAL_TABLET | Freq: Every day | ORAL | Status: DC
  Administered 2023-07-02: 75 mg via ORAL
  Filled 2023-07-02: qty 1

## 2023-07-02 MED ORDER — METOPROLOL SUCCINATE ER 25 MG PO TB24: 50.0000 mg | ORAL_TABLET | Freq: Every day | ORAL | Status: DC

## 2023-07-02 NOTE — Progress Notes (Signed)
 Bilateral lower extremity venous duplex has been completed. Preliminary results can be found in CV Proc through chart review.   07/02/23 2:07 PM Birda Buffy RVT

## 2023-07-02 NOTE — Evaluation (Signed)
 Occupational Therapy Evaluation Patient Details Name: KHADIR ROAM MRN: [id number] DOB: [birth year] Today's Date: 07/02/2023   History of Present Illness   83 year old M admitted  5/17 with ABLA and GI bleed, Leukocytosis, azotemia/hyponatremia/hyperkalemia. PMH of CAD/CABG, HFmrEF, ESRD on HD MF, CVA, HTN, neurocognitive disorder, A-fib not on AC, AAA with retroperitoneal hematoma s/p EVAR on 5/17, galucoma, macular degeneration, MI, CAD s/p CABG, prostate cancer.     Clinical Impressions Pt admitted based on above, and was seen based on problem list below. PTA pt was receiving min to mod assistance with ADLs and IADLs Today pt is requiring set up  to max +2  for ADLs. Functional transfers are up to max +2  assist depending on surface height. Pt with increased difficulty following commands, unable to sequence steps, poor spatial awareness in hallway. Pt impulsive and easily agitated. Recommendation of <3 hours of skilled rehab daily to optimize independent levels and reduce fall risk. OT will continue to follow acutely to maximize functional independence.        If plan is discharge home, recommend the following:   A lot of help with walking and/or transfers;A lot of help with bathing/dressing/bathroom;Supervision due to cognitive status     Functional Status Assessment   Patient has had a recent decline in their functional status and demonstrates the ability to make significant improvements in function in a reasonable and predictable amount of time.     Equipment Recommendations   BSC/3in1;Tub/shower bench     Recommendations for Other Services         Precautions/Restrictions   Precautions Precautions: Fall Recall of Precautions/Restrictions: Impaired Restrictions Weight Bearing Restrictions Per Provider Order: No     Mobility Bed Mobility Overal bed mobility: Needs Assistance Bed Mobility: Sit to Supine       Sit to supine: Min assist   General  bed mobility comments: Min assist for BLEs    Transfers Overall transfer level: Needs assistance Equipment used: Rolling walker (2 wheels) Transfers: Sit to/from Stand, Bed to chair/wheelchair/BSC Sit to Stand: Max assist, From elevated surface, +2 physical assistance     Step pivot transfers: Min assist     General transfer comment: Pt max to max +2 to come to standing depending on height of seat      Balance Overall balance assessment: Needs assistance Sitting-balance support: No upper extremity supported, Feet supported Sitting balance-Leahy Scale: Fair     Standing balance support: During functional activity, Single extremity supported Standing balance-Leahy Scale: Poor Standing balance comment: Reliant on RW       ADL either performed or assessed with clinical judgement   ADL Overall ADL's : Needs assistance/impaired Eating/Feeding: Set up;Sitting   Grooming: Set up;Sitting   Upper Body Bathing: Set up;Sitting   Lower Body Bathing: Moderate assistance;+2 for physical assistance;Sit to/from stand   Upper Body Dressing : Set up;Sitting   Lower Body Dressing: Maximal assistance;+2 for physical assistance;+2 for safety/equipment;Sit to/from stand   Toilet Transfer: Minimal assistance;Ambulation;Rolling walker (2 wheels);Maximal assistance;Regular Toilet;Grab bars Toilet Transfer Details (indicate cue type and reason): Min assist to steady while walking, max to come to stand with use of GB         Functional mobility during ADLs: Minimal assistance;Maximal assistance       Vision Baseline Vision/History: 1 Wears glasses Vision Assessment?: No apparent visual deficits            Pertinent Vitals/Pain Pain Assessment Pain Assessment: No/denies pain     Extremity/Trunk Assessment Upper  Extremity Assessment Upper Extremity Assessment: Overall WFL for tasks assessed;Difficult to assess due to impaired cognition   Lower Extremity Assessment Lower  Extremity Assessment: Defer to PT evaluation   Cervical / Trunk Assessment Cervical / Trunk Assessment: Kyphotic   Communication Communication Communication: No apparent difficulties   Cognition Arousal: Alert Behavior During Therapy: Impulsive, Agitated, Restless Cognition: Cognition impaired     Awareness: Intellectual awareness impaired, Online awareness impaired Memory impairment (select all impairments): Short-term memory Attention impairment (select first level of impairment): Focused attention Executive functioning impairment (select all impairments): Organization, Sequencing, Reasoning, Problem solving OT - Cognition Comments: Pt with decreased safety awareness, difficulty following commands, unable to sustain attention >5 minutes     Following commands: Impaired Following commands impaired: Follows one step commands inconsistently, Follows one step commands with increased time     Cueing  General Comments   Cueing Techniques: Verbal cues;Tactile cues;Visual cues  Wife and sitter present for session           Home Living Family/patient expects to be discharged to:: Private residence Living Arrangements: Spouse/significant other Available Help at Discharge: Family;Available 24 hours/day Type of Home: House Home Access: Stairs to enter [employer] of Steps: 5 Entrance Stairs-Rails: Left Home Layout: One level     Bathroom Shower/Tub: Producer, television/film/video: Handicapped height     Home Equipment: Agricultural consultant (2 wheels);Cane - single point;Hand held shower head;Grab bars - tub/shower          Prior Functioning/Environment Prior Level of Function : Independent/Modified Independent             Mobility Comments: Just prior to previous admission, pt not using DME, no falls, hold walls when walking. largely sedentary sitting in office chair in kitchen. Wife reports he could walk on his own. Since discharge pt has required  increased assistance to transfer and has become increasinly confused. ADLs Comments: Wife assists with LB dressing    OT Problem List: Decreased strength;Decreased activity tolerance;Decreased range of motion;Impaired balance (sitting and/or standing);Decreased cognition;Decreased safety awareness   OT Treatment/Interventions: Self-care/ADL training;Therapeutic exercise;Energy conservation;DME and/or AE instruction;Therapeutic activities;Patient/family education;Balance training      OT Goals(Current goals can be found in the care plan section)   Acute Rehab OT Goals Patient Stated Goal: To get better OT Goal Formulation: With patient Time For Goal Achievement: 07/16/23 Potential to Achieve Goals: Good   OT Frequency:  Min 2X/week       AM-PAC OT "6 Clicks" Daily Activity     Outcome Measure Help from another person eating meals?: None Help from another person taking care of personal grooming?: A Little Help from another person toileting, which includes using toliet, bedpan, or urinal?: A Lot Help from another person bathing (including washing, rinsing, drying)?: A Lot Help from another person to put on and taking off regular upper body clothing?: A Little Help from another person to put on and taking off regular lower body clothing?: A Lot 6 Click Score: 16   End of Session Equipment Utilized During Treatment: Gait belt;Rolling walker (2 wheels) Nurse Communication: Mobility status  Activity Tolerance: Treatment limited secondary to agitation Patient left: in bed;with call bell/phone within reach;with chair alarm set  OT Visit Diagnosis: Unsteadiness on feet (R26.81);Other abnormalities of gait and mobility (R26.89);Repeated falls (R29.6);Muscle weakness (generalized) (M62.81)                Time: 1610-9604 OT Time Calculation (min): 25 min Charges:  OT General Charges $OT Visit:  1 Visit OT Evaluation $OT Eval Moderate Complexity: 1 Mod  Delmer Ferraris, OT  Acute  Rehabilitation Services Office [phone] Secure chat preferred   Mickael Alamo 07/02/2023, 1:42 PM

## 2023-07-02 NOTE — NC FL2 (Signed)
 [place]  MEDICAID FL2 LEVEL OF CARE FORM     IDENTIFICATION  Patient Name: Ronald [name] Birthdate: 25-Jan-1941 Sex: male Admission Date (Current Location): 06/27/2023  [facility] and [place] Number:  Producer, television/film/video and Address:  [facility], [address], [place], [place] [address]      Provider Number:    Attending Physician Name and Address:  Theadore Finger, MD  Relative Name and Phone Number:       Current Level of Care: Hospital Recommended Level of Care: Skilled Nursing Facility Prior Approval Number:    Date Approved/Denied:   PASRR Number: [id number]  Discharge Plan: SNF    Current Diagnoses: Patient Active Problem List   Diagnosis Date Noted   Advance care planning 06/29/2023   DNR (do not resuscitate) 06/29/2023   Hyperkalemia 06/29/2023   Ischemic colitis (HCC) 06/29/2023   Upper GI bleed 06/28/2023   Status post AAA (abdominal aortic aneurysm) repair 06/22/2023   Acute anemia 06/22/2023   Nonrheumatic aortic valve stenosis 06/22/2023   Chronic heart failure with mildly reduced ejection fraction (HFmrEF, 41-49%) (HCC) 06/22/2023   Atrial fibrillation with controlled ventricular response (HCC) 06/20/2023   Persistent atrial fibrillation (HCC) 06/18/2023   AAA (abdominal aortic aneurysm) (HCC) 06/17/2023   Moderate aortic valve stenosis 04/29/2023   Edema of lower extremity 12/18/2022   Nicotine dependence, cigarettes, uncomplicated 11/27/2022   Acute non-ST segment elevation myocardial infarction (HCC) 11/27/2022   Chronic obstructive pulmonary disease (HCC) 11/27/2022   Paroxysmal A-fib (HCC) 11/12/2022   NSTEMI (non-ST elevated myocardial infarction) (HCC) 11/12/2022   Hx of CABG 11/12/2022   Chronic congestive heart failure (HCC) 11/12/2022   Tobacco abuse 11/12/2022   Skin lesion 04/09/2022   Age-related macular degeneration 04/09/2022   Anemia 04/09/2022   Benign prostatic hyperplasia 04/09/2022    Glaucoma 04/09/2022   Moderate cognitive impairment 07/05/2021   End-stage renal disease on hemodialysis (HCC) 03/14/2021   Secondary hyperparathyroidism (HCC) 03/14/2021   Abdominal aortic aneurysm (HCC) 03/14/2021   Atherosclerosis of coronary artery without angina pectoris 03/14/2021   Chronic insomnia 03/14/2021   Chronic pain syndrome 03/14/2021   Diarrhea 03/14/2021   Gastroesophageal reflux disease without esophagitis 03/14/2021   Mixed hyperlipidemia 03/14/2021   Gross hematuria 04/13/2020   Acute prostatitis 04/13/2020   Essential hypertension 05/04/2019   Chronic kidney disease, stage IV (severe) (HCC) 05/04/2019   Primary malignant neoplasm of prostate (HCC) 11/10/2017    Orientation RESPIRATION BLADDER Height & Weight     Self (fluctuating orientation)  Normal Continent Weight: 217 lb 6 oz (98.6 kg) Height:     BEHAVIORAL SYMPTOMS/MOOD NEUROLOGICAL BOWEL NUTRITION STATUS      Continent    AMBULATORY STATUS COMMUNICATION OF NEEDS Skin   Limited Assist Verbally Normal                       Personal Care Assistance Level of Assistance  Bathing, Feeding, Dressing Bathing Assistance: Limited assistance Feeding assistance: Limited assistance Dressing Assistance: Limited assistance     Functional Limitations Info  Sight, Hearing, Speech Sight Info: Impaired Hearing Info: Adequate Speech Info: Adequate    SPECIAL CARE FACTORS FREQUENCY  PT (By licensed PT), OT (By licensed OT)                    Contractures Contractures Info: Not present    Additional Factors Info  Code Status Code Status Info: DNR  Current Medications (07/02/2023):  This is the current hospital active medication list Current Facility-Administered Medications  Medication Dose Route Frequency Provider Last Rate Last Admin   0.9 %  sodium chloride  infusion (Manually program via Guardrails IV Fluids)   Intravenous Once Mason Sole, Pratik D, DO       0.9 %  sodium chloride   infusion (Manually program via Guardrails IV Fluids)   Intravenous Once Gonfa, Taye T, MD       acetaminophen  (TYLENOL ) tablet 650 mg  650 mg Oral Q6H WA Gonfa, Taye T, MD   650 mg at 07/02/23 0851   amiodarone  (PACERONE ) tablet 200 mg  200 mg Oral Daily Mason Sole, Pratik D, DO   200 mg at 07/02/23 0851   amoxicillin-clavulanate (AUGMENTIN) 500-125 MG per tablet 1 tablet  1 tablet Oral QHS Gonfa, Taye T, MD   1 tablet at 07/01/23 1507   atorvastatin  (LIPITOR) tablet 80 mg  80 mg Oral Daily Shah, Pratik D, DO   80 mg at 07/02/23 0851   calcium  acetate (PHOSLO ) capsule 1,334 mg  1,334 mg Oral TID WC Shah, Pratik D, DO   1,334 mg at 07/02/23 3086   Chlorhexidine  Gluconate Cloth 2 % PADS 6 each  6 each Topical Q0600 Chucky Craver, MD   6 each at 07/01/23 0509   Chlorhexidine  Gluconate Cloth 2 % PADS 6 each  6 each Topical Q0600 Nan Aver, MD   6 each at 07/01/23 0509   clopidogrel  (PLAVIX ) tablet 75 mg  75 mg Oral Daily Gonfa, Taye T, MD   75 mg at 07/02/23 0851   feeding supplement (NEPRO CARB STEADY) liquid 237 mL  237 mL Oral BID BM Gonfa, Taye T, MD   237 mL at 07/02/23 0934   hydrOXYzine  (ATARAX ) tablet 25 mg  25 mg Oral TID PRN Gonfa, Taye T, MD   25 mg at 07/02/23 0851   metoprolol  succinate (TOPROL -XL) 24 hr tablet 12.5 mg  12.5 mg Oral Daily Gonfa, Taye T, MD   12.5 mg at 07/02/23 5784   mirtazapine  (REMERON ) tablet 7.5 mg  7.5 mg Oral QHS Gonfa, Taye T, MD   7.5 mg at 07/01/23 2041   nicotine (NICODERM CQ - dosed in mg/24 hours) patch 21 mg  21 mg Transdermal Daily Gonfa, Taye T, MD   21 mg at 07/02/23 6962   pantoprazole  (PROTONIX ) EC tablet 40 mg  40 mg Oral BID Gonfa, Taye T, MD   40 mg at 07/02/23 9528   tamsulosin  (FLOMAX ) capsule 0.4 mg  0.4 mg Oral QPC supper Shah, Pratik D, DO   0.4 mg at 07/01/23 1637   zolpidem  (AMBIEN ) tablet 5 mg  5 mg Oral QHS PRN Gonfa, Taye T, MD         Discharge Medications: Please see discharge summary for a list of discharge medications.  Relevant  Imaging Results:  Relevant Lab Results:   Additional Information [facility] on Mondays and Fridays with 10:15 am chair time; SS# [id number]  Jeffory Mings, LCSW

## 2023-07-02 NOTE — Care Management Important Message (Signed)
 Important Message  Patient Details  Name: Ronald [name] MRN: [id number] Date of Birth: [birth year]   Important Message Given:  Yes - Medicare IM     Felix Host 07/02/2023, 12:50 PM

## 2023-07-02 NOTE — Progress Notes (Signed)
 Hospitalist made aware and deferred to neurology. Patient's spouse states she wants patient to d/c tomorrow to home following HD. Wife would like to know when patient leaves for HD.

## 2023-07-02 NOTE — Progress Notes (Signed)
 Physical Therapy Treatment Patient Details Name: Ronald [name] MRN: [id number] DOB: 29-May-1940 Today's Date: 07/02/2023   History of Present Illness 83 year old M admitted  5/17 with ABLA and GI bleed, Leukocytosis, azotemia/hyponatremia/hyperkalemia. PMH of CAD/CABG, HFmrEF, ESRD on HD MF, CVA, HTN, neurocognitive disorder, A-fib not on AC, AAA with retroperitoneal hematoma s/p EVAR on 5/17, galucoma, macular degeneration, MI, CAD s/p CABG, prostate cancer.    PT Comments  Pt perseverative on d/c home, wife at bedside states he is still slightly confused vs baseline. Pt demonstrating improved activity tolerance this date, ambulating 2x45 ft gait with use of RW and close guard for safety. Pt struggles most with standing from low surfaces, PT educated pt and family on standing from higher surfaces at home may be easier for the time being given LE weakness. Pt and family want d/c home, recommending HH services at d/c.      If plan is discharge home, recommend the following: A little help with bathing/dressing/bathroom;Assistance with cooking/housework;Direct supervision/assist for medications management;Direct supervision/assist for financial management;Assist for transportation;Help with stairs or ramp for entrance;A little help with walking and/or transfers   Can travel by private vehicle        Equipment Recommendations  None recommended by PT    Recommendations for Other Services       Precautions / Restrictions Precautions Precautions: Fall Recall of Precautions/Restrictions: Impaired Restrictions Weight Bearing Restrictions Per Provider Order: No     Mobility  Bed Mobility Overal bed mobility: Needs Assistance             General bed mobility comments: sitting EOB upon PT arrival, on toilet upon PT exit with wife assisting    Transfers Overall transfer level: Needs assistance Equipment used: Rolling walker (2 wheels) Transfers: Sit to/from Stand, Bed to  chair/wheelchair/BSC Sit to Stand: Min assist, From elevated surface           General transfer comment: assist for power up, rise, steady. Stand x2, from EOB and chair in hallway.    Ambulation/Gait Ambulation/Gait assistance: Contact guard assist Gait Distance (Feet): 45 Feet (x2) Assistive device: Rolling walker (2 wheels) Gait Pattern/deviations: Step-through pattern, Decreased stride length, Trunk flexed, Drifts right/left Gait velocity: decr     General Gait Details: cues for upright posture, placement in RW, hallway navigation   Stairs             Wheelchair Mobility     Tilt Bed    Modified Rankin (Stroke Patients Only)       Balance Overall balance assessment: Needs assistance Sitting-balance support: No upper extremity supported, Feet supported Sitting balance-Leahy Scale: Fair     Standing balance support: During functional activity, Bilateral upper extremity supported, Reliant on assistive device for balance Standing balance-Leahy Scale: Poor Standing balance comment: Reliant on RW                            Communication Communication Communication: No apparent difficulties  Cognition Arousal: Alert Behavior During Therapy: Impulsive, Restless   PT - Cognitive impairments: History of cognitive impairments, Problem solving, Safety/Judgement, Sequencing, Initiation, Attention, Memory, Awareness                       PT - Cognition Comments: pt perseverative on going home Following commands: Impaired Following commands impaired: Follows one step commands inconsistently, Follows one step commands with increased time    Cueing Cueing Techniques: Verbal cues, Tactile cues, Visual  cues  Exercises      General Comments General comments (skin integrity, edema, etc.): Wife and sitter present for session      Pertinent Vitals/Pain Pain Assessment Pain Assessment: Faces Faces Pain Scale: Hurts little more Pain Location:  LEs Pain Descriptors / Indicators: Other (Comment), Tiring (weak) Pain Intervention(s): Limited activity within patient's tolerance, Monitored during session, Repositioned, Other (comment) (seated rest)    Home Living Family/patient expects to be discharged to:: Private residence Living Arrangements: Spouse/significant other Available Help at Discharge: Family;Available 24 hours/day Type of Home: House Home Access: Stairs to enter Entrance Stairs-Rails: Left Entrance Stairs-Number of Steps: 5   Home Layout: One level Home Equipment: Agricultural consultant (2 wheels);Cane - single point;Hand held shower head;Grab bars - tub/shower      Prior Function            PT Goals (current goals can now be found in the care plan section) Acute Rehab PT Goals Patient Stated Goal: return to independence PT Goal Formulation: With patient/family Time For Goal Achievement: 07/19/23 Potential to Achieve Goals: Good Progress towards PT goals: Progressing toward goals    Frequency    Min 2X/week      PT Plan      Co-evaluation              AM-PAC PT "6 Clicks" Mobility   Outcome Measure  Help needed turning from your back to your side while in a flat bed without using bedrails?: A Little Help needed moving from lying on your back to sitting on the side of a flat bed without using bedrails?: A Little Help needed moving to and from a bed to a chair (including a wheelchair)?: A Little Help needed standing up from a chair using your arms (e.g., wheelchair or bedside chair)?: A Little Help needed to walk in hospital room?: A Little Help needed climbing 3-5 steps with a railing? : A Lot 6 Click Score: 17    End of Session Equipment Utilized During Treatment: Gait belt Activity Tolerance: Patient tolerated treatment well;Patient limited by fatigue Patient left: with call bell/phone within reach;in bed;with family/visitor present;Other (comment) (pt on toilet, wife assisting and wife has  been assisting with back to bed throughout the day) Nurse Communication: Mobility status PT Visit Diagnosis: Unsteadiness on feet (R26.81);Other abnormalities of gait and mobility (R26.89);Muscle weakness (generalized) (M62.81);Other symptoms and signs involving the nervous system (R29.898)     Time: 7253-6644 PT Time Calculation (min) (ACUTE ONLY): 25 min  Charges:    $Gait Training: 8-22 mins $Therapeutic Activity: 8-22 mins PT General Charges $$ ACUTE PT VISIT: 1 Visit                     Shirlene Doughty, PT DPT Acute Rehabilitation Services Secure Chat Preferred  Office [phone]    Mykael Trott Cydney Draft 07/02/2023, 4:56 PM

## 2023-07-02 NOTE — Progress Notes (Signed)
 PROGRESS NOTE  Ronald [name] ZOX:[id number] DOB: 1940/02/18   PCP: Omie Bickers, MD  Patient is from: Home.  Lives with his wife.  Lately ambulating by holding to furnitures and walls  DOA: 06/27/2023 LOS: 4  Chief complaints Chief Complaint  Patient presents with   Rectal Bleeding     Brief Narrative / Interim history: 83 year old M with PMH of CAD/CABG, HFmrEF, ESRD on HD MF, CVA, HTN, neurocognitive disorder, A-fib not on AC, AAA with retroperitoneal hematoma s/p EVAR on 5/17 by Dr. Fulton Job presented to [facility] with rectal bleed.    In ED, stable vitals.  Hgb 6.9 (8.7 on 5/13).  K6.5.  WBC 13k. Na 123.  Cr 13.  BUN 86.  CT angio raise concern for ischemic colitis.  Lactic acid 1.1>> 1.8.  Two units of blood ordered.  Nephrology, GI and vascular surgery consulted, and patient was transferred to [facility] for further evaluation.   On arrival here, vitals remained stable.  Confused and delirious.  K improved to 5.8.  BUN 104.  Bicarb 20.  AG 20.  Na 124.  WBC trended up to 26.8.  Hgb improved to 8.0 to 2 units.   Vascular surgery, GI and nephrology on board.  Hgb seems to level off between 7 and 8 after 3 units of blood.  Hospital course complicated by delirium requiring safety sitter, restraints and medications.   Subjective: Seen and examined earlier this morning and this afternoon.  No major events overnight or this morning.  This morning, he was up walking towards the door with a walker with supervision from the sitter.  He states that he is going home.  He is asking for his wife.  He is only oriented to self.  I called and talked to patient's wife and discharge patient with home health and DME.  Patient's wife arrived later and started questioning how he can go home while he has "stomach pain".  Patient is not specifically complaining stomach pain.  He is only oriented to self and family.  We have discussed possibilities such as ischemia as noted on CT, recent AAA repair  with contained leak, possible gastritis or ulcer contributing.  They have already deferred endoscopic evaluation.  Vascular did not feel there is indication for procedure or intervention.  I told her that patient is medically stable for discharge.  Prolonged hospital stay with just worsened his delirium and overmedication.  She is worried about being able to provide the care he needs at home, and likes to wait on SNF placement.  I have also reminded her about difficulty with SNF placement due to ongoing need for sitter.   Objective: Vitals:   07/01/23 1102 07/01/23 2100 07/02/23 0437 07/02/23 0726  BP: 127/67 135/71 128/80 (!) 151/65  Pulse: 84 (!) 47 75 77  Resp: (!) 21 19 17 17   Temp: 98.6 F (37 C) 97.6 F (36.4 C)  97.7 F (36.5 C)  TempSrc:  Oral    SpO2: 97% 95% 99% 99%  Weight: 98.6 kg       Examination:  GENERAL: No apparent distress.  Nontoxic. HEENT: MMM.  Vision and hearing grossly intact.  NECK: Supple.  No apparent JVD.  RESP:  No IWOB.  Fair aeration bilaterally. CVS:  RRR. Heart sounds normal.  ABD/GI/GU: BS+. Abd soft, NTND.  MSK/EXT:   No apparent deformity. Moves extremities. BLE edema, right> left.  SKIN: Right groin incision DCI. NEURO: Awake.  Oriented only to self.  Follows  commands.  No apparent focal neuro deficit. PSYCH: Calm. Normal affect.   Consultants:  Vascular surgery Gastroenterology Nephrology  Procedures: Hemodialysis  Microbiology summarized: Blood cultures NGTD  Assessment and plan: ABLA and GI bleed: Presents with hematemesis and melena.  Was on Plavix  and aspirin  after recent EVAR for AAA.  No extravasation on CT.  Transfused a total of 3 units so far.  Hgb remains low at 7.6 but stable.  No nutritional deficiency on anemia panel. Recent Labs    06/23/23 0901 06/28/23 0102 06/28/23 1351 06/28/23 1857 06/29/23 0738 06/29/23 1321 06/30/23 0024 06/30/23 0930 07/01/23 0922 07/02/23 0623  HGB 8.7* 6.9* 8.0* 7.4* 7.1* 7.0* 7.7*  7.6* 7.6* 8.4*  -GI recs-no EGD, Protonix  twice daily, regular diet and can resume Plavix  on 5/22 if no bleed-resumed -Okay to discontinue aspirin  indefinitely per vascular surgery -Goal Hgb > 8.0 given CAD.   -Monitor CBC daily. -Aranesp  per nephrology -Empiric antibiotics with IV Zosyn  5/18>> Augmentin 5/21  Leukocytosis: WBC 26>>> 10.5.  HDS.  Nontoxic.  Lactic acid negative.  Some LLQ tenderness on presentation.  Blood cultures NGTD.  CT abdomen and pelvis as above. -Continue IV Zosyn  5/18>> p.o. Augmentin 5/21>> for total of 5 days empirically   ESRD/azotemia/hyponatremia/hyperkalemia: On HD M and F -Per nephrology.  Chronic HFmrEF: TTE in 11/2022 with LVEF of 40%, G2-DD and moderate AAS.  Appears euvolemic except for right lower extremity edema.  No respiratory distress. -Fluid management by dialysis -Increase Toprol  to home dose  Paroxysmal atrial fibrillation: Rate controlled.  Not on anticoagulation due to risk of bleeding -Continue home amiodarone  -Toprol -XL as above.   History of CAD/CABG/chronic LBBB: No cardiopulmonary symptoms. -Okay to resume Plavix  on 5/22 per GI.  Resumed. -Continue statin -Keep Hgb > 8.0.  AAA with retroperitoneal hematoma s/p EVAR on 5/17 by Dr. Fulton Job.  CT with unchanged 11.1 cm infrarenal AAA s/p endograft repair, stable contained leak and a stable right inguinal hematoma.  CT also raised concern for ischemic colitis.  Lactic acid 1.1 and 1.8.  Mild LLQ tenderness on exam but no rebound or guarding. -Plavix  resumed on 5/22.  Continue statin. -No intervention from vascular standpoint.  Okay to discontinue aspirin  indefinitely -Empiric antibiotics as above -GI okay to resume Plavix  on 5/22   Severe dementia with delirium:  Awake and alert.  Only oriented to self and family.  Follows commands.  Attempted to get out of his bed. High risk for fall.  See discussion above. -Reorientation and delirium precaution -Minimize sedating medications.   Discontinued opiate and decrease Remeron  and Ambien . -Fall precaution, abdominal restraints and safety sitter as needed  Left renal lesion: CT abdomen and pelvis showed 2.4 cm indeterminate hypodense lesion within lower pole, approximately 5 mm interval increase in size from his recent his CT about a year ago. -Follow-up CT or MRI recommended.  Prolonged QTc: 585>> 517 but in the setting of wide QRS/LBBB -Minimize QT prolonging drugs.   Lower extremity edema, right> left.  Lower extremity venous Doppler negative for DVT.   History of prostate cancer/BPH s/p  seed implantation, follows with Dr. Claretta Croft -Continue Flomax   Physical deconditioning: PT/OT recommended CIR CIR recommended other rehab avenues. - PT/OT recommended SNF.  Dyslipidemia - Continue statin  Advance care planning: DNR-Limited.  See IPAL note for details.   Body mass index is 27.91 kg/m.           DVT prophylaxis:  SCDs Start: 06/28/23 0746  Code Status: DNR-Limited Family Communication: Updated patient's wife at  bedside. Level of care: Telemetry Medical Status is: Inpatient Remains inpatient appropriate because: Acute blood loss anemia, GI bleed and delirium   Final disposition: SNF.   55 minutes with more than 50% spent in reviewing records, counseling patient/family and coordinating care.   Sch Meds:  Scheduled Meds:  sodium chloride    Intravenous Once   sodium chloride    Intravenous Once   acetaminophen   650 mg Oral Q6H WA   amiodarone   200 mg Oral Daily   amoxicillin-clavulanate  1 tablet Oral QHS   atorvastatin   80 mg Oral Daily   calcium  acetate  1,334 mg Oral TID WC   Chlorhexidine  Gluconate Cloth  6 each Topical Q0600   Chlorhexidine  Gluconate Cloth  6 each Topical Q0600   clopidogrel   75 mg Oral Daily   feeding supplement (NEPRO CARB STEADY)  237 mL Oral BID BM   metoprolol  succinate  12.5 mg Oral Daily   mirtazapine   7.5 mg Oral QHS   nicotine  21 mg Transdermal Daily    pantoprazole   40 mg Oral BID   tamsulosin   0.4 mg Oral QPC supper   Continuous Infusions:   PRN Meds:.zolpidem   Antimicrobials: Anti-infectives (From admission, onward)    Start     Dose/Rate Route Frequency Ordered Stop   07/02/23 0000  amoxicillin-clavulanate (AUGMENTIN) 500-125 MG tablet        1 tablet Oral Daily at bedtime 07/02/23 0941 07/04/23 2359   07/01/23 1500  amoxicillin-clavulanate (AUGMENTIN) 500-125 MG per tablet 1 tablet        1 tablet Oral Daily at bedtime 07/01/23 1407 07/04/23 2159   06/28/23 2100  vancomycin  (VANCOCIN ) IVPB 1000 mg/200 mL premix        1,000 mg 200 mL/hr over 60 Minutes Intravenous Once in dialysis 06/28/23 2002 06/29/23 0509   06/28/23 1645  vancomycin  (VANCOREADY) IVPB 2000 mg/400 mL        2,000 mg 200 mL/hr over 120 Minutes Intravenous  Once 06/28/23 1555 06/28/23 1826   06/28/23 1554  vancomycin  variable dose per unstable renal function (pharmacist dosing)  Status:  Discontinued         Does not apply See admin instructions 06/28/23 1555 06/29/23 0749   06/28/23 1515  piperacillin -tazobactam (ZOSYN ) IVPB 2.25 g  Status:  Discontinued        2.25 g 100 mL/hr over 30 Minutes Intravenous Every 8 hours 06/28/23 1415 07/01/23 1407        I have personally reviewed the following labs and images: CBC: Recent Labs  Lab 06/28/23 1351 06/28/23 1857 06/29/23 0738 06/29/23 1321 06/30/23 0024 06/30/23 0930 07/01/23 0922 07/02/23 0623  WBC 26.8*  --  19.4*  --   --  12.7* 10.5 11.5*  HGB 8.0*   < > 7.1* 7.0* 7.7* 7.6* 7.6* 8.4*  HCT 23.1*   < > 21.0* 20.3* 23.3* 22.4* 23.2* 25.5*  MCV 86.8  --  88.2  --   --  87.2 89.9 90.1  PLT 243  --  210  --   --  200 209 261   < > = values in this interval not displayed.   BMP &GFR Recent Labs  Lab 06/28/23 1351 06/29/23 0738 06/30/23 0024 07/01/23 0922 07/02/23 0623  NA 124* 132* 133* 134* 133*  K 5.8* 4.1 4.4 4.3 3.8  CL 84* 90* 90* 92* 91*  CO2 20* 24 24 22 24   GLUCOSE 113* 99 91 83  112*  BUN 104* 61* 71* 83* 57*  CREATININE 13.71*  8.34* 9.62* 11.49* 9.38*  CALCIUM  7.8* 8.0* 8.2* 8.1* 8.3*  MG  --  1.8  --  2.0  --   PHOS 6.7*  --  6.7* 8.2* 6.0*   Estimated Creatinine Clearance: 7.2 mL/min (A) (by C-G formula based on SCr of 9.38 mg/dL (H)). Liver & Pancreas: Recent Labs  Lab 06/28/23 0102 06/28/23 1351 06/29/23 0738 06/30/23 0024 07/01/23 0922 07/02/23 0623  AST 17  --  12*  --   --   --   ALT 5  --  <5  --   --   --   ALKPHOS 90  --  66  --   --   --   BILITOT 1.0  --  0.9  --   --   --   PROT 5.4*  --  5.0*  --   --   --   ALBUMIN  2.4* 2.2* 2.1* 2.1* 2.1* 2.4*   No results for input(s): "LIPASE", "AMYLASE" in the last 168 hours. No results for input(s): "AMMONIA" in the last 168 hours. Diabetic: No results for input(s): "HGBA1C" in the last 72 hours. No results for input(s): "GLUCAP" in the last 168 hours. Cardiac Enzymes: No results for input(s): "CKTOTAL", "CKMB", "CKMBINDEX", "TROPONINI" in the last 168 hours. No results for input(s): "PROBNP" in the last 8760 hours. Coagulation Profile: No results for input(s): "INR", "PROTIME" in the last 168 hours. Thyroid  Function Tests: No results for input(s): "TSH", "T4TOTAL", "FREET4", "T3FREE", "THYROIDAB" in the last 72 hours. Lipid Profile: No results for input(s): "CHOL", "HDL", "LDLCALC", "TRIG", "CHOLHDL", "LDLDIRECT" in the last 72 hours. Anemia Panel: No results for input(s): "VITAMINB12", "FOLATE", "FERRITIN", "TIBC", "IRON", "RETICCTPCT" in the last 72 hours.  Urine analysis:    Component Value Date/Time   COLORURINE YELLOW 06/10/2023 1007   APPEARANCEUR CLEAR 06/10/2023 1007   APPEARANCEUR Clear 10/29/2022 0906   LABSPEC 1.006 06/10/2023 1007   PHURINE 7.0 06/10/2023 1007   GLUCOSEU NEGATIVE 06/10/2023 1007   HGBUR NEGATIVE 06/10/2023 1007   BILIRUBINUR NEGATIVE 06/10/2023 1007   BILIRUBINUR Negative 10/29/2022 0906   KETONESUR NEGATIVE 06/10/2023 1007   PROTEINUR 100 (A) 06/10/2023  1007   UROBILINOGEN 0.2 05/17/2019 0917   NITRITE NEGATIVE 06/10/2023 1007   LEUKOCYTESUR NEGATIVE 06/10/2023 1007   Sepsis Labs: Invalid input(s): "PROCALCITONIN", "LACTICIDVEN"  Microbiology: Recent Results (from the past 240 hours)  Culture, blood (Routine X 2) w Reflex to ID Panel     Status: None (Preliminary result)   Collection Time: 06/28/23  3:21 PM   Specimen: BLOOD RIGHT ARM  Result Value Ref Range Status   Specimen Description BLOOD RIGHT ARM  Final   Special Requests   Final    BOTTLES DRAWN AEROBIC AND ANAEROBIC Blood Culture results may not be optimal due to an inadequate volume of blood received in culture bottles   Culture   Final    NO GROWTH 4 DAYS Performed at [facility], [address] [place], [place] [address]    Report Status PENDING  Incomplete  Culture, blood (Routine X 2) w Reflex to ID Panel     Status: None (Preliminary result)   Collection Time: 06/28/23  3:23 PM   Specimen: BLOOD RIGHT HAND  Result Value Ref Range Status   Specimen Description BLOOD RIGHT HAND  Final   Special Requests   Final    BOTTLES DRAWN AEROBIC AND ANAEROBIC Blood Culture results may not be optimal due to an inadequate volume of blood received in culture bottles  Culture   Final    NO GROWTH 4 DAYS Performed at [facility], [address], [place], [place] [address]    Report Status PENDING  Incomplete    Radiology Studies: VAS US  LOWER EXTREMITY VENOUS (DVT) Result Date: 07/02/2023  Lower Venous DVT Study Patient Name:  Ronald [name]  Date of Exam:   07/02/2023 Medical Rec #: [id number]             Accession #:    [id number] Date of Birth: 07/24/1940             Patient Gender: M Patient Age:   53 years Exam Location:  [facility] Procedure:      VAS US  LOWER EXTREMITY VENOUS (DVT) Referring Phys: Lorella Roles Ioana Louks --------------------------------------------------------------------------------  Indications: Swelling.  Risk Factors: None identified.  Comparison Study: No prior studies. Performing Technologist: Lerry Ransom RVT  Examination Guidelines: A complete evaluation includes B-mode imaging, spectral Doppler, color Doppler, and power Doppler as needed of all accessible portions of each vessel. Bilateral testing is considered an integral part of a complete examination. Limited examinations for reoccurring indications may be performed as noted. The reflux portion of the exam is performed with the patient in reverse Trendelenburg.  +---------+---------------+---------+-----------+----------+--------------+ RIGHT    CompressibilityPhasicitySpontaneityPropertiesThrombus Aging +---------+---------------+---------+-----------+----------+--------------+ CFV      Full           Yes      Yes                                 +---------+---------------+---------+-----------+----------+--------------+ SFJ      Full                                                        +---------+---------------+---------+-----------+----------+--------------+ FV Prox  Full                                                        +---------+---------------+---------+-----------+----------+--------------+ FV Mid   Full                                                        +---------+---------------+---------+-----------+----------+--------------+ FV DistalFull                                                        +---------+---------------+---------+-----------+----------+--------------+ PFV      Full                                                        +---------+---------------+---------+-----------+----------+--------------+ POP      Full  Yes      Yes                                 +---------+---------------+---------+-----------+----------+--------------+ PTV      Full                                                        +---------+---------------+---------+-----------+----------+--------------+ PERO      Full                                                        +---------+---------------+---------+-----------+----------+--------------+   +---------+---------------+---------+-----------+----------+--------------+ LEFT     CompressibilityPhasicitySpontaneityPropertiesThrombus Aging +---------+---------------+---------+-----------+----------+--------------+ CFV      Full           Yes      Yes                                 +---------+---------------+---------+-----------+----------+--------------+ SFJ      Full                                                        +---------+---------------+---------+-----------+----------+--------------+ FV Prox  Full                                                        +---------+---------------+---------+-----------+----------+--------------+ FV Mid   Full                                                        +---------+---------------+---------+-----------+----------+--------------+ FV DistalFull                                                        +---------+---------------+---------+-----------+----------+--------------+ PFV      Full                                                        +---------+---------------+---------+-----------+----------+--------------+ POP      Full           Yes      Yes                                 +---------+---------------+---------+-----------+----------+--------------+ PTV  Full                                                        +---------+---------------+---------+-----------+----------+--------------+ PERO     Full                                                        +---------+---------------+---------+-----------+----------+--------------+     Summary: RIGHT: - There is no evidence of deep vein thrombosis in the lower extremity.  - No cystic structure found in the popliteal fossa.  LEFT: - There is no evidence of deep vein thrombosis in the  lower extremity.  - No cystic structure found in the popliteal fossa.  *See table(s) above for measurements and observations.    Preliminary       Jessieca Rhem T. [facility]  If 7PM-7AM, please contact night-coverage [email or web address] 07/02/2023, 3:33 PM

## 2023-07-02 NOTE — Progress Notes (Signed)
   07/02/23 1031  TOC Brief Assessment  Insurance and Status Reviewed  Patient has primary care physician Yes  Home environment has been reviewed home with wife  Prior level of function: has DME  Prior/Current Home Services Current home services  Social Drivers of Health Review SDOH reviewed no interventions necessary  Readmission risk has been reviewed Yes   Patient from home with wife. Active with [facility]. NCM spoke to wife via phone . She does want to continue services with [facility]. Orders given to Artavia with [facility]. [facility] will call wife to set up a HH visit

## 2023-07-02 NOTE — Progress Notes (Signed)
 Patient still confused  and agitated and wants to go home.Still with black tarry stool,

## 2023-07-02 NOTE — TOC Progression Note (Signed)
 Transition of Care [facility]) - Progression Note    Patient Details  Name: Ronald [name] MRN: [id number] Date of Birth: 1940-11-20  Transition of Care [facility]) CM/SW Contact  Paullette Boston [place], [place] Phone Number: 07/02/2023, 11:48 AM  Clinical Narrative:   Spoke to pt's wife re PT/OT recommendation for rehab. CIR declined as they do not believe [facility] will authorize AIR. Discussed SNF vs HH and wife is agreeable to SW starting SNF search. Will f/u with SNF bed offers to wife and confirm dc plan. Pt currently has a sitter in place and will need to be free from physical and chemical restraints for 24 hours prior to dc to SNF. MD made aware pt's wife requesting medical update.   Paullette Boston, MSW, LCSW [phone] (coverage)           Expected Discharge Plan and Services         Expected Discharge Date: 07/02/23                                     Social Determinants of Health (SDOH) Interventions SDOH Screenings   Food Insecurity: Patient Unable To Answer (06/29/2023)  Housing: Patient Unable To Answer (06/29/2023)  Transportation Needs: Patient Unable To Answer (06/29/2023)  Utilities: Patient Unable To Answer (06/29/2023)  Depression (PHQ2-9): Medium Risk (05/22/2023)  Financial Resource Strain: Low Risk  (01/07/2023)  Physical Activity: Unknown (11/10/2017)   Received from [facility], [facility]  Social Connections: Unknown (06/29/2023)  Stress: No Stress Concern Present (01/07/2023)  Tobacco Use: Medium Risk (06/28/2023)  Health Literacy: Adequate Health Literacy (01/07/2023)    Readmission Risk Interventions    07/02/2023   10:31 AM 06/23/2023    3:01 PM 11/14/2022    7:26 AM  Readmission Risk Prevention Plan  Transportation Screening Complete Complete Complete  HRI or Home Care Consult  Complete Complete  Social Work Consult for Recovery Care Planning/Counseling  Complete Complete  Palliative Care Screening  Not Applicable Not Applicable   Medication Review Oceanographer) Complete Complete Complete  PCP or Specialist appointment within 3-5 days of discharge Complete    HRI or Home Care Consult Complete

## 2023-07-02 NOTE — Progress Notes (Signed)
 [facility] Progress Note  Name: Ronald [name] MRN: [id number] DOB: 1940-05-20  Chief Complaint:  Bloody stools and weakness  Subjective:  He underwent extra HD on 5/21 with 1 kg UF.  He requested to come off early.  His wife is concerned about his swelling.  Spoke with her in the room.  We discussed that he had requested to come off early and we couldn't quite remove what we were hoping to.      Review of systems:      Denies overt shortness of breath or chest pain Denies n/v Limited due to mild cognitive impairment   ---------- Background on consult:  Reason for Consult: ESRD pt w/ bloody stools, gen'd weakness, missed HD HPI: The patient is a 83 y.o. year-old w/ PMH as below who presented to [facility] ED early this morning complaining of less than 1 day of bloody stool, generalized weakness and missed dialysis.  In ED hemoglobin 6.9, K+ 6.5, WBC 13K, sodium 123, creatinine 12.9.  He was started on transfusion of PRBCs and given some D50 and insulin  for high potassium.  CT of the abdomen showed some findings of mild ischemic colitis possibly.  Case was discussed with GI at [facility] and patient was transferred to [facility].  We are asked to see for dialysis    Intake/Output Summary (Last 24 hours) at 07/02/2023 1232 Last data filed at 07/02/2023 1224 Gross per 24 hour  Intake 357 ml  Output --  Net 357 ml    Vitals:  Vitals:   07/01/23 1102 07/01/23 2100 07/02/23 0437 07/02/23 0726  BP: 127/67 135/71 128/80 (!) 151/65  Pulse: 84 (!) 47 75 77  Resp: (!) 21 19 17 17   Temp: 98.6 F (37 C) 97.6 F (36.4 C)  97.7 F (36.5 C)  TempSrc:  Oral    SpO2: 97% 95% 99% 99%  Weight: 98.6 kg        Physical Exam:    General elderly male in bed in no acute distress HEENT normocephalic atraumatic extraocular movements intact sclera anicteric Neck supple trachea midline Lungs clear to auscultation bilaterally normal work of breathing at rest on room air Heart S1S2  no rub Abdomen soft nontender nondistended Extremities 1-2+ edema RLE > LLE Psych normal mood and affect Neuro - awake and conversant. Oriented to person, recognizes spouse.  Follows commands Access: LUE AVF with bruit and thrill    Medications reviewed   Labs:     Latest Ref Rng & Units 07/02/2023    6:23 AM 07/01/2023    9:22 AM 06/30/2023   12:24 AM  BMP  Glucose 70 - 99 mg/dL 409  83  91   BUN 8 - 23 mg/dL 57  83  71   Creatinine 0.61 - 1.24 mg/dL 8.11  91.47  8.29   Sodium 135 - 145 mmol/L 133  134  133   Potassium 3.5 - 5.1 mmol/L 3.8  4.3  4.4   Chloride 98 - 111 mmol/L 91  92  90   CO2 22 - 32 mmol/L 24  22  24    Calcium  8.9 - 10.3 mg/dL 8.3  8.1  8.2    Outpatient HD orders:  [facility]  3 hours 15 minutes  EDW 91.5 kg  BF 300 ml/min DF 600 ml/min Monday and Friday  3K/2.5 ca Last post weight: 91.3 kg on 5/5 Meds: Mircera every 4 weeks but doesn't look like has received in the past several weeks due  to being hospitalized per [facility] RN; Venofer 50 mg weekly No activated vit D No heparin      Assessment/ Plan: Acute GI bleeding/ rectal bleeding: getting 2u prbc's. H/o recent RP bleed/ hematoma. On DAPT due to hx of CABG and recent AAA repair. GI consulting - appreciate assistance.  ESRD: usually on HD 2x per week, Mon/Friday. Missed 2-3 HD sessions prior to admission and got HD on 5/18 PM.   Recommend HD MWF while here given overload.  He agrees. HTN: acceptable on current regimen  Hyperkalemia - improved with HD and renal diet  Anemia of esrd: compounded by the GI bleed with acute blood loss.  PRBC's per primary team.  Rec'd aranesp  on 60 mcg on 5/10 prior admit.  Started aranesp  150 mcg weekly on Tuesdays   Secondary hyperparathyroidism: hyperphos. Cont phoslo  with meals and we have increased to three times a week HD while here Recent AAA repair  Mild cognitive impairment - noted per charting  Atrial fib: is on BB; per primary team Chronic syst HF:  last echo EF 40%, G2DD   Disposition - per primary team   Nan Aver, MD 07/02/2023 12:42 PM

## 2023-07-03 ENCOUNTER — Encounter: Payer: Self-pay | Admitting: *Deleted

## 2023-07-03 ENCOUNTER — Telehealth: Payer: Self-pay | Admitting: *Deleted

## 2023-07-03 ENCOUNTER — Other Ambulatory Visit (HOSPITAL_COMMUNITY): Payer: Self-pay

## 2023-07-03 DIAGNOSIS — K922 Gastrointestinal hemorrhage, unspecified: Secondary | ICD-10-CM | POA: Diagnosis not present

## 2023-07-03 LAB — TYPE AND SCREEN
ABO/RH(D): A POS
Antibody Screen: POSITIVE
Unit division: 0
Unit division: 0

## 2023-07-03 LAB — CBC
HCT: 23.9 % — ABNORMAL LOW (ref 39.0–52.0)
Hemoglobin: 7.8 g/dL — ABNORMAL LOW (ref 13.0–17.0)
MCH: 29.4 pg (ref 26.0–34.0)
MCHC: 32.6 g/dL (ref 30.0–36.0)
MCV: 90.2 fL (ref 80.0–100.0)
Platelets: 252 10*3/uL (ref 150–400)
RBC: 2.65 MIL/uL — ABNORMAL LOW (ref 4.22–5.81)
RDW: 16.9 % — ABNORMAL HIGH (ref 11.5–15.5)
WBC: 9.8 10*3/uL (ref 4.0–10.5)
nRBC: 0.2 % (ref 0.0–0.2)

## 2023-07-03 LAB — BPAM RBC
Blood Product Expiration Date: 202506182359
Blood Product Expiration Date: 202506182359
ISSUE DATE / TIME: 202505191834
Unit Type and Rh: 6200
Unit Type and Rh: 6200

## 2023-07-03 LAB — RENAL FUNCTION PANEL
Albumin: 2.1 g/dL — ABNORMAL LOW (ref 3.5–5.0)
Anion gap: 14 (ref 5–15)
BUN: 60 mg/dL — ABNORMAL HIGH (ref 8–23)
CO2: 25 mmol/L (ref 22–32)
Calcium: 7.9 mg/dL — ABNORMAL LOW (ref 8.9–10.3)
Chloride: 95 mmol/L — ABNORMAL LOW (ref 98–111)
Creatinine, Ser: 10.66 mg/dL — ABNORMAL HIGH (ref 0.61–1.24)
GFR, Estimated: 4 mL/min — ABNORMAL LOW (ref >60)
Glucose, Bld: 90 mg/dL (ref 70–99)
Phosphorus: 6.7 mg/dL — ABNORMAL HIGH (ref 2.5–4.6)
Potassium: 3.8 mmol/L (ref 3.5–5.1)
Sodium: 134 mmol/L — ABNORMAL LOW (ref 135–145)

## 2023-07-03 LAB — CULTURE, BLOOD (ROUTINE X 2)
Culture: NO GROWTH
Culture: NO GROWTH

## 2023-07-03 LAB — MAGNESIUM: Magnesium: 2.1 mg/dL (ref 1.7–2.4)

## 2023-07-03 MED ORDER — POLYETHYLENE GLYCOL 3350 17 GM/SCOOP PO POWD
17.0000 g | Freq: Two times a day (BID) | ORAL | 0 refills | Status: DC | PRN
  Filled 2023-07-03: qty 238, 7d supply, fill #0

## 2023-07-03 NOTE — Discharge Summary (Signed)
 Physician Discharge Summary  Patient ID: Ronald [name] MRN: [id number] DOB/AGE: [birth year] 83 y.o.  Admit date: 06/27/2023 Discharge date: 07/03/2023  Admission Diagnoses:  Discharge Diagnoses:  Principal Problem:   Upper GI bleed Active Problems:   Essential hypertension   Moderate cognitive impairment   End-stage renal disease on hemodialysis (HCC)   Atherosclerosis of coronary artery without angina pectoris   Benign prostatic hyperplasia   Mixed hyperlipidemia   AAA (abdominal aortic aneurysm) (HCC)   Atrial fibrillation with controlled ventricular response (HCC)   Status post AAA (abdominal aortic aneurysm) repair   Acute anemia   Advance care planning   DNR (do not resuscitate)   Hyperkalemia   Ischemic colitis (HCC)   Discharged Condition: {condition:18240}  Hospital Course: ***  Consults: {consultation:18241}  Significant Diagnostic Studies: {diagnostics:18242}  Treatments: {Tx:18249}  Discharge Exam: Blood pressure 130/66, pulse 65, temperature 97.8 F (36.6 C), resp. rate 19, height 6\' 2"  (1.88 m), weight 98.6 kg, SpO2 100%. {physical [id number]  Disposition: Discharge disposition: 06-Home-Health Care Svc       Discharge Instructions     Diet - low sodium heart healthy   Complete by: As directed    Discharge instructions   Complete by: As directed    It has been a pleasure taking care of you!  You were hospitalized due to gastrointestinal bleeding for which you have been treated with blood transfusion and medications.  Your bleeding seems to have stopped.  We have stopped your aspirin .  Please review your new medication list and the directions on your medications before you take them.  Follow-up with your primary care doctor in 1 to 2 weeks.  Follow-up with vascular surgery at the recommendation.  Continue your dialysis.  Avoid any over-the-counter pain medications than plain Tylenol .  Note that combination of tramadol , mirtazapine  and  zolpidem  can increase your risk of drowsiness, sedation, constipation, impaired judgment, respiratory depression that could potentially lead to death and impaired balance that could increase risk of fall. We strongly recommend talking to your doctors who prescribe them. We do not recommend driving, operating machinery or other activity that requires similar mental and physical engagement.      Take care,   Increase activity slowly   Complete by: As directed    Increase activity slowly   Complete by: As directed       Allergies as of 07/03/2023   No Known Allergies      Medication List     STOP taking these medications    amLODipine  5 MG tablet Commonly known as: NORVASC    aspirin  EC 81 MG tablet   carvedilol  25 MG tablet Commonly known as: COREG    traMADol  50 MG tablet Commonly known as: ULTRAM        TAKE these medications    amiodarone  200 MG tablet Commonly known as: PACERONE  Take 1 tablet (200 mg total) by mouth daily. What changed: See the new instructions.   amoxicillin-clavulanate 500-125 MG tablet Commonly known as: AUGMENTIN Take 1 tablet by mouth at bedtime for 2 days.   atorvastatin  80 MG tablet Commonly known as: LIPITOR Take 80 mg by mouth in the morning.   calcium  acetate 667 MG capsule Commonly known as: PHOSLO  Take 1,334 mg by mouth 3 (three) times daily. citracal   clopidogrel  75 MG tablet Commonly known as: PLAVIX  Take 1 tablet (75 mg total) by mouth daily.   metoprolol  succinate 50 MG 24 hr tablet Commonly known as: Toprol  XL Take 1 tablet (50 mg total)  by mouth daily. Take with or immediately following a meal. What changed: Another medication with the same name was removed. Continue taking this medication, and follow the directions you see here.   mirtazapine  15 MG tablet Commonly known as: REMERON  Take 15 mg by mouth at bedtime.   pantoprazole  40 MG tablet Commonly known as: PROTONIX  Take 1 tablet (40 mg total) by mouth 2 (two)  times daily for 60 days, THEN 1 tablet (40 mg total) daily. Start taking on: Jul 02, 2023 What changed: See the new instructions.   polyethylene glycol 17 g packet Commonly known as: MIRALAX  / GLYCOLAX  Take 17 g by mouth 2 (two) times daily as needed for mild constipation.   tamsulosin  0.4 MG Caps capsule Commonly known as: FLOMAX  Take 1 capsule (0.4 mg total) by mouth daily after supper.   zolpidem  10 MG tablet Commonly known as: AMBIEN  Take 10 mg by mouth at bedtime as needed for sleep.        Follow-up Information     Omie Bickers, MD. Schedule an appointment as soon as possible for a visit in 1 week(s).   Specialty: Internal Medicine Contact information: [address] [place] [address] [phone]         Annamae Barrett, [facility]  Follow up.   Contact information: [address] [place] [address] [phone]                 Signed: Doroteo Gasmen 07/03/2023, 9:17 AM

## 2023-07-03 NOTE — Progress Notes (Signed)
 [facility] sent epic chat. Patient is supposed to discharge today after completing dialysis. They are taking him for dialysis now. Spouse has been called per her request and her arrival is anticipated for 11am. She expects him to discharge right after dialysis and has made arrangements for transportation.

## 2023-07-03 NOTE — TOC Progression Note (Signed)
 Transition of Care [facility]) - Progression Note    Patient Details  Name: Ronald [name] MRN: [id number] Date of Birth: 1940/04/22  Transition of Care [facility]) CM/SW Contact  Paullette Boston [place], [place] Phone Number: 07/03/2023, 9:29 AM  Clinical Narrative: PT updated rec to [facility]. CM following for [facility] needs. SW signing off at this time.   Paullette Boston, MSW, LCSW [phone] (coverage)             Expected Discharge Plan and Services         Expected Discharge Date: 07/03/23                                     Social Determinants of Health (SDOH) Interventions SDOH Screenings   Food Insecurity: Patient Unable To Answer (06/29/2023)  Housing: Patient Unable To Answer (06/29/2023)  Transportation Needs: Patient Unable To Answer (06/29/2023)  Utilities: Patient Unable To Answer (06/29/2023)  Depression (PHQ2-9): Medium Risk (05/22/2023)  Financial Resource Strain: Low Risk  (01/07/2023)  Physical Activity: Unknown (11/10/2017)   Received from [facility]  Social Connections: Unknown (06/29/2023)  Stress: No Stress Concern Present (01/07/2023)  Tobacco Use: Medium Risk (06/28/2023)  Health Literacy: Adequate Health Literacy (01/07/2023)    Readmission Risk Interventions    07/02/2023   10:31 AM 06/23/2023    3:01 PM 11/14/2022    7:26 AM  Readmission Risk Prevention Plan  Transportation Screening Complete Complete Complete  HRI or Home Care Consult  Complete Complete  Social Work Consult for Recovery Care Planning/Counseling  Complete Complete  Palliative Care Screening  Not Applicable Not Applicable  Medication Review Oceanographer) Complete Complete Complete  PCP or Specialist appointment within 3-5 days of discharge Complete    HRI or Home Care Consult Complete

## 2023-07-03 NOTE — Progress Notes (Signed)
   07/03/23 1230  Vitals  Temp 97.6 F (36.4 C)  Pulse Rate 82  Resp 19  BP 136/61  SpO2 100 %  O2 Device Room Air  Weight 97 kg  Type of Weight Post-Dialysis  Oxygen Therapy  Patient Activity (if Appropriate) In bed  Pulse Oximetry Type Continuous  Oximetry Probe Site Changed No  Post Treatment  Dialyzer Clearance Clear  Hemodialysis Intake (mL) 0 mL  Liters Processed 84  Fluid Removed (mL) 3000 mL  Tolerated HD Treatment Yes  AVG/AVF Arterial Site Held (minutes) 8 minutes  AVG/AVF Venous Site Held (minutes) 8 minutes   Received patient in bed to unit.  Alert and oriented.  Informed consent signed and in chart.   TX duration:3.5HRS  Patient tolerated well.  Transported back to the room  Alert, without acute distress.  Hand-off given to patient's nurse.   Access used: LAVF Access issues: NONE  Total UF removed: 3HRS Medication(s) given: NONE    [facility]

## 2023-07-03 NOTE — Progress Notes (Signed)
 Gave report to the HD nurse.

## 2023-07-03 NOTE — TOC Progression Note (Signed)
 Transition of Care (TOC) - Progression Note   [facility] is still able to provide home health services and aware discharge is today  Patient Details  Name: Ronald [name] MRN: [id number] Date of Birth: 15-Jul-1940  Transition of Care [facility]) CM/SW Contact  Mirl Hillery, Arturo Late, RN Phone Number: 07/03/2023, 12:17 PM  Clinical Narrative:            Expected Discharge Plan and Services         Expected Discharge Date: 07/03/23                                     Social Determinants of Health (SDOH) Interventions SDOH Screenings   Food Insecurity: Patient Unable To Answer (06/29/2023)  Housing: Patient Unable To Answer (06/29/2023)  Transportation Needs: Patient Unable To Answer (06/29/2023)  Utilities: Patient Unable To Answer (06/29/2023)  Depression (PHQ2-9): Medium Risk (05/22/2023)  Financial Resource Strain: Low Risk  (01/07/2023)  Physical Activity: Unknown (11/10/2017)   Received from [facility]  Social Connections: Unknown (06/29/2023)  Stress: No Stress Concern Present (01/07/2023)  Tobacco Use: Medium Risk (06/28/2023)  Health Literacy: Adequate Health Literacy (01/07/2023)    Readmission Risk Interventions    07/02/2023   10:31 AM 06/23/2023    3:01 PM 11/14/2022    7:26 AM  Readmission Risk Prevention Plan  Transportation Screening Complete Complete Complete  HRI or Home Care Consult  Complete Complete  Social Work Consult for Recovery Care Planning/Counseling  Complete Complete  Palliative Care Screening  Not Applicable Not Applicable  Medication Review Oceanographer) Complete Complete Complete  PCP or Specialist appointment within 3-5 days of discharge Complete    HRI or Home Care Consult Complete

## 2023-07-03 NOTE — Progress Notes (Addendum)
 D/C order noted. Contacted [facility] to advise staff of pt's d/c to home today and that pt should resume care on Monday. Will fax d/c summary and today's renal note once available.   Lauraine Polite Renal Navigator [phone]  Addendum at 3:16 pm: Renal note for today faxed to clinic. D/C summary has not been completed as of yet.

## 2023-07-03 NOTE — Plan of Care (Signed)

## 2023-07-03 NOTE — Progress Notes (Signed)
 [facility] Progress Note  Name: Ronald [name] MRN: [id number] DOB: 20-Oct-1940  Chief Complaint:  Bloody stools and weakness  Subjective:  Team reached out and said that his wife had requested to take him home today after HD.  Seen and examined on dialysis.  Blood pressure 143/70 and HR 70.  LUE AVF in use.  Procedure supervised.  Tolerating goal.   He states that he's cold   Review of systems:       Denies overt shortness of breath or chest pain Denies n/v Limited due to mild cognitive impairment   ---------- Background on consult:  Reason for Consult: ESRD pt w/ bloody stools, gen'd weakness, missed HD HPI: The patient is a 83 y.o. year-old w/ PMH as below who presented to [facility] ED early this morning complaining of less than 1 day of bloody stool, generalized weakness and missed dialysis.  In ED hemoglobin 6.9, K+ 6.5, WBC 13K, sodium 123, creatinine 12.9.  He was started on transfusion of PRBCs and given some D50 and insulin  for high potassium.  CT of the abdomen showed some findings of mild ischemic colitis possibly.  Case was discussed with GI at [facility] and patient was transferred to [facility].  We are asked to see for dialysis    Intake/Output Summary (Last 24 hours) at 07/03/2023 1018 Last data filed at 07/02/2023 1224 Gross per 24 hour  Intake 237 ml  Output --  Net 237 ml    Vitals:  Vitals:   07/03/23 0848 07/03/23 0900 07/03/23 0930 07/03/23 1000  BP:  130/66 131/69 138/69  Pulse:  65 (!) 59 79  Resp:  19 16 14   Temp:      TempSrc:      SpO2:  100% 100% 100%  Weight: 98.6 kg     Height: 6\' 2"  (1.88 m)        Physical Exam:       General elderly male in bed in no acute distress HEENT normocephalic atraumatic extraocular movements intact sclera anicteric Neck supple trachea midline Lungs clear to auscultation bilaterally normal work of breathing at rest on room air Heart S1S2 no rub Abdomen soft nontender nondistended Extremities 1-2+  edema RLE > LLE Psych normal mood and affect. Smiles. Neuro - awake and conversant. Oriented to person.  Follows commands Access: LUE AVF in use   Medications reviewed   Labs:     Latest Ref Rng & Units 07/03/2023    8:45 AM 07/02/2023    6:23 AM 07/01/2023    9:22 AM  BMP  Glucose 70 - 99 mg/dL 90  409  83   BUN 8 - 23 mg/dL 60  57  83   Creatinine 0.61 - 1.24 mg/dL 81.19  1.47  82.95   Sodium 135 - 145 mmol/L 134  133  134   Potassium 3.5 - 5.1 mmol/L 3.8  3.8  4.3   Chloride 98 - 111 mmol/L 95  91  92   CO2 22 - 32 mmol/L 25  24  22    Calcium  8.9 - 10.3 mg/dL 7.9  8.3  8.1    Outpatient HD orders:  [facility] [place]  3 hours 15 minutes  EDW 91.5 kg  BF 300 ml/min DF 600 ml/min Monday and Friday  3K/2.5 ca Last post weight: 91.3 kg on 5/5 Meds: Mircera every 4 weeks but doesn't look like has received in the past several weeks due to being hospitalized per [facility] RN; Venofer 50 mg  weekly No activated vit D No heparin      Assessment/ Plan: Acute GI bleeding/ rectal bleeding: getting 2u prbc's. H/o recent RP bleed/ hematoma. On DAPT due to hx of CABG and recent AAA repair. GI consulting - appreciate assistance.  ESRD: usually on HD 2x per week, Mon/Friday. Missed 2-3 HD sessions prior to admission and got HD on 5/18 PM.   Recommend HD MWF while here given overload.  He agrees. HTN: acceptable on current regimen  Hyperkalemia - improved with HD and renal diet  Anemia of esrd: compounded by the GI bleed with acute blood loss.  PRBC's per primary team.  Rec'd aranesp  on 60 mcg on 5/10 prior admit.  Started aranesp  150 mcg weekly on Tuesdays   Secondary hyperparathyroidism: hyperphos. Cont phoslo  with meals and we have increased to three times a week HD while here Recent AAA repair  Mild cognitive impairment - noted per charting.  we have had to gently redirect him here. No issue today.  Would continue goals of care discussions outpatient with his known providers  Atrial  fib: is on BB; per primary team Chronic syst HF: last echo EF 40%, G2DD   Disposition - per primary team   Nan Aver, MD 07/03/2023 10:26 AM

## 2023-07-04 DIAGNOSIS — N186 End stage renal disease: Secondary | ICD-10-CM | POA: Diagnosis not present

## 2023-07-04 DIAGNOSIS — J449 Chronic obstructive pulmonary disease, unspecified: Secondary | ICD-10-CM | POA: Diagnosis not present

## 2023-07-04 DIAGNOSIS — G894 Chronic pain syndrome: Secondary | ICD-10-CM | POA: Diagnosis not present

## 2023-07-04 DIAGNOSIS — I132 Hypertensive heart and chronic kidney disease with heart failure and with stage 5 chronic kidney disease, or end stage renal disease: Secondary | ICD-10-CM | POA: Diagnosis not present

## 2023-07-04 DIAGNOSIS — Z7982 Long term (current) use of aspirin: Secondary | ICD-10-CM | POA: Diagnosis not present

## 2023-07-04 DIAGNOSIS — Z48812 Encounter for surgical aftercare following surgery on the circulatory system: Secondary | ICD-10-CM | POA: Diagnosis not present

## 2023-07-04 DIAGNOSIS — K219 Gastro-esophageal reflux disease without esophagitis: Secondary | ICD-10-CM | POA: Diagnosis not present

## 2023-07-04 DIAGNOSIS — I5022 Chronic systolic (congestive) heart failure: Secondary | ICD-10-CM | POA: Diagnosis not present

## 2023-07-04 DIAGNOSIS — Z556 Problems related to health literacy: Secondary | ICD-10-CM | POA: Diagnosis not present

## 2023-07-04 DIAGNOSIS — D631 Anemia in chronic kidney disease: Secondary | ICD-10-CM | POA: Diagnosis not present

## 2023-07-04 DIAGNOSIS — N2581 Secondary hyperparathyroidism of renal origin: Secondary | ICD-10-CM | POA: Diagnosis not present

## 2023-07-04 DIAGNOSIS — H3530 Unspecified macular degeneration: Secondary | ICD-10-CM | POA: Diagnosis not present

## 2023-07-04 DIAGNOSIS — I252 Old myocardial infarction: Secondary | ICD-10-CM | POA: Diagnosis not present

## 2023-07-04 DIAGNOSIS — Z8673 Personal history of transient ischemic attack (TIA), and cerebral infarction without residual deficits: Secondary | ICD-10-CM | POA: Diagnosis not present

## 2023-07-04 DIAGNOSIS — Z7902 Long term (current) use of antithrombotics/antiplatelets: Secondary | ICD-10-CM | POA: Diagnosis not present

## 2023-07-04 DIAGNOSIS — Z992 Dependence on renal dialysis: Secondary | ICD-10-CM | POA: Diagnosis not present

## 2023-07-04 DIAGNOSIS — I2510 Atherosclerotic heart disease of native coronary artery without angina pectoris: Secondary | ICD-10-CM | POA: Diagnosis not present

## 2023-07-04 DIAGNOSIS — E782 Mixed hyperlipidemia: Secondary | ICD-10-CM | POA: Diagnosis not present

## 2023-07-04 DIAGNOSIS — I4811 Longstanding persistent atrial fibrillation: Secondary | ICD-10-CM | POA: Diagnosis not present

## 2023-07-04 DIAGNOSIS — S51812D Laceration without foreign body of left forearm, subsequent encounter: Secondary | ICD-10-CM | POA: Diagnosis not present

## 2023-07-04 DIAGNOSIS — H409 Unspecified glaucoma: Secondary | ICD-10-CM | POA: Diagnosis not present

## 2023-07-07 ENCOUNTER — Other Ambulatory Visit: Payer: Self-pay

## 2023-07-07 ENCOUNTER — Telehealth: Payer: Self-pay | Admitting: Gastroenterology

## 2023-07-07 ENCOUNTER — Telehealth: Payer: Self-pay

## 2023-07-07 ENCOUNTER — Encounter: Payer: Self-pay | Admitting: *Deleted

## 2023-07-07 NOTE — Telephone Encounter (Signed)
 Spoke with Jullie Oiler who spoke to pt's wife, who has c/o pt still having black, tarry stools and BLE swelling (R>L) with pain. Incision site is clean/dry/intact without any concerns. Pt has not been able to make it to HD since last Friday and wife is unable to get him to leave the house. MD is aware and has advised pt to be seen in ED. I have relayed this to Bruce who had the wife on hold. She is also planning to reach out to GI regarding the stool.

## 2023-07-07 NOTE — Patient Outreach (Signed)
 Complex Care Management   Visit Note  07/07/2023  Name:  Ronald [name] MRN: [id number] DOB: 1940-09-18  Situation: Referral received for Complex Care Management related to NSTEMI/RN CM support I obtained verbal consent from Caregiver Patient.  Visit completed with [name]  on the phone  Post abdominal aorta aneurysm repair "Home but not good" per spouse  Wife voices concern with admission & discharge of patient as she reports he "Still has the same problems that he was admitted for" to include bleeding, diarrhea (black "gel"), pain - Reports not provided  answers from hospital staff Wife stayed majority of time he was hospitalize, when she was not there the daughter stayed  Last Hemodialysis (HD), 07/03/23 before hospital discharge Walks with his walker  Patient not able to go to 07/06/23 nor 07/07/23 Hemodialysis (HD) sessions at [facility] [address] [place]  He generally attends [facility] 1030-1100 He did not sleep last 2 nights, issues with breathing, wheezing, pain on top of right leg, both legs swollen (more on right than left) screams out in pain with sitting on side of the bed or any movement Not being able to tolerate HD r/t pain & not tolerating sitting up for long periods of time Wife voices understanding of the importance of Hemodialysis (HD), Wife reports call attempts to HD SW for the last 2 weeks,  asked again today  Reports the post surgery incision site is without redness, drainage Has some swelling & pain  Informed to take only tylenol  no Tramodol per wife   Wife called [facility]- PT visit on Saturday 07/04/23  No Veteran's nor long term care benefits & can not afford out of pocket personal care services      Background:   Past Medical History:  Diagnosis Date   CHF (congestive heart failure) (HCC)    ESRD on hemodialysis (HCC)    GERD (gastroesophageal reflux disease)    Glaucoma    Hypertension    Macular degeneration     Myocardial infarction [facility])    1999   Prostate cancer (HCC) 02/2018   Stroke (HCC)    no deficits; had stroke while trying to place "stents in legs".    Assessment: Patient Reported Symptoms:  Cognitive Cognitive Status: Struggling with memory recall, Confused or disoriented, Able to follow simple commands Cognitive/Intellectual Conditions Management [RPT]: None reported or documented in medical history or problem list   Health Maintenance Behaviors: Annual physical exam, Sleep adequate Healing Pattern: Average Health Facilitated by: Rest, Pain control  Neurological Neurological Review of Symptoms: Weakness Neurological Management Strategies: Adequate rest Neurological Self-Management Outcome: 3 (uncertain)  HEENT HEENT Symptoms Reported: Change or loss of hearing HEENT Conditions: Ear problem(s), Vision problem(s) Vision Problems: glaucoma HEENT Management Strategies: Medication therapy, Routine screening, Adequate rest HEENT Self-Management Outcome: 3 (uncertain) Ear problem(s), Vision problem(s)  Cardiovascular Cardiovascular Symptoms Reported: Fatigue, Swelling in legs or feet Cardiovascular Conditions: Heart failure, Hypertension, Myocardial infarction Cardiovascular Management Strategies: Adequate rest, Routine screening Cardiovascular Self-Management Outcome: 3 (uncertain)  Respiratory Respiratory Symptoms Reported: Wheezing, Shortness of breath Respiratory Conditions: COPD Respiratory Self-Management Outcome: 3 (uncertain)  Endocrine Patient reports the following symptoms related to hypoglycemia or hyperglycemia : Shortness of breath, Weakness or fatigue Is patient diabetic?: No Endocrine Conditions: Parathyroid disorder Endocrine Management Strategies: Routine screening, Adequate rest Endocrine Self-Management Outcome: 4 (good)  Gastrointestinal Gastrointestinal Symptoms Reported: Diarrhea, Other Other Gastrointestinal Symptoms: reporting Dark tarry stool "Black gel"  per wife Gastrointestinal Conditions: Abdominal pain, Bleeding, Diarrhea, Reflux/heartburn, Nausea  Gastrointestinal Management Strategies: Adequate rest, Medication therapy, Incontinence garment/pad Gastrointestinal Self-Management Outcome: 3 (uncertain)    Genitourinary Genitourinary Symptoms Reported: Other Other Genitourinary Symptoms: Has not been to Dialysis since 07/03/23 hospital discharge as patient reported to be too weak to sit for long periods of time without screaming out in pain - Wife reports they were only informed to take Tylenol  no Tramadol  Genitourinary Conditions: Chronic kidney disease, End-stage renal disease, Prostate cancer Genitourinary Management Strategies: Adequate rest, Hemodialysis, Medical device Hemodialysis Schedule: ususally Monday, Wednesday & Fridays at [facility] [address] in [place] Hemodialysis Last Treatment: 07/03/23 Genitourinary Self-Management Outcome: 2 (bad)  Integumentary Integumentary Symptoms Reported: Incision, Other Additional Integumentary Details: Reports the incision site is without redness, drainage Has some swelling & pain Skin Conditions: Other Other Skin Conditions: post abdmonial aorta aneurysm incision site Skin Management Strategies: Adequate rest, Routine screening Skin Self-Management Outcome: 3 (uncertain)  Musculoskeletal Musculoskelatal Symptoms Reviewed: Difficulty walking, Unsteady gait, Weakness Musculoskeletal Management Strategies: Adequate rest, Medical device Musculoskeletal Self-Management Outcome: 3 (uncertain)      Psychosocial Psychosocial Symptoms Reported: Other Other Psychosocial Conditions: cognitive memory impairment Behavioral Health Conditions: Other Other Behavorial Health Conditions: cognitive memory impairment Behavioral Management Strategies: Adequate rest, Medication therapy, Support system Behavioral Health Self-Management Outcome: 3 (uncertain) Major Change/Loss/Stressor/Fears (CP): Medical  condition, self, Resources Techniques to [place] with Loss/Stress/Change: Medication Quality of Family Relationships: helpful, involved Do you feel physically threatened by others?: No      05/22/2023    8:43 PM  Depression screen PHQ 2/9  Decreased Interest 1  Down, Depressed, Hopeless 1  PHQ - 2 Score 2  Altered sleeping 1  Tired, decreased energy 1  Change in appetite 1  Feeling bad or failure about yourself  0  Trouble concentrating 0  Moving slowly or fidgety/restless 0  Suicidal thoughts 0  PHQ-9 Score 5    There were no vitals filed for this visit.  Medications Reviewed Today   Medications were not reviewed in this encounter     Recommendation:   Specialty provider follow-up t t  Follow Up Plan:   Telephone follow up appointment date/time:  1  Haylynn Pha L. Mcarthur Speedy, RN, BSN, CCM [place]  [facility], [facility] RN Care Manager Direct Dial: [phone]  Fax: [phone]

## 2023-07-07 NOTE — Patient Instructions (Signed)
 Visit Information  Thank you for taking time to visit with me today. Please don't hesitate to contact me if I can be of assistance to you before our next scheduled appointment.  Your next care management appointment is by telephone on 07/08/23 at 1 pm  Please review the post abdominal aorta aneurysm repair education sent to you via email. Please answer & return any calls to staff from [facility], [facility], Dr Fulton Job & Dr Cherryl Corona offices  Please call the care guide team at [phone] if you need to cancel, schedule, or reschedule an appointment.   Please call the Suicide and Crisis Lifeline: 988 call the [place]  National Suicide Prevention Lifeline: 3084772466 or TTY: 319-089-8665 TTY (438)120-8342) to talk to a trained counselor call 1-800-273-TALK (toll free, 24 hour hotline) call the [facility]: [phone] call 911 if you are experiencing a Mental Health or Behavioral Health Crisis or need someone to talk to.  Nasrin Lanzo L. Mcarthur Speedy, RN, BSN, CCM [place]  [facility], [facility] RN Care Manager Direct Dial: [phone]  Fax: [phone]

## 2023-07-07 NOTE — Telephone Encounter (Signed)
 Inbound call from Burdette Carolin at Dr. Quentin Brunner office requesting to speak with Dr. Milus Alpha nurse. States Dr. Cherryl Corona recently saw patient at [facility] and would like to discuss next steps for patient. States patient is still having black stools. Requesting a call back at [phone]. Please advise, thank you.

## 2023-07-08 ENCOUNTER — Encounter: Payer: Self-pay | Admitting: *Deleted

## 2023-07-08 ENCOUNTER — Inpatient Hospital Stay (HOSPITAL_COMMUNITY)
Admission: EM | Admit: 2023-07-08 | Discharge: 2023-07-11 | DRG: 291 | Disposition: A | Discharge status: Home Health | Attending: Internal Medicine | Admitting: Internal Medicine

## 2023-07-08 ENCOUNTER — Other Ambulatory Visit: Payer: Self-pay

## 2023-07-08 ENCOUNTER — Other Ambulatory Visit: Admitting: *Deleted

## 2023-07-08 ENCOUNTER — Emergency Department (HOSPITAL_COMMUNITY)

## 2023-07-08 ENCOUNTER — Encounter (HOSPITAL_COMMUNITY): Payer: Self-pay | Admitting: Emergency Medicine

## 2023-07-08 DIAGNOSIS — Z992 Dependence on renal dialysis: Secondary | ICD-10-CM

## 2023-07-08 DIAGNOSIS — Z7189 Other specified counseling: Secondary | ICD-10-CM | POA: Diagnosis not present

## 2023-07-08 DIAGNOSIS — H409 Unspecified glaucoma: Secondary | ICD-10-CM | POA: Diagnosis not present

## 2023-07-08 DIAGNOSIS — E8770 Fluid overload, unspecified: Secondary | ICD-10-CM | POA: Diagnosis not present

## 2023-07-08 DIAGNOSIS — E8779 Other fluid overload: Secondary | ICD-10-CM | POA: Diagnosis not present

## 2023-07-08 DIAGNOSIS — Z1152 Encounter for screening for COVID-19: Secondary | ICD-10-CM | POA: Diagnosis not present

## 2023-07-08 DIAGNOSIS — Z951 Presence of aortocoronary bypass graft: Secondary | ICD-10-CM

## 2023-07-08 DIAGNOSIS — I509 Heart failure, unspecified: Secondary | ICD-10-CM

## 2023-07-08 DIAGNOSIS — Z79899 Other long term (current) drug therapy: Secondary | ICD-10-CM

## 2023-07-08 DIAGNOSIS — I5020 Unspecified systolic (congestive) heart failure: Secondary | ICD-10-CM | POA: Diagnosis not present

## 2023-07-08 DIAGNOSIS — R0989 Other specified symptoms and signs involving the circulatory and respiratory systems: Secondary | ICD-10-CM | POA: Diagnosis not present

## 2023-07-08 DIAGNOSIS — K219 Gastro-esophageal reflux disease without esophagitis: Secondary | ICD-10-CM | POA: Diagnosis not present

## 2023-07-08 DIAGNOSIS — R0600 Dyspnea, unspecified: Secondary | ICD-10-CM | POA: Diagnosis not present

## 2023-07-08 DIAGNOSIS — Z809 Family history of malignant neoplasm, unspecified: Secondary | ICD-10-CM

## 2023-07-08 DIAGNOSIS — R918 Other nonspecific abnormal finding of lung field: Secondary | ICD-10-CM | POA: Diagnosis not present

## 2023-07-08 DIAGNOSIS — N289 Disorder of kidney and ureter, unspecified: Principal | ICD-10-CM

## 2023-07-08 DIAGNOSIS — Z515 Encounter for palliative care: Secondary | ICD-10-CM

## 2023-07-08 DIAGNOSIS — Z743 Need for continuous supervision: Secondary | ICD-10-CM | POA: Diagnosis not present

## 2023-07-08 DIAGNOSIS — I5023 Acute on chronic systolic (congestive) heart failure: Secondary | ICD-10-CM | POA: Diagnosis not present

## 2023-07-08 DIAGNOSIS — D649 Anemia, unspecified: Secondary | ICD-10-CM | POA: Diagnosis present

## 2023-07-08 DIAGNOSIS — N400 Enlarged prostate without lower urinary tract symptoms: Secondary | ICD-10-CM | POA: Diagnosis present

## 2023-07-08 DIAGNOSIS — Z6827 Body mass index (BMI) 27.0-27.9, adult: Secondary | ICD-10-CM

## 2023-07-08 DIAGNOSIS — R0689 Other abnormalities of breathing: Secondary | ICD-10-CM

## 2023-07-08 DIAGNOSIS — R627 Adult failure to thrive: Secondary | ICD-10-CM | POA: Diagnosis not present

## 2023-07-08 DIAGNOSIS — I252 Old myocardial infarction: Secondary | ICD-10-CM

## 2023-07-08 DIAGNOSIS — N186 End stage renal disease: Secondary | ICD-10-CM | POA: Diagnosis present

## 2023-07-08 DIAGNOSIS — Z8546 Personal history of malignant neoplasm of prostate: Secondary | ICD-10-CM

## 2023-07-08 DIAGNOSIS — I132 Hypertensive heart and chronic kidney disease with heart failure and with stage 5 chronic kidney disease, or end stage renal disease: Principal | ICD-10-CM | POA: Diagnosis present

## 2023-07-08 DIAGNOSIS — Z91158 Patient's noncompliance with renal dialysis for other reason: Secondary | ICD-10-CM

## 2023-07-08 DIAGNOSIS — D631 Anemia in chronic kidney disease: Secondary | ICD-10-CM | POA: Diagnosis not present

## 2023-07-08 DIAGNOSIS — G8929 Other chronic pain: Secondary | ICD-10-CM | POA: Diagnosis present

## 2023-07-08 DIAGNOSIS — I2510 Atherosclerotic heart disease of native coronary artery without angina pectoris: Secondary | ICD-10-CM | POA: Diagnosis present

## 2023-07-08 DIAGNOSIS — N2581 Secondary hyperparathyroidism of renal origin: Secondary | ICD-10-CM | POA: Diagnosis present

## 2023-07-08 DIAGNOSIS — I10 Essential (primary) hypertension: Secondary | ICD-10-CM | POA: Diagnosis present

## 2023-07-08 DIAGNOSIS — Z66 Do not resuscitate: Secondary | ICD-10-CM | POA: Diagnosis not present

## 2023-07-08 DIAGNOSIS — I714 Abdominal aortic aneurysm, without rupture, unspecified: Secondary | ICD-10-CM | POA: Diagnosis not present

## 2023-07-08 DIAGNOSIS — E785 Hyperlipidemia, unspecified: Secondary | ICD-10-CM | POA: Diagnosis not present

## 2023-07-08 DIAGNOSIS — F03C11 Unspecified dementia, severe, with agitation: Secondary | ICD-10-CM | POA: Diagnosis present

## 2023-07-08 DIAGNOSIS — R109 Unspecified abdominal pain: Secondary | ICD-10-CM | POA: Diagnosis not present

## 2023-07-08 DIAGNOSIS — H3530 Unspecified macular degeneration: Secondary | ICD-10-CM | POA: Diagnosis not present

## 2023-07-08 DIAGNOSIS — Z91199 Patient's noncompliance with other medical treatment and regimen due to unspecified reason: Secondary | ICD-10-CM

## 2023-07-08 DIAGNOSIS — E871 Hypo-osmolality and hyponatremia: Secondary | ICD-10-CM | POA: Diagnosis present

## 2023-07-08 DIAGNOSIS — F03C4 Unspecified dementia, severe, with anxiety: Secondary | ICD-10-CM | POA: Diagnosis present

## 2023-07-08 DIAGNOSIS — F17210 Nicotine dependence, cigarettes, uncomplicated: Secondary | ICD-10-CM | POA: Diagnosis not present

## 2023-07-08 DIAGNOSIS — Z7902 Long term (current) use of antithrombotics/antiplatelets: Secondary | ICD-10-CM

## 2023-07-08 DIAGNOSIS — R059 Cough, unspecified: Secondary | ICD-10-CM | POA: Diagnosis not present

## 2023-07-08 DIAGNOSIS — R062 Wheezing: Secondary | ICD-10-CM | POA: Diagnosis not present

## 2023-07-08 DIAGNOSIS — K625 Hemorrhage of anus and rectum: Secondary | ICD-10-CM | POA: Diagnosis not present

## 2023-07-08 DIAGNOSIS — I4891 Unspecified atrial fibrillation: Secondary | ICD-10-CM | POA: Diagnosis present

## 2023-07-08 DIAGNOSIS — R531 Weakness: Secondary | ICD-10-CM | POA: Diagnosis not present

## 2023-07-08 DIAGNOSIS — R0602 Shortness of breath: Secondary | ICD-10-CM | POA: Diagnosis not present

## 2023-07-08 DIAGNOSIS — Z8673 Personal history of transient ischemic attack (TIA), and cerebral infarction without residual deficits: Secondary | ICD-10-CM

## 2023-07-08 DIAGNOSIS — I480 Paroxysmal atrial fibrillation: Secondary | ICD-10-CM | POA: Diagnosis not present

## 2023-07-08 DIAGNOSIS — I700 Atherosclerosis of aorta: Secondary | ICD-10-CM | POA: Diagnosis not present

## 2023-07-08 LAB — COMPREHENSIVE METABOLIC PANEL WITH GFR
ALT: 6 U/L (ref 0–44)
AST: 17 U/L (ref 15–41)
Albumin: 2.6 g/dL — ABNORMAL LOW (ref 3.5–5.0)
Alkaline Phosphatase: 97 U/L (ref 38–126)
Anion gap: 19 — ABNORMAL HIGH (ref 5–15)
BUN: 51 mg/dL — ABNORMAL HIGH (ref 8–23)
CO2: 22 mmol/L (ref 22–32)
Calcium: 7.9 mg/dL — ABNORMAL LOW (ref 8.9–10.3)
Chloride: 88 mmol/L — ABNORMAL LOW (ref 98–111)
Creatinine, Ser: 13.13 mg/dL — ABNORMAL HIGH (ref 0.61–1.24)
GFR, Estimated: 3 mL/min — ABNORMAL LOW (ref >60)
Glucose, Bld: 120 mg/dL — ABNORMAL HIGH (ref 70–99)
Potassium: 3.8 mmol/L (ref 3.5–5.1)
Sodium: 129 mmol/L — ABNORMAL LOW (ref 135–145)
Total Bilirubin: 0.8 mg/dL (ref 0.0–1.2)
Total Protein: 5.9 g/dL — ABNORMAL LOW (ref 6.5–8.1)

## 2023-07-08 LAB — CBC
HCT: 25.8 % — ABNORMAL LOW (ref 39.0–52.0)
Hemoglobin: 8.5 g/dL — ABNORMAL LOW (ref 13.0–17.0)
MCH: 30.7 pg (ref 26.0–34.0)
MCHC: 32.9 g/dL (ref 30.0–36.0)
MCV: 93.1 fL (ref 80.0–100.0)
Platelets: 281 10*3/uL (ref 150–400)
RBC: 2.77 MIL/uL — ABNORMAL LOW (ref 4.22–5.81)
RDW: 17.8 % — ABNORMAL HIGH (ref 11.5–15.5)
WBC: 5.8 10*3/uL (ref 4.0–10.5)
nRBC: 0 % (ref 0.0–0.2)

## 2023-07-08 LAB — POC OCCULT BLOOD, ED: Fecal Occult Blood: NEGATIVE

## 2023-07-08 MED ORDER — CALCIUM ACETATE (PHOS BINDER) 667 MG PO CAPS
1334.0000 mg | ORAL_CAPSULE | Freq: Three times a day (TID) | ORAL | Status: DC
  Administered 2023-07-09 – 2023-07-11 (×7): 1334 mg via ORAL
  Filled 2023-07-08 (×8): qty 2

## 2023-07-08 MED ORDER — ATORVASTATIN CALCIUM 40 MG PO TABS
80.0000 mg | ORAL_TABLET | Freq: Every morning | ORAL | Status: DC
  Administered 2023-07-09 – 2023-07-11 (×3): 80 mg via ORAL
  Filled 2023-07-08 (×3): qty 2

## 2023-07-08 MED ORDER — MIRTAZAPINE 15 MG PO TABS
15.0000 mg | ORAL_TABLET | Freq: Every day | ORAL | Status: DC
  Administered 2023-07-08 – 2023-07-10 (×3): 15 mg via ORAL
  Filled 2023-07-08 (×3): qty 1

## 2023-07-08 MED ORDER — METOPROLOL SUCCINATE ER 25 MG PO TB24
50.0000 mg | ORAL_TABLET | Freq: Every day | ORAL | Status: DC
  Administered 2023-07-09 – 2023-07-11 (×2): 50 mg via ORAL
  Filled 2023-07-08 (×3): qty 2

## 2023-07-08 MED ORDER — CHLORHEXIDINE GLUCONATE CLOTH 2 % EX PADS
6.0000 | MEDICATED_PAD | Freq: Every day | CUTANEOUS | Status: DC
  Administered 2023-07-09 – 2023-07-11 (×3): 6 via TOPICAL

## 2023-07-08 MED ORDER — ZOLPIDEM TARTRATE 5 MG PO TABS
5.0000 mg | ORAL_TABLET | Freq: Every evening | ORAL | Status: DC | PRN
  Administered 2023-07-08: 5 mg via ORAL
  Filled 2023-07-08: qty 1

## 2023-07-08 MED ORDER — TAMSULOSIN HCL 0.4 MG PO CAPS
0.4000 mg | ORAL_CAPSULE | Freq: Every day | ORAL | Status: DC
  Administered 2023-07-08 – 2023-07-10 (×3): 0.4 mg via ORAL
  Filled 2023-07-08 (×3): qty 1

## 2023-07-08 MED ORDER — ONDANSETRON HCL 4 MG/2ML IJ SOLN: 4.0000 mg | Freq: Four times a day (QID) | INTRAMUSCULAR | Status: DC | PRN

## 2023-07-08 MED ORDER — PANTOPRAZOLE SODIUM 40 MG PO TBEC
40.0000 mg | DELAYED_RELEASE_TABLET | Freq: Two times a day (BID) | ORAL | Status: DC
  Administered 2023-07-08 – 2023-07-11 (×6): 40 mg via ORAL
  Filled 2023-07-08 (×6): qty 1

## 2023-07-08 MED ORDER — AMIODARONE HCL 200 MG PO TABS
200.0000 mg | ORAL_TABLET | Freq: Every day | ORAL | Status: DC
  Administered 2023-07-09 – 2023-07-11 (×3): 200 mg via ORAL
  Filled 2023-07-08 (×4): qty 1

## 2023-07-08 MED ORDER — CLOPIDOGREL BISULFATE 75 MG PO TABS
75.0000 mg | ORAL_TABLET | Freq: Every day | ORAL | Status: DC
  Administered 2023-07-08 – 2023-07-11 (×4): 75 mg via ORAL
  Filled 2023-07-08 (×4): qty 1

## 2023-07-08 MED ORDER — ACETAMINOPHEN 325 MG PO TABS
650.0000 mg | ORAL_TABLET | Freq: Four times a day (QID) | ORAL | Status: DC | PRN
Start: 2023-07-08 — End: 2023-07-11
  Administered 2023-07-08 – 2023-07-10 (×3): 650 mg via ORAL
  Filled 2023-07-08 (×4): qty 2

## 2023-07-08 MED ORDER — ACETAMINOPHEN 650 MG RE SUPP: 650.0000 mg | Freq: Four times a day (QID) | RECTAL | Status: DC | PRN

## 2023-07-08 MED ORDER — ONDANSETRON HCL 4 MG PO TABS: 4.0000 mg | ORAL_TABLET | Freq: Four times a day (QID) | ORAL | Status: DC | PRN

## 2023-07-08 NOTE — Telephone Encounter (Signed)
 Patient has a recent hx with Dr. Cherryl Corona inpatient.  [facility] was consulted for UGI bleed while inpatient. The bleeding had believed to be resolved and the patient was not interested in pursing endoscopic evaluation while he was inpatient.  Per other notes in the chart from Petrina Bras, RN, the patient has not been able to make his HD appointments since discharge.  Patient had reported to Ms. Mcarthur Speedy that he was passing dark stools, having stabbing abdominal pain and lower extremity edema while at home.  I discussed with Dr. Cherryl Corona this am.  The patient should return to the ED for evaluation.  He is not a candidate for care in our endoscopy center due to his co-morbidities and should return to the hospital for evaluation.  I reported Dr. Milus Alpha recommendations to Ms. Mcarthur Speedy

## 2023-07-08 NOTE — ED Triage Notes (Addendum)
 Dark black tarry stools, weakness and has not had dialysis since Friday.  [facility] for this problem but told he could not have procedure done d/t current health problems.  Pt has some significant bruising to RT side/ hip area.

## 2023-07-08 NOTE — H&P (Signed)
 History and Physical    Ronald [name] JXB:[id number] DOB: 19-Jun-1940 DOA: 07/08/2023  PCP: Omie Bickers, MD   Patient coming from: Home  Chief Complaint: Unk Garb  HPI: Ronald [name] is a 83 y.o. male with medical history significant for CAD/CABG, HFmrEF, ESRD on HD MF, CVA, HTN, neurocognitive disorder, A-fib not on [facility], AAA with retroperitoneal hematoma s/p EVAR on 5/17 by Dr. Fulton Job in recent discharge on 5/23 after hospitalization for ABLA and GI bleed.  At that time, he was transfused 3 units of PRBC and GI recommended continuing on Protonix  twice daily and he was resumed on Plavix  and no longer on aspirin .  He was discharged on Augmentin for 2 more days to complete course of treatment for ischemic colitis.  He came back to the ED with worsening weakness and shortness of breath in the setting of missed hemodialysis this past Monday.  Spouse states that they have been having difficulty with transport to hemodialysis and they had refused SNF on prior admission.  He continues to have some ongoing dark stools and chronic abdominal pain.   ED Course: Vital signs are stable and patient is on room air.  Laboratory data with some hyponatremia and stable hemoglobin levels noted.  EDP has spoken with nephrology with plans for hemodialysis.  Review of Systems: Reviewed as noted above, otherwise negative.  Past Medical History:  Diagnosis Date   CHF (congestive heart failure) (HCC)    ESRD on hemodialysis (HCC)    GERD (gastroesophageal reflux disease)    Glaucoma    Hypertension    Macular degeneration    Myocardial infarction [facility])    1999   Prostate cancer (HCC) 02/2018   Stroke (HCC)    no deficits; had stroke while trying to place "stents in legs".    Past Surgical History:  Procedure Laterality Date   ABDOMINAL AORTIC ANEURYSM REPAIR  2012   IN [place]    ABDOMINAL AORTIC ENDOVASCULAR STENT GRAFT N/A 06/17/2023   Procedure: INSERTION, ENDOVASCULAR STENT GRAFT,  AORTA, ABDOMINAL;  Surgeon: Young Hensen, MD;  Location: [facility] OR;  Service: Vascular;  Laterality: N/A;   APPENDECTOMY     AV FISTULA PLACEMENT Left 10/16/2020   Procedure: LEFT ARM ARTERIOVENOUS (AV) FISTULA CREATION;  Surgeon: Mayo Speck, MD;  Location: [facility] ORS;  Service: Vascular;  Laterality: Left;   CORONARY ARTERY BYPASS GRAFT  01/1998   3 vessels 18 years ago   ENDARTERECTOMY FEMORAL Right 06/17/2023   Procedure: ENDARTERECTOMY, COMMON FEMORAL AND SUPERFICIAL FEMORAL;  Surgeon: Young Hensen, MD;  Location: [facility] OR;  Service: Vascular;  Laterality: Right;   INGUINAL HERNIA REPAIR Right 09/20/2015   Procedure: REPAIR RIGHT INGUINAL HERNIA  WITH MESH;  Surgeon: Franki Isles, MD;  Location: [facility] ORS;  Service: General;  Laterality: Right;   INSERTION OF DIALYSIS CATHETER Right 08/02/2020   Procedure: INSERTION OF RIGHT TUNNELED INTERNAL JUGULAR  DIALYSIS CATHETER;  Surgeon: Mayo Speck, MD;  Location: [facility];  Service: Vascular;  Laterality: Right;   INSERTION OF ILIAC STENT Right 06/17/2023   Procedure: INSERTION, STENT, ARTERY, ILIAC;  Surgeon: Young Hensen, MD;  Location: [facility] OR;  Service: Vascular;  Laterality: Right;   LOWER EXTREMITY ANGIOGRAM Right 06/17/2023   Procedure: [employer], LOWER EXTREMITY;  Surgeon: Young Hensen, MD;  Location: [facility] OR;  Service: Vascular;  Laterality: Right;   PATCH ANGIOPLASTY Right 06/17/2023   Procedure: ANGIOPLASTY, USING PATCH GRAFT USING Angelyn Barges;  Surgeon: Young Hensen, MD;  Location: [facility] OR;  Service: Vascular;  Laterality: Right;   REMOVAL OF A DIALYSIS CATHETER N/A 03/19/2021   Procedure: MINOR REMOVAL OF A TUNNELED DIALYSIS CATHETER;  Surgeon: Mayo Speck, MD;  Location: [facility];  Service: Vascular;  Laterality: N/A;   tumor removal from bladder     ULTRASOUND GUIDANCE FOR VASCULAR ACCESS Bilateral 06/17/2023   Procedure: ULTRASOUND GUIDANCE, FOR VASCULAR ACCESS;  Surgeon: Young Hensen, MD;  Location: [facility] OR;   Service: Vascular;  Laterality: Bilateral;     reports that he has quit smoking. His smoking use included cigarettes. He started smoking about 65 years ago. He has a 33 pack-year smoking history. He has never used smokeless tobacco. He reports that he does not drink alcohol and does not use drugs.  No Known Allergies  Family History  Problem Relation Age of Onset   Cancer Father     Prior to Admission medications   Medication Sig Start Date End Date Taking? Authorizing Provider  amiodarone  (PACERONE ) 200 MG tablet Take 1 tablet (200 mg total) by mouth daily. 07/03/23   Gonfa, Taye T, MD  atorvastatin  (LIPITOR) 80 MG tablet Take 80 mg by mouth in the morning. 07/05/19   [provider]  calcium  acetate (PHOSLO ) 667 MG capsule Take 1,334 mg by mouth 3 (three) times daily. citracal    [provider]  clopidogrel  (PLAVIX ) 75 MG tablet Take 1 tablet (75 mg total) by mouth daily. 06/23/23   Baglia, Corrina, PA-C  metoprolol  succinate (TOPROL  XL) 50 MG 24 hr tablet Take 1 tablet (50 mg total) by mouth daily. Take with or immediately following a meal. 06/23/23 06/22/24  Baglia, Corrina, PA-C  mirtazapine  (REMERON ) 15 MG tablet Take 15 mg by mouth at bedtime. 05/02/23   [provider]  pantoprazole  (PROTONIX ) 40 MG tablet Take 1 tablet (40 mg total) by mouth 2 (two) times daily for 60 days, THEN 1 tablet (40 mg total) daily. 07/02/23 09/30/23  Gonfa, Taye T, MD  polyethylene glycol powder (GLYCOLAX /MIRALAX ) 17 GM/SCOOP powder Take 17 g by mouth 2 (two) times daily as needed for mild constipation. Patient not taking: Reported on 07/07/2023 07/03/23   Fonnie Iba I, MD  tamsulosin  (FLOMAX ) 0.4 MG CAPS capsule Take 1 capsule (0.4 mg total) by mouth daily after supper. 12/24/22   McKenzie, Arden Beck, MD  zolpidem  (AMBIEN ) 10 MG tablet Take 10 mg by mouth at bedtime as needed for sleep.    [provider]    Physical Exam: Vitals:   07/08/23 1348 07/08/23 1352  07/08/23 1419 07/08/23 1426  BP:   130/89 (!) 130/90  Pulse: 85  86   Resp: 16     Temp:  98.5 F (36.9 C) 98.5 F (36.9 C)   TempSrc:  Oral Oral   SpO2: 100%  100%   Weight:   97 kg     Constitutional: NAD, calm, comfortable Vitals:   07/08/23 1348 07/08/23 1352 07/08/23 1419 07/08/23 1426  BP:   130/89 (!) 130/90  Pulse: 85  86   Resp: 16     Temp:  98.5 F (36.9 C) 98.5 F (36.9 C)   TempSrc:  Oral Oral   SpO2: 100%  100%   Weight:   97 kg    Eyes: lids and conjunctivae normal Neck: normal, supple Respiratory: Minimal wheezing and crackles bilaterally. Normal respiratory effort. No accessory muscle use.  Cardiovascular: Regular rate and rhythm, no murmurs. Abdomen: no tenderness, no distention. Bowel sounds positive.  Musculoskeletal: Mild bilateral edema. Skin: no rashes, lesions, ulcers.  Psychiatric: Flat affect  Labs on Admission: I have personally reviewed following labs and imaging studies  CBC: Recent Labs  Lab 07/02/23 0623 07/03/23 0845 07/08/23 1130  WBC 11.5* 9.8 5.8  HGB 8.4* 7.8* 8.5*  HCT 25.5* 23.9* 25.8*  MCV 90.1 90.2 93.1  PLT 261 252 281   Basic Metabolic Panel: Recent Labs  Lab 07/02/23 0623 07/03/23 0845 07/08/23 1130  NA 133* 134* 129*  K 3.8 3.8 3.8  CL 91* 95* 88*  CO2 24 25 22   GLUCOSE 112* 90 120*  BUN 57* 60* 51*  CREATININE 9.38* 10.66* 13.13*  CALCIUM  8.3* 7.9* 7.9*  MG  --  2.1  --   PHOS 6.0* 6.7*  --    GFR: Estimated Creatinine Clearance: 5.1 mL/min (A) (by C-G formula based on SCr of 13.13 mg/dL (H)). Liver Function Tests: Recent Labs  Lab 07/02/23 0623 07/03/23 0845 07/08/23 1130  AST  --   --  17  ALT  --   --  6  ALKPHOS  --   --  97  BILITOT  --   --  0.8  PROT  --   --  5.9*  ALBUMIN  2.4* 2.1* 2.6*   No results for input(s): "LIPASE", "AMYLASE" in the last 168 hours. No results for input(s): "AMMONIA" in the last 168 hours. Coagulation Profile: No results for input(s): "INR", "PROTIME" in the  last 168 hours. Cardiac Enzymes: No results for input(s): "CKTOTAL", "CKMB", "CKMBINDEX", "TROPONINI" in the last 168 hours. BNP (last 3 results) No results for input(s): "PROBNP" in the last 8760 hours. HbA1C: No results for input(s): "HGBA1C" in the last 72 hours. CBG: No results for input(s): "GLUCAP" in the last 168 hours. Lipid Profile: No results for input(s): "CHOL", "HDL", "LDLCALC", "TRIG", "CHOLHDL", "LDLDIRECT" in the last 72 hours. Thyroid  Function Tests: No results for input(s): "TSH", "T4TOTAL", "FREET4", "T3FREE", "THYROIDAB" in the last 72 hours. Anemia Panel: No results for input(s): "VITAMINB12", "FOLATE", "FERRITIN", "TIBC", "IRON", "RETICCTPCT" in the last 72 hours. Urine analysis:    Component Value Date/Time   COLORURINE YELLOW 06/10/2023 1007   APPEARANCEUR CLEAR 06/10/2023 1007   APPEARANCEUR Clear 10/29/2022 0906   LABSPEC 1.006 06/10/2023 1007   PHURINE 7.0 06/10/2023 1007   GLUCOSEU NEGATIVE 06/10/2023 1007   HGBUR NEGATIVE 06/10/2023 1007   BILIRUBINUR NEGATIVE 06/10/2023 1007   BILIRUBINUR Negative 10/29/2022 0906   KETONESUR NEGATIVE 06/10/2023 1007   PROTEINUR 100 (A) 06/10/2023 1007   UROBILINOGEN 0.2 05/17/2019 0917   NITRITE NEGATIVE 06/10/2023 1007   LEUKOCYTESUR NEGATIVE 06/10/2023 1007    Radiological Exams on Admission: No results found.  EKG: Independently reviewed.  A-fib 86 bpm.  Assessment/Plan Principal Problem:   Dyspnea and respiratory abnormalities Active Problems:   Essential hypertension   Anemia   Atherosclerosis of coronary artery without angina pectoris   Benign prostatic hyperplasia   AAA (abdominal aortic aneurysm) (HCC)   Atrial fibrillation with controlled ventricular response (HCC)   Acute congestive heart failure (HCC)   DNR (do not resuscitate)    Acute on chronic HFmrEF in the setting of volume overload with missed hemodialysis - Dialysis as below - Spouse states that she requires some assistance with  transport to hemodialysis and refuses to go to SNF - Currently on room air and without any significant tachypnea  ESRD on HD - Continue on dialysis per nephrology with plans to dialyze today and tomorrow  Paroxysmal atrial fibrillation - Not  on anticoagulation due to bleeding risk - Monitor on telemetry - Continue Toprol -XL and home amiodarone   History of CAD/CABG/chronic LBBB - Continue Plavix  and statin - Plan to keep hemoglobin greater than 8 and monitor  AAA with retroperitoneal hematoma status post EVAR on 5/17 - Performed by Dr. Fulton Job - Continue Plavix  and statin - Aspirin  discontinued indefinitely  Severe dementia - Noted to be pleasantly confused - Patient and family refused SNF despite PT recommendations on recent admission  Left renal lesion - Follow-up CT/MRI outpatient  History of prostate cancer/BPH - Status post seed implantation, follows with Dr. Claretta Croft - Continue Flomax   Physical deconditioning - PT/OT evaluation  Dyslipidemia - Continue statin   DVT prophylaxis: SCDs Code Status: DNR Family Communication: Discussed with spouse 5/28 Disposition Plan: Admit for hemodialysis Consults called:Nephrology Admission status: Observation, telemetry  Severity of Illness: The appropriate patient status for this patient is OBSERVATION. Observation status is judged to be reasonable and necessary in order to provide the required intensity of service to ensure the patient's safety. The patient's presenting symptoms, physical exam findings, and initial radiographic and laboratory data in the context of their medical condition is felt to place them at decreased risk for further clinical deterioration. Furthermore, it is anticipated that the patient will be medically stable for discharge from the hospital within 2 midnights of admission.    Marni Franzoni D Mason Sole DO [facility]  If 7PM-7AM, please contact night-coverage [email or web address]  07/08/2023, 2:34 PM

## 2023-07-08 NOTE — Patient Outreach (Signed)
 Collaboration with GI, [facility] TOC RN CM  RN CM received a return call from GI who discussed patient's response to planned inpatient GI treatments. The patient was confirmed to decline services needed to find the source of his bleed to help determine how to resolve it. The wife was reported to be present at the time of the patient declined services.   [name] will be able to follow this patient only if he has received dialysis & is inpatient at this time and is willing to assist as needed. Please refer to Dr Milus Alpha notes  GI encouraged patient to seek Emergency services to include CBC, CT/scope. GI also stated the black tarry stools may also be a result of Pepto RN CM also collaborated with [facility] TOC RN CM, [name] who made an attempt to reach the patient & wife this morning. Wife informed TOC RN CM she could not speak at this time as she was preparing to take the patient to HD.    Plan RN CM will outreach to wife & patient, if not able to get to [facility] today will recommend going to the ED for dialysis, swollen legs with pain & evaluation of black stools Begin discussion of palliative/hospice care with patient & wife for goals of care

## 2023-07-08 NOTE — Progress Notes (Signed)
 Pt not tolerated HD treatment well. He tried taking HD needle out  by himself. He said that: " let me get out of here and put an end to all this." Dr. Cindra Cree to reported , and the doctor consented.   07/08/23 1630  Vitals  Temp 98.4 F (36.9 C)  Temp Source Oral  BP 131/62  BP Location Right Arm  BP Method Automatic  Patient Position (if appropriate) Lying  Pulse Rate 90  Resp 20  Oxygen Therapy  SpO2 100 %  O2 Device Room Air  Post Treatment  Dialyzer Clearance Lightly streaked  Hemodialysis Intake (mL) 0 mL  Liters Processed 40  Fluid Removed (mL) 1500 mL  Tolerated HD Treatment No (Comment)  Post-Hemodialysis Comments see note.  AVG/AVF Arterial Site Held (minutes) 10 minutes  AVG/AVF Venous Site Held (minutes) 10 minutes  Fistula / Graft Left Forearm Arteriovenous fistula  No placement date or time found.   Placed prior to admission: Yes  Orientation: Left  Access Location: Forearm  Access Type: Arteriovenous fistula  Site Condition No complications  Fistula / Graft Assessment Present;Thrill;Bruit  Status Deaccessed  Needle Size 15  Drainage Description None

## 2023-07-08 NOTE — ED Notes (Signed)
 ED Provider at bedside.

## 2023-07-08 NOTE — Progress Notes (Addendum)
 BRIEF NEPHROLOGY NOTE  Patient recently discharged and known to our service but follows at [facility].  Has not gone to dialysis since discharge which was last Friday. He gets HD 2x weekly.  Weak.  Planning for admission.  On room air.  Labs acceptable.  Volume overload per ER.  Will plan to do dialysis today and reassess for needs of further HD tomorrow. Will formally consult when we seen him tomorrow.  Addendum Patient requested to come off dialysis after one hour. We told him this is not what he should do. Will carry on with dialysis.   Addendum: patient tried to come off HD after one hour. I talked to him on the phone and told him not to. He then tried to pull his needle out himself. We thus have to take him off the machine. I talked with his wife and said we will not be doing dialysis again unless his wife sits there with him. She said he will likely do the same thing. I recommended palliative care and she was against this but I told her we don't have other options if he is making himself unsafe on dialysis and not completing our treatments. She said she understands and I will consult them

## 2023-07-08 NOTE — Patient Instructions (Signed)
 Visit Information  Thank you for taking time to visit with me today. Please don't hesitate to contact me if I can be of assistance to you before our next scheduled appointment.  Your next care management appointment is by telephone on 07/10/23 at 1:45 pm  Please continue to consider the benefits of palliative care consult  Please call the care guide team at [phone] if you need to cancel, schedule, or reschedule an appointment.   Please call the Suicide and Crisis Lifeline: 988 call the [place]  National Suicide Prevention Lifeline: 531-435-3371 or TTY: 573-381-2949 TTY 530-796-9503) to talk to a trained counselor call 1-800-273-TALK (toll free, 24 hour hotline) call the [facility]: (270)[phone] call 911 if you are experiencing a Mental Health or Behavioral Health Crisis or need someone to talk to.  Sela Falk L. Mcarthur Speedy, RN, BSN, CCM [place]  [facility], [facility] RN Care Manager Direct Dial: [phone]  Fax: [phone]

## 2023-07-08 NOTE — ED Provider Notes (Signed)
 [facility] KIDNEY DIALYSIS UNIT Provider Note  CSN: [id number] Arrival date & time: 07/08/23 1107  Chief Complaint(s) Rectal Bleeding  HPI Ronald [name] is a 83 y.o. male history of CHF, end-stage renal disease, AAA status post repair recently with retroperitoneal hemorrhage, recent admission for upper GI bleeding presenting with fatigue and weakness.  Triage complaint reports black tarry stools however on clarification with the patient's wife he has not had any recent black tarry stools.  He has not had any since discharge from the hospital.  However, since discharge she has been very very weak, too weak to get up and go to dialysis.  No fevers or chills, nausea or vomiting, diarrhea, cough, runny nose, sore throat, rash.  He has had some leg swelling, increased shortness of breath, orthopnea at home.  She was not able to get him to dialysis on Monday.  She called EMS to bring him to the hospital since he is so weak.   Past Medical History Past Medical History:  Diagnosis Date   CHF (congestive heart failure) (HCC)    ESRD on hemodialysis (HCC)    GERD (gastroesophageal reflux disease)    Glaucoma    Hypertension    Macular degeneration    Myocardial infarction [facility])    1999   Prostate cancer (HCC) 02/2018   Stroke (HCC)    no deficits; had stroke while trying to place "stents in legs".   Patient Active Problem List   Diagnosis Date Noted   Dyspnea and respiratory abnormalities 07/08/2023   Advance care planning 06/29/2023   DNR (do not resuscitate) 06/29/2023   Hyperkalemia 06/29/2023   Ischemic colitis (HCC) 06/29/2023   Upper GI bleed 06/28/2023   Status post AAA (abdominal aortic aneurysm) repair 06/22/2023   Acute anemia 06/22/2023   Nonrheumatic aortic valve stenosis 06/22/2023   Atrial fibrillation with controlled ventricular response (HCC) 06/20/2023   Persistent atrial fibrillation (HCC) 06/18/2023   AAA (abdominal aortic aneurysm) (HCC) 06/17/2023    Moderate aortic valve stenosis 04/29/2023   Edema of lower extremity 12/18/2022   Tobacco dependence due to cigarettes 11/27/2022   Acute non-ST segment elevation myocardial infarction (HCC) 11/27/2022   Chronic obstructive pulmonary disease (HCC) 11/27/2022   Acute congestive heart failure (HCC) 11/27/2022   Paroxysmal A-fib (HCC) 11/12/2022   NSTEMI (non-ST elevated myocardial infarction) (HCC) 11/12/2022   Hx of CABG 11/12/2022   Chronic congestive heart failure (HCC) 11/12/2022   Tobacco abuse 11/12/2022   Skin lesion 04/09/2022   Age-related macular degeneration 04/09/2022   Anemia 04/09/2022   Benign prostatic hyperplasia 04/09/2022   Glaucoma 04/09/2022   Minimal cognitive impairment 07/05/2021   End-stage renal disease on hemodialysis (HCC) 03/14/2021   Secondary hyperparathyroidism (HCC) 03/14/2021   Abdominal aortic aneurysm (HCC) 03/14/2021   Atherosclerosis of coronary artery without angina pectoris 03/14/2021   Chronic insomnia 03/14/2021   Chronic low back pain 03/14/2021   Diarrhea 03/14/2021   Gastroesophageal reflux disease without esophagitis 03/14/2021   Mixed hyperlipidemia 03/14/2021   Gross hematuria 04/13/2020   Acute prostatitis 04/13/2020   Essential hypertension 05/04/2019   Chronic kidney disease, stage IV (severe) (HCC) 05/04/2019   Primary malignant neoplasm of prostate (HCC) 11/10/2017   Home Medication(s) Prior to Admission medications   Medication Sig Start Date End Date Taking? Authorizing Provider  amiodarone  (PACERONE ) 200 MG tablet Take 1 tablet (200 mg total) by mouth daily. 07/03/23   Gonfa, Taye T, MD  atorvastatin  (LIPITOR) 80 MG tablet Take 80 mg by mouth  in the morning. 07/05/19   [provider]  calcium  acetate (PHOSLO ) 667 MG capsule Take 1,334 mg by mouth 3 (three) times daily. citracal    [provider]  clopidogrel  (PLAVIX ) 75 MG tablet Take 1 tablet (75 mg total) by mouth daily. 06/23/23   Baglia, Corrina, PA-C   metoprolol  succinate (TOPROL  XL) 50 MG 24 hr tablet Take 1 tablet (50 mg total) by mouth daily. Take with or immediately following a meal. 06/23/23 06/22/24  Baglia, Corrina, PA-C  mirtazapine  (REMERON ) 15 MG tablet Take 15 mg by mouth at bedtime. 05/02/23   [provider]  pantoprazole  (PROTONIX ) 40 MG tablet Take 1 tablet (40 mg total) by mouth 2 (two) times daily for 60 days, THEN 1 tablet (40 mg total) daily. 07/02/23 09/30/23  Gonfa, Taye T, MD  polyethylene glycol powder (GLYCOLAX /MIRALAX ) 17 GM/SCOOP powder Take 17 g by mouth 2 (two) times daily as needed for mild constipation. Patient not taking: Reported on 07/07/2023 07/03/23   Fonnie Iba I, MD  tamsulosin  (FLOMAX ) 0.4 MG CAPS capsule Take 1 capsule (0.4 mg total) by mouth daily after supper. 12/24/22   McKenzie, Arden Beck, MD  zolpidem  (AMBIEN ) 10 MG tablet Take 10 mg by mouth at bedtime as needed for sleep.    [provider]                                                                                                                                    Past Surgical History Past Surgical History:  Procedure Laterality Date   ABDOMINAL AORTIC ANEURYSM REPAIR  2012   IN [place]    ABDOMINAL AORTIC ENDOVASCULAR STENT GRAFT N/A 06/17/2023   Procedure: INSERTION, ENDOVASCULAR STENT GRAFT, AORTA, ABDOMINAL;  Surgeon: Young Hensen, MD;  Location: [facility] OR;  Service: Vascular;  Laterality: N/A;   APPENDECTOMY     AV FISTULA PLACEMENT Left 10/16/2020   Procedure: LEFT ARM ARTERIOVENOUS (AV) FISTULA CREATION;  Surgeon: Mayo Speck, MD;  Location: [facility] ORS;  Service: Vascular;  Laterality: Left;   CORONARY ARTERY BYPASS GRAFT  01/1998   3 vessels 18 years ago   ENDARTERECTOMY FEMORAL Right 06/17/2023   Procedure: ENDARTERECTOMY, COMMON FEMORAL AND SUPERFICIAL FEMORAL;  Surgeon: Young Hensen, MD;  Location: [facility] OR;  Service: Vascular;  Laterality: Right;   INGUINAL HERNIA REPAIR Right 09/20/2015   Procedure:  REPAIR RIGHT INGUINAL HERNIA  WITH MESH;  Surgeon: Franki Isles, MD;  Location: [facility] ORS;  Service: General;  Laterality: Right;   INSERTION OF DIALYSIS CATHETER Right 08/02/2020   Procedure: INSERTION OF RIGHT TUNNELED INTERNAL JUGULAR  DIALYSIS CATHETER;  Surgeon: Mayo Speck, MD;  Location: [facility] ORS;  Service: Vascular;  Laterality: Right;   INSERTION OF ILIAC STENT Right 06/17/2023   Procedure: INSERTION, STENT, ARTERY, ILIAC;  Surgeon: Young Hensen, MD;  Location: [facility] OR;  Service: Vascular;  Laterality: Right;   LOWER EXTREMITY ANGIOGRAM  Right 06/17/2023   Procedure: [employer], LOWER EXTREMITY;  Surgeon: Young Hensen, MD;  Location: [facility] OR;  Service: Vascular;  Laterality: Right;   PATCH ANGIOPLASTY Right 06/17/2023   Procedure: ANGIOPLASTY, USING PATCH GRAFT USING Angelyn Barges;  Surgeon: Young Hensen, MD;  Location: [facility] OR;  Service: Vascular;  Laterality: Right;   REMOVAL OF A DIALYSIS CATHETER N/A 03/19/2021   Procedure: MINOR REMOVAL OF A TUNNELED DIALYSIS CATHETER;  Surgeon: Mayo Speck, MD;  Location: [facility]S;  Service: Vascular;  Laterality: N/A;   tumor removal from bladder     ULTRASOUND GUIDANCE FOR VASCULAR ACCESS Bilateral 06/17/2023   Procedure: ULTRASOUND GUIDANCE, FOR VASCULAR ACCESS;  Surgeon: Young Hensen, MD;  Location: [facility] OR;  Service: Vascular;  Laterality: Bilateral;   Family History Family History  Problem Relation Age of Onset   Cancer Father     Social History Social History   Tobacco Use   Smoking status: Former    Current packs/day: 0.50    Average packs/day: 0.5 packs/day for 65.9 years (33.0 ttl pk-yrs)    Types: Cigarettes    Start date: 08/07/1957   Smokeless tobacco: Never   Tobacco comments:    on chantix now  Vaping Use   Vaping status: Never Used  Substance Use Topics   Alcohol use: No   Drug use: No   Allergies Patient has no known allergies.  Review of Systems Review of Systems  All other systems reviewed and  are negative.   Physical Exam Vital Signs  I have reviewed the triage vital signs BP (!) 130/90 (BP Location: Right Arm)   Pulse 86   Temp 98.5 F (36.9 C) (Oral)   Resp 16   Wt 97 kg   SpO2 100%   BMI 27.46 kg/m  Physical Exam Vitals and nursing note reviewed.  Constitutional:      General: He is not in acute distress.    Appearance: Normal appearance.  HENT:     Mouth/Throat:     Mouth: Mucous membranes are moist.  Eyes:     Conjunctiva/sclera: Conjunctivae normal.  Cardiovascular:     Rate and Rhythm: Normal rate and regular rhythm.  Pulmonary:     Effort: No respiratory distress.     Breath sounds: Rales (bibasilar) present.     Comments: Mild increased WOB and tachypnea Abdominal:     General: Abdomen is flat.     Palpations: Abdomen is soft.     Tenderness: There is no abdominal tenderness.  Genitourinary:    Comments: Chaperoned by RN, brown stool Musculoskeletal:     Right lower leg: Edema present.     Left lower leg: Edema present.  Skin:    General: Skin is warm and dry.     Capillary Refill: Capillary refill takes less than 2 seconds.     Comments: Healing bruise to the flank on right side  Neurological:     Mental Status: He is alert and oriented to person, place, and time. Mental status is at baseline.  Psychiatric:        Mood and Affect: Mood normal.        Behavior: Behavior normal.     ED Results and Treatments Labs (all labs ordered are listed, but only abnormal results are displayed) Labs Reviewed  COMPREHENSIVE METABOLIC PANEL WITH GFR - Abnormal; Notable for the following components:      Result Value   Sodium 129 (*)    Chloride 88 (*)  Glucose, Bld 120 (*)    BUN 51 (*)    Creatinine, Ser 13.13 (*)    Calcium  7.9 (*)    Total Protein 5.9 (*)    Albumin  2.6 (*)    GFR, Estimated 3 (*)    Anion gap 19 (*)    All other components within normal limits  CBC - Abnormal; Notable for the following components:   RBC 2.77 (*)     Hemoglobin 8.5 (*)    HCT 25.8 (*)    RDW 17.8 (*)    All other components within normal limits  POC OCCULT BLOOD, ED - Normal  TYPE AND SCREEN                                                                                                                          Radiology No results found.  Pertinent labs & imaging results that were available during my care of the patient were reviewed by me and considered in my medical decision making (see MDM for details).  Medications Ordered in ED Medications  Chlorhexidine  Gluconate Cloth 2 % PADS 6 each (has no administration in time range)                                                                                                                                     Procedures Procedures  (including critical care time)  Medical Decision Making / ED Course   MDM:  83 year old presenting with weakness and fatigue.  Patient overall well-appearing, does have some minimal increased work of breathing, mild tachypnea.  Exam notable for bibasilar crackles, lower extremity edema.  Suspect patient is experiencing volume overload related to missed dialysis session.  He has been so weak he has not been able to leave the house to go to dialysis session.  There is no clear acute issue that has caused increased weakness than baseline other than recent hospitalization and recent surgery.  Low concern for ongoing GI bleeding, patient has brown stool in rectal vault, wife reports no melena, hemoglobin is improved from discharge.  Suspect patient is probably deconditioned from recent hospitalizations.  He may ultimately need SNF placement.  Given his weakness, discussed with hospitalist Dr. Mason Sole who has admitted the patient.  Discussed with Dr. Cindra Cree with nephrology who will consult.      Additional history obtained: -Additional history obtained from ems and spouse -External records  from outside source obtained and reviewed including: Chart review  including previous notes, labs, imaging, consultation notes including prior notes    Lab Tests: -I ordered, reviewed, and interpreted labs.   The pertinent results include:   Labs Reviewed  COMPREHENSIVE METABOLIC PANEL WITH GFR - Abnormal; Notable for the following components:      Result Value   Sodium 129 (*)    Chloride 88 (*)    Glucose, Bld 120 (*)    BUN 51 (*)    Creatinine, Ser 13.13 (*)    Calcium  7.9 (*)    Total Protein 5.9 (*)    Albumin  2.6 (*)    GFR, Estimated 3 (*)    Anion gap 19 (*)    All other components within normal limits  CBC - Abnormal; Notable for the following components:   RBC 2.77 (*)    Hemoglobin 8.5 (*)    HCT 25.8 (*)    RDW 17.8 (*)    All other components within normal limits  POC OCCULT BLOOD, ED - Normal  TYPE AND SCREEN    Notable for chronic anemia, signs of CKD  EKG   EKG Interpretation Date/Time:    Ventricular Rate:    PR Interval:    QRS Duration:    QT Interval:    QTC Calculation:   R Axis:      Text Interpretation:           Imaging Studies ordered: I ordered imaging studies including CXR On my interpretation imaging demonstrates pulmonary edema  I independently visualized and interpreted imaging. I agree with the radiologist interpretation   Medicines ordered and prescription drug management: Meds ordered this encounter  Medications   Chlorhexidine  Gluconate Cloth 2 % PADS 6 each    -I have reviewed the patients home medicines and have made adjustments as needed   Consultations Obtained: I requested consultation with the nephrologist,  and discussed lab and imaging findings as well as pertinent plan - they recommend: admission   Co morbidities that complicate the patient evaluation  Past Medical History:  Diagnosis Date   CHF (congestive heart failure) (HCC)    ESRD on hemodialysis (HCC)    GERD (gastroesophageal reflux disease)    Glaucoma    Hypertension    Macular degeneration    Myocardial  infarction (HCC)    1999   Prostate cancer (HCC) 02/2018   Stroke (HCC)    no deficits; had stroke while trying to place "stents in legs".      Dispostion: Disposition decision including need for hospitalization was considered, and patient admitted to the hospital.    Final Clinical Impression(s) / ED Diagnoses Final diagnoses:  Hypervolemia associated with renal insufficiency     This chart was dictated using voice recognition software.  Despite best efforts to proofread,  errors can occur which can change the documentation meaning.    Mordecai Applebaum, MD 07/08/23 [phone]

## 2023-07-08 NOTE — Transitions of Care (Post Inpatient/ED Visit) (Signed)
 07/08/2023  Patient ID: Ronald [name], male   DOB: 1940-09-27, 83 y.o.   MRN: [id number]  Chart Review for transitions of care.  See [employer] for documentation.  Roneka Gilpin J. Crosby Oriordan RN, MSN [facility], [facility] RN Care Manager Direct Dial: [phone]  Fax: [phone] Website: [email or web address]

## 2023-07-08 NOTE — Progress Notes (Signed)
 Mobility Specialist Progress Note:    07/08/23 1645  Mobility  Activity Ambulated with assistance in room  Level of Assistance Standby assist, set-up cues, supervision of patient - no hands on  Assistive Device None  Distance Ambulated (ft) 10 ft  Range of Motion/Exercises Active;All extremities  Activity Response Tolerated well  Mobility Referral Yes  Mobility visit 1 Mobility  Mobility Specialist Start Time (ACUTE ONLY) 1635  Mobility Specialist Stop Time (ACUTE ONLY) 1645  Mobility Specialist Time Calculation (min) (ACUTE ONLY) 10 min   Assisted NT with ambulating pt to bed. Required SBA to stand and ambulate with no AD. Tolerated well, asx throughout. Left sitting EOB with nursing staff, all needs met.   Glinda Lapping Mobility Specialist Please contact via Special educational needs teacher or  Rehab office at [phone]

## 2023-07-08 NOTE — Patient Outreach (Signed)
 Complex Care Management   Visit Note  07/08/2023  Name:  Ronald [name] MRN: [id number] DOB: [birth year]  Situation: Referral received for Complex Care Management related to Acute MI I obtained verbal consent from Caregiver Patient.  Visit completed with Mr & Mrs Ronald [name]  on the phone  Background:   Past Medical History:  Diagnosis Date   CHF (congestive heart failure) (HCC)    ESRD on hemodialysis (HCC)    GERD (gastroesophageal reflux disease)    Glaucoma    Hypertension    Macular degeneration    Myocardial infarction [facility])    1999   Prostate cancer (HCC) 02/2018   Stroke (HCC)    no deficits; had stroke while trying to place "stents in legs".    Assessment: Patient Reported Symptoms:  Cognitive Cognitive Status: Struggling with memory recall, Confused or disoriented, Able to follow simple commands      Neurological Neurological Review of Symptoms: Weakness Neurological Management Strategies: Adequate rest, Routine screening Neurological Self-Management Outcome: 3 (uncertain) Neurological Comment: moderate cognitive impairment  HEENT HEENT Symptoms Reported: Not assessed      Cardiovascular Cardiovascular Symptoms Reported: Fatigue, Swelling in legs or feet Does patient have uncontrolled Hypertension?: No Cardiovascular Conditions: Dysrhythmia, Heart failure, Hypertension, Myocardial infarction, Coronary artery disease, High blood cholesterol Cardiovascular Management Strategies: Adequate rest, Medication therapy, Routine screening Cardiovascular Self-Management Outcome: 3 (uncertain)  Respiratory Respiratory Symptoms Reported: Wheezing, Shortness of breath Respiratory Conditions: COPD Respiratory Self-Management Outcome: 3 (uncertain)  Endocrine Patient reports the following symptoms related to hypoglycemia or hyperglycemia : Shortness of breath, Weakness or fatigue Is patient diabetic?: No Endocrine Conditions: Parathyroid disorder Endocrine  Management Strategies: Adequate rest, Fluid modification, Diet modification, Routine screening, Medication therapy Endocrine Self-Management Outcome: 3 (uncertain)  Gastrointestinal Gastrointestinal Symptoms Reported: Abdominal pain or discomfort, Diarrhea, Incontinence Other Gastrointestinal Symptoms: continues with loose dark tarry stools "like gel" per wife Wife confirmed he is not on Pepto (that may cuse this) Gastrointestinal Conditions: Abdominal pain, Bleeding, Diarrhea, Nausea, Reflux/heartburn Gastrointestinal Management Strategies: Adequate rest, Fluid modification, Incontinence garment/pad Gastrointestinal Self-Management Outcome: 2 (bad) Nutrition Risk Screen (CP): No indicators present  Genitourinary Genitourinary Symptoms Reported: Other Other Genitourinary Symptoms: Has not been dialysis since 07/03/23. Not able to get out of the home to [facility] r/t weakness, pain of right leg, swollen legs with right most swollen. Difficulty sitting up for long periods of time Genitourinary Conditions: Chronic kidney disease, End-stage renal disease, Prostate cancer Genitourinary Management Strategies: Adequate rest, Diet modification, Fluid modification, Hemodialysis, Medical device, Medication therapy Hemodialysis Schedule: Mondays, wednesdays, Fridays [facility] Last Treatment: 07/03/23 Genitourinary Self-Management Outcome: 2 (bad)  Integumentary Integumentary Symptoms Reported: Incision Additional Integumentary Details: No redness, drainage but has swelling & pain Skin Conditions: Other Other Skin Conditions: skin leasion hx  Musculoskeletal Musculoskelatal Symptoms Reviewed: Difficulty walking, Unsteady gait, Weakness Additional Musculoskeletal Details: painof right leg, both legs swollen more swelling in right leg Musculoskeletal Conditions: Joint pain, Mobility limited, Unsteady gait Musculoskeletal Management Strategies: Adequate rest, Medical device, Medication  therapy, Routine screening Musculoskeletal Self-Management Outcome: 3 (uncertain) Falls in the past year?: No Number of falls in past year: 1 or less Was there an injury with Fall?: No Fall Risk Category Calculator: 0 Patient Fall Risk Level: Low Fall Risk Patient at Risk for Falls Due to: Impaired balance/gait, Impaired mobility, Impaired vision, Mental status change Fall risk Follow up: Falls evaluation completed  Psychosocial Psychosocial Symptoms Reported: Other Other Psychosocial Conditions: moderate cognitive impariment Behavioral Health Conditions: Other Other Behavorial Health  Conditions: moderate cognitive impariment Behavioral Management Strategies: Adequate rest, Medication therapy, Support system Behavioral Health Self-Management Outcome: 3 (uncertain) Major Change/Loss/Stressor/Fears (CP): Medical condition, self Techniques to Cope with Loss/Stress/Change: Medication Quality of Family Relationships: helpful, involved Do you feel physically threatened by others?: No      07/08/2023   11:53 AM  Depression screen PHQ 2/9  Decreased Interest 1  Down, Depressed, Hopeless 1  PHQ - 2 Score 2  Altered sleeping 1  Tired, decreased energy 1  Change in appetite 1  Feeling bad or failure about yourself  0  Trouble concentrating 1  Moving slowly or fidgety/restless 0  Suicidal thoughts 0  PHQ-9 Score 6  Difficult doing work/chores Somewhat difficult    There were no vitals filed for this visit.  Medications Reviewed Today   Medications were not reviewed in this encounter     Recommendation:   Specialty provider follow-up by [facility], Inpatient Dialysis Lab requests: ED labs prior to HD Diagnostic requests: ED as allowed for GI bleed, tarry stools Referral to: palliative care discussed & encouraged  Follow Up Plan:   Telephone follow up appointment date/time:  07/10/23 1:45 pm  Cheryll Keisler L. Mcarthur Speedy, RN, BSN, CCM [place]  [facility], [facility]  Health RN Care Manager Direct Dial: [phone]  Fax: [phone]

## 2023-07-09 ENCOUNTER — Observation Stay (HOSPITAL_COMMUNITY)

## 2023-07-09 ENCOUNTER — Encounter (HOSPITAL_COMMUNITY): Payer: Self-pay | Admitting: Internal Medicine

## 2023-07-09 DIAGNOSIS — R0989 Other specified symptoms and signs involving the circulatory and respiratory systems: Secondary | ICD-10-CM | POA: Diagnosis not present

## 2023-07-09 DIAGNOSIS — I509 Heart failure, unspecified: Secondary | ICD-10-CM

## 2023-07-09 DIAGNOSIS — Z515 Encounter for palliative care: Secondary | ICD-10-CM | POA: Diagnosis not present

## 2023-07-09 DIAGNOSIS — I132 Hypertensive heart and chronic kidney disease with heart failure and with stage 5 chronic kidney disease, or end stage renal disease: Secondary | ICD-10-CM | POA: Diagnosis not present

## 2023-07-09 DIAGNOSIS — R062 Wheezing: Secondary | ICD-10-CM | POA: Diagnosis not present

## 2023-07-09 DIAGNOSIS — R0602 Shortness of breath: Secondary | ICD-10-CM | POA: Diagnosis not present

## 2023-07-09 DIAGNOSIS — E8779 Other fluid overload: Secondary | ICD-10-CM | POA: Diagnosis not present

## 2023-07-09 DIAGNOSIS — N186 End stage renal disease: Secondary | ICD-10-CM | POA: Diagnosis not present

## 2023-07-09 DIAGNOSIS — R627 Adult failure to thrive: Secondary | ICD-10-CM | POA: Diagnosis not present

## 2023-07-09 DIAGNOSIS — Z7189 Other specified counseling: Secondary | ICD-10-CM

## 2023-07-09 DIAGNOSIS — N2581 Secondary hyperparathyroidism of renal origin: Secondary | ICD-10-CM | POA: Diagnosis not present

## 2023-07-09 DIAGNOSIS — R531 Weakness: Secondary | ICD-10-CM | POA: Diagnosis not present

## 2023-07-09 DIAGNOSIS — R0600 Dyspnea, unspecified: Secondary | ICD-10-CM | POA: Diagnosis not present

## 2023-07-09 DIAGNOSIS — Z992 Dependence on renal dialysis: Secondary | ICD-10-CM | POA: Diagnosis not present

## 2023-07-09 DIAGNOSIS — I5020 Unspecified systolic (congestive) heart failure: Secondary | ICD-10-CM | POA: Diagnosis not present

## 2023-07-09 DIAGNOSIS — R059 Cough, unspecified: Secondary | ICD-10-CM | POA: Diagnosis not present

## 2023-07-09 DIAGNOSIS — R0689 Other abnormalities of breathing: Secondary | ICD-10-CM | POA: Diagnosis not present

## 2023-07-09 DIAGNOSIS — D631 Anemia in chronic kidney disease: Secondary | ICD-10-CM | POA: Diagnosis not present

## 2023-07-09 LAB — BASIC METABOLIC PANEL WITH GFR
Anion gap: 13 (ref 5–15)
BUN: 43 mg/dL — ABNORMAL HIGH (ref 8–23)
CO2: 24 mmol/L (ref 22–32)
Calcium: 7.8 mg/dL — ABNORMAL LOW (ref 8.9–10.3)
Chloride: 94 mmol/L — ABNORMAL LOW (ref 98–111)
Creatinine, Ser: 11.1 mg/dL — ABNORMAL HIGH (ref 0.61–1.24)
GFR, Estimated: 4 mL/min — ABNORMAL LOW (ref >60)
Glucose, Bld: 105 mg/dL — ABNORMAL HIGH (ref 70–99)
Potassium: 3.9 mmol/L (ref 3.5–5.1)
Sodium: 131 mmol/L — ABNORMAL LOW (ref 135–145)

## 2023-07-09 LAB — CBC
HCT: 26 % — ABNORMAL LOW (ref 39.0–52.0)
Hemoglobin: 8.5 g/dL — ABNORMAL LOW (ref 13.0–17.0)
MCH: 30.7 pg (ref 26.0–34.0)
MCHC: 32.7 g/dL (ref 30.0–36.0)
MCV: 93.9 fL (ref 80.0–100.0)
Platelets: 247 10*3/uL (ref 150–400)
RBC: 2.77 MIL/uL — ABNORMAL LOW (ref 4.22–5.81)
RDW: 18.2 % — ABNORMAL HIGH (ref 11.5–15.5)
WBC: 5 10*3/uL (ref 4.0–10.5)
nRBC: 0 % (ref 0.0–0.2)

## 2023-07-09 LAB — BPAM RBC
Blood Product Expiration Date: 202506252359
Unit Type and Rh: 6200

## 2023-07-09 LAB — MAGNESIUM: Magnesium: 1.8 mg/dL (ref 1.7–2.4)

## 2023-07-09 MED ORDER — HYDROCODONE BIT-HOMATROP MBR 5-1.5 MG/5ML PO SOLN: 5.0000 mL | Freq: Four times a day (QID) | ORAL | Status: DC | PRN

## 2023-07-09 MED ORDER — OXYCODONE HCL 5 MG PO TABS
5.0000 mg | ORAL_TABLET | Freq: Once | ORAL | Status: AC | PRN
  Administered 2023-07-09: 5 mg via ORAL
  Filled 2023-07-09: qty 1

## 2023-07-09 MED ORDER — HALOPERIDOL LACTATE 5 MG/ML IJ SOLN: 1.0000 mg | Freq: Four times a day (QID) | INTRAMUSCULAR | Status: DC | PRN

## 2023-07-09 MED ORDER — HALOPERIDOL 0.5 MG PO TABS
1.0000 mg | ORAL_TABLET | Freq: Four times a day (QID) | ORAL | Status: DC | PRN
  Administered 2023-07-09 – 2023-07-10 (×3): 1 mg via ORAL
  Filled 2023-07-09 (×3): qty 2

## 2023-07-09 MED ORDER — TORSEMIDE 20 MG PO TABS
60.0000 mg | ORAL_TABLET | Freq: Every day | ORAL | Status: DC
  Administered 2023-07-09 – 2023-07-11 (×3): 60 mg via ORAL
  Filled 2023-07-09 (×3): qty 3

## 2023-07-09 MED ORDER — GUAIFENESIN-DM 100-10 MG/5ML PO SYRP
10.0000 mL | ORAL_SOLUTION | Freq: Four times a day (QID) | ORAL | Status: DC | PRN
  Administered 2023-07-09: 10 mL via ORAL
  Filled 2023-07-09: qty 10

## 2023-07-09 MED ORDER — BENZONATATE 100 MG PO CAPS
100.0000 mg | ORAL_CAPSULE | Freq: Three times a day (TID) | ORAL | Status: DC
  Administered 2023-07-09 – 2023-07-11 (×6): 100 mg via ORAL
  Filled 2023-07-09 (×6): qty 1

## 2023-07-09 NOTE — Care Management Obs Status (Signed)
 MEDICARE OBSERVATION STATUS NOTIFICATION   Patient Details  Name: Ronald [name] MRN: [id number] Date of Birth: 11-16-1940   Medicare Observation Status Notification Given:  Yes    Geraldina Klinefelter, RN 07/09/2023, 4:01 PM

## 2023-07-09 NOTE — Evaluation (Addendum)
 Physical Therapy Evaluation Patient Details Name: Ronald [name] MRN: [id number] DOB: [birth year] Today's Date: 07/09/2023  History of Present Illness  Ronald [name] is a 83 y.o. male with medical history significant for CAD/CABG, HFmrEF, ESRD on HD MF, CVA, HTN, neurocognitive disorder, A-fib not on [facility], AAA with retroperitoneal hematoma s/p EVAR on 5/17 by Dr. Fulton Job in recent discharge on 5/23 after hospitalization for ABLA and GI bleed.  At that time, he was transfused 3 units of PRBC and GI recommended continuing on Protonix  twice daily and he was resumed on Plavix  and no longer on aspirin .  He was discharged on Augmentin  for 2 more days to complete course of treatment for ischemic colitis.  He came back to the ED with worsening weakness and shortness of breath in the setting of missed hemodialysis this past Monday.  Spouse states that they have been having difficulty with transport to hemodialysis and they had refused SNF on prior admission.  He continues to have some ongoing dark stools and chronic abdominal pain.   Clinical Impression  Patient functioning near baseline for functional mobility and gait demonstrating good return for bed mobility, transfers and ambulating in room without loss of balance or need for an AD. Plan:  Patient discharged from physical therapy to care of nursing for ambulation daily as tolerated for length of stay.          If plan is discharge home, recommend the following: Help with stairs or ramp for entrance   Can travel by private vehicle        Equipment Recommendations None recommended by PT  Recommendations for Other Services       Functional Status Assessment Patient has had a recent decline in their functional status and/or demonstrates limited ability to make significant improvements in function in a reasonable and predictable amount of time     Precautions / Restrictions Precautions Precautions: Fall Recall of Precautions/Restrictions:  Intact Restrictions Weight Bearing Restrictions Per Provider Order: No      Mobility  Bed Mobility Overal bed mobility: Independent                  Transfers Overall transfer level: Independent                      Ambulation/Gait Ambulation/Gait assistance: Modified independent (Device/Increase time) Gait Distance (Feet): 25 Feet Assistive device: None Gait Pattern/deviations: WFL(Within Functional Limits) Gait velocity: slightly decreased     General Gait Details: demonstrates good return for ambulating in room without loss of balance or need for an AD  Stairs            Wheelchair Mobility     Tilt Bed    Modified Rankin (Stroke Patients Only)       Balance Overall balance assessment: No apparent balance deficits (not formally assessed)                                           Pertinent Vitals/Pain Pain Assessment Pain Assessment: No/denies pain    Home Living Family/patient expects to be discharged to:: Private residence Living Arrangements: Spouse/significant other Available Help at Discharge: Family;Available 24 hours/day Type of Home: House Home Access: Stairs to enter Entrance Stairs-Rails: Left Entrance Stairs-Number of Steps: 5   Home Layout: One level Home Equipment: Agricultural consultant (2 wheels);Cane - single point;Hand held shower head;Grab bars -  tub/shower Additional Comments: per chart; pt confirms living in the same place.    Prior Function Prior Level of Function : Independent/Modified Independent             Mobility Comments: Pt reports independent mobility without AD. ADLs Comments: Wife assists with LB dressing; but pt also reported he could don socks on his own. No other reports of assist.     Extremity/Trunk Assessment   Upper Extremity Assessment Upper Extremity Assessment: Defer to OT evaluation    Lower Extremity Assessment Lower Extremity Assessment: Overall WFL for tasks  assessed    Cervical / Trunk Assessment Cervical / Trunk Assessment: Kyphotic  Communication   Communication Communication: No apparent difficulties    Cognition Arousal: Alert Behavior During Therapy: WFL for tasks assessed/performed   PT - Cognitive impairments: History of cognitive impairments, Problem solving, Safety/Judgement, Sequencing, Initiation, Attention, Memory, Awareness                         Following commands: Intact Following commands impaired: Only follows one step commands consistently     Cueing Cueing Techniques: Verbal cues     General Comments      Exercises     Assessment/Plan    PT Assessment Patient does not need any further PT services  PT Problem List         PT Treatment Interventions      PT Goals (Current goals can be found in the Care Plan section)  Acute Rehab PT Goals Patient Stated Goal: return home PT Goal Formulation: With patient Time For Goal Achievement: 07/09/23 Potential to Achieve Goals: Good    Frequency       Co-evaluation PT/OT/SLP Co-Evaluation/Treatment: Yes Reason for Co-Treatment: To address functional/ADL transfers PT goals addressed during session: Mobility/safety with mobility;Balance         AM-PAC PT "6 Clicks" Mobility  Outcome Measure Help needed turning from your back to your side while in a flat bed without using bedrails?: None Help needed moving from lying on your back to sitting on the side of a flat bed without using bedrails?: None Help needed moving to and from a bed to a chair (including a wheelchair)?: None Help needed standing up from a chair using your arms (e.g., wheelchair or bedside chair)?: None Help needed to walk in hospital room?: None Help needed climbing 3-5 steps with a railing? : A Little 6 Click Score: 23    End of Session   Activity Tolerance: Patient tolerated treatment well;Patient limited by fatigue Patient left: in bed;with call bell/phone within  reach;with nursing/sitter in room Nurse Communication: Mobility status PT Visit Diagnosis: Unsteadiness on feet (R26.81);Other abnormalities of gait and mobility (R26.89);Muscle weakness (generalized) (M62.81)    Time: 5784-6962 PT Time Calculation (min) (ACUTE ONLY): 11 min   Charges:   PT Evaluation $PT Eval Low Complexity: 1 Low PT Treatments $Therapeutic Activity: 8-22 mins PT General Charges $$ ACUTE PT VISIT: 1 Visit         1:37 PM, 07/09/23 Walton Guppy, MPT Physical Therapist with [facility] [phone] office [phone] mobile phone

## 2023-07-09 NOTE — Progress Notes (Signed)
   07/09/23 1613  TOC Brief Assessment  Insurance and Status Reviewed  Patient has primary care physician Yes  Home environment has been reviewed From home c/wife  Prior level of function: Min to mod assist  Prior/Current Home Services No current home services  Social Drivers of Health Review SDOH reviewed no interventions necessary  Readmission risk has been reviewed Yes  Transition of care needs transition of care needs identified, TOC will continue to follow   Palliative consult completed and outpatient palliative recommended. Referral sent to [facility] via [facility].  Pt has been missing dialysis appts r/t transportation need. Wife is familiar c/[facility] and states that pt has been on a wait-list for eight months. TOC contacted [facility] and they were unaware that pt's insurance is Medicare/Medicaid dual enrollment. [facility] to contact [facility] and follow-up c/pt's wife.

## 2023-07-09 NOTE — Consult Note (Signed)
 Consultation Note Date: 07/09/2023   Patient Name: Ronald [name]  DOB: 10/18/1940  MRN: [id number]  Age / Sex: 83 y.o., male  PCP: Omie Bickers, MD Referring Physician: Cornelius Dill, DO  Reason for Consultation: Establishing goals of care  HPI/Patient Profile: 83 y.o. male  with past medical history of CAD/CABG, HFmrEF, ESRD on HD MF, CVA, HTN, neurocognitive disorder, A-fib not on [facility], AAA with retroperitoneal hematoma s/p EVAR on 5/17 by Dr. Fulton Job in recent discharge on 5/23 after hospitalization for ABLA and GI bleed admitted on 07/08/2023 with acute on chronic heart failure with dyspnea, ESRD on HD, dementia.   Clinical Assessment and Goals of Care: I have reviewed medical records including EPIC notes, labs and imaging, received report from RN, assessed the patient.  [name] is sitting up in bed.  He appears chronically ill.  He will make an somewhat keep eye contact.  He is able to tell me his name only as he has known dementia.  I do believe that he can make his basic needs known.  There is no family at bedside at this time.  A sitter is at bedside due to impulsivity, not aggression.  I offer [name] something to drink and to eat, he declines both. [name] tells me that he has been married to his wife for 57 years.  He shares that they have 2 children, [place] and [place].  Call to daughter, Rikki Che at [phone].   We have a detailed discussion about the chronic illness pathway, what is normal and expected.  We talked about heart failure, fluid overload, dialysis.  Holland Lundborg shares that her father stopped taking dialysis 3 times a week because he felt he was becoming weak and sick after sessions.  She shares that they also had some car trouble during this time.  We talked about CODE STATUS, outpatient palliative services.  Call to wife, [name], to discuss diagnosis  prognosis, GOC, EOL wishes, disposition and options.  I introduced Palliative Medicine as specialized medical care for people living with serious illness. It focuses on providing relief from the symptoms and stress of a serious illness. The goal is to improve quality of life for both the patient and the family.  We discussed a brief life review of the patient.  Mr. and Mrs. Creasman have been married for 57 years.  They are from [place].  They have 2 children [place], who lives in [place]  and [place].  We then focused on their current illness.  We talk in detail about [name]'s chronic illness burden and the treatment plan.  We talked about his inability to continue with dialysis at times.  Mrs. Arlester Ladd shares her main concern is her inability to get him to dialysis when he does not have the strength.  I shared that this is a sigh that he maybe is not able to continue dialysis for much longer, she agrees.  The natural disease trajectory and expectations at EOL were discussed.  Advanced directives, concepts specific to  code status, artifical feeding and hydration, and rehospitalization were considered and discussed.  DNR verified.  Hospice and Palliative Care services outpatient were explained and offered.  Palliative/hospice services discussed with both wife and daughter.  Both are agreeable.  Provider of choice [facility].   Discussed the importance of continued conversation with family and the medical providers regarding overall plan of care and treatment options, ensuring decisions are within the context of the patient's values and GOCs.  Questions and concerns were addressed.  The family was encouraged to call with questions or concerns.  PMT will continue to support holistically.  Conference with attending, bedside nursing staff, transition of care team related to patient condition, needs, outpatient palliative.   HCPOA NEXT OF KIN -wife    SUMMARY OF RECOMMENDATIONS   Continue to treat the  treatable but no CPR or intubation Outpatient palliative services with [facility]     Code Status/Advance Care Planning: DNR  Symptom Management:  Per hospitalist, no additional needs at this time. No morphine in ESRD  Palliative Prophylaxis:  Frequent Pain Assessment and Turn Reposition  Additional Recommendations (Limitations, Scope, Preferences): Continue to treatment no CPR or intubation anticipate relatively soon he will not be able to tolerate dialysis  Psycho-social/Spiritual:  Desire for further Chaplaincy support:no Additional Recommendations: Caregiving  Support/Resources and Education on Hospice  Prognosis:  < 3 months would be anticipated based on chronic illness burden, decreasing functional status, ESRD on HD but only goes 2 times per week  Discharge Planning: Home with outpatient palliative services with [facility]      Primary Diagnoses: Present on Admission:  Dyspnea and respiratory abnormalities  AAA (abdominal aortic aneurysm) (HCC)  Anemia  Atherosclerosis of coronary artery without angina pectoris  Atrial fibrillation with controlled ventricular response (HCC)  Benign prostatic hyperplasia  DNR (do not resuscitate)  Essential hypertension   I have reviewed the medical record, interviewed the patient and family, and examined the patient. The following aspects are pertinent.  Past Medical History:  Diagnosis Date   CHF (congestive heart failure) (HCC)    ESRD on hemodialysis (HCC)    GERD (gastroesophageal reflux disease)    Glaucoma    Hypertension    Macular degeneration    Myocardial infarction [facility])    1999   Prostate cancer (HCC) 02/2018   Stroke (HCC)    no deficits; had stroke while trying to place "stents in legs".   Social History   Socioeconomic History   Marital status: Married    Spouse name: Not on file   Number of children: Not on file   Years of education: Not on file   Highest education level: Not on file  Occupational  History   Not on file  Tobacco Use   Smoking status: Former    Current packs/day: 0.50    Average packs/day: 0.5 packs/day for 65.9 years (33.0 ttl pk-yrs)    Types: Cigarettes    Start date: 08/07/1957   Smokeless tobacco: Never   Tobacco comments:    on chantix now  Vaping Use   Vaping status: Never Used  Substance and Sexual Activity   Alcohol use: No   Drug use: No   Sexual activity: Yes  Other Topics Concern   Not on file  Social History Narrative   Right handed   Drinks caffeine   One story home   Social Drivers of Health   Financial Resource Strain: Low Risk  (07/07/2023)   Overall Financial Resource Strain (CARDIA)    Difficulty  of Paying Living Expenses: Not very hard  Food Insecurity: No Food Insecurity (07/07/2023)   Hunger Vital Sign    Worried About Running Out of Food in the Last Year: Never true    Ran Out of Food in the Last Year: Never true  Transportation Needs: No Transportation Needs (07/07/2023)   PRAPARE - Administrator, Civil Service (Medical): No    Lack of Transportation (Non-Medical): No  Physical Activity: Inactive (07/07/2023)   Exercise Vital Sign    Days of Exercise per Week: 0 days    Minutes of Exercise per Session: 0 min  Stress: No Stress Concern Present (07/07/2023)   [employer] of Occupational Health - Occupational Stress Questionnaire    Feeling of Stress : Only a little  Social Connections: Moderately Integrated (07/07/2023)   Social Connection and Isolation Panel [NHANES]    Frequency of Communication with Friends and Family: Twice a week    Frequency of Social Gatherings with Friends and Family: Twice a week    Attends Religious Services: 1 to 4 times per year    Active Member of [employer] or Organizations: No    Attends Engineer, structural: Never    Marital Status: Married   Family History  Problem Relation Age of Onset   Cancer Father    Scheduled Meds:  amiodarone   200 mg Oral Daily   atorvastatin    80 mg Oral q AM   calcium  acetate  1,334 mg Oral TID with meals   Chlorhexidine  Gluconate Cloth  6 each Topical Q0600   clopidogrel   75 mg Oral Daily   metoprolol  succinate  50 mg Oral Daily   mirtazapine   15 mg Oral QHS   pantoprazole   40 mg Oral BID   tamsulosin   0.4 mg Oral QPC supper   torsemide  60 mg Oral Daily   Continuous Infusions: PRN Meds:.acetaminophen  **OR** acetaminophen , ondansetron  **OR** ondansetron  (ZOFRAN ) IV, zolpidem  Medications Prior to Admission:  Prior to Admission medications   Medication Sig Start Date End Date Taking? Authorizing Provider  amiodarone  (PACERONE ) 200 MG tablet Take 1 tablet (200 mg total) by mouth daily. 07/03/23  Yes Gonfa, Taye T, MD  atorvastatin  (LIPITOR) 80 MG tablet Take 80 mg by mouth in the morning. 07/05/19  Yes [provider]  calcium  acetate (PHOSLO ) 667 MG capsule Take 1,334 mg by mouth 3 (three) times daily. citracal   Yes [provider]  clopidogrel  (PLAVIX ) 75 MG tablet Take 1 tablet (75 mg total) by mouth daily. 06/23/23  Yes Baglia, Corrina, PA-C  metoprolol  succinate (TOPROL  XL) 50 MG 24 hr tablet Take 1 tablet (50 mg total) by mouth daily. Take with or immediately following a meal. 06/23/23 06/22/24 Yes Baglia, Corrina, PA-C  mirtazapine  (REMERON ) 15 MG tablet Take 15 mg by mouth at bedtime. 05/02/23  Yes [provider]  pantoprazole  (PROTONIX ) 40 MG tablet Take 1 tablet (40 mg total) by mouth 2 (two) times daily for 60 days, THEN 1 tablet (40 mg total) daily. 07/02/23 09/30/23 Yes Gonfa, Taye T, MD  polyethylene glycol powder (GLYCOLAX /MIRALAX ) 17 GM/SCOOP powder Take 17 g by mouth 2 (two) times daily as needed for mild constipation. 07/03/23  Yes Doroteo Gasmen, MD  tamsulosin  (FLOMAX ) 0.4 MG CAPS capsule Take 1 capsule (0.4 mg total) by mouth daily after supper. 12/24/22  Yes McKenzie, Arden Beck, MD  zolpidem  (AMBIEN ) 10 MG tablet Take 10 mg by mouth at bedtime as needed for sleep.   Yes [provider]   No Known Allergies Review of Systems  Unable to perform ROS: Dementia    Physical Exam Vitals and nursing note reviewed.  Constitutional:      General: He is not in acute distress.    Appearance: He is ill-appearing.  Cardiovascular:     Rate and Rhythm: Normal rate.  Pulmonary:     Effort: Pulmonary effort is normal. No respiratory distress.  Skin:    General: Skin is warm and dry.  Neurological:     Mental Status: He is alert.     Comments: Known dementia   Psychiatric:     Comments: Sitter at bedside, impulsive      Vital Signs: BP 112/69 (BP Location: Right Arm)   Pulse 73   Temp 98.1 F (36.7 C)   Resp 20   Wt 95.5 kg   SpO2 98%   BMI 27.03 kg/m  Pain Scale: 0-10   Pain Score: 7    SpO2: SpO2: 98 % O2 Device:SpO2: 98 % O2 Flow Rate: .   IO: Intake/output summary:  Intake/Output Summary (Last 24 hours) at 07/09/2023 1010 Last data filed at 07/09/2023 0300 Gross per 24 hour  Intake 237 ml  Output 1500 ml  Net -1263 ml    LBM: Last BM Date : 07/08/23 Baseline Weight: Weight: 97 kg Most recent weight: Weight: 95.5 kg     Palliative Assessment/Data:     Time In: 1000    Time Out: 1115 Time Total: 75 minutes  Greater than 50%  of this time was spent counseling and coordinating care related to the above assessment and plan.  Signed by: Annabelle Barrack, NP   Please contact Palliative Medicine Team phone at [phone] for questions and concerns.  For individual provider: See Tilford Foley

## 2023-07-09 NOTE — Plan of Care (Signed)
   Problem: Activity: Goal: Risk for activity intolerance will decrease Outcome: Progressing   Problem: Coping: Goal: Level of anxiety will decrease Outcome: Progressing   Problem: Safety: Goal: Ability to remain free from injury will improve Outcome: Progressing

## 2023-07-09 NOTE — Progress Notes (Signed)
 Patient becoming agitated after wife left for the night. He is unable to rest, pulling at clothes and attempting to get out of bed. VSS. PRN PO Haldol  given.

## 2023-07-09 NOTE — Evaluation (Signed)
 Occupational Therapy Evaluation Patient Details Name: Ronald [name] MRN: [id number] DOB: 1940-05-01 Today's Date: 07/09/2023   History of Present Illness   Ronald [name] is a 83 y.o. male with medical history significant for CAD/CABG, HFmrEF, ESRD on HD MF, CVA, HTN, neurocognitive disorder, A-fib not on [facility], AAA with retroperitoneal hematoma s/p EVAR on 5/17 by Dr. Fulton Job in recent discharge on 5/23 after hospitalization for ABLA and GI bleed.  At that time, he was transfused 3 units of PRBC and GI recommended continuing on Protonix  twice daily and he was resumed on Plavix  and no longer on aspirin .  He was discharged on Augmentin for 2 more days to complete course of treatment for ischemic colitis.  He came back to the ED with worsening weakness and shortness of breath in the setting of missed hemodialysis this past Monday.  Spouse states that they have been having difficulty with transport to hemodialysis and they had refused SNF on prior admission.  He continues to have some ongoing dark stools and chronic abdominal pain. (per DO)     Clinical Impressions Pt agreeable to OT and PT co-evaluation. Pt reports independence with ambulation and most ADL's at baseline. Pt does not require physical assist for ADL's for mobility at this time. Supervision assist only due to history of dementia. Pt able to transfer without physical assist and don sock independently. Pt is not recommended for further acute OT services and will be discharged to care of nursing staff for remaining length of stay.      If plan is discharge home, recommend the following:   Direct supervision/assist for medications management;Assistance with cooking/housework     Functional Status Assessment   Patient has not had a recent decline in their functional status     Equipment Recommendations   None recommended by OT             Precautions/Restrictions   Precautions Precautions: Fall Recall of  Precautions/Restrictions: Impaired Restrictions Weight Bearing Restrictions Per Provider Order: No     Mobility Bed Mobility Overal bed mobility: Independent                  Transfers Overall transfer level: Independent (supervision only due to dementia; independent for physical assist.)                        Balance    Independent                                       ADL either performed or assessed with clinical judgement   ADL Overall ADL's : Needs assistance/impaired                                       General ADL Comments: Supervision for safety due to dementia, but no physical assist needed per observation and clinical judgement.     Vision Baseline Vision/History: 1 Wears glasses Ability to See in Adequate Light: 1 Impaired Patient Visual Report: No change from baseline Vision Assessment?: No apparent visual deficits     Perception Perception: Not tested       Praxis Praxis: Not tested       Pertinent Vitals/Pain Pain Assessment Pain Assessment: No/denies pain     Extremity/Trunk Assessment Upper Extremity Assessment Upper Extremity Assessment:  Overall [facility] for tasks assessed   Lower Extremity Assessment Lower Extremity Assessment: Defer to PT evaluation   Cervical / Trunk Assessment Cervical / Trunk Assessment: Kyphotic   Communication Communication Communication: No apparent difficulties   Cognition Arousal: Alert Behavior During Therapy: WFL for tasks assessed/performed Cognition: History of cognitive impairments             OT - Cognition Comments: No issues today; following commands well with convincing to get out of bed.                 Following commands: Intact Following commands impaired: Only follows one step commands consistently     Cueing  General Comments   Cueing Techniques: Verbal cues                 Home Living Family/patient expects to be  discharged to:: Private residence Living Arrangements: Spouse/significant other Available Help at Discharge: Family;Available 24 hours/day Type of Home: House Home Access: Stairs to enter [employer] of Steps: 5 Entrance Stairs-Rails: Left Home Layout: One level     Bathroom Shower/Tub: Producer, television/film/video: Handicapped height     Home Equipment: Agricultural consultant (2 wheels);Cane - single point;Hand held shower head;Grab bars - tub/shower   Additional Comments: per chart; pt confirms living in the same place.      Prior Functioning/Environment Prior Level of Function : Independent/Modified Independent             Mobility Comments: Pt reports independent mobility without AD. ADLs Comments: Wife assists with LB dressing; but pt also reported he could don socks on his own. No other reports of assist.                            Co-evaluation PT/OT/SLP Co-Evaluation/Treatment: Yes Reason for Co-Treatment: To address functional/ADL transfers   OT goals addressed during session: ADL's and self-care      AM-PAC OT "6 Clicks" Daily Activity     Outcome Measure Help from another person eating meals?: None Help from another person taking care of personal grooming?: None Help from another person toileting, which includes using toliet, bedpan, or urinal?: None Help from another person bathing (including washing, rinsing, drying)?: None Help from another person to put on and taking off regular upper body clothing?: None Help from another person to put on and taking off regular lower body clothing?: None 6 Click Score: 24   End of Session    Activity Tolerance: Patient tolerated treatment well Patient left: in bed;with call bell/phone within reach;with bed alarm set;with family/visitor present  OT Visit Diagnosis: Other symptoms and signs involving cognitive function;Unsteadiness on feet (R26.81)                Time: 1610-9604 OT Time  Calculation (min): 13 min Charges:  OT General Charges $OT Visit: 1 Visit OT Evaluation $OT Eval Low Complexity: 1 Low  Ronald [name] OT, MOT  Ronald [name] 07/09/2023, 9:22 AM

## 2023-07-09 NOTE — Discharge Instructions (Signed)
 [facility] [address] [place] [address] [phone]

## 2023-07-09 NOTE — Consult Note (Signed)
 ESRD Consult Note  Requesting provider: Doreene Gammon Service requesting consult: Hospitalist Reason for consult: ESRD, provision of dialysis Indication for acute dialysis?: End Stage Renal Disease  Outpatient dialysis unit: [facility] Outpatient dialysis prescription:  3 hours 15 minutes  EDW 91.5 kg  BF 300 ml/min DF 600 ml/min Monday and Friday  3K/2.5 ca Last post weight: 91.3 kg on 5/5 Meds: Mircera every 4 weeks but doesn't look like has received in the past several weeks due to being hospitalized per [facility] RN; Venofer  50 mg weekly No activated vit D No heparin    Assessment/Recommendations: Ronald [name] is a/an 83 y.o. male with a past medical history notable for ESRD on HD admitted with weakness  # ESRD: Dialysis M and F outpatient. Now with dementia significantly impacting his ability to receive dialysis safely as he tried to pull out his needles on 5/28. Will involve palliative care. If they want to continue with dialysis his wife will have to be at bedside during dialysis while here. I will also recommend she attend all outpatient dialysis sessions.Tentatively plan on HD tomorrow  # Volume/ hypertension: Appears overloaded due to chronic dialysis non-compliance. Plan for UF on HD tomorrow and start torsemide  60mg  daily  # Anemia of Chronic Kidney Disease: Hemoglobin 8.5. Will check iron  labs and consider ESA  # Secondary Hyperparathyroidism/Hyperphosphatemia: Continue home phsolo. Check Phos level. Ca corrects near normal  # Vascular access: AVF with no issues  # Weakness/Cognitive Impairment/FTT: overall significant decline.  Discussed with wife and she wants to continue with dialysis at this time.  I do recommend having palliative care involved.  Placed consult yesterday.  Continue conversations regarding goals of care.  The patient's noncompliance with dialysis as well as his dementia is a major issue  # Dementia: Making dialysis difficult.  Delirium  precautions.  Management per primary team  # Atrial fibrillation: On amiodarone  and metoprolol   # Additional recommendations: - Dose all meds for creatinine clearance < 10 ml/min  - Unless absolutely necessary, no MRIs with gadolinium.  - Implement save arm precautions.  Prefer needle sticks in the dorsum of the hands or wrists.  No blood pressure measurements in arm. - If blood transfusion is requested during hemodialysis sessions, please alert us  prior to the session.  - Use synthetic opioids (Fentanyl /Dilaudid ) if needed  Recommendations were discussed with the primary team.   History of Present Illness: Ronald [name] is a/an 83 y.o. male with a past medical history of ESRD who presents with weakness  Patient presented to the hospital yesterday with shortness of breath.  History is limited by the patient's cognitive impairment.  The patient was discharged from the hospital on 5/23 after he had a hospitalization for GI bleeding.  He was transfused and GI recommended medical management.  There was concern for ischemic colitis and he was treated with antibiotics.  However, after discharge the patient was too weak to go to dialysis so he was brought back by his wife.  She also notes some shortness of breath.  He did not go to dialysis on Monday.  Normally gets dialysis on Monday and Friday.  It is notable that the patient and their family refused to skilled nursing facility during the last hospitalization.  It is also notable that the patient was signing off of dialysis early at the last hospitalization.  Patient was noted to be volume overloaded in the emergency department but otherwise had no acute needs for dialysis.  Because of the volume excess  perform dialysis.  After about 1 hour the patient requested to come off.  I called and discussed with the patient the importance of staying on dialysis.  However, after this discussion patient attempted to remove his needles.  Therefore he was  taken off the machine for his own safety.  I called and discussed this concerning issue with the patient's wife.  I told her that she likely needs to be with him at dialysis in the future for his own safety.  She states he will likely do the same thing.  We discussed that he may be telling us  he does not want any more dialysis and we recommended palliative care.  This morning the patient says he feels well.  He is not oriented to place or time.  He states he does not remember trying to come off dialysis early yesterday.  He does not think he has any problems with dialysis and he is unable to voice his frustrations with dialysis.   Medications:  Current Facility-Administered Medications  Medication Dose Route Frequency Provider Last Rate Last Admin   acetaminophen  (TYLENOL ) tablet 650 mg  650 mg Oral Q6H PRN Shah, Pratik D, DO   650 mg at 07/09/23 9629   Or   acetaminophen  (TYLENOL ) suppository 650 mg  650 mg Rectal Q6H PRN Mason Sole, Pratik D, DO       amiodarone  (PACERONE ) tablet 200 mg  200 mg Oral Daily Mason Sole, Pratik D, DO       atorvastatin  (LIPITOR) tablet 80 mg  80 mg Oral q AM Mason Sole, Pratik D, DO       calcium  acetate (PHOSLO ) capsule 1,334 mg  1,334 mg Oral TID with meals Mason Sole, Pratik D, DO       Chlorhexidine  Gluconate Cloth 2 % PADS 6 each  6 each Topical Q0600 Doreene Gammon D, DO   6 each at 07/09/23 5284   clopidogrel  (PLAVIX ) tablet 75 mg  75 mg Oral Daily Shah, Pratik D, DO   75 mg at 07/08/23 1725   metoprolol  succinate (TOPROL -XL) 24 hr tablet 50 mg  50 mg Oral Daily Mason Sole, Pratik D, DO       mirtazapine  (REMERON ) tablet 15 mg  15 mg Oral QHS Shah, Pratik D, DO   15 mg at 07/08/23 2055   ondansetron  (ZOFRAN ) tablet 4 mg  4 mg Oral Q6H PRN Mason Sole, Pratik D, DO       Or   ondansetron  (ZOFRAN ) injection 4 mg  4 mg Intravenous Q6H PRN Mason Sole, Pratik D, DO       pantoprazole  (PROTONIX ) EC tablet 40 mg  40 mg Oral BID Mason Sole, Pratik D, DO   40 mg at 07/08/23 2056   tamsulosin  (FLOMAX ) capsule 0.4  mg  0.4 mg Oral QPC supper Shah, Pratik D, DO   0.4 mg at 07/08/23 1725   zolpidem  (AMBIEN ) tablet 5 mg  5 mg Oral QHS PRN Shah, Pratik D, DO   5 mg at 07/08/23 2056     ALLERGIES Patient has no known allergies.  MEDICAL HISTORY Past Medical History:  Diagnosis Date   CHF (congestive heart failure) (HCC)    ESRD on hemodialysis (HCC)    GERD (gastroesophageal reflux disease)    Glaucoma    Hypertension    Macular degeneration    Myocardial infarction [facility])    1999   Prostate cancer (HCC) 02/2018   Stroke (HCC)    no deficits; had stroke while trying to place "stents in legs".  SOCIAL HISTORY Social History   Socioeconomic History   Marital status: Married    Spouse name: Not on file   Number of children: Not on file   Years of education: Not on file   Highest education level: Not on file  Occupational History   Not on file  Tobacco Use   Smoking status: Former    Current packs/day: 0.50    Average packs/day: 0.5 packs/day for 65.9 years (33.0 ttl pk-yrs)    Types: Cigarettes    Start date: 08/07/1957   Smokeless tobacco: Never   Tobacco comments:    on chantix now  Vaping Use   Vaping status: Never Used  Substance and Sexual Activity   Alcohol use: No   Drug use: No   Sexual activity: Yes  Other Topics Concern   Not on file  Social History Narrative   Right handed   Drinks caffeine   One story home   Social Drivers of Health   Financial Resource Strain: Low Risk  (07/07/2023)   Overall Financial Resource Strain (CARDIA)    Difficulty of Paying Living Expenses: Not very hard  Food Insecurity: No Food Insecurity (07/07/2023)   Hunger Vital Sign    Worried About Running Out of Food in the Last Year: Never true    Ran Out of Food in the Last Year: Never true  Transportation Needs: No Transportation Needs (07/07/2023)   PRAPARE - Administrator, Civil Service (Medical): No    Lack of Transportation (Non-Medical): No  Physical Activity:  Inactive (07/07/2023)   Exercise Vital Sign    Days of Exercise per Week: 0 days    Minutes of Exercise per Session: 0 min  Stress: No Stress Concern Present (07/07/2023)   [employer] of Occupational Health - Occupational Stress Questionnaire    Feeling of Stress : Only a little  Social Connections: Moderately Integrated (07/07/2023)   Social Connection and Isolation Panel [NHANES]    Frequency of Communication with Friends and Family: Twice a week    Frequency of Social Gatherings with Friends and Family: Twice a week    Attends Religious Services: 1 to 4 times per year    Active Member of [employer] or Organizations: No    Attends Banker Meetings: Never    Marital Status: Married  Catering manager Violence: Not At Risk (07/07/2023)   Humiliation, Afraid, Rape, and Kick questionnaire    Fear of Current or Ex-Partner: No    Emotionally Abused: No    Physically Abused: No    Sexually Abused: No     FAMILY HISTORY Family History  Problem Relation Age of Onset   Cancer Father      Review of Systems: 12 systems were reviewed and negative except per HPI  Physical Exam: Vitals:   07/09/23 0211 07/09/23 0337  BP: (!) 142/84 112/69  Pulse: 89 73  Resp: (!) 22 20  Temp: 98.1 F (36.7 C) 98.1 F (36.7 C)  SpO2: 100% 98%   No intake/output data recorded.  Intake/Output Summary (Last 24 hours) at 07/09/2023 0836 Last data filed at 07/09/2023 0300 Gross per 24 hour  Intake 237 ml  Output 1500 ml  Net -1263 ml   General: well-appearing, no acute distress HEENT: anicteric sclera, MMM CV: normal rate, no murmurs, 2+ edema in the BLE Lungs: bilateral chest rise, normal wob Abd: soft, non-tender, non-distended Skin: no visible lesions or rashes Psych: alert, engaged, appropriate mood and affect Neuro: normal speech, oriented  to person but not place of time  Test Results Reviewed Lab Results  Component Value Date   NA 131 (L) 07/09/2023   K 3.9 07/09/2023    CL 94 (L) 07/09/2023   CO2 24 07/09/2023   BUN 43 (H) 07/09/2023   CREATININE 11.10 (H) 07/09/2023   CALCIUM  7.8 (L) 07/09/2023   ALBUMIN  2.6 (L) 07/08/2023   PHOS 6.7 (H) 07/03/2023    I have reviewed relevant outside healthcare records

## 2023-07-09 NOTE — Progress Notes (Signed)
 PROGRESS NOTE    Ronald [name]  YQM:[id number] DOB: Oct 14, 1940 DOA: 07/08/2023 PCP: Omie Bickers, MD   Brief Narrative:    Ronald [name] is a 83 y.o. male with medical history significant for CAD/CABG, HFmrEF, ESRD on HD MF, CVA, HTN, neurocognitive disorder, A-fib not on [facility], AAA with retroperitoneal hematoma s/p EVAR on 5/17 by Dr. Fulton Job in recent discharge on 5/23 after hospitalization for ABLA and GI bleed.  At that time, he was transfused 3 units of PRBC and GI recommended continuing on Protonix  twice daily and he was resumed on Plavix  and no longer on aspirin .  He was discharged on Augmentin for 2 more days to complete course of treatment for ischemic colitis.  He came back to the ED with worsening weakness and shortness of breath in the setting of missed hemodialysis this past Monday.  Spouse states that they have been having difficulty with transport to hemodialysis and they had refused SNF on prior admission.  He continues to have some ongoing dark stools and chronic abdominal pain.  He has undergone hemodialysis 5/28.  He is requiring sitter at bedside due to behavioral disturbances.  Palliative consultation pending.  Assessment & Plan:   Principal Problem:   Dyspnea and respiratory abnormalities Active Problems:   Essential hypertension   Anemia   Atherosclerosis of coronary artery without angina pectoris   Benign prostatic hyperplasia   AAA (abdominal aortic aneurysm) (HCC)   Atrial fibrillation with controlled ventricular response (HCC)   Acute congestive heart failure (HCC)   DNR (do not resuscitate)  Assessment and Plan:   Acute on chronic HFmrEF in the setting of volume overload with missed hemodialysis - Dialysis as below - Spouse states that she requires some assistance with transport to hemodialysis and refuses to go to SNF - Currently on room air and without any significant tachypnea -Appreciate palliative consultation   ESRD on HD - Continue on  dialysis per nephrology with plans to further dialyze tomorrow and spouse would have to be at bedside   Paroxysmal atrial fibrillation - Not on anticoagulation due to bleeding risk - Monitor on telemetry - Continue Toprol -XL and home amiodarone    History of CAD/CABG/chronic LBBB - Continue Plavix  and statin - Plan to keep hemoglobin greater than 8 and monitor   AAA with retroperitoneal hematoma status post EVAR on 5/17 - Performed by Dr. Fulton Job - Continue Plavix  and statin - Aspirin  discontinued indefinitely   Severe dementia - Noted to require sitter at bedside -Haldol  as needed with periods of agitation   Left renal lesion - Follow-up CT/MRI outpatient   History of prostate cancer/BPH - Status post seed implantation, follows with Dr. Claretta Croft - Continue Flomax    Physical deconditioning - PT/OT evaluation   Dyslipidemia - Continue statin    DVT prophylaxis: SCDs Code Status: DNR Family Communication: Discussed with daughter Rikki Che on phone 5/29 [phone] Disposition Plan:  Status is: Observation The patient will require care spanning > 2 midnights and should be moved to inpatient because: Behavioral disturbances with need for further monitoring.   Consultants:  Nephrology Palliative care  Procedures:  None  Antimicrobials:  None   Subjective: Patient seen and evaluated today with noted agitation and anxiety overnight requiring sitter at bedside.  He has undergone hemodialysis yesterday with no concerns.  Objective: Vitals:   07/08/23 2335 07/09/23 0211 07/09/23 0337 07/09/23 1144  BP: 129/70 (!) 142/84 112/69 128/70  Pulse: 86 89 73 91  Resp: (!) 22 (!) 22 20 19  Temp: 99 F (37.2 C) 98.1 F (36.7 C) 98.1 F (36.7 C) 99.3 F (37.4 C)  TempSrc: Oral Oral    SpO2: 97% 100% 98% 99%  Weight:        Intake/Output Summary (Last 24 hours) at 07/09/2023 1307 Last data filed at 07/09/2023 0800 Gross per 24 hour  Intake 357 ml  Output 1500 ml   Net -1143 ml   Filed Weights   07/08/23 1419 07/08/23 1632  Weight: 97 kg 95.5 kg    Examination:  General exam: Appears calm and comfortable  Respiratory system: Clear to auscultation. Respiratory effort normal. Cardiovascular system: S1 & S2 heard, RRR.  Gastrointestinal system: Abdomen is soft Central nervous system: Alert and awake Extremities: No edema Skin: No significant lesions noted Psychiatry: Flat affect.    Data Reviewed: I have personally reviewed following labs and imaging studies  CBC: Recent Labs  Lab 07/03/23 0845 07/08/23 1130 07/09/23 0422  WBC 9.8 5.8 5.0  HGB 7.8* 8.5* 8.5*  HCT 23.9* 25.8* 26.0*  MCV 90.2 93.1 93.9  PLT 252 281 247   Basic Metabolic Panel: Recent Labs  Lab 07/03/23 0845 07/08/23 1130 07/09/23 0422  NA 134* 129* 131*  K 3.8 3.8 3.9  CL 95* 88* 94*  CO2 25 22 24   GLUCOSE 90 120* 105*  BUN 60* 51* 43*  CREATININE 10.66* 13.13* 11.10*  CALCIUM  7.9* 7.9* 7.8*  MG 2.1  --  1.8  PHOS 6.7*  --   --    GFR: Estimated Creatinine Clearance: 6.1 mL/min (A) (by C-G formula based on SCr of 11.1 mg/dL (H)). Liver Function Tests: Recent Labs  Lab 07/03/23 0845 07/08/23 1130  AST  --  17  ALT  --  6  ALKPHOS  --  97  BILITOT  --  0.8  PROT  --  5.9*  ALBUMIN  2.1* 2.6*   No results for input(s): "LIPASE", "AMYLASE" in the last 168 hours. No results for input(s): "AMMONIA" in the last 168 hours. Coagulation Profile: No results for input(s): "INR", "PROTIME" in the last 168 hours. Cardiac Enzymes: No results for input(s): "CKTOTAL", "CKMB", "CKMBINDEX", "TROPONINI" in the last 168 hours. BNP (last 3 results) No results for input(s): "PROBNP" in the last 8760 hours. HbA1C: No results for input(s): "HGBA1C" in the last 72 hours. CBG: No results for input(s): "GLUCAP" in the last 168 hours. Lipid Profile: No results for input(s): "CHOL", "HDL", "LDLCALC", "TRIG", "CHOLHDL", "LDLDIRECT" in the last 72 hours. Thyroid   Function Tests: No results for input(s): "TSH", "T4TOTAL", "FREET4", "T3FREE", "THYROIDAB" in the last 72 hours. Anemia Panel: No results for input(s): "VITAMINB12", "FOLATE", "FERRITIN", "TIBC", "IRON ", "RETICCTPCT" in the last 72 hours. Sepsis Labs: No results for input(s): "PROCALCITON", "LATICACIDVEN" in the last 168 hours.  No results found for this or any previous visit (from the past 240 hours).       Radiology Studies: DG Chest Portable 1 View Result Date: 07/08/2023 CLINICAL DATA:  Shortness of breath EXAM: PORTABLE CHEST 1 VIEW COMPARISON:  11/12/2022 FINDINGS: Prior median sternotomy. Atherosclerotic calcification of the aortic arch. Upper normal heart size. Increased bilateral interstitial accentuation especially centrally with indistinct pulmonary vasculature suggesting worsening interstitial pulmonary edema given some faint scattered Kerley B lines. Atypical pneumonia is a differential diagnostic consideration. Trace fluid in the minor fissure. No blunting of the costophrenic angles. IMPRESSION: 1. Increased bilateral interstitial accentuation especially centrally with indistinct pulmonary vasculature suggesting worsening interstitial pulmonary edema. Atypical pneumonia is a differential diagnostic consideration. 2. Upper normal  heart size. 3. Prior median sternotomy. 4. Aortic Atherosclerosis (ICD10-I70.0). Electronically Signed   By: Freida Jes M.D.   On: 07/08/2023 15:02        Scheduled Meds:  amiodarone   200 mg Oral Daily   atorvastatin   80 mg Oral q AM   benzonatate   100 mg Oral TID   calcium  acetate  1,334 mg Oral TID with meals   Chlorhexidine  Gluconate Cloth  6 each Topical Q0600   clopidogrel   75 mg Oral Daily   metoprolol  succinate  50 mg Oral Daily   mirtazapine   15 mg Oral QHS   pantoprazole   40 mg Oral BID   tamsulosin   0.4 mg Oral QPC supper   torsemide   60 mg Oral Daily     LOS: 0 days    Time spent: 55 minutes    Kawthar Ennen Loran Rock,  DO [facility]  If 7PM-7AM, please contact night-coverage [email or web address] 07/09/2023, 1:07 PM

## 2023-07-10 ENCOUNTER — Telehealth: Payer: Self-pay | Admitting: *Deleted

## 2023-07-10 ENCOUNTER — Telehealth: Payer: Self-pay

## 2023-07-10 DIAGNOSIS — R0689 Other abnormalities of breathing: Secondary | ICD-10-CM | POA: Diagnosis not present

## 2023-07-10 DIAGNOSIS — H409 Unspecified glaucoma: Secondary | ICD-10-CM | POA: Diagnosis present

## 2023-07-10 DIAGNOSIS — I132 Hypertensive heart and chronic kidney disease with heart failure and with stage 5 chronic kidney disease, or end stage renal disease: Secondary | ICD-10-CM | POA: Diagnosis not present

## 2023-07-10 DIAGNOSIS — R627 Adult failure to thrive: Secondary | ICD-10-CM | POA: Diagnosis not present

## 2023-07-10 DIAGNOSIS — R0600 Dyspnea, unspecified: Secondary | ICD-10-CM | POA: Diagnosis not present

## 2023-07-10 DIAGNOSIS — F17210 Nicotine dependence, cigarettes, uncomplicated: Secondary | ICD-10-CM | POA: Diagnosis present

## 2023-07-10 DIAGNOSIS — D631 Anemia in chronic kidney disease: Secondary | ICD-10-CM | POA: Diagnosis not present

## 2023-07-10 DIAGNOSIS — E871 Hypo-osmolality and hyponatremia: Secondary | ICD-10-CM | POA: Diagnosis present

## 2023-07-10 DIAGNOSIS — I480 Paroxysmal atrial fibrillation: Secondary | ICD-10-CM | POA: Diagnosis present

## 2023-07-10 DIAGNOSIS — F03C11 Unspecified dementia, severe, with agitation: Secondary | ICD-10-CM | POA: Diagnosis present

## 2023-07-10 DIAGNOSIS — F03C4 Unspecified dementia, severe, with anxiety: Secondary | ICD-10-CM | POA: Diagnosis present

## 2023-07-10 DIAGNOSIS — E8779 Other fluid overload: Secondary | ICD-10-CM | POA: Diagnosis not present

## 2023-07-10 DIAGNOSIS — R109 Unspecified abdominal pain: Secondary | ICD-10-CM | POA: Diagnosis present

## 2023-07-10 DIAGNOSIS — Z992 Dependence on renal dialysis: Secondary | ICD-10-CM | POA: Diagnosis not present

## 2023-07-10 DIAGNOSIS — I5020 Unspecified systolic (congestive) heart failure: Secondary | ICD-10-CM | POA: Diagnosis not present

## 2023-07-10 DIAGNOSIS — G8929 Other chronic pain: Secondary | ICD-10-CM | POA: Diagnosis present

## 2023-07-10 DIAGNOSIS — Z515 Encounter for palliative care: Secondary | ICD-10-CM | POA: Diagnosis not present

## 2023-07-10 DIAGNOSIS — K219 Gastro-esophageal reflux disease without esophagitis: Secondary | ICD-10-CM | POA: Diagnosis present

## 2023-07-10 DIAGNOSIS — E785 Hyperlipidemia, unspecified: Secondary | ICD-10-CM | POA: Diagnosis present

## 2023-07-10 DIAGNOSIS — N186 End stage renal disease: Secondary | ICD-10-CM | POA: Diagnosis not present

## 2023-07-10 DIAGNOSIS — Z66 Do not resuscitate: Secondary | ICD-10-CM | POA: Diagnosis present

## 2023-07-10 DIAGNOSIS — E8770 Fluid overload, unspecified: Secondary | ICD-10-CM | POA: Diagnosis present

## 2023-07-10 DIAGNOSIS — R531 Weakness: Secondary | ICD-10-CM | POA: Diagnosis not present

## 2023-07-10 DIAGNOSIS — I5023 Acute on chronic systolic (congestive) heart failure: Secondary | ICD-10-CM | POA: Diagnosis present

## 2023-07-10 DIAGNOSIS — H3530 Unspecified macular degeneration: Secondary | ICD-10-CM | POA: Diagnosis present

## 2023-07-10 DIAGNOSIS — I714 Abdominal aortic aneurysm, without rupture, unspecified: Secondary | ICD-10-CM | POA: Diagnosis present

## 2023-07-10 DIAGNOSIS — Z1152 Encounter for screening for COVID-19: Secondary | ICD-10-CM | POA: Diagnosis not present

## 2023-07-10 DIAGNOSIS — N2581 Secondary hyperparathyroidism of renal origin: Secondary | ICD-10-CM | POA: Diagnosis not present

## 2023-07-10 LAB — BASIC METABOLIC PANEL WITH GFR
Anion gap: 16 — ABNORMAL HIGH (ref 5–15)
BUN: 62 mg/dL — ABNORMAL HIGH (ref 8–23)
CO2: 25 mmol/L (ref 22–32)
Calcium: 7.7 mg/dL — ABNORMAL LOW (ref 8.9–10.3)
Chloride: 91 mmol/L — ABNORMAL LOW (ref 98–111)
Creatinine, Ser: 13.45 mg/dL — ABNORMAL HIGH (ref 0.61–1.24)
GFR, Estimated: 3 mL/min — ABNORMAL LOW (ref >60)
Glucose, Bld: 100 mg/dL — ABNORMAL HIGH (ref 70–99)
Potassium: 4.5 mmol/L (ref 3.5–5.1)
Sodium: 132 mmol/L — ABNORMAL LOW (ref 135–145)

## 2023-07-10 LAB — CBC
HCT: 26.6 % — ABNORMAL LOW (ref 39.0–52.0)
Hemoglobin: 8.4 g/dL — ABNORMAL LOW (ref 13.0–17.0)
MCH: 30.1 pg (ref 26.0–34.0)
MCHC: 31.6 g/dL (ref 30.0–36.0)
MCV: 95.3 fL (ref 80.0–100.0)
Platelets: 234 10*3/uL (ref 150–400)
RBC: 2.79 MIL/uL — ABNORMAL LOW (ref 4.22–5.81)
RDW: 18.4 % — ABNORMAL HIGH (ref 11.5–15.5)
WBC: 5.4 10*3/uL (ref 4.0–10.5)
nRBC: 0 % (ref 0.0–0.2)

## 2023-07-10 LAB — RESP PANEL BY RT-PCR (RSV, FLU A&B, COVID)  RVPGX2
Influenza A by PCR: NEGATIVE
Influenza B by PCR: NEGATIVE
Resp Syncytial Virus by PCR: NEGATIVE
SARS Coronavirus 2 by RT PCR: NEGATIVE

## 2023-07-10 LAB — PHOSPHORUS: Phosphorus: 7.9 mg/dL — ABNORMAL HIGH (ref 2.5–4.6)

## 2023-07-10 LAB — MAGNESIUM: Magnesium: 2 mg/dL (ref 1.7–2.4)

## 2023-07-10 MED ORDER — IRON SUCROSE 500 MG IVPB - SIMPLE MED
500.0000 mg | Freq: Once | INTRAVENOUS | Status: DC
  Filled 2023-07-10: qty 275

## 2023-07-10 MED ORDER — PENTAFLUOROPROP-TETRAFLUOROETH EX AERO: 1.0000 | INHALATION_SPRAY | CUTANEOUS | Status: DC | PRN

## 2023-07-10 MED ORDER — LIDOCAINE HCL (PF) 1 % IJ SOLN: 5.0000 mL | INTRAMUSCULAR | Status: DC | PRN

## 2023-07-10 MED ORDER — LIDOCAINE-PRILOCAINE 2.5-2.5 % EX CREA: 1.0000 | TOPICAL_CREAM | CUTANEOUS | Status: DC | PRN

## 2023-07-10 MED ORDER — ALBUMIN HUMAN 25 % IV SOLN: 25.0000 g | INTRAVENOUS | Status: DC | PRN

## 2023-07-10 MED ORDER — SODIUM CHLORIDE 0.9 % IV SOLN
500.0000 mg | Freq: Once | INTRAVENOUS | Status: AC
  Administered 2023-07-10: 500 mg via INTRAVENOUS
  Filled 2023-07-10: qty 25

## 2023-07-10 MED ORDER — IPRATROPIUM-ALBUTEROL 0.5-2.5 (3) MG/3ML IN SOLN
3.0000 mL | Freq: Three times a day (TID) | RESPIRATORY_TRACT | Status: DC
  Filled 2023-07-10 (×2): qty 3

## 2023-07-10 MED ORDER — IPRATROPIUM-ALBUTEROL 0.5-2.5 (3) MG/3ML IN SOLN
3.0000 mL | Freq: Four times a day (QID) | RESPIRATORY_TRACT | Status: DC | PRN
  Administered 2023-07-10: 3 mL via RESPIRATORY_TRACT
  Filled 2023-07-10: qty 3

## 2023-07-10 MED ORDER — IPRATROPIUM-ALBUTEROL 0.5-2.5 (3) MG/3ML IN SOLN: 3.0000 mL | Freq: Three times a day (TID) | RESPIRATORY_TRACT | Status: DC

## 2023-07-10 NOTE — Progress Notes (Signed)
 Nephrology Follow-Up Consult note   Outpatient dialysis unit: [facility] dialysis prescription:  3 hours 15 minutes  EDW 91.5 kg  BF 300 ml/min DF 600 ml/min Monday and Friday  3K/2.5 ca Last post weight: 91.3 kg on 5/5 Meds: Mircera every 4 weeks but doesn't look like has received in the past several weeks due to being hospitalized per [facility] RN; Venofer 50 mg weekly No activated vit D No heparin     Assessment/Recommendations: Ronald [name] is a/an 83 y.o. male with a past medical history notable for ESRD on HD admitted with weakness   # ESRD: Dialysis M and F outpatient. Now with dementia significantly impacting his ability to receive dialysis safely as he tried to pull out his needles on 5/28.  Palliative care involved and appreciate help.  Plan for dialysis today and then reassess on Monday.  Requested that wife be present during dialysis because of the patient's dementia.  Recommend she be present in all outpatient dialysis sessions as well  # Volume/ hypertension: Appears overloaded due to chronic dialysis non-compliance. Plan for UF on HD also started torsemide 60 mg daily   # Anemia of Chronic Kidney Disease: Hemoglobin in 8s. Iron sat 21. Will dose iron 500mg  and consider ESA   # Secondary Hyperparathyroidism/Hyperphosphatemia: Continue home phsolo. Check Phos level. Ca corrects near normal   # Vascular access: AVF with no issues   # Weakness/Cognitive Impairment/FTT: overall significant decline.  Discussed with wife and she wants to continue with dialysis at this time.  Palliative care involved and appreciate help.   # Dementia: Making dialysis difficult.  Delirium precautions.  Management per primary team   # Atrial fibrillation: On amiodarone  and metoprolol    # Additional recommendations: - Dose all meds for creatinine clearance < 10 ml/min  - Unless absolutely necessary, no MRIs with gadolinium.  - Implement save arm precautions.  Prefer  needle sticks in the dorsum of the hands or wrists.  No blood pressure measurements in arm. - If blood transfusion is requested during hemodialysis sessions, please alert us  prior to the session.  - Use synthetic opioids (Fentanyl /Dilaudid ) if needed   Recommendations were discussed with the primary team.   Recommendations conveyed to primary service.    Levorn Reason [facility] 07/10/2023 10:57 AM  ___________________________________________________________  CC: Weakness   Interval History/Subjective: Patient remains confused today.  Likely at baseline.  Denies any complaints   Medications:  Current Facility-Administered Medications  Medication Dose Route Frequency Provider Last Rate Last Admin   acetaminophen  (TYLENOL ) tablet 650 mg  650 mg Oral Q6H PRN Shah, Pratik D, DO   650 mg at 07/10/23 4132   Or   acetaminophen  (TYLENOL ) suppository 650 mg  650 mg Rectal Q6H PRN Mason Sole, Pratik D, DO       amiodarone  (PACERONE ) tablet 200 mg  200 mg Oral Daily Mason Sole, Pratik D, DO   200 mg at 07/10/23 0831   atorvastatin  (LIPITOR) tablet 80 mg  80 mg Oral q AM Mason Sole, Pratik D, DO   80 mg at 07/10/23 0830   benzonatate (TESSALON) capsule 100 mg  100 mg Oral TID Shah, Pratik D, DO   100 mg at 07/10/23 0830   calcium  acetate (PHOSLO ) capsule 1,334 mg  1,334 mg Oral TID with meals Mason Sole, Pratik D, DO   1,334 mg at 07/10/23 4401   Chlorhexidine  Gluconate Cloth 2 % PADS 6 each  6 each Topical Q0600 Doreene Gammon D, DO   6 each  at 07/10/23 0548   clopidogrel  (PLAVIX ) tablet 75 mg  75 mg Oral Daily Doreene Gammon D, DO   75 mg at 07/10/23 1610   haloperidol  (HALDOL ) tablet 1 mg  1 mg Oral Q6H PRN Shah, Pratik D, DO   1 mg at 07/10/23 9604   Or   haloperidol  lactate (HALDOL ) injection 1 mg  1 mg Intramuscular Q6H PRN Mason Sole, Pratik D, DO       HYDROcodone bit-homatropine (HYCODAN) 5-1.5 MG/5ML syrup 5 mL  5 mL Oral Q6H PRN Mason Sole, Pratik D, DO       ipratropium-albuterol (DUONEB) 0.5-2.5 (3)  MG/3ML nebulizer solution 3 mL  3 mL Nebulization Q6H PRN Mason Sole, Pratik D, DO   3 mL at 07/10/23 5409   metoprolol  succinate (TOPROL -XL) 24 hr tablet 50 mg  50 mg Oral Daily Mason Sole, Pratik D, DO   50 mg at 07/09/23 8119   mirtazapine  (REMERON ) tablet 15 mg  15 mg Oral QHS Shah, Pratik D, DO   15 mg at 07/09/23 2203   ondansetron  (ZOFRAN ) tablet 4 mg  4 mg Oral Q6H PRN Mason Sole, Pratik D, DO       Or   ondansetron  (ZOFRAN ) injection 4 mg  4 mg Intravenous Q6H PRN Mason Sole, Pratik D, DO       pantoprazole  (PROTONIX ) EC tablet 40 mg  40 mg Oral BID Mason Sole, Pratik D, DO   40 mg at 07/10/23 1478   tamsulosin  (FLOMAX ) capsule 0.4 mg  0.4 mg Oral QPC supper Doreene Gammon D, DO   0.4 mg at 07/09/23 1623   torsemide (DEMADEX) tablet 60 mg  60 mg Oral Daily Levorn Reason, MD   60 mg at 07/10/23 0830   zolpidem  (AMBIEN ) tablet 5 mg  5 mg Oral QHS PRN Doreene Gammon D, DO   5 mg at 07/08/23 2056      Review of Systems: 10 systems reviewed and negative except per interval history/subjective  Physical Exam: Vitals:   07/10/23 0441 07/10/23 0835  BP: 123/70 (!) 123/54  Pulse: 71 95  Resp: 20   Temp: 98.6 F (37 C)   SpO2: 100%    Total I/O In: 480 [P.O.:480] Out: -   Intake/Output Summary (Last 24 hours) at 07/10/2023 1057 Last data filed at 07/10/2023 2956 Gross per 24 hour  Intake 960 ml  Output --  Net 960 ml   Constitutional: well-appearing, no acute distress ENMT: ears and nose without scars or lesions, MMM CV: normal rate, 2+ edema in the bilateral lower extremities Respiratory: Bilateral chest rise, normal work of breathing Gastrointestinal: soft, non-tender, no palpable masses or hernias Skin: no visible lesions or rashes Psych: Oriented to person but not place or time, mood and affect appropriate   Test Results I personally reviewed new and old clinical labs and radiology tests Lab Results  Component Value Date   NA 132 (L) 07/10/2023   K 4.5 07/10/2023   CL 91 (L) 07/10/2023   CO2  25 07/10/2023   BUN 62 (H) 07/10/2023   CREATININE 13.45 (H) 07/10/2023   CALCIUM  7.7 (L) 07/10/2023   ALBUMIN  2.6 (L) 07/08/2023   PHOS 6.7 (H) 07/03/2023    CBC Recent Labs  Lab 07/08/23 1130 07/09/23 0422 07/10/23 0759  WBC 5.8 5.0 5.4  HGB 8.5* 8.5* 8.4*  HCT 25.8* 26.0* 26.6*  MCV 93.1 93.9 95.3  PLT 281 247 234

## 2023-07-10 NOTE — Plan of Care (Signed)
  Problem: Clinical Measurements: Goal: Will remain free from infection Outcome: Progressing Goal: Cardiovascular complication will be avoided Outcome: Progressing   Problem: Activity: Goal: Risk for activity intolerance will decrease Outcome: Progressing   Problem: Nutrition: Goal: Adequate nutrition will be maintained Outcome: Progressing   Problem: Safety: Goal: Ability to remain free from injury will improve Outcome: Progressing

## 2023-07-10 NOTE — Transitions of Care (Post Inpatient/ED Visit) (Signed)
 07/10/2023  Patient ID: Ronald [name], male   DOB: 09-Apr-1940, 83 y.o.   MRN: [id number]  Chart Review for transitions of care.    Jairen Goldfarb J. Ysidra Sopher RN, MSN [facility], [facility] RN Care Manager Direct Dial: [phone]  Fax: [phone] Website: [email or web address]

## 2023-07-10 NOTE — TOC Progression Note (Addendum)
 Transition of Care [facility]) - Progression Note    Patient Details  Name: Ronald [name] MRN: [id number] Date of Birth: Jun 10, 1940  Transition of Care [facility]) CM/SW Contact  Geraldina Klinefelter, RN Phone Number: 07/10/2023, 2:08 PM  Clinical Narrative:    Pt readmission risk increased. Assessment complete. Wife rec'd call from the SW at [facility] stating that [facility] has now declined pt due to insurance requiring brokered transportation for Medicaid. [facility] DSS arranges medicaid transportation and everyone in the department has left for the day and will not be back until Monday. TOC will follow-up Monday. Provided wife with written instructions and contact information for [facility] Medicare member services that does provided limited transportation services and [facility] DSS transportation division. Both pt and his wife speak [place] as a second language.   Expected Discharge Plan: Home w Home Health Services Barriers to Discharge: Continued Medical Work up  Expected Discharge Plan and Services In-house Referral: Clinical Social Work Discharge Planning Services: CM Consult Post Acute Care Choice: Home Health Living arrangements for the past 2 months: Single Family Home     Social Determinants of Health (SDOH) Interventions SDOH Screenings   Food Insecurity: Patient Unable To Answer (07/09/2023)  Housing: Patient Unable To Answer (07/09/2023)  Transportation Needs: Patient Unable To Answer (07/09/2023)  Utilities: Patient Unable To Answer (07/09/2023)  Depression (PHQ2-9): Medium Risk (07/08/2023)  Financial Resource Strain: Low Risk  (07/07/2023)  Physical Activity: Inactive (07/07/2023)  Social Connections: Unknown (07/09/2023)  Stress: No Stress Concern Present (07/07/2023)  Tobacco Use: Medium Risk (07/09/2023)  Health Literacy: Inadequate Health Literacy (07/07/2023)   Readmission Risk Interventions    07/02/2023   10:31 AM 06/23/2023    3:01 PM 11/14/2022    7:26  AM  Readmission Risk Prevention Plan  Transportation Screening Complete Complete Complete  HRI or Home Care Consult  Complete Complete  Social Work Consult for Recovery Care Planning/Counseling  Complete Complete  Palliative Care Screening  Not Applicable Not Applicable  Medication Review [employer]) Complete Complete Complete  PCP or Specialist appointment within 3-5 days of discharge Complete    HRI or Home Care Consult Complete

## 2023-07-10 NOTE — Progress Notes (Signed)
 PROGRESS NOTE    Ronald [name]  ZOX:[id number] DOB: October 29, 1940 DOA: 07/08/2023 PCP: Omie Bickers, MD   Brief Narrative:    Ronald [name] is a 83 y.o. male with medical history significant for CAD/CABG, HFmrEF, ESRD on HD MF, CVA, HTN, neurocognitive disorder, A-fib not on [facility], AAA with retroperitoneal hematoma s/p EVAR on 5/17 by Dr. Fulton Job in recent discharge on 5/23 after hospitalization for ABLA and GI bleed.  At that time, he was transfused 3 units of PRBC and GI recommended continuing on Protonix  twice daily and he was resumed on Plavix  and no longer on aspirin .  He was discharged on Augmentin for 2 more days to complete course of treatment for ischemic colitis.  He came back to the ED with worsening weakness and shortness of breath in the setting of missed hemodialysis this past Monday.  Spouse states that they have been having difficulty with transport to hemodialysis and they had refused SNF on prior admission.  He continues to have some ongoing dark stools and chronic abdominal pain.  He has undergone hemodialysis 5/28.  He is requiring sitter at bedside due to behavioral disturbances.  Palliative consultation with plans to pursue further outpatient palliative services.  He continues to have some dyspnea and wheezing and requires further inpatient dialysis.  Assessment & Plan:   Principal Problem:   Dyspnea and respiratory abnormalities Active Problems:   Essential hypertension   Anemia   Atherosclerosis of coronary artery without angina pectoris   Benign prostatic hyperplasia   AAA (abdominal aortic aneurysm) (HCC)   Atrial fibrillation with controlled ventricular response (HCC)   Acute congestive heart failure (HCC)   DNR (do not resuscitate)  Assessment and Plan:   Acute on chronic HFmrEF in the setting of volume overload with missed hemodialysis - Dialysis as below - Spouse states that she requires some assistance with transport to hemodialysis and refuses to go  to SNF - Currently on room air and without any significant tachypnea -Appreciate palliative consultation with recommendations for outpatient services   ESRD on HD - Continue on dialysis per nephrology with plans to further dialyze today. -Torsemide initiated per nephrology   Paroxysmal atrial fibrillation - Not on anticoagulation due to bleeding risk - Monitor on telemetry - Continue Toprol -XL and home amiodarone    History of CAD/CABG/chronic LBBB - Continue Plavix  and statin - Plan to keep hemoglobin greater than 8 and monitor   AAA with retroperitoneal hematoma status post EVAR on 5/17 - Performed by Dr. Fulton Job - Continue Plavix  and statin - Aspirin  discontinued indefinitely   Severe dementia - Noted to require sitter at bedside -Haldol  as needed with periods of agitation   Left renal lesion - Follow-up CT/MRI outpatient   History of prostate cancer/BPH - Status post seed implantation, follows with Dr. Claretta Croft - Continue Flomax    Physical deconditioning - PT/OT evaluation with recommendations for no further follow-up   Dyslipidemia - Continue statin   DVT prophylaxis: SCDs Code Status: DNR Family Communication: Discussed with daughter Rikki Che on phone 5/29 [phone] Disposition Plan:  Status is: Observation The patient will require care spanning > 2 midnights and should be moved to inpatient because: Need for close monitoring.    Consultants:  Nephrology Palliative care  Procedures:  None  Antimicrobials:  None   Subjective: Patient seen and evaluated today with noted agitation and anxiety overnight requiring sitter at bedside.  Plans are for further hemodialysis today and he continues to have some coughing and chest congestion.  Objective: Vitals:   07/10/23 1315 07/10/23 1330 07/10/23 1400 07/10/23 1430  BP: 115/64 116/71 126/70 127/69  Pulse: 69 80 77 74  Resp: 16 15 16 15   Temp:      TempSrc:      SpO2:      Weight:         Intake/Output Summary (Last 24 hours) at 07/10/2023 1454 Last data filed at 07/10/2023 0833 Gross per 24 hour  Intake 840 ml  Output --  Net 840 ml   Filed Weights   07/08/23 1419 07/08/23 1632 07/10/23 1222  Weight: 97 kg 95.5 kg 99.2 kg    Examination:  General exam: Appears calm and comfortable  Respiratory system: Clear to auscultation. Respiratory effort normal. Cardiovascular system: S1 & S2 heard, RRR.  Gastrointestinal system: Abdomen is soft Central nervous system: Alert and awake Extremities: No edema Skin: No significant lesions noted Psychiatry: Flat affect.    Data Reviewed: I have personally reviewed following labs and imaging studies  CBC: Recent Labs  Lab 07/08/23 1130 07/09/23 0422 07/10/23 0759  WBC 5.8 5.0 5.4  HGB 8.5* 8.5* 8.4*  HCT 25.8* 26.0* 26.6*  MCV 93.1 93.9 95.3  PLT 281 247 234   Basic Metabolic Panel: Recent Labs  Lab 07/08/23 1130 07/09/23 0422 07/10/23 0759  NA 129* 131* 132*  K 3.8 3.9 4.5  CL 88* 94* 91*  CO2 22 24 25   GLUCOSE 120* 105* 100*  BUN 51* 43* 62*  CREATININE 13.13* 11.10* 13.45*  CALCIUM  7.9* 7.8* 7.7*  MG  --  1.8 2.0  PHOS  --   --  7.9*   GFR: Estimated Creatinine Clearance: 5.4 mL/min (A) (by C-G formula based on SCr of 13.45 mg/dL (H)). Liver Function Tests: Recent Labs  Lab 07/08/23 1130  AST 17  ALT 6  ALKPHOS 97  BILITOT 0.8  PROT 5.9*  ALBUMIN  2.6*   No results for input(s): "LIPASE", "AMYLASE" in the last 168 hours. No results for input(s): "AMMONIA" in the last 168 hours. Coagulation Profile: No results for input(s): "INR", "PROTIME" in the last 168 hours. Cardiac Enzymes: No results for input(s): "CKTOTAL", "CKMB", "CKMBINDEX", "TROPONINI" in the last 168 hours. BNP (last 3 results) No results for input(s): "PROBNP" in the last 8760 hours. HbA1C: No results for input(s): "HGBA1C" in the last 72 hours. CBG: No results for input(s): "GLUCAP" in the last 168 hours. Lipid  Profile: No results for input(s): "CHOL", "HDL", "LDLCALC", "TRIG", "CHOLHDL", "LDLDIRECT" in the last 72 hours. Thyroid  Function Tests: No results for input(s): "TSH", "T4TOTAL", "FREET4", "T3FREE", "THYROIDAB" in the last 72 hours. Anemia Panel: No results for input(s): "VITAMINB12", "FOLATE", "FERRITIN", "TIBC", "IRON", "RETICCTPCT" in the last 72 hours. Sepsis Labs: No results for input(s): "PROCALCITON", "LATICACIDVEN" in the last 168 hours.  Recent Results (from the past 240 hours)  Resp panel by RT-PCR (RSV, Flu A&B, Covid) Anterior Nasal Swab     Status: None   Collection Time: 07/10/23  8:52 AM   Specimen: Anterior Nasal Swab  Result Value Ref Range Status   SARS Coronavirus 2 by RT PCR NEGATIVE NEGATIVE Final    Comment: (NOTE) SARS-CoV-2 target nucleic acids are NOT DETECTED.  The SARS-CoV-2 RNA is generally detectable in upper respiratory specimens during the acute phase of infection. The lowest concentration of SARS-CoV-2 viral copies this assay can detect is 138 copies/mL. A negative result does not preclude SARS-Cov-2 infection and should not be used as the sole basis for treatment or other patient management  decisions. A negative result may occur with  improper specimen collection/handling, submission of specimen other than nasopharyngeal swab, presence of viral mutation(s) within the areas targeted by this assay, and inadequate number of viral copies(<138 copies/mL). A negative result must be combined with clinical observations, patient history, and epidemiological information. The expected result is Negative.  Fact Sheet for Patients:  BloggerCourse.com  Fact Sheet for Healthcare Providers:  SeriousBroker.it  This test is no t yet approved or cleared by the [place]  FDA and  has been authorized for detection and/or diagnosis of SARS-CoV-2 by FDA under an Emergency Use Authorization (EUA). This EUA will  remain  in effect (meaning this test can be used) for the duration of the COVID-19 declaration under Section 564(b)(1) of the Act, 21 U.S.C.section 360bbb-3(b)(1), unless the authorization is terminated  or revoked sooner.       Influenza A by PCR NEGATIVE NEGATIVE Final   Influenza B by PCR NEGATIVE NEGATIVE Final    Comment: (NOTE) The Xpert Xpress SARS-CoV-2/FLU/RSV plus assay is intended as an aid in the diagnosis of influenza from Nasopharyngeal swab specimens and should not be used as a sole basis for treatment. Nasal washings and aspirates are unacceptable for Xpert Xpress SARS-CoV-2/FLU/RSV testing.  Fact Sheet for Patients: BloggerCourse.com  Fact Sheet for Healthcare Providers: SeriousBroker.it  This test is not yet approved or cleared by the [place]  FDA and has been authorized for detection and/or diagnosis of SARS-CoV-2 by FDA under an Emergency Use Authorization (EUA). This EUA will remain in effect (meaning this test can be used) for the duration of the COVID-19 declaration under Section 564(b)(1) of the Act, 21 U.S.C. section 360bbb-3(b)(1), unless the authorization is terminated or revoked.     Resp Syncytial Virus by PCR NEGATIVE NEGATIVE Final    Comment: (NOTE) Fact Sheet for Patients: BloggerCourse.com  Fact Sheet for Healthcare Providers: SeriousBroker.it  This test is not yet approved or cleared by the [place]  FDA and has been authorized for detection and/or diagnosis of SARS-CoV-2 by FDA under an Emergency Use Authorization (EUA). This EUA will remain in effect (meaning this test can be used) for the duration of the COVID-19 declaration under Section 564(b)(1) of the Act, 21 U.S.C. section 360bbb-3(b)(1), unless the authorization is terminated or revoked.  Performed at [facility], [address], [place], [place] [address]           Radiology Studies: DG CHEST PORT 1 VIEW Result Date: 07/09/2023 CLINICAL DATA:  Cough, wheezing EXAM: PORTABLE CHEST 1 VIEW COMPARISON:  07/08/2023 FINDINGS: Prior CABG. Heart borderline in size. Mediastinal contours within normal limits. Interstitial prominence and mild vascular congestion, similar to prior study. No effusions or acute bony abnormality. IMPRESSION: Borderline heart size with continued vascular congestion and interstitial prominence, likely reflecting interstitial edema. Findings similar to prior study. Electronically Signed   By: Janeece Mechanic M.D.   On: 07/09/2023 22:33     Scheduled Meds:  amiodarone   200 mg Oral Daily   atorvastatin   80 mg Oral q AM   benzonatate  100 mg Oral TID   calcium  acetate  1,334 mg Oral TID with meals   Chlorhexidine  Gluconate Cloth  6 each Topical Q0600   clopidogrel   75 mg Oral Daily   metoprolol  succinate  50 mg Oral Daily   mirtazapine   15 mg Oral QHS   pantoprazole   40 mg Oral BID   tamsulosin   0.4 mg Oral QPC supper   torsemide  60 mg Oral Daily  LOS: 0 days    Time spent: 55 minutes    Kieley Akter Loran Rock, DO [facility]  If 7PM-7AM, please contact night-coverage [email or web address] 07/10/2023, 2:54 PM

## 2023-07-10 NOTE — Progress Notes (Signed)
   HEMODIALYSIS TREATMENT NOTE:  3h 71m treatment competed using left forearm AVF (15g/antegrade). Goal nearly met.  2.8 liters removed.  Pt's wife remained bedside for entire session.  When pt would wake, confused, she was able to immediately reassure him.  He reached for needle lines twice, but stated it was just "to scratch, it itches."  Tx ended early due to bloodied transducer (would have needed to change entire cartridge).   Post-HD:  07/10/23 1620  Vitals  Temp 98.5 F (36.9 C)  Temp Source Oral  BP (!) 141/68  MAP (mmHg) 92  BP Location Right Arm  BP Method Automatic  Patient Position (if appropriate) Lying  Pulse Rate 70  Pulse Rate Source Monitor  Resp 18  Oxygen Therapy  SpO2 98 %  O2 Device Room Air  During Treatment Monitoring  Cumulative Fluid Removed (mL) per Treatment  2800  Post Treatment  Dialyzer Clearance Lightly streaked  Hemodialysis Intake (mL) 0 mL  Liters Processed 79.9  Fluid Removed (mL) 2800 mL  Tolerated HD Treatment Yes  Post-Hemodialysis Comments See PN  AVG/AVF Arterial Site Held (minutes) 6 minutes  AVG/AVF Venous Site Held (minutes) 6 minutes  Fistula / Graft Left Forearm Arteriovenous fistula  No placement date or time found.   Placed prior to admission: Yes  Orientation: Left  Access Location: Forearm  Access Type: Arteriovenous fistula  Fistula / Graft Assessment Thrill;Bruit  Status Patent    Waunita Haff, RN AP [facility]

## 2023-07-11 DIAGNOSIS — R0600 Dyspnea, unspecified: Secondary | ICD-10-CM | POA: Diagnosis not present

## 2023-07-11 DIAGNOSIS — R0689 Other abnormalities of breathing: Secondary | ICD-10-CM | POA: Diagnosis not present

## 2023-07-11 DIAGNOSIS — N186 End stage renal disease: Secondary | ICD-10-CM | POA: Diagnosis not present

## 2023-07-11 DIAGNOSIS — Z992 Dependence on renal dialysis: Secondary | ICD-10-CM | POA: Diagnosis not present

## 2023-07-11 LAB — BASIC METABOLIC PANEL WITH GFR
Anion gap: 14 (ref 5–15)
BUN: 38 mg/dL — ABNORMAL HIGH (ref 8–23)
CO2: 28 mmol/L (ref 22–32)
Calcium: 8.1 mg/dL — ABNORMAL LOW (ref 8.9–10.3)
Chloride: 94 mmol/L — ABNORMAL LOW (ref 98–111)
Creatinine, Ser: 9.1 mg/dL — ABNORMAL HIGH (ref 0.61–1.24)
GFR, Estimated: 5 mL/min — ABNORMAL LOW (ref >60)
Glucose, Bld: 112 mg/dL — ABNORMAL HIGH (ref 70–99)
Potassium: 4 mmol/L (ref 3.5–5.1)
Sodium: 136 mmol/L (ref 135–145)

## 2023-07-11 LAB — CBC
HCT: 30.1 % — ABNORMAL LOW (ref 39.0–52.0)
Hemoglobin: 9.5 g/dL — ABNORMAL LOW (ref 13.0–17.0)
MCH: 30.1 pg (ref 26.0–34.0)
MCHC: 31.6 g/dL (ref 30.0–36.0)
MCV: 95.3 fL (ref 80.0–100.0)
Platelets: 225 10*3/uL (ref 150–400)
RBC: 3.16 MIL/uL — ABNORMAL LOW (ref 4.22–5.81)
RDW: 18.3 % — ABNORMAL HIGH (ref 11.5–15.5)
WBC: 4.5 10*3/uL (ref 4.0–10.5)
nRBC: 0 % (ref 0.0–0.2)

## 2023-07-11 LAB — MAGNESIUM: Magnesium: 2 mg/dL (ref 1.7–2.4)

## 2023-07-11 MED ORDER — BENZONATATE 100 MG PO CAPS: 100.0000 mg | ORAL_CAPSULE | Freq: Three times a day (TID) | ORAL | 0 refills | Status: DC | PRN

## 2023-07-11 MED ORDER — TORSEMIDE 60 MG PO TABS: 60.0000 mg | ORAL_TABLET | Freq: Every day | ORAL | 1 refills | Status: DC

## 2023-07-11 NOTE — Discharge Summary (Signed)
 Physician Discharge Summary  [name] NWG:[id number] DOB: 1941-01-08 DOA: 07/08/2023  PCP: Omie Bickers, MD  Admit date: 07/08/2023  Discharge date: 07/11/2023  Admitted From:Home  Disposition:  Home  Recommendations for Outpatient Follow-up:  Follow up with PCP in 1-2 weeks Continue home medications as prior Go to hemodialysis as routinely scheduled Follow-up with outpatient palliative services  Home Health: Outpatient palliative services  Equipment/Devices: None  Discharge Condition:Stable  CODE STATUS: DNR  Diet recommendation: Heart Healthy  Brief/Interim Summary: Ronald [name] is a 83 y.o. male with medical history significant for CAD/CABG, HFmrEF, ESRD on HD MF, CVA, HTN, neurocognitive disorder, A-fib not on [facility], AAA with retroperitoneal hematoma s/p EVAR on 5/17 by Dr. Fulton Job in recent discharge on 5/23 after hospitalization for ABLA and GI bleed.  At that time, he was transfused 3 units of PRBC and GI recommended continuing on Protonix  twice daily and he was resumed on Plavix  and no longer on aspirin .  He was discharged on Augmentin  for 2 more days to complete course of treatment for ischemic colitis.  He came back to the ED with worsening weakness and shortness of breath in the setting of missed hemodialysis this past Monday.  Spouse states that they have been having difficulty with transport to hemodialysis and they had refused SNF on prior admission.  He continues to have some ongoing dark stools and chronic abdominal pain.  He has undergone hemodialysis 5/28.  He is requiring sitter at bedside due to behavioral disturbances.  Palliative consultation with plans to pursue further outpatient palliative services.   Patient was overall admitted for acute on chronic HFmrEF in the setting of missed hemodialysis sessions and is noted to have recurrent hospitalizations because of this.  He continues to have chronic GI issues as well.  He has been seen by palliative  with recommendations to follow-up further with outpatient palliative services.  No other acute events or concerns noted throughout the course of this day and he did require sitter at bedside due to his propensity for behavioral disturbances on account of his dementia.  Discharge Diagnoses:  Principal Problem:   Dyspnea and respiratory abnormalities Active Problems:   Essential hypertension   Anemia   Atherosclerosis of coronary artery without angina pectoris   Benign prostatic hyperplasia   AAA (abdominal aortic aneurysm) (HCC)   Atrial fibrillation with controlled ventricular response (HCC)   Acute congestive heart failure (HCC)   DNR (do not resuscitate)  Principal discharge diagnosis: Acute on chronic HFmrEF secondary to volume overload in the setting of missed hemodialysis.  Discharge Instructions  Discharge Instructions     Diet - low sodium heart healthy   Complete by: As directed    Increase activity slowly   Complete by: As directed       Allergies as of 07/11/2023   No Known Allergies      Medication List     TAKE these medications    amiodarone  200 MG tablet Commonly known as: PACERONE  Take 1 tablet (200 mg total) by mouth daily.   atorvastatin  80 MG tablet Commonly known as: LIPITOR Take 80 mg by mouth in the morning.   benzonatate  100 MG capsule Commonly known as: TESSALON  Take 1 capsule (100 mg total) by mouth 3 (three) times daily as needed for cough.   calcium  acetate 667 MG capsule Commonly known as: PHOSLO  Take 1,334 mg by mouth 3 (three) times daily. citracal   clopidogrel  75 MG tablet Commonly known as: PLAVIX  Take 1 tablet (  75 mg total) by mouth daily.   metoprolol  succinate 50 MG 24 hr tablet Commonly known as: Toprol  XL Take 1 tablet (50 mg total) by mouth daily. Take with or immediately following a meal.   mirtazapine  15 MG tablet Commonly known as: REMERON  Take 15 mg by mouth at bedtime.   pantoprazole  40 MG tablet Commonly  known as: PROTONIX  Take 1 tablet (40 mg total) by mouth 2 (two) times daily for 60 days, THEN 1 tablet (40 mg total) daily. Start taking on: Jul 02, 2023   polyethylene glycol powder 17 GM/SCOOP powder Commonly known as: GLYCOLAX /MIRALAX  Take 17 g by mouth 2 (two) times daily as needed for mild constipation.   tamsulosin  0.4 MG Caps capsule Commonly known as: FLOMAX  Take 1 capsule (0.4 mg total) by mouth daily after supper.   Torsemide  60 MG Tabs Take 60 mg by mouth daily. Start taking on: July 12, 2023   zolpidem  10 MG tablet Commonly known as: AMBIEN  Take 10 mg by mouth at bedtime as needed for sleep.        Follow-up Information     Omie Bickers, MD Follow up.   Specialty: Internal Medicine Contact information: [address] [place] [address] [phone]                No Known Allergies  Consultations: Nephrology Palliative care   Procedures/Studies: DG CHEST PORT 1 VIEW Result Date: 07/09/2023 CLINICAL DATA:  Cough, wheezing EXAM: PORTABLE CHEST 1 VIEW COMPARISON:  07/08/2023 FINDINGS: Prior CABG. Heart borderline in size. Mediastinal contours within normal limits. Interstitial prominence and mild vascular congestion, similar to prior study. No effusions or acute bony abnormality. IMPRESSION: Borderline heart size with continued vascular congestion and interstitial prominence, likely reflecting interstitial edema. Findings similar to prior study. Electronically Signed   By: Janeece Mechanic M.D.   On: 07/09/2023 22:33   DG Chest Portable 1 View Result Date: 07/08/2023 CLINICAL DATA:  Shortness of breath EXAM: PORTABLE CHEST 1 VIEW COMPARISON:  11/12/2022 FINDINGS: Prior median sternotomy. Atherosclerotic calcification of the aortic arch. Upper normal heart size. Increased bilateral interstitial accentuation especially centrally with indistinct pulmonary vasculature suggesting worsening interstitial pulmonary edema given some faint scattered Kerley B  lines. Atypical pneumonia is a differential diagnostic consideration. Trace fluid in the minor fissure. No blunting of the costophrenic angles. IMPRESSION: 1. Increased bilateral interstitial accentuation especially centrally with indistinct pulmonary vasculature suggesting worsening interstitial pulmonary edema. Atypical pneumonia is a differential diagnostic consideration. 2. Upper normal heart size. 3. Prior median sternotomy. 4. Aortic Atherosclerosis (ICD10-I70.0). Electronically Signed   By: Freida Jes M.D.   On: 07/08/2023 15:02   VAS US  LOWER EXTREMITY VENOUS (DVT) Result Date: 07/03/2023  Lower Venous DVT Study Patient Name:  KAHLEEL FADELEY  Date of Exam:   07/02/2023 Medical Rec #: [id number]             Accession #:    [id number] Date of Birth: August 04, 1940             Patient Gender: M Patient Age:   78 years Exam Location:  [facility] Procedure:      VAS US  LOWER EXTREMITY VENOUS (DVT) Referring Phys: Lorella Roles GONFA --------------------------------------------------------------------------------  Indications: Swelling.  Risk Factors: None identified. Comparison Study: No prior studies. Performing Technologist: Lerry Ransom RVT  Examination Guidelines: A complete evaluation includes B-mode imaging, spectral Doppler, color Doppler, and power Doppler as needed of all accessible portions of each vessel. Bilateral testing is considered  an integral part of a complete examination. Limited examinations for reoccurring indications may be performed as noted. The reflux portion of the exam is performed with the patient in reverse Trendelenburg.  +---------+---------------+---------+-----------+----------+--------------+ RIGHT    CompressibilityPhasicitySpontaneityPropertiesThrombus Aging +---------+---------------+---------+-----------+----------+--------------+ CFV      Full           Yes      Yes                                  +---------+---------------+---------+-----------+----------+--------------+ SFJ      Full                                                        +---------+---------------+---------+-----------+----------+--------------+ FV Prox  Full                                                        +---------+---------------+---------+-----------+----------+--------------+ FV Mid   Full                                                        +---------+---------------+---------+-----------+----------+--------------+ FV DistalFull                                                        +---------+---------------+---------+-----------+----------+--------------+ PFV      Full                                                        +---------+---------------+---------+-----------+----------+--------------+ POP      Full           Yes      Yes                                 +---------+---------------+---------+-----------+----------+--------------+ PTV      Full                                                        +---------+---------------+---------+-----------+----------+--------------+ PERO     Full                                                        +---------+---------------+---------+-----------+----------+--------------+   +---------+---------------+---------+-----------+----------+--------------+ LEFT     CompressibilityPhasicitySpontaneityPropertiesThrombus Aging +---------+---------------+---------+-----------+----------+--------------+ CFV  Full           Yes      Yes                                 +---------+---------------+---------+-----------+----------+--------------+ SFJ      Full                                                        +---------+---------------+---------+-----------+----------+--------------+ FV Prox  Full                                                         +---------+---------------+---------+-----------+----------+--------------+ FV Mid   Full                                                        +---------+---------------+---------+-----------+----------+--------------+ FV DistalFull                                                        +---------+---------------+---------+-----------+----------+--------------+ PFV      Full                                                        +---------+---------------+---------+-----------+----------+--------------+ POP      Full           Yes      Yes                                 +---------+---------------+---------+-----------+----------+--------------+ PTV      Full                                                        +---------+---------------+---------+-----------+----------+--------------+ PERO     Full                                                        +---------+---------------+---------+-----------+----------+--------------+     Summary: RIGHT: - There is no evidence of deep vein thrombosis in the lower extremity.  - No cystic structure found in the popliteal fossa.  LEFT: - There is no evidence of deep vein thrombosis in the lower extremity.  - No cystic structure found in the popliteal fossa.  *See table(s) above for measurements and  observations. Electronically signed by Delaney Fearing on 07/03/2023 at 8:18:32 AM.    Final    CT ABDOMEN PELVIS W CONTRAST Result Date: 06/28/2023 CLINICAL DATA:  Acute nonlocalized abdominal pain EXAM: CT ABDOMEN AND PELVIS WITH CONTRAST TECHNIQUE: Multidetector CT imaging of the abdomen and pelvis was performed using the standard protocol following bolus administration of intravenous contrast. RADIATION DOSE REDUCTION: This exam was performed according to the departmental dose-optimization program which includes automated exposure control, adjustment of the mA and/or kV according to patient size and/or use of iterative  reconstruction technique. CONTRAST:  OMNIPAQUE  IOHEXOL  300 MG/ML  SOLN COMPARISON:  06/19/2023 FINDINGS: Lower chest: Improving consolidation within the visualized posterobasal right lower lobe. Small right pleural effusion is stable. Cardiac size within normal limits. Hepatobiliary: No focal liver abnormality is seen. No gallstones, gallbladder wall thickening, or biliary dilatation. Pancreas: Unremarkable Spleen: Unremarkable Adrenals/Urinary Tract: The adrenal glands are unremarkable. The kidneys are markedly atrophic bilaterally. Multiple simple cortical cysts are seen within the kidneys bilaterally for which no follow-up imaging is recommended. 2.4 cm hyperdense lesion is seen within the lower pole the left kidney which is indeterminate but demonstrates approximately 5 mm interval increase in size since more remote prior examination of 03/25/2022. The kidneys are otherwise unremarkable. Bladder unremarkable. Stomach/Bowel: There is diffuse mural thickening and mild pericolonic inflammatory stranding involving descending and proximal sigmoid colon suspicious for changes of ischemic colitis though a long segment infectious or inflammatory colitis could appear similarly. The stomach, small bowel, and large bowel are otherwise unremarkable. Appendix absent. No free intraperitoneal gas or fluid. No evidence of obstruction. Vascular/Lymphatic: Endograft repair of an infrarenal abdominal aortic aneurysm is again noted with extension of the proximal landing zone cranially with partial coverage of the origin of the superior mesenteric artery best seen on sagittal image # 84/4 the bifurcated stent graft demonstrates distal landing zones within the left distal common iliac artery just above the bifurcation and appears to extend into the right external iliac artery, unchanged from prior examination. The aneurysm sac is unchanged measuring 11.1 x 11.3 cm at axial image # 48/2 in the region of the contained leak along  the left posterolateral margin aneurysm sac. There is extensive atherosclerotic calcification within the proximal and mid superior mesenteric artery without evidence of hemodynamically significant stenosis on this non arteriographic study. Patency of the inferior mesenteric artery is not well assessed on this non arteriographic study. Stable hematoma within the right inguinal region superficial to the right common femoral artery. Hematoma within the right retroperitoneum appears decreased in size since prior examination. No active extravasation identified. No pathologic adenopathy within the abdomen and pelvis. Reproductive: Fiducial markers noted within prostate gland. Mild prostatic hypertrophy. Other: Mild diffuse subcutaneous body wall edema. Small left inguinal hernia again noted with the proximal sigmoid colon noted in the region of the internal ring. Musculoskeletal: No acute bone abnormality. Degenerative changes are seen within the lumbar spine. IMPRESSION: 1. Diffuse mural thickening and mild pericolonic inflammatory stranding involving the descending and proximal sigmoid colon suspicious for changes of ischemic colitis though a long segment infectious or inflammatory colitis could appear similarly. Superior mesenteric artery is patent though diseased with partial coverage of the origin by the proximally extended endovascular stent graft. Inferior mesenteric artery patency is not well assessed on this non arteriographic examination. No evidence of obstruction or perforation. 2. 11.3 cm infrarenal abdominal aortic aneurysm status post endograft repair. Stable size of the contained leak along the left posterolateral margin of the aneurysm  sac. No active extravasation identified. 3. Stable right inguinal hematoma superficial to the right common femoral artery. Decreasing right retroperitoneal hematoma. No active extravasation identified. 4. 2.4 cm indeterminate hyperdense lesion within the lower pole the left  kidney demonstrating approximately 5 mm interval increase in size since more remote prior examination of 03/25/2022. While this may represent a hemorrhagic or proteinaceous cyst, follow-up multiphasic CT or MRI examination is recommended for further evaluation. 5. Improving consolidation within the visualized posterobasal right lower lobe. Stable small right pleural effusion. 6. Small left inguinal hernia with the proximal sigmoid colon noted in the region of the internal ring. No evidence of obstruction. Aortic aneurysm NOS (ICD10-I71.9). Electronically Signed   By: Worthy Heads M.D.   On: 06/28/2023 02:17   CT Angio Chest/Abd/Pel for Dissection W and/or W/WO Result Date: 06/19/2023 CLINICAL DATA:  Aortic aneurysm EXAM: CT ANGIOGRAPHY CHEST, ABDOMEN AND PELVIS TECHNIQUE: Non-contrast CT of the chest was initially obtained. Multidetector CT imaging through the chest, abdomen and pelvis was performed using the standard protocol during bolus administration of intravenous contrast. Multiplanar reconstructed images and MIPs were obtained and reviewed to evaluate the vascular anatomy. RADIATION DOSE REDUCTION: This exam was performed according to the departmental dose-optimization program which includes automated exposure control, adjustment of the mA and/or kV according to patient size and/or use of iterative reconstruction technique. CONTRAST:  OMNIPAQUE  IOHEXOL  350 MG/ML SOLN COMPARISON:  None Available. FINDINGS: CTA CHEST FINDINGS Cardiovascular: Calcific atherosclerosis is present in the native coronary arteries. Aortic valve calcifications. The tubular ascending thoracic aorta is within normal limits measuring approximately 37 mm. Four vessel aortic arch with proximal origin of the left vertebral artery. Atherosclerotic changes are present in the descending thoracic aorta. Early venous filling is present in the left upper extremity, possibly secondary to vascular access. Enlarged heart.  Mediastinum/Nodes: Postsurgical changes from coronary artery bypass grafting. Lungs/Pleura: Mild interstitial prominence. Trace bilateral pleural effusions. Patchy consolidation is present in the lower lobes, slightly worse on the right. Musculoskeletal: Degenerative changes are present in the imaged osseous structures. Review of the MIP images confirms the above findings. CTA ABDOMEN AND PELVIS FINDINGS VASCULAR Aorta: Post treatment changes from placement of an aortic endograft revision in the infrarenal abdominal aorta. Infrarenal abdominal aortic aneurysm measures 13.2 x 11.1 cm, previously 12.8 x 11 cm. There is also been extension into the right external iliac artery The soft tissue adjacent to the proximal aspect of the abdominal aortic aneurysm sac is morphologically abnormal and described in more detail below. Celiac: Mild atherosclerotic changes in the proximal segment. SMA: Mild atherosclerotic changes in the proximal segment. Renals: The left renal artery is small and demonstrates scant opacification. The right renal artery is not opacified. IMA: The IMA does not enhance in the very proximal origin. The mid segment reconstitutes. Inflow: There is been interval extension of the right iliac stent material into the right external iliac artery. The left limb of the endograft extends into the left common iliac artery. Both stent graft limbs are patent. Veins: There is displacement of the IVC by the infrarenal abdominal aortic aneurysm. Review of the MIP images confirms the above findings. NON-VASCULAR Hepatobiliary: Contrast agent is present within the gallbladder. Pancreas: Nothing significant. Spleen: Normal in size without focal abnormality. Adrenals/Urinary Tract: Renal cortical atrophy with cystic changes noted bilaterally. Overall, the appearance of both kidneys is not significantly changed. Urinary bladder is decompressed. Stomach/Bowel: No dilated loops of bowel are appreciated. A moderate volume of  stool material is present. Bowel  containing left inguinal hernia. Lymphatic: The area adjacent to the proximal aspect of the aneurysm sac is abnormal in appearance. It is possible, that this area represents superimposed lymphadenopathy. A representative lesion is present which estimated at 2.5 cm on image 177 of series 6. Additional possible nodal tissue is present along the left lateral aspect of the aortic aneurysm which is annotated. Reproductive: Post treatment changes are present in the prostate. Other: There is stranding in the right retroperitoneum which is dense and favored to represent a retroperitoneal hematoma. On axial imaging, this measures approximately 8.2 x 2.3 cm. There is significant craniocaudal extent. Postsurgical changes are also present at the right femoral access site. Musculoskeletal: No acute or significant osseous findings. Review of the MIP images confirms the above findings. IMPRESSION: 1. Postsurgical changes from revision aortic endograft. There is no overt evidence of endoleak. A retroperitoneal hematoma is present which is detailed above. The right renal artery is not opacified. 2. Aortic aneurysm measures 13.2 x 11.1 cm, previously 12.8 x 11 cm. 3. The cranial aspect of the abdominal aneurysm sac is abnormal in appearance. Differential considerations include morphologically irregular aneurysm sac versus retroperitoneal lymphadenopathy. Consideration can be given toward outpatient evaluation by PET-CT for problem solving. 4. Small foci of bibasilar airspace consolidation with trace right pleural effusion. Electronically Signed   By: Reagan Camera M.D.   On: 06/19/2023 10:30   HYBRID OR IMAGING ([facility] ONLY) Result Date: 06/17/2023 There is no interpretation for this exam.  This order is for images obtained during a surgical procedure.  Please See "Surgeries" Tab for more information regarding the procedure.     Discharge Exam: Vitals:   07/11/23 0309 07/11/23 0809  BP: 137/79  128/75  Pulse: 86 93  Resp: 20   Temp: 98.1 F (36.7 C)   SpO2: 99%    Vitals:   07/10/23 1634 07/10/23 2009 07/11/23 0309 07/11/23 0809  BP: 119/65 110/75 137/79 128/75  Pulse: 84 97 86 93  Resp: 16 18 20    Temp: 98.2 F (36.8 C) 97.8 F (36.6 C) 98.1 F (36.7 C)   TempSrc: Oral Oral Oral   SpO2: 99% 98% 99%   Weight:        General: Pt is alert, awake, not in acute distress Cardiovascular: RRR, S1/S2 +, no rubs, no gallops Respiratory: CTA bilaterally, no wheezing, no rhonchi Abdominal: Soft, NT, ND, bowel sounds + Extremities: no edema, no cyanosis    The results of significant diagnostics from this hospitalization (including imaging, microbiology, ancillary and laboratory) are listed below for reference.     Microbiology: Recent Results (from the past 240 hours)  Resp panel by RT-PCR (RSV, Flu A&B, Covid) Anterior Nasal Swab     Status: None   Collection Time: 07/10/23  8:52 AM   Specimen: Anterior Nasal Swab  Result Value Ref Range Status   SARS Coronavirus 2 by RT PCR NEGATIVE NEGATIVE Final    Comment: (NOTE) SARS-CoV-2 target nucleic acids are NOT DETECTED.  The SARS-CoV-2 RNA is generally detectable in upper respiratory specimens during the acute phase of infection. The lowest concentration of SARS-CoV-2 viral copies this assay can detect is 138 copies/mL. A negative result does not preclude SARS-Cov-2 infection and should not be used as the sole basis for treatment or other patient management decisions. A negative result may occur with  improper specimen collection/handling, submission of specimen other than nasopharyngeal swab, presence of viral mutation(s) within the areas targeted by this assay, and inadequate number of viral copies(<138  copies/mL). A negative result must be combined with clinical observations, patient history, and epidemiological information. The expected result is Negative.  Fact Sheet for Patients:   BloggerCourse.com  Fact Sheet for Healthcare Providers:  SeriousBroker.it  This test is no t yet approved or cleared by the [place]  FDA and  has been authorized for detection and/or diagnosis of SARS-CoV-2 by FDA under an Emergency Use Authorization (EUA). This EUA will remain  in effect (meaning this test can be used) for the duration of the COVID-19 declaration under Section 564(b)(1) of the Act, 21 U.S.C.section 360bbb-3(b)(1), unless the authorization is terminated  or revoked sooner.       Influenza A by PCR NEGATIVE NEGATIVE Final   Influenza B by PCR NEGATIVE NEGATIVE Final    Comment: (NOTE) The Xpert Xpress SARS-CoV-2/FLU/RSV plus assay is intended as an aid in the diagnosis of influenza from Nasopharyngeal swab specimens and should not be used as a sole basis for treatment. Nasal washings and aspirates are unacceptable for Xpert Xpress SARS-CoV-2/FLU/RSV testing.  Fact Sheet for Patients: BloggerCourse.com  Fact Sheet for Healthcare Providers: SeriousBroker.it  This test is not yet approved or cleared by the [place]  FDA and has been authorized for detection and/or diagnosis of SARS-CoV-2 by FDA under an Emergency Use Authorization (EUA). This EUA will remain in effect (meaning this test can be used) for the duration of the COVID-19 declaration under Section 564(b)(1) of the Act, 21 U.S.C. section 360bbb-3(b)(1), unless the authorization is terminated or revoked.     Resp Syncytial Virus by PCR NEGATIVE NEGATIVE Final    Comment: (NOTE) Fact Sheet for Patients: BloggerCourse.com  Fact Sheet for Healthcare Providers: SeriousBroker.it  This test is not yet approved or cleared by the [place]  FDA and has been authorized for detection and/or diagnosis of SARS-CoV-2 by FDA under an Emergency Use  Authorization (EUA). This EUA will remain in effect (meaning this test can be used) for the duration of the COVID-19 declaration under Section 564(b)(1) of the Act, 21 U.S.C. section 360bbb-3(b)(1), unless the authorization is terminated or revoked.  Performed at [facility], [address] [place], [place] [address]      Labs: BNP (last 3 results) Recent Labs    11/07/22 1540 11/12/22 1508  BNP 1,655.0* 1,510.0*   Basic Metabolic Panel: Recent Labs  Lab 07/08/23 1130 07/09/23 0422 07/10/23 0759 07/11/23 0435  NA 129* 131* 132* 136  K 3.8 3.9 4.5 4.0  CL 88* 94* 91* 94*  CO2 22 24 25 28   GLUCOSE 120* 105* 100* 112*  BUN 51* 43* 62* 38*  CREATININE 13.13* 11.10* 13.45* 9.10*  CALCIUM  7.9* 7.8* 7.7* 8.1*  MG  --  1.8 2.0 2.0  PHOS  --   --  7.9*  --    Liver Function Tests: Recent Labs  Lab 07/08/23 1130  AST 17  ALT 6  ALKPHOS 97  BILITOT 0.8  PROT 5.9*  ALBUMIN  2.6*   No results for input(s): "LIPASE", "AMYLASE" in the last 168 hours. No results for input(s): "AMMONIA" in the last 168 hours. CBC: Recent Labs  Lab 07/08/23 1130 07/09/23 0422 07/10/23 0759 07/11/23 0435  WBC 5.8 5.0 5.4 4.5  HGB 8.5* 8.5* 8.4* 9.5*  HCT 25.8* 26.0* 26.6* 30.1*  MCV 93.1 93.9 95.3 95.3  PLT 281 247 234 225   Cardiac Enzymes: No results for input(s): "CKTOTAL", "CKMB", "CKMBINDEX", "TROPONINI" in the last 168 hours. BNP: Invalid input(s): "POCBNP" CBG: No results for input(s): "GLUCAP" in the  last 168 hours. D-Dimer No results for input(s): "DDIMER" in the last 72 hours. Hgb A1c No results for input(s): "HGBA1C" in the last 72 hours. Lipid Profile No results for input(s): "CHOL", "HDL", "LDLCALC", "TRIG", "CHOLHDL", "LDLDIRECT" in the last 72 hours. Thyroid  function studies No results for input(s): "TSH", "T4TOTAL", "T3FREE", "THYROIDAB" in the last 72 hours.  Invalid input(s): "FREET3" Anemia work up No results for input(s): "VITAMINB12", "FOLATE",  "FERRITIN", "TIBC", "IRON ", "RETICCTPCT" in the last 72 hours. Urinalysis    Component Value Date/Time   COLORURINE YELLOW 06/10/2023 1007   APPEARANCEUR CLEAR 06/10/2023 1007   APPEARANCEUR Clear 10/29/2022 0906   LABSPEC 1.006 06/10/2023 1007   PHURINE 7.0 06/10/2023 1007   GLUCOSEU NEGATIVE 06/10/2023 1007   HGBUR NEGATIVE 06/10/2023 1007   BILIRUBINUR NEGATIVE 06/10/2023 1007   BILIRUBINUR Negative 10/29/2022 0906   KETONESUR NEGATIVE 06/10/2023 1007   PROTEINUR 100 (A) 06/10/2023 1007   UROBILINOGEN 0.2 05/17/2019 0917   NITRITE NEGATIVE 06/10/2023 1007   LEUKOCYTESUR NEGATIVE 06/10/2023 1007   Sepsis Labs Recent Labs  Lab 07/08/23 1130 07/09/23 0422 07/10/23 0759 07/11/23 0435  WBC 5.8 5.0 5.4 4.5   Microbiology Recent Results (from the past 240 hours)  Resp panel by RT-PCR (RSV, Flu A&B, Covid) Anterior Nasal Swab     Status: None   Collection Time: 07/10/23  8:52 AM   Specimen: Anterior Nasal Swab  Result Value Ref Range Status   SARS Coronavirus 2 by RT PCR NEGATIVE NEGATIVE Final    Comment: (NOTE) SARS-CoV-2 target nucleic acids are NOT DETECTED.  The SARS-CoV-2 RNA is generally detectable in upper respiratory specimens during the acute phase of infection. The lowest concentration of SARS-CoV-2 viral copies this assay can detect is 138 copies/mL. A negative result does not preclude SARS-Cov-2 infection and should not be used as the sole basis for treatment or other patient management decisions. A negative result may occur with  improper specimen collection/handling, submission of specimen other than nasopharyngeal swab, presence of viral mutation(s) within the areas targeted by this assay, and inadequate number of viral copies(<138 copies/mL). A negative result must be combined with clinical observations, patient history, and epidemiological information. The expected result is Negative.  Fact Sheet for Patients:   BloggerCourse.com  Fact Sheet for Healthcare Providers:  SeriousBroker.it  This test is no t yet approved or cleared by the [place]  FDA and  has been authorized for detection and/or diagnosis of SARS-CoV-2 by FDA under an Emergency Use Authorization (EUA). This EUA will remain  in effect (meaning this test can be used) for the duration of the COVID-19 declaration under Section 564(b)(1) of the Act, 21 U.S.C.section 360bbb-3(b)(1), unless the authorization is terminated  or revoked sooner.       Influenza A by PCR NEGATIVE NEGATIVE Final   Influenza B by PCR NEGATIVE NEGATIVE Final    Comment: (NOTE) The Xpert Xpress SARS-CoV-2/FLU/RSV plus assay is intended as an aid in the diagnosis of influenza from Nasopharyngeal swab specimens and should not be used as a sole basis for treatment. Nasal washings and aspirates are unacceptable for Xpert Xpress SARS-CoV-2/FLU/RSV testing.  Fact Sheet for Patients: BloggerCourse.com  Fact Sheet for Healthcare Providers: SeriousBroker.it  This test is not yet approved or cleared by the [place]  FDA and has been authorized for detection and/or diagnosis of SARS-CoV-2 by FDA under an Emergency Use Authorization (EUA). This EUA will remain in effect (meaning this test can be used) for the duration of the COVID-19 declaration under Section 564(b)(1)  of the Act, 21 U.S.C. section 360bbb-3(b)(1), unless the authorization is terminated or revoked.     Resp Syncytial Virus by PCR NEGATIVE NEGATIVE Final    Comment: (NOTE) Fact Sheet for Patients: BloggerCourse.com  Fact Sheet for Healthcare Providers: SeriousBroker.it  This test is not yet approved or cleared by the [place]  FDA and has been authorized for detection and/or diagnosis of SARS-CoV-2 by FDA under an Emergency Use  Authorization (EUA). This EUA will remain in effect (meaning this test can be used) for the duration of the COVID-19 declaration under Section 564(b)(1) of the Act, 21 U.S.C. section 360bbb-3(b)(1), unless the authorization is terminated or revoked.  Performed at [facility], [address], [place], [place] [address]      Time coordinating discharge: 35 minutes  SIGNED:   Cornelius Dill, DO [facility] 07/11/2023, 10:30 AM  If 7PM-7AM, please contact night-coverage [email or web address]

## 2023-07-11 NOTE — Progress Notes (Signed)
 Mobility Specialist Progress Note:    07/11/23 1110  Mobility  Activity Ambulated with assistance in hallway  Level of Assistance Standby assist, set-up cues, supervision of patient - no hands on  Assistive Device None (uses railings for support every so often)  Distance Ambulated (ft) 200 ft  Range of Motion/Exercises Active;All extremities  Activity Response Tolerated well  Mobility Referral Yes  Mobility visit 1 Mobility  Mobility Specialist Start Time (ACUTE ONLY) 1110  Mobility Specialist Stop Time (ACUTE ONLY) 1130  Mobility Specialist Time Calculation (min) (ACUTE ONLY) 20 min   Pt received in bed, NT in room. Agreeable to mobility, required supervision to stand and ambulate with no AD. Tolerated well, used railing for support when ambulating. Left pt sitting in chair with NT in room. All needs met.  Kendallyn Lippold Mobility Specialist Please contact via Special educational needs teacher or  Rehab office at [phone]

## 2023-07-11 NOTE — Plan of Care (Signed)
  Problem: Clinical Measurements: Goal: Will remain free from infection Outcome: Progressing   Problem: Clinical Measurements: Goal: Respiratory complications will improve Outcome: Progressing   Problem: Skin Integrity: Goal: Risk for impaired skin integrity will decrease Outcome: Progressing

## 2023-07-12 LAB — TYPE AND SCREEN
ABO/RH(D): A POS
Antibody Screen: POSITIVE
DAT, IgG: NEGATIVE
Unit division: 0
Unit division: 0

## 2023-07-12 LAB — BPAM RBC
Blood Product Expiration Date: 202506252359
Unit Type and Rh: 6200

## 2023-07-13 ENCOUNTER — Telehealth: Payer: Self-pay

## 2023-07-13 NOTE — Transitions of Care (Post Inpatient/ED Visit) (Addendum)
   07/13/2023  Name: Ronald [name] MRN: [id number] DOB: [birth year]  Today's TOC FU Call Status: Today's TOC FU Call Status:: Unsuccessful Call (1st Attempt) Unsuccessful Call (1st Attempt) Date: 07/13/23  Attempted to reach the patient regarding the most recent Inpatient/ED visit.  Follow Up Plan: Additional outreach attempts will be made to reach the patient to complete the Transitions of Care (Post Inpatient/ED visit) call.  Upon chart review for this outreach it was noted that patient was being followed by CCM RN with follow up call scheduled for 07/17/23 and this RN CM notified CCM by secure chat of discharge.    Katheryn Pandy MSN, RN RN Case Sales executive Health  [facility]BC[facility]-Population Health Office Hours M-F [phone] Direct Dial: [phone] Main Phone [phone]  Fax: [phone] [email or web address]

## 2023-07-14 ENCOUNTER — Encounter: Payer: Self-pay | Admitting: *Deleted

## 2023-07-14 ENCOUNTER — Telehealth: Payer: Self-pay

## 2023-07-14 DIAGNOSIS — S51812D Laceration without foreign body of left forearm, subsequent encounter: Secondary | ICD-10-CM | POA: Diagnosis not present

## 2023-07-14 DIAGNOSIS — J449 Chronic obstructive pulmonary disease, unspecified: Secondary | ICD-10-CM | POA: Diagnosis not present

## 2023-07-14 DIAGNOSIS — G894 Chronic pain syndrome: Secondary | ICD-10-CM | POA: Diagnosis not present

## 2023-07-14 DIAGNOSIS — E782 Mixed hyperlipidemia: Secondary | ICD-10-CM | POA: Diagnosis not present

## 2023-07-14 DIAGNOSIS — K219 Gastro-esophageal reflux disease without esophagitis: Secondary | ICD-10-CM | POA: Diagnosis not present

## 2023-07-14 DIAGNOSIS — Z48812 Encounter for surgical aftercare following surgery on the circulatory system: Secondary | ICD-10-CM | POA: Diagnosis not present

## 2023-07-14 DIAGNOSIS — I252 Old myocardial infarction: Secondary | ICD-10-CM | POA: Diagnosis not present

## 2023-07-14 DIAGNOSIS — H3530 Unspecified macular degeneration: Secondary | ICD-10-CM | POA: Diagnosis not present

## 2023-07-14 DIAGNOSIS — I2510 Atherosclerotic heart disease of native coronary artery without angina pectoris: Secondary | ICD-10-CM | POA: Diagnosis not present

## 2023-07-14 DIAGNOSIS — D631 Anemia in chronic kidney disease: Secondary | ICD-10-CM | POA: Diagnosis not present

## 2023-07-14 DIAGNOSIS — I4811 Longstanding persistent atrial fibrillation: Secondary | ICD-10-CM | POA: Diagnosis not present

## 2023-07-14 DIAGNOSIS — I5022 Chronic systolic (congestive) heart failure: Secondary | ICD-10-CM | POA: Diagnosis not present

## 2023-07-14 DIAGNOSIS — N186 End stage renal disease: Secondary | ICD-10-CM | POA: Diagnosis not present

## 2023-07-14 DIAGNOSIS — Z992 Dependence on renal dialysis: Secondary | ICD-10-CM | POA: Diagnosis not present

## 2023-07-14 DIAGNOSIS — H409 Unspecified glaucoma: Secondary | ICD-10-CM | POA: Diagnosis not present

## 2023-07-14 DIAGNOSIS — N2581 Secondary hyperparathyroidism of renal origin: Secondary | ICD-10-CM | POA: Diagnosis not present

## 2023-07-14 DIAGNOSIS — I132 Hypertensive heart and chronic kidney disease with heart failure and with stage 5 chronic kidney disease, or end stage renal disease: Secondary | ICD-10-CM | POA: Diagnosis not present

## 2023-07-14 DIAGNOSIS — Z556 Problems related to health literacy: Secondary | ICD-10-CM | POA: Diagnosis not present

## 2023-07-14 DIAGNOSIS — Z7982 Long term (current) use of aspirin: Secondary | ICD-10-CM | POA: Diagnosis not present

## 2023-07-14 DIAGNOSIS — Z8673 Personal history of transient ischemic attack (TIA), and cerebral infarction without residual deficits: Secondary | ICD-10-CM | POA: Diagnosis not present

## 2023-07-14 DIAGNOSIS — Z7902 Long term (current) use of antithrombotics/antiplatelets: Secondary | ICD-10-CM | POA: Diagnosis not present

## 2023-07-14 NOTE — Transitions of Care (Post Inpatient/ED Visit) (Signed)
 07/14/2023  Name: Ronald [name] MRN: [id number] DOB: 04/29/1940  Today's TOC FU Call Status: Today's TOC FU Call Status:: Successful TOC FU Call Completed TOC FU Call Complete Date: 07/14/23 Patient's Name and Date of Birth confirmed.  Transition Care Management Follow-up Telephone Call Date of Discharge: 07/11/23 [facility]charge [facility] Type of Discharge: Inpatient Admission Primary Inpatient Discharge Diagnosis:: dyspnea and respiratory abnormalities How have you been since you were released from the hospital?: Better (Better today, terrible yesterday.)  Items Reviewed: Did you receive and understand the discharge instructions provided?: Yes Medications obtained,verified, and reconciled?: Yes (Medications Reviewed) Any new allergies since your discharge?: No Dietary orders reviewed?: No People in Home [RPT]: spouse  Medications Reviewed Today: Medications Reviewed Today     Reviewed by Areta Beer, RN (Case Manager) on 07/14/23 at 1529  Med List Status: <None>   Medication Order Taking? Sig Documenting Provider Last Dose Status Informant  amiodarone  (PACERONE ) 200 MG tablet [id number] Yes Take 1 tablet (200 mg total) by mouth daily. Gonfa, Taye T, MD Taking Active Spouse/Significant Other, Pharmacy Records  atorvastatin  (LIPITOR) 80 MG tablet [id number] Yes Take 80 mg by mouth in the morning. [provider] Taking Active Spouse/Significant Other, Pharmacy Records  benzonatate  (TESSALON ) 100 MG capsule [id number]  Take 1 capsule (100 mg total) by mouth 3 (three) times daily as needed for cough. Doreene Gammon D, DO  Active            Med Note Parke Boll, [place] B   Tue Jul 14, 2023  3:03 PM) Not really giving to patient.   calcium  acetate (PHOSLO ) 667 MG capsule [id number] Yes Take 1,334 mg by mouth 3 (three) times daily. citracal [provider] Taking Active Spouse/Significant Other, Pharmacy Records  clopidogrel  (PLAVIX ) 75 MG tablet [id number] Yes  Take 1 tablet (75 mg total) by mouth daily. Deneen Finical, PA-C Taking Active Spouse/Significant Other, Pharmacy Records  metoprolol  succinate (TOPROL  XL) 50 MG 24 hr tablet [id number] Yes Take 1 tablet (50 mg total) by mouth daily. Take with or immediately following a meal. Baglia, Corrina, PA-C Taking Active Spouse/Significant Other, Pharmacy Records  mirtazapine  (REMERON ) 15 MG tablet [id number] Yes Take 15 mg by mouth at bedtime. [provider] Taking Active Spouse/Significant Other, Pharmacy Records  pantoprazole  (PROTONIX ) 40 MG tablet [id number] Yes Take 1 tablet (40 mg total) by mouth 2 (two) times daily for 60 days, THEN 1 tablet (40 mg total) daily. Gonfa, Taye T, MD Taking Active Spouse/Significant Other, Pharmacy Records  polyethylene glycol powder (GLYCOLAX /MIRALAX ) 17 GM/SCOOP powder [id number] Yes Take 17 g by mouth 2 (two) times daily as needed for mild constipation. Doroteo Gasmen, MD Taking Active Spouse/Significant Other, Pharmacy Records           Med Note Wilber, Curley Double Jul 08, 2023  6:46 PM)    tamsulosin  (FLOMAX ) 0.4 MG CAPS capsule [id number] Yes Take 1 capsule (0.4 mg total) by mouth daily after supper. McKenzie, Arden Beck, MD Taking Active Spouse/Significant Other, Pharmacy Records  torsemide  60 MG TABS [id number] No Take 60 mg by mouth daily.  Patient not taking: Reported on 07/14/2023   Cornelius Dill, DO Not Taking Active            Med Note Parke Boll, Morrell Aran   Tue Jul 14, 2023  3:04 PM) [facility], waiting on a call back today regarding filling of this medication.   zolpidem  (AMBIEN ) 10 MG tablet [id number] Yes Take 10  mg by mouth at bedtime as needed for sleep. [provider] Taking Active Spouse/Significant Other, Pharmacy Records            Home Care and Equipment/Supplies: Were Home Health Services Ordered?: Yes Name of Home Health Agency:: [facility] Has Agency set up a time to come to your home?: Yes First Home  Health Visit Date: 07/14/23 Any new equipment or medical supplies ordered?: No. Has a walker.   Functional Questionnaire: Do you need assistance with bathing/showering or dressing?: Yes Do you need assistance with meal preparation?: Yes Do you need assistance with eating?: No Do you have difficulty maintaining continence: No (On HD) Do you need assistance with getting out of bed/getting out of a chair/moving?: Yes Do you have difficulty managing or taking your medications?: No (Spouse manages medications for patient and is informant.)  Follow up appointments reviewed: PCP Follow-up appointment confirmed?:  (Spouse wants to wait to call after HD, Encouraged to make appointment.) MD Provider Line [phone] Given: No Specialist Hospital Follow-up appointment confirmed?:  (Vascular Dr Corliss Dies on 08/27/23; Nephrology follows at HD. [facility] palliatve had contacted spouse today, 07/14/23.) Do you need transportation to your follow-up appointment?:  (Spouse as informant indicates it depends on how she can handle patient or not on any given day to get him to HD or appointments. She has transportation but at time need help with patient.) Do you understand care options if your condition(s) worsen?: Yes-patient verbalized understanding  SDOH Interventions Today    Flowsheet Row Most Recent Value  SDOH Interventions   Food Insecurity Interventions Community Resources Provided, Other (Comment)  [Followed by [employer] RN CM]  Housing Interventions Intervention Not Indicated  Transportation Interventions [employer] Resources Provided, Associate Professor, Patient Resources (Friends/Family), Other (Comment), AMB Referral  [facility] RN CM following. Has transportaion but had needs related to help with spouse/patient in getting to HD. Is using benefits from [facility] dual but limited.]  Utilities Interventions Intervention Not Indicated  Social Connections Interventions Intervention Not Indicated, Other (Comment), Patient  Unable to Answer  [Followed by CCM RN CM]     07/14/23 VBCI TOC RN CM had successful post discharge outreach with patient's spouse as patient has been confused. Prior to admission, patient was and is enrolled with VBCI CCM program and followed by CCM RN CM as recently as 07/08/23 prior to recent admission.   Spouse declined to enroll in 30 day program and requested a hand-off and continue with CCM RN CM. CCM RN CM was notified on 07/13/23 by [facility] RN CM of discharge and a copy of this encounter will be sent to [facility] RN CM. A follow up is already showing as scheduled for 07/17/23. Spouse requests call after HD around or after 330 pm.   Some possible SDOH needs with food but also receiving food she is unable to use or patient is not able to tolerate. As noted some transportation issues but related to physical/mental status of patient at the time of transportation and spouse needs assistance getting patient to and from car. Does not want to call EMS for transport back to hospital if unable to get to HD.   Spouse was waiting on one [facility], see medication reconciliation. Has not yet made PCP HFU wants to wait till after HD on Friday 07/17/23 and if she is able to get patient to HD. Encouraged to make appointment that can be changed if needed.   No further post discharge TOC outreach as  CCM RN CM to follow per spouse request.    Katheryn Pandy MSN, RN RN Case Manager [facility]  VBCI-Population Health Office Hours M-F [phone] Direct Dial: [phone] Main Phone [phone]  Fax: [phone] [email or web address]

## 2023-07-14 NOTE — Patient Outreach (Signed)
 Complex Care Management   Visit Note  07/21/2023 updated note for 07/14/23  Name:  Ronald [name] MRN: [id number] DOB: 1941/01/25  Situation: Referral received for Complex Care Management related to 2 ED visits on 11/17/23 for Shortness of breath Not feeling well as an End stage renal patient who left hospital PM of NSTEMI, ESRD, Hypertension, CHF, aorta aneurysm DM  I obtained verbal consent from Caregiver Patient.  Visit completed with [place] and [name] Knee  on the phone  Did not attend Hemodialysis (HD) on 07/13/23  The patient has been (reported per the wife) unable to sleep, having audible "noises in his chest", questionable wheezing or fluid in his lungs.  She reports he can walk but cannot stand for long periods nor leave the house. A neighbor helped the wife as she reports she couldn't manage patient alone. She reports he attempted to remove a needle in the hospital and spoke about their wishes. She confirms she received calls about care options but preferred to avoid discussions. She reports he has pain and swelling related to the aneurysm repair.  Wife reports she is unsure if Palliative care may have contacted them in the past Their daughter plans to visit on Father's Day.  Background:   Past Medical History:  Diagnosis Date   CHF (congestive heart failure) (HCC)    ESRD on hemodialysis (HCC)    GERD (gastroesophageal reflux disease)    Glaucoma    Hypertension    Macular degeneration    Myocardial infarction [facility])    1999   Prostate cancer (HCC) 02/2018   Stroke (HCC)    no deficits; had stroke while trying to place "stents in legs".    Assessment: Patient Reported Symptoms:  Cognitive Cognitive Status: Confused or disoriented, Requires Assistance Decision Making Other: no changes      Neurological Neurological Review of Symptoms: No symptoms reported Oher Neurological Symptoms/Conditions [RPT]: no hanges    HEENT HEENT Symptoms Reported: Other: Other  HEENT Symptoms/Conditions: no changes per wife      Cardiovascular Cardiovascular Symptoms Reported: No symptoms reported    Respiratory Respiratory Symptoms Reported: Wheezing Respiratory Conditions: COPD Respiratory Self-Management Outcome: 3 (uncertain)  Endocrine Patient reports the following symptoms related to hypoglycemia or hyperglycemia : No symptoms reported Is patient diabetic?: No Endocrine Self-Management Outcome: 4 (good)  Gastrointestinal Gastrointestinal Symptoms Reported: Other Other Gastrointestinal Symptoms: frequent trips to bathroom urine & stool Gastrointestinal Conditions: Abdominal pain, Bleeding, Diarrhea, Nausea, Reflux/heartburn Gastrointestinal Management Strategies: Activity, Incontinence garment/pad Gastrointestinal Self-Management Outcome: 3 (uncertain) Nutrition Risk Screen (CP): No indicators present  Genitourinary Genitourinary Symptoms Reported: Urgency, Frequency Genitourinary Conditions: Chronic kidney disease, End-stage renal disease, Frequency Genitourinary Management Strategies: Hemodialysis, Incontinence garment/pad, Medication therapy Genitourinary Self-Management Outcome: 3 (uncertain)  Integumentary Integumentary Symptoms Reported: Not assessed    Musculoskeletal Musculoskelatal Symptoms Reviewed: Difficulty walking, Unsteady gait, Weakness Musculoskeletal Conditions: Joint pain, Mobility limited, Unsteady gait Musculoskeletal Management Strategies: Medical device, Routine screening Musculoskeletal Self-Management Outcome: 3 (uncertain)      Psychosocial Psychosocial Symptoms Reported: Other Behavioral Health Conditions: Alzheimer's disease, Dementia Behavioral Management Strategies: Support system, Medication therapy Major Change/Loss/Stressor/Fears (CP): Medical condition, self Techniques to Cope with Loss/Stress/Change: Medication        07/21/2023   10:26 AM  Depression screen PHQ 2/9  Decreased Interest 3  Down, Depressed,  Hopeless 1  PHQ - 2 Score 4  Altered sleeping 2  Tired, decreased energy 2  Change in appetite 1  Feeling bad or failure about yourself  0  Trouble  concentrating 1  Moving slowly or fidgety/restless 0  Suicidal thoughts 0  PHQ-9 Score 10  Difficult doing work/chores Somewhat difficult    There were no vitals filed for this visit.  Medications Reviewed Today   Medications were not reviewed in this encounter     Recommendation:   Continue Current Plan of Care Recommende interaction with palliative care, home health, & MDs  Follow Up Plan:   Telephone follow up appointment date/time:  07/16/23  Jullie Oiler L. Mcarthur Speedy, RN, BSN, CCM [place]  [facility], [facility] RN Care Manager Direct Dial: [phone]  Fax: [phone]

## 2023-07-15 ENCOUNTER — Telehealth: Payer: Self-pay

## 2023-07-15 DIAGNOSIS — N186 End stage renal disease: Secondary | ICD-10-CM | POA: Diagnosis not present

## 2023-07-15 DIAGNOSIS — E1122 Type 2 diabetes mellitus with diabetic chronic kidney disease: Secondary | ICD-10-CM | POA: Diagnosis not present

## 2023-07-15 DIAGNOSIS — Z992 Dependence on renal dialysis: Secondary | ICD-10-CM | POA: Diagnosis not present

## 2023-07-15 NOTE — Telephone Encounter (Signed)
 [facility] . [facility] and let [facility] know we did not previously prescribe this.

## 2023-07-15 NOTE — TOC CM/SW Note (Addendum)
 Contacted [employer], [place] [employer], and [facility] on behalf of Mr. Trusty regarding his transportation benefits being denied. Pt currently has a transportation benefit through [employer] (Ph: [phone], ref# [id number]). There are a limited number of rides but additional rides can be requested by the doctor and/or social worker at the dialysis clinic. [employer] has scheduled pt for every M-W-F starting Monday, June 6 through Monday, June 30. Pt's wife is to call [employer] on Monday, June 23 and every-other Monday thereafter to schedule rides for the following two weeks. All of this information including pick-up times, return trips home and contact info has been conveyed to Kyrstyna Charette (wife) verbally and in a HIPAA compliant text message to her mobile phone.   07/15/2023, 19:00.

## 2023-07-16 ENCOUNTER — Telehealth: Payer: Self-pay | Admitting: *Deleted

## 2023-07-16 ENCOUNTER — Other Ambulatory Visit: Payer: Self-pay

## 2023-07-16 ENCOUNTER — Encounter: Payer: Self-pay | Admitting: *Deleted

## 2023-07-16 DIAGNOSIS — Z992 Dependence on renal dialysis: Secondary | ICD-10-CM | POA: Diagnosis not present

## 2023-07-16 DIAGNOSIS — Z8673 Personal history of transient ischemic attack (TIA), and cerebral infarction without residual deficits: Secondary | ICD-10-CM | POA: Diagnosis not present

## 2023-07-16 DIAGNOSIS — I252 Old myocardial infarction: Secondary | ICD-10-CM | POA: Diagnosis not present

## 2023-07-16 DIAGNOSIS — D631 Anemia in chronic kidney disease: Secondary | ICD-10-CM | POA: Diagnosis not present

## 2023-07-16 DIAGNOSIS — E782 Mixed hyperlipidemia: Secondary | ICD-10-CM | POA: Diagnosis not present

## 2023-07-16 DIAGNOSIS — G894 Chronic pain syndrome: Secondary | ICD-10-CM | POA: Diagnosis not present

## 2023-07-16 DIAGNOSIS — K219 Gastro-esophageal reflux disease without esophagitis: Secondary | ICD-10-CM | POA: Diagnosis not present

## 2023-07-16 DIAGNOSIS — I2510 Atherosclerotic heart disease of native coronary artery without angina pectoris: Secondary | ICD-10-CM | POA: Diagnosis not present

## 2023-07-16 DIAGNOSIS — H409 Unspecified glaucoma: Secondary | ICD-10-CM | POA: Diagnosis not present

## 2023-07-16 DIAGNOSIS — S51812D Laceration without foreign body of left forearm, subsequent encounter: Secondary | ICD-10-CM | POA: Diagnosis not present

## 2023-07-16 DIAGNOSIS — Z7902 Long term (current) use of antithrombotics/antiplatelets: Secondary | ICD-10-CM | POA: Diagnosis not present

## 2023-07-16 DIAGNOSIS — I4811 Longstanding persistent atrial fibrillation: Secondary | ICD-10-CM | POA: Diagnosis not present

## 2023-07-16 DIAGNOSIS — H3530 Unspecified macular degeneration: Secondary | ICD-10-CM | POA: Diagnosis not present

## 2023-07-16 DIAGNOSIS — Z556 Problems related to health literacy: Secondary | ICD-10-CM | POA: Diagnosis not present

## 2023-07-16 DIAGNOSIS — Z7982 Long term (current) use of aspirin: Secondary | ICD-10-CM | POA: Diagnosis not present

## 2023-07-16 DIAGNOSIS — N186 End stage renal disease: Secondary | ICD-10-CM | POA: Diagnosis not present

## 2023-07-16 DIAGNOSIS — Z48812 Encounter for surgical aftercare following surgery on the circulatory system: Secondary | ICD-10-CM | POA: Diagnosis not present

## 2023-07-16 DIAGNOSIS — I132 Hypertensive heart and chronic kidney disease with heart failure and with stage 5 chronic kidney disease, or end stage renal disease: Secondary | ICD-10-CM | POA: Diagnosis not present

## 2023-07-16 DIAGNOSIS — I5022 Chronic systolic (congestive) heart failure: Secondary | ICD-10-CM | POA: Diagnosis not present

## 2023-07-16 DIAGNOSIS — J449 Chronic obstructive pulmonary disease, unspecified: Secondary | ICD-10-CM | POA: Diagnosis not present

## 2023-07-16 DIAGNOSIS — N2581 Secondary hyperparathyroidism of renal origin: Secondary | ICD-10-CM | POA: Diagnosis not present

## 2023-07-16 NOTE — Patient Instructions (Signed)
 Visit Information  Thank you for taking time to visit with me today. Please don't hesitate to contact me if I can be of assistance to you before our next scheduled appointment.  Your next care management appointment is by telephone on 07/23/23 at 0930  Continue to go to Dialysis as ordered  Please call the care guide team at [phone] if you need to cancel, schedule, or reschedule an appointment.   Please call the Suicide and Crisis Lifeline: 988 call the [place]  National Suicide Prevention Lifeline: 4784691130 or TTY: 574 762 0254 TTY 602-860-6403) to talk to a trained counselor call 1-800-273-TALK (toll free, 24 hour hotline) call the [facility]: 515-871-3021 call 911 if you are experiencing a Mental Health or Behavioral Health Crisis or need someone to talk to.  Yenni Carra L. Mcarthur Speedy, RN, BSN, CCM [place]  [facility], [facility] RN Care Manager Direct Dial: [phone]  Fax: [phone]

## 2023-07-16 NOTE — Patient Outreach (Addendum)
 Complex Care Management   Visit Note  07/16/2023  Name:  Ronald [name] MRN: [id number] DOB: Nov 04, 1940  Situation: Referral received for Complex Care Management related to Acute MI and now with ESRD,CKD , cognitive issues I obtained verbal consent from Caregiver.  Visit completed with [name] as patient with cognitive impairment  on the phone  Background:   Past Medical History:  Diagnosis Date   CHF (congestive heart failure) (HCC)    ESRD on hemodialysis (HCC)    GERD (gastroesophageal reflux disease)    Glaucoma    Hypertension    Macular degeneration    Myocardial infarction [facility])    1999   Prostate cancer (HCC) 02/2018   Stroke (HCC)    no deficits; had stroke while trying to place "stents in legs".    Assessment: Patient Reported Symptoms:  Cognitive Cognitive Status: Able to follow simple commands, Poor judgment in daily scenarios, Requires Assistance Decision Making, Struggling with memory recall, Confused or disoriented (spoke with wife as patient has cognitive impairment) Cognitive/Intellectual Conditions Management [RPT]: Other Other: cognitive impariment Wife reports not sleeping, up & down out of bed or chair, plus reports he loses his mobile phone &  not sure how to use it. She reports on 07/15/23 night after HD he was "out of this world"   Health Maintenance Behaviors:  (Dialysis) Health Facilitated by:  (wife offered him sleeping medicine on 07/16/23 Did not help He would lie down 5-10 minutes and be up again)  Neurological Neurological Review of Symptoms: Weakness, Other: [place] Neurological Symptoms/Conditions [RPT]: impaired cognitive disorder Neurological Conditions:  (impared cognition) Neurological Management Strategies: Medication therapy, Routine screening, Medical device (HD, sleep medicine (not working)) Neurological Self-Management Outcome: 2 (bad) Neurological Comment: WIfe reports a very "bad" night on 07/16/23 Patient did not sleep, up and down,  worsening cognitive behavior  HEENT HEENT Symptoms Reported: No symptoms reported HEENT Conditions: Ear problem(s), Vision problem(s) Vision Problems: glaucoma HEENT Management Strategies: Routine screening HEENT Self-Management Outcome: 3 (uncertain) HEENT Comment: hard of hearing at times Ear problem(s), Vision problem(s)  Cardiovascular Cardiovascular Symptoms Reported: Fatigue, Swelling in legs or feet, Other: Other Cardiovascular Symptoms: Does not have diuretic ordered at hospital discharge on 07/11/23 Does patient have uncontrolled Hypertension?: No (reported by wife to have a low BP after HD session delaying transport back home. She reports he was the last person to leave [facility] on 07/15/23 pm) Cardiovascular Conditions: Coronary artery disease, Dysrhythmia, Heart failure, High blood cholesterol, Hypertension, Myocardial infarction Cardiovascular Management Strategies: Routine screening, Medication therapy, Medical device Cardiovascular Self-Management Outcome: 2 (bad)  Respiratory Respiratory Symptoms Reported: No symptoms reported Other Respiratory Symptoms: decrease wheezing after 07/16/23 HD Respiratory Conditions: COPD Respiratory Self-Management Outcome: 4 (good)  Endocrine Patient reports the following symptoms related to hypoglycemia or hyperglycemia : Irritability, Weakness or fatigue Is patient diabetic?: No Endocrine Conditions: Parathyroid disorder Endocrine Management Strategies: Activity, Medication therapy, Routine screening Endocrine Self-Management Outcome: 3 (uncertain)  Gastrointestinal Gastrointestinal Symptoms Reported: Other Other Gastrointestinal Symptoms: frequent visits to bathroom Tarry stools less Gastrointestinal Conditions: Abdominal pain, Bleeding, Diarrhea, Nausea, Reflux/heartburn Gastrointestinal Management Strategies: Activity, Incontinence garment/pad Gastrointestinal Self-Management Outcome: 3 (uncertain) Nutrition Risk Screen (CP): No indicators  present  Genitourinary Genitourinary Symptoms Reported: Other Other Genitourinary Symptoms: attended HD on 07/15/23 weak afterwards, poor sleep 07/15/23 even with offered sleep medicine, which wife reports made it worse Genitourinary Conditions: Chronic kidney disease, End-stage renal disease Genitourinary Management Strategies: Hemodialysis, Medical device, Incontinence garment/pad Hemodialysis Schedule: correction to Dialysis 2 x a week-  Mondays & Fridays Now whenever possible Hemodialysis Last Treatment: 07/15/23 Genitourinary Self-Management Outcome: 2 (bad)  Integumentary Integumentary Symptoms Reported: Skin changes, Other Other Integumentary Symptoms: wife reports surgery site is WNL but patient constantly scratched his skin on his extremities on 07/15/23 until he was bleeding. Wife reports cleaning and dressing areas but he pulled off the dressing Skin Conditions: Other Other Skin Conditions: hx of skin lesion Skin Management Strategies: Activity, Dressing changes, Routine screening Skin Self-Management Outcome: 2 (bad)  Musculoskeletal Musculoskelatal Symptoms Reviewed: Difficulty walking, Weakness Additional Musculoskeletal Details: Fall risk getting in & out of home/HD - weakness, steps, Wife discussed hospital bed helped during admission but declines RN CM assist to get a hospital bed Musculoskeletal Conditions: Joint pain, Mobility limited, Unsteady gait Musculoskeletal Management Strategies: Activity, Medical device, Routine screening Musculoskeletal Self-Management Outcome: 3 (uncertain) Falls in the past year?: No Number of falls in past year: 1 or less Was there an injury with Fall?: No Fall Risk Category Calculator: 0 Patient Fall Risk Level: Low Fall Risk Patient at Risk for Falls Due to: Impaired balance/gait, Impaired mobility, Medication side effect, Mental status change, Impaired vision Fall risk Follow up: Falls evaluation completed, Falls prevention discussed   Psychosocial Psychosocial Symptoms Reported: Other, Anxiety - if selected complete GAD Other Psychosocial Conditions: moderate cognitive impairment Additional Psychological Details: wife reports restlessness During outreach today Wife voiced patient was wandering, looking for her as she was attempting to speak confidentally with RNCM He left the kitchen, walked to the bedroom then got up  within minutes and walked to bathroom with out walker as reported by wife RN CM was able to hear patient in the background calling for wife. She reports he will not allow her to not be within view. " I have to stay by his side always" Behavioral Health Conditions: Other Other Behavorial Health Conditions: moderate cognitive impairment Behavioral Management Strategies: Activity, Community resources, Coping strategies, Support system Behavioral Health Self-Management Outcome: 2 (bad) Major Change/Loss/Stressor/Fears (CP): Medical condition, self, Resources Behaviors When Feeling Stressed/Fearful: not sleeping well, up and down, sratching skin on arms Techniques to Cope with Loss/Stress/Change: Diversional activities, Medication        07/08/2023   11:53 AM  Depression screen PHQ 2/9  Decreased Interest 1  Down, Depressed, Hopeless 1  PHQ - 2 Score 2  Altered sleeping 1  Tired, decreased energy 1  Change in appetite 1  Feeling bad or failure about yourself  0  Trouble concentrating 1  Moving slowly or fidgety/restless 0  Suicidal thoughts 0  PHQ-9 Score 6  Difficult doing work/chores Somewhat difficult    There were no vitals filed for this visit.  Medications Reviewed Today   Medications were not reviewed in this encounter     Recommendation:   PCP Follow-up Continue Current Plan of Care Recommended a hospital bed, palliative care facility services but wife decline all offers except possible medication delivery,  Follow Up Plan:   Telephone follow up appointment date/time:  07/23/23  0930  Jisela Merlino L. Mcarthur Speedy, RN, BSN, CCM [place]  [facility], [facility] RN Care Manager Direct Dial: [phone]  Fax: [phone]

## 2023-07-17 ENCOUNTER — Telehealth: Payer: Self-pay | Admitting: *Deleted

## 2023-07-17 ENCOUNTER — Telehealth: Payer: Self-pay

## 2023-07-17 ENCOUNTER — Encounter: Payer: Self-pay | Admitting: *Deleted

## 2023-07-17 ENCOUNTER — Other Ambulatory Visit: Payer: Self-pay | Admitting: *Deleted

## 2023-07-17 DIAGNOSIS — E8770 Fluid overload, unspecified: Secondary | ICD-10-CM

## 2023-07-17 NOTE — Patient Outreach (Signed)
 Collaboration with [facility] staff related to Torsemide  Spoke with receptionist and the active RN who was assigned to the patient He did not show for HD session today. His wife per susie called to state he would not be able to make it to HD  Susie confirms Dr Zelda Hickman sees patient RN CM left a message for 07/17/23 [facility] NP to return a call to RN CM about patient Torsemide  during her second HD rounds   Kinaya Hilliker L. Mcarthur Speedy, RN, BSN, CCM [place]  [facility], [facility] RN Care Manager Direct Dial: [phone]  Fax: [phone]

## 2023-07-17 NOTE — Patient Outreach (Addendum)
 Incoming  call from pcp/staff  Review of pcp recommendations - outreach to nephrologist for assist with torsemide  management  Paulette Lynch L. Mcarthur Speedy, RN, BSN, CCM [place]  [facility], [facility] RN Care Manager Direct Dial: [phone]  Fax: [phone]

## 2023-07-17 NOTE — Patient Instructions (Signed)
 Visit Information  Thank you for taking time to visit with me today. Please don't hesitate to contact me if I can be of assistance to you before our next scheduled appointment.  Your next care management appointment is by telephone on 07/23/23 at 0930    Please call the care guide team at [phone] if you need to cancel, schedule, or reschedule an appointment.   Please call the Suicide and Crisis Lifeline: 988 call the [place]  National Suicide Prevention Lifeline: (501)264-8887 or TTY: 917 875 9319 TTY 619-007-1668) to talk to a trained counselor call 1-800-273-TALK (toll free, 24 hour hotline) call the [facility]: (657)184-4347 call 911 if you are experiencing a Mental Health or Behavioral Health Crisis or need someone to talk to.  Pahoua Schreiner L. Mcarthur Speedy, RN, BSN, CCM [place]  [facility], [facility] RN Care Manager Direct Dial: [phone]  Fax: [phone]

## 2023-07-17 NOTE — Patient Outreach (Signed)
 Complex Care Management   Visit Note  07/24/2023 updated note for 07/17/23  Name:  Ronald [name] MRN: [id number] DOB: [birth year]  Situation: Referral received for Complex Care Management related to  Acute MI and now with ESRD,CKD , cognitive issues I obtained verbal consent from Caregiver And wife, Ronald [name].  Visit completed with wife Ronald [name]  on the phone  Background:   Past Medical History:  Diagnosis Date   CHF (congestive heart failure) (HCC)    ESRD on hemodialysis (HCC)    GERD (gastroesophageal reflux disease)    Glaucoma    Hypertension    Macular degeneration    Myocardial infarction [facility])    1999   Prostate cancer (HCC) 02/2018   Stroke (HCC)    no deficits; had stroke while trying to place stents in legs.    Assessment: Patient Reported Symptoms:  Cognitive Cognitive Status: Able to follow simple commands, Confused or disoriented, Poor judgment in daily scenarios, Struggling with memory recall (spoke with wife)      Neurological Neurological Review of Symptoms: Weakness Neurological Management Strategies: Adequate rest, Medical device, Medication therapy, Routine screening Neurological Self-Management Outcome: 3 (uncertain)  HEENT HEENT Symptoms Reported: No symptoms reported HEENT Self-Management Outcome: 4 (good)    Cardiovascular Cardiovascular Symptoms Reported: Swelling in legs or feet Does patient have uncontrolled Hypertension?: No Cardiovascular Conditions: Coronary artery disease, Dysrhythmia, Heart failure, High blood cholesterol, Hypertension, Myocardial infarction Cardiovascular Management Strategies: Adequate rest, Medical device, Medication therapy, Routine screening Cardiovascular Self-Management Outcome: 3 (uncertain)  Respiratory Respiratory Symptoms Reported: No symptoms reported Respiratory Conditions: COPD Respiratory Self-Management Outcome: 4 (good)  Endocrine Patient reports the following symptoms related to hypoglycemia or  hyperglycemia : Weakness or fatigue Is patient diabetic?: No Endocrine Conditions: Parathyroid disorder Endocrine Management Strategies: Activity, Adequate rest, Medical device, Medication therapy, Routine screening Endocrine Self-Management Outcome: 4 (good)  Gastrointestinal Gastrointestinal Symptoms Reported: No symptoms reported Gastrointestinal Conditions: Abdominal pain, Bleeding, Diarrhea, Nausea, Reflux/heartburn Gastrointestinal Management Strategies: Adequate rest, Medication therapy, Incontinence garment/pad Gastrointestinal Self-Management Outcome: 3 (uncertain) Nutrition Risk Screen (CP): No indicators present  Genitourinary Genitourinary Symptoms Reported: No symptoms reported Genitourinary Conditions: Chronic kidney disease, End-stage renal disease Genitourinary Management Strategies: Adequate rest, Medication therapy, Hemodialysis Hemodialysis Last Treatment: 07/15/23 Genitourinary Self-Management Outcome: 3 (uncertain)  Integumentary Integumentary Symptoms Reported: No symptoms reported Skin Self-Management Outcome: 4 (good)  Musculoskeletal Musculoskelatal Symptoms Reviewed: Weakness, Unsteady gait, Difficulty walking Additional Musculoskeletal Details: difficulty walking changes daily active with [facility] Musculoskeletal Conditions: Mobility limited, Unsteady gait, Joint pain Musculoskeletal Management Strategies: Adequate rest, Activity, Medical device, Routine screening Musculoskeletal Self-Management Outcome: 3 (uncertain)      Psychosocial Psychosocial Symptoms Reported: No symptoms reported Other Psychosocial Conditions: cognitive impairment Behavioral Management Strategies: Support system, Medication therapy Behavioral Health Self-Management Outcome: 3 (uncertain) Major Change/Loss/Stressor/Fears (CP): Medical condition, self, Resources Techniques to [place] with Loss/Stress/Change: Diversional activities, Medication Quality of Family Relationships:  helpful Do you feel physically threatened by others?: No      07/23/2023    7:33 PM  Depression screen PHQ 2/9  Decreased Interest 1  Down, Depressed, Hopeless 0  PHQ - 2 Score 1    There were no vitals filed for this visit.  Medications Reviewed Today   Medications were not reviewed in this encounter     Recommendation:   Continue Current Plan of Care  Follow Up Plan:   Telephone follow up appointment date/time:  07/23/23  Jullie Oiler L. Mcarthur Speedy, RN, BSN, CCM   Value Based [employer], [employer]  RN Care Manager Direct Dial: [phone]  Fax: [phone]

## 2023-07-17 NOTE — Progress Notes (Signed)
 Complex Care Management Note  Care Guide Note 07/17/2023 Name: Ronald [name] MRN: [id number] DOB: March 23, 1940  Ronald [name] is a 83 y.o. year old male who sees [place], Lethia Raveling, MD for primary care. I reached out to Lavan J Trahan by phone today to offer complex care management services.  Mr. Melikian was given information about Complex Care Management services today including:   The Complex Care Management services include support from the care team which includes your Nurse Care Manager, Clinical Social Worker, or Pharmacist.  The Complex Care Management team is here to help remove barriers to the health concerns and goals most important to you. Complex Care Management services are voluntary, and the patient may decline or stop services at any time by request to their care team member.   Complex Care Management Consent Status: Patient agreed to services and verbal consent obtained.   Follow up plan:  Telephone appointment with complex care management team member scheduled for:  07/21/2023  Encounter Outcome:  Patient Scheduled  Kandis Ormond, CMA [place]  [facility], [facility] Guide Direct Dial: [phone]  Fax: [phone] Website: [email or web address]

## 2023-07-20 DIAGNOSIS — Z992 Dependence on renal dialysis: Secondary | ICD-10-CM | POA: Diagnosis not present

## 2023-07-20 DIAGNOSIS — N186 End stage renal disease: Secondary | ICD-10-CM | POA: Diagnosis not present

## 2023-07-21 ENCOUNTER — Other Ambulatory Visit: Payer: Self-pay

## 2023-07-21 DIAGNOSIS — S51812D Laceration without foreign body of left forearm, subsequent encounter: Secondary | ICD-10-CM | POA: Diagnosis not present

## 2023-07-21 DIAGNOSIS — I4811 Longstanding persistent atrial fibrillation: Secondary | ICD-10-CM | POA: Diagnosis not present

## 2023-07-21 DIAGNOSIS — G894 Chronic pain syndrome: Secondary | ICD-10-CM | POA: Diagnosis not present

## 2023-07-21 DIAGNOSIS — Z48812 Encounter for surgical aftercare following surgery on the circulatory system: Secondary | ICD-10-CM | POA: Diagnosis not present

## 2023-07-21 DIAGNOSIS — Z556 Problems related to health literacy: Secondary | ICD-10-CM | POA: Diagnosis not present

## 2023-07-21 DIAGNOSIS — K219 Gastro-esophageal reflux disease without esophagitis: Secondary | ICD-10-CM | POA: Diagnosis not present

## 2023-07-21 DIAGNOSIS — H3530 Unspecified macular degeneration: Secondary | ICD-10-CM | POA: Diagnosis not present

## 2023-07-21 DIAGNOSIS — Z992 Dependence on renal dialysis: Secondary | ICD-10-CM | POA: Diagnosis not present

## 2023-07-21 DIAGNOSIS — I132 Hypertensive heart and chronic kidney disease with heart failure and with stage 5 chronic kidney disease, or end stage renal disease: Secondary | ICD-10-CM | POA: Diagnosis not present

## 2023-07-21 DIAGNOSIS — H409 Unspecified glaucoma: Secondary | ICD-10-CM | POA: Diagnosis not present

## 2023-07-21 DIAGNOSIS — Z8673 Personal history of transient ischemic attack (TIA), and cerebral infarction without residual deficits: Secondary | ICD-10-CM | POA: Diagnosis not present

## 2023-07-21 DIAGNOSIS — N186 End stage renal disease: Secondary | ICD-10-CM | POA: Diagnosis not present

## 2023-07-21 DIAGNOSIS — N2581 Secondary hyperparathyroidism of renal origin: Secondary | ICD-10-CM | POA: Diagnosis not present

## 2023-07-21 DIAGNOSIS — I5022 Chronic systolic (congestive) heart failure: Secondary | ICD-10-CM | POA: Diagnosis not present

## 2023-07-21 DIAGNOSIS — Z7982 Long term (current) use of aspirin: Secondary | ICD-10-CM | POA: Diagnosis not present

## 2023-07-21 DIAGNOSIS — J449 Chronic obstructive pulmonary disease, unspecified: Secondary | ICD-10-CM | POA: Diagnosis not present

## 2023-07-21 DIAGNOSIS — Z7902 Long term (current) use of antithrombotics/antiplatelets: Secondary | ICD-10-CM | POA: Diagnosis not present

## 2023-07-21 DIAGNOSIS — E782 Mixed hyperlipidemia: Secondary | ICD-10-CM | POA: Diagnosis not present

## 2023-07-21 DIAGNOSIS — D631 Anemia in chronic kidney disease: Secondary | ICD-10-CM | POA: Diagnosis not present

## 2023-07-21 DIAGNOSIS — I2510 Atherosclerotic heart disease of native coronary artery without angina pectoris: Secondary | ICD-10-CM | POA: Diagnosis not present

## 2023-07-21 DIAGNOSIS — I252 Old myocardial infarction: Secondary | ICD-10-CM | POA: Diagnosis not present

## 2023-07-21 NOTE — Patient Instructions (Signed)
 Visit Information  Thank you for taking time to visit with me today. Please don't hesitate to contact me if I can be of assistance to you before our next scheduled appointment.  Your next care management appointment is by telephone on 07/16/23 in the  afternoon    Please call the care guide team at [phone] if you need to cancel, schedule, or reschedule an appointment.   Please call the Suicide and Crisis Lifeline: 988 call the [place]  National Suicide Prevention Lifeline: 562-639-8574 or TTY: 731-766-8839 TTY (702)452-7222) to talk to a trained counselor call 1-800-273-TALK (toll free, 24 hour hotline) call the [facility]: ([phone] call 911 if you are experiencing a Mental Health or Behavioral Health Crisis or need someone to talk to.  Dillin Lofgren L. Mcarthur Speedy, RN, BSN, CCM [place]  [facility], [facility] RN Care Manager Direct Dial: [phone]  Fax: [phone]

## 2023-07-21 NOTE — Patient Instructions (Signed)
 Visit Information  Ronald [name] was given information about Medicaid Managed Care team care coordination services as a part of their [facility] Community Plan Medicaid benefit. Ronald [name] verbally consented to engagement with the [facility] Managed Care team.   If you are experiencing a medical emergency, please call 911 or report to your local emergency department or urgent care.   If you have a non-emergency medical problem during routine business hours, please contact your provider's office and ask to speak with a nurse.   For questions related to your [facility], please call: [phone] or visit the homepage here: [email or web address]  If you would like to schedule transportation through your [facility], please call the following number at least 2 days in advance of your appointment: [phone]   Rides for urgent appointments can also be made after hours by calling Member Services.  Call the Behavioral Health Crisis Line at (872) 147-9791, at any time, 24 hours a day, 7 days a week. If you are in danger or need immediate medical attention call 911.  If you would like help to quit smoking, call 1-800-QUIT-NOW (402-246-3149) OR Espaol: 1-855-Djelo-Ya (1-324-401-0272) o para ms informacin haga clic aqu or Text READY to 536-644 to register via text  [name] - following are the goals we discussed in your visit today:   Goals Addressed             This Visit's Progress    VBCI Social Work Care Plan       Problems:   Disease Management support and education needs related to Depression: disturbed sleep  CSW Clinical Goal(s):   Over the next 90 days the Patient/Caregiver will work with Child psychotherapist to address concerns related to depression.  Interventions:  Mental Health:  Evaluation of current treatment plan related to Depression: disturbed  sleep Active listening / Reflection utilized Caregiver stress acknowledged :Discussed possible caregiver support group. Depression screen reviewed Discussed caregiver resources and support:  Discussed referral options to connect for ongoing therapy: Caregiver has declined at this time. Emotional Support Provided PHQ2/PHQ9 completed Discussed medication management. Caregiver has declined at this time.  Patient Goals/Self-Care Activities:  Continue taking your medication as prescribed.    Plan:   Telephone follow up appointment with care management team member scheduled for:  07/30/2023 at 10:00 am        The patient verbalized understanding of instructions, educational materials, and care plan provided today and DECLINED offer to receive copy of patient instructions, educational materials, and care plan.   Licensed Clinical Social Worker will 07/30/2023 at 10:00 am  Georgine Kitchens, MSW, [employer] [place]  [facility], [facility] Licensed Clinical Social Worker Direct Dial: [phone]

## 2023-07-21 NOTE — Patient Outreach (Addendum)
 Complex Care Management   Visit Note  07/21/2023  Name:  Ronald [name] MRN: [id number] DOB: [birth year]  Situation: Referral received for Complex Care Management related to Mental/Behavioral Health diagnosis depression I obtained verbal consent from caregiver.  Visit completed with caregiver on the phone. LCSW called to follow up on EMMI red alert. Caregiver explained that patient experiences depression due to his illnesses. Caregiver explains that patient would decline treatment for his depression. It was agreed that LCSW would follow up next week to ensure that no other treatment for depression is being requested.  Background:   Past Medical History:  Diagnosis Date   CHF (congestive heart failure) (HCC)    ESRD on hemodialysis (HCC)    GERD (gastroesophageal reflux disease)    Glaucoma    Hypertension    Macular degeneration    Myocardial infarction [facility])    1999   Prostate cancer (HCC) 02/2018   Stroke (HCC)    no deficits; had stroke while trying to place "stents in legs".    Assessment: Patient Reported Symptoms:  Cognitive Cognitive Status: Able to follow simple commands, Poor judgment in daily scenarios, Requires Assistance Decision Making Cognitive/Intellectual Conditions Management [RPT]: Other Other: Wife reports that she is patient decision maker.      Neurological Neurological Review of Symptoms: Not assessed    HEENT HEENT Symptoms Reported: Not assessed      Cardiovascular Cardiovascular Symptoms Reported: Not assessed    Respiratory Respiratory Symptoms Reported: Not assesed    Endocrine Patient reports the following symptoms related to hypoglycemia or hyperglycemia : Not assessed    Gastrointestinal Gastrointestinal Symptoms Reported: Not assessed      Genitourinary Genitourinary Symptoms Reported: Not assessed    Integumentary Integumentary Symptoms Reported: Not assessed    Musculoskeletal Musculoskelatal Symptoms Reviewed: Difficulty  walking Additional Musculoskeletal Details: Patient can be weak after HD. Musculoskeletal Conditions: Joint pain, Mobility limited, Unsteady gait Musculoskeletal Management Strategies: Medical device, Routine screening Falls in the past year?: Yes Number of falls in past year: 1 or less Was there an injury with Fall?: No Fall Risk Category Calculator: 1 Patient Fall Risk Level: Low Fall Risk Patient at Risk for Falls Due to: Impaired balance/gait, Impaired mobility (wife reports that patient fell after HD on 07/20/2023) Fall risk Follow up: Falls evaluation completed  Psychosocial Psychosocial Symptoms Reported: Depression - if selected complete PHQ 2-9 Other Psychosocial Conditions: cognitive impairment Additional Psychological Details: Wife reports that patient can be restless. Behavioral Health Conditions: Depression Behavioral Management Strategies: Support system Major Change/Loss/Stressor/Fears (CP): Medical condition, self Behaviors When Feeling Stressed/Fearful: sleeplessness Quality of Family Relationships: helpful Do you feel physically threatened by others?: No      07/21/2023   10:26 AM  Depression screen PHQ 2/9  Decreased Interest 3  Down, Depressed, Hopeless 1  PHQ - 2 Score 4  Altered sleeping 2  Tired, decreased energy 2  Change in appetite 1  Feeling bad or failure about yourself  0  Trouble concentrating 1  Moving slowly or fidgety/restless 0  Suicidal thoughts 0  PHQ-9 Score 10  Difficult doing work/chores Somewhat difficult    There were no vitals filed for this visit.  Medications Reviewed Today     Reviewed by Festus Hubert (Social Worker) on 07/21/23 at 1024  Med List Status: <None>   Medication Order Taking? Sig Documenting Provider Last Dose Status Informant  amiodarone  (PACERONE ) 200 MG tablet [id number] No Take 1 tablet (200 mg total) by mouth daily. Gonfa, Taye  T, MD Taking Active Spouse/Significant Other, Pharmacy Records  atorvastatin   (LIPITOR) 80 MG tablet [id number] No Take 80 mg by mouth in the morning. [provider] Taking Active Spouse/Significant Other, Pharmacy Records  benzonatate  (TESSALON ) 100 MG capsule [id number] No Take 1 capsule (100 mg total) by mouth 3 (three) times daily as needed for cough.  Patient not taking: Reported on 07/16/2023   Cornelius Dill, DO Not Taking Active            Med Note Parke Boll, Morrell Aran   Tue Jul 14, 2023  3:03 PM) Not really giving to patient.   calcium  acetate (PHOSLO ) 667 MG capsule [id number] No Take 1,334 mg by mouth 3 (three) times daily. citracal [provider] Taking Active Spouse/Significant Other, Pharmacy Records  clopidogrel  (PLAVIX ) 75 MG tablet [id number] No Take 1 tablet (75 mg total) by mouth daily. Deneen Finical, PA-C Taking Active Spouse/Significant Other, Pharmacy Records  metoprolol  succinate (TOPROL  XL) 50 MG 24 hr tablet [id number] No Take 1 tablet (50 mg total) by mouth daily. Take with or immediately following a meal. Baglia, Corrina, PA-C Taking Active Spouse/Significant Other, Pharmacy Records  mirtazapine  (REMERON ) 15 MG tablet [id number] No Take 15 mg by mouth at bedtime. [provider] Taking Active Spouse/Significant Other, Pharmacy Records  pantoprazole  (PROTONIX ) 40 MG tablet [id number] No Take 1 tablet (40 mg total) by mouth 2 (two) times daily for 60 days, THEN 1 tablet (40 mg total) daily. Gonfa, Taye T, MD Taking Active Spouse/Significant Other, Pharmacy Records  polyethylene glycol powder (GLYCOLAX /MIRALAX ) 17 GM/SCOOP powder [id number] No Take 17 g by mouth 2 (two) times daily as needed for mild constipation. Doroteo Gasmen, MD Taking Active Spouse/Significant Other, Pharmacy Records           Med Note Edmond, Curley Double Jul 08, 2023  6:46 PM)    tamsulosin  (FLOMAX ) 0.4 MG CAPS capsule [id number] No Take 1 capsule (0.4 mg total) by mouth daily after supper. McKenzie, Arden Beck, MD Taking Active Spouse/Significant Other,  Pharmacy Records  torsemide  (DEMADEX ) 20 MG tablet [id number]  Take 60 mg by mouth daily. [provider]  Active   torsemide  60 MG TABS [id number] No Take 60 mg by mouth daily.  Patient not taking: Reported on 07/14/2023   Cornelius Dill, DO Not Taking Active            Med Note Parke Boll, Morrell Aran   Tue Jul 14, 2023  3:04 PM) [facility] had to order, waiting on a call back today regarding filling of this medication.   zolpidem  (AMBIEN ) 10 MG tablet [id number] No Take 10 mg by mouth at bedtime as needed for sleep. [provider] Taking Active Spouse/Significant Other, Pharmacy Records            Recommendation:   PCP Follow-up  Follow Up Plan:   Telephone follow up appointment date/time:  07/28/2023 at 10:00 am  Georgine Kitchens, MSW, [employer] [place]  [facility], [facility] Health Licensed Clinical Social Worker Direct Dial: [phone]

## 2023-07-22 ENCOUNTER — Ambulatory Visit (HOSPITAL_COMMUNITY)

## 2023-07-22 DIAGNOSIS — N186 End stage renal disease: Secondary | ICD-10-CM | POA: Diagnosis not present

## 2023-07-22 DIAGNOSIS — Z992 Dependence on renal dialysis: Secondary | ICD-10-CM | POA: Diagnosis not present

## 2023-07-23 ENCOUNTER — Ambulatory Visit (HOSPITAL_COMMUNITY)
Admission: RE | Admit: 2023-07-23 | Discharge: 2023-07-23 | Disposition: A | Discharge status: Home / Self Care | Source: Ambulatory Visit | Attending: Vascular Surgery | Admitting: Vascular Surgery

## 2023-07-23 ENCOUNTER — Other Ambulatory Visit: Payer: Self-pay | Admitting: *Deleted

## 2023-07-23 ENCOUNTER — Encounter: Payer: Self-pay | Admitting: *Deleted

## 2023-07-23 DIAGNOSIS — Z8673 Personal history of transient ischemic attack (TIA), and cerebral infarction without residual deficits: Secondary | ICD-10-CM | POA: Diagnosis not present

## 2023-07-23 DIAGNOSIS — D631 Anemia in chronic kidney disease: Secondary | ICD-10-CM | POA: Diagnosis not present

## 2023-07-23 DIAGNOSIS — Z48812 Encounter for surgical aftercare following surgery on the circulatory system: Secondary | ICD-10-CM | POA: Diagnosis not present

## 2023-07-23 DIAGNOSIS — Z556 Problems related to health literacy: Secondary | ICD-10-CM | POA: Diagnosis not present

## 2023-07-23 DIAGNOSIS — I4811 Longstanding persistent atrial fibrillation: Secondary | ICD-10-CM | POA: Diagnosis not present

## 2023-07-23 DIAGNOSIS — N2581 Secondary hyperparathyroidism of renal origin: Secondary | ICD-10-CM | POA: Diagnosis not present

## 2023-07-23 DIAGNOSIS — I2510 Atherosclerotic heart disease of native coronary artery without angina pectoris: Secondary | ICD-10-CM | POA: Diagnosis not present

## 2023-07-23 DIAGNOSIS — Z7902 Long term (current) use of antithrombotics/antiplatelets: Secondary | ICD-10-CM | POA: Diagnosis not present

## 2023-07-23 DIAGNOSIS — Z7982 Long term (current) use of aspirin: Secondary | ICD-10-CM | POA: Diagnosis not present

## 2023-07-23 DIAGNOSIS — I252 Old myocardial infarction: Secondary | ICD-10-CM | POA: Diagnosis not present

## 2023-07-23 DIAGNOSIS — H3530 Unspecified macular degeneration: Secondary | ICD-10-CM | POA: Diagnosis not present

## 2023-07-23 DIAGNOSIS — H409 Unspecified glaucoma: Secondary | ICD-10-CM | POA: Diagnosis not present

## 2023-07-23 DIAGNOSIS — I7143 Infrarenal abdominal aortic aneurysm, without rupture: Secondary | ICD-10-CM | POA: Diagnosis not present

## 2023-07-23 DIAGNOSIS — J449 Chronic obstructive pulmonary disease, unspecified: Secondary | ICD-10-CM | POA: Diagnosis not present

## 2023-07-23 DIAGNOSIS — G894 Chronic pain syndrome: Secondary | ICD-10-CM | POA: Diagnosis not present

## 2023-07-23 DIAGNOSIS — N3289 Other specified disorders of bladder: Secondary | ICD-10-CM | POA: Diagnosis not present

## 2023-07-23 DIAGNOSIS — E782 Mixed hyperlipidemia: Secondary | ICD-10-CM | POA: Diagnosis not present

## 2023-07-23 DIAGNOSIS — N280 Ischemia and infarction of kidney: Secondary | ICD-10-CM | POA: Diagnosis not present

## 2023-07-23 DIAGNOSIS — N186 End stage renal disease: Secondary | ICD-10-CM | POA: Diagnosis not present

## 2023-07-23 DIAGNOSIS — Z992 Dependence on renal dialysis: Secondary | ICD-10-CM | POA: Diagnosis not present

## 2023-07-23 DIAGNOSIS — I5022 Chronic systolic (congestive) heart failure: Secondary | ICD-10-CM | POA: Diagnosis not present

## 2023-07-23 DIAGNOSIS — S51812D Laceration without foreign body of left forearm, subsequent encounter: Secondary | ICD-10-CM | POA: Diagnosis not present

## 2023-07-23 DIAGNOSIS — K219 Gastro-esophageal reflux disease without esophagitis: Secondary | ICD-10-CM | POA: Diagnosis not present

## 2023-07-23 DIAGNOSIS — I132 Hypertensive heart and chronic kidney disease with heart failure and with stage 5 chronic kidney disease, or end stage renal disease: Secondary | ICD-10-CM | POA: Diagnosis not present

## 2023-07-23 MED ORDER — IOHEXOL 350 MG/ML SOLN
100.0000 mL | Freq: Once | INTRAVENOUS | Status: AC | PRN
Start: 2023-07-23 — End: 2023-07-23
  Administered 2023-07-23: 100 mL via INTRAVENOUS

## 2023-07-23 MED ORDER — IOHEXOL 300 MG/ML  SOLN: 100.0000 mL | Freq: Once | INTRAMUSCULAR | Status: DC | PRN

## 2023-07-23 NOTE — Patient Outreach (Signed)
 Complex Care Management   Visit Note  07/23/2023  Name:  Ronald [name] MRN: [id number] DOB: 08-15-1940  Situation: Referral received for Complex Care Management related to Heart Failure and ESRD I obtained verbal consent from Caregiver.  Visit completed with [name], wife  on the phone  [name] confirmed the patient was only able to tolerate his HD for an half an hour on Monday 07/20/23 Today 07/23/23 much better walking, without walker in the home with her present. Each day is different Reports was in bad shape on 07/22/23  Marvell Slider on Monday 07/20/23 after his 3.5 hour HD session She reports she was informed that the patient fell outside (not using his walker)while leaving [facility]. He had his walker but had left it in [facility] dialysis center.Jayme Meuse report she was informed when he arrived home he had no assistance from staff when leaving out he the center. The transportation lady informed [name] she helped to get him up, take him to the car and transported him home. The wife was not present during the fall. Wife feels this was not acceptable and reports she was very upset.  Confirmed received medications. Taking them but without decreased swelling of legs Used socks but compression does not fit, too small [facility] RN will be visiting today not hospice HH PT did a home  Background:   Past Medical History:  Diagnosis Date   CHF (congestive heart failure) (HCC)    ESRD on hemodialysis (HCC)    GERD (gastroesophageal reflux disease)    Glaucoma    Hypertension    Macular degeneration    Myocardial infarction (HCC)    1999   Prostate cancer (HCC) 02/2018   Stroke (HCC)    no deficits; had stroke while trying to place stents in legs.    Assessment: Patient Reported Symptoms:  Cognitive Cognitive Status: Able to follow simple commands, Requires Assistance Decision Making Other: wife reports today 07/23/23 that Mr Halderman is much better He is reported  to be walking on his own without his walker throughout the house vs her having to lift up on  him. They are planing to attend a diagnostic office visit locally      Neurological Neurological Review of Symptoms: No symptoms reported Oher Neurological Symptoms/Conditions [RPT]: reports patient has good and bad days 07/23/23 is a good day. 07/22/23 was a bad day 07/21/23 was a good day Neurological Self-Management Outcome: 3 (uncertain)  HEENT HEENT Symptoms Reported: No symptoms reported HEENT Self-Management Outcome: 4 (good)    Cardiovascular Cardiovascular Symptoms Reported: Swelling in legs or feet Other Cardiovascular Symptoms: Has Torsemide , taking it but reports continued swelling even after HD & diuretic Does patient have uncontrolled Hypertension?: No Cardiovascular Conditions: Coronary artery disease, Dysrhythmia, Heart failure, High blood cholesterol, Hypertension, Myocardial infarction Cardiovascular Management Strategies: Activity, Adequate rest, Medical device, Medication therapy, Routine screening Cardiovascular Self-Management Outcome: 3 (uncertain)  Respiratory   Respiratory Self-Management Outcome: 4 (good) Respiratory Comment: no symptoms reported per wife  Endocrine Patient reports the following symptoms related to hypoglycemia or hyperglycemia : Weakness or fatigue Is patient diabetic?: No Endocrine Conditions: Parathyroid disorder Endocrine Management Strategies: Activity, Adequate rest, Medication therapy, Medical device, Routine screening Endocrine Self-Management Outcome: 4 (good) Endocrine Comment: weakness after HD session 07/20/23  Gastrointestinal Gastrointestinal Symptoms Reported: No symptoms reported Gastrointestinal Conditions: Abdominal pain, Bleeding, Diarrhea, Nausea, Reflux/heartburn Gastrointestinal Management Strategies: Activity, Adequate rest, Fluid modification, Incontinence garment/pad Gastrointestinal Self-Management Outcome: 4 (good) Nutrition Risk  Screen (CP): No indicators present  Genitourinary Genitourinary Symptoms Reported: Other Additional Genitourinary Details: Marvell Slider on Monday 07/20/23 after his 3.5 hour HD session when walking out of HD builder without assistance & without his walker Genitourinary Conditions: Chronic kidney disease, End-stage renal disease Genitourinary Management Strategies: Adequate rest, Activity, Hemodialysis, Incontinence garment/pad, Medical device, Medication therapy Hemodialysis Last Treatment: 07/20/23 Genitourinary Self-Management Outcome: 3 (uncertain)  Integumentary Integumentary Symptoms Reported: No symptoms reported Other Integumentary Symptoms: no wounds, bruses reported by wife when assessed - no skin changes after fall on 07/20/23 Skin Conditions: Other Other Skin Conditions: hx of skin lesion Skin Management Strategies: Activity, Adequate rest, Routine screening Skin Self-Management Outcome: 4 (good)  Musculoskeletal Musculoskelatal Symptoms Reviewed: Unsteady gait, No symptoms reported, Weakness Additional Musculoskeletal Details: Walked partially well on 07/20/23 until he fell after HD, Tuesday & wednesday reported poor mobility. Today 07/23/23 minimal walking concerns as he walks in home without walker & wife present Musculoskeletal Conditions: Mobility limited, Unsteady gait, Joint pain Musculoskeletal Management Strategies: Activity, Adequate rest, Medical device, Routine screening Musculoskeletal Self-Management Outcome: 3 (uncertain) Falls in the past year?: Yes Number of falls in past year: 2 or more Was there an injury with Fall?: No Fall Risk Category Calculator: 2 Patient Fall Risk Level: Moderate Fall Risk Patient at Risk for Falls Due to: History of fall(s), Impaired vision, Impaired balance/gait, Impaired mobility, Mental status change Fall risk Follow up: Falls evaluation completed, Falls prevention discussed  Psychosocial Psychosocial Symptoms Reported: No symptoms  reported Behavioral Health Self-Management Outcome: 4 (good)          07/23/2023    7:33 PM  Depression screen PHQ 2/9  Decreased Interest 1  Down, Depressed, Hopeless 0  PHQ - 2 Score 1    There were no vitals filed for this visit.  Medications Reviewed Today   Medications were not reviewed in this encounter     Recommendation:   Diagnostic requests: on 07/23/23  Continue Current Plan of Care  Follow Up Plan:   Telephone follow up appointment date/time:  07/27/23 3:30 pm  Jullie Oiler L. Mcarthur Speedy, RN, BSN, CCM [place]  [facility], [facility] Health RN Care Manager Direct Dial: [phone]  Fax: [phone]

## 2023-07-23 NOTE — Addendum Note (Signed)
 Addended by: Arlyce Berger on: 07/23/2023 08:03 PM   Modules accepted: Orders

## 2023-07-23 NOTE — Patient Instructions (Signed)
 Visit Information  Thank you for taking time to visit with me today. Please don't hesitate to contact me if I can be of assistance to you before our next scheduled appointment.  Your next care management appointment is by telephone on 07/27/23 at 3:30 pm  Remember to call to report any falls, injuries  Please call the care guide team at [phone] if you need to cancel, schedule, or reschedule an appointment.   Please call the Suicide and Crisis Lifeline: 988 call the [place]  National Suicide Prevention Lifeline: 4181566187 or TTY: 985-074-6002 TTY (352)405-0508) to talk to a trained counselor call 1-800-273-TALK (toll free, 24 hour hotline) call the [facility]: [phone] call 911 if you are experiencing a Mental Health or Behavioral Health Crisis or need someone to talk to.  Bryahna Lesko L. Mcarthur Speedy, RN, BSN, CCM [place]  [facility], [facility] Health RN Care Manager Direct Dial: [phone]  Fax: [phone]

## 2023-07-23 NOTE — Patient Outreach (Signed)
 Eror with opening this encounter Unsuccessful outreach  [employer]. Mcarthur Speedy, RN, BSN, CCM [place]  [facility], [facility] Health RN Care Manager Direct Dial: [phone]  Fax: [phone]

## 2023-07-24 ENCOUNTER — Telehealth: Payer: Self-pay | Admitting: *Deleted

## 2023-07-24 NOTE — Patient Outreach (Signed)
 Collaboation with [facility]  Incoming call from [place] to update and review the below confirmations during the visit with this RN CM  Odilia Bennett confirms a completed [facility] home visit with the patient, wife-[name] and daughter, Shelda Destine (home for a brief visit)   Odilia Bennett confirms review of palliative & hospice services in detail and criteria for home hospice services and hospice facility placement.   Odilia Bennett confirmed the patient and family want Mr Burges to remain at home and not in any facility.  Odilia Bennett confirmed The family and patient spoke in their native language and Mr Nissley confirmed he did want to continue dialysis when possible but did not want to go to the hospital nor other facilities.  Odilia Bennett confirmed Mr Magallon was not able to make it to his Friday 07/24/23 Dialysis session related to pain, not being about to tolerate sitting for 3/5 hour session.    While Odilia Bennett was present she observed the patient grimacing and groaning with abdominal pain and reported weakness. She noted Jayme Meuse spoke more and provided majority of the response even when consulting with patient and daughter  At the end of the session, Odilia Bennett confirmed he patient nor family has not agreed to hospice services, nor discontinuing dialysis. She did confirm the family was considering taking the patient to the ED for his pain, missed dialysis She confirmed her contact information was left for a return call to her and encouraged them to call if other plan of care goal decisions have been agreed upon  Plan  Continue present treatment  Encouaged attendance to Dialysis as much as possible throughout the week especially on Mondays & Fridays. Continue to outreach as much as possible to provider support to the patient, caregiver, [name], and the family- next follow up 07/27/23  Continue to answer questions, review services & resources  Continue to collaborate with all patient's medical staff Forward this  not to pcp & specialists, dialysis center as discussed with Samule Crofts L. Mcarthur Speedy, RN, BSN, CCM [place]  [facility], [facility] Health RN Care Manager Direct Dial: [phone]  Fax: [phone]

## 2023-07-27 ENCOUNTER — Other Ambulatory Visit: Payer: Self-pay | Admitting: *Deleted

## 2023-07-27 NOTE — Patient Outreach (Signed)
 Complex Care Management   Visit Note  07/28/2023 updated note for 07/27/23  Name:  Ronald [name] MRN: [id number] DOB: Jun 16, 1940  Situation: Referral received for Complex Care Management related to Heart Failure and ESRD I obtained verbal consent from Caregiver.  Visit completed with his wife Ronald [name]  on the phone He was present in the room and answered a few questions  Did not attend Hemodialysis (HD) today Wife reports the patient is Not in any shape to go, does not feel well reports concerns with pain of chest, /groin/perineal area, feet, legs  Completed CT on 07/24/23 - go to see Dr Fulton Job to review 07/28/23 1420  Patient was given the diagnosis cognitive impairment on 07/13/2021 by Tex Filbert   [facility] PT, Annettem visited on Thursday 07/23/23 and placed a call to the pcp office to request advise to manage his noted wheezing, severe congestion, lack of sleep due to congestion and bilateral lower leg edema PCP encouraged outreach to palliative care MD  Jayme Meuse confirms that the patient's legs remain swollen even after Hemodialysis (HD) sessions.  Wife reports understanding that palliative MD will be outreached and patient may need legs examined for other medical issues like cellulitis, lymphedema.    Jayme Meuse voiced understanding when all of this was reviewed with her     Background:   Past Medical History:  Diagnosis Date   CHF (congestive heart failure) (HCC)    ESRD on hemodialysis (HCC)    GERD (gastroesophageal reflux disease)    Glaucoma    Hypertension    Macular degeneration    Myocardial infarction [facility])    1999   Prostate cancer (HCC) 02/2018   Stroke (HCC)    no deficits; had stroke while trying to place stents in legs.    Assessment: Patient Reported Symptoms:  Cognitive Cognitive Status: Able to follow simple commands, Confused or disoriented, Poor judgment in daily scenarios, Requires Assistance Decision Making      Neurological  Neurological Review of Symptoms: Weakness Oher Neurological Symptoms/Conditions [RPT]: Did not attend HD did not feel good Neurological Management Strategies: Adequate rest, Medication therapy Neurological Self-Management Outcome: 3 (uncertain)  HEENT HEENT Symptoms Reported: No symptoms reported HEENT Self-Management Outcome: 4 (good)    Cardiovascular Cardiovascular Symptoms Reported: Swelling in legs or feet, Fatigue, Chest pain or discomfort Does patient have uncontrolled Hypertension?: No Cardiovascular Conditions: Coronary artery disease, Dysrhythmia, Heart failure, High blood cholesterol, Hypertension, Myocardial infarction Cardiovascular Management Strategies: Activity, Adequate rest, Medical device, Medication therapy, Routine screening Cardiovascular Self-Management Outcome: 3 (uncertain)  Respiratory Respiratory Symptoms Reported: Shortness of breath, Wheezing Respiratory Conditions: COPD Respiratory Self-Management Outcome: 3 (uncertain)  Endocrine Patient reports the following symptoms related to hypoglycemia or hyperglycemia : Weakness or fatigue Is patient diabetic?: No Endocrine Conditions: Parathyroid disorder Endocrine Management Strategies: Adequate rest Endocrine Self-Management Outcome: 4 (good)  Gastrointestinal Gastrointestinal Symptoms Reported: Abdominal pain or discomfort Gastrointestinal Conditions: Abdominal pain, Bleeding, Diarrhea, Nausea, Reflux/heartburn Gastrointestinal Management Strategies: Adequate rest, Diet modification, Incontinence garment/pad, Medication therapy Gastrointestinal Self-Management Outcome: 3 (uncertain) Nutrition Risk Screen (CP): No indicators present  Genitourinary Genitourinary Symptoms Reported: Frequency Genitourinary Conditions: Chronic kidney disease, End-stage renal disease Genitourinary Management Strategies: Adequate rest, Diet modification, Fluid modification, Incontinence garment/pad Hemodialysis Schedule: missed HD  session on 07/24/23 was only able to tolerate HD for about an hour on week of 07/20/23 to 07/24/23 Hemodialysis Last Treatment: 07/20/23 Genitourinary Self-Management Outcome: 2 (bad)  Integumentary Integumentary Symptoms Reported: No symptoms reported Skin Self-Management Outcome: 4 (good)  Musculoskeletal Musculoskelatal Symptoms  Reviewed: Difficulty walking, Unsteady gait, Weakness Musculoskeletal Conditions: Joint pain, Mobility limited, Unsteady gait Musculoskeletal Management Strategies: Adequate rest, Activity, Medical device Musculoskeletal Self-Management Outcome: 3 (uncertain)      Psychosocial Psychosocial Symptoms Reported: Other Other Psychosocial Conditions: cognitive impairment & agitation Behavioral Health Conditions: Depression, Other Other Behavorial Health Conditions: moderate cognitive impairment Behavioral Management Strategies: Support system Behavioral Health Self-Management Outcome: 3 (uncertain)          07/23/2023    7:33 PM  Depression screen PHQ 2/9  Decreased Interest 1  Down, Depressed, Hopeless 0  PHQ - 2 Score 1    There were no vitals filed for this visit.  Medications Reviewed Today   Medications were not reviewed in this encounter     Recommendation:   Continue Current Plan of Care  Follow Up Plan:   Telephone follow up appointment date/time:  07/31/23 3:30 pm  Jullie Oiler L. Mcarthur Speedy, RN, BSN, CCM [place]  [facility], [facility] Health RN Care Manager Direct Dial: [phone]  Fax: [phone]

## 2023-07-28 ENCOUNTER — Telehealth: Payer: Self-pay | Admitting: *Deleted

## 2023-07-28 ENCOUNTER — Ambulatory Visit: Admitting: Vascular Surgery

## 2023-07-28 ENCOUNTER — Encounter: Payer: Self-pay | Admitting: Vascular Surgery

## 2023-07-28 VITALS — BP 120/62 | HR 68 | Temp 97.4°F | Resp 20 | Ht 74.0 in | Wt 210.0 lb

## 2023-07-28 DIAGNOSIS — I7143 Infrarenal abdominal aortic aneurysm, without rupture: Secondary | ICD-10-CM

## 2023-07-28 NOTE — Progress Notes (Signed)
 Patient name: Ronald [name] MRN: [id number] DOB: Aug 04, 1940 Sex: male  REASON FOR CONSULT: 1 month follow-up with CT after physician modified endograft for AAA  HPI: Ronald [name] is a 83 y.o. male, the presents for hospital follow-up with CT after custom modified endograft for treatment of abdominal aortic aneurysm.  Recently underwent physician modified endograft on 06/17/2023 with an SMA scallop and this also involved right external iliac stent with endarterectomy of the right common femoral artery and SFA.  This was for a now 13 cm AAA.  His groin is healed.  Wife states has been missing dialysis sessions and has not felt well.  Lower extremity edema.  Previous EVAR in [place] in 2013 for 6.4 cm AAA.  Past Medical History:  Diagnosis Date   CHF (congestive heart failure) (HCC)    ESRD on hemodialysis (HCC)    GERD (gastroesophageal reflux disease)    Glaucoma    Hypertension    Macular degeneration    Myocardial infarction [facility])    1999   Prostate cancer (HCC) 02/2018   Stroke (HCC)    no deficits; had stroke while trying to place stents in legs.    Past Surgical History:  Procedure Laterality Date   ABDOMINAL AORTIC ANEURYSM REPAIR  2012   IN [place]    ABDOMINAL AORTIC ENDOVASCULAR STENT GRAFT N/A 06/17/2023   Procedure: INSERTION, ENDOVASCULAR STENT GRAFT, AORTA, ABDOMINAL;  Surgeon: Young Hensen, MD;  Location: [facility] OR;  Service: Vascular;  Laterality: N/A;   APPENDECTOMY     AV FISTULA PLACEMENT Left 10/16/2020   Procedure: LEFT ARM ARTERIOVENOUS (AV) FISTULA CREATION;  Surgeon: Mayo Speck, MD;  Location: [facility] ORS;  Service: Vascular;  Laterality: Left;   CORONARY ARTERY BYPASS GRAFT  01/1998   3 vessels 18 years ago   ENDARTERECTOMY FEMORAL Right 06/17/2023   Procedure: ENDARTERECTOMY, COMMON FEMORAL AND SUPERFICIAL FEMORAL;  Surgeon: Young Hensen, MD;  Location: [facility] OR;  Service: Vascular;  Laterality: Right;   INGUINAL HERNIA REPAIR Right  09/20/2015   Procedure: REPAIR RIGHT INGUINAL HERNIA  WITH MESH;  Surgeon: Franki Isles, MD;  Location: [facility] ORS;  Service: General;  Laterality: Right;   INSERTION OF DIALYSIS CATHETER Right 08/02/2020   Procedure: INSERTION OF RIGHT TUNNELED INTERNAL JUGULAR  DIALYSIS CATHETER;  Surgeon: Mayo Speck, MD;  Location: [facility]S;  Service: Vascular;  Laterality: Right;   INSERTION OF ILIAC STENT Right 06/17/2023   Procedure: INSERTION, STENT, ARTERY, ILIAC;  Surgeon: Young Hensen, MD;  Location: [facility] OR;  Service: Vascular;  Laterality: Right;   LOWER EXTREMITY ANGIOGRAM Right 06/17/2023   Procedure: [employer], LOWER EXTREMITY;  Surgeon: Young Hensen, MD;  Location: [facility] OR;  Service: Vascular;  Laterality: Right;   PATCH ANGIOPLASTY Right 06/17/2023   Procedure: ANGIOPLASTY, USING PATCH GRAFT USING Angelyn Barges;  Surgeon: Young Hensen, MD;  [facility];  Service: Vascular;  Laterality: Right;   REMOVAL OF A DIALYSIS CATHETER N/A 03/19/2021   Procedure: MINOR REMOVAL OF A TUNNELED DIALYSIS CATHETER;  Surgeon: Mayo Speck, MD;  Location: [facility];  Service: Vascular;  Laterality: N/A;   tumor removal from bladder     ULTRASOUND GUIDANCE FOR VASCULAR ACCESS Bilateral 06/17/2023   Procedure: ULTRASOUND GUIDANCE, FOR VASCULAR ACCESS;  Surgeon: Young Hensen, MD;  Location: [facility] OR;  Service: Vascular;  Laterality: Bilateral;    Family History  Problem Relation Age of Onset   Cancer Father     SOCIAL  HISTORY: Social History   Socioeconomic History   Marital status: Married    Spouse name: Not on file   Number of children: Not on file   Years of education: Not on file   Highest education level: Not on file  Occupational History   Not on file  Tobacco Use   Smoking status: Former    Current packs/day: 0.50    Average packs/day: 0.5 packs/day for 66.0 years (33.0 ttl pk-yrs)    Types: Cigarettes    Start date: 08/07/1957   Smokeless tobacco: Never   Tobacco comments:     on chantix now  Vaping Use   Vaping status: Never Used  Substance and Sexual Activity   Alcohol use: No   Drug use: No   Sexual activity: Yes  Other Topics Concern   Not on file  Social History Narrative   Right handed   Drinks caffeine   One story home   Social Drivers of Health   Financial Resource Strain: Low Risk  (07/07/2023)   Overall Financial Resource Strain (CARDIA)    Difficulty of Paying Living Expenses: Not very hard  Food Insecurity: Food Insecurity Present (07/14/2023)   Hunger Vital Sign    Worried About Running Out of Food in the Last Year: Never true    Ran Out of Food in the Last Year: Sometimes true  Transportation Needs: No Transportation Needs (07/14/2023)   PRAPARE - Administrator, Civil Service (Medical): No    Lack of Transportation (Non-Medical): No  Physical Activity: Inactive (07/07/2023)   Exercise Vital Sign    Days of Exercise per Week: 0 days    Minutes of Exercise per Session: 0 min  Stress: No Stress Concern Present (07/07/2023)   [employer] of Occupational Health - Occupational Stress Questionnaire    Feeling of Stress : Only a little  Social Connections: Unknown (07/14/2023)   Social Connection and Isolation Panel    Frequency of Communication with Friends and Family: Patient unable to answer    Frequency of Social Gatherings with Friends and Family: Patient unable to answer    Attends Religious Services: Patient unable to answer    Active Member of Clubs or Organizations: Patient unable to answer    Attends Banker Meetings: Patient unable to answer    Marital Status: Married  Catering manager Violence: Patient Unable To Answer (07/14/2023)   Humiliation, Afraid, Rape, and Kick questionnaire    Fear of Current or Ex-Partner: Patient unable to answer    Emotionally Abused: Patient unable to answer    Physically Abused: Patient unable to answer    Sexually Abused: Patient unable to answer    No Known  Allergies  Current Outpatient Medications  Medication Sig Dispense Refill   amiodarone  (PACERONE ) 200 MG tablet Take 1 tablet (200 mg total) by mouth daily. 90 tablet 0   atorvastatin  (LIPITOR) 80 MG tablet Take 80 mg by mouth in the morning.     benzonatate  (TESSALON ) 100 MG capsule Take 1 capsule (100 mg total) by mouth 3 (three) times daily as needed for cough. 20 capsule 0   calcium  acetate (PHOSLO ) 667 MG capsule Take 1,334 mg by mouth 3 (three) times daily. citracal     clopidogrel  (PLAVIX ) 75 MG tablet Take 1 tablet (75 mg total) by mouth daily. 30 tablet 11   metoprolol  succinate (TOPROL -XL) 25 MG 24 hr tablet Take 25 mg by mouth daily.     mirtazapine  (REMERON ) 15 MG tablet  Take 15 mg by mouth at bedtime.     pantoprazole  (PROTONIX ) 40 MG tablet Take 1 tablet (40 mg total) by mouth 2 (two) times daily for 60 days, THEN 1 tablet (40 mg total) daily. 150 tablet 0   polyethylene glycol powder (GLYCOLAX /MIRALAX ) 17 GM/SCOOP powder Take 17 g by mouth 2 (two) times daily as needed for mild constipation. 238 g 0   tamsulosin  (FLOMAX ) 0.4 MG CAPS capsule Take 1 capsule (0.4 mg total) by mouth daily after supper. 90 capsule 3   torsemide  60 MG TABS Take 60 mg by mouth daily. 30 tablet 1   zolpidem  (AMBIEN ) 10 MG tablet Take 10 mg by mouth at bedtime as needed for sleep.     metoprolol  succinate (TOPROL  XL) 50 MG 24 hr tablet Take 1 tablet (50 mg total) by mouth daily. Take with or immediately following a meal. (Patient not taking: Reported on 07/28/2023) 30 tablet 11   torsemide  (DEMADEX ) 20 MG tablet Take 60 mg by mouth daily. (Patient not taking: Reported on 07/28/2023)     No current facility-administered medications for this visit.    REVIEW OF SYSTEMS:  [X]  denotes positive finding, [ ]  denotes negative finding Cardiac  Comments:  Chest pain or chest pressure:    Shortness of breath upon exertion:    Short of breath when lying flat:    Irregular heart rhythm:        Vascular    Pain  in calf, thigh, or hip brought on by ambulation:    Pain in feet at night that wakes you up from your sleep:     Blood clot in your veins:    Leg swelling:         Pulmonary    Oxygen at home:    Productive cough:     Wheezing:         Neurologic    Sudden weakness in arms or legs:     Sudden numbness in arms or legs:     Sudden onset of difficulty speaking or slurred speech:    Temporary loss of vision in one eye:     Problems with dizziness:         Gastrointestinal    Blood in stool:     Vomited blood:         Genitourinary    Burning when urinating:     Blood in urine:        Psychiatric    Major depression:         Hematologic    Bleeding problems:    Problems with blood clotting too easily:        Skin    Rashes or ulcers:        Constitutional    Fever or chills:      PHYSICAL EXAM: Vitals:   07/28/23 1412  BP: 120/62  Pulse: 68  Resp: 20  Temp: (!) 97.4 F (36.3 C)  TempSrc: Temporal  SpO2: 95%  Weight: 210 lb (95.3 kg)  Height: 6' 2 (1.88 m)    GENERAL: The patient is a well-nourished male, in no acute distress. The vital signs are documented above. CARDIAC: There is a regular rate and rhythm.  VASCULAR:  Right groin incision well-healed Brisk PT Doppler signals bilaterally PULMONARY: No respiratory distress. ABDOMEN: Soft and non-tender. MUSCULOSKELETAL: There are no major deformities or cyanosis. NEUROLOGIC: No focal weakness or paresthesias are detected. PSYCHIATRIC: The patient has a normal affect.  DATA:   CTA  reviewed from 07/23/2023 with endograft in good position with SMA scallop and filling of SMA, no type 1 A endoleak  Assessment/Plan:  83 y.o. male, the presents for hospital follow-up with CT after custom modified endograft for treatment of abdominal aortic aneurysm.  Recently underwent physician modified endograft on 06/17/2023 with an SMA scallop and this also involved right external iliac stent with endarterectomy of the right  common femoral artery and SFA for a 13 cm AAA. Previous EVAR in [place] in 2013 for 6.4 cm AAA with ongoing type 1 A endoleak and growing aneurysm.  Reviewed most recent CTA from 07/23/2023 and endograft is in good position.  There is filling of the SMA around the scallop.  We covered the renal arteries in setting of ESRD.  I do not see a type Ia endoleak anymore.  Pleased with his CT results as we discussed today.  I will see him again in 6 months with CTA and follow-up in [place].   Young Hensen, MD [facility] Office: [phone]

## 2023-07-28 NOTE — Patient Outreach (Signed)
 Transportation assistance to Dr Fulton Job on 07/28/23  Received a call from [name] informing RN CM the ride to Dr Fulton Job had been cancelled by Safe ride provider at [phone] ([facility] [employer]-[facility] ) after she called to cancel his 07/27/23 dialysis rides  Discussed with her emergency rides via [employer] safe ride program via car/uber, etc.   Conference call to [place] [facility] with [name].  Conference call to the number provided by Shelvy Dickens for safe ride. It was incorrect  Received correct [employer] safe ride number  RN CM encouraged disconnecting with the patient and wife when she reported he was beginning to become agitated with hearing the conference call conversations.  RN CM scheduled an emergency ride to Dr Fulton Job with Fabio Holts at [phone] Patient to be picked up at 125 pm 07/28/23 to transport to MD office and a return home pick up at 3:15 pm. [name] updated and appreciative. She will keep RN CM updated   Ewart Carrera L. Mcarthur Speedy, RN, BSN, CCM [place]  [facility], [facility] RN Care Manager Direct Dial: [phone]  Fax: [phone]

## 2023-07-28 NOTE — Patient Instructions (Signed)
 Visit Information  Thank you for taking time to visit with me today. Please don't hesitate to contact me if I can be of assistance to you before our next scheduled appointment.  Your next care management appointment is by telephone on 07/31/23 at 3:30 pm  Please attend Hemodialys sessions and stay in contact with [facility]  Please call the care guide team at [phone] if you need to cancel, schedule, or reschedule an appointment.   Please call the Suicide and Crisis Lifeline: 988 call the [place]  National Suicide Prevention Lifeline: 337-561-6397 or TTY: (941)251-1050 TTY 617-241-9041) to talk to a trained counselor call 1-800-273-TALK (toll free, 24 hour hotline) call the [facility]: 807-595-1349 call 911 if you are experiencing a Mental Health or Behavioral Health Crisis or need someone to talk to.  Alyza Artiaga L. Mcarthur Speedy, RN, BSN, CCM [place]  [facility], [facility] Health RN Care Manager Direct Dial: [phone]  Fax: [phone]

## 2023-07-29 DIAGNOSIS — Z992 Dependence on renal dialysis: Secondary | ICD-10-CM | POA: Diagnosis not present

## 2023-07-29 DIAGNOSIS — N186 End stage renal disease: Secondary | ICD-10-CM | POA: Diagnosis not present

## 2023-07-30 ENCOUNTER — Other Ambulatory Visit: Payer: Self-pay

## 2023-07-30 ENCOUNTER — Telehealth: Payer: Self-pay

## 2023-07-30 DIAGNOSIS — Z7902 Long term (current) use of antithrombotics/antiplatelets: Secondary | ICD-10-CM | POA: Diagnosis not present

## 2023-07-30 DIAGNOSIS — N186 End stage renal disease: Secondary | ICD-10-CM | POA: Diagnosis not present

## 2023-07-30 DIAGNOSIS — G894 Chronic pain syndrome: Secondary | ICD-10-CM | POA: Diagnosis not present

## 2023-07-30 DIAGNOSIS — E875 Hyperkalemia: Secondary | ICD-10-CM | POA: Diagnosis not present

## 2023-07-30 DIAGNOSIS — J449 Chronic obstructive pulmonary disease, unspecified: Secondary | ICD-10-CM | POA: Diagnosis not present

## 2023-07-30 DIAGNOSIS — Z5982 Transportation insecurity: Secondary | ICD-10-CM | POA: Diagnosis not present

## 2023-07-30 DIAGNOSIS — Z8673 Personal history of transient ischemic attack (TIA), and cerebral infarction without residual deficits: Secondary | ICD-10-CM | POA: Diagnosis not present

## 2023-07-30 DIAGNOSIS — H3530 Unspecified macular degeneration: Secondary | ICD-10-CM | POA: Diagnosis not present

## 2023-07-30 DIAGNOSIS — I350 Nonrheumatic aortic (valve) stenosis: Secondary | ICD-10-CM | POA: Diagnosis not present

## 2023-07-30 DIAGNOSIS — H409 Unspecified glaucoma: Secondary | ICD-10-CM | POA: Diagnosis not present

## 2023-07-30 DIAGNOSIS — Z556 Problems related to health literacy: Secondary | ICD-10-CM | POA: Diagnosis not present

## 2023-07-30 DIAGNOSIS — E782 Mixed hyperlipidemia: Secondary | ICD-10-CM | POA: Diagnosis not present

## 2023-07-30 DIAGNOSIS — Z48812 Encounter for surgical aftercare following surgery on the circulatory system: Secondary | ICD-10-CM | POA: Diagnosis not present

## 2023-07-30 DIAGNOSIS — K219 Gastro-esophageal reflux disease without esophagitis: Secondary | ICD-10-CM | POA: Diagnosis not present

## 2023-07-30 DIAGNOSIS — Z7982 Long term (current) use of aspirin: Secondary | ICD-10-CM | POA: Diagnosis not present

## 2023-07-30 DIAGNOSIS — I4811 Longstanding persistent atrial fibrillation: Secondary | ICD-10-CM | POA: Diagnosis not present

## 2023-07-30 DIAGNOSIS — K922 Gastrointestinal hemorrhage, unspecified: Secondary | ICD-10-CM | POA: Diagnosis not present

## 2023-07-30 DIAGNOSIS — N2581 Secondary hyperparathyroidism of renal origin: Secondary | ICD-10-CM | POA: Diagnosis not present

## 2023-07-30 DIAGNOSIS — I5022 Chronic systolic (congestive) heart failure: Secondary | ICD-10-CM | POA: Diagnosis not present

## 2023-07-30 DIAGNOSIS — S51812D Laceration without foreign body of left forearm, subsequent encounter: Secondary | ICD-10-CM | POA: Diagnosis not present

## 2023-07-30 DIAGNOSIS — I132 Hypertensive heart and chronic kidney disease with heart failure and with stage 5 chronic kidney disease, or end stage renal disease: Secondary | ICD-10-CM | POA: Diagnosis not present

## 2023-07-30 DIAGNOSIS — I2510 Atherosclerotic heart disease of native coronary artery without angina pectoris: Secondary | ICD-10-CM | POA: Diagnosis not present

## 2023-07-30 DIAGNOSIS — I252 Old myocardial infarction: Secondary | ICD-10-CM | POA: Diagnosis not present

## 2023-07-30 DIAGNOSIS — Z992 Dependence on renal dialysis: Secondary | ICD-10-CM | POA: Diagnosis not present

## 2023-07-30 DIAGNOSIS — D631 Anemia in chronic kidney disease: Secondary | ICD-10-CM | POA: Diagnosis not present

## 2023-07-30 DIAGNOSIS — G3184 Mild cognitive impairment, so stated: Secondary | ICD-10-CM | POA: Diagnosis not present

## 2023-07-30 NOTE — Patient Outreach (Signed)
  LCSW called patient's spouse back to inform her that MD Del Favia has been alerted about getting more sleep medication. Spouse reports that the current medication is not working. LCSW followed up with spouse about patient's short dialysis stay yesterday. Spouse reports that patient accidentally taken out the needle therefore it ended.   Georgine Kitchens, MSW, LCSW [place]  [facility], [facility] Health Licensed Clinical Social Worker Direct Dial: [phone]

## 2023-07-30 NOTE — Patient Outreach (Signed)
 Complex Care Management   Visit Note  07/30/2023  Name:  Ronald [name] MRN: [id number] DOB: 10/11/1940  Situation: Referral received for Complex Care Management related to Mental/Behavioral Health diagnosis depression. I obtained verbal consent from Caregiver.  Visit completed with caregiver  on the phone. LCSW completed follow up visit to verify again that caregiver did not want to proceed with resources for patient's depression as well as caregiver stress. Caregiver has declined resources at this time. Caregiver reports patient is restless and is requesting medication. MD Del Favia alerted.LCSW will be closing out care plan. No further follow up needed.  Background:   Past Medical History:  Diagnosis Date   CHF (congestive heart failure) (HCC)    ESRD on hemodialysis (HCC)    GERD (gastroesophageal reflux disease)    Glaucoma    Hypertension    Macular degeneration    Myocardial infarction [facility])    1999   Prostate cancer (HCC) 02/2018   Stroke (HCC)    no deficits; had stroke while trying to place stents in legs.    Assessment: Patient Reported Symptoms:  Cognitive Cognitive Status: Able to follow simple commands, Confused or disoriented, Poor judgment in daily scenarios      Neurological Neurological Review of Symptoms: Not assessed    HEENT HEENT Symptoms Reported: Not assessed      Cardiovascular Cardiovascular Symptoms Reported: Not assessed    Respiratory Respiratory Symptoms Reported: Not assesed    Endocrine Patient reports the following symptoms related to hypoglycemia or hyperglycemia : Not assessed    Gastrointestinal Gastrointestinal Symptoms Reported: Not assessed      Genitourinary Genitourinary Symptoms Reported: Not assessed    Integumentary Integumentary Symptoms Reported: Not assessed    Musculoskeletal Musculoskelatal Symptoms Reviewed: Not assessed        Psychosocial Psychosocial Symptoms Reported: Not assessed (caregiver reports that  patient is more confused therefore hard to assess depression) Other Psychosocial Conditions: cognitively impaired Additional Psychological Details: caregiver reports that patient is restless at night Behavioral Health Conditions: Depression Behavioral Management Strategies: Support system Behavioral Health Self-Management Outcome: 3 (uncertain) Major Change/Loss/Stressor/Fears (CP): Medical condition, self Behaviors When Feeling Stressed/Fearful: sleeplessness Techniques to Cope with Loss/Stress/Change: Meditation Quality of Family Relationships: helpful Do you feel physically threatened by others?: No      07/23/2023    7:33 PM  Depression screen PHQ 2/9  Decreased Interest 1  Down, Depressed, Hopeless 0  PHQ - 2 Score 1    There were no vitals filed for this visit.  Medications Reviewed Today     Reviewed by Festus Hubert (Social Worker) on 07/30/23 at 1003  Med List Status: <None>   Medication Order Taking? Sig Documenting Provider Last Dose Status Informant  amiodarone  (PACERONE ) 200 MG tablet [id number]  Take 1 tablet (200 mg total) by mouth daily. Gonfa, Taye T, MD  Active Spouse/Significant Other, Pharmacy Records  atorvastatin  (LIPITOR) 80 MG tablet [id number]  Take 80 mg by mouth in the morning. [provider]  Active Spouse/Significant Other, Pharmacy Records  benzonatate  (TESSALON ) 100 MG capsule [id number]  Take 1 capsule (100 mg total) by mouth 3 (three) times daily as needed for cough. Doreene Gammon D, DO  Active            Med Note [place] Boll, [place] B   Tue Jul 14, 2023  3:03 PM) Not really giving to patient.   calcium  acetate (PHOSLO ) 667 MG capsule [id number]  Take 1,334 mg by mouth 3 (three) times daily. citracal [provider]  Active Spouse/Significant Other, Pharmacy Records  clopidogrel  (PLAVIX ) 75 MG tablet [id number]  Take 1 tablet (75 mg total) by mouth daily. Baglia, Corrina, PA-C  Active Spouse/Significant Other, Pharmacy Records   metoprolol  succinate (TOPROL  XL) 50 MG 24 hr tablet [id number]  Take 1 tablet (50 mg total) by mouth daily. Take with or immediately following a meal.  Patient not taking: Reported on 07/28/2023   Deneen Finical, PA-C  Active Spouse/Significant Other, Pharmacy Records  metoprolol  succinate (TOPROL -XL) 25 MG 24 hr tablet [id number]  Take 25 mg by mouth daily. [provider]  Active   mirtazapine  (REMERON ) 15 MG tablet [id number]  Take 15 mg by mouth at bedtime. [provider]  Active Spouse/Significant Other, Pharmacy Records  pantoprazole  (PROTONIX ) 40 MG tablet [id number]  Take 1 tablet (40 mg total) by mouth 2 (two) times daily for 60 days, THEN 1 tablet (40 mg total) daily. Gonfa, Taye T, MD  Active Spouse/Significant Other, Pharmacy Records  polyethylene glycol powder (GLYCOLAX /MIRALAX ) 17 GM/SCOOP powder [id number]  Take 17 g by mouth 2 (two) times daily as needed for mild constipation. Doroteo Gasmen, MD  Active Spouse/Significant Other, Pharmacy Records           Med Note [place], Curley Double Jul 08, 2023  6:46 PM)    tamsulosin  (FLOMAX ) 0.4 MG CAPS capsule [id number]  Take 1 capsule (0.4 mg total) by mouth daily after supper. McKenzie, Arden Beck, MD  Active Spouse/Significant Other, Pharmacy Records  torsemide  (DEMADEX ) 20 MG tablet [id number]  Take 60 mg by mouth daily.  Patient not taking: Reported on 07/28/2023   [provider]  Active   torsemide  60 MG TABS [id number]  Take 60 mg by mouth daily. Doreene Gammon D, DO  Active            Med Note Parke Boll, [place] B   Tue Jul 14, 2023  3:04 PM) [facility] had to order, waiting on a call back today regarding filling of this medication.   zolpidem  (AMBIEN ) 10 MG tablet [id number]  Take 10 mg by mouth at bedtime as needed for sleep. [provider]  Active Spouse/Significant Other, Pharmacy Records            Recommendation:   PCP Follow-up Continue Current Plan of Care with RN  Follow Up Plan:   Patient  has met all care management goals. Care Management case will be closed. Patient has been provided contact information should new needs arise.   Georgine Kitchens, MSW, LCSW [place]  [facility], [facility] Licensed Clinical Social Worker Direct Dial: [phone]

## 2023-07-30 NOTE — Patient Instructions (Signed)
 Visit Information  Thank you for taking time to visit with me today. Please don't hesitate to contact me if I can be of assistance to you before our next scheduled appointment.  Your next care management appointment is no further scheduled appointments.    Patient has met all care management goals. Care Management case will be closed. Patient has been provided contact information should new needs arise.   Please call the care guide team at [phone] if you need to cancel, schedule, or reschedule an appointment.   Please call 911 if you are experiencing a Mental Health or Behavioral Health Crisis or need someone to talk to.  Georgine Kitchens, MSW, LCSW [place]  [facility], [facility] Health Licensed Clinical Social Worker Direct Dial: [phone]

## 2023-07-31 ENCOUNTER — Other Ambulatory Visit: Payer: Self-pay

## 2023-07-31 ENCOUNTER — Other Ambulatory Visit: Payer: Self-pay | Admitting: *Deleted

## 2023-07-31 DIAGNOSIS — N186 End stage renal disease: Secondary | ICD-10-CM | POA: Diagnosis not present

## 2023-07-31 DIAGNOSIS — Z992 Dependence on renal dialysis: Secondary | ICD-10-CM | POA: Diagnosis not present

## 2023-07-31 NOTE — Patient Instructions (Addendum)
 Visit Information  Thank you for taking time to visit with me today. Please don't hesitate to contact me if I can be of assistance to you before our next scheduled appointment.  Your next care management appointment is by telephone on 08/07/23 at 3:30 pm  Remember to go pick up your new sleep medicine, Ativan  & stop taking Ambien   Please call the care guide team at [phone] if you need to cancel, schedule, or reschedule an appointment.   Please call the Suicide and Crisis Lifeline: 988 call the [place]  National Suicide Prevention Lifeline: (760)787-6568 or TTY: 212-729-3149 TTY 214-827-2754) to talk to a trained counselor call 1-800-273-TALK (toll free, 24 hour hotline) call the [facility]: 930-479-1527 call 911 if you are experiencing a Mental Health or Behavioral Health Crisis or need someone to talk to.  Ia Leeb L. Mcarthur Speedy, RN, BSN, CCM [place]  [facility], [facility] Health RN Care Manager Direct Dial: [phone]  Fax: [phone]

## 2023-07-31 NOTE — Patient Outreach (Unsigned)
 Complex Care Management   Visit Note  08/06/2023 updated noted for 07/31/23  Name:  Ronald [name] MRN: [id number] DOB: 10-04-1940  Situation: Referral received for Complex Care Management related to Heart Failure and ESRD I obtained verbal consent from Caregiver.  Visit completed with [name]  on the phone  Patient able to tolerate 3 hours os Hemodialysis (HD) today  Patient was evaluated briefly by Dr Gretta per wife for the swelling. He reviewed lymphedema & treatment vs cellulitis with [name] The wife reports he reviewed use of compression hose which has already been tried  Continues to smoke per wife She reports noting cigarettes in the kitchen after he wonders at night  Today Ativan  has been ordered to give vs Xanax by the pcp  The wife  has received a call from [facility]. She would like for it to be delivered by [employer] if possible  She agrees to allow RN CCM to outreach to see if this is possible. If not possible she will [facility] to [facility] during the weekend   Background:   Past Medical History:  Diagnosis Date   CHF (congestive heart failure) (HCC)    ESRD on hemodialysis (HCC)    GERD (gastroesophageal reflux disease)    Glaucoma    Hypertension    Macular degeneration    Myocardial infarction [facility])    1999   Prostate cancer (HCC) 02/2018   Stroke (HCC)    no deficits; had stroke while trying to place stents in legs.    Assessment: Patient Reported Symptoms:  Cognitive Cognitive Status: Able to follow simple commands, Poor judgment in daily scenarios, Confused or disoriented, Requires Assistance Decision Making      Neurological Neurological Review of Symptoms: Weakness Oher Neurological Symptoms/Conditions [RPT]: continues to miss HD days Neurological Conditions: Stroke, ischemic Neurological Management Strategies: Adequate rest, Medication therapy Neurological Self-Management Outcome: 3 (uncertain)  HEENT HEENT Symptoms Reported: No  symptoms reported HEENT Self-Management Outcome: 4 (good)    Cardiovascular Cardiovascular Symptoms Reported: Swelling in legs or feet, Fatigue Does patient have uncontrolled Hypertension?: No Cardiovascular Conditions: Coronary artery disease, Dysrhythmia, Heart failure, High blood cholesterol, Hypertension, Myocardial infarction Cardiovascular Management Strategies: Adequate rest, Medical device, Medication therapy Cardiovascular Self-Management Outcome: 3 (uncertain)  Respiratory Respiratory Symptoms Reported: Shortness of breath, Wheezing Respiratory Conditions: COPD, Shortness of breath Respiratory Self-Management Outcome: 3 (uncertain)  Endocrine Patient reports the following symptoms related to hypoglycemia or hyperglycemia : No symptoms reported Is patient diabetic?: No Endocrine Conditions: Parathyroid disorder Endocrine Management Strategies: Adequate rest Endocrine Self-Management Outcome: 4 (good)  Gastrointestinal Gastrointestinal Symptoms Reported: No symptoms reported Gastrointestinal Conditions: Abdominal pain, Bleeding, Diarrhea, Nausea, Reflux/heartburn Gastrointestinal Management Strategies: Adequate rest Gastrointestinal Self-Management Outcome: 3 (uncertain) Nutrition Risk Screen (CP): No indicators present  Genitourinary Genitourinary Symptoms Reported: Other Other Genitourinary Symptoms: continues to miss days of HD Genitourinary Conditions: Chronic kidney disease, End-stage renal disease, Prostate cancer Genitourinary Management Strategies: Adequate rest, Hemodialysis, Incontinence garment/pad, Medical device, Medication therapy Genitourinary Self-Management Outcome: 3 (uncertain)  Integumentary Integumentary Symptoms Reported: Other Other Integumentary Symptoms: swelling of legs Skin Conditions: Other Other Skin Conditions: hx of skin lesion Skin Management Strategies: Adequate rest Skin Self-Management Outcome: 3 (uncertain)  Musculoskeletal Musculoskelatal  Symptoms Reviewed: Unsteady gait, Weakness Musculoskeletal Conditions: Joint pain, Mobility limited, Unsteady gait Musculoskeletal Management Strategies: Activity, Medical device Musculoskeletal Self-Management Outcome: 3 (uncertain) Falls in the past year?: Yes Number of falls in past year: 1 or less Was there an injury with Fall?: No Fall Risk  Category Calculator: 1 Patient Fall Risk Level: Low Fall Risk Patient at Risk for Falls Due to: History of fall(s), Impaired balance/gait, Impaired mobility, Impaired vision, Mental status change Fall risk Follow up: Falls evaluation completed  Psychosocial Psychosocial Symptoms Reported: No symptoms reported, Other Other Psychosocial Conditions: cognitive impairment Behavioral Health Conditions: Depression Behavioral Management Strategies: Adequate rest, Medication therapy, Support system Behavioral Health Self-Management Outcome: 3 (uncertain)          08/05/2023    4:01 PM  Depression screen PHQ 2/9  Decreased Interest 1  Down, Depressed, Hopeless 1  PHQ - 2 Score 2  Altered sleeping 1  Tired, decreased energy 1  Change in appetite 1  Feeling bad or failure about yourself  1  Trouble concentrating 1  Moving slowly or fidgety/restless 1  Suicidal thoughts 1  PHQ-9 Score 9  Difficult doing work/chores Somewhat difficult    There were no vitals filed for this visit.  Medications Reviewed Today     Reviewed by Ramonita Suzen CROME, RN (Registered Nurse) on 07/31/23 at 1631  Med List Status: <None>   Medication Order Taking? Sig Documenting Provider Last Dose Status Informant  amiodarone  (PACERONE ) 200 MG tablet [id number]  Take 1 tablet (200 mg total) by mouth daily. Gonfa, Taye T, MD  Active Spouse/Significant Other, Pharmacy Records  atorvastatin  (LIPITOR) 80 MG tablet [id number]  Take 80 mg by mouth in the morning. [provider]  Active Spouse/Significant Other, Pharmacy Records  benzonatate  (TESSALON ) 100 MG capsule  [id number]  Take 1 capsule (100 mg total) by mouth 3 (three) times daily as needed for cough. Maree Bracken D, DO  Active            Med Note SYDELL, [place] B   Tue Jul 14, 2023  3:03 PM) Not really giving to patient.   calcium  acetate (PHOSLO ) 667 MG capsule [id number]  Take 1,334 mg by mouth 3 (three) times daily. citracal [provider]  Active Spouse/Significant Other, Pharmacy Records  clopidogrel  (PLAVIX ) 75 MG tablet [id number]  Take 1 tablet (75 mg total) by mouth daily. Charlyne Reed, PA-C  Active Spouse/Significant Other, Pharmacy Records  LORazepam  (ATIVAN ) 1 MG tablet [id number] Yes Take 1 tablet every day by oral route at bedtime, for insomnia and restlessness. [provider]  Active   metoprolol  succinate (TOPROL  XL) 50 MG 24 hr tablet [id number]  Take 1 tablet (50 mg total) by mouth daily. Take with or immediately following a meal.  Patient not taking: Reported on 07/28/2023   Charlyne Reed, PA-C  Active Spouse/Significant Other, Pharmacy Records  metoprolol  succinate (TOPROL -XL) 25 MG 24 hr tablet [id number]  Take 25 mg by mouth daily. [provider]  Active   mirtazapine  (REMERON ) 15 MG tablet [id number]  Take 15 mg by mouth at bedtime. [provider]  Active Spouse/Significant Other, Pharmacy Records  pantoprazole  (PROTONIX ) 40 MG tablet [id number]  Take 1 tablet (40 mg total) by mouth 2 (two) times daily for 60 days, THEN 1 tablet (40 mg total) daily. Gonfa, Taye T, MD  Active Spouse/Significant Other, Pharmacy Records  polyethylene glycol powder (GLYCOLAX /MIRALAX ) 17 GM/SCOOP powder [id number]  Take 17 g by mouth 2 (two) times daily as needed for mild constipation. Rosario Leatrice FERNS, MD  Active Spouse/Significant Other, Pharmacy Records           Med Note DRENA, CHUCK KANDICE [place] Jul 08, 2023  6:46 PM)    tamsulosin  (FLOMAX ) 0.4 MG CAPS capsule [id number]  Take 1 capsule (  0.4 mg total) by mouth daily after supper. McKenzie, Belvie CROME, MD  Active  Spouse/Significant Other, Pharmacy Records  torsemide  (DEMADEX ) 20 MG tablet [id number]  Take 60 mg by mouth daily.  Patient not taking: Reported on 07/28/2023   [provider]  Active   torsemide  60 MG TABS [id number]  Take 60 mg by mouth daily. Maree Bracken D, DO  Active            Med Note SYDELL, [place] B   Tue Jul 14, 2023  3:04 PM) [facility], waiting on a call back today regarding filling of this medication.   zolpidem  (AMBIEN ) 10 MG tablet [id number]  Take 10 mg by mouth at bedtime as needed for sleep.  Patient not taking: Reported on 07/31/2023   [provider]  Active Spouse/Significant Other, Pharmacy Records            Recommendation:   PCP Follow-up Continue Current Plan of Care  Follow Up Plan:   Telephone follow up appointment date/time:  08/05/23 3:30  pm  Suzen L. Ramonita, RN, BSN, CCM [place]  [facility], [facility] RN Care Manager Direct Dial: [phone]  Fax: [phone]

## 2023-08-03 DIAGNOSIS — Z992 Dependence on renal dialysis: Secondary | ICD-10-CM | POA: Diagnosis not present

## 2023-08-03 DIAGNOSIS — N186 End stage renal disease: Secondary | ICD-10-CM | POA: Diagnosis not present

## 2023-08-04 DIAGNOSIS — G894 Chronic pain syndrome: Secondary | ICD-10-CM | POA: Diagnosis not present

## 2023-08-04 DIAGNOSIS — Z8673 Personal history of transient ischemic attack (TIA), and cerebral infarction without residual deficits: Secondary | ICD-10-CM | POA: Diagnosis not present

## 2023-08-04 DIAGNOSIS — I132 Hypertensive heart and chronic kidney disease with heart failure and with stage 5 chronic kidney disease, or end stage renal disease: Secondary | ICD-10-CM | POA: Diagnosis not present

## 2023-08-04 DIAGNOSIS — K219 Gastro-esophageal reflux disease without esophagitis: Secondary | ICD-10-CM | POA: Diagnosis not present

## 2023-08-04 DIAGNOSIS — E782 Mixed hyperlipidemia: Secondary | ICD-10-CM | POA: Diagnosis not present

## 2023-08-04 DIAGNOSIS — I2510 Atherosclerotic heart disease of native coronary artery without angina pectoris: Secondary | ICD-10-CM | POA: Diagnosis not present

## 2023-08-04 DIAGNOSIS — Z48812 Encounter for surgical aftercare following surgery on the circulatory system: Secondary | ICD-10-CM | POA: Diagnosis not present

## 2023-08-04 DIAGNOSIS — I350 Nonrheumatic aortic (valve) stenosis: Secondary | ICD-10-CM | POA: Diagnosis not present

## 2023-08-04 DIAGNOSIS — K922 Gastrointestinal hemorrhage, unspecified: Secondary | ICD-10-CM | POA: Diagnosis not present

## 2023-08-04 DIAGNOSIS — G3184 Mild cognitive impairment, so stated: Secondary | ICD-10-CM | POA: Diagnosis not present

## 2023-08-04 DIAGNOSIS — S51812D Laceration without foreign body of left forearm, subsequent encounter: Secondary | ICD-10-CM | POA: Diagnosis not present

## 2023-08-04 DIAGNOSIS — Z556 Problems related to health literacy: Secondary | ICD-10-CM | POA: Diagnosis not present

## 2023-08-04 DIAGNOSIS — D631 Anemia in chronic kidney disease: Secondary | ICD-10-CM | POA: Diagnosis not present

## 2023-08-04 DIAGNOSIS — J449 Chronic obstructive pulmonary disease, unspecified: Secondary | ICD-10-CM | POA: Diagnosis not present

## 2023-08-04 DIAGNOSIS — I252 Old myocardial infarction: Secondary | ICD-10-CM | POA: Diagnosis not present

## 2023-08-04 DIAGNOSIS — Z7902 Long term (current) use of antithrombotics/antiplatelets: Secondary | ICD-10-CM | POA: Diagnosis not present

## 2023-08-04 DIAGNOSIS — N2581 Secondary hyperparathyroidism of renal origin: Secondary | ICD-10-CM | POA: Diagnosis not present

## 2023-08-04 DIAGNOSIS — Z992 Dependence on renal dialysis: Secondary | ICD-10-CM | POA: Diagnosis not present

## 2023-08-04 DIAGNOSIS — H409 Unspecified glaucoma: Secondary | ICD-10-CM | POA: Diagnosis not present

## 2023-08-04 DIAGNOSIS — Z7982 Long term (current) use of aspirin: Secondary | ICD-10-CM | POA: Diagnosis not present

## 2023-08-04 DIAGNOSIS — N186 End stage renal disease: Secondary | ICD-10-CM | POA: Diagnosis not present

## 2023-08-04 DIAGNOSIS — I5022 Chronic systolic (congestive) heart failure: Secondary | ICD-10-CM | POA: Diagnosis not present

## 2023-08-04 DIAGNOSIS — H3530 Unspecified macular degeneration: Secondary | ICD-10-CM | POA: Diagnosis not present

## 2023-08-04 DIAGNOSIS — I4811 Longstanding persistent atrial fibrillation: Secondary | ICD-10-CM | POA: Diagnosis not present

## 2023-08-04 DIAGNOSIS — E875 Hyperkalemia: Secondary | ICD-10-CM | POA: Diagnosis not present

## 2023-08-04 DIAGNOSIS — Z5982 Transportation insecurity: Secondary | ICD-10-CM | POA: Diagnosis not present

## 2023-08-05 ENCOUNTER — Other Ambulatory Visit: Payer: Self-pay | Admitting: *Deleted

## 2023-08-05 NOTE — Patient Outreach (Signed)
 Complex Care Management   Visit Note  08/06/2023  Name:  Ronald [name] MRN: [id number] DOB: 08/03/1940  Situation: Referral received for Complex Care Management related to Heart Failure and ESRD I obtained verbal consent from Caregiver Patient.  Visit completed with Ronald [name], wife with patient at her side  on the phone  Mr Word went to Hemodialysis (HD), on Monday 08/03/23 He got up & pull out his tubing during the process Today was not able to go to HD per wife  Ativan  does not help. No longer taking Xanax. Has been taking it since Sunday 08/02/23. He is reported to still stay up throughout the night, go to sleep at 8 am and naps throughout the day even with interventions from wife or home health staff to keep him awake. Ativan  worked well on 08/02/23 only.   HHRN visited on 08/04/23 & patient had complaints of pain all over. He was wanting to die give up per wife. Only Tylenol  is being used when patient agrees to take it.  Still has wheezing, not being able to lie down as he voices he can not breath. Has an incentive spirometer he is not interested in using even with wife encouragement. Wife confirms she is aware of the purpose of incentive spirometer (exercise lungs, prevent pneumonia+)  Ronald [name] reports he is not interested in doing anything even with encouragement but does not confirm possible depression. Has access to [facility] SW, Ronald [name]  His wife states he prefers she take him to HD and stay with him + to Dr Ronald [name] office. She will consider use of transportation services as she can accompany him.   Background:   Past Medical History:  Diagnosis Date   CHF (congestive heart failure) (HCC)    ESRD on hemodialysis (HCC)    GERD (gastroesophageal reflux disease)    Glaucoma    Hypertension    Macular degeneration    Myocardial infarction [facility])    1999   Prostate cancer (HCC) 02/2018   Stroke (HCC)    no deficits; had stroke while trying to place stents  in legs.    Assessment: Patient Reported Symptoms:  Cognitive Cognitive Status: Able to follow simple commands, Confused or disoriented, Poor judgment in daily scenarios, Requires Assistance Decision Making      Neurological Neurological Review of Symptoms: Weakness Neurological Conditions: Stroke, ischemic Neurological Management Strategies: Adequate rest, Medication therapy Neurological Self-Management Outcome: 3 (uncertain)  HEENT HEENT Symptoms Reported: No symptoms reported HEENT Conditions: Ear problem(s), Vision problem(s) Vision Problems: glaucoma HEENT Management Strategies: Adequate rest, Medical device HEENT Self-Management Outcome: 4 (good) Ear problem(s), Vision problem(s)  Cardiovascular Cardiovascular Symptoms Reported: Swelling in legs or feet Does patient have uncontrolled Hypertension?: No Cardiovascular Conditions: Coronary artery disease, Dysrhythmia, Heart failure, High blood cholesterol, Hypertension, Myocardial infarction Cardiovascular Management Strategies: Adequate rest, Medical device, Medication therapy Cardiovascular Self-Management Outcome: 3 (uncertain)  Respiratory Respiratory Symptoms Reported: Shortness of breath, Wheezing Other Respiratory Symptoms: reported shortness of breath & wheezing after lying down flat to sleep & missed HD sessions Respiratory Conditions: COPD, Shortness of breath Respiratory Self-Management Outcome: 3 (uncertain) Respiratory Comment: wife voices understanding of importance of elevation of head & feet + how CHF/edema is related  Endocrine Patient reports the following symptoms related to hypoglycemia or hyperglycemia : Irritability, Shortness of breath, Weakness or fatigue Is patient diabetic?: No Endocrine Conditions: Parathyroid disorder Endocrine Management Strategies: Adequate rest Endocrine Self-Management Outcome: 3 (uncertain)  Gastrointestinal Gastrointestinal Symptoms Reported: No symptoms  reported Gastrointestinal Conditions: Abdominal  pain, Bleeding, Diarrhea, Nausea, Reflux/heartburn Gastrointestinal Management Strategies: Adequate rest Gastrointestinal Self-Management Outcome: 3 (uncertain) Nutrition Risk Screen (CP): No indicators present  Genitourinary Genitourinary Symptoms Reported: No symptoms reported Genitourinary Conditions: Chronic kidney disease, End-stage renal disease, Prostate cancer Genitourinary Management Strategies: Adequate rest, Hemodialysis, Incontinence garment/pad, Medication therapy, Medical device Hemodialysis Last Treatment: 08/03/23 Genitourinary Self-Management Outcome: 3 (uncertain)  Integumentary Integumentary Symptoms Reported: Other Other Integumentary Symptoms: continued swelling of legs even after use of diuretic and HD sessions ? Lymphedema/Cellulitis Skin Conditions: Other Other Skin Conditions: hx of skin lesion, continued swelling of legs even after HD sessions & diuretics Skin Management Strategies: Adequate rest Skin Self-Management Outcome: 3 (uncertain)  Musculoskeletal Musculoskelatal Symptoms Reviewed: Unsteady gait, Weakness Additional Musculoskeletal Details: his ambulation changes from day to day per wife. Reported to have days she has to help him stand/walk, days she can not get him to stand and days he spontaneously gets up by himself and walk throughout the home + Musculoskeletal Conditions: Joint pain, Mobility limited, Unsteady gait Musculoskeletal Management Strategies: Adequate rest, Medical device Musculoskeletal Self-Management Outcome: 3 (uncertain) Falls in the past year?: Yes Number of falls in past year: 1 or less Was there an injury with Fall?: No Fall Risk Category Calculator: 1 Patient Fall Risk Level: Low Fall Risk Patient at Risk for Falls Due to: History of fall(s), Impaired balance/gait, Impaired mobility, Impaired vision, Mental status change Fall risk Follow up: Falls evaluation completed, Falls  prevention discussed  Psychosocial Psychosocial Symptoms Reported: Depression - if selected complete PHQ 2-9 Other Psychosocial Conditions: cognitive impairment Additional Psychological Details: wife states she is not sure if he is depressed but changes has been noticed in his interest to do anything Behavioral Health Conditions: Depression Behavioral Management Strategies: Adequate rest, Medication therapy, Support system Behavioral Health Self-Management Outcome: 3 (uncertain) Major Change/Loss/Stressor/Fears (CP): Medical condition, self Behaviors When Feeling Stressed/Fearful: loss of interest, sleeping, refusal to participate in activities Techniques to Cope with Loss/Stress/Change: Diversional activities, Medication Quality of Family Relationships: helpful, involved, supportive      08/05/2023    4:01 PM  Depression screen PHQ 2/9  Decreased Interest 1  Down, Depressed, Hopeless 1  PHQ - 2 Score 2  Altered sleeping 1  Tired, decreased energy 1  Change in appetite 1  Feeling bad or failure about yourself  1  Trouble concentrating 1  Moving slowly or fidgety/restless 1  Suicidal thoughts 1  PHQ-9 Score 9  Difficult doing work/chores Somewhat difficult    There were no vitals filed for this visit.  Medications Reviewed Today   Medications were not reviewed in this encounter     Recommendation:   Encouraged use of transportation services especially for the 7/3/25PCP Follow-up Continue Current Plan of Care Encouraged hemodialysis treatments as much as possible. Consider administering Ativan  earlier in evening   Follow Up Plan:   Telephone follow up appointment date/time:  08/12/23 3 pm  Xandria Gallaga Ronald. Ramonita, RN, BSN, CCM [place]  [facility], [facility] RN Care Manager Direct Dial: [phone]  Fax: [phone]

## 2023-08-06 ENCOUNTER — Encounter: Payer: Self-pay | Admitting: *Deleted

## 2023-08-06 ENCOUNTER — Other Ambulatory Visit: Payer: Self-pay

## 2023-08-06 DIAGNOSIS — K922 Gastrointestinal hemorrhage, unspecified: Secondary | ICD-10-CM | POA: Diagnosis not present

## 2023-08-06 DIAGNOSIS — Z556 Problems related to health literacy: Secondary | ICD-10-CM | POA: Diagnosis not present

## 2023-08-06 DIAGNOSIS — H409 Unspecified glaucoma: Secondary | ICD-10-CM | POA: Diagnosis not present

## 2023-08-06 DIAGNOSIS — Z992 Dependence on renal dialysis: Secondary | ICD-10-CM | POA: Diagnosis not present

## 2023-08-06 DIAGNOSIS — J449 Chronic obstructive pulmonary disease, unspecified: Secondary | ICD-10-CM | POA: Diagnosis not present

## 2023-08-06 DIAGNOSIS — I252 Old myocardial infarction: Secondary | ICD-10-CM | POA: Diagnosis not present

## 2023-08-06 DIAGNOSIS — G3184 Mild cognitive impairment, so stated: Secondary | ICD-10-CM | POA: Diagnosis not present

## 2023-08-06 DIAGNOSIS — Z7902 Long term (current) use of antithrombotics/antiplatelets: Secondary | ICD-10-CM | POA: Diagnosis not present

## 2023-08-06 DIAGNOSIS — Z5982 Transportation insecurity: Secondary | ICD-10-CM | POA: Diagnosis not present

## 2023-08-06 DIAGNOSIS — I4811 Longstanding persistent atrial fibrillation: Secondary | ICD-10-CM | POA: Diagnosis not present

## 2023-08-06 DIAGNOSIS — I5022 Chronic systolic (congestive) heart failure: Secondary | ICD-10-CM | POA: Diagnosis not present

## 2023-08-06 DIAGNOSIS — I132 Hypertensive heart and chronic kidney disease with heart failure and with stage 5 chronic kidney disease, or end stage renal disease: Secondary | ICD-10-CM | POA: Diagnosis not present

## 2023-08-06 DIAGNOSIS — Z8673 Personal history of transient ischemic attack (TIA), and cerebral infarction without residual deficits: Secondary | ICD-10-CM | POA: Diagnosis not present

## 2023-08-06 DIAGNOSIS — D631 Anemia in chronic kidney disease: Secondary | ICD-10-CM | POA: Diagnosis not present

## 2023-08-06 DIAGNOSIS — N2581 Secondary hyperparathyroidism of renal origin: Secondary | ICD-10-CM | POA: Diagnosis not present

## 2023-08-06 DIAGNOSIS — Z48812 Encounter for surgical aftercare following surgery on the circulatory system: Secondary | ICD-10-CM | POA: Diagnosis not present

## 2023-08-06 DIAGNOSIS — E875 Hyperkalemia: Secondary | ICD-10-CM | POA: Diagnosis not present

## 2023-08-06 DIAGNOSIS — E782 Mixed hyperlipidemia: Secondary | ICD-10-CM | POA: Diagnosis not present

## 2023-08-06 DIAGNOSIS — Z7982 Long term (current) use of aspirin: Secondary | ICD-10-CM | POA: Diagnosis not present

## 2023-08-06 DIAGNOSIS — K219 Gastro-esophageal reflux disease without esophagitis: Secondary | ICD-10-CM | POA: Diagnosis not present

## 2023-08-06 DIAGNOSIS — S51812D Laceration without foreign body of left forearm, subsequent encounter: Secondary | ICD-10-CM | POA: Diagnosis not present

## 2023-08-06 DIAGNOSIS — I2510 Atherosclerotic heart disease of native coronary artery without angina pectoris: Secondary | ICD-10-CM | POA: Diagnosis not present

## 2023-08-06 DIAGNOSIS — H3530 Unspecified macular degeneration: Secondary | ICD-10-CM | POA: Diagnosis not present

## 2023-08-06 DIAGNOSIS — G894 Chronic pain syndrome: Secondary | ICD-10-CM | POA: Diagnosis not present

## 2023-08-06 DIAGNOSIS — N186 End stage renal disease: Secondary | ICD-10-CM | POA: Diagnosis not present

## 2023-08-06 DIAGNOSIS — I350 Nonrheumatic aortic (valve) stenosis: Secondary | ICD-10-CM | POA: Diagnosis not present

## 2023-08-06 NOTE — Patient Instructions (Signed)
 Visit Information  Thank you for taking time to visit with me today. Please don't hesitate to contact me if I can be of assistance to you before our next scheduled appointment.  Your next care management appointment is by telephone on 08/12/23 at 3 pm  Please consider using your transportation benefits to assist him with getting to Dialysis and other appointments. You are able to accompany him at all times  Please call the care guide team at [phone] if you need to cancel, schedule, or reschedule an appointment.   Please call the Suicide and Crisis Lifeline: 988 call the [place]  National Suicide Prevention Lifeline: 347-858-7850 or TTY: 720-181-4678 TTY (973)767-3087) to talk to a trained counselor call 1-800-273-TALK (toll free, 24 hour hotline) call the [facility]: 760-877-7107 call 911 if you are experiencing a Mental Health or Behavioral Health Crisis or need someone to talk to.  Madalena Kesecker L. Ramonita, RN, BSN, CCM [place]  [facility], [facility] RN Care Manager Direct Dial: [phone]  Fax: [phone]

## 2023-08-07 ENCOUNTER — Encounter: Payer: Self-pay | Admitting: *Deleted

## 2023-08-07 DIAGNOSIS — Z992 Dependence on renal dialysis: Secondary | ICD-10-CM | POA: Diagnosis not present

## 2023-08-07 DIAGNOSIS — G459 Transient cerebral ischemic attack, unspecified: Secondary | ICD-10-CM | POA: Insufficient documentation

## 2023-08-07 DIAGNOSIS — N186 End stage renal disease: Secondary | ICD-10-CM | POA: Diagnosis not present

## 2023-08-10 DIAGNOSIS — Z992 Dependence on renal dialysis: Secondary | ICD-10-CM | POA: Diagnosis not present

## 2023-08-10 DIAGNOSIS — N186 End stage renal disease: Secondary | ICD-10-CM | POA: Diagnosis not present

## 2023-08-11 DIAGNOSIS — J449 Chronic obstructive pulmonary disease, unspecified: Secondary | ICD-10-CM | POA: Diagnosis not present

## 2023-08-11 DIAGNOSIS — D631 Anemia in chronic kidney disease: Secondary | ICD-10-CM | POA: Diagnosis not present

## 2023-08-11 DIAGNOSIS — I4811 Longstanding persistent atrial fibrillation: Secondary | ICD-10-CM | POA: Diagnosis not present

## 2023-08-11 DIAGNOSIS — E782 Mixed hyperlipidemia: Secondary | ICD-10-CM | POA: Diagnosis not present

## 2023-08-11 DIAGNOSIS — Z5982 Transportation insecurity: Secondary | ICD-10-CM | POA: Diagnosis not present

## 2023-08-11 DIAGNOSIS — G894 Chronic pain syndrome: Secondary | ICD-10-CM | POA: Diagnosis not present

## 2023-08-11 DIAGNOSIS — I5022 Chronic systolic (congestive) heart failure: Secondary | ICD-10-CM | POA: Diagnosis not present

## 2023-08-11 DIAGNOSIS — H3530 Unspecified macular degeneration: Secondary | ICD-10-CM | POA: Diagnosis not present

## 2023-08-11 DIAGNOSIS — Z556 Problems related to health literacy: Secondary | ICD-10-CM | POA: Diagnosis not present

## 2023-08-11 DIAGNOSIS — I350 Nonrheumatic aortic (valve) stenosis: Secondary | ICD-10-CM | POA: Diagnosis not present

## 2023-08-11 DIAGNOSIS — G3184 Mild cognitive impairment, so stated: Secondary | ICD-10-CM | POA: Diagnosis not present

## 2023-08-11 DIAGNOSIS — Z7982 Long term (current) use of aspirin: Secondary | ICD-10-CM | POA: Diagnosis not present

## 2023-08-11 DIAGNOSIS — K219 Gastro-esophageal reflux disease without esophagitis: Secondary | ICD-10-CM | POA: Diagnosis not present

## 2023-08-11 DIAGNOSIS — I252 Old myocardial infarction: Secondary | ICD-10-CM | POA: Diagnosis not present

## 2023-08-11 DIAGNOSIS — I132 Hypertensive heart and chronic kidney disease with heart failure and with stage 5 chronic kidney disease, or end stage renal disease: Secondary | ICD-10-CM | POA: Diagnosis not present

## 2023-08-11 DIAGNOSIS — H409 Unspecified glaucoma: Secondary | ICD-10-CM | POA: Diagnosis not present

## 2023-08-11 DIAGNOSIS — I2510 Atherosclerotic heart disease of native coronary artery without angina pectoris: Secondary | ICD-10-CM | POA: Diagnosis not present

## 2023-08-11 DIAGNOSIS — Z992 Dependence on renal dialysis: Secondary | ICD-10-CM | POA: Diagnosis not present

## 2023-08-11 DIAGNOSIS — K922 Gastrointestinal hemorrhage, unspecified: Secondary | ICD-10-CM | POA: Diagnosis not present

## 2023-08-11 DIAGNOSIS — N186 End stage renal disease: Secondary | ICD-10-CM | POA: Diagnosis not present

## 2023-08-11 DIAGNOSIS — Z48812 Encounter for surgical aftercare following surgery on the circulatory system: Secondary | ICD-10-CM | POA: Diagnosis not present

## 2023-08-11 DIAGNOSIS — Z7902 Long term (current) use of antithrombotics/antiplatelets: Secondary | ICD-10-CM | POA: Diagnosis not present

## 2023-08-11 DIAGNOSIS — Z8673 Personal history of transient ischemic attack (TIA), and cerebral infarction without residual deficits: Secondary | ICD-10-CM | POA: Diagnosis not present

## 2023-08-11 DIAGNOSIS — N2581 Secondary hyperparathyroidism of renal origin: Secondary | ICD-10-CM | POA: Diagnosis not present

## 2023-08-11 DIAGNOSIS — E875 Hyperkalemia: Secondary | ICD-10-CM | POA: Diagnosis not present

## 2023-08-11 DIAGNOSIS — S51812D Laceration without foreign body of left forearm, subsequent encounter: Secondary | ICD-10-CM | POA: Diagnosis not present

## 2023-08-12 ENCOUNTER — Other Ambulatory Visit (INDEPENDENT_AMBULATORY_CARE_PROVIDER_SITE_OTHER): Payer: Self-pay | Admitting: Ophthalmology

## 2023-08-12 ENCOUNTER — Other Ambulatory Visit: Payer: Self-pay | Admitting: *Deleted

## 2023-08-12 ENCOUNTER — Other Ambulatory Visit: Payer: Self-pay

## 2023-08-12 NOTE — Patient Outreach (Signed)
 Complex Care Management   Visit Note  08/12/2023  Name:  Ronald [name] MRN: [id number] DOB: 1940/06/27  Situation: Referral received for Complex Care Management related to Heart Failure and ESRD I obtained verbal consent from Caregiver Patient.  Visit completed with Ronald [name]  on the phone   Today Ronald [name] said he was not feeling well per Ronald [name] Did not go to HD today because she felt he would not stay still if he went  Patient attended Hemodialysis (HD) for 2.5 hours only on Monday 08/10/23 and Friday 08/07/23. She reports staying with him during the sessions but he was very restless   Presently in bed at the time of the outreach Wife reports he is struggling with breathing issues like wheezing and congestion, making it hard for him to lie down in the bed especially at night & some during the day, He can not lay down even with an extra pillow. Every hour or so he is noted sitting up on the side of the bed.  Then from 1-3 am he is up in the kitchen eating, smoking cigars or drinking coffee even if the ativan  is giving    Home health RN visit 10-11 am on 08/11/23 After she left he had bad headache Tylenol  given without much relief  His wife, Ronald [name], expressed deep concern for his comfort and acknowledged that his condition is not improving. She feels overwhelmed by decisions about his care, especially regarding the inconsistency of his good and bad days. She confirms she realizes this will not get better  & It is hard to keep him comfortable It hurts my heart so bad to see him like this - she does not want him to suffer She does not know how to make a decision about the plan of care related to How to do when he has good days then bad days    Ronald [name] is worried about their financial situation if he were to pass away She inquires what palliative care could do that she is not already doing She voiced understanding when RN CCM  discussed what palliative care would offer and encouraged collaboration with Rosina Perry, palliative care SW  She confirms feeling selfish. I don't know how to live without him mentally and financially. She confirms she is living off their social security benefits She confirms her children has expressed wanting comfort care for the patient but has not think about me after he is gone She shares that her son is a marine and has encouraged her to sell their home  They live in a mobile home on someone else's property - It will take about 2 yrs to pay off the house + she just brought a new car to take him to HD sessions, after the old one stopped working   She confirms they are scheduled to visit the pcp, Dr Shona on 08/13/23   She voiced appreciation of any assistance that can be offered to her and patient to prevent financial stress if he were to pass. She agreed for RN CCM to collaborate with the pcp, VBCI SW, Palliative care staff + as needed. She concluded the call as the patient came out of the bedroom to find her  Background:   Past Medical History:  Diagnosis Date   CHF (congestive heart failure) (HCC)    ESRD on hemodialysis (HCC)    GERD (gastroesophageal reflux disease)    Glaucoma    Hypertension    Macular degeneration  Myocardial infarction [facility])    1999   Prostate cancer (HCC) 02/2018   Stroke (HCC)    no deficits; had stroke while trying to place stents in legs.    Assessment: Patient Reported Symptoms:  Cognitive Cognitive Status: Able to follow simple commands, Confused or disoriented, Difficulties with attention and concentration, Poor judgment in daily scenarios, Struggling with memory recall, Requires Assistance Decision Making Other: wife reports he is having good days and them bad days but still not able to tolerate a total of 3+ hours of dialysis Had a good day on 08/11/23 but not on 08/12/23 Wife reprots he sleeps for 1 hour or less but awakes searching for  her, does not want her out of his view   Health Maintenance Behaviors: Sleep adequate (hemodialysis as much as possible) Health Facilitated by: Rest (hemodialysis)  Neurological Neurological Review of Symptoms: Headaches, Weakness Oher Neurological Symptoms/Conditions [RPT]: reports weakness & headache after [facility] RN left on 08/11/23 Neurological Management Strategies: Adequate rest, Medication therapy Neurological Self-Management Outcome: 3 (uncertain)  HEENT HEENT Symptoms Reported: No symptoms reported HEENT Self-Management Outcome: 4 (good) Ear problem(s), Vision problem(s)  Cardiovascular Cardiovascular Symptoms Reported: Fatigue, Swelling in legs or feet Does patient have uncontrolled Hypertension?: No Cardiovascular Management Strategies: Adequate rest, Medication therapy Cardiovascular Self-Management Outcome: 3 (uncertain)  Respiratory Respiratory Symptoms Reported: Shortness of breath, Wheezing Other Respiratory Symptoms: reported shortness of breath & wheezing after lying down flat to sleep & missed HD sessions Additional Respiratory Details: sleeps better in his recliner Respiratory Management Strategies: Adequate rest Respiratory Self-Management Outcome: 3 (uncertain)  Endocrine Endocrine Symptoms Reported: Headaches, Irritability, Shortness of breath, Weakness or fatigue Is patient diabetic?: No Endocrine Self-Management Outcome: 3 (uncertain)  Gastrointestinal Gastrointestinal Symptoms Reported: No symptoms reported Gastrointestinal Management Strategies: Adequate rest Gastrointestinal Self-Management Outcome: 3 (uncertain) Nutrition Risk Screen (CP): No indicators present  Genitourinary Genitourinary Symptoms Reported: No symptoms reported Genitourinary Management Strategies: Adequate rest, Hemodialysis, Incontinence garment/pad, Medication therapy, Medical device Hemodialysis Last Treatment: 08/10/23 Genitourinary Self-Management Outcome: 3 (uncertain)  Integumentary  Integumentary Symptoms Reported: Other Other Integumentary Symptoms: continued swelling of legs even after use of diuretic and HD sessions ? Lymphedema/Cellulitis Additional Integumentary Details: No redness, drainage but has swelling & pain Skin Management Strategies: Adequate rest Skin Self-Management Outcome: 3 (uncertain)  Musculoskeletal Musculoskelatal Symptoms Reviewed: Unsteady gait, Weakness Additional Musculoskeletal Details: his ambulation changes from day to day per wife. Reported to have days she has to help him stand/walk, days she can not get him to stand and days he spontaneously gets up by himself and walk throughout the home + Musculoskeletal Management Strategies: Adequate rest, Medical device Musculoskeletal Self-Management Outcome: 3 (uncertain) Falls in the past year?: Yes Number of falls in past year: 1 or less Was there an injury with Fall?: No Fall Risk Category Calculator: 1 Patient Fall Risk Level: Low Fall Risk Patient at Risk for Falls Due to: History of fall(s), Impaired balance/gait, Impaired mobility, Impaired vision, Mental status change Fall risk Follow up: Falls evaluation completed, Falls prevention discussed  Psychosocial Psychosocial Symptoms Reported: Irritability, Alteration in sleep habits Other Psychosocial Conditions: cognitive impairment  Taken Behavioral Management Strategies: Adequate rest, Coping strategies, Medication therapy, Support system Behavioral Health Self-Management Outcome: 3 (uncertain) Behavioral Health Comment: wife reports patient is up late nights eating, drinking coffee, smoking cigars Major Change/Loss/Stressor/Fears (CP): Medical condition, self Techniques to Cope with Loss/Stress/Change: Diversional activities, Medication, Substance use Quality of Family Relationships: helpful, supportive Do you feel physically threatened by others?: No  08/12/2023    4:58 PM  Depression screen PHQ 2/9  Decreased Interest 1  Down,  Depressed, Hopeless 1  PHQ - 2 Score 2  Altered sleeping 1  Tired, decreased energy 1  Change in appetite 1  Feeling bad or failure about yourself  1  Trouble concentrating 1  Moving slowly or fidgety/restless 1  Suicidal thoughts 1  PHQ-9 Score 9  Difficult doing work/chores Somewhat difficult    There were no vitals filed for this visit.  Medications Reviewed Today     Reviewed by Ramonita Suzen CROME, RN (Registered Nurse) on 08/12/23 at 1725  Med List Status: <None>   Medication Order Taking? Sig Documenting Provider Last Dose Status Informant  amiodarone  (PACERONE ) 200 MG tablet [id number] Yes Take 1 tablet (200 mg total) by mouth daily. Gonfa, Taye T, MD  Active Spouse/Significant Other, Pharmacy Records  atorvastatin  (LIPITOR) 80 MG tablet [id number] Yes Take 80 mg by mouth in the morning. [provider]  Active Spouse/Significant Other, Pharmacy Records  benzonatate  (TESSALON ) 100 MG capsule [id number] Yes Take 1 capsule (100 mg total) by mouth 3 (three) times daily as needed for cough. Maree Bracken D, DO  Active            Med Note SYDELL, Orrtanna B   Tue Jul 14, 2023  3:03 PM) Not really giving to patient.   calcium  acetate (PHOSLO ) 667 MG capsule [id number]  Take 1,334 mg by mouth 3 (three) times daily. citracal [provider]  Active Spouse/Significant Other, Pharmacy Records  Calcium  Carbonate Antacid (TUMS PO) [id number] Yes 500 mg. [provider]  Active   carvedilol  (COREG ) 6.25 MG tablet [id number] Yes Take 6.25 mg by mouth. [provider]  Active   ciprofloxacin  (CIPRO ) 500 MG tablet [id number]  TAKE 1 TABLET BY MOUTH EVERY DAY FOR 5 DAYS  Patient not taking: Reported on 08/12/2023   [provider]  Active   clopidogrel  (PLAVIX ) 75 MG tablet [id number]  Take 1 tablet (75 mg total) by mouth daily.  Patient not taking: Reported on 08/12/2023   Baglia, Corrina, PA-C  Active Spouse/Significant Other, Pharmacy Records  clopidogrel   (PLAVIX ) 75 MG tablet [id number] Yes Take 75 mg by mouth daily. [provider]  Active   LORazepam  (ATIVAN ) 1 MG tablet [id number]  Take 1 tablet every day by oral route at bedtime, for insomnia and restlessness. [provider]  Active   Magnesium  Citrate 100 MG CAPS [id number] Yes See Instructions, 0 Refill(s) [provider]  Active   metoprolol  succinate (TOPROL  XL) 50 MG 24 hr tablet [id number]  Take 1 tablet (50 mg total) by mouth daily. Take with or immediately following a meal.  Patient not taking: Reported on 07/28/2023   Charlyne Reed, PA-C  Active Spouse/Significant Other, Pharmacy Records  metoprolol  succinate (TOPROL -XL) 25 MG 24 hr tablet [id number]  Take 25 mg by mouth daily. [provider]  Active   metoprolol  succinate (TOPROL -XL) 25 MG 24 hr tablet [id number]  Take 25 mg by mouth daily.  Patient not taking: Reported on 08/12/2023   [provider]  Active   metoprolol  succinate (TOPROL -XL) 50 MG 24 hr tablet [id number]  Take 50 mg by mouth daily. TAKE WITH OR IMMEDIATELY FOLLOWING A MEAL. [provider]  Active   mirtazapine  (REMERON ) 15 MG tablet [id number] Yes Take 15 mg by mouth at bedtime. [provider]  Active Spouse/Significant Other, Pharmacy Records  pantoprazole  (PROTONIX ) 40 MG tablet [id number] Yes Take 1 tablet (40  mg total) by mouth 2 (two) times daily for 60 days, THEN 1 tablet (40 mg total) daily. Gonfa, Taye T, MD  Active Spouse/Significant Other, Pharmacy Records  polyethylene glycol powder (GLYCOLAX /MIRALAX ) 17 GM/SCOOP powder [id number] Yes Take 17 g by mouth 2 (two) times daily as needed for mild constipation. Rosario Leatrice FERNS, MD  Active Spouse/Significant Other, Pharmacy Records           Med Note [place], CHUCK KANDICE [place] Jul 08, 2023  6:46 PM)    tamsulosin  (FLOMAX ) 0.4 MG CAPS capsule [id number] Yes Take 1 capsule (0.4 mg total) by mouth daily after supper. McKenzie, Belvie CROME, MD  Active  Spouse/Significant Other, Pharmacy Records  torsemide  (DEMADEX ) 20 MG tablet [id number]  Take 60 mg by mouth daily.  Patient not taking: Reported on 07/28/2023   [provider]  Active   torsemide  (DEMADEX ) 20 MG tablet [id number] Yes Take 60 mg by mouth daily. [provider]  Active   torsemide  60 MG TABS [id number]  Take 60 mg by mouth daily.  Patient not taking: Reported on 08/12/2023   Maree Bracken D, DO  Expired 08/11/23 2359            Med Note (BELL, BARBARA B   Tue Jul 14, 2023  3:04 PM) [facility] had to order, waiting on a call back today regarding filling of this medication.   traMADol  (ULTRAM ) 50 MG tablet [id number] Yes Take 50 mg by mouth. [provider]  Active   zolpidem  (AMBIEN ) 10 MG tablet [id number]  Take 10 mg by mouth at bedtime as needed for sleep.  Patient not taking: Reported on 07/31/2023   [provider]  Active Spouse/Significant Other, Pharmacy Records  zolpidem  (AMBIEN ) 10 MG tablet [id number] Yes Take 10 mg by mouth. [provider]  Active             Recommendation:   PCP Follow-up  Follow Up Plan:   Telephone follow up appointment date/time:  08/19/23 3:30 pm  Ilhan Madan L. Ramonita, RN, BSN, CCM [place]  [facility], [facility] RN Care Manager Direct Dial: [phone]  Fax: [phone]

## 2023-08-12 NOTE — Patient Instructions (Signed)
 Visit Information  Thank you for taking time to visit with me today. Please don't hesitate to contact me if I can be of assistance to you before our next scheduled appointment.  Your next care management appointment is by telephone on 08/19/23 at 3:30 pm  Please discuss the concerns voiced today also with the pcp on 08/13/23  Please call the care guide team at [phone] if you need to cancel, schedule, or reschedule an appointment.   Please call the Suicide and Crisis Lifeline: 988 call the [place]  National Suicide Prevention Lifeline: 6043873169 or TTY: 581-749-8700 TTY 9412297085) to talk to a trained counselor call 1-800-273-TALK (toll free, 24 hour hotline) call the [facility]: ([phone] call 911 if you are experiencing a Mental Health or Behavioral Health Crisis or need someone to talk to.   Danette Weinfeld L. Ramonita, RN, BSN, CCM [place]  [facility], [facility] RN Care Manager Direct Dial: [phone]  Fax: [phone]

## 2023-08-13 ENCOUNTER — Telehealth: Payer: Self-pay | Admitting: *Deleted

## 2023-08-13 DIAGNOSIS — I700 Atherosclerosis of aorta: Secondary | ICD-10-CM | POA: Insufficient documentation

## 2023-08-13 DIAGNOSIS — I132 Hypertensive heart and chronic kidney disease with heart failure and with stage 5 chronic kidney disease, or end stage renal disease: Secondary | ICD-10-CM | POA: Diagnosis not present

## 2023-08-13 DIAGNOSIS — N289 Disorder of kidney and ureter, unspecified: Secondary | ICD-10-CM | POA: Insufficient documentation

## 2023-08-13 DIAGNOSIS — R600 Localized edema: Secondary | ICD-10-CM | POA: Diagnosis not present

## 2023-08-13 DIAGNOSIS — F0390 Unspecified dementia without behavioral disturbance: Secondary | ICD-10-CM | POA: Insufficient documentation

## 2023-08-13 DIAGNOSIS — Z8719 Personal history of other diseases of the digestive system: Secondary | ICD-10-CM | POA: Diagnosis not present

## 2023-08-13 DIAGNOSIS — J449 Chronic obstructive pulmonary disease, unspecified: Secondary | ICD-10-CM | POA: Diagnosis not present

## 2023-08-13 DIAGNOSIS — N186 End stage renal disease: Secondary | ICD-10-CM | POA: Diagnosis not present

## 2023-08-13 DIAGNOSIS — K219 Gastro-esophageal reflux disease without esophagitis: Secondary | ICD-10-CM | POA: Diagnosis not present

## 2023-08-13 DIAGNOSIS — F17210 Nicotine dependence, cigarettes, uncomplicated: Secondary | ICD-10-CM | POA: Diagnosis not present

## 2023-08-13 DIAGNOSIS — I509 Heart failure, unspecified: Secondary | ICD-10-CM | POA: Diagnosis not present

## 2023-08-13 DIAGNOSIS — I10 Essential (primary) hypertension: Secondary | ICD-10-CM | POA: Diagnosis not present

## 2023-08-13 NOTE — Patient Outreach (Signed)
 Patient had a scheduled appointment with Dr. Shona today and LCSW was in office today. LCSW had a brief encounter with patient and spouse and asked LCSW could assist with anything while they were in the office. Patient's spouse stated that she could not think of anything. LCSW reminded them that RN is also available and still involved in case. They appeared appreciative of LCSW efforts.  Olam Ally, MSW, LCSW [place]  [facility], [facility] Licensed Clinical Social Worker Direct Dial: [phone]

## 2023-08-13 NOTE — Patient Outreach (Signed)
 Collaboration with patient care team  Reviewed case information with L Bracken, VBCI SW. Confirmed VBCI SW closure of last referral for depression Updated pcp/staff/palliative care SW via email Encouraged palliative care outreach & inclusion of palliative SW services for financial support +  Keosha Rossa L. Ramonita, RN, BSN, CCM [place]  [facility], [facility] RN Care Manager Direct Dial: [phone]  Fax: [phone]'

## 2023-08-14 DIAGNOSIS — N186 End stage renal disease: Secondary | ICD-10-CM | POA: Diagnosis not present

## 2023-08-14 DIAGNOSIS — Z992 Dependence on renal dialysis: Secondary | ICD-10-CM | POA: Diagnosis not present

## 2023-08-15 IMAGING — US US CAROTID DUPLEX BILAT
1 series · 13 of 24 positions shown · non-contrast
Comparison: None Available.

CLINICAL DATA: 80-year-old male with left carotid bruit

EXAM:
BILATERAL CAROTID DUPLEX ULTRASOUND
TECHNIQUE: Gray scale imaging, color Doppler and duplex ultrasound were
performed of bilateral carotid and vertebral arteries in the neck.

[Series 1: us carotid bilateral · 13 of 68 slices shown]
[im 1/68]
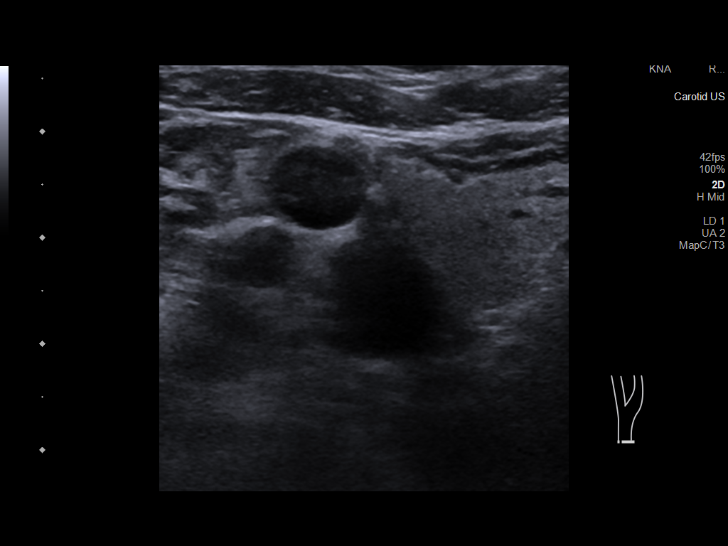
[im 6/68]
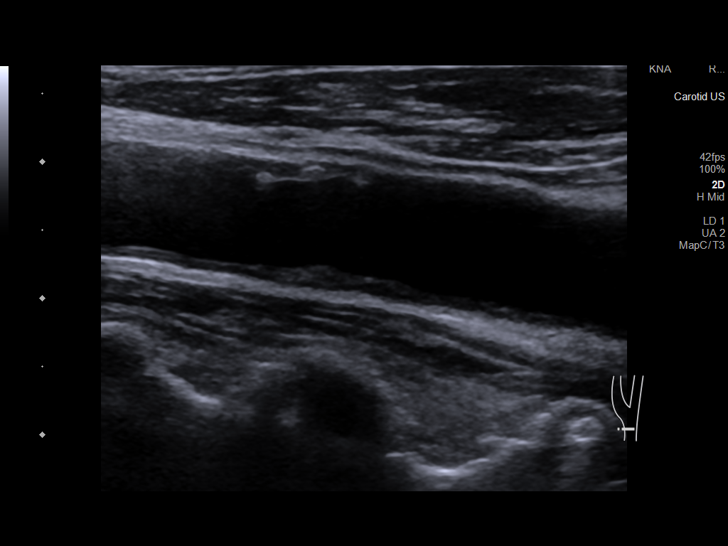
[im 12/68]
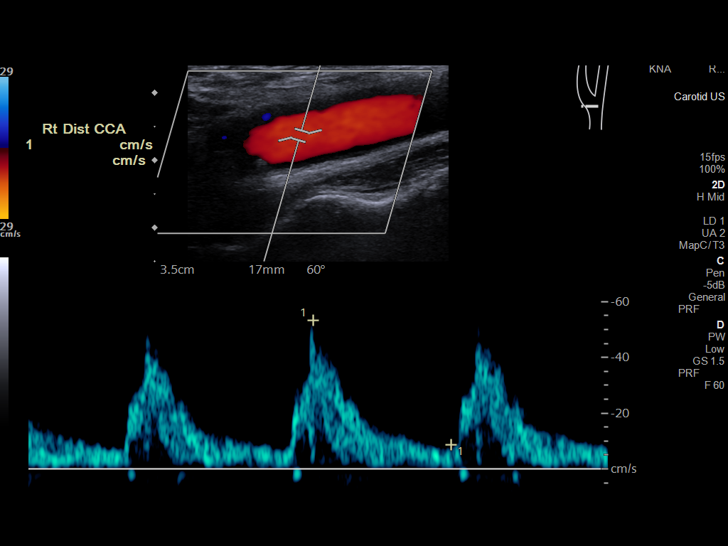
[im 18/68]
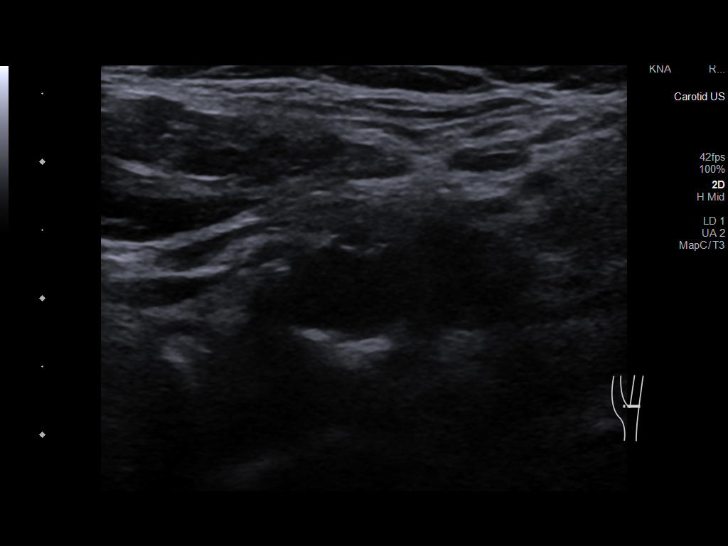
[im 24/68]
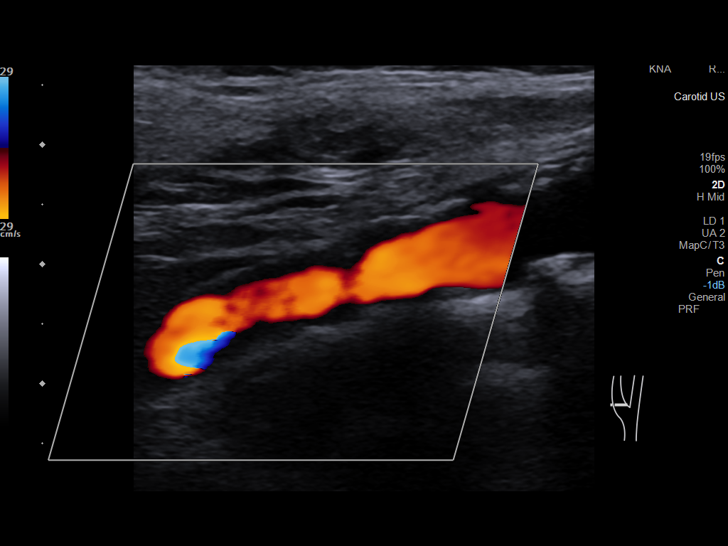
[im 30/68]
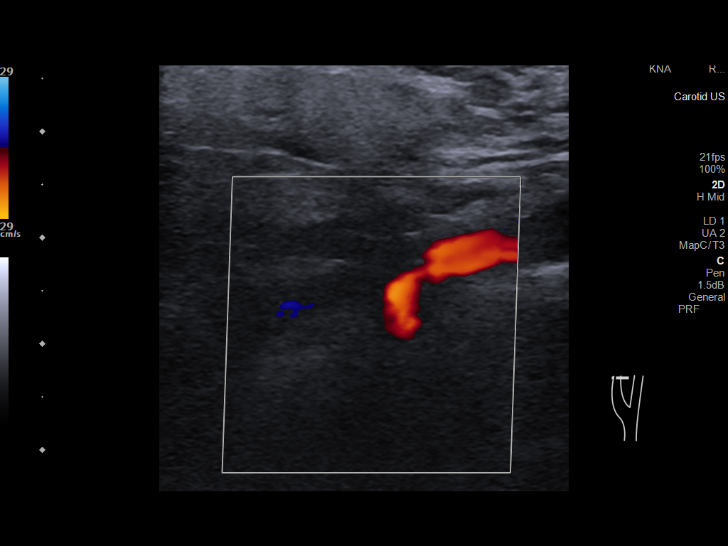
[im 35/68]
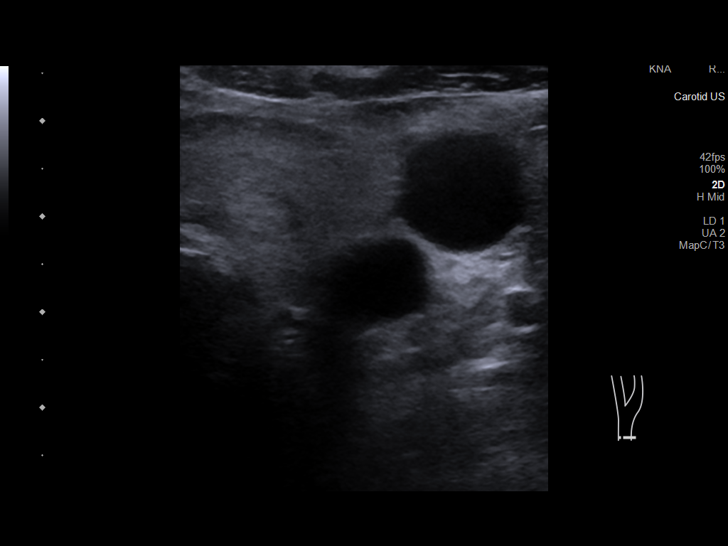
[im 38/68]
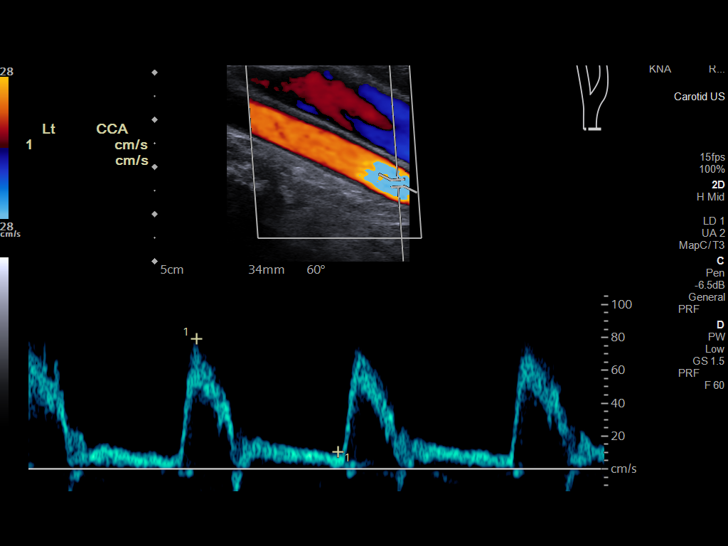
[im 44/68]
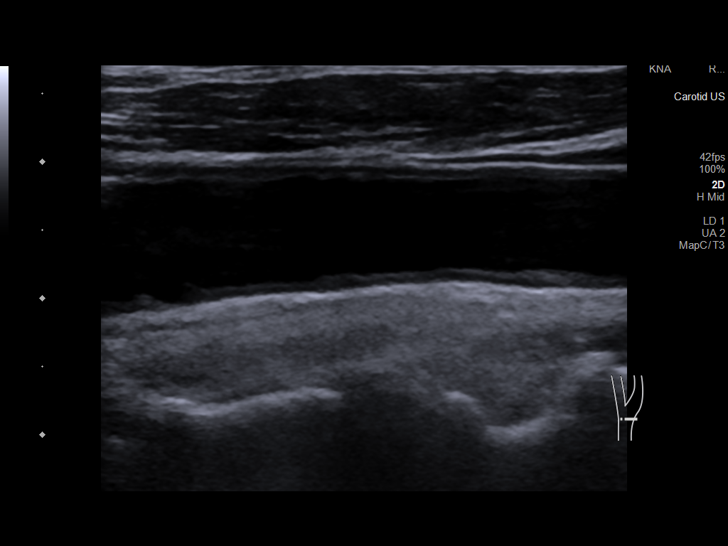
[im 50/68]
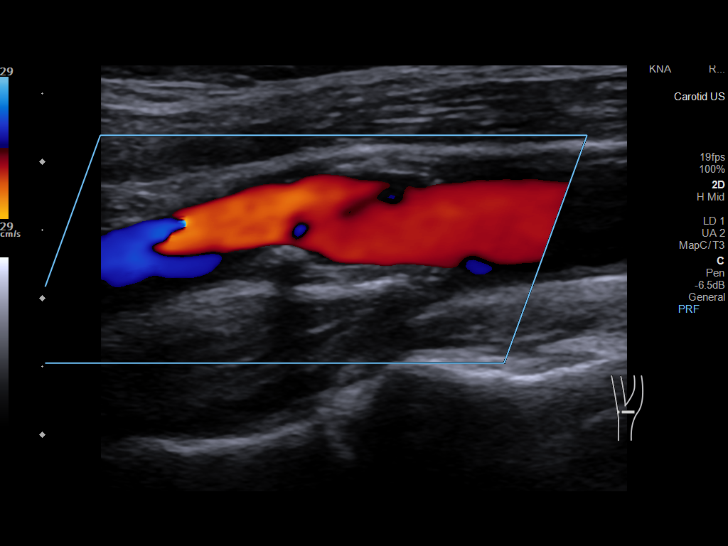
[im 56/68]
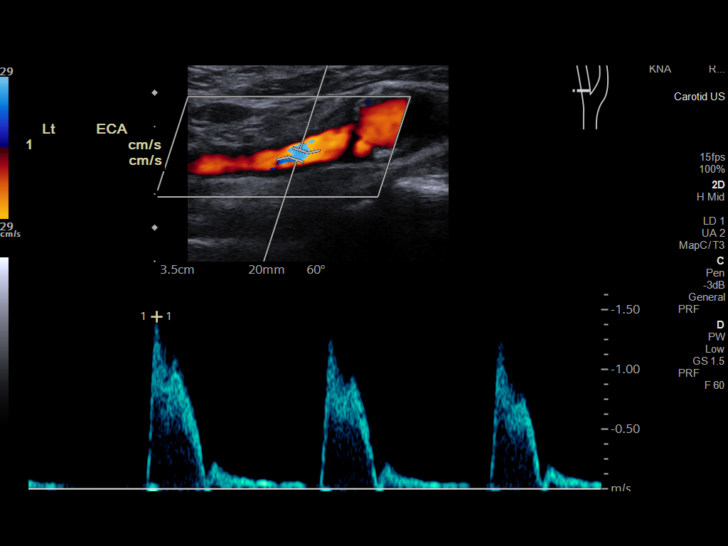
[im 62/68]
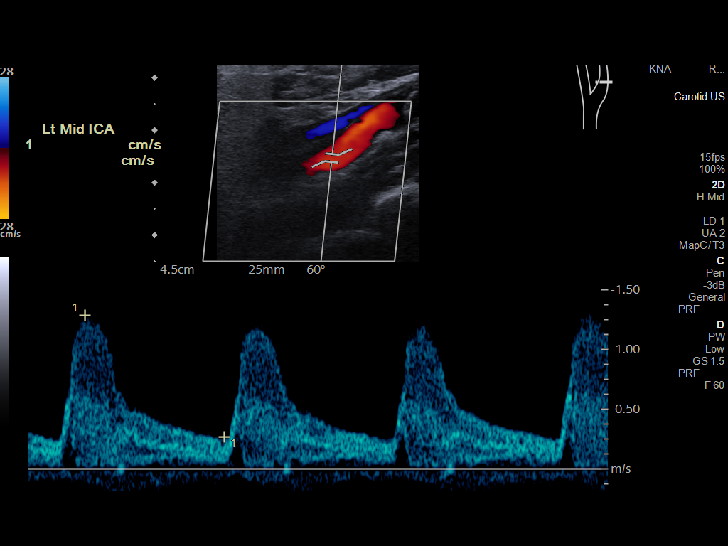
[im 68/68]
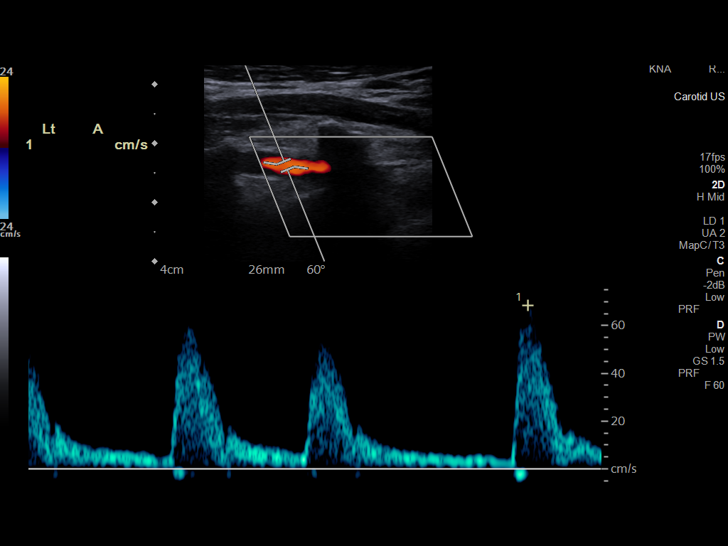

[13 of 24 positions shown; findings below may reference images not displayed]

FINDINGS: Criteria: Quantification of carotid stenosis is based on velocity
parameters that correlate the residual internal carotid diameter
with NASCET-based stenosis levels, using the diameter of the distal
internal carotid lumen as the denominator for stenosis measurement.

The following velocity measurements were obtained:

RIGHT

ICA:  Systolic 85 cm/sec, Diastolic 21 cm/sec

CCA:  47 cm/sec

SYSTOLIC ICA/CCA RATIO:

ECA:  155 cm/sec

LEFT

ICA:  Systolic 143 cm/sec, Diastolic 23 cm/sec

CCA:  67 cm/sec

SYSTOLIC ICA/CCA RATIO:

ECA:  144 cm/sec

Right Brachial SBP: Not acquired

Left Brachial SBP: Not acquired

RIGHT CAROTID ARTERY: No significant calcifications of the right
common carotid artery. Intermediate waveform maintained. Moderate
heterogeneous and partially calcified plaque at the right carotid
bifurcation. No significant lumen shadowing. Low resistance waveform
of the right ICA. No significant tortuosity.

RIGHT VERTEBRAL ARTERY: Antegrade flow with low resistance waveform.

LEFT CAROTID ARTERY: No significant calcifications of the left
common carotid artery. Intermediate waveform maintained. Moderate
heterogeneous and partially calcified plaque at the left carotid
bifurcation. No significant lumen shadowing. Low resistance waveform
of the left ICA. No significant tortuosity.

LEFT VERTEBRAL ARTERY:  Antegrade flow with low resistance waveform.
IMPRESSION: Right:

Color duplex indicates moderate heterogeneous and calcified plaque,
with no hemodynamically significant stenosis by duplex criteria in
the extracranial cerebrovascular circulation.

Left:

Heterogeneous and partially calcified plaque at the left carotid
bifurcation contributes to 50%-69% stenosis by established duplex
criteria

## 2023-08-17 DIAGNOSIS — Z992 Dependence on renal dialysis: Secondary | ICD-10-CM | POA: Diagnosis not present

## 2023-08-17 DIAGNOSIS — N186 End stage renal disease: Secondary | ICD-10-CM | POA: Diagnosis not present

## 2023-08-18 DIAGNOSIS — Z48812 Encounter for surgical aftercare following surgery on the circulatory system: Secondary | ICD-10-CM | POA: Diagnosis not present

## 2023-08-18 DIAGNOSIS — I132 Hypertensive heart and chronic kidney disease with heart failure and with stage 5 chronic kidney disease, or end stage renal disease: Secondary | ICD-10-CM | POA: Diagnosis not present

## 2023-08-18 DIAGNOSIS — Z8673 Personal history of transient ischemic attack (TIA), and cerebral infarction without residual deficits: Secondary | ICD-10-CM | POA: Diagnosis not present

## 2023-08-18 DIAGNOSIS — Z556 Problems related to health literacy: Secondary | ICD-10-CM | POA: Diagnosis not present

## 2023-08-18 DIAGNOSIS — I4811 Longstanding persistent atrial fibrillation: Secondary | ICD-10-CM | POA: Diagnosis not present

## 2023-08-18 DIAGNOSIS — N2581 Secondary hyperparathyroidism of renal origin: Secondary | ICD-10-CM | POA: Diagnosis not present

## 2023-08-18 DIAGNOSIS — K922 Gastrointestinal hemorrhage, unspecified: Secondary | ICD-10-CM | POA: Diagnosis not present

## 2023-08-18 DIAGNOSIS — D631 Anemia in chronic kidney disease: Secondary | ICD-10-CM | POA: Diagnosis not present

## 2023-08-18 DIAGNOSIS — Z7982 Long term (current) use of aspirin: Secondary | ICD-10-CM | POA: Diagnosis not present

## 2023-08-18 DIAGNOSIS — E875 Hyperkalemia: Secondary | ICD-10-CM | POA: Diagnosis not present

## 2023-08-18 DIAGNOSIS — I2510 Atherosclerotic heart disease of native coronary artery without angina pectoris: Secondary | ICD-10-CM | POA: Diagnosis not present

## 2023-08-18 DIAGNOSIS — E782 Mixed hyperlipidemia: Secondary | ICD-10-CM | POA: Diagnosis not present

## 2023-08-18 DIAGNOSIS — I350 Nonrheumatic aortic (valve) stenosis: Secondary | ICD-10-CM | POA: Diagnosis not present

## 2023-08-18 DIAGNOSIS — J449 Chronic obstructive pulmonary disease, unspecified: Secondary | ICD-10-CM | POA: Diagnosis not present

## 2023-08-18 DIAGNOSIS — H3530 Unspecified macular degeneration: Secondary | ICD-10-CM | POA: Diagnosis not present

## 2023-08-18 DIAGNOSIS — K219 Gastro-esophageal reflux disease without esophagitis: Secondary | ICD-10-CM | POA: Diagnosis not present

## 2023-08-18 DIAGNOSIS — S51812D Laceration without foreign body of left forearm, subsequent encounter: Secondary | ICD-10-CM | POA: Diagnosis not present

## 2023-08-18 DIAGNOSIS — G3184 Mild cognitive impairment, so stated: Secondary | ICD-10-CM | POA: Diagnosis not present

## 2023-08-18 DIAGNOSIS — G894 Chronic pain syndrome: Secondary | ICD-10-CM | POA: Diagnosis not present

## 2023-08-18 DIAGNOSIS — I252 Old myocardial infarction: Secondary | ICD-10-CM | POA: Diagnosis not present

## 2023-08-18 DIAGNOSIS — H409 Unspecified glaucoma: Secondary | ICD-10-CM | POA: Diagnosis not present

## 2023-08-18 DIAGNOSIS — I5022 Chronic systolic (congestive) heart failure: Secondary | ICD-10-CM | POA: Diagnosis not present

## 2023-08-18 DIAGNOSIS — Z992 Dependence on renal dialysis: Secondary | ICD-10-CM | POA: Diagnosis not present

## 2023-08-18 DIAGNOSIS — N186 End stage renal disease: Secondary | ICD-10-CM | POA: Diagnosis not present

## 2023-08-18 DIAGNOSIS — Z7902 Long term (current) use of antithrombotics/antiplatelets: Secondary | ICD-10-CM | POA: Diagnosis not present

## 2023-08-18 DIAGNOSIS — Z5982 Transportation insecurity: Secondary | ICD-10-CM | POA: Diagnosis not present

## 2023-08-19 ENCOUNTER — Other Ambulatory Visit: Payer: Self-pay | Admitting: *Deleted

## 2023-08-19 DIAGNOSIS — Z992 Dependence on renal dialysis: Secondary | ICD-10-CM | POA: Diagnosis not present

## 2023-08-19 DIAGNOSIS — N186 End stage renal disease: Secondary | ICD-10-CM | POA: Diagnosis not present

## 2023-08-19 NOTE — Patient Outreach (Signed)
 Complex Care Management   Visit Note  09/22/2023 update note for 08/19/23   Name:  Ronald [name] MRN: [id number] DOB: 1940/10/08  Situation: Referral received for Complex Care Management related to Heart Failure and ESRD I obtained verbal consent from Caregiver.  Visit completed with [name], wife  on the phone  She reports Ronald [name] is restless He was only tolerated 30 minutes of Hemodialysis (HD),today His Blood pressure was low as on Toprol /metoprolol  Voiced understanding of hypotension and fall risks    Wt 212 lb today  Has a BP cuff at home to monitor changes in BP and managing intake of Toprol  + reporting changes in BP to pcp   Background:   Past Medical History:  Diagnosis Date   CHF (congestive heart failure) (HCC)    ESRD on hemodialysis (HCC)    GERD (gastroesophageal reflux disease)    Glaucoma    Hypertension    Macular degeneration    Myocardial infarction [facility])    1999   Prostate cancer (HCC) 02/2018   Stroke (HCC)    no deficits; had stroke while trying to place stents in legs.    Assessment: Patient Reported Symptoms:  Cognitive Cognitive Status: Able to follow simple commands, Difficulties with attention and concentration, Poor judgment in daily scenarios, Requires Assistance Decision Making      Neurological Neurological Review of Symptoms: Weakness, Other: Oher Neurological Symptoms/Conditions [RPT]: She reports Ronald [name] is restless Neurological Management Strategies: Adequate rest, Medication therapy Neurological Self-Management Outcome: 3 (uncertain)  HEENT HEENT Symptoms Reported: No symptoms reported HEENT Self-Management Outcome: 4 (good)    Cardiovascular Cardiovascular Symptoms Reported: Fatigue, Other: Other Cardiovascular Symptoms: low BP in Dialysis Does patient have uncontrolled Hypertension?: No Cardiovascular Management Strategies: Adequate rest, Medication therapy, Routine screening Weight: 212 lb (96.2  kg) Cardiovascular Self-Management Outcome: 3 (uncertain) Cardiovascular Comment: on toprolol/metoprolool    Wt 212 lb today   Has a bp cuff at home  Respiratory Respiratory Symptoms Reported: Shortness of breath, Wheezing Other Respiratory Symptoms: symptoms when lying down Respiratory Management Strategies: Adequate rest, Routine screening Respiratory Self-Management Outcome: 3 (uncertain)  Endocrine Endocrine Symptoms Reported: Shortness of breath, Weakness or fatigue Is patient diabetic?: No Endocrine Self-Management Outcome: 4 (good)  Gastrointestinal Gastrointestinal Symptoms Reported: No symptoms reported Gastrointestinal Management Strategies: Adequate rest Gastrointestinal Self-Management Outcome: 4 (good)    Genitourinary Genitourinary Symptoms Reported: Urgency Other Genitourinary Symptoms: continues to miss days of HD ,goes to bathroom frequently but no outcome Genitourinary Management Strategies: Adequate rest, Hemodialysis, Medication therapy Hemodialysis Last Treatment: 08/19/23 Genitourinary Self-Management Outcome: 3 (uncertain)  Integumentary Integumentary Symptoms Reported: No symptoms reported    Musculoskeletal Musculoskelatal Symptoms Reviewed: Difficulty walking, Joint pain, Limited mobility, Unsteady gait, Weakness Musculoskeletal Management Strategies: Adequate rest, Medical device, Medication therapy, Routine screening Musculoskeletal Self-Management Outcome: 3 (uncertain) Falls in the past year?: Yes Number of falls in past year: 1 or less Was there an injury with Fall?: No Fall Risk Category Calculator: 1 Patient Fall Risk Level: Low Fall Risk Patient at Risk for Falls Due to: History of fall(s), Impaired balance/gait, Impaired mobility, Impaired vision, Mental status change Fall risk Follow up: Falls evaluation completed, Falls prevention discussed  Psychosocial Psychosocial Symptoms Reported: Alteration in sleep habits, Irritability Behavioral Management  Strategies: Adequate rest, Coping strategies, Medication therapy, Support system Behavioral Health Self-Management Outcome: 3 (uncertain) Major Change/Loss/Stressor/Fears (CP): Medical condition, self Techniques to Cope with Loss/Stress/Change: Diversional activities, Medication, Substance use Quality of Family Relationships: helpful, supportive Do you feel physically threatened by others?:  No      09/21/2023   11:53 AM  Depression screen PHQ 2/9  Decreased Interest 2  Down, Depressed, Hopeless 1  PHQ - 2 Score 3  Altered sleeping 2  Tired, decreased energy 2  Change in appetite 1  Feeling bad or failure about yourself  1  Trouble concentrating 2  Moving slowly or fidgety/restless 1  Suicidal thoughts 1  PHQ-9 Score 13  Difficult doing work/chores Somewhat difficult    There were no vitals filed for this visit.  Medications Reviewed Today   Medications were not reviewed in this encounter     Recommendation:   PCP Follow-up Continue Current Plan of Care  Follow Up Plan:   Telephone follow up appointment date/time:  09/18/23 1130   Ronald [name] L. Ramonita, RN, BSN, CCM [place]  [facility], [facility] Health RN Care Manager Direct Dial: [phone]  Fax: [phone]

## 2023-08-20 DIAGNOSIS — E875 Hyperkalemia: Secondary | ICD-10-CM | POA: Diagnosis not present

## 2023-08-20 DIAGNOSIS — K922 Gastrointestinal hemorrhage, unspecified: Secondary | ICD-10-CM | POA: Diagnosis not present

## 2023-08-20 DIAGNOSIS — H409 Unspecified glaucoma: Secondary | ICD-10-CM | POA: Diagnosis not present

## 2023-08-20 DIAGNOSIS — Z7902 Long term (current) use of antithrombotics/antiplatelets: Secondary | ICD-10-CM | POA: Diagnosis not present

## 2023-08-20 DIAGNOSIS — G3184 Mild cognitive impairment, so stated: Secondary | ICD-10-CM | POA: Diagnosis not present

## 2023-08-20 DIAGNOSIS — J449 Chronic obstructive pulmonary disease, unspecified: Secondary | ICD-10-CM | POA: Diagnosis not present

## 2023-08-20 DIAGNOSIS — I2510 Atherosclerotic heart disease of native coronary artery without angina pectoris: Secondary | ICD-10-CM | POA: Diagnosis not present

## 2023-08-20 DIAGNOSIS — I132 Hypertensive heart and chronic kidney disease with heart failure and with stage 5 chronic kidney disease, or end stage renal disease: Secondary | ICD-10-CM | POA: Diagnosis not present

## 2023-08-20 DIAGNOSIS — I5022 Chronic systolic (congestive) heart failure: Secondary | ICD-10-CM | POA: Diagnosis not present

## 2023-08-20 DIAGNOSIS — N186 End stage renal disease: Secondary | ICD-10-CM | POA: Diagnosis not present

## 2023-08-20 DIAGNOSIS — N2581 Secondary hyperparathyroidism of renal origin: Secondary | ICD-10-CM | POA: Diagnosis not present

## 2023-08-20 DIAGNOSIS — Z556 Problems related to health literacy: Secondary | ICD-10-CM | POA: Diagnosis not present

## 2023-08-20 DIAGNOSIS — I252 Old myocardial infarction: Secondary | ICD-10-CM | POA: Diagnosis not present

## 2023-08-20 DIAGNOSIS — Z48812 Encounter for surgical aftercare following surgery on the circulatory system: Secondary | ICD-10-CM | POA: Diagnosis not present

## 2023-08-20 DIAGNOSIS — E782 Mixed hyperlipidemia: Secondary | ICD-10-CM | POA: Diagnosis not present

## 2023-08-20 DIAGNOSIS — S51812D Laceration without foreign body of left forearm, subsequent encounter: Secondary | ICD-10-CM | POA: Diagnosis not present

## 2023-08-20 DIAGNOSIS — D631 Anemia in chronic kidney disease: Secondary | ICD-10-CM | POA: Diagnosis not present

## 2023-08-20 DIAGNOSIS — G894 Chronic pain syndrome: Secondary | ICD-10-CM | POA: Diagnosis not present

## 2023-08-20 DIAGNOSIS — H3530 Unspecified macular degeneration: Secondary | ICD-10-CM | POA: Diagnosis not present

## 2023-08-20 DIAGNOSIS — Z992 Dependence on renal dialysis: Secondary | ICD-10-CM | POA: Diagnosis not present

## 2023-08-20 DIAGNOSIS — Z5982 Transportation insecurity: Secondary | ICD-10-CM | POA: Diagnosis not present

## 2023-08-20 DIAGNOSIS — Z7982 Long term (current) use of aspirin: Secondary | ICD-10-CM | POA: Diagnosis not present

## 2023-08-20 DIAGNOSIS — I4811 Longstanding persistent atrial fibrillation: Secondary | ICD-10-CM | POA: Diagnosis not present

## 2023-08-20 DIAGNOSIS — K219 Gastro-esophageal reflux disease without esophagitis: Secondary | ICD-10-CM | POA: Diagnosis not present

## 2023-08-20 DIAGNOSIS — Z8673 Personal history of transient ischemic attack (TIA), and cerebral infarction without residual deficits: Secondary | ICD-10-CM | POA: Diagnosis not present

## 2023-08-20 DIAGNOSIS — I350 Nonrheumatic aortic (valve) stenosis: Secondary | ICD-10-CM | POA: Diagnosis not present

## 2023-08-21 DIAGNOSIS — N186 End stage renal disease: Secondary | ICD-10-CM | POA: Diagnosis not present

## 2023-08-21 DIAGNOSIS — Z992 Dependence on renal dialysis: Secondary | ICD-10-CM | POA: Diagnosis not present

## 2023-08-24 DIAGNOSIS — Z992 Dependence on renal dialysis: Secondary | ICD-10-CM | POA: Diagnosis not present

## 2023-08-24 DIAGNOSIS — N186 End stage renal disease: Secondary | ICD-10-CM | POA: Diagnosis not present

## 2023-08-26 DIAGNOSIS — N186 End stage renal disease: Secondary | ICD-10-CM | POA: Diagnosis not present

## 2023-08-26 DIAGNOSIS — Z992 Dependence on renal dialysis: Secondary | ICD-10-CM | POA: Diagnosis not present

## 2023-08-28 DIAGNOSIS — N186 End stage renal disease: Secondary | ICD-10-CM | POA: Diagnosis not present

## 2023-08-28 DIAGNOSIS — Z992 Dependence on renal dialysis: Secondary | ICD-10-CM | POA: Diagnosis not present

## 2023-08-31 DIAGNOSIS — N186 End stage renal disease: Secondary | ICD-10-CM | POA: Diagnosis not present

## 2023-08-31 DIAGNOSIS — Z992 Dependence on renal dialysis: Secondary | ICD-10-CM | POA: Diagnosis not present

## 2023-09-02 DIAGNOSIS — Z992 Dependence on renal dialysis: Secondary | ICD-10-CM | POA: Diagnosis not present

## 2023-09-02 DIAGNOSIS — N186 End stage renal disease: Secondary | ICD-10-CM | POA: Diagnosis not present

## 2023-09-04 DIAGNOSIS — N186 End stage renal disease: Secondary | ICD-10-CM | POA: Diagnosis not present

## 2023-09-04 DIAGNOSIS — Z992 Dependence on renal dialysis: Secondary | ICD-10-CM | POA: Diagnosis not present

## 2023-09-07 DIAGNOSIS — Z992 Dependence on renal dialysis: Secondary | ICD-10-CM | POA: Diagnosis not present

## 2023-09-07 DIAGNOSIS — N186 End stage renal disease: Secondary | ICD-10-CM | POA: Diagnosis not present

## 2023-09-08 NOTE — Patient Instructions (Signed)
 Visit Information  Thank you for taking time to visit with me today. Please don't hesitate to contact me if I can be of assistance to you before our next scheduled appointment.  Your next care management appointment is by telephone on 09/18/23 at 1130    Please call the care guide team at [phone] if you need to cancel, schedule, or reschedule an appointment.   Please call the Suicide and Crisis Lifeline: 988 call the [place]  National Suicide Prevention Lifeline: 971-803-3595 or TTY: 8633628064 TTY 912-716-0544) to talk to a trained counselor call 1-800-273-TALK (toll free, 24 hour hotline) call the [facility]: [phone] call 911 if you are experiencing a Mental Health or Behavioral Health Crisis or need someone to talk to.  Mikkel Charrette L. Ramonita, RN, BSN, CCM [place]  [facility], [facility] Health RN Care Manager Direct Dial: [phone]  Fax: [phone]

## 2023-09-09 DIAGNOSIS — Z992 Dependence on renal dialysis: Secondary | ICD-10-CM | POA: Diagnosis not present

## 2023-09-09 DIAGNOSIS — N186 End stage renal disease: Secondary | ICD-10-CM | POA: Diagnosis not present

## 2023-09-10 DIAGNOSIS — N186 End stage renal disease: Secondary | ICD-10-CM | POA: Diagnosis not present

## 2023-09-10 DIAGNOSIS — Z992 Dependence on renal dialysis: Secondary | ICD-10-CM | POA: Diagnosis not present

## 2023-09-11 DIAGNOSIS — N186 End stage renal disease: Secondary | ICD-10-CM | POA: Diagnosis not present

## 2023-09-11 DIAGNOSIS — Z992 Dependence on renal dialysis: Secondary | ICD-10-CM | POA: Diagnosis not present

## 2023-09-14 DIAGNOSIS — Z992 Dependence on renal dialysis: Secondary | ICD-10-CM | POA: Diagnosis not present

## 2023-09-14 DIAGNOSIS — N186 End stage renal disease: Secondary | ICD-10-CM | POA: Diagnosis not present

## 2023-09-16 DIAGNOSIS — Z992 Dependence on renal dialysis: Secondary | ICD-10-CM | POA: Diagnosis not present

## 2023-09-16 DIAGNOSIS — N186 End stage renal disease: Secondary | ICD-10-CM | POA: Diagnosis not present

## 2023-09-18 ENCOUNTER — Encounter: Payer: Self-pay | Admitting: *Deleted

## 2023-09-18 ENCOUNTER — Telehealth: Payer: Self-pay

## 2023-09-18 ENCOUNTER — Telehealth: Payer: Self-pay | Admitting: *Deleted

## 2023-09-18 ENCOUNTER — Other Ambulatory Visit: Payer: Self-pay | Admitting: *Deleted

## 2023-09-18 DIAGNOSIS — Z992 Dependence on renal dialysis: Secondary | ICD-10-CM | POA: Diagnosis not present

## 2023-09-18 DIAGNOSIS — N186 End stage renal disease: Secondary | ICD-10-CM | POA: Diagnosis not present

## 2023-09-18 NOTE — Progress Notes (Signed)
 Complex Care Management Care Guide Note  09/18/2023 Name: KRAVEN CALK MRN: [id number] DOB: 09/07/1940  Ebrima Ranta Herberg is a 83 y.o. year old male who is a primary care patient of Shona, Norleen PEDLAR, MD and is actively engaged with the care management team. I reached out to Valentin JINNY Mungo by phone today to assist with re-scheduling  with the RN Case Manager.  Follow up plan: Unsuccessful telephone outreach attempt made. A HIPAA compliant phone message was left for the patient providing contact information and requesting a return call.  Leotis Rase [facility], [facility] Guide  Direct Dial: [phone]  Fax [phone]

## 2023-09-18 NOTE — Patient Instructions (Signed)
 Caelen J Furgason - I am sorry I was unable to reach you today for our scheduled appointment. I work with Shona, Norleen PEDLAR, MD and am calling to support your healthcare needs. Please contact me at [phone] at your earliest convenience. I look forward to speaking with you soon.   Thank you,  Suzen L. Ramonita, RN, BSN, CCM [place]  [facility], [facility] RN Care Manager Direct Dial: [phone]  Fax: [phone]

## 2023-09-21 ENCOUNTER — Encounter: Payer: Self-pay | Admitting: *Deleted

## 2023-09-21 NOTE — Patient Outreach (Signed)
 Complex Care Management   Visit Note  09/21/2023  Name:  Ronald [name] MRN: [id number] DOB: [birth year]  Situation: Referral received for Complex Care Management related to ESRD I obtained verbal consent from Caregiver.  Visit completed with [name]  on the phone  Patient is not doing well Many family members (wife's brother, daughter, son, grand kids) have been visiting during the last few weeks and are continuing to visit but this becomes a burden per wife when she has to care for patient and entertain family  Has grandchildren kids ages 44, 54, 86- he is not recognizing & interacting with per wife  He is declining as described per wife  He is not speaking or interacting well He is not remembering family members now Playing with his hand, getting weaker- She is having to follow him to make sure he is completing toileting, tidying up No support help for wife being offered per wife  Patient continues to not sleep well. She reports he is now wanting her to lie in the bed with him for him to remain in bed . She reports if she leaves the bedroom he will eventually get out of bed and come where ever she is  [facility]  Wife reports she is not forcing him to go to dialysis, she still transport him to HD, he is not able to go with transportation as she wants to be there as soon as he is finishes, to make sure he stays to get as much services as possible Is not having decreased bilateral leg edema  24/7 Helpline: [employer], a free and confidential 24/7 Helpline at 224 014 6639 offered to the wife but reports she prefers outreaches from the RN CCM  Offered to help set up family conference but not preferred  Voiced understanding that hospice services is free, no financial cost as paid by the insurance coverage. Prefers to call local palliative/hospice staff when she is able to re review this and get services for patient. Reports she still  has [facility] number   Background:   Past Medical History:  Diagnosis Date   CHF (congestive heart failure) (HCC)    ESRD on hemodialysis (HCC)    GERD (gastroesophageal reflux disease)    Glaucoma    Hypertension    Macular degeneration    Myocardial infarction [facility])    1999   Prostate cancer (HCC) 02/2018   Stroke (HCC)    no deficits; had stroke while trying to place stents in legs.    Assessment: Patient Reported Symptoms:  Cognitive Cognitive Status: Confused or disoriented, Difficulties with attention and concentration, Poor judgment in daily scenarios, Struggling with memory recall, Other: (He is declining   He is not speaking or interaction  He is not remember/recognizing family members now  Playing with his hand, getting weaker-) Cognitive/Intellectual Conditions Management [RPT]: Other Other: dementia   Health Maintenance Behaviors: Sleep adequate Healing Pattern: Average Health Facilitated by: Rest  Neurological Neurological Review of Symptoms: Weakness Neurological Self-Management Outcome: 3 (uncertain)  HEENT HEENT Symptoms Reported: No symptoms reported HEENT Self-Management Outcome: 3 (uncertain) Ear problem(s), Vision problem(s)  Cardiovascular Cardiovascular Symptoms Reported: Swelling in legs or feet, Fatigue Cardiovascular Management Strategies: Adequate rest, Medication therapy Cardiovascular Self-Management Outcome: 3 (uncertain)  Respiratory Respiratory Symptoms Reported: Shortness of breath Respiratory Management Strategies: Adequate rest Respiratory Self-Management Outcome: 3 (uncertain)  Endocrine Endocrine Symptoms Reported: Weakness or fatigue Is patient diabetic?: No Endocrine Self-Management Outcome: 3 (uncertain)  Gastrointestinal Gastrointestinal Symptoms  Reported: Abdominal pain or discomfort Gastrointestinal Management Strategies: Adequate rest Gastrointestinal Self-Management Outcome: 3 (uncertain)    Genitourinary Genitourinary  Symptoms Reported: Frequency Genitourinary Management Strategies: Hemodialysis, Adequate rest, Medication therapy Genitourinary Self-Management Outcome: 3 (uncertain)  Integumentary Integumentary Symptoms Reported: No symptoms reported Skin Self-Management Outcome: 4 (good)  Musculoskeletal Musculoskelatal Symptoms Reviewed: Limited mobility, Unsteady gait, Weakness Musculoskeletal Management Strategies: Adequate rest, Medical device Musculoskeletal Self-Management Outcome: 3 (uncertain)      Psychosocial Psychosocial Symptoms Reported: Alteration in sleep habits, Difficulty concentrating, Flat affect, Other Other Psychosocial Conditions: He is declining   He is not speaking or interaction  He is not remember family members now  Playing with his hand, getting weaker- Behavioral Management Strategies: Adequate rest, Coping strategies, Medication therapy, Support system Behavioral Health Self-Management Outcome: 3 (uncertain) Major Change/Loss/Stressor/Fears (CP): Medical condition, self Techniques to Cope with Loss/Stress/Change: Diversional activities, Medication, Substance use Quality of Family Relationships: helpful, supportive Do you feel physically threatened by others?: No      08/12/2023    4:58 PM  Depression screen PHQ 2/9  Decreased Interest 1  Down, Depressed, Hopeless 1  PHQ - 2 Score 2  Altered sleeping 1  Tired, decreased energy 1  Change in appetite 1  Feeling bad or failure about yourself  1  Trouble concentrating 1  Moving slowly or fidgety/restless 1  Suicidal thoughts 1  PHQ-9 Score 9  Difficult doing work/chores Somewhat difficult    There were no vitals filed for this visit.  Medications Reviewed Today   Medications were not reviewed in this encounter     Recommendation:   PCP Follow-up Continue Current Plan of Care Update pcp, local palliative/hospice care staff  Follow Up Plan:   Telephone follow up appointment date/time:  10/05/23 3:15  pm  Suzen L. Ramonita, RN, BSN, CCM [place]  [facility], [facility] RN Care Manager Direct Dial: [phone]  Fax: [phone]

## 2023-09-21 NOTE — Patient Instructions (Signed)
 Visit Information  Thank you for taking time to visit with me today. Please don't hesitate to contact me if I can be of assistance to you before our next scheduled appointment.  Your next care management appointment is by telephone on 10/05/23 at 3:15 pm    Please call the care guide team at [phone] if you need to cancel, schedule, or reschedule an appointment.   Please call the Suicide and Crisis Lifeline: 988 call the [place]  National Suicide Prevention Lifeline: (330)022-1619 or TTY: 806-046-1463 TTY 878-218-0385) to talk to a trained counselor call 1-800-273-TALK (toll free, 24 hour hotline) call the [facility]: 417-502-9259 call 911 if you are experiencing a Mental Health or Behavioral Health Crisis or need someone to talk to.  Jeydan Barner L. Ramonita, RN, BSN, CCM [place]  [facility], [facility] RN Care Manager Direct Dial: [phone]  Fax: [phone]

## 2023-09-23 ENCOUNTER — Emergency Department (HOSPITAL_COMMUNITY)

## 2023-09-23 ENCOUNTER — Inpatient Hospital Stay (HOSPITAL_COMMUNITY)
Admission: EM | Admit: 2023-09-23 | Discharge: 2023-09-25 | DRG: 291 | Disposition: A | Discharge status: Home / Self Care | Source: Ambulatory Visit | Attending: Internal Medicine | Admitting: Internal Medicine

## 2023-09-23 ENCOUNTER — Encounter (HOSPITAL_COMMUNITY): Payer: Self-pay | Admitting: Pharmacy Technician

## 2023-09-23 ENCOUNTER — Other Ambulatory Visit: Payer: Self-pay

## 2023-09-23 DIAGNOSIS — H3530 Unspecified macular degeneration: Secondary | ICD-10-CM | POA: Diagnosis present

## 2023-09-23 DIAGNOSIS — E669 Obesity, unspecified: Secondary | ICD-10-CM | POA: Diagnosis present

## 2023-09-23 DIAGNOSIS — R0989 Other specified symptoms and signs involving the circulatory and respiratory systems: Secondary | ICD-10-CM | POA: Diagnosis not present

## 2023-09-23 DIAGNOSIS — Z7951 Long term (current) use of inhaled steroids: Secondary | ICD-10-CM

## 2023-09-23 DIAGNOSIS — G4700 Insomnia, unspecified: Secondary | ICD-10-CM | POA: Diagnosis present

## 2023-09-23 DIAGNOSIS — Z95820 Peripheral vascular angioplasty status with implants and grafts: Secondary | ICD-10-CM

## 2023-09-23 DIAGNOSIS — J441 Chronic obstructive pulmonary disease with (acute) exacerbation: Secondary | ICD-10-CM | POA: Diagnosis not present

## 2023-09-23 DIAGNOSIS — I132 Hypertensive heart and chronic kidney disease with heart failure and with stage 5 chronic kidney disease, or end stage renal disease: Principal | ICD-10-CM | POA: Diagnosis present

## 2023-09-23 DIAGNOSIS — Z8673 Personal history of transient ischemic attack (TIA), and cerebral infarction without residual deficits: Secondary | ICD-10-CM

## 2023-09-23 DIAGNOSIS — I959 Hypotension, unspecified: Secondary | ICD-10-CM | POA: Diagnosis present

## 2023-09-23 DIAGNOSIS — Z7189 Other specified counseling: Secondary | ICD-10-CM | POA: Diagnosis not present

## 2023-09-23 DIAGNOSIS — E871 Hypo-osmolality and hyponatremia: Secondary | ICD-10-CM | POA: Diagnosis not present

## 2023-09-23 DIAGNOSIS — Z66 Do not resuscitate: Secondary | ICD-10-CM | POA: Diagnosis present

## 2023-09-23 DIAGNOSIS — Z992 Dependence on renal dialysis: Secondary | ICD-10-CM | POA: Diagnosis not present

## 2023-09-23 DIAGNOSIS — E875 Hyperkalemia: Secondary | ICD-10-CM | POA: Diagnosis present

## 2023-09-23 DIAGNOSIS — I252 Old myocardial infarction: Secondary | ICD-10-CM

## 2023-09-23 DIAGNOSIS — K219 Gastro-esophageal reflux disease without esophagitis: Secondary | ICD-10-CM | POA: Diagnosis not present

## 2023-09-23 DIAGNOSIS — E8770 Fluid overload, unspecified: Secondary | ICD-10-CM

## 2023-09-23 DIAGNOSIS — R0902 Hypoxemia: Secondary | ICD-10-CM | POA: Diagnosis not present

## 2023-09-23 DIAGNOSIS — F17210 Nicotine dependence, cigarettes, uncomplicated: Secondary | ICD-10-CM | POA: Diagnosis present

## 2023-09-23 DIAGNOSIS — I493 Ventricular premature depolarization: Secondary | ICD-10-CM | POA: Diagnosis present

## 2023-09-23 DIAGNOSIS — Z6827 Body mass index (BMI) 27.0-27.9, adult: Secondary | ICD-10-CM | POA: Diagnosis not present

## 2023-09-23 DIAGNOSIS — Z7902 Long term (current) use of antithrombotics/antiplatelets: Secondary | ICD-10-CM

## 2023-09-23 DIAGNOSIS — J479 Bronchiectasis, uncomplicated: Secondary | ICD-10-CM | POA: Diagnosis present

## 2023-09-23 DIAGNOSIS — Z515 Encounter for palliative care: Secondary | ICD-10-CM

## 2023-09-23 DIAGNOSIS — F05 Delirium due to known physiological condition: Secondary | ICD-10-CM | POA: Diagnosis not present

## 2023-09-23 DIAGNOSIS — J9601 Acute respiratory failure with hypoxia: Secondary | ICD-10-CM | POA: Diagnosis not present

## 2023-09-23 DIAGNOSIS — N289 Disorder of kidney and ureter, unspecified: Secondary | ICD-10-CM | POA: Diagnosis not present

## 2023-09-23 DIAGNOSIS — F03918 Unspecified dementia, unspecified severity, with other behavioral disturbance: Secondary | ICD-10-CM | POA: Diagnosis present

## 2023-09-23 DIAGNOSIS — R918 Other nonspecific abnormal finding of lung field: Secondary | ICD-10-CM | POA: Diagnosis not present

## 2023-09-23 DIAGNOSIS — Z743 Need for continuous supervision: Secondary | ICD-10-CM | POA: Diagnosis not present

## 2023-09-23 DIAGNOSIS — Z91158 Patient's noncompliance with renal dialysis for other reason: Secondary | ICD-10-CM

## 2023-09-23 DIAGNOSIS — Z79899 Other long term (current) drug therapy: Secondary | ICD-10-CM

## 2023-09-23 DIAGNOSIS — Z951 Presence of aortocoronary bypass graft: Secondary | ICD-10-CM

## 2023-09-23 DIAGNOSIS — I5042 Chronic combined systolic (congestive) and diastolic (congestive) heart failure: Secondary | ICD-10-CM | POA: Diagnosis present

## 2023-09-23 DIAGNOSIS — I482 Chronic atrial fibrillation, unspecified: Secondary | ICD-10-CM | POA: Diagnosis not present

## 2023-09-23 DIAGNOSIS — D72819 Decreased white blood cell count, unspecified: Secondary | ICD-10-CM | POA: Diagnosis not present

## 2023-09-23 DIAGNOSIS — F03911 Unspecified dementia, unspecified severity, with agitation: Secondary | ICD-10-CM | POA: Diagnosis present

## 2023-09-23 DIAGNOSIS — N179 Acute kidney failure, unspecified: Secondary | ICD-10-CM | POA: Diagnosis not present

## 2023-09-23 DIAGNOSIS — N186 End stage renal disease: Secondary | ICD-10-CM | POA: Diagnosis present

## 2023-09-23 DIAGNOSIS — I5030 Unspecified diastolic (congestive) heart failure: Secondary | ICD-10-CM | POA: Diagnosis not present

## 2023-09-23 DIAGNOSIS — D631 Anemia in chronic kidney disease: Secondary | ICD-10-CM | POA: Diagnosis not present

## 2023-09-23 DIAGNOSIS — H409 Unspecified glaucoma: Secondary | ICD-10-CM | POA: Diagnosis present

## 2023-09-23 DIAGNOSIS — Z8679 Personal history of other diseases of the circulatory system: Secondary | ICD-10-CM

## 2023-09-23 DIAGNOSIS — Z8546 Personal history of malignant neoplasm of prostate: Secondary | ICD-10-CM

## 2023-09-23 DIAGNOSIS — R0603 Acute respiratory distress: Secondary | ICD-10-CM | POA: Diagnosis present

## 2023-09-23 DIAGNOSIS — E785 Hyperlipidemia, unspecified: Secondary | ICD-10-CM | POA: Diagnosis not present

## 2023-09-23 DIAGNOSIS — R0600 Dyspnea, unspecified: Secondary | ICD-10-CM | POA: Diagnosis not present

## 2023-09-23 DIAGNOSIS — I447 Left bundle-branch block, unspecified: Secondary | ICD-10-CM | POA: Diagnosis present

## 2023-09-23 DIAGNOSIS — Z558 Other problems related to education and literacy: Secondary | ICD-10-CM | POA: Diagnosis not present

## 2023-09-23 DIAGNOSIS — I517 Cardiomegaly: Secondary | ICD-10-CM | POA: Diagnosis not present

## 2023-09-23 LAB — CBC WITH DIFFERENTIAL/PLATELET
Abs Immature Granulocytes: 0.04 K/uL (ref 0.00–0.07)
Basophils Absolute: 0 K/uL (ref 0.0–0.1)
Basophils Relative: 0 %
Eosinophils Absolute: 0 K/uL (ref 0.0–0.5)
Eosinophils Relative: 1 %
HCT: 30 % — ABNORMAL LOW (ref 39.0–52.0)
Hemoglobin: 9.4 g/dL — ABNORMAL LOW (ref 13.0–17.0)
Immature Granulocytes: 1 %
Lymphocytes Relative: 34 %
Lymphs Abs: 1.1 K/uL (ref 0.7–4.0)
MCH: 31.5 pg (ref 26.0–34.0)
MCHC: 31.3 g/dL (ref 30.0–36.0)
MCV: 100.7 fL — ABNORMAL HIGH (ref 80.0–100.0)
Monocytes Absolute: 0.4 K/uL (ref 0.1–1.0)
Monocytes Relative: 11 %
Neutro Abs: 1.8 K/uL (ref 1.7–7.7)
Neutrophils Relative %: 53 %
Platelets: 138 K/uL — ABNORMAL LOW (ref 150–400)
RBC: 2.98 MIL/uL — ABNORMAL LOW (ref 4.22–5.81)
RDW: 17.2 % — ABNORMAL HIGH (ref 11.5–15.5)
WBC: 3.3 K/uL — ABNORMAL LOW (ref 4.0–10.5)
nRBC: 0 % (ref 0.0–0.2)

## 2023-09-23 LAB — BLOOD GAS, VENOUS
Acid-Base Excess: 1.7 mmol/L (ref 0.0–2.0)
Bicarbonate: 28.1 mmol/L — ABNORMAL HIGH (ref 20.0–28.0)
Drawn by: 442
O2 Saturation: 23.9 %
Patient temperature: 36.8
pCO2, Ven: 51 mmHg (ref 44–60)
pH, Ven: 7.34 (ref 7.25–7.43)
pO2, Ven: <31 mmHg — CL (ref 32–45)

## 2023-09-23 LAB — COMPREHENSIVE METABOLIC PANEL WITH GFR
ALT: 22 U/L (ref 0–44)
AST: 34 U/L (ref 15–41)
Albumin: 2.9 g/dL — ABNORMAL LOW (ref 3.5–5.0)
Alkaline Phosphatase: 124 U/L (ref 38–126)
Anion gap: 18 — ABNORMAL HIGH (ref 5–15)
BUN: 44 mg/dL — ABNORMAL HIGH (ref 8–23)
CO2: 23 mmol/L (ref 22–32)
Calcium: 7.7 mg/dL — ABNORMAL LOW (ref 8.9–10.3)
Chloride: 93 mmol/L — ABNORMAL LOW (ref 98–111)
Creatinine, Ser: 7.59 mg/dL — ABNORMAL HIGH (ref 0.61–1.24)
GFR, Estimated: 7 mL/min — ABNORMAL LOW (ref >60)
Glucose, Bld: 79 mg/dL (ref 70–99)
Potassium: 4.8 mmol/L (ref 3.5–5.1)
Sodium: 134 mmol/L — ABNORMAL LOW (ref 135–145)
Total Bilirubin: 0.8 mg/dL (ref 0.0–1.2)
Total Protein: 6 g/dL — ABNORMAL LOW (ref 6.5–8.1)

## 2023-09-23 LAB — CBC
HCT: 28.9 % — ABNORMAL LOW (ref 39.0–52.0)
Hemoglobin: 9.1 g/dL — ABNORMAL LOW (ref 13.0–17.0)
MCH: 31.5 pg (ref 26.0–34.0)
MCHC: 31.5 g/dL (ref 30.0–36.0)
MCV: 100 fL (ref 80.0–100.0)
Platelets: 132 K/uL — ABNORMAL LOW (ref 150–400)
RBC: 2.89 MIL/uL — ABNORMAL LOW (ref 4.22–5.81)
RDW: 17.2 % — ABNORMAL HIGH (ref 11.5–15.5)
WBC: 3.4 K/uL — ABNORMAL LOW (ref 4.0–10.5)
nRBC: 0 % (ref 0.0–0.2)

## 2023-09-23 LAB — LACTIC ACID, PLASMA
Lactic Acid, Venous: 1.6 mmol/L (ref 0.5–1.9)
Lactic Acid, Venous: 1.5 mmol/L (ref 0.5–1.9)

## 2023-09-23 LAB — CREATININE, SERUM
Creatinine, Ser: 8 mg/dL — ABNORMAL HIGH (ref 0.61–1.24)
GFR, Estimated: 6 mL/min — ABNORMAL LOW (ref >60)

## 2023-09-23 LAB — PROTIME-INR
INR: 1.1 (ref 0.8–1.2)
Prothrombin Time: 15.2 s (ref 11.4–15.2)

## 2023-09-23 LAB — BRAIN NATRIURETIC PEPTIDE: B Natriuretic Peptide: 4030 pg/mL — ABNORMAL HIGH (ref 0.0–100.0)

## 2023-09-23 MED ORDER — PANTOPRAZOLE SODIUM 40 MG PO TBEC
40.0000 mg | DELAYED_RELEASE_TABLET | Freq: Every day | ORAL | Status: DC
  Administered 2023-09-23 – 2023-09-25 (×4): 40 mg via ORAL
  Filled 2023-09-23 (×4): qty 1

## 2023-09-23 MED ORDER — ONDANSETRON HCL 4 MG/2ML IJ SOLN: 4.0000 mg | Freq: Four times a day (QID) | INTRAMUSCULAR | Status: DC | PRN

## 2023-09-23 MED ORDER — ONDANSETRON HCL 4 MG PO TABS: 4.0000 mg | ORAL_TABLET | Freq: Four times a day (QID) | ORAL | Status: DC | PRN

## 2023-09-23 MED ORDER — DOXYCYCLINE HYCLATE 100 MG PO TABS
100.0000 mg | ORAL_TABLET | Freq: Two times a day (BID) | ORAL | Status: DC
  Administered 2023-09-23 – 2023-09-25 (×5): 100 mg via ORAL
  Filled 2023-09-23 (×6): qty 1

## 2023-09-23 MED ORDER — NICOTINE 14 MG/24HR TD PT24
14.0000 mg | MEDICATED_PATCH | Freq: Every day | TRANSDERMAL | Status: DC
  Administered 2023-09-23 – 2023-09-25 (×4): 14 mg via TRANSDERMAL
  Filled 2023-09-23 (×3): qty 1

## 2023-09-23 MED ORDER — ACETAMINOPHEN 325 MG PO TABS
650.0000 mg | ORAL_TABLET | Freq: Four times a day (QID) | ORAL | Status: DC | PRN
  Administered 2023-09-24 – 2023-09-25 (×2): 650 mg via ORAL
  Filled 2023-09-23 (×3): qty 2

## 2023-09-23 MED ORDER — BUDESONIDE 0.5 MG/2ML IN SUSP
0.5000 mg | Freq: Two times a day (BID) | RESPIRATORY_TRACT | Status: DC
  Administered 2023-09-23 – 2023-09-24 (×3): 0.5 mg via RESPIRATORY_TRACT
  Filled 2023-09-23 (×4): qty 2

## 2023-09-23 MED ORDER — SODIUM CHLORIDE 0.9 % IV SOLN: 250.0000 mL | INTRAVENOUS | Status: AC | PRN

## 2023-09-23 MED ORDER — FUROSEMIDE 10 MG/ML IJ SOLN
80.0000 mg | Freq: Once | INTRAMUSCULAR | Status: AC
  Administered 2023-09-23 (×2): 80 mg via INTRAVENOUS
  Filled 2023-09-23: qty 8

## 2023-09-23 MED ORDER — SODIUM CHLORIDE 0.9% FLUSH
3.0000 mL | Freq: Two times a day (BID) | INTRAVENOUS | Status: DC
  Administered 2023-09-23 – 2023-09-25 (×4): 3 mL via INTRAVENOUS

## 2023-09-23 MED ORDER — CHLORHEXIDINE GLUCONATE CLOTH 2 % EX PADS
6.0000 | MEDICATED_PAD | Freq: Every day | CUTANEOUS | Status: DC
  Administered 2023-09-24: 6 via TOPICAL

## 2023-09-23 MED ORDER — ACETAMINOPHEN 650 MG RE SUPP: 650.0000 mg | Freq: Four times a day (QID) | RECTAL | Status: DC | PRN

## 2023-09-23 MED ORDER — HEPARIN SODIUM (PORCINE) 5000 UNIT/ML IJ SOLN
5000.0000 [IU] | Freq: Three times a day (TID) | INTRAMUSCULAR | Status: DC
  Administered 2023-09-24 (×2): 5000 [IU] via SUBCUTANEOUS
  Filled 2023-09-23 (×3): qty 1

## 2023-09-23 MED ORDER — IPRATROPIUM-ALBUTEROL 0.5-2.5 (3) MG/3ML IN SOLN
3.0000 mL | Freq: Once | RESPIRATORY_TRACT | Status: AC
  Administered 2023-09-23 (×2): 3 mL via RESPIRATORY_TRACT
  Filled 2023-09-23: qty 3

## 2023-09-23 MED ORDER — DM-GUAIFENESIN ER 30-600 MG PO TB12
1.0000 | ORAL_TABLET | Freq: Two times a day (BID) | ORAL | Status: DC
  Administered 2023-09-23 – 2023-09-25 (×5): 1 via ORAL
  Filled 2023-09-23 (×6): qty 1

## 2023-09-23 MED ORDER — SODIUM CHLORIDE 0.9 % IV SOLN
1.0000 g | Freq: Once | INTRAVENOUS | Status: AC
  Administered 2023-09-23 (×2): 1 g via INTRAVENOUS
  Filled 2023-09-23: qty 10

## 2023-09-23 MED ORDER — SODIUM CHLORIDE 0.9% FLUSH
3.0000 mL | INTRAVENOUS | Status: DC | PRN
Start: 2023-09-23 — End: 2023-09-25

## 2023-09-23 MED ORDER — QUETIAPINE FUMARATE 25 MG PO TABS
12.5000 mg | ORAL_TABLET | Freq: Two times a day (BID) | ORAL | Status: DC
  Administered 2023-09-24 – 2023-09-25 (×3): 12.5 mg via ORAL
  Filled 2023-09-23 (×4): qty 1

## 2023-09-23 MED ORDER — CLOPIDOGREL BISULFATE 75 MG PO TABS
75.0000 mg | ORAL_TABLET | Freq: Every day | ORAL | Status: DC
  Administered 2023-09-23 – 2023-09-25 (×4): 75 mg via ORAL
  Filled 2023-09-23 (×4): qty 1

## 2023-09-23 MED ORDER — METHYLPREDNISOLONE SODIUM SUCC 125 MG IJ SOLR
125.0000 mg | Freq: Once | INTRAMUSCULAR | Status: AC
  Administered 2023-09-23 (×2): 125 mg via INTRAVENOUS
  Filled 2023-09-23: qty 2

## 2023-09-23 MED ORDER — METHYLPREDNISOLONE SODIUM SUCC 40 MG IJ SOLR
40.0000 mg | Freq: Two times a day (BID) | INTRAMUSCULAR | Status: DC
  Administered 2023-09-24 – 2023-09-25 (×3): 40 mg via INTRAVENOUS
  Filled 2023-09-23 (×3): qty 1

## 2023-09-23 MED ORDER — ARFORMOTEROL TARTRATE 15 MCG/2ML IN NEBU
15.0000 ug | INHALATION_SOLUTION | Freq: Two times a day (BID) | RESPIRATORY_TRACT | Status: DC
  Administered 2023-09-23 – 2023-09-24 (×3): 15 ug via RESPIRATORY_TRACT
  Filled 2023-09-23 (×4): qty 2

## 2023-09-23 MED ORDER — HALOPERIDOL LACTATE 5 MG/ML IJ SOLN
2.0000 mg | Freq: Four times a day (QID) | INTRAMUSCULAR | Status: DC | PRN
  Administered 2023-09-24: 2 mg via INTRAMUSCULAR
  Filled 2023-09-23: qty 1

## 2023-09-23 NOTE — Progress Notes (Addendum)
 Patient is confused, agitated, impulsive, and verbally aggressive, making gestures suggestive of physical aggression. The patient is non-compliant with prescribed treatment and safety measures. Therapeutic approaches were attempted by the RN and family; however, the patient refused to take his medication. The patient also refused IV access and had previously removed a two PIV lines earlier. The patient's wife was present at the bedside at time of medication refusal and medical care (wife stated: if he says no to medical care or medications, his refusal needs to be respected). For patient safety, a 1:1 sitter was initiated to promote safety and reduce the risk of injury. The RN will continue to utilize therapeutic communication and interventions to encourage cooperation and support the patient's care. Dr. Charlton on call was notified of the situation and medication refusal

## 2023-09-23 NOTE — ED Provider Notes (Signed)
 [place] EMERGENCY DEPARTMENT AT [facility] Provider Note  CSN: [id number] Arrival date & time: 09/23/23 1346  Chief Complaint(s) Respiratory Distress  HPI Ronald [name] is a 83 y.o. male history of CHF, end-stage renal disease, prior stroke, presenting with shortness of breath.  Patient reports shortness of breath, does not know how long he has had this for.  Per paramedics, he was at dialysis and seemed to get progressively short of breath and required oxygen.  Patient's wife reports that he has had more increased shortness of breath over the past few days some wheezing, cough at home.  Not sure if it is productive.  Patient denies any chest pain, abdominal pain.  Patient is not sure if he went to dialysis earlier this week.  Patient is not sure if he has been having any fevers or chills.  Patient is not sure if he has been having any other new symptoms.  He does smoke daily.  Does not use oxygen at home.  History slight limited due to dementia   Past Medical History Past Medical History:  Diagnosis Date   CHF (congestive heart failure) (HCC)    ESRD on hemodialysis (HCC)    GERD (gastroesophageal reflux disease)    Glaucoma    Hypertension    Macular degeneration    Myocardial infarction [facility])    1999   Prostate cancer (HCC) 02/2018   Stroke (HCC)    no deficits; had stroke while trying to place stents in legs.   Patient Active Problem List   Diagnosis Date Noted   Acute respiratory failure with hypoxia (HCC) 09/23/2023   Hardening of the aorta (main artery of the heart) (HCC) 08/13/2023   Kidney lesion 08/13/2023   Senile dementia (HCC) 08/13/2023   TIA (transient ischemic attack) 08/07/2023   Dyspnea and respiratory abnormalities 07/08/2023   Advance care planning 06/29/2023   DNR (do not resuscitate) 06/29/2023   Hyperkalemia 06/29/2023   Ischemic colitis (HCC) 06/29/2023   Upper GI bleed 06/28/2023   Status post AAA (abdominal aortic aneurysm)  repair 06/22/2023   Acute anemia 06/22/2023   Nonrheumatic aortic valve stenosis 06/22/2023   Atrial fibrillation with controlled ventricular response (HCC) 06/20/2023   Persistent atrial fibrillation (HCC) 06/18/2023   AAA (abdominal aortic aneurysm) (HCC) 06/17/2023   Moderate aortic valve stenosis 04/29/2023   Edema of lower extremity 12/18/2022   Tobacco dependence due to cigarettes 11/27/2022   Acute non-ST segment elevation myocardial infarction (HCC) 11/27/2022   Chronic obstructive pulmonary disease (HCC) 11/27/2022   Acute congestive heart failure (HCC) 11/27/2022   Paroxysmal A-fib (HCC) 11/12/2022   NSTEMI (non-ST elevated myocardial infarction) (HCC) 11/12/2022   History of open heart surgery 11/12/2022   Chronic congestive heart failure (HCC) 11/12/2022   Tobacco abuse 11/12/2022   Skin lesion 04/09/2022   Age-related macular degeneration 04/09/2022   Anemia 04/09/2022   Benign prostatic hyperplasia 04/09/2022   Glaucoma 04/09/2022   Minimal cognitive impairment 07/05/2021   End-stage renal disease on hemodialysis (HCC) 03/14/2021   Secondary hyperparathyroidism (HCC) 03/14/2021   Abdominal aortic aneurysm (HCC) 03/14/2021   Atherosclerosis of coronary artery without angina pectoris 03/14/2021   Chronic insomnia 03/14/2021   Chronic low back pain 03/14/2021   Diarrhea 03/14/2021   Gastroesophageal reflux disease without esophagitis 03/14/2021   Mixed hyperlipidemia 03/14/2021   Gross hematuria 04/13/2020   Acute prostatitis 04/13/2020   Hypertension 05/04/2019   Chronic kidney disease, stage IV (severe) (HCC) 05/04/2019   Prostate cancer (HCC) 11/10/2017  Home Medication(s) Prior to Admission medications   Medication Sig Start Date End Date Taking? Authorizing Provider  acetaminophen  (TYLENOL ) 500 MG tablet Take 1,000 mg by mouth every 8 (eight) hours as needed for mild pain (pain score 1-3).   Yes [provider]  BREZTRI AEROSPHERE 160-9-4.8 MCG/ACT  AERO inhaler Inhale 2 puffs into the lungs 2 (two) times daily. 08/13/23  Yes [provider]  LORazepam  (ATIVAN ) 1 MG tablet Take 1 mg by mouth at bedtime as needed for sleep. 07/30/23  Yes [provider]  pantoprazole  (PROTONIX ) 40 MG tablet Take 1 tablet (40 mg total) by mouth 2 (two) times daily for 60 days, THEN 1 tablet (40 mg total) daily. 07/02/23 09/30/23 Yes Gonfa, Taye T, MD  QUEtiapine  (SEROQUEL ) 25 MG tablet Take 25 mg by mouth daily. 08/13/23  Yes [provider]  amiodarone  (PACERONE ) 200 MG tablet Take 1 tablet (200 mg total) by mouth daily. 07/03/23   Gonfa, Taye T, MD  atorvastatin  (LIPITOR) 80 MG tablet Take 80 mg by mouth in the morning. 07/05/19   [provider]  benzonatate  (TESSALON ) 100 MG capsule Take 1 capsule (100 mg total) by mouth 3 (three) times daily as needed for cough. 07/11/23   Maree, Pratik D, DO  brimonidine  (ALPHAGAN ) 0.2 % ophthalmic solution INSTILL 1 DROP INTO RIGHT EYE TWICE A DAY 08/18/23   Valdemar Rogue, MD  calcium  acetate (PHOSLO ) 667 MG capsule Take 1,334 mg by mouth 3 (three) times daily. citracal    [provider]  Calcium  Carbonate Antacid (TUMS PO) Take 500 mg by mouth daily as needed (heartburn, indigestion). Patient not taking: Reported on 09/23/2023 09/11/20   [provider]  clopidogrel  (PLAVIX ) 75 MG tablet Take 75 mg by mouth daily.    [provider]  dorzolamide -timolol  (COSOPT ) 2-0.5 % ophthalmic solution INSTILL 1 DROP INTO RIGHT EYE TWICE A DAY 08/18/23   Valdemar Rogue, MD  Magnesium  Citrate 100 MG CAPS See Instructions, 0 Refill(s) 10/08/20   [provider]  metoprolol  succinate (TOPROL -XL) 50 MG 24 hr tablet Take 50 mg by mouth daily. TAKE WITH OR IMMEDIATELY FOLLOWING A MEAL.    [provider]  midodrine (PROAMATINE) 10 MG tablet Take 10 mg by mouth 3 (three) times a week.    [provider]  mirtazapine  (REMERON ) 15 MG tablet Take 15 mg by mouth at bedtime.  05/02/23   [provider]  polyethylene glycol powder (GLYCOLAX /MIRALAX ) 17 GM/SCOOP powder Take 17 g by mouth 2 (two) times daily as needed for mild constipation. 07/03/23   Rosario Leatrice FERNS, MD  tamsulosin  (FLOMAX ) 0.4 MG CAPS capsule Take 1 capsule (0.4 mg total) by mouth daily after supper. 12/24/22   McKenzie, Belvie CROME, MD  torsemide  (DEMADEX ) 20 MG tablet Take 60 mg by mouth daily.    [provider]  traMADol  (ULTRAM ) 50 MG tablet Take 50 mg by mouth every 8 (eight) hours as needed for moderate pain (pain score 4-6). Patient not taking: Reported on 09/23/2023 09/11/20   [provider]  Past Surgical History Past Surgical History:  Procedure Laterality Date   ABDOMINAL AORTIC ANEURYSM REPAIR  2012   IN [place]    ABDOMINAL AORTIC ENDOVASCULAR STENT GRAFT N/A 06/17/2023   Procedure: INSERTION, ENDOVASCULAR STENT GRAFT, AORTA, ABDOMINAL;  Surgeon: Gretta Lonni PARAS, MD;  Location: [facility] OR;  Service: Vascular;  Laterality: N/A;   APPENDECTOMY     AV FISTULA PLACEMENT Left 10/16/2020   Procedure: LEFT ARM ARTERIOVENOUS (AV) FISTULA CREATION;  Surgeon: Oris Krystal FALCON, MD;  Location: [facility] ORS;  Service: Vascular;  Laterality: Left;   CORONARY ARTERY BYPASS GRAFT  01/1998   3 vessels 18 years ago   ENDARTERECTOMY FEMORAL Right 06/17/2023   Procedure: ENDARTERECTOMY, COMMON FEMORAL AND SUPERFICIAL FEMORAL;  Surgeon: Gretta Lonni PARAS, MD;  Location: [facility] OR;  Service: Vascular;  Laterality: Right;   INGUINAL HERNIA REPAIR Right 09/20/2015   Procedure: REPAIR RIGHT INGUINAL HERNIA  WITH MESH;  Surgeon: Selinda Artist Moats, MD;  Location: [facility]S;  Service: General;  Laterality: Right;   INSERTION OF DIALYSIS CATHETER Right 08/02/2020   Procedure: INSERTION OF RIGHT TUNNELED INTERNAL JUGULAR  DIALYSIS CATHETER;  Surgeon: Oris Krystal FALCON, MD;  Location:  [facility];  Service: Vascular;  Laterality: Right;   INSERTION OF ILIAC STENT Right 06/17/2023   Procedure: INSERTION, STENT, ARTERY, ILIAC;  Surgeon: Gretta Lonni PARAS, MD;  Location: [facility] OR;  Service: Vascular;  Laterality: Right;   LOWER EXTREMITY ANGIOGRAM Right 06/17/2023   Procedure: GERALYN, LOWER EXTREMITY;  Surgeon: Gretta Lonni PARAS, MD;  Location[facility];  Service: Vascular;  Laterality: Right;   PATCH ANGIOPLASTY Right 06/17/2023   Procedure: ANGIOPLASTY, USING PATCH GRAFT USING GEORGE LINTS;  Surgeon: Gretta Lonni PARAS, MD;  Location: [facility] OR;  Service: Vascular;  Laterality: Right;   REMOVAL OF A DIALYSIS CATHETER N/A 03/19/2021   Procedure: MINOR REMOVAL OF A TUNNELED DIALYSIS CATHETER;  Surgeon: Oris Krystal FALCON, MD;  Location: [facility];  Service: Vascular;  Laterality: N/A;   tumor removal from bladder     ULTRASOUND GUIDANCE FOR VASCULAR ACCESS Bilateral 06/17/2023   Procedure: ULTRASOUND GUIDANCE, FOR VASCULAR ACCESS;  Surgeon: Gretta Lonni PARAS, MD;  Location: [facility] OR;  Service: Vascular;  Laterality: Bilateral;   Family History Family History  Problem Relation Age of Onset   Cancer Father     Social History Social History   Tobacco Use   Smoking status: Some Days    Current packs/day: 0.50    Average packs/day: 0.5 packs/day for 66.1 years (33.1 ttl pk-yrs)    Types: Cigarettes    Start date: 08/07/1957   Smokeless tobacco: Never   Tobacco comments:    08/05/23 wife reports he gets up during the night & she notes smoking signs when she goes to the kitchen    on chantix now  Vaping Use   Vaping status: Never Used  Substance Use Topics   Alcohol use: No   Drug use: No   Allergies Patient has no known allergies.  Review of Systems Review of Systems  All other systems reviewed and are negative.   Physical Exam Vital Signs  I have reviewed the triage vital signs BP 109/76   Pulse 93   Temp 97.6 F (36.4 C) (Rectal)   Resp 18   SpO2 99%  Physical  Exam Vitals and nursing note reviewed.  Constitutional:      General: He is in acute distress.     Appearance: Normal appearance.  HENT:     Mouth/Throat:  Mouth: Mucous membranes are moist.  Eyes:     Conjunctiva/sclera: Conjunctivae normal.  Cardiovascular:     Rate and Rhythm: Normal rate and regular rhythm.  Pulmonary:     Comments: Tachypnea with increased work of breathing, prolonged expiration, diffuse wheeze.  Possible trace bibasilar crackles Abdominal:     General: Abdomen is flat.     Palpations: Abdomen is soft.     Tenderness: There is no abdominal tenderness.  Musculoskeletal:     Right lower leg: Edema present.     Left lower leg: Edema present.  Skin:    General: Skin is warm and dry.     Capillary Refill: Capillary refill takes less than 2 seconds.  Neurological:     Mental Status: He is alert. Mental status is at baseline.     Comments: Oriented to self, knows he is at hospital and is here because he is short of breath.  Does not know the year  Psychiatric:        Mood and Affect: Mood normal.        Behavior: Behavior normal.     ED Results and Treatments Labs (all labs ordered are listed, but only abnormal results are displayed) Labs Reviewed  COMPREHENSIVE METABOLIC PANEL WITH GFR - Abnormal; Notable for the following components:      Result Value   Sodium 134 (*)    Chloride 93 (*)    BUN 44 (*)    Creatinine, Ser 7.59 (*)    Calcium  7.7 (*)    Total Protein 6.0 (*)    Albumin  2.9 (*)    GFR, Estimated 7 (*)    Anion gap 18 (*)    All other components within normal limits  CBC WITH DIFFERENTIAL/PLATELET - Abnormal; Notable for the following components:   WBC 3.3 (*)    RBC 2.98 (*)    Hemoglobin 9.4 (*)    HCT 30.0 (*)    MCV 100.7 (*)    RDW 17.2 (*)    Platelets 138 (*)    All other components within normal limits  BLOOD GAS, VENOUS - Abnormal; Notable for the following components:   pO2, Ven <31 (*)    Bicarbonate 28.1 (*)     All other components within normal limits  CULTURE, BLOOD (ROUTINE X 2)  CULTURE, BLOOD (ROUTINE X 2)  LACTIC ACID, PLASMA  PROTIME-INR  LACTIC ACID, PLASMA  BRAIN NATRIURETIC PEPTIDE                                                                                                                          Radiology No results found.  Pertinent labs & imaging results that were available during my care of the patient were reviewed by me and considered in my medical decision making (see MDM for details).  Medications Ordered in ED Medications  cefTRIAXone  (ROCEPHIN ) 1 g in sodium chloride  0.9 % 100 mL IVPB (has no administration in time range)  methylPREDNISolone  sodium  succinate (SOLU-MEDROL ) 40 mg/mL injection 40 mg (has no administration in time range)  budesonide  (PULMICORT ) nebulizer solution 0.5 mg (has no administration in time range)  arformoterol  (BROVANA ) nebulizer solution 15 mcg (has no administration in time range)  doxycycline  (VIBRA -TABS) tablet 100 mg (has no administration in time range)  nicotine  (NICODERM CQ  - dosed in mg/24 hours) patch 14 mg (has no administration in time range)  dextromethorphan -guaiFENesin  (MUCINEX  DM) 30-600 MG per 12 hr tablet 1 tablet (has no administration in time range)  pantoprazole  (PROTONIX ) EC tablet 40 mg (has no administration in time range)  ipratropium-albuterol  (DUONEB) 0.5-2.5 (3) MG/3ML nebulizer solution 3 mL (3 mLs Nebulization Given 09/23/23 1431)  methylPREDNISolone  sodium succinate (SOLU-MEDROL ) 125 mg/2 mL injection 125 mg (125 mg Intravenous Given 09/23/23 1431)  furosemide  (LASIX ) injection 80 mg (80 mg Intravenous Given 09/23/23 1428)                                                                                                                                     Procedures .Critical Care  Performed by: Francesca Elsie CROME, MD Authorized by: Francesca Elsie CROME, MD   Critical care provider statement:    Critical care time  (minutes):  30   Critical care was necessary to treat or prevent imminent or life-threatening deterioration of the following conditions:  Respiratory failure   Critical care was time spent personally by me on the following activities:  Development of treatment plan with patient or surrogate, discussions with consultants, evaluation of patient's response to treatment, examination of patient, ordering and review of laboratory studies, ordering and review of radiographic studies, ordering and performing treatments and interventions, pulse oximetry, re-evaluation of patient's condition and review of old charts   Care discussed with: admitting provider     (including critical care time)  Medical Decision Making / ED Course   MDM:  83 year old presenting to the emergency department shortness of breath.  Patient in mild respiratory distress, exam most consistent with bronchospasm, COPD exacerbation.  Will obtain chest x-ray to evaluate for other process such as underlying pneumonia.  Does have lower extremity swelling and basilar crackles so may have component of CHF/volume overload as well.  No reported fevers to suggest pneumonia.  Seems unlikely to be pneumothorax.  Will give dose of Lasix  as well as COPD treatment.  Will reassess.  Given new oxygen requirement likely needs admission.  Clinical Course as of 09/23/23 1534  Wed Sep 23, 2023  1530 Discussed with Dr. Norine, nephrology will consult for HD needs. Discussed with Dr. Ricky who will admit for resp failure.  [WS]    Clinical Course User Index [WS] Francesca Elsie CROME, MD     Additional history obtained: -Additional history obtained from ems and spouse -External records from outside source obtained and reviewed including: Chart review including previous notes, labs, imaging, consultation notes including prior notes    Lab Tests: -I ordered, reviewed,  and interpreted labs.   The pertinent results include:   Labs Reviewed   COMPREHENSIVE METABOLIC PANEL WITH GFR - Abnormal; Notable for the following components:      Result Value   Sodium 134 (*)    Chloride 93 (*)    BUN 44 (*)    Creatinine, Ser 7.59 (*)    Calcium  7.7 (*)    Total Protein 6.0 (*)    Albumin  2.9 (*)    GFR, Estimated 7 (*)    Anion gap 18 (*)    All other components within normal limits  CBC WITH DIFFERENTIAL/PLATELET - Abnormal; Notable for the following components:   WBC 3.3 (*)    RBC 2.98 (*)    Hemoglobin 9.4 (*)    HCT 30.0 (*)    MCV 100.7 (*)    RDW 17.2 (*)    Platelets 138 (*)    All other components within normal limits  BLOOD GAS, VENOUS - Abnormal; Notable for the following components:   pO2, Ven <31 (*)    Bicarbonate 28.1 (*)    All other components within normal limits  CULTURE, BLOOD (ROUTINE X 2)  CULTURE, BLOOD (ROUTINE X 2)  LACTIC ACID, PLASMA  PROTIME-INR  LACTIC ACID, PLASMA  BRAIN NATRIURETIC PEPTIDE    Notable for no leukocytosis, chronic renal failure, no hypercapnea   EKG   EKG Interpretation Date/Time:  Wednesday September 23 2023 14:02:25 EDT Ventricular Rate:  91 PR Interval:    QRS Duration:  151 QT Interval:  444 QTC Calculation: 547 R Axis:   -56  Text Interpretation: Atrial fibrillation Left bundle branch block Confirmed by Francesca Fallow ([id number]) on 09/23/2023 2:38:02 PM         Imaging Studies ordered: I ordered imaging studies including CXR On my interpretation imaging demonstrates pulm edema, no focal infiltrate  I independently visualized and interpreted imaging. I agree with the radiologist interpretation   Medicines ordered and prescription drug management: Meds ordered this encounter  Medications   ipratropium-albuterol  (DUONEB) 0.5-2.5 (3) MG/3ML nebulizer solution 3 mL   methylPREDNISolone  sodium succinate (SOLU-MEDROL ) 125 mg/2 mL injection 125 mg   furosemide  (LASIX ) injection 80 mg   cefTRIAXone  (ROCEPHIN ) 1 g in sodium chloride  0.9 % 100 mL IVPB     Antibiotic Indication::   CAP   methylPREDNISolone  sodium succinate (SOLU-MEDROL ) 40 mg/mL injection 40 mg    IV methylprednisolone  will be converted to either a q12h or q24h frequency with the same total daily dose (TDD).  Ordered Dose: 1 to 125 mg TDD; convert to: TDD q24h.  Ordered Dose: 126 to 250 mg TDD; convert to: TDD div q12h.  Ordered Dose: >250 mg TDD; DAW.   budesonide  (PULMICORT ) nebulizer solution 0.5 mg   arformoterol  (BROVANA ) nebulizer solution 15 mcg   doxycycline  (VIBRA -TABS) tablet 100 mg   nicotine  (NICODERM CQ  - dosed in mg/24 hours) patch 14 mg   dextromethorphan -guaiFENesin  (MUCINEX  DM) 30-600 MG per 12 hr tablet 1 tablet   pantoprazole  (PROTONIX ) EC tablet 40 mg    -I have reviewed the patients home medicines and have made adjustments as needed   Consultations Obtained: I requested consultation with the nephrologist,  and discussed lab and imaging findings as well as pertinent plan - they recommend: admission   Cardiac Monitoring: The patient was maintained on a cardiac monitor.  I personally viewed and interpreted the cardiac monitored which showed an underlying rhythm of: afib  Social Determinants of Health:  Diagnosis or treatment significantly limited by  social determinants of health: current smoker and obesity   Reevaluation: After the interventions noted above, I reevaluated the patient and found that their symptoms have improved  Co morbidities that complicate the patient evaluation  Past Medical History:  Diagnosis Date   CHF (congestive heart failure) (HCC)    ESRD on hemodialysis (HCC)    GERD (gastroesophageal reflux disease)    Glaucoma    Hypertension    Macular degeneration    Myocardial infarction [facility])    1999   Prostate cancer (HCC) 02/2018   Stroke (HCC)    no deficits; had stroke while trying to place stents in legs.      Dispostion: Disposition decision including need for hospitalization was considered, and patient  admitted to the hospital.    Final Clinical Impression(s) / ED Diagnoses Final diagnoses:  Acute hypoxic respiratory failure (HCC)  COPD exacerbation (HCC)  Hypervolemia associated with renal insufficiency     This chart was dictated using voice recognition software.  Despite best efforts to proofread,  errors can occur which can change the documentation meaning.    Francesca Elsie CROME, MD 09/23/23 1534

## 2023-09-23 NOTE — Group Note (Deleted)
 Date:  09/23/2023 Time:  2:22 PM  Group Topic/Focus:  Wellness Toolbox:   The focus of this group is to discuss various aspects of wellness, balancing those aspects and exploring ways to increase the ability to experience wellness.  Patients will create a wellness toolbox for use upon discharge.     Participation Level:  {BHH PARTICIPATION OZCZO:77735}  Participation Quality:  {BHH PARTICIPATION QUALITY:22265}  Affect:  {BHH AFFECT:22266}  Cognitive:  {BHH COGNITIVE:22267}  Insight: {BHH Insight2:20797}  Engagement in Group:  {BHH ENGAGEMENT IN HMNLE:77731}  Modes of Intervention:  {BHH MODES OF INTERVENTION:22269}  Additional Comments:  ***  Ronald [name] 09/23/2023, 2:22 PM

## 2023-09-23 NOTE — Progress Notes (Signed)
 Patient ripped IV out. Another IV was placed and patient ripped that IV out as well. Patient refusing to let us  put IV in at this time.

## 2023-09-23 NOTE — H&P (Signed)
 History and Physical    Patient: Ronald [name] FMW:[id number] DOB: 1940/05/21 DOA: 09/23/2023 DOS: the patient was seen and examined on 09/23/2023 PCP: Shona Norleen PEDLAR, MD   Patient coming from: Shortness of breath with hypoxia  Chief Complaint:  Chief Complaint  Patient presents with   Respiratory Distress   HPI: Ronald [name] is a 83 y.o. male with medical history significant of hypertension, hyperlipidemia, prostate cancer, ESRD, diastolic heart failure, dementia, GERD and tobacco abuse; who presented from dialysis center secondary to worsening shortness of breath and hypoxia.  Patient's wife reporting URI symptoms for the last 4 days or so that have worsened up on the day of admission.  A dialysis patient had 1.5 hours left for treatment.  Folded in a second supplementation in place; mildly productive intermittent coughing spells reported.  No fever, no nausea, no vomiting, no chest pain, no dysuria, no hematuria, no focal weaknesses or any other complaints. Workup in the ED demonstrating no frank infiltrates, positive vascular congestion and diffuse wheezing on examination with bibasilar crackles.  Nebulizer treatment, steroids, Lasix  and IV antibiotics given at time of admission; cultures taken.  TRH contacted to place patient in the hospital for further evaluation and management.  Review of Systems: As mentioned in the history of present illness. All other systems reviewed and are negative.  Past Medical History:  Diagnosis Date   CHF (congestive heart failure) (HCC)    ESRD on hemodialysis (HCC)    GERD (gastroesophageal reflux disease)    Glaucoma    Hypertension    Macular degeneration    Myocardial infarction [facility])    1999   Prostate cancer (HCC) 02/2018   Stroke (HCC)    no deficits; had stroke while trying to place stents in legs.   Past Surgical History:  Procedure Laterality Date   ABDOMINAL AORTIC ANEURYSM REPAIR  2012   IN [place]    ABDOMINAL  AORTIC ENDOVASCULAR STENT GRAFT N/A 06/17/2023   Procedure: INSERTION, ENDOVASCULAR STENT GRAFT, AORTA, ABDOMINAL;  Surgeon: Gretta Lonni PARAS, MD;  Location: [facility] OR;  Service: Vascular;  Laterality: N/A;   APPENDECTOMY     AV FISTULA PLACEMENT Left 10/16/2020   Procedure: LEFT ARM ARTERIOVENOUS (AV) FISTULA CREATION;  Surgeon: Oris Krystal FALCON, MD;  Location: [facility] ORS;  Service: Vascular;  Laterality: Left;   CORONARY ARTERY BYPASS GRAFT  01/1998   3 vessels 18 years ago   ENDARTERECTOMY FEMORAL Right 06/17/2023   Procedure: ENDARTERECTOMY, COMMON FEMORAL AND SUPERFICIAL FEMORAL;  Surgeon: Gretta Lonni PARAS, MD;  Location: [facility] OR;  Service: Vascular;  Laterality: Right;   INGUINAL HERNIA REPAIR Right 09/20/2015   Procedure: REPAIR RIGHT INGUINAL HERNIA  WITH MESH;  Surgeon: Selinda Artist Moats, MD;  Location: [facility] ORS;  Service: General;  Laterality: Right;   INSERTION OF DIALYSIS CATHETER Right 08/02/2020   Procedure: INSERTION OF RIGHT TUNNELED INTERNAL JUGULAR  DIALYSIS CATHETER;  Surgeon: Oris Krystal FALCON, MD;  Location: [facility];  Service: Vascular;  Laterality: Right;   INSERTION OF ILIAC STENT Right 06/17/2023   Procedure: INSERTION, STENT, ARTERY, ILIAC;  Surgeon: Gretta Lonni PARAS, MD;  Location: [facility] OR;  Service: Vascular;  Laterality: Right;   LOWER EXTREMITY ANGIOGRAM Right 06/17/2023   Procedure: GERALYN, LOWER EXTREMITY;  Surgeon: Gretta Lonni PARAS, MD;  [facility] OR;  Service: Vascular;  Laterality: Right;   PATCH ANGIOPLASTY Right 06/17/2023   Procedure: ANGIOPLASTY, USING PATCH GRAFT USING GEORGE LINTS;  Surgeon: Gretta Lonni PARAS, MD;  Location: [facility] OR;  Service: Vascular;  Laterality: Right;   REMOVAL OF A DIALYSIS CATHETER N/A 03/19/2021   Procedure: MINOR REMOVAL OF A TUNNELED DIALYSIS CATHETER;  Surgeon: Oris Krystal FALCON, MD;  Location: [facility];  Service: Vascular;  Laterality: N/A;   tumor removal from bladder     ULTRASOUND GUIDANCE FOR VASCULAR ACCESS Bilateral 06/17/2023   Procedure:  ULTRASOUND GUIDANCE, FOR VASCULAR ACCESS;  Surgeon: Gretta Lonni PARAS, MD;  Location: [facility] OR;  Service: Vascular;  Laterality: Bilateral;   Social History:  reports that he has been smoking cigarettes. He started smoking about 66 years ago. He has a 33.1 pack-year smoking history. He has never used smokeless tobacco. He reports that he does not drink alcohol and does not use drugs.  No Known Allergies  Family History  Problem Relation Age of Onset   Cancer Father     Prior to Admission medications   Medication Sig Start Date End Date Taking? Authorizing Provider  acetaminophen  (TYLENOL ) 500 MG tablet Take 1,000 mg by mouth every 8 (eight) hours as needed for mild pain (pain score 1-3).   Yes [provider]  amiodarone  (PACERONE ) 200 MG tablet Take 1 tablet (200 mg total) by mouth daily. 07/03/23  Yes Gonfa, Taye T, MD  atorvastatin  (LIPITOR) 80 MG tablet Take 80 mg by mouth in the morning. 07/05/19  Yes [provider]  BREZTRI AEROSPHERE 160-9-4.8 MCG/ACT AERO inhaler Inhale 2 puffs into the lungs 2 (two) times daily. 08/13/23  Yes [provider]  clopidogrel  (PLAVIX ) 75 MG tablet Take 75 mg by mouth daily.   Yes [provider]  LORazepam  (ATIVAN ) 1 MG tablet Take 1 mg by mouth at bedtime as needed for sleep. 07/30/23  Yes [provider]  metoprolol  succinate (TOPROL -XL) 50 MG 24 hr tablet Take 50 mg by mouth daily. TAKE WITH OR IMMEDIATELY FOLLOWING A MEAL.   Yes [provider]  midodrine (PROAMATINE) 10 MG tablet Take 10 mg by mouth every dialysis (low blood pressure).   Yes [provider]  pantoprazole  (PROTONIX ) 40 MG tablet Take 1 tablet (40 mg total) by mouth 2 (two) times daily for 60 days, THEN 1 tablet (40 mg total) daily. Patient taking differently: Take 1 tablet (40 mg) by mouth daily. 07/02/23 09/30/23 Yes Kathrin Mignon DASEN, MD  tamsulosin  (FLOMAX ) 0.4 MG CAPS capsule Take 1 capsule (0.4 mg total) by mouth daily after  supper. 12/24/22  Yes McKenzieBelvie CROME, MD    Physical Exam: Vitals:   09/23/23 1415 09/23/23 1430 09/23/23 1449 09/23/23 1536  BP: 118/83 109/76    Pulse:      Resp: (!) 25 18    Temp:   97.6 F (36.4 C) (!) 97.5 F (36.4 C)  TempSrc:   Rectal Oral  SpO2:       General exam: Alert, awake, oriented x 1 intermittently; patient with underlying cognitive impairment from dementia. Respiratory system: 4 L nasal cannula in place; diffuse bilateral rhonchi and expiratory wheezing appreciated on exam.  Tachypnea appreciated at time of admission. Cardiovascular system:RRR. No rubs or gallops; no JVD. Gastrointestinal system: Abdomen is nondistended, soft and nontender. No organomegaly or masses felt. Normal bowel sounds heard. Central nervous system: Moving 4 limbs spontaneously.  No focal neurological deficits. Extremities: No cyanosis or clubbing.  3+ edema appreciated bilaterally. Skin: No petechiae. Psychiatry: Judgement and insight appear impaired secondary to dementia.  Data Reviewed: CBC: WBCs 3.4, hemoglobin 9.1 and platelet count 132K Lactic acid: 1.5 BNP: 4030 CMP: Sodium 134,  potassium 4.8, chloride 93, bicarb 23, BUN 44, creatinine 7.59, normal LFTs and GFR 7.  Anion gap 18  Assessment and Plan: 1-acute respiratory failure with hypoxia - Appears to be secondary to COPD exacerbation - Nebulizer management, steroids, Mucinex , flutter valve and oxygen supplementation to be provided - Follow clinical response - Doxycycline  for underlying bronchitis/bronchiectasis will be provided.  2-ESRD - Patient with partial treatment on 09/23/2023 prior to admission - Nephrology service has been consulted and will follow recommendations to continue hemodialysis therapy  3-advanced dementia with behavioral disturbances - Continue supportive care and concern reorientation - Seroquel  twice a day has been added to patient's regimen - As needed Haldol  hide also prescribed - Palliative  care recommended.  4-tobacco abuse - Nicotine  patch will be provided while inpatient.  5-GERD - Continue PPI.  6-prior history of stroke -- Continue secondary prevention and the use of Plavix .    Advance Care Planning:   Code Status: Limited: Do not attempt resuscitation (DNR) -DNR-LIMITED -Do Not Intubate/DNI    Consults: Palliative care and nephrology service.  Family Communication: Wife at bedside.  Severity of Illness: The appropriate patient status for this patient is INPATIENT. Inpatient status is judged to be reasonable and necessary in order to provide the required intensity of service to ensure the patient's safety. The patient's presenting symptoms, physical exam findings, and initial radiographic and laboratory data in the context of their chronic comorbidities is felt to place them at high risk for further clinical deterioration. Furthermore, it is not anticipated that the patient will be medically stable for discharge from the hospital within 2 midnights of admission.   * I certify that at the point of admission it is my clinical judgment that the patient will require inpatient hospital care spanning beyond 2 midnights from the point of admission due to high intensity of service, high risk for further deterioration and high frequency of surveillance required.*  Author: Eric Nunnery, MD 09/23/2023 3:45 PM  For on call review [email or web address].

## 2023-09-23 NOTE — ED Notes (Signed)
 Lab tech stated pt needed a new BNP drawn and he put the order in for lab to draw.

## 2023-09-23 NOTE — ED Triage Notes (Signed)
 Pt bib ems from [facility] with difficulty breathing, hypoxia. Woke up weak this morning, went to dialysis. During dialysis pt developed trouble breathing with saturations of 70's. Adventitious breath sounds with dialysis. Placed on 4L [place] with increase to the 90's. Had approx 1.5 hours left of treatment. Pt with hx dementia. Fingertips appear cyanotic pt with labored breathing upon arrival. Coarse crackles in all fields.

## 2023-09-23 NOTE — Plan of Care (Signed)
   Problem: Coping: Goal: Level of anxiety will decrease Outcome: Progressing   Problem: Elimination: Goal: Will not experience complications related to bowel motility Outcome: Progressing Goal: Will not experience complications related to urinary retention Outcome: Progressing   Problem: Safety: Goal: Ability to remain free from injury will improve Outcome: Progressing   Problem: Skin Integrity: Goal: Risk for impaired skin integrity will decrease Outcome: Progressing

## 2023-09-24 DIAGNOSIS — Z7189 Other specified counseling: Secondary | ICD-10-CM

## 2023-09-24 DIAGNOSIS — J9601 Acute respiratory failure with hypoxia: Secondary | ICD-10-CM | POA: Diagnosis not present

## 2023-09-24 DIAGNOSIS — Z515 Encounter for palliative care: Secondary | ICD-10-CM

## 2023-09-24 DIAGNOSIS — Z558 Other problems related to education and literacy: Secondary | ICD-10-CM

## 2023-09-24 LAB — RENAL FUNCTION PANEL
Albumin: 2.9 g/dL — ABNORMAL LOW (ref 3.5–5.0)
Anion gap: 20 — ABNORMAL HIGH (ref 5–15)
BUN: 62 mg/dL — ABNORMAL HIGH (ref 8–23)
CO2: 17 mmol/L — ABNORMAL LOW (ref 22–32)
Calcium: 7.9 mg/dL — ABNORMAL LOW (ref 8.9–10.3)
Chloride: 96 mmol/L — ABNORMAL LOW (ref 98–111)
Creatinine, Ser: 9.94 mg/dL — ABNORMAL HIGH (ref 0.61–1.24)
GFR, Estimated: 5 mL/min — ABNORMAL LOW (ref >60)
Glucose, Bld: 139 mg/dL — ABNORMAL HIGH (ref 70–99)
Phosphorus: 8.7 mg/dL — ABNORMAL HIGH (ref 2.5–4.6)
Potassium: 5.2 mmol/L — ABNORMAL HIGH (ref 3.5–5.1)
Sodium: 133 mmol/L — ABNORMAL LOW (ref 135–145)

## 2023-09-24 LAB — BASIC METABOLIC PANEL WITH GFR
Anion gap: 18 — ABNORMAL HIGH (ref 5–15)
BUN: 56 mg/dL — ABNORMAL HIGH (ref 8–23)
CO2: 19 mmol/L — ABNORMAL LOW (ref 22–32)
Calcium: 7.6 mg/dL — ABNORMAL LOW (ref 8.9–10.3)
Chloride: 96 mmol/L — ABNORMAL LOW (ref 98–111)
Creatinine, Ser: 9.15 mg/dL — ABNORMAL HIGH (ref 0.61–1.24)
GFR, Estimated: 5 mL/min — ABNORMAL LOW (ref >60)
Glucose, Bld: 136 mg/dL — ABNORMAL HIGH (ref 70–99)
Potassium: 6.3 mmol/L (ref 3.5–5.1)
Sodium: 133 mmol/L — ABNORMAL LOW (ref 135–145)

## 2023-09-24 LAB — CBC
HCT: 31.7 % — ABNORMAL LOW (ref 39.0–52.0)
Hemoglobin: 9.7 g/dL — ABNORMAL LOW (ref 13.0–17.0)
MCH: 30.9 pg (ref 26.0–34.0)
MCHC: 30.6 g/dL (ref 30.0–36.0)
MCV: 101 fL — ABNORMAL HIGH (ref 80.0–100.0)
Platelets: 136 K/uL — ABNORMAL LOW (ref 150–400)
RBC: 3.14 MIL/uL — ABNORMAL LOW (ref 4.22–5.81)
RDW: 17.3 % — ABNORMAL HIGH (ref 11.5–15.5)
WBC: 2.8 K/uL — ABNORMAL LOW (ref 4.0–10.5)
nRBC: 0 % (ref 0.0–0.2)

## 2023-09-24 LAB — POTASSIUM: Potassium: 5.3 mmol/L — ABNORMAL HIGH (ref 3.5–5.1)

## 2023-09-24 LAB — GLUCOSE, CAPILLARY
Glucose-Capillary: 164 mg/dL — ABNORMAL HIGH (ref 70–99)
Glucose-Capillary: 161 mg/dL — ABNORMAL HIGH (ref 70–99)

## 2023-09-24 LAB — HEPATITIS B SURFACE ANTIGEN: Hepatitis B Surface Ag: NONREACTIVE

## 2023-09-24 MED ORDER — ALBUTEROL SULFATE (2.5 MG/3ML) 0.083% IN NEBU
INHALATION_SOLUTION | RESPIRATORY_TRACT | Status: AC
  Administered 2023-09-24: 2.5 mg via RESPIRATORY_TRACT
  Filled 2023-09-24: qty 3

## 2023-09-24 MED ORDER — INSULIN ASPART 100 UNIT/ML IV SOLN
5.0000 [IU] | Freq: Once | INTRAVENOUS | Status: AC
  Administered 2023-09-24: 5 [IU] via INTRAVENOUS

## 2023-09-24 MED ORDER — ALBUTEROL SULFATE (2.5 MG/3ML) 0.083% IN NEBU
10.0000 mg | INHALATION_SOLUTION | Freq: Once | RESPIRATORY_TRACT | Status: AC
  Administered 2023-09-24: 10 mg via RESPIRATORY_TRACT
  Filled 2023-09-24: qty 12

## 2023-09-24 MED ORDER — CHLORHEXIDINE GLUCONATE CLOTH 2 % EX PADS: 6.0000 | MEDICATED_PAD | Freq: Every day | CUTANEOUS | Status: DC

## 2023-09-24 MED ORDER — CALCIUM GLUCONATE-NACL 1-0.675 GM/50ML-% IV SOLN
1.0000 g | Freq: Once | INTRAVENOUS | Status: AC
  Administered 2023-09-24: 1000 mg via INTRAVENOUS
  Filled 2023-09-24: qty 50

## 2023-09-24 MED ORDER — ALBUMIN HUMAN 25 % IV SOLN: 25.0000 g | INTRAVENOUS | Status: AC | PRN

## 2023-09-24 MED ORDER — SODIUM ZIRCONIUM CYCLOSILICATE 10 G PO PACK
10.0000 g | PACK | Freq: Once | ORAL | Status: AC
  Administered 2023-09-24: 10 g via ORAL
  Filled 2023-09-24: qty 1

## 2023-09-24 MED ORDER — DEXTROSE 50 % IV SOLN
1.0000 | Freq: Once | INTRAVENOUS | Status: AC
  Administered 2023-09-24: 50 mL via INTRAVENOUS
  Filled 2023-09-24: qty 50

## 2023-09-24 MED ORDER — TAMSULOSIN HCL 0.4 MG PO CAPS: 0.4000 mg | ORAL_CAPSULE | Freq: Every day | ORAL | Status: DC

## 2023-09-24 MED ORDER — ALBUTEROL SULFATE (2.5 MG/3ML) 0.083% IN NEBU: 2.5000 mg | INHALATION_SOLUTION | Freq: Once | RESPIRATORY_TRACT | Status: DC

## 2023-09-24 MED ORDER — ALBUTEROL SULFATE (2.5 MG/3ML) 0.083% IN NEBU
2.5000 mg | INHALATION_SOLUTION | RESPIRATORY_TRACT | Status: DC | PRN
  Administered 2023-09-24: 2.5 mg via RESPIRATORY_TRACT
  Filled 2023-09-24: qty 3

## 2023-09-24 MED ORDER — MIDODRINE HCL 5 MG PO TABS
5.0000 mg | ORAL_TABLET | Freq: Three times a day (TID) | ORAL | Status: DC
  Administered 2023-09-24 – 2023-09-25 (×4): 5 mg via ORAL
  Filled 2023-09-24 (×5): qty 1

## 2023-09-24 NOTE — TOC Initial Note (Signed)
 Transition of Care [facility]) - Initial/Assessment Note   Patient Details  Name: Ronald [name] MRN: [id number] Date of Birth: 03-04-1940  [facility]) CM/SW Contact:    Nena LITTIE Coffee, RN Phone Number: 09/24/2023, 5:41 PM  Clinical Narrative:                 Pt admitted c/acute respiratory failure with hypoxia, dementia. From home c/wife who is capable pcg. Pt has dialysis M-W-F at [facility]. He missed Monday this week and Wednesday was only 1.5 hours. Wife plans for pt to return home at dc and will provide transportation.  Pt has a walker and raised toilet seat c/side bars.  Expected Discharge Plan: Home/Self Care Barriers to Discharge: Continued Medical Work up  Patient Goals and CMS Choice Patient states their goals for this hospitalization and ongoing recovery are:: Wife wants pt to return home   Expected Discharge Plan and Services In-house Referral: Clinical Social Work Discharge Planning Services: CM Consult   Living arrangements for the past 2 months: Single Family Home       Prior Living Arrangements/Services Living arrangements for the past 2 months: Single Family Home Lives with:: Spouse Patient language and need for interpreter reviewed:: Yes Do you feel safe going back to the place where you live?: Yes      Need for Family Participation in Patient Care: Yes (Comment) Care giver support system in place?: Yes (comment) Current home services: DME (walker, raised toilet seat c/bars) Criminal Activity/Legal Involvement Pertinent to Current Situation/Hospitalization: No - Comment as needed  Activities of Daily Living   ADL Screening (condition at time of admission) Independently performs ADLs?: No Does the patient have a NEW difficulty with bathing/dressing/toileting/self-feeding that is expected to last >3 days?: No Does the patient have a NEW difficulty with getting in/out of bed, walking, or climbing stairs that is expected to last >3 days?:  No Does the patient have a NEW difficulty with communication that is expected to last >3 days?: No Is the patient deaf or have difficulty hearing?: No Does the patient have difficulty seeing, even when wearing glasses/contacts?: No Does the patient have difficulty concentrating, remembering, or making decisions?: No  Permission Sought/Granted Permission sought to share information with : Case Manager, Family Supports Permission granted to share information with : Yes, Verbal Permission Granted   Emotional Assessment Appearance:: Appears stated age   Affect (typically observed): Calm Orientation: : Oriented to Self Alcohol / Substance Use: Not Applicable Psych Involvement: No (comment)  Admission diagnosis:  COPD exacerbation (HCC) [J44.1] Acute respiratory failure with hypoxia (HCC) [J96.01] Hypervolemia associated with renal insufficiency [E87.70, N28.9] Acute hypoxic respiratory failure (HCC) [J96.01] Patient Active Problem List   Diagnosis Date Noted   Acute respiratory failure with hypoxia (HCC) 09/23/2023   Hardening of the aorta (main artery of the heart) (HCC) 08/13/2023   Kidney lesion 08/13/2023   Senile dementia (HCC) 08/13/2023   TIA (transient ischemic attack) 08/07/2023   Dyspnea and respiratory abnormalities 07/08/2023   Advance care planning 06/29/2023   DNR (do not resuscitate) 06/29/2023   Hyperkalemia 06/29/2023   Ischemic colitis (HCC) 06/29/2023   Upper GI bleed 06/28/2023   Status post AAA (abdominal aortic aneurysm) repair 06/22/2023   Acute anemia 06/22/2023   Nonrheumatic aortic valve stenosis 06/22/2023   Atrial fibrillation with controlled ventricular response (HCC) 06/20/2023   Persistent atrial fibrillation (HCC) 06/18/2023   AAA (abdominal aortic aneurysm) (HCC) 06/17/2023   Moderate aortic valve stenosis 04/29/2023   Edema  of lower extremity 12/18/2022   Tobacco dependence due to cigarettes 11/27/2022   Acute non-ST segment elevation  myocardial infarction [facility]) 11/27/2022   Chronic obstructive pulmonary disease (HCC) 11/27/2022   Acute congestive heart failure (HCC) 11/27/2022   Paroxysmal A-fib (HCC) 11/12/2022   NSTEMI (non-ST elevated myocardial infarction) (HCC) 11/12/2022   History of open heart surgery 11/12/2022   Chronic congestive heart failure (HCC) 11/12/2022   Tobacco abuse 11/12/2022   Skin lesion 04/09/2022   Age-related macular degeneration 04/09/2022   Anemia 04/09/2022   Benign prostatic hyperplasia 04/09/2022   Glaucoma 04/09/2022   Minimal cognitive impairment 07/05/2021   End-stage renal disease on hemodialysis (HCC) 03/14/2021   Secondary hyperparathyroidism (HCC) 03/14/2021   Abdominal aortic aneurysm (HCC) 03/14/2021   Atherosclerosis of coronary artery without angina pectoris 03/14/2021   Chronic insomnia 03/14/2021   Chronic low back pain 03/14/2021   Diarrhea 03/14/2021   Gastroesophageal reflux disease without esophagitis 03/14/2021   Mixed hyperlipidemia 03/14/2021   Gross hematuria 04/13/2020   Acute prostatitis 04/13/2020   Hypertension 05/04/2019   Chronic kidney disease, stage IV (severe) (HCC) 05/04/2019   Prostate cancer (HCC) 11/10/2017   PCP:  Shona Norleen PEDLAR, MD Pharmacy:   [facility]) [place], [place] - [address] [facility] [address] [place] [address] [place] [address] Phone: [phone] Fax: [phone]  [id number] - [place],  - [address] AT [facility] [address] [place] [address] Phone: [phone] Fax: [phone]  [facility] [address] [place] [address] Phone: [phone] Fax: [phone]  [facility] [place], [place] - [address] [place] [address] Phone: [phone] Fax: [phone]  Social Drivers of Health (SDOH) Social History: SDOH Screenings   Food Insecurity: No Food Insecurity (09/23/2023)   Recent Concern: Food Insecurity - Food Insecurity Present (08/12/2023)  Housing: Low Risk  (09/23/2023)  Transportation Needs: No Transportation Needs (09/23/2023)  Utilities: Patient Unable To Answer (09/23/2023)  Depression (PHQ2-9): High Risk (09/21/2023)  Financial Resource Strain: Low Risk  (07/07/2023)  Physical Activity: Inactive (07/07/2023)  Social Connections: Unknown (09/23/2023)  Stress: No Stress Concern Present (07/07/2023)  Tobacco Use: High Risk (09/23/2023)  Health Literacy: Inadequate Health Literacy (08/06/2023)   SDOH Interventions:   Readmission Risk Interventions    09/24/2023    5:39 PM 07/02/2023   10:31 AM 06/23/2023    3:01 PM  Readmission Risk Prevention Plan  Transportation Screening Complete Complete Complete  HRI or Home Care Consult   Complete  Social Work Consult for Recovery Care Planning/Counseling   Complete  Palliative Care Screening   Not Applicable  Medication Review [employer]) Complete Complete Complete  PCP or Specialist appointment within 3-5 days of discharge Complete Complete   HRI or Home Care Consult Complete Complete   SW Recovery Care/Counseling Consult Complete    Palliative Care Screening Not Applicable    Skilled Nursing Facility Not Applicable

## 2023-09-24 NOTE — Procedures (Signed)
 HD Note:  Some information was entered later than the data was gathered due to patient care needs. The stated time with the data is accurate.  Received patient in bed to unit.   Alert and oriented to self, confused about situation.  Sitter with patient during treatment.   Informed consent signed and in chart.   Access used: Left forearm fistula Access issues: No issues  Patient tolerated treatment well.  He asked questions several times to understand how we determine if the dialysis is doing well for him.  He was not satisfied to be told it keeps him well. Patient was pleasant but dissatisfied during treatment.  Patient moved frequently during treatment.  The venous pressure increased.  BFR reduced to 300.  Patient was redirected by staff during the treatment.  In the last 30 min his restlessness increased and treatment was terminated 12 min early for safety.  Staff was no longer able to restrict movement of the access arm without restraint.  Patient was intolerant of any restraining interaction..  TX duration: 3 hours and 18 min  Alert, without acute distress.  Total UF removed: 2800 ml  Hand-off given to patient's nurse.   Transported back to the room   Virgil Slinger L. Lenon, RN Kidney Dialysis Unit.

## 2023-09-24 NOTE — Plan of Care (Signed)
  Problem: Education: Goal: Knowledge of General Education information will improve Description: Including pain rating scale, medication(s)/side effects and non-pharmacologic comfort measures Outcome: Not Progressing   Problem: Health Behavior/Discharge Planning: Goal: Ability to manage health-related needs will improve Outcome: Not Progressing   Problem: Clinical Measurements: Goal: Ability to maintain clinical measurements within normal limits will improve Outcome: Progressing Goal: Diagnostic test results will improve Outcome: Progressing Goal: Respiratory complications will improve Outcome: Not Progressing Goal: Cardiovascular complication will be avoided Outcome: Not Progressing   Problem: Activity: Goal: Risk for activity intolerance will decrease Outcome: Not Progressing   Problem: Nutrition: Goal: Adequate nutrition will be maintained Outcome: Not Progressing   Problem: Coping: Goal: Level of anxiety will decrease Outcome: Not Progressing   Problem: Elimination: Goal: Will not experience complications related to bowel motility Outcome: Not Progressing Goal: Will not experience complications related to urinary retention Outcome: Not Progressing   Problem: Pain Managment: Goal: General experience of comfort will improve and/or be controlled Outcome: Not Progressing   Problem: Safety: Goal: Ability to remain free from injury will improve Outcome: Not Progressing   Problem: Skin Integrity: Goal: Risk for impaired skin integrity will decrease Outcome: Not Progressing

## 2023-09-24 NOTE — Plan of Care (Signed)
  Problem: Education: Goal: Knowledge of General Education information will improve Description: Including pain rating scale, medication(s)/side effects and non-pharmacologic comfort measures Outcome: Not Met (add Reason)   Problem: Health Behavior/Discharge Planning: Goal: Ability to manage health-related needs will improve Outcome: Not Met (add Reason)

## 2023-09-24 NOTE — Progress Notes (Signed)
 Progress Note   Patient: Ronald [name] FMW:[id number] DOB: Oct 06, 1940 DOA: 09/23/2023     1 DOS: the patient was seen and examined on 09/24/2023   Brief hospital admission narrative: Ronald [name] is a 83 y.o. male with medical history significant of hypertension, hyperlipidemia, prostate cancer, ESRD, diastolic heart failure, dementia, GERD and tobacco abuse; who presented from dialysis center secondary to worsening shortness of breath and hypoxia.  Patient's wife reporting URI symptoms for the last 4 days or so that have worsened up on the day of admission.  A dialysis patient had 1.5 hours left for treatment.  Folded in a second supplementation in place; mildly productive intermittent coughing spells reported.  No fever, no nausea, no vomiting, no chest pain, no dysuria, no hematuria, no focal weaknesses or any other complaints. Workup in the ED demonstrating no frank infiltrates, positive vascular congestion and diffuse wheezing on examination with bibasilar crackles.   Nebulizer treatment, steroids, Lasix  and IV antibiotics given at time of admission; cultures taken.  TRH contacted to place patient in the hospital for further evaluation and management.  Assessment and plan 1-acute respiratory failure with hypoxia - Appears to be secondary mainly to COPD exacerbation, with maybe mild component of vascular congestion and volume overload from yesterday.  - Continue treatment with his steroids, bronchodilator management and wean off oxygen supplementation as tolerated - Oral antibiotics, mucolytic's and flutter valve has also been ordered  2-ESRD/hyperkalemia - Planning for hemodialysis treatment later today - Appreciate assistance and recommendation by nephrology service - Patient will receive short session on 09/25/2023 to get him back on schedule in anticipation to discharge home with no further dialysis until 09/28/2023. - Treatment for hyperkalemia overnight included calcium   gluconate, bicarb, insulin  and Lokelma . - Follow electrolytes trend.  3-chronic anemia -In the setting of renal failure - No overt bleeding appreciated - Continue to follow hemoglobin trend - Epogen therapy and IV iron  as per nephrology discretion.  4-advanced dementia - Continue supportive care and constant reorientation - Sitter at bedside - Continue the use of Seroquel  - Overnight severely aggressive/agitated and refusing therapy. - Currently improved and allowing nursing staff to care for him and provide medications - Continue to follow clinical response. - Palliative care has been requested - Patient will benefit of outpatient hospice at discharge.  5-tobacco abuse - Cessation counseling provided - Continue nicotine  patch.  6-prior history of stroke - Continue secondary prevention with Plavix  - Continue risk factor modifications.  Subjective:  In no acute distress at time of examination; following simple commands and demonstrating improvement in his breathing.  Physical Exam: Vitals:   09/24/23 0852 09/24/23 1538 09/24/23 1842 09/24/23 1851  BP: (!) 130/92 118/73 126/80 114/74  Pulse: 90 87 91 86  Resp:   13 14  Temp:      TempSrc:      SpO2:  100% 99% 100%  Weight:      Height:       General exam: Alert, awake, oriented x 1; following commands appropriately.  In no acute distress. Respiratory system: Positive rhonchi and expiratory wheezing appreciated bilaterally; improved air movement.  Decreased breath sounds at the bases without frank crackles. Cardiovascular system: Rate controlled, no rubs, no gallops, no JVD. Gastrointestinal system: Abdomen is nondistended, soft and nontender. No organomegaly or masses felt. Normal bowel sounds heard. Central nervous system: Moving 4 limbs spontaneously.  No focal neurological deficits. Extremities: No cyanosis or clubbing; 2+ edema appreciated bilaterally. Skin: No petechiae. Psychiatry: Judgement and insight  appear  impaired secondary to underlying dementia; flat affect on exam.   Data Reviewed: CBC: WBC 2.8, hemoglobin 9.7 and platelet count 136K Basic metabolic panel: Sodium 133, potassium 6.3, chloride 96, bicarb 19, BUN 56, creatinine 9.15 and GFR 5  Family Communication: Wife at bedside.  Disposition: Status is: Inpatient Remains inpatient appropriate because: Continue treatment with steroids and bronchodilators to further stabilize respiratory status.  Follow recommendation by nephrology service regarding further dialysis intervention.  Anticipating discharge back home once medically stable.  Time spent: 50 minutes  Author: Eric Nunnery, MD 09/24/2023 6:55 PM  For on call review [email or web address].

## 2023-09-24 NOTE — Progress Notes (Signed)
 Pt receives op HD at [facility], MWF, [place] chair time. Contacted clinic to inform of pt arrival, clinic stated that when agitated, clinic staff can typically re-orient him by calling him Mr. Jac. Will continue to assist as needed.   [facility] Hanaa Payes Dialysis Navigator [phone] -[facility] #- [phone]

## 2023-09-24 NOTE — Consult Note (Addendum)
 [facility]  INPATIENT CONSULTATION  Reason for Consultation: ESRD, hyperkalemia, hypervolemia Requesting Provider: Dr. Ricky  HPI: Ronald [name] is an 83 y.o. male with hypertension, hyperlipidemia, prostate cancer, ESRD, diastolic heart failure, dementia, GERD and tobacco abuse currently admitted for hypoxia and nephrology is consulted for comanagement of ESRD, hypervolemia and hyperkalemia.   Pt was at usual dialysis session yesterday and with remaining in tx become increasingly dyspneic prompting EMS transfer to ED.  Found to have diffuse wheezing c/w COPD for which he's being treated but imaging also with pulmonary edema.  He's being treated with doxycycline , nebs, steroids.    This morning K was 6.3 and he received lokelma .  He denies complaints this AM.  He was agitated overnight and has a Comptroller.  He is currenlty calm.    PMH: Past Medical History:  Diagnosis Date   CHF (congestive heart failure) (HCC)    ESRD on hemodialysis (HCC)    GERD (gastroesophageal reflux disease)    Glaucoma    Hypertension    Macular degeneration    Myocardial infarction [facility])    1999   Prostate cancer (HCC) 02/2018   Stroke (HCC)    no deficits; had stroke while trying to place stents in legs.   PSH: Past Surgical History:  Procedure Laterality Date   ABDOMINAL AORTIC ANEURYSM REPAIR  2012   IN [place]    ABDOMINAL AORTIC ENDOVASCULAR STENT GRAFT N/A 06/17/2023   Procedure: INSERTION, ENDOVASCULAR STENT GRAFT, AORTA, ABDOMINAL;  Surgeon: Gretta Lonni PARAS, MD;  Location: [facility] OR;  Service: Vascular;  Laterality: N/A;   APPENDECTOMY     AV FISTULA PLACEMENT Left 10/16/2020   Procedure: LEFT ARM ARTERIOVENOUS (AV) FISTULA CREATION;  Surgeon: Oris Krystal FALCON, MD;  Location: [facility] ORS;  Service: Vascular;  Laterality: Left;   CORONARY ARTERY BYPASS GRAFT  01/1998   3 vessels 18 years ago   ENDARTERECTOMY FEMORAL Right 06/17/2023   Procedure: ENDARTERECTOMY, COMMON FEMORAL  AND SUPERFICIAL FEMORAL;  Surgeon: Gretta Lonni PARAS, MD;  Location: [facility] OR;  Service: Vascular;  Laterality: Right;   INGUINAL HERNIA REPAIR Right 09/20/2015   Procedure: REPAIR RIGHT INGUINAL HERNIA  WITH MESH;  Surgeon: Selinda Artist Moats, MD;  Location: [facility] ORS;  Service: General;  Laterality: Right;   INSERTION OF DIALYSIS CATHETER Right 08/02/2020   Procedure: INSERTION OF RIGHT TUNNELED INTERNAL JUGULAR  DIALYSIS CATHETER;  Surgeon: Oris Krystal FALCON, MD;  Location: [facility];  Service: Vascular;  Laterality: Right;   INSERTION OF ILIAC STENT Right 06/17/2023   Procedure: INSERTION, STENT, ARTERY, ILIAC;  Surgeon: Gretta Lonni PARAS, MD;  Location: [facility] OR;  Service: Vascular;  Laterality: Right;   LOWER EXTREMITY ANGIOGRAM Right 06/17/2023   Procedure: GERALYN, LOWER EXTREMITY;  Surgeon: Gretta Lonni PARAS, MD;  Location: [facility] OR;  Service: Vascular;  Laterality: Right;   PATCH ANGIOPLASTY Right 06/17/2023   Procedure: ANGIOPLASTY, USING PATCH GRAFT USING GEORGE LINTS;  Surgeon: Gretta Lonni PARAS, MD;  Location: [facility];  Service: Vascular;  Laterality: Right;   REMOVAL OF A DIALYSIS CATHETER N/A 03/19/2021   Procedure: MINOR REMOVAL OF A TUNNELED DIALYSIS CATHETER;  Surgeon: Oris Krystal FALCON, MD;  Location: [facility];  Service: Vascular;  Laterality: N/A;   tumor removal from bladder     ULTRASOUND GUIDANCE FOR VASCULAR ACCESS Bilateral 06/17/2023   Procedure: ULTRASOUND GUIDANCE, FOR VASCULAR ACCESS;  Surgeon: Gretta Lonni PARAS, MD;  Location: [facility] OR;  Service: Vascular;  Laterality: Bilateral;    Past Medical History:  Diagnosis Date   CHF (congestive heart failure) (HCC)    ESRD on hemodialysis (HCC)    GERD (gastroesophageal reflux disease)    Glaucoma    Hypertension    Macular degeneration    Myocardial infarction [facility])    1999   Prostate cancer (HCC) 02/2018   Stroke (HCC)    no deficits; had stroke while trying to place stents in legs.    Medications:  I have reviewed the patient's  current medications.  Medications Prior to Admission  Medication Sig Dispense Refill   acetaminophen  (TYLENOL ) 500 MG tablet Take 1,000 mg by mouth every 8 (eight) hours as needed for mild pain (pain score 1-3).     amiodarone  (PACERONE ) 200 MG tablet Take 1 tablet (200 mg total) by mouth daily. 90 tablet 0   atorvastatin  (LIPITOR) 80 MG tablet Take 80 mg by mouth in the morning.     BREZTRI  AEROSPHERE 160-9-4.8 MCG/ACT AERO inhaler Inhale 2 puffs into the lungs 2 (two) times daily.     clopidogrel  (PLAVIX ) 75 MG tablet Take 75 mg by mouth daily.     LORazepam  (ATIVAN ) 1 MG tablet Take 1 mg by mouth at bedtime as needed for sleep.     metoprolol  succinate (TOPROL -XL) 50 MG 24 hr tablet Take 50 mg by mouth daily. TAKE WITH OR IMMEDIATELY FOLLOWING A MEAL.     midodrine  (PROAMATINE ) 10 MG tablet Take 10 mg by mouth every dialysis (low blood pressure).     pantoprazole  (PROTONIX ) 40 MG tablet Take 1 tablet (40 mg total) by mouth 2 (two) times daily for 60 days, THEN 1 tablet (40 mg total) daily. (Patient taking differently: Take 1 tablet (40 mg) by mouth daily.) 150 tablet 0   tamsulosin  (FLOMAX ) 0.4 MG CAPS capsule Take 1 capsule (0.4 mg total) by mouth daily after supper. 90 capsule 3    ALLERGIES:  No Known Allergies  FAM HX: Family History  Problem Relation Age of Onset   Cancer Father     Social History:   reports that he has been smoking cigarettes. He started smoking about 66 years ago. He has a 33.1 pack-year smoking history. He has never used smokeless tobacco. He reports that he does not drink alcohol and does not use drugs.  ROS: 12 system ROS neg except per HPI   Blood pressure (!) 130/92, pulse 90, temperature (!) 97.4 F (36.3 C), temperature source Oral, resp. rate 20, height 6' 2 (1.88 m), weight 96.6 kg, SpO2 100%. PHYSICAL EXAM: Gen: calm sitting up in bed  Eyes: EOMI, glasses Neck: supple, elevated JVD upright CV:  irreg irreg Abd: soft lungs: diffuse wheezes, a  few rales, normal WOB at rest Extr: 2+ LE edema, L forearm AVF +t/b Neuro: Awake and alert, answers simple questions   Results for orders placed or performed during the hospital encounter of 09/23/23 (from the past 48 hours)  Comprehensive metabolic panel     Status: Abnormal   Collection Time: 09/23/23  2:17 PM  Result Value Ref Range   Sodium 134 (L) 135 - 145 mmol/L   Potassium 4.8 3.5 - 5.1 mmol/L   Chloride 93 (L) 98 - 111 mmol/L   CO2 23 22 - 32 mmol/L   Glucose, Bld 79 70 - 99 mg/dL    Comment: Glucose reference range applies only to samples taken after fasting for at least 8 hours.   BUN 44 (H) 8 - 23 mg/dL   Creatinine, Ser 2.40 (H) 0.61 - 1.24  mg/dL   Calcium  7.7 (L) 8.9 - 10.3 mg/dL   Total Protein 6.0 (L) 6.5 - 8.1 g/dL   Albumin  2.9 (L) 3.5 - 5.0 g/dL   AST 34 15 - 41 U/L   ALT 22 0 - 44 U/L   Alkaline Phosphatase 124 38 - 126 U/L   Total Bilirubin 0.8 0.0 - 1.2 mg/dL   GFR, Estimated 7 (L) >60 mL/min    Comment: (NOTE) Calculated using the CKD-EPI Creatinine Equation (2021)    Anion gap 18 (H) 5 - 15    Comment: Performed at [facility], [address] [place], [place] [address]  Lactic acid, plasma     Status: None   Collection Time: 09/23/23  2:17 PM  Result Value Ref Range   Lactic Acid, Venous 1.6 0.5 - 1.9 mmol/L    Comment: Performed at [facility], [address] [place], [place] [address]  CBC with Differential     Status: Abnormal   Collection Time: 09/23/23  2:17 PM  Result Value Ref Range   WBC 3.3 (L) 4.0 - 10.5 K/uL   RBC 2.98 (L) 4.22 - 5.81 MIL/uL   Hemoglobin 9.4 (L) 13.0 - 17.0 g/dL   HCT 69.9 (L) 60.9 - 47.9 %   MCV 100.7 (H) 80.0 - 100.0 fL   MCH 31.5 26.0 - 34.0 pg   MCHC 31.3 30.0 - 36.0 g/dL   RDW 82.7 (H) 88.4 - 84.4 %   Platelets 138 (L) 150 - 400 K/uL   nRBC 0.0 0.0 - 0.2 %   Neutrophils Relative % 53 %   Neutro Abs 1.8 1.7 - 7.7 K/uL   Lymphocytes Relative 34 %   Lymphs Abs 1.1 0.7 - 4.0 K/uL   Monocytes Relative 11 %    Monocytes Absolute 0.4 0.1 - 1.0 K/uL   Eosinophils Relative 1 %   Eosinophils Absolute 0.0 0.0 - 0.5 K/uL   Basophils Relative 0 %   Basophils Absolute 0.0 0.0 - 0.1 K/uL   Immature Granulocytes 1 %   Abs Immature Granulocytes 0.04 0.00 - 0.07 K/uL    Comment: Performed at [facility], [address] [place], [place] [address]  Protime-INR     Status: None   Collection Time: 09/23/23  2:17 PM  Result Value Ref Range   Prothrombin Time 15.2 11.4 - 15.2 seconds   INR 1.1 0.8 - 1.2    Comment: (NOTE) INR goal varies based on device and disease states. Performed at [facility], [address], [place], [place] [address]   Culture, blood (Routine x 2)     Status: None (Preliminary result)   Collection Time: 09/23/23  2:17 PM   Specimen: BLOOD  Result Value Ref Range   Specimen Description BLOOD BLOOD RIGHT ARM    Special Requests      BOTTLES DRAWN AEROBIC AND ANAEROBIC Blood Culture results may not be optimal due to an inadequate volume of blood received in culture bottles   Culture      NO GROWTH < 24 HOURS Performed at [facility], [address] [place], [place] [address]    Report Status PENDING   Culture, blood (Routine x 2)     Status: None (Preliminary result)   Collection Time: 09/23/23  2:17 PM   Specimen: BLOOD  Result Value Ref Range   Specimen Description BLOOD BLOOD RIGHT HAND    Special Requests      BOTTLES DRAWN AEROBIC AND ANAEROBIC Blood Culture results may not be  optimal due to an inadequate volume of blood received in culture bottles   Culture      NO GROWTH < 24 HOURS Performed at [facility], [address], [place], [place] [address]    Report Status PENDING   Blood gas, venous     Status: Abnormal   Collection Time: 09/23/23  2:19 PM  Result Value Ref Range   pH, Ven 7.34 7.25 - 7.43   pCO2, Ven 51 44 - 60 mmHg   pO2, Ven <31 (LL) 32 - 45 mmHg    Comment: CRITICAL RESULT CALLED TO, READ BACK BY AND VERIFIED WITH: BAIN,C ON 09/23/23 AT 1510 BY  LOY,C    Bicarbonate 28.1 (H) 20.0 - 28.0 mmol/L   Acid-Base Excess 1.7 0.0 - 2.0 mmol/L   O2 Saturation 23.9 %   Patient temperature 36.8    Collection site BLOOD RIGHT HAND    Drawn by 442     Comment: Performed at [facility], [address], [place], [place] [address]  Lactic acid, plasma     Status: None   Collection Time: 09/23/23  3:48 PM  Result Value Ref Range   Lactic Acid, Venous 1.5 0.5 - 1.9 mmol/L    Comment: Performed at [facility], [address], [place], [place] [address]  Brain natriuretic peptide     Status: Abnormal   Collection Time: 09/23/23  3:48 PM  Result Value Ref Range   B Natriuretic Peptide 4,030.0 (H) 0.0 - 100.0 pg/mL    Comment: Performed at [facility], [address] [place], [place] [address]  CBC     Status: Abnormal   Collection Time: 09/23/23  3:48 PM  Result Value Ref Range   WBC 3.4 (L) 4.0 - 10.5 K/uL   RBC 2.89 (L) 4.22 - 5.81 MIL/uL   Hemoglobin 9.1 (L) 13.0 - 17.0 g/dL   HCT 71.0 (L) 60.9 - 47.9 %   MCV 100.0 80.0 - 100.0 fL   MCH 31.5 26.0 - 34.0 pg   MCHC 31.5 30.0 - 36.0 g/dL   RDW 82.7 (H) 88.4 - 84.4 %   Platelets 132 (L) 150 - 400 K/uL   nRBC 0.0 0.0 - 0.2 %    Comment: Performed at [facility], [address], [place], [place] [address]  Creatinine, serum     Status: Abnormal   Collection Time: 09/23/23  3:48 PM  Result Value Ref Range   Creatinine, Ser 8.00 (H) 0.61 - 1.24 mg/dL   GFR, Estimated 6 (L) >60 mL/min    Comment: (NOTE) Calculated using the CKD-EPI Creatinine Equation (2021) Performed at [facility], [address], [place], [place] [address]   CBC     Status: Abnormal   Collection Time: 09/24/23  4:22 AM  Result Value Ref Range   WBC 2.8 (L) 4.0 - 10.5 K/uL   RBC 3.14 (L) 4.22 - 5.81 MIL/uL   Hemoglobin 9.7 (L) 13.0 - 17.0 g/dL   HCT 68.2 (L) 60.9 - 47.9 %   MCV 101.0 (H) 80.0 - 100.0 fL   MCH 30.9 26.0 - 34.0 pg   MCHC 30.6 30.0 - 36.0 g/dL   RDW 82.6 (H) 88.4 - 84.4 %   Platelets 136 (L) 150 -  400 K/uL   nRBC 0.0 0.0 - 0.2 %    Comment: Performed at [facility] [address], [place], [place] [address]  Basic metabolic panel with GFR     Status: Abnormal   Collection Time: 09/24/23  4:22 AM  Result Value Ref Range   Sodium 133 (L) 135 - 145 mmol/L   Potassium 6.3 (HH) 3.5 - 5.1 mmol/L    Comment: CRITICAL RESULT CALLED TO, READ BACK BY AND VERIFIED WITH DRIS ZINE [id number], VIRAY,J   Chloride 96 (L) 98 - 111 mmol/L   CO2 19 (L) 22 - 32 mmol/L   Glucose, Bld 136 (H) 70 - 99 mg/dL    Comment: Glucose reference range applies only to samples taken after fasting for at least 8 hours.   BUN 56 (H) 8 - 23 mg/dL   Creatinine, Ser 0.84 (H) 0.61 - 1.24 mg/dL   Calcium  7.6 (L) 8.9 - 10.3 mg/dL   GFR, Estimated 5 (L) >60 mL/min    Comment: (NOTE) Calculated using the CKD-EPI Creatinine Equation (2021)    Anion gap 18 (H) 5 - 15    Comment: Performed at [facility], [address] [place], [place] [address]  Glucose, capillary     Status: Abnormal   Collection Time: 09/24/23  6:44 AM  Result Value Ref Range   Glucose-Capillary 164 (H) 70 - 99 mg/dL    Comment: Glucose reference range applies only to samples taken after fasting for at least 8 hours.    DG Chest Port 1 View if patient is in a treatment room. Result Date: 09/23/2023 CLINICAL DATA:  Dyspnea.  Concern for sepsis. EXAM: PORTABLE CHEST 1 VIEW COMPARISON:  07/09/2023. FINDINGS: Stable cardiomegaly. Unchanged median sternotomy. Pulmonary vascular congestion with diffuse bilateral interstitial prominence, increased since the prior exam, suggestive of interstitial pulmonary edema, although, atypical infection could have a similar appearance in the appropriate clinical setting. Suspected small bilateral pleural effusions. No pneumothorax. No acute osseous abnormality. IMPRESSION: 1. Cardiomegaly with pulmonary vascular congestion and diffuse bilateral interstitial prominence, increased since the prior exam, suggestive of  interstitial pulmonary edema, although, atypical infection could have a similar appearance in the appropriate setting. 2. Suspected small bilateral pleural effusions. Electronically Signed   By: Harrietta Sherry M.D.   On: 09/23/2023 15:57    Assessment/PlanBogdan J [name] is an 83 y.o. male with hypertension, hyperlipidemia, prostate cancer, ESRD, diastolic heart failure, dementia, GERD and tobacco abuse currently admitted for hypoxia and nephrology is consulted for comanagement of ESRD, hypervolemia and hyperkalemia.   **AHRF:  multifactorial - COPD + volume.  COPD tx per primary.  WIll do extra dialysis today - 3.5h tx, goal 3L - BP on low side so will provide albumin  support and midodrine  PRN.    **Hyperkalemia:  K 6.3 this AM, rec'd med mgmt this AM.  Will recheck prior to HD and use appropriate dialysate K.   **ESRD on HD: MWF [facility] - HD off schedule today.  Next HD Friday.   **AMS: dementia, often with delerium in hospital.  Currently has sitter.    **Anemia:  Hb 9s, if remains admitted will give ESA.   See primary has d/w wife his dementia and decline - cont full scope for now.  Palliative care has been consulted.  Will see how he tolerates dialysis hemodynamically as well as behaviorally.   Will continue to follow.   Manuelita DELENA Barters 09/24/2023, 9:21 AM

## 2023-09-24 NOTE — Progress Notes (Signed)
 Patient's potassium level returned at 6.3 this morning. The patient reported not feeling well and agreed to receive medical treatment. All medical interventions ordered by Dr. Charlton were completed as ordered.

## 2023-09-24 NOTE — Consult Note (Addendum)
 Consultation Note Date: 09/24/2023   Patient Name: Ronald [name]  DOB: 1940-05-12  MRN: [id number]  Age / Sex: 83 y.o., male  PCP: Shona Norleen PEDLAR, MD Referring Physician: Ricky Fines, MD  Reason for Consultation: Establishing goals of care  HPI/Patient Profile: 83 y.o. male  with medical history significant for hypertension, hyperlipidemia, prostate cancer, ESRD on dialysis, HFmrEF, dementia, GERD, AAA status post repair, and tobacco abuse admitted on 09/23/2023 with acute hypoxic respiratory failure, COPD exacerbation, and fluid overload secondary to end-stage renal disease.   Patient presented to the ED from dialysis for cough and shortness of breath.  He was subsequently admitted to the unit for above dx.  Patient has advanced dementia with significant behavioral disturbances.  He is a dialysis patient but apparently has been missing several dialysis sections due to not feeling well. He has had 4 acute care setting encounters in the past 6 months for various issues.   PMT has been consulted to assist with goals of care conversation.  Today, Labs and imaging reviewed.  CMP reveals significant abnormalities including elevated potassium level at 6.3.  Sodium level low at 133.  CO2 19.  Renal function elevated consistent with end-stage renal disease.  Hypocalcemia with a calcium  level 7.6.  Venous blood gas reveals PO2 of less than 31.  Bicarb elevated at 28.1.  pH and CO2 normal.  BNP also significantly elevated at 4030.  White blood cell count low at 2.8.  Hemoglobin in the 9 range on both today and yesterday's labs.  EKG independently reviewed.  Left bundle branch block with occasional PVCs noted.  Chest x-ray independently reviewed with pulmonary vascular congestion noted.  Vital signs reveals most recent O2 levels are stable on room air blood pressure readings have been variable with some intermittent hypotension.  Overnight, he became agitated and was refusing  care.  For patient safety, a sitter was assigned.  He did receive Tylenol  this morning for some abdominal pain.  No Zofran  needed for nausea and 24-hour look back.  He is on scheduled quetiapine  although quite low-dose at 12.5 mg twice daily.   Sitter at bedside with patient. He states he is not feeling well but is not able to further elaborate. He denies any pain, shortness of breath, nausea, vomiting, or difficulty sleeping.   Independent history obtained from nursing staff and family due to patient's baseline cognitive status (advanced dementia).  Nursing staff share about patient's behavioral concerns last night. Currently behaviors are managed.  They also note he is eating very very little about 0 to 25% of meals. See below for history provided by wife.  Clinical Assessment and Goals of Care:  I have reviewed medical records including EPIC notes, labs and imaging (independently reviewed), medication administration record, vital signs, prior hospital admission notes, outpatient notes, assessed the patient and then met with patient and wife to discuss diagnosis prognosis, GOC, EOL wishes, disposition and options. Collaborated with admitting MD, TOC, bedside nursing.   I introduced Palliative Medicine as specialized medical care for people living with serious illness. It focuses on providing relief from the symptoms and stress of a serious illness. The goal is to improve quality of life for both the patient and the family.  We discussed a brief life review of the patient and then focused on their current illness.   I attempted to elicit values and goals of care important to the patient.    Medical History Review and Family/Patient Understanding:   Patient's wife has a  fair understanding of current health status.  She is aware that he is very ill and at end-of-life.  She acknowledges that she knows he does not have much time.  She is aware though that if they stop dialysis he will even more  rapidly progressed to active end-of-life phase.  Social History:  Patient lives at home with wife.  They have 2 children neither of which live in the area.  The wife also has a brother who is here visiting from [place] but does not live here.  He is helping for now but is only here for 1 month.  Patient and wife have been married for 58 years.  They are soul mates and have always been there and supported one another.  They moved here from [place] 40 or so years ago and speak [place] and [place].  Functional and Nutritional State:  Patient's wife states that she has noted significant declines over the past few months.  She shares that his cognitive status has significantly declined and he no longer recognizes people he has known for years.  He continues to recognize wife and daughter but struggles with other people.  He also repeats questions and has very limited short-term recall.  She also notes that he has started to eat very little as of late and appetite is poor.  She shares he uses a walker to get around but has been having more trouble lately.  He is continent but she has to check after him to ensure proper hygiene post toileting.  She shares he will become very fearful if she is not always nearby.  She also notes they have had great difficulty getting him to dialysis lately.  He often refuses to go and when he does go often asked to come off the machine early.  She states that when he has dialysis he typically tolerates that physically well but does have some behaviors that are manageable at present.  He does feel quite poorly when he does not go to dialysis and if he misses 1 session she is usually able to talk him into attending the next scheduled session.  Appetite also noted to be very poor while in the hospital eating typically 25% or less and occasionally completely refusing meals.  Palliative Symptoms:  Shortness of breath Agitation  GOC/Advance Directives Discussions:  A  detailed discussion regarding advanced directives was had.  We spoke of patient's goals and wishes.  Patient's wife states that their main goal is for him to be comfortable and have good quality of life for the time he has left.  They do wish to avoid hospitalizations.  His desire (and hers) is for him to pass comfortably and peacefully at home. She even mentions that she has told the dialysis center not to call 911 but to call her first if they have a concern as she knows he does not do well in the hospital and does not like being here.   Discussed the disease trajectory is of both dementia and end-stage renal disease.  Educated her about how having both of these conditions can accelerate decline.  Shared my concern that he is in the final stages of dementia.  I also shared my concerns regarding his ongoing ability to continue to tolerate dialysis (from a physical standpoint as well as from a cognitive/behavioral standpoint related to his dementia).  She readily acknowledges this and knows there will be a day sooner versus later that even if he is cognitively willing to  go to dialysis, that he will be physically unable to tolerate it.  We discussed the signs and symptoms of inability to tolerate dialysis (i.e. Development of symptoms such as hypotension, tachycardia, lightheadedness, chest pain, shortness of breath, N/V, and profound weakness with or immediately after sessions) as well as end-stage dementia signs.    She shares her worry however about him fully stopping dialysis as she knows this will accelerate his progression through end-of-life.  She also feels he continues to tolerate dialysis physically at this point.  Confirmed he is a DNR/DNI.  She understands that he does not have much time left.  She wants him to just be comfortable and happy.  Given goals of care and prognosis, I recommended hospice level of care.  Reviewed hospice philosophy of care, including focus on comfort, quality of life, and  support for patients and families facing terminal illness. Discussed eligibility criteria and potential benefits of enrollment. We did discuss that in order to be eligible for most hospice agencies he would have to be no longer attending dialysis.  She feels that their philosophy and goals are aligned with patient's desires and goals and would be interested in their support and services, however, she worries about stopping dialysis and feels he still doing okay with it.  She also shares how badly he feels when he misses dialysis and that she does not want him to stop dialysis and then suffer. Educated on how medication can be used to treat symptoms and promote comfort once dialysis is stopped.    Ultimately, we decided to see how he did with dialysis tonight.  If he is unable to tolerate dialysis, then we will stop all future dialysis sessions and transition to comfort care only and d/c home with hospice.  Discussed the importance of continued conversation with family and the medical providers regarding overall plan of care and treatment options, ensuring decisions are within the context of the patient's values and GOCs.   Questions and concerns were addressed.  The family was encouraged to call with questions or concerns.  PMT will continue to support holistically.  I spent 35 minutes providing separately identifiable ACP services with the patient and/or surrogate decision maker in a voluntary, in-person conversation discussing the patient's wishes and goals as detailed in the above note.   Code Status:  DNR/DNI   OTHER (wife, [name] Barton)    SUMMARY OF RECOMMENDATIONS    Continue DNR/DNI Continue safety sitter Complete inpatient dialysis and assess tolerance after session Pending tolerance of dialysis consider discharge home with hospice vs OP Palliative Patient palliative team will continue to follow for ongoing goals of care discussion and symptom management.  Code  Status/Advance Care Planning: DNR   Symptom Management:   Continue Seroquel  25 mg p.o. twice daily scheduled Continue Tylenol  as needed (none needed on 24-hour look back) Continue albuterol  nebulizers (given x 1) Continue Haldol  2 mg IM every 6 hours as needed (none needed on 24-hour look back) Continue Zofran  4 mg every 6 hours as needed (none needed on 24-hour look back)   Prognosis:  < 6 months  Discharge Planning: To Be Determined      Primary Diagnoses: Present on Admission:  Acute respiratory failure with hypoxia [facility])    Physical Exam Constitutional:      General: He is not in acute distress.    Appearance: He is not toxic-appearing.  Pulmonary:     Effort: Pulmonary effort is normal. No respiratory distress.  Skin:  General: Skin is warm and dry.  Neurological:     Mental Status: He is alert.     Comments: Calm and cooperative at present but confused.      Vital Signs: BP (!) 130/92   Pulse 90   Temp (!) 97.4 F (36.3 C) (Oral)   Resp 20   Ht 6' 2 (1.88 m)   Wt 96.6 kg   SpO2 100%   BMI 27.34 kg/m  Pain Scale: 0-10   Pain Score: Asleep   SpO2: SpO2: 100 % O2 Device:SpO2: 100 % O2 Flow Rate: .O2 Flow Rate (L/min): 8 L/min (During breathing treatment)   Palliative Assessment/Data: 40%    Billing based on MDM: High  Problems Addressed: One acute or chronic illness or injury that poses a threat to life or bodily function  Amount and/or Complexity of Data: Category 1:Assessment requiring an independent historian(s), Category 2:Independent interpretation of a test performed by another physician/other qualified health care professional (not separately reported), and Category 3:Discussion of management or test interpretation with external physician/other qualified health care professional/appropriate source (not separately reported)  Risks: Decision to continue with dialysis or not   Ronald CHRISTELLA Pinal, NP  Palliative Medicine Team Team  phone # [phone]  Thank you for allowing the Palliative Medicine Team to assist in the care of this patient. Please utilize secure chat with additional questions, if there is no response within 30 minutes please call the above phone number.  Palliative Medicine Team providers are available by phone from 7am to 7pm daily and can be reached through the team cell phone.  Should this patient require assistance outside of these hours, please call the patient's attending physician.

## 2023-09-25 DIAGNOSIS — E8770 Fluid overload, unspecified: Secondary | ICD-10-CM | POA: Diagnosis not present

## 2023-09-25 DIAGNOSIS — J441 Chronic obstructive pulmonary disease with (acute) exacerbation: Secondary | ICD-10-CM | POA: Diagnosis not present

## 2023-09-25 DIAGNOSIS — K219 Gastro-esophageal reflux disease without esophagitis: Secondary | ICD-10-CM

## 2023-09-25 DIAGNOSIS — N289 Disorder of kidney and ureter, unspecified: Secondary | ICD-10-CM

## 2023-09-25 DIAGNOSIS — F03918 Unspecified dementia, unspecified severity, with other behavioral disturbance: Secondary | ICD-10-CM

## 2023-09-25 DIAGNOSIS — I482 Chronic atrial fibrillation, unspecified: Secondary | ICD-10-CM

## 2023-09-25 DIAGNOSIS — J9601 Acute respiratory failure with hypoxia: Secondary | ICD-10-CM | POA: Diagnosis not present

## 2023-09-25 LAB — HEPATITIS B SURFACE ANTIBODY, QUANTITATIVE: Hep B S AB Quant (Post): 4.6 m[IU]/mL — ABNORMAL LOW

## 2023-09-25 LAB — RENAL FUNCTION PANEL
Albumin: 2.7 g/dL — ABNORMAL LOW (ref 3.5–5.0)
Anion gap: 17 — ABNORMAL HIGH (ref 5–15)
BUN: 39 mg/dL — ABNORMAL HIGH (ref 8–23)
CO2: 22 mmol/L (ref 22–32)
Calcium: 7.9 mg/dL — ABNORMAL LOW (ref 8.9–10.3)
Chloride: 95 mmol/L — ABNORMAL LOW (ref 98–111)
Creatinine, Ser: 6.27 mg/dL — ABNORMAL HIGH (ref 0.61–1.24)
GFR, Estimated: 8 mL/min — ABNORMAL LOW (ref >60)
Glucose, Bld: 116 mg/dL — ABNORMAL HIGH (ref 70–99)
Phosphorus: 6.2 mg/dL — ABNORMAL HIGH (ref 2.5–4.6)
Potassium: 4.5 mmol/L (ref 3.5–5.1)
Sodium: 134 mmol/L — ABNORMAL LOW (ref 135–145)

## 2023-09-25 MED ORDER — BREZTRI AEROSPHERE 160-9-4.8 MCG/ACT IN AERO: 2.0000 | INHALATION_SPRAY | Freq: Two times a day (BID) | RESPIRATORY_TRACT | 3 refills | Status: AC

## 2023-09-25 MED ORDER — QUETIAPINE FUMARATE 25 MG PO TABS: 12.5000 mg | ORAL_TABLET | Freq: Every day | ORAL | 2 refills | Status: AC

## 2023-09-25 MED ORDER — DOXYCYCLINE HYCLATE 100 MG PO TABS: 100.0000 mg | ORAL_TABLET | Freq: Two times a day (BID) | ORAL | 0 refills | Status: AC

## 2023-09-25 MED ORDER — DM-GUAIFENESIN ER 30-600 MG PO TB12: 1.0000 | ORAL_TABLET | Freq: Two times a day (BID) | ORAL | 0 refills | Status: AC

## 2023-09-25 MED ORDER — PENTAFLUOROPROP-TETRAFLUOROETH EX AERO: 1.0000 | INHALATION_SPRAY | CUTANEOUS | Status: DC | PRN

## 2023-09-25 MED ORDER — PREDNISONE 20 MG PO TABS
ORAL_TABLET | ORAL | 0 refills | Status: AC
Start: 2023-09-25 — End: ?

## 2023-09-25 MED ORDER — QUETIAPINE FUMARATE 25 MG PO TABS: 25.0000 mg | ORAL_TABLET | Freq: Every day | ORAL | Status: DC

## 2023-09-25 MED ORDER — ALBUTEROL SULFATE HFA 108 (90 BASE) MCG/ACT IN AERS: 2.0000 | INHALATION_SPRAY | Freq: Four times a day (QID) | RESPIRATORY_TRACT | 2 refills | Status: AC | PRN

## 2023-09-25 MED ORDER — MIDODRINE HCL 10 MG PO TABS: 5.0000 mg | ORAL_TABLET | Freq: Three times a day (TID) | ORAL | 2 refills | Status: AC

## 2023-09-25 MED ORDER — PANTOPRAZOLE SODIUM 40 MG PO TBEC
DELAYED_RELEASE_TABLET | ORAL | 0 refills | Status: AC
Start: 2023-09-25 — End: ?

## 2023-09-25 NOTE — TOC Transition Note (Signed)
 Transition of Care [facility]) - Discharge Note   Patient Details  Name: Ronald [name] MRN: [id number] Date of Birth: 02-14-1940  [facility] of [facility]) CM/SW Contact:  Sharlyne Stabs, RN Phone Number: 09/25/2023, 3:17 PM   Clinical Narrative:   [place] and TOC has been working to see if patient could continue HD and be admitted to hospice. Just confirmed [facility] could not admit under hospice with HD. They could would have to do the same a [facility] and admit under a different terminal diagnosis.  DC plan to go home with Outpatient Palliative with [facility] and Rosina will follow. Team updated.     Final next level of care: Home/Self Care Barriers to Discharge: Barriers Resolved   Patient Goals and CMS Choice Patient states their goals for this hospitalization and ongoing recovery are:: Wife wants pt to return home          Discharge Placement                    Patient and family notified of of transfer: 09/25/23  Discharge Plan and Services Additional resources added to the After Visit Summary for   In-house Referral: Clinical Social Work Discharge Planning Services: CM Consult                        HH Agency: Other - See comment Date HH Agency Contacted: 09/25/23 Time HH Agency Contacted: 1000 Representative spoke with at [facility] Agency: Rosina  Social Drivers of Health (SDOH) Interventions SDOH Screenings   Food Insecurity: No Food Insecurity (09/23/2023)  Recent Concern: Food Insecurity - Food Insecurity Present (08/12/2023)  Housing: Low Risk  (09/23/2023)  Transportation Needs: No Transportation Needs (09/23/2023)  Utilities: Patient Unable To Answer (09/23/2023)  Depression (PHQ2-9): High Risk (09/21/2023)  Financial Resource Strain: Low Risk  (07/07/2023)  Physical Activity: Inactive (07/07/2023)  Social Connections: Unknown (09/23/2023)  Stress: No Stress Concern Present (07/07/2023)  Tobacco Use: High Risk (09/23/2023)  Health Literacy: Inadequate  Health Literacy (08/06/2023)     Readmission Risk Interventions    09/24/2023    5:39 PM 07/02/2023   10:31 AM 06/23/2023    3:01 PM  Readmission Risk Prevention Plan  Transportation Screening Complete Complete Complete  HRI or Home Care Consult   Complete  Social Work Consult for Recovery Care Planning/Counseling   Complete  Palliative Care Screening   Not Applicable  Medication Review Oceanographer) Complete Complete Complete  PCP or Specialist appointment within 3-5 days of discharge Complete Complete   HRI or Home Care Consult Complete Complete   SW Recovery Care/Counseling Consult Complete    Palliative Care Screening Not Applicable    Skilled Nursing Facility Not Applicable

## 2023-09-25 NOTE — TOC Progression Note (Signed)
 Transition of Care [facility]) - Progression Note    Patient Details  Name: Ronald [name] MRN: [id number] Date of Birth: Dec 14, 1940  [facility] CM/SW Contact  Sharlyne Stabs, RN Phone Number: 09/25/2023, 1:01 PM  Clinical Narrative:   Palliative consulted. Continuing to discuss Hospice with patient and family. Rosina with [facility] has been following the patient and wife is familiar with Rosina. Rosina is on site to see the patient and family.  They will continue discussion. TOC following.     Expected Discharge Plan: Home/Self Care Barriers to Discharge: Continued Medical Work up               Expected Discharge Plan and Services In-house Referral: Clinical Social Work Discharge Planning Services: CM Consult   Living arrangements for the past 2 months: Single Family Home                                       Social Drivers of Health (SDOH) Interventions SDOH Screenings   Food Insecurity: No Food Insecurity (09/23/2023)  Recent Concern: Food Insecurity - Food Insecurity Present (08/12/2023)  Housing: Low Risk  (09/23/2023)  Transportation Needs: No Transportation Needs (09/23/2023)  Utilities: Patient Unable To Answer (09/23/2023)  Depression (PHQ2-9): High Risk (09/21/2023)  Financial Resource Strain: Low Risk  (07/07/2023)  Physical Activity: Inactive (07/07/2023)  Social Connections: Unknown (09/23/2023)  Stress: No Stress Concern Present (07/07/2023)  Tobacco Use: High Risk (09/23/2023)  Health Literacy: Inadequate Health Literacy (08/06/2023)    Readmission Risk Interventions    09/24/2023    5:39 PM 07/02/2023   10:31 AM 06/23/2023    3:01 PM  Readmission Risk Prevention Plan  Transportation Screening Complete Complete Complete  HRI or Home Care Consult   Complete  Social Work Consult for Recovery Care Planning/Counseling   Complete  Palliative Care Screening   Not Applicable  Medication Review Oceanographer) Complete Complete Complete   PCP or Specialist appointment within 3-5 days of discharge Complete Complete   HRI or Home Care Consult Complete Complete   SW Recovery Care/Counseling Consult Complete    Palliative Care Screening Not Applicable    Skilled Nursing Facility Not Applicable

## 2023-09-25 NOTE — Progress Notes (Addendum)
 Pt contacted by nephrology to investigate if pt could receive op HD tomorrow instead of in hospital toda on day of d/c. Contacted op HD clinic and they had a 1015am chair time available. Contacted pt wife with pt also on the phone to see if this would be manageable. Pt and wife agreed, wife will take him.   Pt now has a 1015 op HD appt tomorrow at [facility], clinic aware. Will update AVS at this time.  Ronald [name] Dialysis Navigator [phone]  Late note entry- Addendum 4:06 D/c summary and last nephrology note faxed to clinic.

## 2023-09-25 NOTE — Progress Notes (Signed)
 Daily Progress Note   Patient Name: Ronald [name]       Date: 09/25/2023 DOB: 1940-03-21  Age: 83 y.o. MRN#: [id number] Attending Physician: Ricky Fines, MD Primary Care Physician: Shona Norleen PEDLAR, MD Admit Date: 09/23/2023  Reason for Consultation/Follow-up: Establishing goals of care  Subjective:   83 y.o. male  with medical history significant for hypertension, hyperlipidemia, prostate cancer, ESRD on dialysis, HFmrEF, dementia, GERD, AAA status post repair, and tobacco abuse admitted on 09/23/2023 with acute hypoxic respiratory failure, COPD exacerbation, and fluid overload secondary to end-stage renal disease.    Patient presented to the ED from dialysis for cough and shortness of breath.  He was subsequently admitted to the unit for above dx.  Patient has advanced dementia with significant behavioral disturbances.  He is a dialysis patient but apparently has been missing several dialysis sections due to not feeling well. He has had 4 acute care setting encounters in the past 6 months for various issues.   09/24/2023: Long goals of care discussion, confirmed DNR/DNI status.  Goals of care comfort/quality of life/avoid hospitalizations.  Plan is to see how he tolerates dialysis and if able to tolerate dialysis then we will consider outpatient palliative referral to follow closely if unable to tolerate dialysis we will plan to refer to hospice.  Today, lab work reviewed.  Potassium is normalized.  Remains mildly hyponatremic although minimally so with a sodium of 134.  Renal function continues to be elevated consistent with end-stage renal disease.  CO2 level normalized.  Albumin  low at 2.7.  Vital signs appear to have been stable.  No as needed behavioral meds needed overnight.  Continues to have a Recruitment consultant.  Per nursing notes,  patient tolerated dialysis reasonably well although required frequent reorientation and redirection for behavioral concerns.  He did have to in the session about 12 minutes early to safety concerns.  Patient denies any concerns or complaints.  He states he is doing well.    Independent history obtained from patient's wife and nursing staff due to patient's baseline cognitive status.  Nursing staff state that patient is calm and cooperative at this time.  He did have some behaviors last night and during dialysis.  They share that he is calm and cooperative during the day but has difficulty with sundowning and during the night.  They also note that he has had very poor p.o. intake at 0 to 25%.  He does ambulate with the assistance of a rolling walker and standby assist.  Today, extensive goals of care and anticipatory care planning discussion held with wife.  We discussed his  previously expressed goals of care of comfort, maintaining quality of life, and avoiding hospitalizations.  We discussed while he was able to do dialysis yesterday, there will come a day and likely a day sooner versus later that patient's body will no longer be able to physically tolerate dialysis.  We also discussed the impact his cognition has on the safety of ongoing dialysis.  However, patient's wife would like to continue dialysis stating that he seems to look and feel so much better today.  She is well aware that his prognosis is limited and that soon he will not be able to tolerate dialysis.  We also discussed how end-stage renal disease and dialysis impact his dementia.  Reviewed hospice philosophy of care, including focus on comfort, quality of life, symptom management, and support for patients and families facing terminal illness. Discussed eligibility criteria and potential benefits of enrollment.  We also discussed limitations of enrollment (including concern that he may not be able to enroll due to ongoing pursuit of  dialysis).  Also discussed outpatient palliative care, benefits, and enrollment criteria.  Patient's wife expressed interest in attempting to enroll in hospice if able but follow-up with outpatient palliative if not.    Chart review/care coordination:  Completed extensive chart review including EPIC notes, labs, vital signs, medication administration record. Coordinated care directly with attending physician, TOC, hospice liaison, and bedside nursing team.   Length of Stay: 2   Physical Exam Constitutional:      General: He is not in acute distress.    Appearance: He is not toxic-appearing.  Pulmonary:     Effort: Pulmonary effort is normal. No respiratory distress.  Skin:    General: Skin is warm and dry.  Neurological:     Mental Status: He is alert.     Comments: Confused, calm and cooperative at present, does get easily irritated with wife             Vital Signs: BP 112/77 (BP Location: Right Arm)   Pulse 96   Temp 97.7 F (36.5 C) (Tympanic)   Resp 16   Ht 6' 2 (1.88 m)   Wt 93.1 kg   SpO2 98%   BMI 26.36 kg/m  SpO2: SpO2: 98 % O2 Device: O2 Device: Room Air O2 Flow Rate: O2 Flow Rate (L/min): 8 L/min (During breathing treatment)      Palliative Assessment/Data: 40-50%   Palliative Care Assessment & Plan   Patient Profile/Assessment:  83 year old male sent to the hospital from dialysis for acute hypoxic respiratory failure in the setting of COPD exacerbation and hypervolemia.  Patient has had numerous physical and cognitive declines over the past few months.  He has had increased difficulty tolerating dialysis due to behaviors and has also been intermittently refusing.  Extensive goals of care discussion and anticipatory care planning completed.  Goals of care comfort, quality of life, and avoid hospitalizations.  Tolerated dialysis while inpatient.  Wife and patient wish to continue with dialysis for the time being.  Discussed hospice versus outpatient palliative  referral.  I feel patient would benefit greatly from hospice level of care given his high symptom burden, limited prognosis, progressing dementia, increased difficulty tolerating dialysis, and would benefit from close monitoring of hospice versus outpatient palliative referral.  I was able to collaborate with community hospice attending.  Unfortunately, patient does not meet admission criteria to hospice due to desire to continue to proceed with dialysis.  Additional referral sent to an alternative hospice with no response.  Patient and family desire for him to be discharged home.  Will refer to outpatient palliative for follow-up.  Hospice liaison will arrange urgent palliative follow-up and patient's wife is aware.  She plans to reach out to the palliative team in the event they decide to no longer pursue dialysis.  We also discussed patient's behaviors and insomnia.  We discussed that he was recently prescribed Seroquel  for behaviors.  Given behaviors are stable during the day and worse at night so will switch Seroquel  to 25 mg nightly (consulted pharmacy regarding this and they felt that would be appropriate). Also discussed risks vs benefits/BBW for antipsycotics in dementia. Wife agrees benefit > risk at this point and would like to try.  Will write for Seroquel  25 mg nightly.  She is thankful for the time of the discussion.  Recommendations/Plan: Continue DNR/DNI Discharge home with family support Family wishes to continue dialysis for as long as able Adjust medication regimen to address nocturnal behaviors and insomnia Urgent outpatient palliative referral with low threshold to transition to hospice care upon decision to stop dialysis   Symptom management:  Continue Tylenol  as needed Change Seroquel  from 12.5 mg p.o. twice daily to 25 mg nightly  Prognosis:  < 6 months    Discharge Planning: Home with Palliative Services    Detailed review of medical records (labs, imaging, vital  signs), medically appropriate exam, discussed with treatment team, counseling and education to patient, family, & staff, documenting clinical information, medication management, coordination of care   Billing based on MDM: High  Problems Addressed: One acute or chronic illness or injury that poses a threat to life or bodily function  Amount and/or Complexity of Data: Category 1:Assessment requiring an independent historian(s) and Category 3:Discussion of management or test interpretation with external physician/other qualified health care professional/appropriate source (not separately reported)  Risks: Decision to continue dialysis or not, and decision to pursue hospice vs OP palliative         Laymon CHRISTELLA Pinal, NP  Palliative Medicine Team Team phone # [phone]  Thank you for allowing the Palliative Medicine Team to assist in the care of this patient. Please utilize secure chat with additional questions, if there is no response within 30 minutes please call the above phone number.  Palliative Medicine Team providers are available by phone from 7am to 7pm daily and can be reached through the team cell phone.  Should this patient require assistance outside of these hours, please call the patient's attending physician.

## 2023-09-25 NOTE — Progress Notes (Signed)
 Ronald [name] [address] [facility] Liaison Note:  Notified by [facility] manager of patient/family request for [facility] Palliative services at home after discharge.   Please call with any hospice or outpatient palliative care related questions.   Thank you for the opportunity to participate in this patient's care.   Elouise Husband, BSN, RN, OCN [employer] [phone]

## 2023-09-25 NOTE — Discharge Summary (Signed)
 Physician Discharge Summary   Patient: Ronald [name] MRN: [id number] DOB: 02/22/1940  Admit date:     09/23/2023  Discharge date: 09/25/23  Discharge Physician: Eric Nunnery   PCP: Ronald Norleen PEDLAR, MD   Recommendations at discharge:  Continue advance care planning and goals of care discussion Repeat CBC to follow hemoglobin trend/stability. Reassess blood pressure and further adjust medication as needed. Continue to follow patient's clinical response to the use of Seroquel  regarding behavioral management associated with his dementia.   Discharge Diagnoses: Principal Problem:   Acute hypoxic respiratory failure (HCC) Active Problems:   COPD exacerbation (HCC)   Gastroesophageal reflux disease   Hypervolemia associated with renal insufficiency   Dementia with behavioral disturbance (HCC)   Chronic atrial fibrillation (HCC)  Brief hospital admission narrative: Ronald [name] is a 83 y.o. male with medical history significant of hypertension, hyperlipidemia, prostate cancer, ESRD, diastolic heart failure, dementia, GERD and tobacco abuse; who presented from dialysis center secondary to worsening shortness of breath and hypoxia.  Patient's wife reporting URI symptoms for the last 4 days or so that have worsened up on the day of admission.  A dialysis patient had 1.5 hours left for treatment.  Folded in a second supplementation in place; mildly productive intermittent coughing spells reported.  No fever, no nausea, no vomiting, no chest pain, no dysuria, no hematuria, no focal weaknesses or any other complaints. Workup in the ED demonstrating no frank infiltrates, positive vascular congestion and diffuse wheezing on examination with bibasilar crackles.   Nebulizer treatment, steroids, Lasix  and IV antibiotics given at time of admission; cultures taken.  TRH contacted to place patient in the hospital for further evaluation and management.  Assessment and Plan: 1-acute respiratory  failure with hypoxia - Appears to be secondary mainly to COPD exacerbation, with maybe mild component of vascular congestion and volume overload from yesterday.  - Excellent response to bronchodilator management and his steroids; patient discharged home on steroids tapering, as needed albuterol  and resumption of maintenance bronchodilator management using Beztri. - Patient will also complete treatment of oral antibiotics, mucolytic's and continue the use of flutter valve. -No oxygen requirement needed to maintain saturation on room air.   2-ESRD/hyperkalemia - Acute treatment tolerated on 09/24/2023; planning for extra treatment as an outpatient at the be the [place] dialysis center on 09/26/2023. - Appreciate assistance and recommendation by nephrology service. - Patient to continue previous scheduled dialysis treatments on Monday-Wednesday and Friday; next anticipated treatment after tomorrow station 09/28/2023. - Electrolytes within normal limits at discharge.   3-chronic anemia -In the setting of renal failure - No overt bleeding appreciated - Continue to follow hemoglobin trend - Epogen therapy and IV iron  as per nephrology discretion.   4-advanced dementia - Continue supportive care and constant reorientation - Sitter at bedside - Continue the use of Seroquel  (low-dose nightly). - Overnight severely aggressive/agitated and refusing therapy. - Currently improved and allowing nursing staff to care for him and provide medications - Palliative care follow-up as an outpatient will be arranged.   5-tobacco abuse - Cessation counseling provided - Continue nicotine  patch.   6-prior history of stroke and A-fib. - Continue secondary prevention with Plavix  -Continue rate control management with the use of metoprolol . - Continue risk factor modifications.  Consultants: Nephrology service and palliative care Procedures performed: See below for x-ray reports. Disposition: Home Diet  recommendation: Heart healthy/low-sodium diet.  DISCHARGE MEDICATION: Allergies as of 09/25/2023   No Known Allergies      Medication List  STOP taking these medications    amiodarone  200 MG tablet Commonly known as: PACERONE    atorvastatin  80 MG tablet Commonly known as: LIPITOR       TAKE these medications    acetaminophen  500 MG tablet Commonly known as: TYLENOL  Take 1,000 mg by mouth every 8 (eight) hours as needed for mild pain (pain score 1-3).   albuterol  108 (90 Base) MCG/ACT inhaler Commonly known as: VENTOLIN  HFA Inhale 2 puffs into the lungs every 6 (six) hours as needed for wheezing or shortness of breath.   Breztri  Aerosphere 160-9-4.8 MCG/ACT Aero inhaler Generic drug: budesonide -glycopyrrolate -formoterol Inhale 2 puffs into the lungs 2 (two) times daily.   clopidogrel  75 MG tablet Commonly known as: PLAVIX  Take 75 mg by mouth daily.   dextromethorphan -guaiFENesin  30-600 MG 12hr tablet Commonly known as: MUCINEX  DM Take 1 tablet by mouth 2 (two) times daily.   doxycycline  100 MG tablet Commonly known as: VIBRA -TABS Take 1 tablet (100 mg total) by mouth every 12 (twelve) hours for 5 days.   LORazepam  1 MG tablet Commonly known as: ATIVAN  Take 1 mg by mouth at bedtime as needed for sleep.   metoprolol  succinate 50 MG 24 hr tablet Commonly known as: TOPROL -XL Take 50 mg by mouth daily. TAKE WITH OR IMMEDIATELY FOLLOWING A MEAL.   midodrine  10 MG tablet Commonly known as: PROAMATINE  Take 0.5 tablets (5 mg total) by mouth 3 (three) times daily. What changed:  how much to take when to take this reasons to take this   pantoprazole  40 MG tablet Commonly known as: PROTONIX  Take 1 tablet (40 mg) by mouth daily.   predniSONE  20 MG tablet Commonly known as: DELTASONE  Take 3 tablets by mouth daily x 1 day; then 2 tablets by mouth daily x 2 days; then 1 tablet by mouth daily x 3 days; then half tablet by mouth daily x 3 days of  methylprednisolone .   QUEtiapine  25 MG tablet Commonly known as: SEROQUEL  Take 0.5 tablets (12.5 mg total) by mouth at bedtime.   tamsulosin  0.4 MG Caps capsule Commonly known as: FLOMAX  Take 1 capsule (0.4 mg total) by mouth daily after supper.        Follow-up Information     Dialysis, [facility]. Go on 09/26/2023.   Why: Please be at [facility] on 8/16 at 10am for dialysis. Contact information: [address] [place] [facility] [address] [phone]         Ronald Norleen PEDLAR, MD. Schedule an appointment as soon as possible for a visit in 10 day(s).   Specialty: Internal Medicine Contact information: [address] [place] [address] [phone]                Discharge Exam: Filed Weights   09/23/23 1657 09/25/23 0419  Weight: 96.6 kg 93.1 kg    General exam: Alert, awake, oriented x 1; following commands appropriately.  In no acute distress. Respiratory system: Mild expiratory wheezing and positive rhonchi appreciated; not using accessory muscle.  Good saturation on room air. Cardiovascular system: Rate controlled, no rubs, no gallops, no JVD. Gastrointestinal system: Abdomen is nondistended, soft and nontender. No organomegaly or masses felt. Normal bowel sounds heard. Central nervous system: Moving 4 limbs spontaneously.  No focal neurological deficits. Extremities: No cyanosis or clubbing; 1-2+ edema appreciated bilaterally. Skin: No petechiae. Psychiatry: Judgement and insight appear impaired secondary to underlying dementia; flat affect on exam.  Condition at discharge: Stable and improved.  The results of significant diagnostics from this  hospitalization (including imaging, microbiology, ancillary and laboratory) are listed below for reference.   Imaging Studies: DG Chest Port 1 View if patient is in a treatment room. Result Date: 09/23/2023 CLINICAL DATA:  Dyspnea.  Concern for sepsis. EXAM: PORTABLE CHEST 1 VIEW COMPARISON:   07/09/2023. FINDINGS: Stable cardiomegaly. Unchanged median sternotomy. Pulmonary vascular congestion with diffuse bilateral interstitial prominence, increased since the prior exam, suggestive of interstitial pulmonary edema, although, atypical infection could have a similar appearance in the appropriate clinical setting. Suspected small bilateral pleural effusions. No pneumothorax. No acute osseous abnormality. IMPRESSION: 1. Cardiomegaly with pulmonary vascular congestion and diffuse bilateral interstitial prominence, increased since the prior exam, suggestive of interstitial pulmonary edema, although, atypical infection could have a similar appearance in the appropriate setting. 2. Suspected small bilateral pleural effusions. Electronically Signed   By: Harrietta Sherry M.D.   On: 09/23/2023 15:57    Microbiology: Results for orders placed or performed during the hospital encounter of 09/23/23  Culture, blood (Routine x 2)     Status: None (Preliminary result)   Collection Time: 09/23/23  2:17 PM   Specimen: BLOOD  Result Value Ref Range Status   Specimen Description BLOOD BLOOD RIGHT ARM  Final   Special Requests   Final    BOTTLES DRAWN AEROBIC AND ANAEROBIC Blood Culture results may not be optimal due to an inadequate volume of blood received in culture bottles   Culture   Final    NO GROWTH 2 DAYS Performed at [facility], [address], [place], [place] [address]    Report Status PENDING  Incomplete  Culture, blood (Routine x 2)     Status: None (Preliminary result)   Collection Time: 09/23/23  2:17 PM   Specimen: BLOOD  Result Value Ref Range Status   Specimen Description BLOOD BLOOD RIGHT HAND  Final   Special Requests   Final    BOTTLES DRAWN AEROBIC AND ANAEROBIC Blood Culture results may not be optimal due to an inadequate volume of blood received in culture bottles   Culture   Final    NO GROWTH 2 DAYS Performed at [facility], [address], [place], [place] [address]     Report Status PENDING  Incomplete    Labs: CBC: Recent Labs  Lab 09/23/23 1417 09/23/23 1548 09/24/23 0422  WBC 3.3* 3.4* 2.8*  NEUTROABS 1.8  --   --   HGB 9.4* 9.1* 9.7*  HCT 30.0* 28.9* 31.7*  MCV 100.7* 100.0 101.0*  PLT 138* 132* 136*   Basic Metabolic Panel: Recent Labs  Lab 09/23/23 1417 09/23/23 1548 09/24/23 0422 09/24/23 1201 09/24/23 2104 09/25/23 0443  NA 134*  --  133*  --  133* 134*  K 4.8  --  6.3* 5.3* 5.2* 4.5  CL 93*  --  96*  --  96* 95*  CO2 23  --  19*  --  17* 22  GLUCOSE 79  --  136*  --  139* 116*  BUN 44*  --  56*  --  62* 39*  CREATININE 7.59* 8.00* 9.15*  --  9.94* 6.27*  CALCIUM  7.7*  --  7.6*  --  7.9* 7.9*  PHOS  --   --   --   --  8.7* 6.2*   Liver Function Tests: Recent Labs  Lab 09/23/23 1417 09/24/23 2104 09/25/23 0443  AST 34  --   --   ALT 22  --   --   ALKPHOS 124  --   --  BILITOT 0.8  --   --   PROT 6.0*  --   --   ALBUMIN  2.9* 2.9* 2.7*   CBG: Recent Labs  Lab 09/24/23 0644 09/24/23 1545  GLUCAP 164* 161*    Discharge time spent:  35 minutes.  Signed: Eric Nunnery, MD [facility] 09/25/2023

## 2023-09-25 NOTE — Progress Notes (Signed)
 [facility] Progress Note   Subjective:   no new issues, doing better. Wife bedside and says his LE edema is much improved and he's in a good mood this AM.  She confirms she'll take him for outpt HD tomorrow  Objective Vitals:   09/24/23 2300 09/24/23 2326 09/24/23 2346 09/25/23 0419  BP: 115/83 120/76 115/72 112/77  Pulse: (!) 114 (!) 110 92 96  Resp: 20 16 17 16   Temp:   97.6 F (36.4 C) 97.7 F (36.5 C)  TempSrc:   Tympanic Tympanic  SpO2: 99% 99% 100% 98%  Weight:    93.1 kg  Height:       Physical Exam General: calm in bed Heart:RRR Lungs: no wheezing or rales this AM Extremities: 2+ tibial edema Dialysis Access:  L forearm AVF +t/b  Additional Objective Labs: Basic Metabolic Panel: Recent Labs  Lab 09/24/23 0422 09/24/23 1201 09/24/23 2104 09/25/23 0443  NA 133*  --  133* 134*  K 6.3* 5.3* 5.2* 4.5  CL 96*  --  96* 95*  CO2 19*  --  17* 22  GLUCOSE 136*  --  139* 116*  BUN 56*  --  62* 39*  CREATININE 9.15*  --  9.94* 6.27*  CALCIUM  7.6*  --  7.9* 7.9*  PHOS  --   --  8.7* 6.2*   Liver Function Tests: Recent Labs  Lab 09/23/23 1417 09/24/23 2104 09/25/23 0443  AST 34  --   --   ALT 22  --   --   ALKPHOS 124  --   --   BILITOT 0.8  --   --   PROT 6.0*  --   --   ALBUMIN  2.9* 2.9* 2.7*   No results for input(s): LIPASE, AMYLASE in the last 168 hours. CBC: Recent Labs  Lab 09/23/23 1417 09/23/23 1548 09/24/23 0422  WBC 3.3* 3.4* 2.8*  NEUTROABS 1.8  --   --   HGB 9.4* 9.1* 9.7*  HCT 30.0* 28.9* 31.7*  MCV 100.7* 100.0 101.0*  PLT 138* 132* 136*   Blood Culture    Component Value Date/Time   SDES BLOOD BLOOD RIGHT ARM 09/23/2023 1417   SDES BLOOD BLOOD RIGHT HAND 09/23/2023 1417   SPECREQUEST  09/23/2023 1417    BOTTLES DRAWN AEROBIC AND ANAEROBIC Blood Culture results may not be optimal due to an inadequate volume of blood received in culture bottles   SPECREQUEST  09/23/2023 1417    BOTTLES DRAWN AEROBIC AND  ANAEROBIC Blood Culture results may not be optimal due to an inadequate volume of blood received in culture bottles   CULT  09/23/2023 1417    NO GROWTH 2 DAYS Performed at [facility], [address] [place], [place] [address]    CULT  09/23/2023 1417    NO GROWTH 2 DAYS Performed at [facility], [address], [place], [place] [address]    REPTSTATUS PENDING 09/23/2023 1417   REPTSTATUS PENDING 09/23/2023 1417    Cardiac Enzymes: No results for input(s): CKTOTAL, CKMB, CKMBINDEX, TROPONINI in the last 168 hours. CBG: Recent Labs  Lab 09/24/23 0644 09/24/23 1545  GLUCAP 164* 161*   Iron  Studies: No results for input(s): IRON , TIBC, TRANSFERRIN, FERRITIN in the last 72 hours. @lablastinr3 @ Studies/Results: DG Chest Port 1 View if patient is in a treatment room. Result Date: 09/23/2023 CLINICAL DATA:  Dyspnea.  Concern for sepsis. EXAM: PORTABLE CHEST 1 VIEW COMPARISON:  07/09/2023. FINDINGS: Stable cardiomegaly. Unchanged median sternotomy. Pulmonary vascular congestion with  diffuse bilateral interstitial prominence, increased since the prior exam, suggestive of interstitial pulmonary edema, although, atypical infection could have a similar appearance in the appropriate clinical setting. Suspected small bilateral pleural effusions. No pneumothorax. No acute osseous abnormality. IMPRESSION: 1. Cardiomegaly with pulmonary vascular congestion and diffuse bilateral interstitial prominence, increased since the prior exam, suggestive of interstitial pulmonary edema, although, atypical infection could have a similar appearance in the appropriate setting. 2. Suspected small bilateral pleural effusions. Electronically Signed   By: Harrietta Sherry M.D.   On: 09/23/2023 15:57   Medications:   arformoterol   15 mcg Nebulization BID   budesonide  (PULMICORT ) nebulizer solution  0.5 mg Nebulization BID   Chlorhexidine  Gluconate Cloth  6 each Topical Q0600   Chlorhexidine  Gluconate  Cloth  6 each Topical Q0600   clopidogrel   75 mg Oral Daily   dextromethorphan -guaiFENesin   1 tablet Oral BID   doxycycline   100 mg Oral Q12H   heparin   5,000 Units Subcutaneous Q8H   methylPREDNISolone  (SOLU-MEDROL ) injection  40 mg Intravenous Q12H   midodrine   5 mg Oral TID WC   nicotine   14 mg Transdermal Daily   pantoprazole   40 mg Oral Daily   QUEtiapine   12.5 mg Oral BID   sodium chloride  flush  3 mL Intravenous Q12H   tamsulosin   0.4 mg Oral QPC supper    Assessment/PlanBogdan J Solum is an 83 y.o. male with hypertension, hyperlipidemia, prostate cancer, ESRD, diastolic heart failure, dementia, GERD and tobacco abuse currently admitted for hypoxia and nephrology is consulted for comanagement of ESRD, hypervolemia and hyperkalemia.    **AHRF:  multifactorial - COPD + volume.  COPD tx per primary.  Had UF 2.8L on 8/14.  Both issues improving - still has some volume on for outpt HD to work on - next tx tomorrow. Challenge EDW if needed.    **Hyperkalemia:  resolved  **ESRD on HD: MWF [facility] - partial tx Wed at outpt unit, had full tx Thursday, next Sat then Monday.    **AMS: dementia, often with delerium in hospital.  Currently has sitter.     **Anemia:  Hb 9s, defer ESA to outpt.      D/C today.   Manuelita Barters MD 09/25/2023, 10:56 AM  [facility] Pager: [phone]

## 2023-09-28 ENCOUNTER — Telehealth: Payer: Self-pay

## 2023-09-28 LAB — CULTURE, BLOOD (ROUTINE X 2)
Culture: NO GROWTH
Culture: NO GROWTH

## 2023-09-28 NOTE — Transitions of Care (Post Inpatient/ED Visit) (Signed)
   09/28/2023  Name: Ronald [name] MRN: [id number] DOB: Nov 16, 1940  Today's TOC FU Call Status: Today's TOC FU Call Status:: Successful TOC FU Call Completed Patient's Name and Date of Birth confirmed. Spoke with spouse Ronald [name], she states that patient passed away on 10/03/23.  Expressed my condolences.  She states she has family support at this time.  Advised I would let CCM RN know of passing.  She verbalized understanding.   Telephone call with CCM Nurse Suzen Griffiths, RN, notified her of patient passing.  She will be in contact with spouse.    Transition Care Management Follow-up Telephone Call Date of Discharge: 09/25/23 [facility] Type of Discharge: Inpatient Admission Primary Inpatient Discharge Diagnosis:: Acute hypoxic respiratory failure   [name] RN, MSN [facility]  [facility], [facility] RN Care Manager Direct Dial: [phone]  Fax: [phone] Website: [email or web address]

## 2023-10-05 ENCOUNTER — Other Ambulatory Visit: Payer: Self-pay | Admitting: *Deleted

## 2023-10-05 NOTE — Patient Outreach (Signed)
 Complex Care Management   Visit Note  10/05/2023  Name:  Ronald [name] MRN: [id number] DOB: 04/07/1940  Situation: Referral received for Complex Care Management related to ESRD I obtained verbal consent from Caregiver.  Visit completed with Caregiver  on the phone  Ronald [name] reports she is doing okay after Ronald [name] passed on 10-11-23 ~10 days ago  She reports receiving bills from EMS & pcp $117+  Her daughter & son has left  Wife is at home with her brother from [place]  She would like for RN CM to stay in touch with her and to help with a call to the insurance company about the pcp bills    Background:   Past Medical History:  Diagnosis Date   CHF (congestive heart failure) (HCC)    ESRD on hemodialysis (HCC)    GERD (gastroesophageal reflux disease)    Glaucoma    Hypertension    Macular degeneration    Myocardial infarction [facility])    1999   Prostate cancer (HCC) 02/2018   Stroke (HCC)    no deficits; had stroke while trying to place stents in legs.    Assessment: Patient Reported Symptoms:  Cognitive        Neurological      HEENT        Cardiovascular      Respiratory      Endocrine      Gastrointestinal        Genitourinary      Integumentary      Musculoskeletal          Psychosocial            10/05/2023    PHQ2-9 Depression Screening   Little interest or pleasure in doing things    Feeling down, depressed, or hopeless    PHQ-2 - Total Score    Trouble falling or staying asleep, or sleeping too much    Feeling tired or having little energy    Poor appetite or overeating     Feeling bad about yourself - or that you are a failure or have let yourself or your family down    Trouble concentrating on things, such as reading the newspaper or watching television    Moving or speaking so slowly that other people could have noticed.  Or the opposite - being so fidgety or restless that you have been moving around a lot more  than usual    Thoughts that you would be better off dead, or hurting yourself in some way    PHQ2-9 Total Score    If you checked off any problems, how difficult have these problems made it for you to do your work, take care of things at home, or get along with other people    Depression Interventions/Treatment      There were no vitals filed for this visit.  Medications Reviewed Today   Medications were not reviewed in this encounter     Recommendation:   None at this time  Follow Up Plan:   Closing From:  Complex Care Management   Aerielle Stoklosa L. Ramonita, RN, BSN, CCM [place]  [facility], [facility] RN Care Manager Direct Dial: [phone]  Fax: [phone]

## 2024-01-26 ENCOUNTER — Ambulatory Visit: Admitting: Vascular Surgery
